# Patient Record
Sex: Male | Born: 1940
Health system: Southern US, Community
[De-identification: ages and names within clinical notes are randomized; demographics above are authoritative.]

## PROBLEM LIST (undated history)

## (undated) DIAGNOSIS — C801 Malignant (primary) neoplasm, unspecified: Secondary | ICD-10-CM

## (undated) DIAGNOSIS — I10 Essential (primary) hypertension: Secondary | ICD-10-CM

## (undated) DIAGNOSIS — J9383 Other pneumothorax: Secondary | ICD-10-CM

## (undated) DIAGNOSIS — I2510 Atherosclerotic heart disease of native coronary artery without angina pectoris: Secondary | ICD-10-CM

## (undated) DIAGNOSIS — N189 Chronic kidney disease, unspecified: Secondary | ICD-10-CM

## (undated) DIAGNOSIS — Z951 Presence of aortocoronary bypass graft: Secondary | ICD-10-CM

## (undated) HISTORY — DX: Other pneumothorax: J93.83

## (undated) HISTORY — DX: Presence of aortocoronary bypass graft: Z95.1

## (undated) HISTORY — PX: APPENDECTOMY: SHX54

---

## 1991-05-18 HISTORY — PX: KNEE SURGERY: SHX244

## 1999-05-18 HISTORY — PX: EYE SURGERY: SHX253

## 2001-05-17 HISTORY — PX: CORONARY ARTERY BYPASS GRAFT: SHX141

## 2001-09-20 ENCOUNTER — Encounter: Payer: Self-pay | Admitting: *Deleted

## 2001-09-20 ENCOUNTER — Inpatient Hospital Stay (HOSPITAL_COMMUNITY): Admission: EM | Admit: 2001-09-20 | Discharge: 2001-09-27 | Payer: Self-pay | Admitting: Emergency Medicine

## 2001-09-20 ENCOUNTER — Emergency Department (HOSPITAL_COMMUNITY): Admission: EM | Admit: 2001-09-20 | Discharge: 2001-09-20 | Payer: Self-pay | Admitting: *Deleted

## 2001-09-21 ENCOUNTER — Encounter: Payer: Self-pay | Admitting: Thoracic Surgery (Cardiothoracic Vascular Surgery)

## 2001-09-22 ENCOUNTER — Encounter: Payer: Self-pay | Admitting: Thoracic Surgery (Cardiothoracic Vascular Surgery)

## 2001-09-22 DIAGNOSIS — Z951 Presence of aortocoronary bypass graft: Secondary | ICD-10-CM

## 2001-09-22 HISTORY — DX: Presence of aortocoronary bypass graft: Z95.1

## 2001-09-23 ENCOUNTER — Encounter: Payer: Self-pay | Admitting: Thoracic Surgery (Cardiothoracic Vascular Surgery)

## 2001-09-24 ENCOUNTER — Encounter: Payer: Self-pay | Admitting: Surgery

## 2001-10-10 ENCOUNTER — Encounter: Payer: Self-pay | Admitting: Cardiology

## 2001-10-10 ENCOUNTER — Ambulatory Visit (HOSPITAL_COMMUNITY): Admission: RE | Admit: 2001-10-10 | Discharge: 2001-10-10 | Payer: Self-pay | Admitting: Cardiology

## 2001-10-23 ENCOUNTER — Encounter (HOSPITAL_COMMUNITY): Admission: RE | Admit: 2001-10-23 | Discharge: 2001-11-22 | Payer: Self-pay | Admitting: Cardiology

## 2001-11-23 ENCOUNTER — Encounter (HOSPITAL_COMMUNITY): Admission: RE | Admit: 2001-11-23 | Discharge: 2001-12-23 | Payer: Self-pay | Admitting: Cardiology

## 2001-12-25 ENCOUNTER — Encounter (HOSPITAL_COMMUNITY): Admission: RE | Admit: 2001-12-25 | Discharge: 2002-01-24 | Payer: Self-pay | Admitting: Cardiology

## 2002-12-12 ENCOUNTER — Encounter: Payer: Self-pay | Admitting: Emergency Medicine

## 2002-12-12 ENCOUNTER — Emergency Department (HOSPITAL_COMMUNITY): Admission: EM | Admit: 2002-12-12 | Discharge: 2002-12-12 | Payer: Self-pay | Admitting: Emergency Medicine

## 2003-11-03 ENCOUNTER — Emergency Department (HOSPITAL_COMMUNITY): Admission: EM | Admit: 2003-11-03 | Discharge: 2003-11-03 | Payer: Self-pay | Admitting: Emergency Medicine

## 2003-11-03 ENCOUNTER — Inpatient Hospital Stay (HOSPITAL_COMMUNITY): Admission: EM | Admit: 2003-11-03 | Discharge: 2003-11-05 | Payer: Self-pay | Admitting: *Deleted

## 2003-11-22 ENCOUNTER — Ambulatory Visit (HOSPITAL_COMMUNITY): Admission: RE | Admit: 2003-11-22 | Discharge: 2003-11-22 | Payer: Self-pay | Admitting: Internal Medicine

## 2003-12-02 ENCOUNTER — Ambulatory Visit (HOSPITAL_COMMUNITY): Admission: RE | Admit: 2003-12-02 | Discharge: 2003-12-02 | Payer: Self-pay | Admitting: General Surgery

## 2005-06-16 ENCOUNTER — Ambulatory Visit: Payer: Self-pay | Admitting: Internal Medicine

## 2005-06-16 ENCOUNTER — Ambulatory Visit (HOSPITAL_COMMUNITY): Admission: RE | Admit: 2005-06-16 | Discharge: 2005-06-16 | Payer: Self-pay | Admitting: Internal Medicine

## 2006-05-17 HISTORY — PX: ANKLE FRACTURE SURGERY: SHX122

## 2006-05-17 HISTORY — PX: CHOLECYSTECTOMY: SHX55

## 2006-06-08 ENCOUNTER — Ambulatory Visit: Payer: Self-pay | Admitting: Cardiology

## 2006-06-08 ENCOUNTER — Inpatient Hospital Stay (HOSPITAL_COMMUNITY): Admission: EM | Admit: 2006-06-08 | Discharge: 2006-06-09 | Payer: Self-pay | Admitting: Emergency Medicine

## 2006-06-28 ENCOUNTER — Ambulatory Visit: Payer: Self-pay | Admitting: Cardiology

## 2007-05-05 ENCOUNTER — Inpatient Hospital Stay (HOSPITAL_COMMUNITY): Admission: EM | Admit: 2007-05-05 | Discharge: 2007-05-07 | Payer: Self-pay | Admitting: Emergency Medicine

## 2008-11-04 DIAGNOSIS — I209 Angina pectoris, unspecified: Secondary | ICD-10-CM | POA: Insufficient documentation

## 2008-11-04 DIAGNOSIS — I2510 Atherosclerotic heart disease of native coronary artery without angina pectoris: Secondary | ICD-10-CM

## 2008-11-04 DIAGNOSIS — E119 Type 2 diabetes mellitus without complications: Secondary | ICD-10-CM

## 2009-09-15 ENCOUNTER — Emergency Department (HOSPITAL_COMMUNITY): Admission: EM | Admit: 2009-09-15 | Discharge: 2009-09-15 | Payer: Self-pay | Admitting: Emergency Medicine

## 2010-05-17 HISTORY — PX: PROSTATE SURGERY: SHX751

## 2010-05-17 HISTORY — PX: TRANSURETHRAL RESECTION OF PROSTATE: SHX73

## 2010-06-25 ENCOUNTER — Other Ambulatory Visit (HOSPITAL_COMMUNITY): Payer: Self-pay | Admitting: Internal Medicine

## 2010-06-25 DIAGNOSIS — R748 Abnormal levels of other serum enzymes: Secondary | ICD-10-CM

## 2010-06-29 ENCOUNTER — Ambulatory Visit (HOSPITAL_COMMUNITY)
Admission: RE | Admit: 2010-06-29 | Discharge: 2010-06-29 | Disposition: A | Discharge status: Home / Self Care | Payer: MEDICARE | Source: Ambulatory Visit | Attending: Internal Medicine | Admitting: Internal Medicine

## 2010-06-29 DIAGNOSIS — R944 Abnormal results of kidney function studies: Secondary | ICD-10-CM | POA: Insufficient documentation

## 2010-06-29 DIAGNOSIS — N1330 Unspecified hydronephrosis: Secondary | ICD-10-CM | POA: Insufficient documentation

## 2010-06-29 DIAGNOSIS — R748 Abnormal levels of other serum enzymes: Secondary | ICD-10-CM

## 2010-08-03 ENCOUNTER — Ambulatory Visit (HOSPITAL_COMMUNITY)
Admission: RE | Admit: 2010-08-03 | Discharge: 2010-08-03 | Disposition: A | Discharge status: Home / Self Care | Payer: MEDICARE | Source: Ambulatory Visit | Attending: Urology | Admitting: Urology

## 2010-08-03 ENCOUNTER — Other Ambulatory Visit (HOSPITAL_COMMUNITY): Payer: Self-pay | Admitting: Urology

## 2010-08-03 DIAGNOSIS — N400 Enlarged prostate without lower urinary tract symptoms: Secondary | ICD-10-CM

## 2010-08-03 DIAGNOSIS — Z951 Presence of aortocoronary bypass graft: Secondary | ICD-10-CM | POA: Insufficient documentation

## 2010-08-03 DIAGNOSIS — Z01818 Encounter for other preprocedural examination: Secondary | ICD-10-CM | POA: Insufficient documentation

## 2010-08-07 ENCOUNTER — Ambulatory Visit (HOSPITAL_BASED_OUTPATIENT_CLINIC_OR_DEPARTMENT_OTHER)
Admission: RE | Admit: 2010-08-07 | Discharge: 2010-08-08 | Disposition: A | Discharge status: Home / Self Care | Payer: MEDICARE | Source: Ambulatory Visit | Attending: Urology | Admitting: Urology

## 2010-08-07 DIAGNOSIS — N401 Enlarged prostate with lower urinary tract symptoms: Secondary | ICD-10-CM | POA: Insufficient documentation

## 2010-08-07 DIAGNOSIS — R339 Retention of urine, unspecified: Secondary | ICD-10-CM | POA: Insufficient documentation

## 2010-08-07 DIAGNOSIS — E119 Type 2 diabetes mellitus without complications: Secondary | ICD-10-CM | POA: Insufficient documentation

## 2010-08-07 DIAGNOSIS — Z01810 Encounter for preprocedural cardiovascular examination: Secondary | ICD-10-CM | POA: Insufficient documentation

## 2010-08-07 DIAGNOSIS — N138 Other obstructive and reflux uropathy: Secondary | ICD-10-CM | POA: Insufficient documentation

## 2010-08-07 DIAGNOSIS — Z951 Presence of aortocoronary bypass graft: Secondary | ICD-10-CM | POA: Insufficient documentation

## 2010-08-07 DIAGNOSIS — Z87891 Personal history of nicotine dependence: Secondary | ICD-10-CM | POA: Insufficient documentation

## 2010-08-07 DIAGNOSIS — Z79899 Other long term (current) drug therapy: Secondary | ICD-10-CM | POA: Insufficient documentation

## 2010-08-07 DIAGNOSIS — Z01812 Encounter for preprocedural laboratory examination: Secondary | ICD-10-CM | POA: Insufficient documentation

## 2010-08-07 DIAGNOSIS — I2510 Atherosclerotic heart disease of native coronary artery without angina pectoris: Secondary | ICD-10-CM | POA: Insufficient documentation

## 2010-08-07 DIAGNOSIS — I10 Essential (primary) hypertension: Secondary | ICD-10-CM | POA: Insufficient documentation

## 2010-08-07 LAB — GLUCOSE, CAPILLARY
Glucose-Capillary: 83 mg/dL (ref 70–99)
Glucose-Capillary: 105 mg/dL — ABNORMAL HIGH (ref 70–99)

## 2010-08-07 LAB — POCT I-STAT 4, (NA,K, GLUC, HGB,HCT)
Glucose, Bld: 92 mg/dL (ref 70–99)
HCT: 36 % — ABNORMAL LOW (ref 39.0–52.0)
Hemoglobin: 12.2 g/dL — ABNORMAL LOW (ref 13.0–17.0)
Potassium: 5.3 mEq/L — ABNORMAL HIGH (ref 3.5–5.1)
Sodium: 141 mEq/L (ref 135–145)

## 2010-08-08 LAB — GLUCOSE, CAPILLARY
Glucose-Capillary: 151 mg/dL — ABNORMAL HIGH (ref 70–99)
Glucose-Capillary: 174 mg/dL — ABNORMAL HIGH (ref 70–99)

## 2010-08-25 NOTE — Op Note (Signed)
NAMEJOVANTE, HAMMITT NO.:  [id number]  MEDICAL RECORD NO.:  [id number]           PATIENT TYPE:  LOCATION:                                 FACILITY:  PHYSICIAN:  Caryl Fate C. Vernie Ammons, M.D.  DATE OF BIRTH:  August 27, 1940  DATE OF PROCEDURE:  08/04/2010 DATE OF DISCHARGE:                              OPERATIVE REPORT   PREOPERATIVE DIAGNOSES: 1. Benign prostatic hypertrophy. 2. Acute urinary retention.  POSTOPERATIVE DIAGNOSES: 1. Benign prostatic hypertrophy. 2. Acute urinary retention.  PROCEDURE:  Transurethral resection of prostate using VaporTrode.  SURGEON:  Jessyka Austria C. Vernie Ammons, M.D.  ANESTHESIA:  General.  BLOOD LOSS:  Minimal.  DRAINS:  22-French three-way Foley catheter.  SPECIMENS:  None.  COMPLICATIONS:  None.  INDICATIONS:  The patient is a 70 year old male who developed acute urinary retention.  He was managed conservatively with medical management and Foley catheterization with voiding trials but wasunsuccessful at voiding spontaneously.  He underwent urodynamics which revealed detrusor contractility, was normal to slightly decreased with evidence of outlet obstruction and high detrusor pressures as well as grade 4 vesicoureteral reflux on the right and a right-sided pseudodiverticulum.  We discussed the treatment options and he has elected to proceed with a transurethral resection of his prostate.  DESCRIPTION OF OPERATION:  After informed consent, the patient was brought to the major OR, placed on the table, administered general anesthesia and then moved to the dorsal lithotomy position.  His genitalia was then sterilely prepped and draped and an official time-out was then performed.  The 26-French resectoscope sheath with Timberlake obturator was then passed into the bladder and the obturator was removed.  The bladder was then fully and systematically inspected.  No tumor, stones, or inflammatory lesions were seen.  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  I began by resecting at the 6 o'clock position and resected what appeared to be a fairly high bladder neck with minimal median lobe tissue.  This was resected back to the level of the verumontanum.  I then resected from the 6 o'clock to 12 o'clock position in a counterclockwise fashion, resecting down to surgical capsule first on the left-hand side.  This procedure was then repeated on the right-hand side.  I then resected apical tissue with care being taken to remain proximal to verumontanum at all times and then resected tissue at the 12 o'clock position.  At the end of the procedure, his prostatic fossa was widely open with no prostatic capsular perforation or active bleeding. There was no prostatic material seen within the bladder.  The ureteral orifices were noted to be well away from the area of resection of the bladder neck.  I, therefore, removed the resectoscope and then inserted the catheter.  This was connected to close system drainage and continuous irrigation.  The patient was awakened and taken to recovery room in stable and satisfactory condition.  He tolerated procedure well with no intraoperative complications.  He will be observed overnight and discharged home with a prescription for 28 Vicodin HP, 30 Pyridium 200 mg and Cipro 500 mg b.i.d. for 5 days.     Loraine Leriche  [name] Salina, M.D.     MCO/MEDQ  D:  08/07/2010  T:  08/07/2010  Job:  [id number]  Electronically Signed by Ihor Gully M.D. on 08/25/2010 06:49:35 AM

## 2010-09-09 ENCOUNTER — Inpatient Hospital Stay (HOSPITAL_COMMUNITY)
Admission: EM | Admit: 2010-09-09 | Discharge: 2010-09-13 | DRG: 201 | Disposition: A | Discharge status: Home / Self Care | Payer: Medicare Other | Attending: General Surgery | Admitting: General Surgery

## 2010-09-09 ENCOUNTER — Encounter (HOSPITAL_COMMUNITY): Payer: Self-pay | Admitting: Radiology

## 2010-09-09 ENCOUNTER — Emergency Department (HOSPITAL_COMMUNITY): Payer: Medicare Other

## 2010-09-09 DIAGNOSIS — E785 Hyperlipidemia, unspecified: Secondary | ICD-10-CM | POA: Diagnosis present

## 2010-09-09 DIAGNOSIS — J9383 Other pneumothorax: Principal | ICD-10-CM | POA: Diagnosis present

## 2010-09-09 DIAGNOSIS — I2510 Atherosclerotic heart disease of native coronary artery without angina pectoris: Secondary | ICD-10-CM | POA: Diagnosis present

## 2010-09-09 DIAGNOSIS — E119 Type 2 diabetes mellitus without complications: Secondary | ICD-10-CM | POA: Diagnosis present

## 2010-09-09 DIAGNOSIS — I10 Essential (primary) hypertension: Secondary | ICD-10-CM | POA: Diagnosis present

## 2010-09-09 DIAGNOSIS — Z951 Presence of aortocoronary bypass graft: Secondary | ICD-10-CM

## 2010-09-09 HISTORY — DX: Essential (primary) hypertension: I10

## 2010-09-09 LAB — COMPREHENSIVE METABOLIC PANEL
Albumin: 3.8 g/dL (ref 3.5–5.2)
Calcium: 9.3 mg/dL (ref 8.4–10.5)
Chloride: 104 mEq/L (ref 96–112)
Creatinine, Ser: 1.84 mg/dL — ABNORMAL HIGH (ref 0.4–1.5)
GFR calc non Af Amer: 37 mL/min — ABNORMAL LOW (ref >60)
Glucose, Bld: 159 mg/dL — ABNORMAL HIGH (ref 70–99)
Potassium: 5.1 mEq/L (ref 3.5–5.1)
Sodium: 136 mEq/L (ref 135–145)
Total Protein: 7.6 g/dL (ref 6.0–8.3)

## 2010-09-09 LAB — DIFFERENTIAL
Basophils Absolute: 0 10*3/uL (ref 0.0–0.1)
Eosinophils Relative: 4 % (ref 0–5)
Lymphocytes Relative: 20 % (ref 12–46)
Neutro Abs: 5.1 10*3/uL (ref 1.7–7.7)
Neutrophils Relative %: 68 % (ref 43–77)

## 2010-09-09 LAB — POCT CARDIAC MARKERS: Myoglobin, poc: 123 ng/mL (ref 12–200)

## 2010-09-09 LAB — GLUCOSE, CAPILLARY
Glucose-Capillary: 123 mg/dL — ABNORMAL HIGH (ref 70–99)
Glucose-Capillary: 178 mg/dL — ABNORMAL HIGH (ref 70–99)

## 2010-09-09 LAB — CBC
HCT: 41.3 % (ref 39.0–52.0)
Hemoglobin: 13.9 g/dL (ref 13.0–17.0)
MCH: 28.2 pg (ref 26.0–34.0)
MCHC: 33.7 g/dL (ref 30.0–36.0)

## 2010-09-10 ENCOUNTER — Inpatient Hospital Stay (HOSPITAL_COMMUNITY): Payer: Medicare Other

## 2010-09-10 LAB — GLUCOSE, CAPILLARY
Glucose-Capillary: 171 mg/dL — ABNORMAL HIGH (ref 70–99)
Glucose-Capillary: 200 mg/dL — ABNORMAL HIGH (ref 70–99)

## 2010-09-11 ENCOUNTER — Inpatient Hospital Stay (HOSPITAL_COMMUNITY): Payer: Medicare Other

## 2010-09-11 LAB — GLUCOSE, CAPILLARY
Glucose-Capillary: 168 mg/dL — ABNORMAL HIGH (ref 70–99)
Glucose-Capillary: 153 mg/dL — ABNORMAL HIGH (ref 70–99)
Glucose-Capillary: 174 mg/dL — ABNORMAL HIGH (ref 70–99)

## 2010-09-12 ENCOUNTER — Inpatient Hospital Stay (HOSPITAL_COMMUNITY): Payer: Medicare Other

## 2010-09-12 LAB — GLUCOSE, CAPILLARY
Glucose-Capillary: 328 mg/dL — ABNORMAL HIGH (ref 70–99)
Glucose-Capillary: 142 mg/dL — ABNORMAL HIGH (ref 70–99)
Glucose-Capillary: 193 mg/dL — ABNORMAL HIGH (ref 70–99)

## 2010-09-12 NOTE — H&P (Signed)
  [name], [name] NO.:  [id number]  MEDICAL RECORD NO.:  [id number]           PATIENT TYPE:  I  LOCATION:  A317                          FACILITY:  [facility]  PHYSICIAN:  Dalia Heading, M.D.  DATE OF BIRTH:  08-Mar-1941  DATE OF ADMISSION:  09/09/2010 DATE OF DISCHARGE:  LH                             HISTORY & PHYSICAL   CHIEF COMPLAINT:  Right pneumothorax.  HISTORY OF PRESENT ILLNESS:  The patient is a 70 year old white male who is in his usual state of health who has been complaining of chest pain, shortness of breath over the past 3 days with productive cough.  No fever or chills have been noted.  He presented to the emergency room and a chest x-ray was performed which revealed a 50% right pneumothorax. This was spontaneous in nature.  He has never had a collapsed lung before.  PAST MEDICAL HISTORY:  Coronary artery disease, non-insulin-dependent diabetes mellitus, and hypertension.  PAST SURGICAL HISTORY:  Prostatectomy last year, CABG, and laparoscopic cholecystectomy.  CURRENT MEDICATIONS:  Metoprolol 1 tablet p.o. daily, Actos 45 mg p.o. daily, lisinopril 20 mg p.o. daily, glyburide 2 mg p.o. daily, metformin 1 gram p.o. daily  ALLERGIES:  No known drug allergies.  REVIEW OF SYSTEMS:  The patient denies drinking or smoking.  He denies any drug use.  He denies any recent MI, CVA, or bleeding disorders.  FAMILY MEDICAL HISTORY:  Noncontributory.  PHYSICAL EXAMINATION:  GENERAL:  The patient is a well-developed, well- nourished white male in no acute distress.  He is afebrile.  VITAL SIGNS:  Stable.  Oxygen saturation on room air was 97%.  His blood pressure is noted be slightly elevated at 150/83 with a pulse of 80. HEENT:  Unremarkable. LUNGS:  Clear to auscultation with decreased breath sounds noted on the right side.  No rales or rhonchi are noted.  HEART:  Regular rate and rhythm without S3, S4, or murmurs. ABDOMEN:   Unremarkable.  Chest x-ray reveals a 50% right pneumothorax.  CBC is within normal limits.  PROCEDURE NOTE:  A small caliber right chest tube was placed in the emergency room without difficulty.  He tolerated the procedure well. Final chest x-ray is pending.  IMPRESSION:  Spontaneous right pneumothorax.  PLAN:  The patient will require a 3-day stay in the hospital to monitor the chest tube.  Should the lung not fully re-expanded or continued to be leaking air, Thoracic Surgery will be consulted at [facility].  This has been explained to the patient who understands and agrees to the treatment plan.     Dalia Heading, M.D.     MAJ/MEDQ  D:  09/09/2010  T:  09/10/2010  Job:  [id number]  cc:   Kingsley Callander. Ouida Sills, MD Fax: [phone]  Electronically Signed by Franky Macho M.D. on 09/12/2010 08:08:45 AM

## 2010-09-13 ENCOUNTER — Inpatient Hospital Stay (HOSPITAL_COMMUNITY): Payer: Medicare Other

## 2010-09-13 LAB — GLUCOSE, CAPILLARY

## 2010-09-15 ENCOUNTER — Ambulatory Visit (HOSPITAL_COMMUNITY)
Admission: RE | Admit: 2010-09-15 | Discharge: 2010-09-15 | Disposition: A | Discharge status: Home / Self Care | Payer: Medicare Other | Source: Ambulatory Visit | Attending: General Surgery | Admitting: General Surgery

## 2010-09-15 ENCOUNTER — Other Ambulatory Visit (HOSPITAL_COMMUNITY): Payer: Self-pay | Admitting: General Surgery

## 2010-09-15 ENCOUNTER — Inpatient Hospital Stay (HOSPITAL_COMMUNITY)
Admission: EM | Admit: 2010-09-15 | Discharge: 2010-09-18 | DRG: 201 | Disposition: A | Discharge status: Home / Self Care | Payer: Medicare Other | Attending: Cardiothoracic Surgery | Admitting: Cardiothoracic Surgery

## 2010-09-15 ENCOUNTER — Emergency Department (HOSPITAL_COMMUNITY): Payer: Medicare Other

## 2010-09-15 DIAGNOSIS — Z09 Encounter for follow-up examination after completed treatment for conditions other than malignant neoplasm: Secondary | ICD-10-CM | POA: Insufficient documentation

## 2010-09-15 DIAGNOSIS — J9383 Other pneumothorax: Secondary | ICD-10-CM | POA: Insufficient documentation

## 2010-09-15 DIAGNOSIS — I2510 Atherosclerotic heart disease of native coronary artery without angina pectoris: Secondary | ICD-10-CM | POA: Diagnosis present

## 2010-09-15 DIAGNOSIS — I10 Essential (primary) hypertension: Secondary | ICD-10-CM | POA: Diagnosis present

## 2010-09-15 DIAGNOSIS — Z87891 Personal history of nicotine dependence: Secondary | ICD-10-CM

## 2010-09-15 DIAGNOSIS — N400 Enlarged prostate without lower urinary tract symptoms: Secondary | ICD-10-CM | POA: Diagnosis present

## 2010-09-15 DIAGNOSIS — E119 Type 2 diabetes mellitus without complications: Secondary | ICD-10-CM | POA: Diagnosis present

## 2010-09-15 DIAGNOSIS — Z9861 Coronary angioplasty status: Secondary | ICD-10-CM

## 2010-09-15 DIAGNOSIS — Z7982 Long term (current) use of aspirin: Secondary | ICD-10-CM

## 2010-09-15 DIAGNOSIS — J930 Spontaneous tension pneumothorax: Secondary | ICD-10-CM

## 2010-09-15 HISTORY — PX: CHEST TUBE INSERTION: SHX231

## 2010-09-15 HISTORY — DX: Other pneumothorax: J93.83

## 2010-09-16 DIAGNOSIS — J930 Spontaneous tension pneumothorax: Secondary | ICD-10-CM

## 2010-09-16 LAB — GLUCOSE, CAPILLARY
Glucose-Capillary: 122 mg/dL — ABNORMAL HIGH (ref 70–99)
Glucose-Capillary: 159 mg/dL — ABNORMAL HIGH (ref 70–99)
Glucose-Capillary: 141 mg/dL — ABNORMAL HIGH (ref 70–99)
Glucose-Capillary: 144 mg/dL — ABNORMAL HIGH (ref 70–99)

## 2010-09-16 NOTE — Discharge Summary (Signed)
  NAME:  [name], [name] NO.:  [id number]  MEDICAL RECORD NO.:  [id number]           PATIENT TYPE:  I  LOCATION:  A317                          FACILITY:  [facility]  PHYSICIAN:  Dalia Heading, M.D.  DATE OF BIRTH:  May 28, 1940  DATE OF ADMISSION:  09/09/2010 DATE OF DISCHARGE:  LH                              DISCHARGE SUMMARY   HOSPITAL COURSE SUMMARY:  The patient is a 70 year old white male who presented to the emergency room with a 3-4 day history of worsening right-sided chest pain.  The chest x-ray revealed a spontaneous right pneumothorax.  Surgery was consulted and a small-bore chest tube was placed.  He was then admitted to hospital for further evaluation and treatment.  He was followed daily with chest x-rays.  Ultimately, his right pneumothorax resolved and the chest tube was removed.  The patient is being discharged home on September 13, 2010, in good improving condition.  DISCHARGE INSTRUCTIONS:  The patient is to follow up Dr. Franky Macho for a chest x-ray on Sep 17, 2010.  DISCHARGE MEDICATIONS: 1. Actos 45 mg p.o. daily. 2. Baby aspirin 81 mg p.o. daily. 3. Gemfibrozil 600 mg p.o. b.i.d. 4. Amaryl 2 mg p.o. nightly. 5. Lisinopril 20 mg p.o. nightly. 6. Metformin 1 g p.o. b.i.d. 7. Metoprolol 100 mg p.o. b.i.d.  PRINCIPAL DIAGNOSES: 1. Spontaneous right pneumothorax, resolved. 2. Non-insulin-dependent diabetes mellitus. 3. Hypertension. 4. Hyperlipidemia.  PRINCIPAL PROCEDURE:  Chest tube placement on September 09, 2010.     Dalia Heading, M.D.     MAJ/MEDQ  D:  09/13/2010  T:  09/13/2010  Job:  [id number]  cc:   Kingsley Callander. Ouida Sills, MD Fax: [phone]  Electronically Signed by Franky Macho M.D. on 09/16/2010 02:28:55 PM

## 2010-09-17 ENCOUNTER — Inpatient Hospital Stay (HOSPITAL_COMMUNITY): Payer: Medicare Other

## 2010-09-17 DIAGNOSIS — J930 Spontaneous tension pneumothorax: Secondary | ICD-10-CM

## 2010-09-17 LAB — GLUCOSE, CAPILLARY
Glucose-Capillary: 125 mg/dL — ABNORMAL HIGH (ref 70–99)
Glucose-Capillary: 156 mg/dL — ABNORMAL HIGH (ref 70–99)
Glucose-Capillary: 138 mg/dL — ABNORMAL HIGH (ref 70–99)
Glucose-Capillary: 161 mg/dL — ABNORMAL HIGH (ref 70–99)

## 2010-09-18 ENCOUNTER — Inpatient Hospital Stay (HOSPITAL_COMMUNITY): Payer: Medicare Other

## 2010-09-18 LAB — GLUCOSE, CAPILLARY
Glucose-Capillary: 120 mg/dL — ABNORMAL HIGH (ref 70–99)
Glucose-Capillary: 225 mg/dL — ABNORMAL HIGH (ref 70–99)

## 2010-09-21 NOTE — Op Note (Signed)
  NAMECADE, DASHNER NO.:  [id number]  MEDICAL RECORD NO.:  [id number]           PATIENT TYPE:  I  LOCATION:  2011                         FACILITY:  [facility]  PHYSICIAN:  Sheliah Plane, MD    DATE OF BIRTH:  [birth year]  DATE OF PROCEDURE:  09/15/2010 DATE OF DISCHARGE:                              OPERATIVE REPORT   PREOPERATIVE DIAGNOSIS:  Right apical pneumothorax with subcutaneous air.  POSTOPERATIVE DIAGNOSIS:  Right apical pneumothorax with subcutaneous air.  SURGICAL PROCEDURE:  Placement of right anterior apical chest tube for pneumothorax.  BRIEF HISTORY:  The patient is a 70 year old male who approximately a week ago developed a spontaneous pneumothorax on the right with a 50% collapse.  A small chest tube was placed by Dr. Lovell Sheehan in [place] and the patient was hospitalized for 5 days with almost complete resolution of pneumothorax with a small apical cap.  He was discharged home 2 days ago doing well, returned to the office today for a followup x-ray and had a recurrent approximately 20% apical pneumo with increase in subcu air.  Because of this, it was suggested to him to come to the emergency room and he was seen.  Film from this afternoon was reviewed and it was decided to place a right apical chest tube.  Time-out was performed with the patient and the nurse.  Side confirmed from his x- rays.  He was given IV Ativan and morphine.  The right upper chest was prepped with Hibiclens and draped sterilely.  A small incision was made approximately in the mid clavicular line at the third interspace and 28- trocar chest tube was placed toward the apex of the lung.  The patient tolerated the procedure without obvious complication and a followup chest x-ray is pending.     Sheliah Plane, MD     EG/MEDQ  D:  09/15/2010  T:  09/16/2010  Job:  [id number]  Electronically Signed by Sheliah Plane MD on 09/21/2010 12:25:16 PM

## 2010-09-23 ENCOUNTER — Other Ambulatory Visit: Payer: Self-pay | Admitting: Cardiothoracic Surgery

## 2010-09-23 DIAGNOSIS — J930 Spontaneous tension pneumothorax: Secondary | ICD-10-CM

## 2010-09-24 ENCOUNTER — Encounter (INDEPENDENT_AMBULATORY_CARE_PROVIDER_SITE_OTHER): Payer: Medicare Other | Admitting: Cardiothoracic Surgery

## 2010-09-24 ENCOUNTER — Ambulatory Visit
Admission: RE | Admit: 2010-09-24 | Discharge: 2010-09-24 | Disposition: A | Discharge status: Home / Self Care | Payer: Medicare Other | Source: Ambulatory Visit | Attending: Cardiothoracic Surgery | Admitting: Cardiothoracic Surgery

## 2010-09-24 DIAGNOSIS — J930 Spontaneous tension pneumothorax: Secondary | ICD-10-CM

## 2010-09-25 NOTE — Assessment & Plan Note (Signed)
OFFICE VISIT  IOANE, BHOLA DOB:  24-Jul-1940                                        Sep 24, 2010 CHART #:  [id number]  The patient returns to the office today in followup after his recent hospital admission for a right pneumothorax.  He had been treated at [facility], for a pneumothorax, small chest tube was placed.  The tube was removed the following day.  He had approximately 20% apical pneumothorax with subcutaneous air and was presented to the [facility] Emergency Room.  An anterior apical chest tube was placed with complete resolution of the pneumothorax and left in for several days and ultimately, the patient was discharged home.  He returns today with a followup chest x-ray, he notes that he has had no chest pain or shortness of breath, never filled his pain medication.  On exam today, his blood pressure 107/70, pulse is 70, respiratory rate is 20, O2 sats 99% on room air.  The chest tube stitch was removed from the right anterior chest tube site which was well healed without evidence of infection.  His lungs are clear bilaterally without wheezing.  He has no calf tenderness.  Follow up chest x-ray shows complete resolution of pneumothorax without recurrence.  At this point, the patient is a non nonsmoker, which is again confirmed of suggestive that he limit his strenuous activities for several weeks, but could return to driving and usual daily activities. I have not made him a return appointment to see him, but have reviewed the signs and symptoms of recurrent pneumothorax and suggested to him if he has a recurrence then most likely we would recommend VATS.  Sheliah Plane, MD Electronically Signed  EG/MEDQ  D:  09/24/2010  T:  09/25/2010  Job:  [id number]  cc:   Kingsley Callander. Ouida Sills, MD Dalia Heading, M.D.

## 2010-09-25 NOTE — H&P (Signed)
[name], [name] NO.:  [id number]  MEDICAL RECORD NO.:  [id number]           PATIENT TYPE:  I  LOCATION:  2011                         FACILITY:  [facility]  PHYSICIAN:  Anthony Plane, MD    DATE OF BIRTH:  28-Feb-1941  DATE OF ADMISSION:  09/15/2010 DATE OF DISCHARGE:                             HISTORY & PHYSICAL   CHIEF COMPLAINT:  Shortness of breath.  HISTORY OF PRESENT ILLNESS:  The patient is a 70 year old Caucasian male who has a history of coronary artery disease, status post coronary artery bypass grafting x4, done by Dr. Cornelius Owen on Sep 22, 2001.  The patient also has a past medical history for diabetes mellitus, hypertension, and history of tobacco use.  The patient recently presented to [facility] Emergency Room on September 09, 2010, with complaints of shortness of breath, chest pain and coughing.  Chest x-ray obtained at that time showed a 50% right pneumothorax.  Right chest tube was placed at that time and the patient was admitted to [facility] by Dr. Lovell Owen.  His right pneumothorax resolved, chest tube was removed and the patient was discharged to home on September 13, 2010.  The patient was seen at Dr. Lovell Owen' office on Sep 15, 2010 for followup visit with chest x-ray.  At this time, chest x-ray showed a recurrent right pneumothorax 20% with increase in subcutaneous air.  At that time, the patient was told to present to [facility] Emergency Room for further management.  The patient states that he did feel mild short of breath, but nothing like his initial presentation on September 09, 2010.  He denies any chest pain, coughing, hemoptysis, wheezing, or fevers.  PAST MEDICAL HISTORY: 1. History of right pneumothorax as described above. 2. Hypertension. 3. Diabetes mellitus, non-insulin dependent. 4. History of BPH. 5. History of coronary artery disease.  PAST SURGICAL HISTORY: 1. Status post cholecystectomy. 2. Status post prostatectomy. 3. Status  post surgery of left ankle. 4. Status post coronary artery bypass grafting x4, done by Dr. Cornelius Owen on     Sep 22, 2001. 5. Status post placement of right chest tube.  ALLERGIES:  No known drug allergies.  HOME MEDICATIONS: 1. Actos 45 mg daily. 2. Aspirin 81 mg daily. 3. Gemfibrozil  600 mg b.i.d. 4. Amaryl 2 mg at night. 5. Lisinopril 20 mg daily. 6. Metformin 1000 mg b.i.d. 7. Metoprolol 100 mg b.i.d.  SOCIAL HISTORY:  The patient is married, lives at home with his wife, has three grown children.  He quit smoking in the 1990s.  He is currently retired.  FAMILY HISTORY:  Noncontributory.  REVIEW OF SYSTEMS:  See HPI for pertinent positives and negatives.  The patient denies any recent fevers, night sweats, or chills.  He denies any recent weight loss.  Denies any recent headaches, changes in vision, difficulty hearing, or swallowing.  Denies any nausea, vomiting, abdominal pain, changes in bowel movements, diarrhea, or constipation. Denies any urgency, frequency, dysuria, or hematuria.  Denies any muscle aches or pains or claudication symptoms.  Denies any slurred speech, muscle weakness.  PHYSICAL EXAMINATION:  VITAL SIGNS:  Blood pressure 140/77, pulse of 74, temperature of 96.9, O2 sats 100% on 4 liters. GENERAL:  The patient is a 70 year old male in no acute distress. HEENT:  Normocephalic and atraumatic.  Pupils are equal, round, and reactive to light and accommodation.  Extraocular movements are intact. Oral mucosa is pink and moist. NECK:  Supple. RESPIRATORY:  Diminished breath sounds on the right.  Positive crackles on the right.  Positive subcu air noted on right chest. CARDIAC:  Regular rate and rhythm.  No murmurs noted. ABDOMEN:  Bowel sounds x4.  Soft and nontender. GENITOURINARY:  Deferred. RECTAL:  Deferred. EXTREMITIES:  No edema or cyanosis noted.  The patient's extremities are warm and well perfused.  2+ bilateral radial, DP and PT pulses  noted. NEUROLOGIC:  The patient is alert and oriented x4.  Muscle strength 5/5 upper and lower extremities bilaterally.  STUDIES:  The patient had AP and lateral chest x-ray done on Sep 15, 2010, which shows a moderate-sized right pneumothorax per Radiology report.  IMPRESSION AND PLAN:  The patient seen with a recurrent right pneumothorax.  He was seen and evaluated by Dr. Tyrone Owen.  We will plan to insert a right chest tube on the emergency room and admit the patient to [facility] for further management.  We will continue the patient on current medications and monitor vital signs as well as blood sugars.  Dr. Tyrone Owen did discuss with the patient risks and benefits of placing a right chest tube.  The patient acknowledged understanding and agreed to proceed.     Anthony Blazing, PA   ______________________________ Anthony Plane, MD   KMD/MEDQ  D:  09/16/2010  T:  09/17/2010  Job:  [id number]  Electronically Signed by Cameron Proud PA on 09/23/2010 11:20:09 AM Electronically Signed by Anthony Plane MD on 09/25/2010 09:42:00 AM

## 2010-09-25 NOTE — Discharge Summary (Signed)
NAMEJUVENCIO, VERDI NO.:  [id number]  MEDICAL RECORD NO.:  [id number]           PATIENT TYPE:  I  LOCATION:  2011                         FACILITY:  [facility]  PHYSICIAN:  Sheliah Plane, MD    DATE OF BIRTH:  04-10-1941  DATE OF ADMISSION:  09/15/2010 DATE OF DISCHARGE:                              DISCHARGE SUMMARY   FINAL DIAGNOSIS:  Recurrent right pneumothorax.  SECONDARY DIAGNOSES: 1. History of right pneumothorax, status post right chest tube     placement. 2. Hypertension. 3. Diabetes mellitus, non-insulin dependent. 4. History of benign prostatic hypertrophy. 5. History of coronary artery disease, status post coronary artery     bypass grafting x4 done by Dr. Cornelius Moras on Sep 22, 2001. 6. Status post cholecystectomy. 7. Status post prostatectomy. 8. Status post surgery of left ankle.  IN-HOSPITAL OPERATIONS AND PROCEDURES:  Placement of a 28-French right anterior apical chest tube. HISTORY AND PHYSICAL AND HOSPITAL COURSE:  The patient is a 70 year old Caucasian male who has a history of coronary artery disease, status post coronary artery bypass grafting x4 done by Dr. Cornelius Moras on Sep 22, 2001.  The patient also has a past medical history for diabetes, hypertension, history of tobacco use.  The patient recently presented to [facility] Emergency Room on September 09, 2010, with complaints of shortness of breath, chest pain and coughing.  He was found to have a 50% right pneumothorax and was admitted for management.  Right chest tube was placed.  This eventually resolved.  Chest tube was discontinued and the patient was discharged to home on September 13, 2010.  The patient was seen at Dr. Lovell Sheehan' office for followup on Sep 15, 2010 with a chest x-ray which showed recurrent right pneumothorax of 20% with increasing subcutaneous air.  At that time, the patient was told to come to [facility] for further management.  For further details of the patient's past  medical history and physical exam, please see dictated H and P.  Dr. Tyrone Sage saw the patient in the emergency room on Sep 15, 2010.  The patient had a 20% apical pneumothorax with increasing subcu air.  Dr. Tyrone Sage discussed with the patient and replacement of right chest tube. He discussed risks and benefits with the patient.  The patient acknowledged understanding and agreed to proceed.  Right chest tube was placed a 28-French at that time.  The patient was admitted to [facility] following.  During the patient's postoperative course, daily chest x-rays were obtained.  Chest x-ray following placement of chest tube showed re-expansion of the right lung with no visible pneumothorax. The patient was placed on suction following placement chest tube.  No air leak was noted.  The patient's chest tube was switched to water seal on Sep 16, 2010.  Followup chest x-ray on Sep 17, 2010, showed no pneumothorax.  No air leak was noted at this time and right chest tube was discontinued.  We will plan to obtain PA and lateral chest x-ray in the a.m. for further follow up.  During this time, the patient has remained afebrile in normal sinus  rhythm.  Blood pressure is stable.  He has been able to be weaned off oxygen with O2 saturations maintaining greater than 90% on room air.  On admission, the patient had been restarted on his diabetes as well as hypertensive medicines.  His blood sugars were followed and have remained stable.  The patient's right chest tube site is clean, dry and intact and healing well.  The patient is up ambulating well without difficulty.  He is tolerating diet well. No nausea, vomiting noted.  The patient is tentatively ready for discharge to home in the a.m. Sep 18, 2010, pending followup chest x-ray stable.  FOLLOWUP APPOINTMENTS:  Followup appointment has been arranged with Dr. Tyrone Sage for Sep 24, 2010, at 12 o'clock p.m..  The patient will need to obtain PA and  lateral chest x-ray 45 minutes prior to this appointment.  ACTIVITY:  Patient instructed no driving until he is released to do so. No heavy lifting over 10 pounds.  He is told to ambulate 3-4 times per day, progress as tolerated and continue his breathing exercises.  INCISIONAL CARE:  The patient is told to shower, washing his incisions using soap and water.  He is to contact the office if he develops any drainage or opening from any of his incision sites.  DIET:  The patient was educated on diet to be low-fat, low-salt well as diabetic diet.  DISCHARGE MEDICATIONS: 1. Vicodin 5/325 one to two tablets q.4 h. p.r.n. pain. 2. Actos 45 mg daily. 3. Aspirin enteric coated 81 mg daily. 4. Gemfibrozil 600 mg b.i.d. 5. Amaryl 2 mg at night. 6. Lisinopril 20 mg at night. 7. Metformin 1000 mg b.i.d. 8. Metoprolol 100 mg b.i.d.     Sol Blazing, PA   ______________________________ Sheliah Plane, MD    KMD/MEDQ  D:  09/17/2010  T:  09/17/2010  Job:  [id number]  Electronically Signed by Cameron Proud PA on 09/23/2010 11:20:16 AM Electronically Signed by Sheliah Plane MD on 09/25/2010 09:42:05 AM

## 2010-09-29 NOTE — Op Note (Signed)
NAME:  [name], [name] NO.:  [id number]   MEDICAL RECORD NO.:  [id number]          PATIENT TYPE:  AMB   LOCATION:  ED                            FACILITY:  [facility]   PHYSICIAN:  J. Darreld Mclean, M.D. DATE OF BIRTH:  1940/10/25   DATE OF PROCEDURE:  05/05/2007  DATE OF DISCHARGE:                               OPERATIVE REPORT   PREOPERATIVE DIAGNOSIS:  Open bimalleolar fracture of left ankle.   POSTOPERATIVE DIAGNOSIS:  Open bimalleolar fracture of left ankle.   PROCEDURE:  Debridement, left open fracture of the ankle.  Open  treatment internal reduction of the left ankle fracture. using Synthes  compression plate and screws.  Debridement with Water-Pik and saline and  sharp dissection.   ANESTHESIA:  Spinal.   TOURNIQUET TIME:  42 minutes.   SURGEON:  J. Darreld Mclean, M.D.   INDICATIONS:  The patient is a 70 year old male hit by a tow motor at  work, and sustained an open fracture laterally to his ankle.  It was a  grade 1 type fracture, open.  There was minimal displacement of the  fracture fragments.  A small piece of the lateral malleolus was missing.   We discussed with the patient the seriousness of the injury and the  urgent need for debridement.  Surgery was delayed because there was  another emergency; a viscera appendectomy that took precedence.  We  explained to the patient the risks and imponderables of the procedure,  particularly postoperative infection as a possibility plus pulmonary  embolism, need for physical therapy, limited weightbearing, crutches or  a walker.  He appeared to understand and agreed to the procedure as  outlined.  He understands he will need to be on postoperative IV  antibiotics for several days.   DESCRIPTION OF PROCEDURE:  The patient was seen in the ER area, where  his history and physical had been done by me.  The appropriate papers  had been signed, marked the left leg as the ankle that was the affected  site, and  so did he.  He was brought to the operating room and given  spinal anesthesia.  A tourniquet was placed and deflated.  The left  upper thigh was prepped and draped the usual manner.  A time-out  identifying Mr. Hora as the patient, and the left ankle and foot as  the surgical site.  Tourniquet was elevated after the Esmarch had been  exsanguinated at the leg; __________  removed.  I used the Water-Pik  lavage system and irrigated the open fracture area after making a small  opening in the wound.  We then did 3 liters of saline in the Water-Pik  lavage-type system.  This was then cleansed and the Water-Pik removed.  The medial malleolus was examined and held anatomically in place.  A  0.062 Kirschner wire was placed, and then a 3.2 drill bit and a 4.5 mm  55 mm long malleolus screw was inserted.  C-arm fluoroscopy unit was  used, and showed good position and placement.  Ankle mortise was  maintained.  Laterally the  area was opened up further; further  debridement was carried out.  The wound looked good; nice and clean.  A  six-hole plate was used.  I used first a locking device (so-called  whirlybird) to hold the plate in place, and then used a 2.5 drill bit  and then put in at the inferior aspect 1 cortical screw and  1 cancellus  screw.  The area where the fracture is, where no screw was placed, and  the proximal 2 screws -- 2 were cortical and 1 was a locking screw.  Appropriate lengths were marked on the chart by the scrub nurse.  The  wounds were reinspected; looked good.  Reapproximated using 2-0 chromic,  2-0 plain and skin staples.  Sterile dressings applied.  Tourniquet  deflated after 42 minutes.  A posterior splint applied.  Ace bandage  applied loosely.  The patient tolerated the procedure well.  He will be  admitted for IV antibiotics and observation.  He will be  placed on  enoxaparin.           ______________________________  Shela Commons. Darreld Mclean, M.D.     JWK/MEDQ  D:   05/05/2007  T:  05/07/2007  Job:  [id number]

## 2010-10-02 NOTE — Discharge Summary (Signed)
NAME:  [name], [name] NO.:  [id number]   MEDICAL RECORD NO.:  [id number]                   PATIENT TYPE:  INP   LOCATION:  2023                                 FACILITY:  [facility]   PHYSICIAN:  Carole Binning, M.D. [facility]         DATE OF BIRTH:  12-31-1940   DATE OF ADMISSION:  11/03/2003  DATE OF DISCHARGE:  11/05/2003                                 DISCHARGE SUMMARY   DISCHARGE DIAGNOSES:  1. Chest pain.  2. Coronary artery disease.  3. Non-insulin dependent diabetes mellitus.  4. Dyslipidemia.  5. Peripheral vascular disease.   HISTORY OF PRESENT ILLNESS:  This is a 70 year old gentleman with a known  history of coronary artery disease. History dates back 12 years ago when he  underwent catheterization and questionable angioplasty.  Last  catheterization was performed in May of 2003, revealed normal LV systolic  function with three vessel CAD.  He also had moderate bilateral iliac  atherosclerotic disease and occluded left internal iliac artery.  He  subsequently underwent a four vessel coronary artery bypass graft on May 9  by Salvatore Decent. Cornelius Moras, M.D. with left internal mammary artery to the LAD,  saphenous vein graft to the RCA, saphenous vein graft to the circumflex, and  saphenous vein graft to the diagonal branch.  The patient had done well  following this surgery up until early the morning of admission.  He awoke at  2:30 a.m. with right-sided chest pain, sharp cutting pain with a 9/10  severity. There was no radiation of pain, no associated nausea, shortness of  breath, diaphoresis, but there was some mild lightheadedness.  He took an  aspirin, tried walking around, and lying down without relief.  The pain  persisted and worsened until approximately 5 a.m. when he woke his wife and  drove to the emergency room at [facility].  En route to  the emergency room, his chest pain did ease off.  In the ER, he was given  three  sublingual nitroglycerin with further decrease of his chest discomfort  to 1/10.  The patient was transferred to [facility] for a cardiac  catheterization.  The patient underwent a cardiac catheterization on June  20, which showed left main coronary 20% discrete lesion, LAD descending  artery has competitive flow from the LIMA branch, first diagonal branch and  competitive flow from the diagonal graft.  Circumflex coronary artery 100%  occluded, right coronary artery 90% multiple discrete lesions in the  proximal and mid vessel.  There is competitive flow from the saphenous vein  graft, saphenous vein to the first diagonal was normal, saphenous to the  obtuse marginal was normal, saphenous to the RCA was normal.  Left internal  mammary artery was widely patent at the mid LAD, the distal LAD had a 50%  tandem lesion as it wrapped around the apex.  EF was approximately 60%.  The  patient had no postoperative complications and was discharged the following  day in stable condition.  His lab values on the day of discharge; sodium  141, potassium 4.8, chloride 107, CO2 27, glucose 150, BUN 12, creatinine  1.2, calcium 9.1.  WBC 7.2, H&H 14 and 41.4 with a platelet count of 204.  The patient's hemoglobin A1C was 7.5, cardiac enzymes were negative.  Cholesterol was 138, triglycerides 201, HDL 36, LDL 62.  The patient was  discharged to home.   DISCHARGE MEDICATIONS:  1. Aspirin 81 mg daily.  2. Glucophage 1 gram b.i.d. The patient was instructed not to take this     until Thursday, November 07, 2003.  3. Lopressor 50 daily.  4. Tylenol one to two tablets every four to six hours as needed.   DISCHARGE INSTRUCTIONS:  No heavy lifting or strenuous activity for four  days, no driving for two days.  Low fat, low salt, and low cholesterol diet.  He is to call if he developed a lump or any drainage in his groin or  temperature above 101.  He was to call to schedule an appointment to see Dr.   Ouida Sills within one to two weeks and follow with a physician assistant at  [facility] on July 7, at 3 p.m.      Chinita Pester, C.R.N.P. [facility]                 Carole Binning, M.D. [facility]    DS/MEDQ  D:  11/05/2003  T:  11/07/2003  Job:  [id number]   cc:   Croswell Bing, M.D.   Kingsley Callander. Ouida Sills, M.D.  [address]  [place]  [place] [address]  Fax: [phone]

## 2010-10-02 NOTE — Cardiovascular Report (Signed)
NAME:  [name], [name] NO.:  [id number]   MEDICAL RECORD NO.:  [id number]                   PATIENT TYPE:  INP   LOCATION:  2023                                 FACILITY:  [facility]   PHYSICIAN:  Charlton Haws, M.D.                  DATE OF BIRTH:  [birth year]   DATE OF PROCEDURE:  11/04/2003  DATE OF DISCHARGE:                              CARDIAC CATHETERIZATION   INDICATION:  Status post coronary artery bypass graft with recurrent chest  pain.   PROCEDURE:  Standard catheterization was done from the right femoral artery  using 6 French catheters.   The left main coronary artery had a 20% discrete lesion.   The left anterior descending artery had competitive flow from the LIMA  graft.  First diagonal branch had competitive flow from the diagonal graft.   The circumflex coronary artery was 100% occluded proximally.   The right coronary artery had 90% multiple discrete lesions in the proximal  and mid vessel.  There is competitive flow from the saphenous vein graft.   The saphenous vein graft to the first diagonal branch was normal.   The saphenous vein graft to the obtuse marginal branch was normal.   The saphenous vein graft to the RCA was normal.   Left internal mammary artery was widely patent at the mid LAD.  The distal  LAD had 50% tandem lesions as it wrapped the apex.   RAO VENTRICULOGRAPHY:  RAO ventriculography was normal.  EF was 60%.  There  was no gradient across the aortic valve and no MR.   PRESSURES:  Aortic pressure was 147/75, LV pressure was 140/70 range.   IMPRESSION:  The patient's pain would appear to be noncardiac in etiology.  I do not think the tandem LAD lesions near the apex would be significant.  Continued medical therapy is warranted.  He will probably be discharged in  the morning as long as his groin heals well.                                               Charlton Haws, M.D.    PN/MEDQ  D:  11/04/2003  T:   11/04/2003  Job:  [id number]   cc:   Kingsley Callander. Ouida Sills, M.D.  [address]  [place]  [place] [address]  Fax: [phone]   Jesse Sans. Wall, M.D.

## 2010-10-02 NOTE — Cardiovascular Report (Signed)
NAMEBENNY, Owen NO.:  [id number]   MEDICAL RECORD NO.:  [id number]          PATIENT TYPE:  INP   LOCATION:  6525                         FACILITY:  [facility]   PHYSICIAN:  Arturo Morton. Riley Kill, MD, FACCDATE OF BIRTH:  Oct 04, 1940   DATE OF PROCEDURE:  06/08/2006  DATE OF DISCHARGE:                            CARDIAC CATHETERIZATION   INDICATIONS:  Mr. Gilbo is a very delightful 70 year old who has  previously undergone revascularization surgery.  His grafts were open in  2005 when he was re-studied.  At that time he had fairly severe pain.  He has had recurrent pain now leading to urgent catheterization.  He  does not have definite electrocardiographic abnormalities.  BUN and  creatinine were stable.  He was brought to the catheterization  laboratory.  He was given enoxaparin in the emergency room at [facility].   PROCEDURE:  1. Left heart catheterization.  2. Selective coronary arteriography.  3. Saphenous vein graft angiography.  4. Selective left internal mammary angiography.  5. Angio-Seal closure of the right femoral artery.   DESCRIPTION OF PROCEDURE:  The patient was brought to the  catheterization laboratory and prepped and draped in the usual fashion.  Through an anterior puncture the right femoral artery was easily  entered.  A 5-French sheath was then placed.  Following this, we did  views of the left and right coronary arteries, all vein grafts in the  internal mammary artery, central aortic and left ventricular pressures  were measured with a pigtail, we did not perform ventriculography  because of the patient's diabetic status.  We then compared the study to  the old films.  During this period the right groin was re-prepped and  gloves were exchanged.  An Angio-Seal closure device was then implanted  for Angio-Seal closure of the right femoral artery.  At the beginning of  the procedure, a femoral angiogram had been performed to  document  adequate status for closure.  There were no complications.  He had  excellent hemostasis.  He was taken to the holding area in satisfactory  clinical condition.   I then reviewed the pictures with the patient's family in detail.   HEMODYNAMIC DATA:  1. Central aortic pressure 139/75, mean 101.  2. Left ventricular pressure 108/22.  3. No gradient on pullback across aortic valve.   ANGIOGRAPHIC DATA:  1. The left main has perhaps 20% narrowing.  2. The left anterior descending artery has a 70-80% stenosis with      competitive filling distally.  The first diagonal was totally      occluded.  There is a small first diagonal.  3. The saphenous vein graft to the diagonal appears to be relatively      smooth and intact.  4. The internal mammary of the distal left anterior descending is      clearly intact.  There is excellent flow to the distal vessel.      Distally there is about a 40% area of narrowing followed by about a      70% area of eccentric focal  narrowing prior to the distal LAD      bifurcation.  5. The circumflex has subtotal occlusion proximally with evidence of      competitive filling.  6. The saphenous vein graft to the OM-2 is intact.  It fills      retrograde.  There is disease noted in the retrograde filling.      There is flow down the distal vessel.  7. The right coronary artery demonstrates multiple 90% stenoses and is      totally occluded.  This has progressed from the previous study.  8. The saphenous vein graft to the distal right coronary artery is      intact and fills the PDA and posterolateral branch without      significant focal abnormality.   CONCLUSIONS:  1. Continued patency of the saphenous vein grafts to the diagonal, OM-      2, and distal right coronary artery.  2. Patency of the internal mammary to the left anterior descending      artery with diffuse distal disease distal to the graft insertion.  3. Mild elevation in left  ventricular end-diastolic pressure.   DISPOSITION:  We will search for other sources of the patient's chest  pain.  At the present time, the LAD does not appear to be tight enough  to be causing rest abnormalities.  We will get serial enzymes however to  make sure that this is okay.  Hopefully the patient will remain stable.      Arturo Morton. Riley Kill, MD, [facility]  Electronically Signed     TDS/MEDQ  D:  06/08/2006  T:  06/08/2006  Job:  [id number]   cc:   Luis Abed, MD, [facility]  Kingsley Callander. Ouida Sills, MD  CV Laboratory  Gerrit Friends. Dietrich Pates, MD, [facility]

## 2010-10-02 NOTE — Discharge Summary (Signed)
[name], [name] NO.:  [id number]   MEDICAL RECORD NO.:  [id number]          PATIENT TYPE:  INP   LOCATION:  6525                         FACILITY:  [facility]   PHYSICIAN:  Anthony Friends. Dietrich Pates, MD, FACCDATE OF BIRTH:  1940/08/09   DATE OF ADMISSION:  06/08/2006  DATE OF DISCHARGE:  06/09/2006                               DISCHARGE SUMMARY   PRIMARY CARDIOLOGIST:  Dr. Yalobusha Owen.   PRIMARY CARE PHYSICIAN:  [name] in [place], [place].   PRIMARY DIAGNOSIS:  Chest pain/coronary artery disease.   SECONDARY DIAGNOSES:  1. Hypertension.  2. Type 2 diabetes mellitus.  3. Hyperlipidemia.  4. Statin.  5. Zetia intolerance.  6. Peripheral vascular disease.  7. History of cataracts.  8. Status post left colon July 2005.  9. Status post appendectomy.  10.Remote tobacco abuse.   ALLERGIES:  [name] WAS ZETIA CAUSE SIGNIFICANT DIARRHEA.  HE HAS NOT  TRIED OTHER STATINS.   PROCEDURE:  Left heart cardiac catheterization.   HISTORY OF PRESENT ILLNESS:  A 70 year old Caucasian male with prior  history of coronary artery disease status post CABG x4 in 2003 performed  by Dr. Cornelius Owen who was in his usual state of health until approximately  5:00 p.m. on June 07, 2006, when while resting he developed anterior  chest discomfort described as sharp and stabbing, as well as with some  tightness and intermittent shortness of breath.  There were no other  associated symptoms.  Discomfort persisted waxed and waned throughout  the night, prompting him to present to the [facility] emergency room for  evaluation.  There, EKG was without acute changes, and discomfort was  relieved with one sublingual nitroglycerin.  He was subsequently  transferred to [facility] for further evaluation and cardiac  catheterization.   HOSPITAL COURSE:  [name] ruled out for MI by cardiac markers x3.  Decision was made to pursue left heart cardiac catheterization which  took  place on January 23, revealing patency of all four of his grafts  (LIMA to the LAD, vein graft to the first diagonal, vein graft to the  obtuse marginal, and vein graft to distal RCA).  There was no evidence  for a cardiac cause of his chest pain, and a D-dimer was performed and  was normal.  He has not had any recurrent chest pain this morning, will  be discharged home today in satisfactory condition.   DISCHARGE LABS:  Hemoglobin 12.5, hematocrit 36.5, WBC 7.9, platelets  172, MCV 82.2, sodium 140, potassium 4.6, chloride 104, CO2 28, BUN 13,  creatinine 0.2, glucose 111, PT 13.6, INR 1, PTT 42, total bilirubin  0.8, alkaline phosphatase 77, AST 14, ALT 12, albumin 3.3, cardiac  enzymes negative x3, total cholesterol 137, triglycerides 190, HDL 35,  LDL 64, calcium 9.1, D-dimer 0.38, TSH 1.491.   DISPOSITION:  The patient is being discharged home today in good  condition.   FOLLOWUP PLANS AND APPOINTMENTS:  He is asked to follow up with his  primary care physician, Dr. Carylon Owen, in about 1 to 2 weeks.  He has  a  follow-up with Dr. Pondera Owen on February 12 at 1:15 p.m.   DISCHARGE MEDICATIONS:  1. Lopressor 50 mg b.i.d.  2. Aspirin 81 mg daily.  3. Prinivil 10 mg daily.  4. Metformin 1000 mg b.i.d. to be resumed June 10, 2006.  5. Nitroglycerin 0.4 mg sublingual p.r.n. chest pain.   OUTSTANDING LAB STUDIES:  None.   Duration of discharge encounter 40 minutes, including physician time.      Nicolasa Ducking, ANP      Anthony Friends. Dietrich Pates, MD, [facility]  Electronically Signed    CB/MEDQ  D:  06/09/2006  T:  06/09/2006  Job:  [id number]   cc:   Kingsley Callander. Ouida Sills, MD

## 2010-10-02 NOTE — Discharge Summary (Signed)
[place]  Patient:    [name], [name] Visit Number: [id number] MRN: [id number]          Service Type: MED Location: 2000 2016 01 Attending Physician:  Tressie Stalker Dictated by:   Lissa Merlin, P.A.-C Admit Date:  09/20/2001 Discharge Date: 09/27/2001   CC:         Daisey Must, M.D. [facility]  Gerrit Friends. Dietrich Pates, M.D. [facility]  Carylon Perches, M.D.  CVTS Office   Discharge Summary  DATE OF BIRTH: 1940/09/05  ADMISSION DIAGNOSES:  1. Class IV unstable angina.  2. Acute coronary syndrome.  3. Syncopal episode.  FINAL DIAGNOSES:  1. Class IV unstable angina.  2. Acute coronary syndrome.  3. Syncopal episode.  4. Severe three-vessel coronary artery disease with normal left ventricular     function per cardiac catheterization.  5. Ruled out for myocardial infarction.  6. Type 2 diabetes mellitus.  7. Remote smoking.  8. 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  9. Appendectomy. 10. Knee surgery. 11. Cataracts.  PROCEDURES:  1. Cardiac catheterization on Sep 20, 2001.  2. Pre-CABG Dopplers on Sep 21, 2001, showing no ICA stenosis, ABIs     greater than 1.0 bilaterally.  3. CABG x 4 on Sep 22, 2001, with the following grafts: LIMA to LAD,     saphenous vein graft to RCA, saphenous vein graft to circumflex,     saphenous vein graft to diagonal.  BRIEF HISTORY:  The patient is a 70 year old white male with a history of coronary artery disease, type 2 diabetes, and remote tobacco use.  He had an MI approximately 12 years ago and was apparently treated with percutaneous coronary intervention and has done well since.  He was in his usual state of health until the day before admission when he began to develop mild chest pain at rest.  This resolved.  On the day of admission after breakfast, he developed severe chest pain on the way to work and subsequently had a syncopal episode.  He was taken to the emergency room at  [facility] and was transferred directly to [facility].  HOSPITAL COURSE:  His cardiac enzymes were negative.  He was taken directly to the cardiac catheterization lab by Dr. Gerri Spore.  Cardiac catheterization showed severe three-vessel coronary artery disease with normal left ventricular function.  CVTS was consulted.  Dr. Cornelius Moras evaluated Mr. Beever and agreed CABG was best treatment.  Risks, benefits, details, and alternatives were discussed, and it was agreed to proceed.  After routine preoperative studies, Mr. Balz was taken to directly for the procedure.  There were no complications, and he was taken to [facility] in stable condition.  Postop, Mr. Holub did well with routine care.  He did require sliding scale insulin for diabetes which was not surprising.  On postop day #2, he was taken to unit 2000 where he began walking with cardiac rehabilitation.  He had some brief episodes of hypotension but nothing major and is doing quite well on 2000.  If he continues to do well, he will be discharged home on Sep 27, 2001. At this time, he is in sinus rhythm.  Vital signs are stable.  He is afebrile. Wounds are healing well.  Physical exam is satisfactory.  Labs are satisfactory.  DISCHARGE MEDICATIONS: 1. Enteric-coated aspirin 325 mg 1 p.o. q.d. 2. Lopressor 25 mg p.o. b.i.d. 3. Avandia 2 mg p.o. q.d. 4. Glucophage 1000 mg p.o. b.i.d. 5.  Darvocet-N 100 one p.o. q.4h. p.r.n.  ALLERGIES:  No known drug allergies.  CONDITION UPON DISCHARGE:  Stable.  DISPOSITION:  Home.  SPECIAL INSTRUCTIONS:  Mr. Mcfarren is told to do no driving, heavy lifting, or strenuous activity.  He is told to walk daily and use his incentive spirometer daily.  He is to maintain a diabetic diet.  He is to clean his wounds gently daily with soap and water and call the office if he has any problems with his wounds.  He is to get a chest x-ray when he sees Dr. Dietrich Pates and bring it with him to see Dr.  Cornelius Moras.  FOLLOWUP: 1. Dr. Dietrich Pates on Oct 10, 2001, at 11 a.m. 2. Dr. Cornelius Moras on October 16, 2001, at 12:30 p.m. Dictated by:   Lissa Merlin, P.A.-C Attending Physician:  Tressie Stalker DD:  09/26/01 TD:  09/28/01 Job: [id number]

## 2010-10-02 NOTE — H&P (Signed)
NAME:  [name], GORRELL NO.:  [id number]   MEDICAL RECORD NO.:  [id number]          PATIENT TYPE:  INP   LOCATION:  1827                         FACILITY:  [facility]   PHYSICIAN:  Luis Abed, MD, FACCDATE OF BIRTH:  09-Mar-1941   DATE OF ADMISSION:  06/08/2006  DATE OF DISCHARGE:                              HISTORY & PHYSICAL   BRIEF HISTORY:  Mr. Lalani is a 70 year old, white male who was  transferred to [facility] from [facility] ER for further evaluation of  chest discomfort.   Mr. Altice states that approximately 5 or 6 o'clock last evening while  not doing anything in particular developed anterior chest discomfort  which he describes as sharp, stabbing, as well as some tightness which  was also associated with some intermittent shortness of breath.  He  denied nausea, vomiting, diaphoresis.  He stated that the discomfort  lasted throughout the night and seemed to wax and wane between a 4 and  an 8 to 9 on a scale of 0 to 10.  The last time the discomfort was a 0  was prior to onset.  He was unable to sleep and tossed and turned  secondary to the discomfort.  This morning, he continued not to feel  well.  Thus, he called work and told him he would not be in.  At  approximately 9 a.m., he called Dr. Alonza Smoker office, and he was referred  to the Emergency Room for evaluation.   At [facility] Room, an EKG did not show any acute changes, and  his discomfort was relieved with 1 sublingual nitroglycerin.  He was  transferred to [facility] for further evaluation.   PAST MEDICAL HISTORY:  Notable for no known drug allergies.  However, he  does have an intolerance to statins with diarrhea, unknown which types  have been tried in the past.   MEDICATIONS:  Prior to admission according to the patient include:   1. Lopressor 50 mg b.i.d.  2. Prinivil 10 mg daily.  3. Metformin 1000 mg b.i.d.  4. Aspirin 81 mg daily.  5. His last dose of metformin was this  morning.  He does not have      nitroglycerin at home.   MEDICAL HISTORY:  Notable for hypertension.  He does not check his blood  pressures at home.  He also has a history of type 2 diabetes.  Blood  sugars range between 70 and 100 at home.  He does not have any  associated -opathies.  He has a history of dyslipidemia unknown last  check; however, he states that he had a lot of blood work done within  the last month or 2 by Dr. Ouida Sills.  Specifics are unknown.  He has a  history of peripheral vascular disease by cardiac catheterization in  2003.  That showed a total left iliac and bilateral moderate disease in  the common iliac artery.  He has known history of coronary artery  disease with prior myocardial infarction, actual specifics unknown.  Bypass surgery in 2003 of 4 vessels was performed by  Dr. Cornelius Moras.  He  received a LIMA to the LAD, saphenous vein graft to the RCA, saphenous  vein graft to circ, saphenous vein graft to the diagonal.  Last  catheterization on November 04, 2003, showed native 3 vessel coronary artery  disease.  All grafts were patent but did note a distal 50% LAD lesion  with an EF of 60%.  He did screening colonoscopy in January 2007 which  was unremarkable.  Lap chole in July 2005.  Appendectomy and bilateral  cataracts.   SOCIAL HISTORY:  He resides in [place] with his wife of 44 years.  He  has 3 children and 6 grandchildren.  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.  He has not smoked in 15  years.  Prior to that, he smoked 1 pack a day for 33 years.  He denies  any alcohol, drug, or herbal medications.  He does try to maintain low  salt fat diet.  He states that he walks daily for exercise, and he has  not had any difficulties with this.   FAMILY HISTORY:  His mother died at the age of 102 secondary to possible  aneurysm.  She had a history of hypertension.  Father died at the age of  4 with a myocardial infarction.  He has 1  brothers and 4 sisters.  Their history consists of breast cancer, diabetes, and heart problems.   REVIEW OF SYSTEMS:  In addition to the above is notable for sinus  congestion, glasses, dentures.  All other systems are unremarkable.   PHYSICAL EXAMINATION:  GENERAL:  Well-nourished, well-developed,  pleasant, white male accompanied by his wife in no apparent distress.  VITAL SIGNS:  Temperature is 97.2, blood pressure 127/76, pulse 75,  respirations 18, 100% sats on 2 liters.  HEENT:  Unremarkable except for dentures and glasses.  NECK:  Supple without thyromegaly and adenopathy.  I do not appreciate  carotid bruits or JVD.  CHEST:  Symmetrical excursion.  Lung sounds are diminished but clear to  auscultation.  HEART:  PMI is not displaced.  Regular rate and rhythm.  I do not  appreciate murmurs, rubs, clicks, or gallops.  All pulses are  symmetrical and intact.  However, he does have bilateral femoral bruits.  The right is louder than the left.  SKIN:  Integument appears to be intact.  ABDOMEN:  Slightly rounded.  Bowel sounds present without organomegaly,  masses, or tenderness.  EXTREMITIES:  Negative cyanosis, clubbing, or edema.  MUSCULOSKELETAL:  Unremarkable.  NEURO:  Unremarkable.   Chest x-ray was performed at [facility].  It is pending at this  dictation.  EKG shows normal sinus rhythm, left axis deviation, first  degree AV block, early repolarization.  Old EKGs are not available for  comparison.  H&H is 13.4 and 39.3, normal indices, platelets 183,000,  WBCs 8.7, sodium 137, potassium 4.3, BUN 15, creatinine 1.4, glucose  179.  Point of care markers at [facility] were negative x2.   IMPRESSION:  1. Prolonged chest discomfort.  It is similar to his symptoms prior to      his bypass surgery.  2. Hypertension, improved.  3. Hyperglycemia with a history of diabetes history as listed above      per past medical history.  DISPOSITION:  The patient's history has been  reviewed by Dr. Myrtis Ser.  He  also spoke with and examined the patient and agrees with the above.  We  will admit  him to the hospital to rule out myocardial infarction.  Given  his similar  symptoms to prior to his bypass surgery, Dr. Myrtis Ser feels that a cardiac  catheterization is prudent for evaluation.  We will try to do this today  based on schedule availability.  If the catheterization cannot be  performed today, this will be scheduled for in the morning.  Further  treatment will be pending further evaluation and observation.      Joellyn Rued, PA-C      Luis Abed, MD, [facility]  Electronically Signed    EW/MEDQ  D:  06/08/2006  T:  06/08/2006  Job:  [id number]   cc:   Gerrit Friends. Dietrich Pates, MD, [facility]  Kingsley Callander. Ouida Sills, MD

## 2010-10-02 NOTE — Op Note (Signed)
NAME:  [name], VOTTA NO.:  [id number]   MEDICAL RECORD NO.:  [id number]          PATIENT TYPE:  AMB   LOCATION:  DAY                           FACILITY:  [facility]   PHYSICIAN:  R. Roetta Sessions, M.D. DATE OF BIRTH:  1940/12/23   DATE OF PROCEDURE:  06/16/2005  DATE OF DISCHARGE:                                 OPERATIVE REPORT   PROCEDURE:  Screening colonoscopy.   INDICATIONS FOR PROCEDURE:  The patient is a 70 year old gentleman sent over  at the courtesy of Dr. Carylon Perches for colorectal cancer screening. He is  devoid of any lower GI tract symptoms. There is no family history of  colorectal neoplasia. He has never had his colon imaged previously.  Colonoscopy is now being done as a screening maneuver. This approach has  been discussed with the patient at length. Potential risks, benefits, and  alternatives have been reviewed and questions answered. He is agreeable.  Please see documentation in the medical record.   PROCEDURE NOTE:  O2 saturation, blood pressure, pulse, and respirations were  monitored throughout the entire procedure. Conscious sedation with Versed 4  mg IV and Demerol 100 mg IV in divided doses.   INSTRUMENT:  Olympus video chip system.   FINDINGS:  Digital rectal exam revealed no abnormalities.   ENDOSCOPIC FINDINGS:  Prep was adequate.  Rectum:  Examination of the rectal mucosa including retroflexed view of the  anal verge revealed no abnormalities.   Colon:  Colonic mucosa was surveyed from the rectosigmoid junction through  the left, transverse, and right colon to the area of the appendiceal  orifice, ileocecal valve, and cecum. There was no appendiceal orifice  consistent with the patient's history of having undergone appendectomy  previously. Terminal ileum was intubated, and the distal 5 cm was evaluated.  From this level, the scope was slowly withdrawn, and all previously  mentioned mucosal surfaces were again seen. The colonic  mucosa appeared  entirely normal as did the terminal ileal mucosa. The patient tolerated the  procedure well and was reactive to endoscopy.   IMPRESSION:  1.  Normal rectum.  2.  Normal colon status post appendectomy. Normal terminal ileum.   The patient does not have a family history of colon cancer in any first  degree relatives. He does have a history of a maternal uncle with disease  diagnosed in his 19s.   RECOMMENDATIONS:  1.  Follow up with Dr. Ouida Sills as scheduled.  2.  Repeat screening colonoscopy 10 years.      Jonathon Bellows, M.D.  Electronically Signed     RMR/MEDQ  D:  06/16/2005  T:  06/16/2005  Job:  [id number]   cc:   Kingsley Callander. Ouida Sills, MD  Fax: [phone]

## 2010-10-02 NOTE — Letter (Signed)
June 28, 2006    Kingsley Callander. Ouida Sills, MD  [address]  [place], [place] [address]   RE:  DALTYN, DEGROAT  MRN:  [id number]  /  DOB:  [birth year]   Dear [name]:   Mr. Burich returns to the office following a recent admission to [facility] with chest pain.  Coronary angiography revealed patency of  all of his bypass grafts.  His lipid profile was excellent, although he  takes no lipid-lowering medication.  His chest discomfort gradually  faded over the course of a number of days.   CURRENT MEDICATIONS:  1. Include metformin 1000 mg b.i.d.  2. Aspirin 81 mg daily.  3. Metoprolol 50 mg b.i.d.  4. Glimepiride 1 mg daily.  5. Lisinopril 10 mg daily.  6. Actos 45 mg daily.   The patient believes that his hemoglobin A1c level is close to 6.   EXAM:  Pleasant, trim gentleman in no acute distress.  The weight is 189, 7 pounds more than in September of 2005.  Blood  pressure 125/70, heart rate is 65 and regular, respirations 16.  NECK:  No jugular venous distension; normal carotid upstrokes without  bruits.  LUNGS:  Clear.  CARDIAC:  Normal first and second heart sounds; fourth heart sound  present.  ABDOMEN:  Soft and nontender; no organomegaly.  EXTREMITIES:  Distal pulses intact; no bruising with a normal right  femoral artery pulsation at his catheterization site; however, a bruit  is also present in this region.   IMPRESSION:  Mr. Furey is doing generally well.  His chest discomfort  was likely musculoskeletal.  He would potentially benefit from treatment  with a Statin, but has tried at least 3 agents in this class, developing  diarrhea on each occasion.  I am not inclined to push further drug  trials, and we will plan to see this nice gentleman again in 12 months.    Sincerely,      Gerrit Friends. Dietrich Pates, MD, [facility]  Electronically Signed    RMR/MedQ  DD: 06/28/2006  DT: 06/28/2006  Job #: [id number]

## 2010-10-02 NOTE — H&P (Signed)
[place]  Patient:    [name], [name] Visit Number: [id number] MRN: [id number]          Service Type: MED Location: 2300 2399 02 Attending Physician:  Colon Branch Dictated by:   Noralyn Pick Eden Emms, M.D. [facility] Admit Date:  09/20/2001                           History and Physical  CHIEF COMPLAINT: [name] is a 70 year old patient of Dr. Carylon Perches.  He was sent down per Dr. Donne Hazel request for significant chest pain this morning.  HISTORY OF PRESENT ILLNESS: Apparently 12 years ago he had a heart catheterization by Dr. Alanda Amass which resulted in angioplasty.  He has not had close follow-up for his heart since then.  The patient works doing maintenance work and has had significant chest pain over the last week. It was particularly bad last night and this morning after he ate.  He was referred down to [facility]. [facility] for further work-up.  He did have some light headedness and presyncope with this chest pain this morning.  MEDICATIONS: As listed in the chart.  PHYSICAL EXAMINATION:  VITAL SIGNS: The patients examination is remarkable for a blood pressure of 130/80.  He is in sinus rhythm at a rate of about 68, with occasional PVCs.  LUNGS: Clear.  NECK: Carotids normal.  CARDIAC: S1 and S2 without murmur, rub, gallop, or click.  ABDOMEN: Benign.  EXTREMITIES: Intact pulses.  No edema.  LABORATORY DATA: He has had three EKGs that I was able to see and all of them appear normal, with no evidence of acute infarction.  His initial CPK and troponin were negative.  His initially blood sugar was 327.  IMPRESSION: The patient has had significant chest pain.  His chest x-ray is normal, with no mediastinal whitening.  His electrocardiogram is normal but he has had known previous coronary artery disease.  With significant pain I think the best plan of action would be to emergently take him to the  catheterization laboratory.  I discussed this with his family and they agree.  If he does not have any significant coronary disease on catheterization then we can do a follow-up chest CT.  We will give him 10 units of regular insulin prior to going to the laboratory.  He did take Glucophage today and we will hold this post catheterization.  His other laboratory work is in order.  Further recommendations will be based on the results of his catheterization. Dictated by:   Noralyn Pick Eden Emms, M.D. [facility] Attending Physician:  Colon Branch DD:  09/20/01 TD:  09/20/01 Job: [id number]

## 2010-10-02 NOTE — Op Note (Signed)
[place]. [facility]  Patient:    [name], [name] Visit Number: [id number] MRN: [id number]          Service Type: MED Location: 2000 2016 01 Attending Physician:  Tressie Stalker Dictated by:   Salvatore Decent. Cornelius Moras, M.D. Proc. Date: 09/22/01 Admit Date:  09/20/2001   CC:         Daisey Must, M.D. [facility] C. Eden Emms, M.D. [facility]  Carylon Perches, M.D.  CVTS Office   Cardiology Office, [place], [place]   Operative Report  PREOPERATIVE DIAGNOSES:  Severe 3-vessel coronary artery disease with Class IV unstable angina.  POSTOPERATIVE DIAGNOSES:  Severe 3-vessel coronary artery disease with Class IV unstable angina.  PROCEDURE:  Median sternotomy for coronary artery bypass grafting x4 (left internal mammary artery to distal left anterior descending coronary artery, saphenous vein graft to first diagonal branch, saphenous vein graft to second circumflex marginal branch, saphenous vein graft to distal right coronary artery).  SURGEON:  Salvatore Decent. Cornelius Moras, M.D.  ASSISTANT:  Dominica Severin, P.A.  ANESTHESIA:  General.  BRIEF CLINICAL NOTE:  The patient is a 70 year old white male from [place] followed by Dr. Carylon Perches, with history of coronary artery disease and type 2 diabetes mellitus. He was admitted on Sep 20, 2001, via [facility] with Class IV unstable angina. He ruled out for acute myocardial infarction. Cardiac catheterization performed by Dr. Loraine Leriche Pulsipher demonstrates severe 3-vessel coronary artery disease with normal left ventricular function. A full consultation note has been dictated previously.  OPERATIVE CONSENT:  The patient and his family have been counselled at length regarding the indications and potential benefits of coronary artery bypass grafting. Alternative treatment strategies have been discussed. They understand and accept all associated risks of surgery including but not limited to risks of death, stroke, myocardial  infarction, bleeding requiring blood transfusion, arrhythmia, infection, and recurrent coronary artery disease. All of their questions have been addressed.  OPERATIVE NOTE IN DETAIL:  The patient is brought to the operating room on the above mentioned date and central invasive hemodynamic monitoring is established by the anesthesia service under the care and direction of Dr. Adonis Huguenin. Specifically, a Swan-Ganz catheter is placed throughout the right internal jugular approach. A radial arterial line is placed. Intravenous antibiotics are administered. Following induction with general endotracheal anesthesia, a Foley catheter is placed. The patients chest, abdomen, both groins, and both lower extremities are prepped and draped in the usual sterile fashion.  A median sternotomy incision is performed, and the left internal mammary artery is dissected from the chest and was prepared for bypass grafting. The left internal mammary artery is notably good quality conduit. Simultaneously, saphenous vein is obtained from the patients right lower leg and the lower portion of his right thigh through a series of longitudinal incisions. The saphenous vein is notably good quality conduit. The patient is heparinized systemically.  The pericardium is opened. The ascending aorta is normal in appearance. The ascending aorta and the right atrium are cannulated for cardiopulmonary bypass. Adequate cannulization is verified. Cardiopulmonary bypass is begun, and the surface of the heart is inspected. Distal sites are selected for coronary bypass grafting. Portions of saphenous vein and the left internal mammary artery are trimmed to appropriate lengths. A temperature probe is placed to the left ventricular septum, and a styrofoam pad is placed to protect left phrenic nerve from thermal injury. A cardioplegic catheter is placed in the ascending aorta.  The patient is cooled to 32 degrees systemic  temperature. The aortic crossclamp is applied, and cardioplegia is delivered in an antegraded fashion through the aortic root. Iced saline flush is applied for a topical hypothermia. The initial cardioplegic arrest and myocardial cooling are felt to be excellent. Repeat doses of cardioplegia are administered intermittently throughout the crossclamped portion of the operation, both through the aortic root and down subsequently placed vein grafts to maintain septal temperature below 15 degrees centigrade. The following distal coronary anastomoses are performed:  1. The distal right coronary artery is grafted with a saphenous vein graft in an end-to-side fashion. This coronary measured 2.0 mm in diameter and is of good quality at the site of distal bypass. This distal anastomosis is placed immediately proximal to the level of the takeoff of the posterior descending coronary artery.  2. The second circumflex marginal branch is grafted with a saphenous vein graft in an end-to-side fashion. This coronary measures 2.0 mm in diameter and is of good quality at the site of distal bypass. It is intramyocardial beyond this.  3. The first diagonal branch off the left anterior descending coronary artery is grafted with a saphenous vein graft in an end-to-side fashion. This coronary measures 1.5 mm in diameter and is of fair-to-good quality.  4. The distal left anterior descending coronary artery is grafted with left internal mammary artery in an end-to-side fashion. This coronary measures 2.0 mm in diameter at the site of distal bypass and is of fair-to-good quality.  A 1.5 mm probe will pass to the apex of the heart. There is visible and palpable  plaque throughout this vessel. All 3 proximal saphenous vein anastomoses are performed directly to the ascending aorta prior to removal of the aortic crossclamp. The septal temperature was noted to rise rapidly and dramatically upon reperfusion of the  left internal mammary artery. The aortic crossclamp is removed after a total crossclamp time of 65 minutes.  The heart begins to beat and is defibrillated and normal sinus rhythm resumes spontaneously. All proximal and distal anastomoses are inspected for hemostasis and appropriate graft orientation. Chest tubes were fixed to the right ventricular outflow tract into the right atrial appendage. The patient is weaned from cardiopulmonary bypass without difficulty. The patients rhythm at separation from bypass is normal sinus rhythm. No inotropic support is required. Total cardiopulmonary bypass time of the operation is 78 minutes.  The venous and arterial cannulae are removed uneventfully. Protamine is administered to reverse the anticoagulation. The mediastinum and the left chest are irrigated with saline solution containing vancomycin. Meticulous surgical hemostasis is ascertained. The mediastinum and the left chest are drained with 3 chest tubes placed through separate stab incisions inferiorly. The median sternotomy is closed in routine fashion. The right lower extremity incisions are closed in multiple layers in routine fashion. All skin incisions are closed with subcuticular skin closures.  The patient tolerated the procedure well and was transported to the surgical intensive care unit in stable condition. There are no intraoperative complications. All sponge, instrument, and needle counts are verified correct at completion of the operation. No blood products were administered. Dictated by:   Salvatore Decent Cornelius Moras, M.D. Attending Physician:  Tressie Stalker DD:  09/22/01 TD:  09/25/01 Job: [id number]

## 2010-10-02 NOTE — Consult Note (Signed)
[place]. [facility]  Patient:    [name], [name] Visit Number: [id number] MRN: [id number]          Service Type: MED Location: [facility] 2921 [id number] Attending Physician:  Colon Branch Dictated by:   Salvatore Decent Cornelius Moras, M.D. Proc. Date: 09/20/01 Admit Date:  09/20/2001   CC:         Daisey Must, M.D. [facility]  Veneda Melter, M.D. [facility]  Carylon Perches, M.D.  CVTS Office   Consultation Report  REQUESTING PHYSICIAN:  Daisey Must, M.D.  PRIMARY CARE PHYSICIAN:  Carylon Perches, M.D.  REASON FOR CONSULTATION:  Severe three-vessel coronary artery disease with class IV unstable angina.  HISTORY OF PRESENT ILLNESS:  The patient is a 70 year old white male with history of coronary artery disease and type 2 diabetes mellitus and a remote history of tobacco use. He suffered acute myocardial infarction approximately 12 years ago and apparently was treated with percutaneous coronary intervention at that time. He has done well since then. He reports that he was in his usual state of good health until yesterday evening when began to develop mild transient episode of substernal chest pain occurring at rest. These resolved. This morning after breakfast he developed a severe episode of substernal chest pain while on his way to work. He turned around and went home and shortly after he got home he sustained a syncopal episode. He promptly returned to consciousness and was brought via EMS to the emergency room at [facility]. He was transferred directly to [facility]. Since hospitalization, he has remained pain-free with medical therapy. His initial set of cardiac enzymes were negative. He was taken directly to the cardiac cath lab by Dr. Daisey Must. Catheterization demonstrates severe three-vessel coronary artery disease with normal left ventricular function. Cardiac surgical consultation is requested.  REVIEW OF SYSTEMS:  GENERAL:  The patient reports otherwise  feeling well recently with normal energy level, good appetite, and no recent weight gain or weight loss. CARDIAC:  The patient denies any history of exertional chest pain or shortness of breath prior to that which developed yesterday afternoon. He is fairly active physically. He states that he gets short of breath only with very strenuous physical activity. He denies any history of PND, orthopnea, or lower extremity edema. He does have occasional palpitations. RESPIRATORY:  The patient reports good breathing with no problems with hemoptysis, wheezing, or productive cough. GASTROINTESTINAL:  Negative. The patient reports good appetite with normal bowel function. He denies any history of constipation, hematochezia, hematemesis, or melena. NEUROLOGICAL:  Negative. The patient denies any symptoms suggestive of transient monocular blindness, or transient numbness or weakness involving either upper or lower extremity. PERIPHERAL VASCULAR:  Negative. The patient denies symptoms of claudication. ENDOCRINE: Notable that the patient has a known history of type 2 diabetes mellitus. He states that he checks his blood sugars fairly frequently at home and that they have been well controlled recently averaging between 100 and 120. GENITOURINARY:  Negative. The patient denies any history of dysuria, urinary frequency, or urgency or hematuria. HEMATOLOGIC:  Negative. The patient denies any problems with bleeding diathesis, easy bruising, or frequent episodes of epistaxis. PSYCHIATRIC:  Negative.  PAST MEDICAL HISTORY:  Is as stated previously and noted for coronary artery disease with previous myocardial infarction approximately 12 years ago. The patient has known history of type 2 diabetes mellitus. He denies any known history of hypertension, hypercholesterolemia, previous stroke, or peripheral vascular disease. He has a remote history of tobacco  use, and he quit smoking completely at the time of his  myocardial infarction 12 years ago.  PAST SURGICAL HISTORY:  Notable for a previous appendectomy as well as right knee arthroscopy in the distant past. The patient has had bilateral cataract surgery.  FAMILY HISTORY:  Notable for a strong presence of coronary artery disease. The patients father died of myocardial infarction at age 45. He has numerous uncles and several other close family members with coronary artery disease.  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 This requires fairly strenuous physical activity. He is very active physically as well when he is not at work. He is currently a nonsmoker. He denies any history of excessive alcohol consumption. He lives with his wife. He has three children and several grandchildren and a very supportive family.  MEDICATIONS PRIOR TO ADMISSION:  Lopressor and Glucophage -- doses unknown.  ALLERGIES:  The patient denies any known drug allergies or sensitivities.  PHYSICAL EXAMINATION:  GENERAL:  A well-appearing thin white male who appears his stated age in no acute distress.  VITAL SIGNS:  He is currently in normal sinus rhythm by telemetry monitor with occasional PVC. Blood pressure 120/70, afebrile.  HEENT:  Essentially within normal limits. He wears glasses. He is edentulous and has a full set of upper and lower dentures.  NECK:  Supple. There is no cervical or supraclavicular lymphadenopathy. There is jugular venous distention. No carotid bruits are noted.  CHEST:  Auscultation of the chest reveals clear and symmetrical breath sounds bilaterally. No wheezes or rhonchi are noted.  CARDIOVASCULAR:  Regular rate and rhythm. No murmurs, rubs or gallops are appreciated.   ABDOMEN:  Flat, soft, and nontender. There are no palpable masses. Bowel sounds are present. The liver edge is not enlarged.  EXTREMITIES:  Warm and well-perfused. There is no lower extremity  edema. Distal pulses are palpable in both lower legs at the ankle particularly in the posterior tibial position. There is no venous insufficiency. Allens test for the left wrist is positive in that there is delayed capillary refill with manual compression of the radial pulse. This is confirmed using noninvasive vascular lab testing.  NEUROLOGICAL:  Grossly nonfocal and symmetrical throughout.  The remainder of his physical exam is unrevealing.  LABORATORY AND ACCESSORY DATA:  Admission blood work prior to catheterization include:  Baseline hemoglobin and hematocrit of 14.8 and 43% respectively. The patients white blood count is 7000 and platelet count 182,000. Coagulation profile was within normal limits. Baseline creatinine was 1.0. Serum glucose was elevated at 327.  Electrocardiogram reveals normal sinus rhythm with no acute ST or T wave changes. Cardiac catheterization performed by Dr. Gerri Spore is reviewed. This demonstrates severe three-vessel coronary artery disease with normal left ventricular function. Specifically, there is 75% proximal to mid stenosis of the left anterior descending coronary artery involving the takeoff of the first diagonal branch. There is 95% ostial stenosis of the first diagonal branch. There is 95% stenosis of the distal left anterior descending coronary artery. There is 90% proximal stenosis of the left circumflex coronary artery. There is 90% proximal stenosis of a large circumflex marginal branch. There is right dominant coronary circulation. There is 80% proximal stenosis and 95% stenosis of the mid right coronary artery. Left ventricular function is normal with ejection fraction estimated greater than 65%. There is normal wall motion.  IMPRESSION:  Severe three-vessel coronary artery disease with class IV unstable angina and normal left  ventricular function. The patient has type 2 diabetes mellitus and a remote history of tobacco use. I believe  that he would be best treated with surgical revascularization.  PLAN:  I have outlined at length the indications and potential benefits of coronary artery bypass grafting with the patient and two of his sons who are here in the room at this point in time. Alternative treatment strategies have been discussed. They understand and accept all associate risks of surgery including but limited to risk of death, stroke, myocardial infarction, bleeding requiring blood transfusion, arrhythmia, infection, recurrent coronary artery disease. All of their questions have been addressed. Dictated by:   Salvatore Decent Cornelius Moras, M.D. Attending Physician:  Colon Branch DD:  09/20/01 TD:  09/21/01 Job: [id number]

## 2010-10-02 NOTE — Discharge Summary (Signed)
NAME:  NAYIB, REMER NO.:  [id number]   MEDICAL RECORD NO.:  [id number]          PATIENT TYPE:  INP   LOCATION:  A301                          FACILITY:  [facility]   PHYSICIAN:  J. Darreld Mclean, M.D. DATE OF BIRTH:  Oct 01, 1940   DATE OF ADMISSION:  05/05/2007  DATE OF DISCHARGE:  12/21/2008LH                               DISCHARGE SUMMARY   DISCHARGE DIAGNOSIS:  Open bimalleolar fracture of the left ankle.   POST:  Open bimalleolar fracture of the left ankle.   PROCEDURE:  Debridement open fracture, left ankle.  Open treatment  internal reduction of the left ankle using Synthes compression plate and  screws.  Debridement with Water-Pik, saline, and sharp dissection.   DISCHARGE STATUS:  Improved.   PROGNOSIS:  Good.   DISPOSITION:  Home.   DISCHARGE MEDICATION:  1. Vicodin ES 1 every 4 p.r.n. pain.  2. Levaquin 500 mg 1 p.o. daily for 10 days.  3. Actos 45 mg daily.  4. Metformin hydrochloride 1000 mg twice daily.  5. Glimepiride 1 daily.  6. Metoprolol 50 mg twice daily.  7. Lisinopril 10 mg daily.   Patient is to be seen in my office 1 week after discharge.  She is to  use crutches.  Physical therapy instructions have been given with no  weightbearing.  Any difficulties call me through the office hospital  beeper system.   BRIEF HISTORY:  Patient had an on-the-job injury where a tow motor hit  his ankle.  He was seen in the emergency room, taken to surgery shortly  thereafter.  He had the above-mentioned procedure done.  First day was  afebrile.  Pain was controlled; PCA pump.  Was seen by Dr. Ouida Sills, the  physician was covering for medicine.  He was seen by  physical therapy.  He was up, active.  His wounds looked good.  He was  afebrile second postoperative day and was discharged home.  He was  independent on crutches.  He is to take the antibiotics as stated.  If  any difficulty at all, he is to contact me through the office hospital  beeper  system.  The labs were negative.  EKG was negative.                                            ______________________________  J. Darreld Mclean, M.D.     JWK/MEDQ  D:  05/30/2007  T:  05/30/2007  Job:  [id number]

## 2010-10-02 NOTE — H&P (Signed)
NAME:  [name], [name] NO.:  [id number]   MEDICAL RECORD NO.:  [id number]                   PATIENT TYPE:  INP   LOCATION:  2023                                 FACILITY:  [facility]   PHYSICIAN:  Carole Binning, M.D. [facility]         DATE OF BIRTH:  07/12/1940   DATE OF ADMISSION:  11/02/2003  DATE OF DISCHARGE:                                HISTORY & PHYSICAL   CHIEF COMPLAINT:  Chest pain.   HISTORY OF PRESENT ILLNESS:  Mr. Cullinane is a 70 year old male with history  of known coronary artery disease.  His cardiac history dates back to  approximately 12 years ago when he underwent cardiac catheterization and  question of angioplasty.  His last catheterization was performed in May  2003.  This revealed normal left ventricular systolic function with three-  vessel coronary artery disease.  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  He  subsequently underwent a four-vessel coronary artery bypass surgery on May 9  by Dr. Cornelius Moras with a left internal mammary artery to the LAD, saphenous vein  graft to the RCA, saphenous vein graft to the circumflex, and saphenous vein  graft to the diagonal branch.   The patient had done well following his surgery up until early this morning.  He was in his usual state of health when he awoke at 2:30 with right-sided  chest pain described as a sharp cutting pain which was 9/10 in severity.  There was no radiation of the pain and no associated nausea, shortness of  breath or diaphoresis, but there was some mild lightheadedness.  He took  aspirin and tried walking around and then layed down without relief of pain.  His pain persisted and worsened ultimately until approximately 5 in the  morning when he awoke his wife who drove him to the [facility] Emergency  Room.  In route to the emergency room, his chest pain did begin to ease off.  In the [facility] Room  he was given three sublingual nitroglycerin  and this further decreased his chest pain to 1/10.  He currently is chest  pain free.  EKG performed at [facility] did not show any ischemic changes.   The patient denies any recent chest pain prior to this morning's episode.  He denies any symptoms of exertional dyspnea, orthopnea, PND, or edema.  He  has not had any significant palpitations or syncope.  His cardiac risk  factors consist of non-insulin requiring diabetes mellitus, hyperlipidemia,  remote tobacco use, and family history of premature coronary artery disease.   PAST MEDICAL HISTORY:  1. Coronary artery disease as per HPI, status post coronary artery bypass     graft.  2. Non-insulin requiring diabetes mellitus.  3. History of hyperlipidemia with intolerance to statins which have caused     diarrhea.  4. Peripheral vascular disease as  per HPI.   Previous surgeries include coronary artery bypass surgery, appendectomy,  right knee surgery.   MEDICATIONS ON ADMISSION:  1. Aspirin 81 mg daily.  2. Glucophage 1000 mg b.i.d.  3. Lopressor 50 mg daily.   Social history, family history and review of systems are as documented in  the physician's assistant and written admission note.   PHYSICAL EXAMINATION:  GENERAL:  This is a well-appearing older male in no  acute distress.  VITAL SIGNS:  Temperature 97.3, pulse 69 and regular, respirations 20, blood  pressure 120/70. Oxygen saturation on two liters is 100%.  SKIN:  Warm and dry without generalized rash.  HEENT:  Normocephalic, atraumatic.  Sclerae anicteric.  Oral mucosa is  unremarkable.  He is edentulous.  NECK:  There is no adenopathy or thyromegaly.  No jugular venous distention.  Carotid upstroke normal without bruit.  CHEST:  Clear to percussion and auscultation.  CARDIAC:  PMI nondisplaced.  Regular rate and rhythm.  Positive S4.  Normal  S1, S2.  No rub, murmur or S3.  ABDOMEN:  Soft, nontender without organomegaly.   Normal bowel sounds.  No  bruits.  EXTREMITIES:  No clubbing, cyanosis or edema.  Peripheral pulses:  Femoral  are 2+ bilaterally with bilateral femoral bruits, louder on the right than  the left.  The dorsal pedal pulses are 1+ and posterior tibial pulses are  trace bilaterally.   EKG from this morning shows normal sinus rhythm at a rate of 57 with first-  degree AV block.  Otherwise, EKG is normal.   Other laboratory data are as noted in the chart.  Initial cardiac markers  including CK-MB, troponin I and myoglobin are all within normal limits.   ASSESSMENT:  The patient is a 70 year old male with history of known  coronary artery disease status post coronary artery bypass surgery two  years ago. He presents with symptoms of chest pain worrisome for unstable  angina.  The pain is somewhat different from his original presenting pain.  Nevertheless, he does have known coronary artery disease and multiple  cardiac risk factors.   PLAN:  The patient will be admitted to a telemetry bed. He has been started  on heparin and nitroglycerin.  We will continue aspirin and beta blocker.  We will check cardiac enzymes to rule out myocardial infarction.  We  anticipate proceeding with cardiac catheterization to assess his coronary  anatomy.                                                Carole Binning, M.D. [facility]    MWP/MEDQ  D:  11/03/2003  T:  11/04/2003  Job:  [id number]   cc:   Kingsley Callander. Ouida Sills, M.D.  [address]  [place]  [place] [address]  Fax: [phone]   [place] Bing, M.D.

## 2010-10-02 NOTE — Op Note (Signed)
[place]. [facility]  Patient:    Anthony, Owen Visit Number: [id number] MRN: [id number]          Service Type: MED Location: 2000 2016 01 Attending Physician:  Tressie Stalker Dictated by:   Salvatore Decent. Cornelius Moras, M.D. Proc. Date: 09/22/01 Admit Date:  09/20/2001 Discharge Date: 09/27/2001   CC:         Daisey Must, M.D.  Charlton Haws, M.D.  Enid Cutter, M.D.   Operative Report  PREOPERATIVE DIAGNOSIS: Severe three vessel coronary artery disease with class 4 unstable angina.  POSTOPERATIVE DIAGNOSIS: Same.  PROCEDURE: Median sternotomy for coronary artery bypass grafting times four (left internal mammary artery to distal left anterior descending coronary artery, saphenous vein graft to first diagonal branch, saphenous vein graft to second circumflex marginal branch, saphenous vein graft to distal right coronary artery).  SURGEON:  Salvatore Decent. Cornelius Moras, M.D.  ASSISTANT:  Ms. Liane Comber.  ANESTHESIA:  General.  BRIEF CLINICAL NOTE:  The patient is a 70 year old white male from [place] followed by Dr. Rosendo Gros with a history of coronary artery disease and type 2 diabetes mellitus.  He was admitted on May 7 via [facility] with class 4 unstable angina.  He ruled out for acute myocardial infarction. Cardiac catheterization performed by Dr. Loraine Leriche Pulsipher demonstrates severe three vessel coronary artery disease with normal left ventricular function.  A full consultation note has been dictated previously.  OPERATIVE CONSENT:  The patient and his family have been counseled at length regarding the indications and potential benefits of coronary artery bypass grafting.  Alternative treatment strategies have been discussed. They understand and accept all associated risks of surgery including, but not limited to, risk of death, stroke, myocardial infarction, bleeding requiring blood transfusion, arrhythmia, infection, and recurrent coronary  artery disease.  All their questions have been addressed.  OPERATIVE NOTE IN DETAIL:  The patient is brought to the Operating Room on the above mentioned date and central invasive hemodynamic monitoring is established by the Anesthesia service under the care and direction of Dr. Adonis Huguenin. Specifically, a Swan-Ganz catheter is placed through the right internal jugular approach.  A radial arterial line is placed.  Intravenous antibiotics are administered.  Following induction with general endotracheal anesthesia, a Foley catheter is placed. The patients chest, abdomen, both groins and both lower extremities are prepared and draped in a sterile manner.   A median sternotomy incision is performed and the left internal mammary artery is dissected from the chest wall and prepared for bypass grafting.  The left internal mammary artery is notably good quality conduit.  Simultaneously, saphenous vein is obtained from the patients right lower leg and lower portion of his right thigh through a series of longitudinal incisions.  The saphenous vein is notably good quality conduit.  The patient is heparinized systemically.  The pericardium is opened. The ascending aorta is normal in appearance. The ascending aorta and the right atrium are cannulated for cardiopulmonary bypass.  Adequate heparinized is verified. Cardiopulmonary bypass is begun and the surface of the heart is inspected.  Distal sites are selected for coronary artery bypass grafting. Portions of saphenous vein and the left internal mammary artery are trimmed to the appropriate length.  A temperature probe is placed in the left ventricular septum and a styrofoam pad is placed to protect the left phrenic nerve from thermal injury.  A cardioplegia catheter is placed in the ascending aorta.  The patient is cooled to 32 degrees systemic temperature.  The aorta cross-clamp is applied and cardioplegia is delivered in an antegrade  fashion through the aortic root.  Ice saline slush is applied for topical hypothermia. The initial cardioplegia graft and myocardial cooling are felt to be excellent.  Repeat doses of cardioplegia are administered intermittently throughout the cross-clamp portion of the operation, both through the aortic root and down subsequently placed vein grafts to maintain septal temperature below 15 degrees Centigrade.  The following distal coronary anastomoses are performed: 1. The distal right coronary artery is grafted with a saphenous vein graft in    an end to side fashion.  This coronary measured 2.0 mm in diameter and is    of good quality at the site of distal bypass. This distal anastomosis is    placed immediately proximal to the level of the takeoff of the posterior    descending artery. 2. The second circumflex marginal branch is grafted with a saphenous vein    graft in an end to side fashion. This coronary measures 2.0 mm in diameter    and is of good quality at the site of distal bypass.  It is intramyocardial    beyond this. 3. The first diagonal branch off the left anterior descending coronary artery    is grafted with a saphenous vein graft in an end to side fashion. This    coronary measures 1.5 mm in diameter and is of fair to good quality. 4. The distal left anterior descending coronary artery is grafted with the    left internal mammary artery in an end to side fashion.  This coronary    measures 2.0 mm in diameter at the site of distal bypass and is of fair to    good quality. A 1.5 mm probe was passed through the apex of the heart.   There is visible and palpable plaque throughout this vessel. All three    proximal saphenous vein anastomoses are performed directly to the ascending    aorta prior to removal of the aortic cross-clamp.  The septal temperature     is noted to rise rapidly and dramatically upon reperfusion of the    left internal mammary artery. The aortic  cross-clamp is removed afater    a total cross-clamp time of 65 minutes.  The heart began to beat and is defibrillated and normal sinus rhythm resumed spontaneously.  All proximal and distal anastomoses are inspected for hemostasis and appropriate graft orientation.  Epicardial pacing wires are fixed to the right ventricular outflow tract and to the right atrial appendage.  The patient is weaned from cardiopulmonary bypass without difficulty.  The patients rhythm after separated from bypass is normal sinus rhythm.  No inotropic support is required.  Total cardiopulmonary bypass time for the operation is 78 minutes.  The venous and arterial cannulae are removed uneventfully.  Protamine is administered to reverse anticoagulation. The mediastinum and the left chest are irrigated with saline solution containing vancomycin.  Meticulous surgical hemostasis is ascertained.  The mediastinum and the left chest are drained with three chest tubes placed through separate stab incisions inferiorly.  The median sternotomy is closed in routine fashion.  The right lower extremity incisions are closed in multiple layers in routine fashion.  All skin incisions are closed with subcuticular skin closures.  The patient tolerated the procedure well and is transported to the Surgical Intensive Care Unit in stable condition.  There are no intraoperative complications. All sponge, instrument and needle counts are verified correct at the completion  of the operation.  No blood products were administered.   cc: CVTS [facility] Cardiology Office     [employer], [place]  [place] Dictated by:   Salvatore Decent. Cornelius Moras, M.D. Attending Physician:  Tressie Stalker DD:  09/22/01 TD:  09/25/01 Job: [id number]

## 2010-10-02 NOTE — Op Note (Signed)
NAME:  [name], [name] NO.:  [id number]   MEDICAL RECORD NO.:  [id number]                   PATIENT TYPE:  AMB   LOCATION:  DAY                                  FACILITY:  [facility]   PHYSICIAN:  Dalia Heading, M.D.               DATE OF BIRTH:  January 30, 1941   DATE OF PROCEDURE:  12/02/2003  DATE OF DISCHARGE:                                 OPERATIVE REPORT   PREOPERATIVE DIAGNOSIS:  Cholecystitis, cholelithiasis.   POSTOPERATIVE DIAGNOSIS:  Cholecystitis, cholelithiasis.   PROCEDURE:  Laparoscopic cholecystectomy.   SURGEON:  Dalia Heading, M.D.   ASSISTANT:  Bernerd Limbo. Leona Carry, M.D.   ANESTHESIA:  General endotracheal.   INDICATIONS FOR PROCEDURE:  The patient is a 70 year old white male who  presents with cholecystitis secondary to cholelithiasis.  The risks and  benefits of the procedure, including bleeding, infection, hepatobiliary  injury, and the possibility of an open procedure were fully explained to the  patient who gave informed consent.   DESCRIPTION OF PROCEDURE:  The patient was placed in the supine position.  After induction of general endotracheal anesthesia, the abdomen was prepped  and draped using the usual sterile technique with Betadine.  Surgical site  confirmation was performed.   A supraumbilical incision was made down to the fascia.  A Veress needle was  introduced into the abdominal cavity, and confirmation of placement was done  using the saline drop test.  The abdomen was then insufflated to 16 mmHg  pressure.  An 11-mm trocar was introduced into the abdominal cavity under  direct visualization without difficulty.  The patient was placed in reverse  Trendelenburg position, and an additional 11-mm trocar was placed in the  epigastric region and 5-mm trocars were placed in the right upper quadrant  and right flank regions.  The liver was inspected and noted to be within  normal limits.  The gallbladder was retracted  superiorly and laterally.  The  dissection was begun around the infundibulum of the gallbladder.  The cystic  duct was first identified.  Its juncture to the infundibulum was fully  identified.  Endoclips were placed proximally and distally on the cystic  duct, and the cystic duct was divided.  This was likewise done on the cystic  artery.  The gallbladder was then freed away from the gallbladder fossa  using Bovie electrocautery.  The gallbladder was delivered through the  epigastric trocar site using an EndoCatch bag.  The gallbladder fossa was  inspected, and no abnormal bleeding or bile leakage was noted.  Surgicel was  placed in the gallbladder fossa.  All fluid and air were then evacuated from  the abdominal cavity prior to removal of the trocars.   All wounds were irrigated with normal saline.  All wounds were injected with  0.5% Sensorcaine.  The supraumbilical fascia was reapproximated using an 0  Vicryl interrupted suture.  All  skin incisions were closed using staples.  Betadine ointment and dry sterile dressings were applied.   All tape and needle counts were correct at the end of the procedure.  The  patient was extubated in the operating room and went back to the recovery  room awake and in stable condition.   COMPLICATIONS:  None.   SPECIMENS:  Gallbladder with stones.   ESTIMATED BLOOD LOSS:  Minimal.      ___________________________________________                                            Dalia Heading, M.D.   MAJ/MEDQ  D:  12/02/2003  T:  12/02/2003  Job:  [id number]   cc:   Kingsley Callander. Ouida Sills, M.D.  [address]  [place]  [place] [address]  Fax: [phone]

## 2010-10-02 NOTE — Op Note (Signed)
[place]  Patient:    [name], WERTH Visit Number: [id number] MRN: [id number]          Service Type: MED Location: [facility] [id number] Attending Physician:  Colon Branch Dictated by:   Daisey Must, M.D. [facility] Proc. Date: 09/20/01 Admit Date:  09/20/2001   CC:         Carylon Perches, M.D.  Noralyn Pick. Eden Emms, M.D. [facility]  Cardiac Catheterization Laboratory   Operative Report  PROCEDURES PERFORMED: Left heart catheterization with coronary angiography, left ventriculography, and abdominal aortography.  INDICATIONS: The patient is a 70 year old male with a history of coronary artery disease, status post PTCA in 1992 following an acute myocardial infarction. He also has long-standing diabetes. He presented to the emergency room this morning with prolonged substernal chest pain. There were no diagnostic ECG changes. However, because of ongoing chest pain, he was referred emergently for cardiac catheterization.  DESCRIPTION OF PROCEDURE: A 7French sheath was placed in the right femoral artery. Standard Judkins 6 French catheters were utilized. Contrast was Omnipaque. At the conclusion of the procedure, a Perclose vascular closure device was placed in the right femoral artery with good hemostasis. There were no complications.  RESULTS:  HEMODYNAMICS: Left ventricular pressure 126/15, aortic pressure 126/70.  There was no aortic valve gradient.  LEFT VENTRICULOGRAM: Wall motion is normal. Ejection fraction estimated at greater than 65%. There is no mitral regurgitation.  ABDOMINAL AORTOGRAM: Renal arteries are patent.  There is mild atherosclerotic disease of the distal abdominal aorta and moderate atherosclerotic disease of both common iliac arteries. 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  CORONARY ARTERIOGRAPHY: (Right dominant).  Left main normal.  The left anterior descending artery has a 75% stenosis in the proximal  vessel across the origin of the first diagonal branch. There is a 95% stenosis in the mid LAD followed by 40% in the distal LAD. The apical LAD has an 80% stenosis. The LAD gives rise to a large first diagonal branch which has a 95% stenosis at its origin and a 75% stenosis in the mid body.  The left circumflex has a tubular 90% stenosis in the mid vessel followed by a 40% stenosis just proximal to OM-2. The circumflex gives rise to a normal sized branching ramus intermediate, small OM-1, normal sized OM-2, small OM-3 and a small OM-4. OM-2 has a 40% stenosis proximally.  Right coronary artery is a dominant vessel. There is a 40% followed by an 80% stenosis in the proximal vessel. The mid vessel has a diffuse 95% stenosis and the distal vessel has a 50% stenosis. The distal right coronary artery gives rise to a normal sized posterior descending artery, a small first posterolateral branch, normal sized second posterolateral branch and a small third posterolateral branch.  IMPRESSIONS: 1. Normal left ventricular systolic function. 2. Severe diffuse three-vessel coronary artery disease.  PLAN: At this point, the patient is pain-free and hemodynamically stable. He will be maintained on heparin and nitroglycerin. He will be referred for coronary artery bypass surgery. Dictated by:   Daisey Must, M.D. [facility] Attending Physician:  Colon Branch DD:  09/20/01 TD:  09/21/01 Job: [id number]

## 2010-10-02 NOTE — H&P (Signed)
NAME:  [name], [name] NO.:  [id number]   MEDICAL RECORD NO.:  [id number]                  PATIENT TYPE:   LOCATION:                                       FACILITY:   PHYSICIAN:  Dalia Heading, M.D.               DATE OF BIRTH:  Dec 10, 1940   DATE OF ADMISSION:  12/02/2003  DATE OF DISCHARGE:                                HISTORY & PHYSICAL   CHIEF COMPLAINT:  Biliary colic, cholelithiasis.   HISTORY OF PRESENT ILLNESS:  The patient is a 70 year old white male who was  referred for evaluation and treatment of biliary colic secondary to  cholelithiasis.  He has been having intermittent episodes of right upper  quadrant abdominal pain with radiation to the right flank, chest pain, and  bloating for several weeks.  Cardiac catheterization was negative.  Some  fatty food intolerance is noted.  No fever, chills, or jaundice have been  noted.  The symptoms have persisted.   PAST MEDICAL HISTORY:  Includes:  1. Coronary artery disease.  2. Hypertension.  3. Diabetes.   PAST SURGICAL HISTORY:  1. Open heart surgery two years ago.  2. Recent cardiac catheterization which was negative.   CURRENT MEDICATIONS:  Diabetic medications, Lopressor.   ALLERGIES:  NO KNOWN DRUG ALLERGIES.   REVIEW OF SYSTEMS:  Noncontributory.   PHYSICAL EXAMINATION:  The patient is a well-developed, well-nourished white  male in no acute distress.  He is afebrile and vital signs are stable.  HEENT EXAMINATION:  Reveals no scleral icterus.  LUNGS:  Clear to auscultation with equal breath sounds bilaterally.  HEART EXAMINATION:  Reveals a regular rate and rhythm without S3, S4 or  murmurs.  ABDOMEN:  Soft with slight tenderness noted in the right upper quadrant to  palpation.  No hepatosplenomegaly, masses, or hernias are identified.  Ultrasound of the gallbladder reveals cholelithiasis with a normal common  bile duct.   IMPRESSION:  1. Cholecystitis.  2.  Cholelithiasis.   PLAN:  The patient is scheduled for laparoscopic cholecystectomy on December 02, 2003.  The risks and benefits of the procedure including bleeding,  infection, hepatobiliary injury, and the possibility of an open procedure  were fully explained to the patient, who gave informed consent.     ___________________________________________                                         Dalia Heading, M.D.   MAJ/MEDQ  D:  11/28/2003  T:  11/28/2003  Job:  [id number]   cc:   Kingsley Callander. Ouida Sills, M.D.  [address]  [place]  [place] [address]  Fax: [phone]

## 2011-02-19 LAB — HEPATIC FUNCTION PANEL
Albumin: 3.7
Total Protein: 6.7

## 2011-02-19 LAB — URINALYSIS, ROUTINE W REFLEX MICROSCOPIC
Glucose, UA: 100 — AB
pH: 5.5

## 2011-02-19 LAB — BASIC METABOLIC PANEL
BUN: 22
Calcium: 8.9
Chloride: 102
Chloride: 106
Creatinine, Ser: 1.65 — ABNORMAL HIGH
GFR calc Af Amer: 54 — ABNORMAL LOW
Potassium: 4.4
Sodium: 134 — ABNORMAL LOW

## 2011-02-19 LAB — DIFFERENTIAL
Basophils Relative: 0
Eosinophils Absolute: 0.1 — ABNORMAL LOW
Lymphs Abs: 1.5
Neutro Abs: 4.6
Neutrophils Relative %: 68

## 2011-02-19 LAB — CBC
MCV: 82.3
Platelets: 181
WBC: 6.7

## 2011-08-26 ENCOUNTER — Emergency Department (HOSPITAL_COMMUNITY): Payer: Medicare Other

## 2011-08-26 ENCOUNTER — Emergency Department (HOSPITAL_COMMUNITY): Payer: Medicare Other | Admitting: Anesthesiology

## 2011-08-26 ENCOUNTER — Encounter (HOSPITAL_COMMUNITY): Payer: Self-pay | Admitting: Anesthesiology

## 2011-08-26 ENCOUNTER — Encounter (HOSPITAL_COMMUNITY): Payer: Self-pay | Admitting: *Deleted

## 2011-08-26 ENCOUNTER — Encounter (HOSPITAL_COMMUNITY)
Admission: EM | Disposition: A | Discharge status: Home Health | Payer: Self-pay | Source: Home / Self Care | Attending: Orthopaedic Surgery

## 2011-08-26 ENCOUNTER — Inpatient Hospital Stay (HOSPITAL_COMMUNITY)
Admission: EM | Admit: 2011-08-26 | Discharge: 2011-08-28 | DRG: 494 | Disposition: A | Discharge status: Home Health | Payer: Medicare Other | Attending: Orthopaedic Surgery | Admitting: Orthopaedic Surgery

## 2011-08-26 ENCOUNTER — Encounter (HOSPITAL_COMMUNITY): Payer: Self-pay | Admitting: Emergency Medicine

## 2011-08-26 DIAGNOSIS — W11XXXA Fall on and from ladder, initial encounter: Secondary | ICD-10-CM | POA: Diagnosis present

## 2011-08-26 DIAGNOSIS — E119 Type 2 diabetes mellitus without complications: Secondary | ICD-10-CM | POA: Diagnosis present

## 2011-08-26 DIAGNOSIS — S82843A Displaced bimalleolar fracture of unspecified lower leg, initial encounter for closed fracture: Principal | ICD-10-CM | POA: Diagnosis present

## 2011-08-26 DIAGNOSIS — I10 Essential (primary) hypertension: Secondary | ICD-10-CM | POA: Diagnosis present

## 2011-08-26 HISTORY — PX: ORIF ANKLE FRACTURE: SHX5408

## 2011-08-26 LAB — BASIC METABOLIC PANEL
BUN: 22 mg/dL (ref 6–23)
Chloride: 103 mEq/L (ref 96–112)
GFR calc Af Amer: 37 mL/min — ABNORMAL LOW (ref >90)
Glucose, Bld: 144 mg/dL — ABNORMAL HIGH (ref 70–99)
Potassium: 5.7 mEq/L — ABNORMAL HIGH (ref 3.5–5.1)

## 2011-08-26 LAB — GLUCOSE, CAPILLARY: Glucose-Capillary: 128 mg/dL — ABNORMAL HIGH (ref 70–99)

## 2011-08-26 LAB — CBC
Hemoglobin: 11.8 g/dL — ABNORMAL LOW (ref 13.0–17.0)
RBC: 4.17 MIL/uL — ABNORMAL LOW (ref 4.22–5.81)
WBC: 7 10*3/uL (ref 4.0–10.5)

## 2011-08-26 LAB — APTT: aPTT: 36 seconds (ref 24–37)

## 2011-08-26 LAB — DIFFERENTIAL
Basophils Relative: 0 % (ref 0–1)
Lymphs Abs: 1.2 10*3/uL (ref 0.7–4.0)
Monocytes Relative: 7 % (ref 3–12)
Neutro Abs: 5.2 10*3/uL (ref 1.7–7.7)
Neutrophils Relative %: 75 % (ref 43–77)

## 2011-08-26 LAB — TYPE AND SCREEN: ABO/RH(D): O POS

## 2011-08-26 SURGERY — OPEN REDUCTION INTERNAL FIXATION (ORIF) ANKLE FRACTURE
Anesthesia: Spinal | Site: Ankle | Laterality: Right | Wound class: Clean

## 2011-08-26 MED ORDER — MORPHINE SULFATE 25 MG/ML IV SOLN
4.0000 mg/h | Freq: Once | INTRAVENOUS | Status: DC
  Filled 2011-08-26: qty 10

## 2011-08-26 MED ORDER — ACETAMINOPHEN 10 MG/ML IV SOLN
1000.0000 mg | Freq: Four times a day (QID) | INTRAVENOUS | Status: DC
  Administered 2011-08-26 – 2011-08-27 (×3): 1000 mg via INTRAVENOUS
  Filled 2011-08-26 (×5): qty 100

## 2011-08-26 MED ORDER — DIPHENHYDRAMINE HCL 50 MG/ML IJ SOLN: 12.5000 mg | Freq: Four times a day (QID) | INTRAMUSCULAR | Status: DC | PRN

## 2011-08-26 MED ORDER — PROPOFOL 10 MG/ML IV EMUL
INTRAVENOUS | Status: DC | PRN
  Administered 2011-08-26: 25 ug/kg/min via INTRAVENOUS

## 2011-08-26 MED ORDER — METOPROLOL TARTRATE 50 MG PO TABS
100.0000 mg | ORAL_TABLET | Freq: Two times a day (BID) | ORAL | Status: DC
  Administered 2011-08-26 – 2011-08-28 (×4): 100 mg via ORAL
  Filled 2011-08-26 (×4): qty 2

## 2011-08-26 MED ORDER — SODIUM CHLORIDE 0.9 % IR SOLN
Status: DC | PRN
  Administered 2011-08-26: 1000 mL

## 2011-08-26 MED ORDER — GLIMEPIRIDE 2 MG PO TABS
2.0000 mg | ORAL_TABLET | Freq: Every day | ORAL | Status: DC
  Administered 2011-08-27: 2 mg via ORAL
  Filled 2011-08-26: qty 1

## 2011-08-26 MED ORDER — HYDROMORPHONE HCL PF 1 MG/ML IJ SOLN
0.5000 mg | Freq: Once | INTRAMUSCULAR | Status: AC
  Administered 2011-08-26: 0.5 mg via INTRAVENOUS
  Filled 2011-08-26: qty 1

## 2011-08-26 MED ORDER — CEFAZOLIN SODIUM 1-5 GM-% IV SOLN
INTRAVENOUS | Status: DC | PRN
  Administered 2011-08-26: 1 g via INTRAVENOUS

## 2011-08-26 MED ORDER — ALBUTEROL SULFATE (5 MG/ML) 0.5% IN NEBU
2.5000 mg | INHALATION_SOLUTION | Freq: Four times a day (QID) | RESPIRATORY_TRACT | Status: DC
  Administered 2011-08-27 (×2): 2.5 mg via RESPIRATORY_TRACT
  Filled 2011-08-26 (×2): qty 0.5

## 2011-08-26 MED ORDER — MAGNESIUM HYDROXIDE 400 MG/5ML PO SUSP: 30.0000 mL | Freq: Every day | ORAL | Status: DC | PRN

## 2011-08-26 MED ORDER — MORPHINE SULFATE 4 MG/ML IJ SOLN
INTRAMUSCULAR | Status: AC
  Filled 2011-08-26: qty 1

## 2011-08-26 MED ORDER — ZOLPIDEM TARTRATE 5 MG PO TABS
5.0000 mg | ORAL_TABLET | Freq: Every day | ORAL | Status: DC
  Administered 2011-08-26 – 2011-08-27 (×2): 5 mg via ORAL
  Filled 2011-08-26 (×2): qty 1

## 2011-08-26 MED ORDER — LACTATED RINGERS IV SOLN
INTRAVENOUS | Status: DC | PRN
  Administered 2011-08-26: 16:00:00 via INTRAVENOUS

## 2011-08-26 MED ORDER — NALOXONE HCL 0.4 MG/ML IJ SOLN: 0.4000 mg | INTRAMUSCULAR | Status: DC | PRN

## 2011-08-26 MED ORDER — SODIUM CHLORIDE 0.9 % IV SOLN
INTRAVENOUS | Status: DC
  Administered 2011-08-26: 1000 mL via INTRAVENOUS
  Administered 2011-08-26 – 2011-08-28 (×4): via INTRAVENOUS

## 2011-08-26 MED ORDER — KETOROLAC TROMETHAMINE 30 MG/ML IJ SOLN
15.0000 mg | Freq: Once | INTRAMUSCULAR | Status: AC
  Administered 2011-08-26: 15 mg via INTRAVENOUS
  Filled 2011-08-26: qty 1

## 2011-08-26 MED ORDER — METFORMIN HCL 500 MG PO TABS
1000.0000 mg | ORAL_TABLET | Freq: Two times a day (BID) | ORAL | Status: DC
  Administered 2011-08-27 (×2): 1000 mg via ORAL
  Filled 2011-08-26 (×2): qty 2

## 2011-08-26 MED ORDER — GEMFIBROZIL 600 MG PO TABS
600.0000 mg | ORAL_TABLET | Freq: Two times a day (BID) | ORAL | Status: DC
  Administered 2011-08-26 – 2011-08-28 (×4): 600 mg via ORAL
  Filled 2011-08-26 (×4): qty 1

## 2011-08-26 MED ORDER — CEFAZOLIN SODIUM 1-5 GM-% IV SOLN: 1.0000 g | Freq: Once | INTRAVENOUS | Status: DC

## 2011-08-26 MED ORDER — MIDAZOLAM HCL 5 MG/5ML IJ SOLN
INTRAMUSCULAR | Status: DC | PRN
  Administered 2011-08-26: 2 mg via INTRAVENOUS

## 2011-08-26 MED ORDER — ACETAMINOPHEN 325 MG PO TABS: 650.0000 mg | ORAL_TABLET | Freq: Four times a day (QID) | ORAL | Status: DC | PRN

## 2011-08-26 MED ORDER — MORPHINE SULFATE 4 MG/ML IJ SOLN
4.0000 mg | Freq: Once | INTRAMUSCULAR | Status: AC
  Administered 2011-08-26: 4 mg via INTRAVENOUS

## 2011-08-26 MED ORDER — ALBUTEROL SULFATE (5 MG/ML) 0.5% IN NEBU
2.5000 mg | INHALATION_SOLUTION | Freq: Four times a day (QID) | RESPIRATORY_TRACT | Status: DC
  Administered 2011-08-26: 2.5 mg via RESPIRATORY_TRACT
  Filled 2011-08-26: qty 0.5

## 2011-08-26 MED ORDER — PIOGLITAZONE HCL 30 MG PO TABS
45.0000 mg | ORAL_TABLET | Freq: Every day | ORAL | Status: DC
  Administered 2011-08-27 – 2011-08-28 (×2): 45 mg via ORAL
  Filled 2011-08-26 (×2): qty 2

## 2011-08-26 MED ORDER — PROMETHAZINE HCL 25 MG/ML IJ SOLN: 12.5000 mg | INTRAMUSCULAR | Status: DC | PRN

## 2011-08-26 MED ORDER — FENTANYL CITRATE 0.05 MG/ML IJ SOLN
INTRAMUSCULAR | Status: DC | PRN
  Administered 2011-08-26: 25 ug via INTRAVENOUS

## 2011-08-26 MED ORDER — SODIUM CHLORIDE 0.9 % IJ SOLN: 9.0000 mL | INTRAMUSCULAR | Status: DC | PRN

## 2011-08-26 MED ORDER — EPHEDRINE SULFATE 50 MG/ML IJ SOLN
INTRAMUSCULAR | Status: DC | PRN
  Administered 2011-08-26: 5 mg via INTRAVENOUS
  Administered 2011-08-26 (×2): 10 mg via INTRAVENOUS

## 2011-08-26 MED ORDER — ONDANSETRON HCL 4 MG/2ML IJ SOLN
4.0000 mg | Freq: Four times a day (QID) | INTRAMUSCULAR | Status: DC | PRN
  Administered 2011-08-26: 4 mg via INTRAVENOUS

## 2011-08-26 MED ORDER — ACETAMINOPHEN 10 MG/ML IV SOLN
INTRAVENOUS | Status: AC
  Filled 2011-08-26: qty 200

## 2011-08-26 MED ORDER — MORPHINE SULFATE (PF) 1 MG/ML IV SOLN
INTRAVENOUS | Status: DC
  Administered 2011-08-26: 19:00:00 via INTRAVENOUS
  Administered 2011-08-27: 9 mg via INTRAVENOUS
  Administered 2011-08-27 – 2011-08-28 (×2): 3 mg via INTRAVENOUS
  Administered 2011-08-28: 08:00:00 via INTRAVENOUS
  Administered 2011-08-28: 1.5 mg via INTRAVENOUS
  Administered 2011-08-28: 3 mg via INTRAVENOUS
  Filled 2011-08-26: qty 25

## 2011-08-26 MED ORDER — DIPHENHYDRAMINE HCL 12.5 MG/5ML PO ELIX: 12.5000 mg | ORAL_SOLUTION | Freq: Four times a day (QID) | ORAL | Status: DC | PRN

## 2011-08-26 MED ORDER — LISINOPRIL 10 MG PO TABS
20.0000 mg | ORAL_TABLET | Freq: Every day | ORAL | Status: DC
  Administered 2011-08-27 – 2011-08-28 (×2): 20 mg via ORAL
  Filled 2011-08-26 (×2): qty 2

## 2011-08-26 MED ORDER — ASPIRIN EC 81 MG PO TBEC
81.0000 mg | DELAYED_RELEASE_TABLET | Freq: Every day | ORAL | Status: DC
  Administered 2011-08-27 – 2011-08-28 (×2): 81 mg via ORAL
  Filled 2011-08-26 (×2): qty 1

## 2011-08-26 SURGICAL SUPPLY — 56 items
BAG HAMPER (MISCELLANEOUS) ×2 IMPLANT
BANDAGE ELASTIC 4 VELCRO NS (GAUZE/BANDAGES/DRESSINGS) ×1 IMPLANT
BANDAGE ELASTIC 6 VELCRO NS (GAUZE/BANDAGES/DRESSINGS) ×1 IMPLANT
BANDAGE ESMARK 4X12 BL STRL LF (DISPOSABLE) ×1 IMPLANT
BIT DRILL 2.5X110 QC LCP DISP (BIT) ×2 IMPLANT
BIT DRILL 2.8 (BIT) ×1
BIT DRILL CANN QC 2.8X165 (BIT) IMPLANT
BIT DRILL JC END 3.2X130 (BIT) ×1 IMPLANT
BLADE SURG 15 STRL LF DISP TIS (BLADE) ×1 IMPLANT
BLADE SURG 15 STRL SS (BLADE) ×2
BNDG CMPR 12X4 ELC STRL LF (DISPOSABLE) ×1
BNDG ESMARK 4X12 BLUE STRL LF (DISPOSABLE) ×2
CLOTH BEACON ORANGE TIMEOUT ST (SAFETY) ×2 IMPLANT
COVER MAYO STAND XLG (DRAPE) ×2 IMPLANT
COVER SURGICAL LIGHT HANDLE (MISCELLANEOUS) ×4 IMPLANT
CUFF TOURNIQUET SINGLE 34IN LL (TOURNIQUET CUFF) ×2 IMPLANT
DRAPE C-ARM FOLDED MOBILE STRL (DRAPES) ×1 IMPLANT
DRILL BIT 2.8MM (BIT) ×2
GAUZE XEROFORM 5X9 LF (GAUZE/BANDAGES/DRESSINGS) ×2 IMPLANT
GLOVE BIO SURGEON STRL SZ8 (GLOVE) ×2 IMPLANT
GLOVE BIO SURGEON STRL SZ8.5 (GLOVE) ×2 IMPLANT
GLOVE ECLIPSE 7.0 STRL STRAW (GLOVE) ×1 IMPLANT
GLOVE EXAM NITRILE MD LF STRL (GLOVE) ×1 IMPLANT
GLOVE INDICATOR 7.0 STRL GRN (GLOVE) ×2 IMPLANT
GOWN STRL REIN XL XLG (GOWN DISPOSABLE) ×6 IMPLANT
INST SET MINOR BONE (KITS) ×2 IMPLANT
K-WIRE 4.0X.028 (WIRE) ×1 IMPLANT
K-wire 9.0" x .062" ×1 IMPLANT
KIT ROOM TURNOVER APOR (KITS) ×2 IMPLANT
MANIFOLD NEPTUNE II (INSTRUMENTS) ×2 IMPLANT
NS IRRIG 1000ML POUR BTL (IV SOLUTION) ×2 IMPLANT
PACK BASIC LIMB (CUSTOM PROCEDURE TRAY) ×2 IMPLANT
PAD ABD 5X9 TENDERSORB (GAUZE/BANDAGES/DRESSINGS) ×2 IMPLANT
PAD ARMBOARD 7.5X6 YLW CONV (MISCELLANEOUS) ×3 IMPLANT
PAD CAST 4YDX4 CTTN HI CHSV (CAST SUPPLIES) IMPLANT
PADDING CAST COTTON 4X4 STRL (CAST SUPPLIES) ×6
PLATE LCP 3.5 1/3 TUB 6HX69 (Plate) ×1 IMPLANT
SCREW CANC FT 12 4.0 (Screw) ×1 IMPLANT
SCREW CORTEX 3.5 14MM (Screw) ×3 IMPLANT
SCREW CORTEX 3.5 20MM (Screw) ×1 IMPLANT
SCREW LOCK CORT ST 3.5X14 (Screw) IMPLANT
SCREW LOCK CORT ST 3.5X20 (Screw) IMPLANT
SCREW LOCK T15 FT 14X3.5X2.9X (Screw) IMPLANT
SCREW LOCKING 3.5X14 (Screw) ×2 IMPLANT
SCREW PROS MALLEO 55 26MM (Screw) ×1 IMPLANT
SET BASIN LINEN APH (SET/KITS/TRAYS/PACK) ×2 IMPLANT
SOL PREP PROV IODINE SCRUB 4OZ (MISCELLANEOUS) ×2 IMPLANT
SPLINT J 5 (CAST SUPPLIES) ×1 IMPLANT
SPONGE GAUZE 4X4 12PLY (GAUZE/BANDAGES/DRESSINGS) ×2 IMPLANT
SPONGE LAP 18X18 X RAY DECT (DISPOSABLE) ×2 IMPLANT
SPONGE LAP 4X18 X RAY DECT (DISPOSABLE) ×1 IMPLANT
STAPLER VISISTAT 35W (STAPLE) ×1 IMPLANT
SUT CHROMIC 2 0 CT 1 (SUTURE) ×3 IMPLANT
SUT NUROLON CT 2 BLK #1 18IN (SUTURE) IMPLANT
SUT PLAIN 2 0 XLH (SUTURE) ×2 IMPLANT
TOWEL OR 17X26 4PK STRL BLUE (TOWEL DISPOSABLE) ×1 IMPLANT

## 2011-08-26 NOTE — ED Notes (Signed)
Pt is resting quiet with no c/o voiced at this time. Morphine 4 mg IV given per verbal order.

## 2011-08-26 NOTE — H&P (Signed)
[name] is an 71 y.o. male.   Chief Complaint: I broke my right ankle HPI: He had injury to the right ankle today.  It is deformed, painful.  Xrays show a bimalleolar fracture with displacement.  He will need surgery. He has no other injury.  I have discussed with him the need for surgery. Risks and imponderables were discussed.  He appears to understand.  I will take him to surgery shortly.  His wife is present and understands also.  Past Medical History  Diagnosis Date  . Hypertension   . Diabetes mellitus     History reviewed. No pertinent past surgical history.  History reviewed. No pertinent family history. Social History:  does not have a smoking history on file. He does not have any smokeless tobacco history on file. His alcohol and drug histories not on file.  Allergies: No Known Allergies  Medications Prior to Admission  Medication Dose Route Frequency Provider Last Rate Last Dose  . 0.9 %  sodium chloride infusion   Intravenous Continuous Felisa Bonier, MD 125 mL/hr at 08/26/11 1303    . HYDROmorphone (DILAUDID) injection 0.5 mg  0.5 mg Intravenous Once Felisa Bonier, MD   0.5 mg at 08/26/11 1302  . ketorolac (TORADOL) 30 MG/ML injection 15 mg  15 mg Intravenous Once Felisa Bonier, MD   15 mg at 08/26/11 1302   Medications Prior to Admission  Medication Sig Dispense Refill  . gemfibrozil (LOPID) 600 MG tablet Take 600 mg by mouth 2 (two) times daily.      Marland Kitchen glimepiride (AMARYL) 2 MG tablet Take 2 mg by mouth at bedtime.      Marland Kitchen lisinopril (PRINIVIL,ZESTRIL) 20 MG tablet Take 20 mg by mouth daily.      . metFORMIN (GLUCOPHAGE) 1000 MG tablet Take 1,000 mg by mouth 2 (two) times daily.      . metoprolol (LOPRESSOR) 100 MG tablet Take 100 mg by mouth 2 (two) times daily.      . pioglitazone (ACTOS) 45 MG tablet Take 45 mg by mouth daily.        Results for orders placed during the hospital encounter of 08/26/11 (from the past 48 hour(s))  CBC     Status:  Abnormal   Collection Time   08/26/11  1:01 PM      Component Value Range Comment   WBC 7.0  4.0 - 10.5 (K/uL)    RBC 4.17 (*) 4.22 - 5.81 (MIL/uL)    Hemoglobin 11.8 (*) 13.0 - 17.0 (g/dL)    HCT 08.6 (*) 57.8 - 52.0 (%)    MCV 84.2  78.0 - 100.0 (fL)    MCH 28.3  26.0 - 34.0 (pg)    MCHC 33.6  30.0 - 36.0 (g/dL)    RDW 46.9  62.9 - 52.8 (%)    Platelets 199  150 - 400 (K/uL)   DIFFERENTIAL     Status: Normal   Collection Time   08/26/11  1:01 PM      Component Value Range Comment   Neutrophils Relative 75  43 - 77 (%)    Neutro Abs 5.2  1.7 - 7.7 (K/uL)    Lymphocytes Relative 17  12 - 46 (%)    Lymphs Abs 1.2  0.7 - 4.0 (K/uL)    Monocytes Relative 7  3 - 12 (%)    Monocytes Absolute 0.5  0.1 - 1.0 (K/uL)    Eosinophils Relative 1  0 - 5 (%)  Eosinophils Absolute 0.1  0.0 - 0.7 (K/uL)    Basophils Relative 0  0 - 1 (%)    Basophils Absolute 0.0  0.0 - 0.1 (K/uL)   BASIC METABOLIC PANEL     Status: Abnormal   Collection Time   08/26/11  1:01 PM      Component Value Range Comment   Sodium 136  135 - 145 (mEq/L)    Potassium 5.7 (*) 3.5 - 5.1 (mEq/L)    Chloride 103  96 - 112 (mEq/L)    CO2 25  19 - 32 (mEq/L)    Glucose, Bld 144 (*) 70 - 99 (mg/dL)    BUN 22  6 - 23 (mg/dL)    Creatinine, Ser 2.95 (*) 0.50 - 1.35 (mg/dL)    Calcium 9.5  8.4 - 10.5 (mg/dL)    GFR calc non Af Amer 32 (*) >90 (mL/min)    GFR calc Af Amer 37 (*) >90 (mL/min)   PROTIME-INR     Status: Normal   Collection Time   08/26/11  1:01 PM      Component Value Range Comment   Prothrombin Time 13.8  11.6 - 15.2 (seconds)    INR 1.04  0.00 - 1.49    APTT     Status: Normal   Collection Time   08/26/11  1:01 PM      Component Value Range Comment   aPTT 36  24 - 37 (seconds)   TYPE AND SCREEN     Status: Normal (Preliminary result)   Collection Time   08/26/11  1:01 PM      Component Value Range Comment   ABO/RH(D) O POS      Antibody Screen PENDING      Sample Expiration 08/29/2011      Dg Ankle  Complete Right  08/26/2011  *RADIOLOGY REPORT*  Clinical Data: Right ankle pain, fall,  RIGHT ANKLE - COMPLETE 3+ VIEW  Comparison: None.  Findings: Oblique fracture noted through the distal right fibula. Transverse fracture through the medial malleolus.  Distal tibia is displaced medial relative to the talus, likely dislocated or subluxed.  IMPRESSION: Distal fibular and medial malleolar fractures with dislocation or subluxation as above.  Original Report Authenticated By: Cyndie Chime, M.D.    Review of Systems  Constitutional: Negative.   HENT: Negative.   Eyes: Negative.   Respiratory: Negative.   Cardiovascular:       History of heart surgery ten years ago.  Gastrointestinal: Negative.   Genitourinary: Negative.   Musculoskeletal: Positive for joint pain and falls (fall today with ankle fracture on the right.  Prior history of old fracture ankle in past.).  Skin: Negative.   Neurological: Negative.   Endo/Heme/Allergies: Negative.   Psychiatric/Behavioral: Negative.     Blood pressure 98/54, pulse 73, temperature 98 F (36.7 C), temperature source Oral, resp. rate 17, height 5\' 11"  (1.803 m), weight 85.73 kg (189 lb), SpO2 98.00%. Physical Exam  Constitutional: He is oriented to person, place, and time. He appears well-developed and well-nourished.  HENT:  Head: Normocephalic and atraumatic.  Eyes: EOM are normal. Pupils are equal, round, and reactive to light.  Neck: Normal range of motion. Neck supple.  Cardiovascular: Normal rate, regular rhythm, normal heart sounds and intact distal pulses.   Respiratory: Effort normal and breath sounds normal.  GI: Bowel sounds are normal.  Musculoskeletal: He exhibits tenderness (right ankle with deformity, external rotation).       Right ankle: He exhibits decreased range  of motion, swelling and deformity. tenderness. Lateral malleolus and medial malleolus tenderness found.       Feet:  Neurological: He is alert and oriented to  person, place, and time. He has normal reflexes.  Skin: Skin is warm and dry.  Psychiatric: He has a normal mood and affect. His behavior is normal. Judgment and thought content normal.     Assessment/Plan Displaced closed bimalleolar fracture of the right ankle.  For surgery today.  Amiliah Campisi 08/26/2011, 3:05 PM

## 2011-08-26 NOTE — ED Notes (Addendum)
Pt states fell off ladder, approx 6 ft while painting house. Pt denies LOC and other injury at this time. Pulses palpable and brisk cap refill noted.  EMS splint intact at this time. Pt transported to Xray.

## 2011-08-26 NOTE — Brief Op Note (Signed)
08/26/2011  5:48 PM  PATIENT:  [name]  71 y.o. male  PRE-OPERATIVE DIAGNOSIS:  Fracture right ankle bimalleolar  POST-OPERATIVE DIAGNOSIS:  Fracture right ankle bimalleolar  PROCEDURE:  Procedure(s) (LRB): OPEN REDUCTION INTERNAL FIXATION (ORIF) ANKLE FRACTURE (Right)  SURGEON:  Surgeon(s) and Role:    * Darreld Mclean, MD - Primary  PHYSICIAN ASSISTANT:   ASSISTANTS: none   ANESTHESIA:   spinal  EBL:  Total I/O In: 200 [I.V.:200] Out: 0   BLOOD ADMINISTERED:none  DRAINS: none   LOCAL MEDICATIONS USED:  NONE  SPECIMEN:  No Specimen  DISPOSITION OF SPECIMEN:  N/A  COUNTS:  YES  TOURNIQUET:   Total Tourniquet Time Documented: Thigh (Right) - 36 minutes  DICTATION: .Other Dictation: Dictation Number [id number]  PLAN OF CARE: Admit to inpatient   PATIENT DISPOSITION:  PACU - hemodynamically stable.   Delay start of Pharmacological VTE agent (>24hrs) due to surgical blood loss or risk of bleeding: no

## 2011-08-26 NOTE — Anesthesia Postprocedure Evaluation (Signed)
  Anesthesia Post-op Note  Patient: [name]  Procedure(s) Performed: Procedure(s) (LRB): OPEN REDUCTION INTERNAL FIXATION (ORIF) ANKLE FRACTURE (Right)  Patient Location: PACU  Anesthesia Type: Spinal  Level of Consciousness: awake, alert  and oriented  Airway and Oxygen Therapy: Patient Spontanous Breathing and Patient connected to nasal cannula oxygen  Post-op Pain: none  Post-op Assessment: Post-op Vital signs reviewed, Patient's Cardiovascular Status Stable, Respiratory Function Stable, Patent Airway and No signs of Nausea or vomiting  Post-op Vital Signs: Reviewed and stable  Complications: No apparent anesthesia complications

## 2011-08-26 NOTE — Anesthesia Preprocedure Evaluation (Addendum)
Anesthesia Evaluation  Patient identified by MRN, date of birth, ID band Patient awake    Reviewed: Allergy & Precautions, H&P , NPO status , Patient's Chart, lab work & pertinent test results, reviewed documented beta blocker date and time   Airway Mallampati: II TM Distance: >3 FB Neck ROM: Full    Dental  (+) Edentulous Upper and Edentulous Lower   Pulmonary former smoker History of collapsed kung following TURP in 2012         Cardiovascular hypertension, + CAD and + CABG Rhythm:Regular Rate:Normal     Neuro/Psych negative psych ROS   GI/Hepatic negative GI ROS, Neg liver ROS,   Endo/Other  Diabetes mellitus-, Well Controlled, Type 2, Oral Hypoglycemic AgentsAccucheck, 118  Renal/GU      Musculoskeletal   Abdominal   Peds  Hematology   Anesthesia Other Findings   Reproductive/Obstetrics                           Anesthesia Physical Anesthesia Plan  ASA: III  Anesthesia Plan: Spinal   Post-op Pain Management:    Induction:   Airway Management Planned: Mask  Additional Equipment:   Intra-op Plan:   Post-operative Plan:   Informed Consent: I have reviewed the patients History and Physical, chart, labs and discussed the procedure including the risks, benefits and alternatives for the proposed anesthesia with the patient or authorized representative who has indicated his/her understanding and acceptance.     Plan Discussed with: Anesthesiologist  Anesthesia Plan Comments: (ASA III for ORIF Rt ankle.  Telephone consult with Dr. Jayme Cloud.  Patient agreeable to SAB with sedation.)        Anesthesia Quick Evaluation

## 2011-08-26 NOTE — ED Provider Notes (Signed)
History  This chart was scribed for Anthony Bonier, MD by Bennett Scrape and Cherlynn Perches. This patient was seen in room APA08/[facility]08 and the patient's care was started at 1:15PM.   CSN: [id number]  Arrival date & time 08/26/11  1136   First MD Initiated Contact with Patient 08/26/11 1245      Chief Complaint  Patient presents with  . Fall  . Ankle Pain    The history is provided by the patient. No language interpreter was used.    [name] is a 71 y.o. male who presents to the Emergency Department complaining of a fall that occurred approximately 30 minutes PTA. Pt states that he jumped approximately 6 feet off a ladder as it was falling over and landed on his butt with his right ankle under him. He c/o right ankle pain. There is an obvious deformity noted. He rates his pain a 9 out of 10 currently. Pt reports that he is not able to put pressure on the right ankle. He denies LOC, head trauma or pain anywhere else. He denies any other injuries or illnesses. He has not taken any medications to improve pain PTA. He denies fever, sore throat, cough, visual disturbance, chest pain, nausea, emesis, HA, dysuria and confusion as associated symptoms. He has a h/o HTN and diabetes.    Past Medical History  Diagnosis Date  . Hypertension   . Diabetes mellitus     History reviewed. No pertinent past surgical history.  History reviewed. No pertinent family history.  History  Substance Use Topics  . Smoking status: Not on file  . Smokeless tobacco: Not on file  . Alcohol Use: Not on file      Review of Systems  Constitutional: Negative for fever and appetite change.  HENT: Negative for congestion, sore throat and neck pain.   Eyes: Negative for pain.  Respiratory: Negative for cough and shortness of breath.   Cardiovascular: Negative for chest pain.  Gastrointestinal: Negative for nausea, vomiting, abdominal pain and diarrhea.  Genitourinary: Negative for dysuria, urgency  and hematuria.  Musculoskeletal: Negative for back pain.       Right ankle pain  Skin: Negative for rash.  Neurological: Negative for seizures and headaches.  Psychiatric/Behavioral: Negative for confusion.    Allergies  Review of patient's allergies indicates no known allergies.  Home Medications  No current outpatient prescriptions on file.  Triage Vitals: BP 98/54  Pulse 73  Temp(Src) 98 F (36.7 C) (Oral)  Resp 17  Ht 5\' 11"  (1.803 m)  Wt 189 lb (85.73 kg)  BMI 26.36 kg/m2  SpO2 98%  Physical Exam  Nursing note and vitals reviewed. Constitutional: He is oriented to person, place, and time. He appears well-developed and well-nourished. No distress.  HENT:  Head: Normocephalic and atraumatic.  Eyes: Conjunctivae and EOM are normal. Pupils are equal, round, and reactive to light.  Neck: Normal range of motion. Neck supple.  Cardiovascular: Normal rate, regular rhythm and normal heart sounds.  Exam reveals no gallop and no friction rub.   No murmur heard.      Thready dorsalis pedis pulse  Pulmonary/Chest: Effort normal and breath sounds normal. No respiratory distress. He has no wheezes. He has no rales.       Lungs are clear to auscultation, no rhonchi  Abdominal: Soft. Bowel sounds are normal. He exhibits no distension. There is no tenderness. There is no rebound and no guarding.  Musculoskeletal: He exhibits edema and tenderness.  Right hip: Normal.       Left hip: Normal.       Right knee: Normal.       Left knee: Normal.       Right ankle: He exhibits decreased range of motion, swelling, deformity and abnormal pulse. He exhibits no ecchymosis and no laceration. tenderness. Lateral malleolus, medial malleolus, AITFL, CF ligament and posterior TFL tenderness found. No head of 5th metatarsal and no proximal fibula tenderness found.       Left ankle: Normal.       Cervical back: Normal.       Thoracic back: Normal.       Lumbar back: Normal.       Legs:      Pt  has a significant deformity present at the medial malleolus of the right foot with external rotation that is not compatible with the normal anatomy and is suggestive of gross dislocation amidst apparent bimalleolar fracture; pt is able to move toes of the right foot demonstrating preserved motor function. Pt has no edema or tenderness to the proximal right knee; there is no apparent abnormality or deformity of the right knee, there is no instability and no proximal Fibular tenderness. The pelvis is stable; pt has no pain, there is no deformity at hips or pelvis. Pt has no pain or deformity within the left lower extremity.  Neurological: He is alert and oriented to person, place, and time. He has normal reflexes. No cranial nerve deficit. He exhibits normal muscle tone.       Pt's sensation is intact in the right foot distal to the injury and he is able to wiggle his toes to demonstrate preserved motor function as well.  Skin: Skin is warm and dry. No rash noted. No erythema. No pallor.       Good capillary refill in the right toes, right foot is warm to the touch  Psychiatric: He has a normal mood and affect. His behavior is normal. Judgment and thought content normal.    ED Course  Procedures (including critical care time)  DIAGNOSTIC STUDIES: Oxygen Saturation is 98% on room air, normal by my interpretation.    COORDINATION OF CARE: 1:23PM-Discussed radiology report with pt and pt acknowledged results. Discussed pain medications and orthopedic follow up with probable need for surgey with pt and agreed.    Labs Reviewed  CBC  DIFFERENTIAL  BASIC METABOLIC PANEL  PROTIME-INR  APTT  TYPE AND SCREEN   Dg Ankle Complete Right  08/26/2011  *RADIOLOGY REPORT*  Clinical Data: Right ankle pain, fall,  RIGHT ANKLE - COMPLETE 3+ VIEW  Comparison: None.  Findings: Oblique fracture noted through the distal right fibula. Transverse fracture through the medial malleolus.  Distal tibia is displaced  medial relative to the talus, likely dislocated or subluxed.  IMPRESSION: Distal fibular and medial malleolar fractures with dislocation or subluxation as above.  Original Report Authenticated By: Cyndie Chime, M.D.     No diagnosis found.    MDM   evident closed right bimalleolar fracture with dislocation, it disruption, and instability of the mortise. The patient's pain is relieved at this time and his vital signs are stable. I have consulted Dr. Hilda Lias, the patient's orthopedic surgeon who operated on his left ankle several years ago. Dr. Hilda Lias has presented to the emergency department, evaluated the patient, and is taking him to the operating room for reduction, stabilization, and fixation of the right ankle fracture dislocation.    I personally performed  the services described in this documentation, which was scribed in my presence. The recorded information has been reviewed and considered.    Anthony Bonier, MD 08/26/11 [id number]

## 2011-08-26 NOTE — Anesthesia Procedure Notes (Addendum)
Spinal  Patient location during procedure: OR Start time: 08/26/2011 4:01 PM Staffing CRNA/Resident: Glynn Octave E Preanesthetic Checklist Completed: patient identified, site marked, surgical consent, pre-op evaluation, timeout performed, IV checked, risks and benefits discussed and monitors and equipment checked Spinal Block Patient position: right lateral decubitus Prep: Betadine Patient monitoring: heart rate, cardiac monitor, continuous pulse ox and blood pressure Approach: right paramedian Location: L3-4 Injection technique: single-shot Needle Needle type: Spinocan  Needle gauge: 22 G Needle length: 9 cm Assessment Sensory level: T8 Additional Notes ATTEMPTS:1 TRAY ID: [id number] TRAY EXPIRATION DATE: 11/2012

## 2011-08-26 NOTE — ED Notes (Signed)
Pt transported to pre-op via stretcher., report given to Transporting rn.

## 2011-08-26 NOTE — ED Notes (Signed)
Pt fell approx 6 feet off ladder and now had obvious deformity to r ankle. Denies hitting head or pain elsewhere. Rating pain 9. Leg is immobilized. Pedal pulse present. Swelling noted.

## 2011-08-26 NOTE — Transfer of Care (Signed)
Immediate Anesthesia Transfer of Care Note  Patient: [name]  Procedure(s) Performed: Procedure(s) (LRB): OPEN REDUCTION INTERNAL FIXATION (ORIF) ANKLE FRACTURE (Right)  Patient Location: PACU  Anesthesia Type: Spinal  Level of Consciousness: awake, alert  and oriented  Airway & Oxygen Therapy: Patient Spontanous Breathing and Patient connected to nasal cannula oxygen  Post-op Assessment: Report given to PACU RN  Post vital signs: Reviewed and stable  Complications: No apparent anesthesia complications

## 2011-08-26 NOTE — ED Notes (Addendum)
Dr Doylene Canard notified of positive xray results for ankle fracture. Pt returned from radiology with splint placed by EMS off.

## 2011-08-27 LAB — CBC
Hemoglobin: 10.5 g/dL — ABNORMAL LOW (ref 13.0–17.0)
MCHC: 33.8 g/dL (ref 30.0–36.0)
RDW: 13.7 % (ref 11.5–15.5)
WBC: 6.2 10*3/uL (ref 4.0–10.5)

## 2011-08-27 LAB — BASIC METABOLIC PANEL
Chloride: 107 mEq/L (ref 96–112)
GFR calc Af Amer: 40 mL/min — ABNORMAL LOW (ref >90)
Potassium: 4.9 mEq/L (ref 3.5–5.1)

## 2011-08-27 LAB — GLUCOSE, CAPILLARY
Glucose-Capillary: 118 mg/dL — ABNORMAL HIGH (ref 70–99)
Glucose-Capillary: 130 mg/dL — ABNORMAL HIGH (ref 70–99)
Glucose-Capillary: 181 mg/dL — ABNORMAL HIGH (ref 70–99)
Glucose-Capillary: 157 mg/dL — ABNORMAL HIGH (ref 70–99)
Glucose-Capillary: 149 mg/dL — ABNORMAL HIGH (ref 70–99)

## 2011-08-27 LAB — DIFFERENTIAL
Basophils Absolute: 0 10*3/uL (ref 0.0–0.1)
Basophils Relative: 0 % (ref 0–1)
Lymphocytes Relative: 20 % (ref 12–46)
Monocytes Relative: 9 % (ref 3–12)
Neutro Abs: 4.3 10*3/uL (ref 1.7–7.7)
Neutrophils Relative %: 69 % (ref 43–77)

## 2011-08-27 MED ORDER — ACETAMINOPHEN 10 MG/ML IV SOLN
1000.0000 mg | Freq: Four times a day (QID) | INTRAVENOUS | Status: AC
  Administered 2011-08-27: 1000 mg via INTRAVENOUS
  Filled 2011-08-27 (×2): qty 100

## 2011-08-27 MED ORDER — ALBUTEROL SULFATE (5 MG/ML) 0.5% IN NEBU: 2.5000 mg | INHALATION_SOLUTION | RESPIRATORY_TRACT | Status: DC | PRN

## 2011-08-27 MED ORDER — SODIUM CHLORIDE 0.9 % IJ SOLN
INTRAMUSCULAR | Status: AC
  Administered 2011-08-27: 12:00:00
  Filled 2011-08-27: qty 3

## 2011-08-27 NOTE — Progress Notes (Signed)
   CARE MANAGEMENT NOTE 08/27/2011  Patient:  [name], [name]   Account Number:  [id number]  Date Initiated:  08/27/2011  Documentation initiated by:  Sharrie Rothman  Subjective/Objective Assessment:   Pt admitted from home with fx right ankle. Pt lives with wife and was independent PTA. Pt had surgery to correct the ankle.     Action/Plan:   CM to arrange [facility] PT and equipment needs for pt at discharge. Awaiting PT consult notes.   Anticipated DC Date:  09/02/2011   Anticipated DC Plan:  HOME W HOME HEALTH SERVICES      DC Planning Services  CM consult      Choice offered to / List presented to:             Status of service:  In process, will continue to follow Medicare Important Message given?   (If response is "NO", the following Medicare IM given date fields will be blank) Date Medicare IM given:   Date Additional Medicare IM given:    Discharge Disposition:  HOME W HOME HEALTH SERVICES  Per UR Regulation:    If discussed at Long Length of Stay Meetings, dates discussed:    Comments:

## 2011-08-27 NOTE — Anesthesia Postprocedure Evaluation (Signed)
Anesthesia Post Note  Patient: [name]  Procedure(s) Performed: Procedure(s) (LRB): OPEN REDUCTION INTERNAL FIXATION (ORIF) ANKLE FRACTURE (Right)  Anesthesia type: Spinal  Patient location: 306  Post pain: Pain level controlled  Post assessment: Post-op Vital signs reviewed, Patient's Cardiovascular Status Stable, Respiratory Function Stable, Patent Airway, No signs of Nausea or vomiting and Pain level controlled  Last Vitals:  Filed Vitals:   08/27/11 1252  BP: 95/56  Pulse: 74  Temp: 36.4 C  Resp: 18    Post vital signs: Reviewed and stable  Level of consciousness: awake and alert   Complications: No apparent anesthesia complications

## 2011-08-27 NOTE — Progress Notes (Signed)
Subjective: 1 Day Post-Op Procedure(s) (LRB): OPEN REDUCTION INTERNAL FIXATION (ORIF) ANKLE FRACTURE (Right) Patient reports pain as 2 on 0-10 scale.    Objective: Vital signs in last 24 hours: Temp:  [97.2 F (36.2 C)-98 F (36.7 C)] 97.2 F (36.2 C) (04/12 0521) Pulse Rate:  [61-73] 64  (04/12 0521) Resp:  [14-20] 20  (04/12 0521) BP: (98-147)/(52-71) 129/64 mmHg (04/12 0521) SpO2:  [93 %-100 %] 99 % (04/12 0521) Weight:  [85.73 kg (189 lb)] 85.73 kg (189 lb) (04/11 1601)  Intake/Output from previous day: 04/11 0701 - 04/12 0700 In: 2950 [P.O.:510; I.V.:2440] Out: 100 [Urine:100] Intake/Output this shift:     Basename 08/27/11 0533 08/26/11 1301  HGB 10.5* 11.8*    Basename 08/27/11 0533 08/26/11 1301  WBC 6.2 7.0  RBC 3.68* 4.17*  HCT 31.1* 35.1*  PLT 161 199    Basename 08/27/11 0533 08/26/11 1301  NA 136 136  K 4.9 5.7*  CL 107 103  CO2 24 25  BUN 21 22  CREATININE 1.89* 2.01*  GLUCOSE 158* 144*  CALCIUM 8.5 9.5    Basename 08/26/11 1301  LABPT --  INR 1.04    Neurologically intact Neurovascular intact Sensation intact distally Intact pulses distally  He has little pain.  He will have PT today.  If he does well, can send home tomorrow.  Assessment/Plan: 1 Day Post-Op Procedure(s) (LRB): OPEN REDUCTION INTERNAL FIXATION (ORIF) ANKLE FRACTURE (Right) Up with therapy  Taelon Bendorf 08/27/2011, 7:22 AM

## 2011-08-27 NOTE — Addendum Note (Signed)
Addendum  created 08/27/11 1533 by Sincere Berlanga S Xayla Puzio, CRNA   Modules edited:Notes Section    

## 2011-08-27 NOTE — Evaluation (Addendum)
Physical Therapy Evaluation Patient Details Name: [name] MRN: [id number] DOB: April 21, 1941 Today's Date: 08/27/2011  Problem List:  Patient Active Problem List  Diagnoses  . DIABETES MELLITUS, TYPE II  . CAD, UNSPECIFIED SITE  . CHEST PAIN-UNSPECIFIED    Past Medical History:  Past Medical History  Diagnosis Date  . Hypertension   . Diabetes mellitus   . Collapsed lung    Past Surgical History:  Past Surgical History  Procedure Date  . Coronary artery bypass graft 2003  . Cholecystectomy 2008  . Prostate surgery 2012    Enlarged prostate  . Ankle fracture surgery 2008    [facility]  . Eye surgery 2001    Cataracts removed bilaterally  . Appendectomy 1960's  . Knee surgery 1993    [place]ap[place]    PT Assessment/Plan/Recommendation PT Assessment Clinical Impression Statement: Pt is a 71 year old male referred to PT s/p R ankle ORIF from bimallelar fx.  Prior to admission he was a healthy independent individual.  He has maintained appropraite strength to her LLE.  Attempted crutch training today as I believe pt will eventually benefit from crutches, however becasue of lines and leads and gown wear made ambulation difficult today.  Will attempt to ambulate with RW next visit, however pt will benefit from crutch use to go up and down stairs to enter his home.   PT Recommendation/Assessment: Patient will need skilled PT in the acute care venue PT Problem List: Decreased activity tolerance;Decreased balance;Decreased mobility PT Therapy Diagnosis : Difficulty walking PT Plan PT Frequency: 7X/week PT Treatment/Interventions: Gait training;DME instruction;Stair training;Functional mobility training;Therapeutic activities;Therapeutic exercise;Balance training;Neuromuscular re-education;Patient/family education PT Recommendation Follow Up Recommendations: Home health PT Equipment Recommended: Rolling walker with 5" wheels PT Goals  Acute  Rehab PT Goals PT Goal Formulation: With patient Time For Goal Achievement: 5 days Pt will go Sit to Stand: with supervision PT Goal: Sit to Stand - Progress: Goal set today Pt will go Stand to Sit: with modified independence PT Goal: Stand to Sit - Progress: Goal set today Pt will Ambulate: 51 - 150 feet;with supervision;with crutches;with rolling walker (RW vs. crutches) Pt will Go Up / Down Stairs: 3-5 stairs;with supervision;with least restrictive assistive device PT Goal: Up/Down Stairs - Progress: Goal set today  PT Evaluation Precautions/Restrictions  Restrictions Weight Bearing Restrictions: Yes RLE Weight Bearing: Touchdown weight bearing (per PT) Other Position/Activity Restrictions: Toe Touch WB Prior Functioning  Home Living Lives With: Spouse Type of Home: House Home Access: Stairs to enter Secretary/administrator of Steps: 3 Entrance Stairs-Rails: Can reach both Home Layout: One level Bathroom Shower/Tub: Health visitor: Handicapped height Home Adaptive Equipment: Environmental consultant - rolling;Straight cane Prior Function Level of Independence: Independent Able to Take Stairs?: Yes Driving: Yes Vocation: Retired Designer, television/film set Level: Oriented X4 Sensation/Coordination   Extremity Assessment RLE Assessment RLE Assessment: Within Functional Limits (for available hip and knee motions) LLE Assessment LLE Assessment: Within Functional Limits Mobility (including Balance) Bed Mobility Bed Mobility: Yes Supine to Sit: 5: Supervision Supine to Sit Details (indicate cue type and reason): cueing for hand placement Sitting - Scoot to Edge of Bed: 6: Modified independent (Device/Increase time) Transfers Transfers: Yes Sit to Stand: 3: Mod assist Sit to Stand Details (indicate cue type and reason): from slightly elevated bed height w/use of Bilateral Axillary Crutches.  Instruction on crutch use  Stand to Sit: 6: Modified independent  (Device/Increase time) Ambulation/Gait Ambulation/Gait: Yes Ambulation/Gait Assistance: 4: Min assist Ambulation/Gait Assistance  Details (indicate cue type and reason): cueing for gait sequence with crutches.  Difficulty secondary to multiple lines and leads for pain control  Ambulation Distance (Feet): 8 Feet Assistive device: Crutches Gait Pattern: Step-to pattern (w/crutches) Stairs: No    Exercise  Total Joint Exercises Ankle Circles/Pumps: AROM;Both;20 reps Heel Slides: AROM;Both;10 reps End of Session PT - End of Session Equipment Utilized During Treatment: Gait belt Activity Tolerance: Patient tolerated treatment well Patient left: in bed (Sitting EOB w/nursing) Nurse Communication: Mobility status for transfers;Mobility status for ambulation General Behavior During Session: [facility] for tasks performed Cognition: [facility] for tasks performed Patient Class: Inpatient Shayde Gervacio 08/27/2011, 12:23 PM

## 2011-08-27 NOTE — Op Note (Signed)
NAMELOMAN, LOGAN NO.:  [id number]  MEDICAL RECORD NO.:  [id number]  LOCATION:  A306                          FACILITY:  [facility]  PHYSICIAN:  J. Darreld Mclean, M.D. DATE OF BIRTH:  April 12, 1941  DATE OF PROCEDURE: DATE OF DISCHARGE:                              OPERATIVE REPORT   PREOPERATIVE DIAGNOSIS:  Bimalleolar fracture of the right ankle, displaced.  POSTOPERATIVE DIAGNOSIS:  Bimalleolar fracture of the right ankle, displaced.  PROCEDURE:  Open treatment internal reduction of right bimalleolar fracture of the ankle involving both medial and lateral malleoli.  A 6- hole plate placed laterally on a Kirschner wire and malleolar screw was inserted medially.  ANESTHESIA:  Spinal.  TOURNIQUET TIME:  Thirty-two minutes.  DRAINS:  No drains.  Posterior splint applied at the end of the procedure.  SURGEON:  J. Darreld Mclean, M.D.  INDICATION:  The patient had an injury today to his right ankle with bimalleolar fracture displaced, was seen in the ER.  The fracture was identified.  His ankle is laterally rotated.  He had pain and tenderness.  He had no other injury.  I talked to the patient and his wife about the injury.  He had not eaten since 8 or 10 o'clock this morning, and I recommend that we go ahead and proceed with surgery today.  He was agreeable to this.  Risks and imponderables of the procedure were discussed preoperatively.  They both understood and both agreed with the procedure.  The patient was seen in the holding area, identified the right angle as correct surgical site and placed a mark on the right ankle.  He was brought back to the operating room, given spinal anesthesia, then placed supine on the operating room table.  Tourniquet placed, deflated right upper thigh.  A sandbag was placed under the right hip.  The patient was prepped and draped in usual manner.  He had a radiological time-out, everyone had lead aprons on,  thyroid shields on, x-ray identification.  Badges and everyone knew each other. The C-arm was brought in and was working properly.  Then, we had a generalized time-out for the patient as Mr. Nicklaus, we are doing his right ankle for bimalleolar fracture of the ankle.  All instrumentation was properly positioned and working.  The OR team knew each other.  The leg was elevated, wrapped circumferentially with Esmarch bandage. Tourniquet inflated to 300 mmHg.  Esmarch bandage removed.  Outline for incision was made medially and laterally.  Medially, the first incision was made.  The fracture was identified and cleaned of hematoma and debris.  The fracture was held in place with a clamp and a 0.062 smooth Kirschner wire was placed and then a 3.2 drill was used and a 4.5 mm, 55 mm long malleolar screw was inserted.  The C-arm fluoroscopy unit showed the medial malleolar fracture reduced nicely.  Attention directed to the lateral side.  Lateral side was exposed, cleaned of hematoma and debris, filled in place with the clamp, 6-hole side plate.  A 6-side plate was put and it looked like it fit nicely.  I used the 2.8 to hold the second screw from the  proximal end in place and then drilled holes with the fracture reduced using 3.5 cancellous screws for the proximal hole of the third, fourth, and fifth holes from proximal to distal, and then the most distal screw, I used a cancellous screw.  Screw sizes measured 14- 20 mm.  A locking screw was then placed.  Final x-rays were taken, showed the fracture was well reduced. Ankle mortise was restored.  Ankle was stable.  Wound was reapproximated using 2-0 chromic and 2-0 plain and then skin staples, both medial laterally.  Tourniquet deflated after 32 minutes and bulky sterile dressing applied.  Sheet cotton applied with ABD pads around the ankle and then the fracture sites, it was well padded up and a posterior plaster splint was applied.  ACE bandage  was applied loosely.  The patient tolerated the procedure well, will go to recovery in good condition.  Physical therapy tomorrow.          ______________________________ Shela Commons. Darreld Mclean, M.D.     JWK/MEDQ  D:  08/26/2011  T:  08/27/2011  Job:  [id number]

## 2011-08-27 NOTE — Progress Notes (Signed)
   CARE MANAGEMENT NOTE 08/27/2011  Patient:  [name], [name]   Account Number:  [id number]  Date Initiated:  08/27/2011  Documentation initiated by:  Sharrie Rothman  Subjective/Objective Assessment:   Pt admitted from home with fx right ankle. Pt lives with wife and was independent PTA. Pt had surgery to correct the ankle.     Action/Plan:   CM to arrange [facility] PT and equipment needs for pt at discharge. Awaiting PT consult notes.   Anticipated DC Date:  09/02/2011   Anticipated DC Plan:  HOME W HOME HEALTH SERVICES      DC Planning Services  CM consult      [facility] Choice  HOME HEALTH   Choice offered to / List presented to:  C-1 Patient        HH arranged  HH-2 PT      [facility] agency  [facility]   Status of service:  Completed, signed off Medicare Important Message given?   (If response is "NO", the following Medicare IM given date fields will be blank) Date Medicare IM given:   Date Additional Medicare IM given:    Discharge Disposition:  HOME W HOME HEALTH SERVICES  Per UR Regulation:    If discussed at Long Length of Stay Meetings, dates discussed:    Comments:  08/27/11 1439 Arlyss Queen, RN BSN CM PT recommended [facility] PT and rolling walker for pt at discharge. Pt stated that he did not need the rolling walker. PT has provided pt crutches. Pt did choose [facility] for PT. Alroy Bailiff of [facility] is aware and will collect pts information from the chart. Pt and pts nurse has been made aware of pt discharge arrangements if pt is discharged over the weekend.

## 2011-08-27 NOTE — Progress Notes (Signed)
UR Chart Review Completed  

## 2011-08-28 LAB — BASIC METABOLIC PANEL WITH GFR
BUN: 15 mg/dL (ref 6–23)
CO2: 24 meq/L (ref 19–32)
Calcium: 8.5 mg/dL (ref 8.4–10.5)
Chloride: 109 meq/L (ref 96–112)
Creatinine, Ser: 1.64 mg/dL — ABNORMAL HIGH (ref 0.50–1.35)
GFR calc Af Amer: 47 mL/min — ABNORMAL LOW
GFR calc non Af Amer: 40 mL/min — ABNORMAL LOW
Glucose, Bld: 67 mg/dL — ABNORMAL LOW (ref 70–99)
Potassium: 5 meq/L (ref 3.5–5.1)
Sodium: 138 meq/L (ref 135–145)

## 2011-08-28 LAB — CBC
MCHC: 33.1 g/dL (ref 30.0–36.0)
Platelets: 164 10*3/uL (ref 150–400)
RDW: 13.8 % (ref 11.5–15.5)

## 2011-08-28 LAB — DIFFERENTIAL
Basophils Absolute: 0 K/uL (ref 0.0–0.1)
Basophils Relative: 0 % (ref 0–1)
Eosinophils Absolute: 0.2 K/uL (ref 0.0–0.7)
Eosinophils Relative: 2 % (ref 0–5)
Lymphocytes Relative: 13 % (ref 12–46)
Lymphs Abs: 0.9 K/uL (ref 0.7–4.0)
Monocytes Absolute: 0.8 K/uL (ref 0.1–1.0)
Monocytes Relative: 11 % (ref 3–12)
Neutro Abs: 4.9 K/uL (ref 1.7–7.7)
Neutrophils Relative %: 73 % (ref 43–77)

## 2011-08-28 LAB — GLUCOSE, CAPILLARY: Glucose-Capillary: 105 mg/dL — ABNORMAL HIGH (ref 70–99)

## 2011-08-28 MED ORDER — HYDROCODONE-ACETAMINOPHEN 7.5-325 MG PO TABS: 1.0000 | ORAL_TABLET | ORAL | Status: AC | PRN

## 2011-08-28 NOTE — Progress Notes (Signed)
08/28/11 1156 Patient being discharged home. Home health for RN/PT set up with [facility], everything in place and will call patient. Patient has crutches provided per PT for discharge home. Reviewed discharge instructions with patient, given copy of instructions, med list, prescription, f/u appointment information via teachback method with patient and wife. Verbalized understanding. Stated pain controlled at this time, denies shortness of breath or other discomfort. Neurovascular checks to right lower ext within normal limits. Pt in stable condition awaiting discharge.

## 2011-08-28 NOTE — Progress Notes (Addendum)
08/28/11 1700 Late entry for 08/28/11 0800. Patient had low CBG of 57 this am before breakfast, given orange juice and breakfast tray. No symptoms of hypoglycemia noted, patient stated "felt fine". Rechecked CBG 105. Nursing monitored.

## 2011-08-28 NOTE — Discharge Summary (Signed)
Physician Discharge Summary  Patient ID: [name] MRN: [id number] DOB/AGE: [id number] 72 y.o.  Admit date: 08/26/2011 Discharge date: 08/28/2011  Admission Diagnoses: Bimalleolar fracture of the right ankle   Discharge Diagnoses: Bimalleolar fracture of the right anklt.  Active Problems:  * No active hospital problems. *    Discharged Condition: good  Hospital Course: He had an injury at home and sustained a bimalleolar fracture of the right ankle.  He was seen in the ER.  X-rays showed the bimalleolar fracture.  He was evaluated in the ER.  I was consulted.  I took him to surgery for repair and fixation.  He tolerated the procedure well that evening.  He remained afebrile in the hospital.  He has been seen by physical therapy and did well.  He prefers crutches over a walker.  He has had little pain.  His labs have remained stable.  Home health has been arranged.  His vital signs post surgery remained stable.  He will be seen in my office April 25.  Consults: Dr. Ouida Sills  Significant Diagnostic Studies: labs: were all normal and radiology: X-Ray: showed the bimalleolar fracture and the post operative fixation.  Treatments: antibiotics: Ancef and surgery: for open treatment and internal fixation of the right bimalleolar fracture of the ankle.  Discharge Exam: Blood pressure 136/76, pulse 71, temperature 97.9 F (36.6 C), temperature source Oral, resp. rate 18, height 5\' 11"  (1.803 m), weight 85.73 kg (189 lb), SpO2 96.00%. At time of discharge he is afebrile, vital signs stable with little to no pain.  He is independent on crutches and his right lower leg is in a posterior splint.  Disposition: 01-Home or Self Care  Discharge Orders    Future Orders Please Complete By Expires   Diet - low sodium heart healthy      Call MD / Call 911      Comments:   If you experience chest pain or shortness of breath, CALL 911 and be transported to the hospital emergency room.  If you develope a  fever above 101 F, pus (white drainage) or increased drainage or redness at the wound, or calf pain, call your surgeon's office.   Constipation Prevention      Comments:   Drink plenty of fluids.  Prune juice may be helpful.  You may use a stool softener, such as Colace (over the counter) 100 mg twice a day.  Use MiraLax (over the counter) for constipation as needed.   Increase activity slowly as tolerated      Weight Bearing as taught in Physical Therapy      Comments:   Use a walker or crutches as instructed.   Discharge instructions      Comments:   Elevate right foot/ankle.  Keep dry.  Use crutches.  Keep appointment with Dr. Hilda Lias April 25 in the afternoon.  Call if any problem:  Office [phone].  Hospital after hours at [phone].     Medication List  As of 08/28/2011  9:42 AM   TAKE these medications         aspirin EC 81 MG tablet   Take 81 mg by mouth daily.      gemfibrozil 600 MG tablet   Commonly known as: LOPID   Take 600 mg by mouth 2 (two) times daily.      glimepiride 2 MG tablet   Commonly known as: AMARYL   Take 2 mg by mouth at bedtime.      HYDROcodone-acetaminophen  7.5-325 MG per tablet   Commonly known as: NORCO   Take 1 tablet by mouth every 4 (four) hours as needed for pain (10 days between refills.).      lisinopril 20 MG tablet   Commonly known as: PRINIVIL,ZESTRIL   Take 20 mg by mouth daily.      metFORMIN 1000 MG tablet   Commonly known as: GLUCOPHAGE   Take 1,000 mg by mouth 2 (two) times daily.      metoprolol 100 MG tablet   Commonly known as: LOPRESSOR   Take 100 mg by mouth 2 (two) times daily.      pioglitazone 45 MG tablet   Commonly known as: ACTOS   Take 45 mg by mouth daily.           Follow-up Information    Follow up with Carylon Perches, MD .       Also see Dr. Hilda Lias on April 25.  SignedDarreld Mclean 08/28/2011, 9:42 AM

## 2011-08-28 NOTE — Progress Notes (Signed)
Subjective: 2 Days Post-Op Procedure(s) (LRB): OPEN REDUCTION INTERNAL FIXATION (ORIF) ANKLE FRACTURE (Right) Patient reports pain as 1 on 0-10 scale.    Objective: Vital signs in last 24 hours: Temp:  [97.6 F (36.4 C)-98.3 F (36.8 C)] 97.9 F (36.6 C) (04/13 0609) Pulse Rate:  [69-78] 71  (04/13 0856) Resp:  [12-24] 18  (04/13 0856) BP: (95-147)/(56-76) 136/76 mmHg (04/13 0609) SpO2:  [93 %-100 %] 96 % (04/13 0856)  Intake/Output from previous day: 04/12 0701 - 04/13 0700 In: 3397.9 [P.O.:600; I.V.:2697.9; IV Piggyback:100] Out: 2200 [Urine:2200] Intake/Output this shift: Total I/O In: -  Out: 200 [Urine:200]   Basename 08/28/11 0630 08/27/11 0533 08/26/11 1301  HGB 9.9* 10.5* 11.8*    Basename 08/28/11 0630 08/27/11 0533  WBC 6.7 6.2  RBC 3.52* 3.68*  HCT 29.9* 31.1*  PLT 164 161    Basename 08/28/11 0630 08/27/11 0533  NA 138 136  K 5.0 4.9  CL 109 107  CO2 24 24  BUN 15 21  CREATININE 1.64* 1.89*  GLUCOSE 67* 158*  CALCIUM 8.5 8.5    Basename 08/26/11 1301  LABPT --  INR 1.04    Neurologically intact Neurovascular intact Sensation intact distally Intact pulses distally  Assessment/Plan: 2 Days Post-Op Procedure(s) (LRB): OPEN REDUCTION INTERNAL FIXATION (ORIF) ANKLE FRACTURE (Right) Discharge home with home health  I will see in office in about 12 days.  Takeyah Wieman 08/28/2011, 9:35 AM

## 2011-08-28 NOTE — Discharge Instructions (Signed)
Ankle Fracture A fracture is a break in the bone. A cast or splint is used to protect and keep your injured bone from moving.  HOME CARE INSTRUCTIONS   Use your crutches as directed.   To lessen the swelling, keep the injured leg elevated while sitting or lying down.   Apply ice to the injury for 15 to 20 minutes, 3 to 4 times per day while awake for 2 days. Put the ice in a plastic bag and place a thin towel between the bag of ice and your cast.   If you have a plaster or fiberglass cast:   Do not try to scratch the skin under the cast using sharp or pointed objects.   Check the skin around the cast every day. You may put lotion on any red or sore areas.   Keep your cast dry and clean.   If you have a plaster splint:   Wear the splint as directed.   You may loosen the elastic around the splint if your toes become numb, tingle, or turn cold or blue.   Do not put pressure on any part of your cast or splint; it may break. Rest your cast only on a pillow the first 24 hours until it is fully hardened.   Your cast or splint can be protected during bathing with a plastic bag. Do not lower the cast or splint into water.   Take medications as directed by your caregiver. Only take over-the-counter or prescription medicines for pain, discomfort, or fever as directed by your caregiver.   Do not drive a vehicle until your caregiver specifically tells you it is safe to do so.   If your caregiver has given you a follow-up appointment, it is very important to keep that appointment. Not keeping the appointment could result in a chronic or permanent injury, pain, and disability. If there is any problem keeping the appointment, you must call back to this facility for assistance.  SEEK IMMEDIATE MEDICAL CARE IF:   Your cast gets damaged or breaks.   You have continued severe pain or more swelling than you did before the cast was put on.   Your skin or toenails below the injury turn blue or gray,  or feel cold or numb.   There is a bad smell or new stains and/or purulent (pus like) drainage coming from under the cast.  If you do not have a window in your cast for observing the wound, a discharge or minor bleeding may show up as a stain on the outside of your cast. Report these findings to your caregiver. MAKE SURE YOU:   Understand these instructions.   Will watch your condition.   Will get help right away if you are not doing well or get worse.  Document Released: 04/30/2000 Document Revised: 04/22/2011 Document Reviewed: 12/05/2007 ExitCare Patient Information 2012 ExitCare, LLCCast or Splint Care Casts and splints support injured limbs and keep bones from moving while they heal.  HOME CARE  Keep the cast or splint uncovered during the drying period.   A plaster cast can take 24 to 48 hours to dry.   A fiberglass cast will dry in less than 1 hour.   Do not rest the cast on anything harder than a pillow for 24 hours.   Do not put weight on your injured limb. Do not put pressure on the cast. Wait for your doctor's approval.   Keep the cast or splint dry.   Cover the   cast or splint with a plastic bag during baths or wet weather.   If you have a cast over your chest and belly (trunk), take sponge baths until the cast is taken off.   Keep your cast or splint clean. Wash a dirty cast with a damp cloth.   Do not put any objects under your cast or splint. Do not scratch the skin under the cast with an object.   Do not take out the padding from inside your cast.   Exercise your joints near the cast as told by your doctor.   Raise (elevate) your injured limb on 1 or 2 pillows for the first 1 to 3 days.  GET HELP RIGHT AWAY IF:  Your cast or splint cracks.   Your cast or splint is too tight or too loose.   You itch badly under the cast.   Your cast gets wet or has a soft spot.   You have a bad smell coming from the cast.   You get an object stuck under the cast.     Your skin around the cast becomes red or raw.   You have new or more pain after the cast is put on.   You have fluid leaking through the cast.   You cannot move your fingers or toes.   Your fingers or toes turn colors or are cool, painful, or puffy (swollen).   You have tingling or lose feeling (numbness) around the injured area.   You have pain or pressure under the cast.   You have trouble breathing or have shortness of breath.   You have chest pain.  MAKE SURE YOU:  Understand these instructions.   Will watch your condition.   Will get help right away if you are not doing well or get worse.  Document Released: 09/02/2010 Document Revised: 04/22/2011 Document Reviewed: 09/02/2010 ExitCare Patient Information 2012 [employer].. 

## 2011-08-30 NOTE — Consult Note (Signed)
NAMEVISENTE, KIRKER NO.:  [id number]  MEDICAL RECORD NO.:  [id number]  LOCATION:                                 FACILITY:  PHYSICIAN:  Kingsley Callander. Ouida Sills, MD       DATE OF BIRTH:  [birth year]  DATE OF CONSULTATION:  08/27/2011 DATE OF DISCHARGE:                                CONSULTATION   CHIEF COMPLAINT:  Ankle fracture.  HISTORY OF PRESENT ILLNESS:  This patient is a 71 year old, white male, who presented to the emergency room by ambulance yesterday with a fracture of his right ankle.  He had been painting on the ladder when he fell off to the ground.  He felt a pop in the ankle and was found to have a distal fibula and medial malleolus fracture with dislocation.  He subsequently went to surgery and had this repaired by Dr. Hilda Lias. Medical consultation has been requested for management of his medical concerns.  The patient has a history of possible  medical problems including diabetes and heart disease.  PAST MEDICAL HISTORY: 1. Coronary artery disease, status post inferior MI treated with tPA     in 1992.  He subsequently had 4-vessel bypass surgery in 2003. 2. Type 2 diabetes. 3. Hypertension. 4. 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 5. Status post TURP in 2012. 6. History of left ankle fracture and surgery. 7. History of right spontaneous right pneumothorax in 2012. 8. Cholecystectomy. 9. Hypogonadism.  MEDICATIONS: 1. Metformin 1000 mg b.i.d. 2. Amaryl 2 mg daily. 3. Actos 45 mg daily. 4. Lisinopril 20 mg daily. 5. Metoprolol 50 mg b.i.d. 6. Aspirin 81 mg daily. 7. Lovastatin 20 mg daily. 8. Lopid 600 mg b.i.d.  ALLERGIES:  He is intolerant of LIPITOR which caused diarrhea.  FAMILY HISTORY:  His mother had a possible aneurysm and hypertension. His father had an MI.  He has had breast cancer, diabetes, and heart disease in his siblings.  SOCIAL HISTORY:  He does not smoke, drink, or use recreational substances.  REVIEW  OF SYSTEMS:  Noncontributory.  PHYSICAL EXAMINATION:  VITAL SIGNS:  Temperature 97.2, pulse 64, respirations 20, blood pressure 129/64. GENERAL:  Alert and in no distress. HEENT:  No scleral icterus.  Nose and oropharynx are unremarkable. NECK:  Supple with no JVD, thyromegaly, or carotid bruits. LUNGS:  Clear. HEART:  Regular with no murmurs.  He has a well-healed sternotomy scar. ABDOMEN:  Soft, nondistended, nontender with no hepatosplenomegaly. EXTREMITIES:  Has a splint in place on the right leg and foot.  The left pedal pulses normal with no edema. NEUROLOGIC:  Grossly intact. LYMPH NODES:  No cervical or supraclavicular enlargement.  LABORATORY DATA:  White count 6.2, hemoglobin 10.5, platelets 161,000. Sodium 136, potassium 4.9, bicarb 24, glucose 158, BUN 21, creatinine 1.89.  IMPRESSION AND PLAN: 1. Right distal fibula and medial malleolus fracture with dislocation     status post repair by Dr. Hilda Lias. 2. Type 2 diabetes, control has been stable on 3 oral agents.  His     hemoglobin A1c was 6.3 in February.  Accu-Cheks will be followed in     the hospital and sliding scale  NovoLog will be used as needed. 3. Chronic kidney disease, stable.  His last creatinine was 1.8 in     February.  Metformin could potentially need to be discontinued in     the near future.  His creatinine is being watched closely in this     regard. 4. Urinary tract obstruction due to benign prostatic hypertrophy.  He     is status post transurethral prostatectomy last year. 5. Hypertension.  Continue lisinopril and metoprolol. 6. Hyperlipidemia.  Continue lovastatin and gemfibrozil. 7. Coronary heart disease, stable on medical therapy.  He has also had     a cath since his bypass surgery in 2008, which revealed patent     grafts. 8. History of spontaneous pneumothorax.     Kingsley Callander. Ouida Sills, MD     ROF/MEDQ  D:  08/27/2011  T:  08/27/2011  Job:  [id number]

## 2011-08-31 NOTE — Progress Notes (Signed)
Discharge summary sent to payer through [facility]  

## 2011-09-01 ENCOUNTER — Encounter (HOSPITAL_COMMUNITY): Payer: Self-pay | Admitting: Orthopaedic Surgery

## 2014-08-14 DIAGNOSIS — E119 Type 2 diabetes mellitus without complications: Secondary | ICD-10-CM | POA: Diagnosis not present

## 2014-08-14 DIAGNOSIS — Z79899 Other long term (current) drug therapy: Secondary | ICD-10-CM | POA: Diagnosis not present

## 2014-08-14 DIAGNOSIS — N183 Chronic kidney disease, stage 3 (moderate): Secondary | ICD-10-CM | POA: Diagnosis not present

## 2014-08-14 DIAGNOSIS — I2510 Atherosclerotic heart disease of native coronary artery without angina pectoris: Secondary | ICD-10-CM | POA: Diagnosis not present

## 2014-08-14 DIAGNOSIS — N400 Enlarged prostate without lower urinary tract symptoms: Secondary | ICD-10-CM | POA: Diagnosis not present

## 2014-08-22 DIAGNOSIS — Z23 Encounter for immunization: Secondary | ICD-10-CM | POA: Diagnosis not present

## 2014-08-22 DIAGNOSIS — E785 Hyperlipidemia, unspecified: Secondary | ICD-10-CM | POA: Diagnosis not present

## 2014-08-22 DIAGNOSIS — E1129 Type 2 diabetes mellitus with other diabetic kidney complication: Secondary | ICD-10-CM | POA: Diagnosis not present

## 2014-08-22 DIAGNOSIS — I10 Essential (primary) hypertension: Secondary | ICD-10-CM | POA: Diagnosis not present

## 2014-08-22 DIAGNOSIS — N183 Chronic kidney disease, stage 3 (moderate): Secondary | ICD-10-CM | POA: Diagnosis not present

## 2014-12-20 DIAGNOSIS — I2510 Atherosclerotic heart disease of native coronary artery without angina pectoris: Secondary | ICD-10-CM | POA: Diagnosis not present

## 2014-12-20 DIAGNOSIS — E119 Type 2 diabetes mellitus without complications: Secondary | ICD-10-CM | POA: Diagnosis not present

## 2014-12-20 DIAGNOSIS — E785 Hyperlipidemia, unspecified: Secondary | ICD-10-CM | POA: Diagnosis not present

## 2015-01-01 DIAGNOSIS — Z6828 Body mass index (BMI) 28.0-28.9, adult: Secondary | ICD-10-CM | POA: Diagnosis not present

## 2015-01-01 DIAGNOSIS — K429 Umbilical hernia without obstruction or gangrene: Secondary | ICD-10-CM | POA: Diagnosis not present

## 2015-01-01 DIAGNOSIS — N183 Chronic kidney disease, stage 3 (moderate): Secondary | ICD-10-CM | POA: Diagnosis not present

## 2015-01-01 DIAGNOSIS — E1129 Type 2 diabetes mellitus with other diabetic kidney complication: Secondary | ICD-10-CM | POA: Diagnosis not present

## 2015-01-14 DIAGNOSIS — K432 Incisional hernia without obstruction or gangrene: Secondary | ICD-10-CM | POA: Diagnosis not present

## 2015-01-23 NOTE — Patient Instructions (Signed)
[name]  01/23/2015     '@PREFPERIOPPHARMACY'$ @   Your procedure is scheduled on  01/29/2015  Report to [facility] at  10 A.M.  Call this number if you have problems the morning of surgery:  [phone]   Remember:  Do not eat food or drink liquids after midnight.  Take these medicines the morning of surgery with A SIP OF WATER  Metoprolol.   Do not wear jewelry, make-up or nail polish.  Do not wear lotions, powders, or perfumes.  You may wear deodorant.  Do not shave 48 hours prior to surgery.  Men may shave face and neck.  Do not bring valuables to the hospital.  [facility] is not responsible for any belongings or valuables.  Contacts, dentures or bridgework may not be worn into surgery.  Leave your suitcase in the car.  After surgery it may be brought to your room.  For patients admitted to the hospital, discharge time will be determined by your treatment team.  Patients discharged the day of surgery will not be allowed to drive home.   Name and phone number of your driver:   family Special instructions:  none  Please read over the following fact sheets that you were given. Pain Booklet, Coughing and Deep Breathing, Surgical Site Infection Prevention, Anesthesia Post-op Instructions and Care and Recovery After Surgery      Hernia A hernia occurs when an internal organ pushes out through a weak spot in the abdominal wall. Hernias most commonly occur in the groin and around the navel. Hernias often can be pushed back into place (reduced). Most hernias tend to get worse over time. Some abdominal hernias can get stuck in the opening (irreducible or incarcerated hernia) and cannot be reduced. An irreducible abdominal hernia which is tightly squeezed into the opening is at risk for impaired blood supply (strangulated hernia). A strangulated hernia is a medical emergency. Because of the risk for an irreducible or strangulated hernia, surgery may be recommended to  repair a hernia. CAUSES   Heavy lifting.  Prolonged coughing.  Straining to have a bowel movement.  A cut (incision) made during an abdominal surgery. HOME CARE INSTRUCTIONS   Bed rest is not required. You may continue your normal activities.  Avoid lifting more than 10 pounds (4.5 kg) or straining.  Cough gently. If you are a smoker it is best to stop. Even the best hernia repair can break down with the continual strain of coughing. Even if you do not have your hernia repaired, a cough will continue to aggravate the problem.  Do not wear anything tight over your hernia. Do not try to keep it in with an outside bandage or truss. These can damage abdominal contents if they are trapped within the hernia sac.  Eat a normal diet.  Avoid constipation. Straining over long periods of time will increase hernia size and encourage breakdown of repairs. If you cannot do this with diet alone, stool softeners may be used. SEEK IMMEDIATE MEDICAL CARE IF:   You have a fever.  You develop increasing abdominal pain.  You feel nauseous or vomit.  Your hernia is stuck outside the abdomen, looks discolored, feels hard, or is tender.  You have any changes in your bowel habits or in the hernia that are unusual for you.  You have increased pain or swelling around the hernia.  You cannot push the hernia back in place by applying gentle pressure while  lying down. MAKE SURE YOU:   Understand these instructions.  Will watch your condition.  Will get help right away if you are not doing well or get worse. Document Released: 05/03/2005 Document Revised: 07/26/2011 Document Reviewed: 12/21/2007 [facility] Patient Information 2015 [place], [place]. This information is not intended to replace advice given to you by your health care provider. Make sure you discuss any questions you have with your health care provider. PATIENT INSTRUCTIONS POST-ANESTHESIA  IMMEDIATELY FOLLOWING SURGERY:  Do not drive or  operate machinery for the first twenty four hours after surgery.  Do not make any important decisions for twenty four hours after surgery or while taking narcotic pain medications or sedatives.  If you develop intractable nausea and vomiting or a severe headache please notify your doctor immediately.  FOLLOW-UP:  Please make an appointment with your surgeon as instructed. You do not need to follow up with anesthesia unless specifically instructed to do so.  WOUND CARE INSTRUCTIONS (if applicable):  Keep a dry clean dressing on the anesthesia/puncture wound site if there is drainage.  Once the wound has quit draining you may leave it open to air.  Generally you should leave the bandage intact for twenty four hours unless there is drainage.  If the epidural site drains for more than 36-48 hours please call the anesthesia department.  QUESTIONS?:  Please feel free to call your physician or the hospital operator if you have any questions, and they will be happy to assist you.

## 2015-01-23 NOTE — H&P (Signed)
  NTS SOAP Note  Vital Signs:  Vitals as of: 1/61/0960: Systolic 454: Diastolic 88: Heart Rate 66: Temp 97.12F: Height 53f 11in: Weight 194Lbs 0 Ounces: Pain Level 5: BMI 27.06  BMI : 27.06 kg/m2  Subjective: This 74year old male presents for of a hernia at the umbilicus.  Has been present for some time, but is increasing in size and causing him discomfort.  Made worse with straining.  Review of Symptoms:  Constitutional:unremarkable   Head:unremarkable Eyes:unremarkable   Nose/Mouth/Throat:unremarkable Cardiovascular:  unremarkable Respiratory:unremarkable Gastrointestinal:  unremarkable   Genitourinary:unremarkable   Musculoskeletal:unremarkable Skin:unremarkable Hematolgic/Lymphatic:unremarkable   Allergic/Immunologic:unremarkable   Past Medical History:  Reviewed  Past Medical History  Surgical History: lap cholecystectomy Medical Problems: NIDDM, HTN, BPH Allergies: nkda Medications: metformin, pioglitzaone, glipuride, metoprolol   Social History:Reviewed  Social History  Preferred Language: English Race:  White Ethnicity: Not Hispanic / Latino Age: [age]year Marital Status:  M Alcohol: no   Smoking Status: Never smoker reviewed on 01/14/2015 Functional Status reviewed on 01/14/2015 ------------------------------------------------ Bathing: Normal Cooking: Normal Dressing: Normal Driving: Normal Eating: Normal Managing Meds: Normal Oral Care: Normal Shopping: Normal Toileting: Normal Transferring: Normal Walking: Normal Cognitive Status reviewed on 01/14/2015 ------------------------------------------------ Attention: Normal Decision Making: Normal Language: Normal Memory: Normal Motor: Normal Perception: Normal Problem Solving: Normal Visual and Spatial: Normal   Family History:Reviewed  Family Health History Family History is Unknown    Objective Information: General:Well appearing, well nourished in no  distress. Heart:RRR, no murmur Lungs:  CTA bilaterally, no wheezes, rhonchi, rales.  Breathing unlabored. Abdomen:Soft, NT/ND, no HSM, no masses.  Periumbilical hernia below surgical scar.  Reducible.  Assessment:Incisional hernia  Diagnoses: 553.21  K43.2 Incisional hernia (Incisional hernia without obstruction or gangrene)  Procedures: 909811- OFFICE OUTPATIENT NEW 30 MINUTES    Plan:  Patient will call to schedule incisional herniorrhaphy with mesh.   Patient Education:Alternative treatments to surgery were discussed with patient (and family).  Risks and benefits  of procedure incluidng bleeding, infection, mesh use, and recurrence of the hernia were fully explained to the patient (and family) who gave informed consent. Patient/family questions were addressed.  Follow-up:Pending Surgery

## 2015-01-24 ENCOUNTER — Encounter (HOSPITAL_COMMUNITY): Payer: Self-pay

## 2015-01-24 ENCOUNTER — Encounter (HOSPITAL_COMMUNITY)
Admission: RE | Admit: 2015-01-24 | Discharge: 2015-01-24 | Disposition: A | Discharge status: Home / Self Care | Payer: Medicare Other | Source: Ambulatory Visit | Attending: General Surgery | Admitting: General Surgery

## 2015-01-24 ENCOUNTER — Other Ambulatory Visit: Payer: Self-pay

## 2015-01-24 DIAGNOSIS — Z01818 Encounter for other preprocedural examination: Secondary | ICD-10-CM | POA: Diagnosis not present

## 2015-01-24 DIAGNOSIS — K432 Incisional hernia without obstruction or gangrene: Secondary | ICD-10-CM | POA: Diagnosis not present

## 2015-01-24 HISTORY — DX: Atherosclerotic heart disease of native coronary artery without angina pectoris: I25.10

## 2015-01-24 HISTORY — DX: Chronic kidney disease, unspecified: N18.9

## 2015-01-24 LAB — CBC WITH DIFFERENTIAL/PLATELET
BASOS ABS: 0 10*3/uL (ref 0.0–0.1)
BASOS PCT: 0 % (ref 0–1)
EOS ABS: 0.2 10*3/uL (ref 0.0–0.7)
EOS PCT: 2 % (ref 0–5)
HCT: 39.9 % (ref 39.0–52.0)
HEMOGLOBIN: 13.8 g/dL (ref 13.0–17.0)
Lymphocytes Relative: 21 % (ref 12–46)
Lymphs Abs: 1.5 10*3/uL (ref 0.7–4.0)
MCH: 28.3 pg (ref 26.0–34.0)
MCHC: 34.6 g/dL (ref 30.0–36.0)
MCV: 81.9 fL (ref 78.0–100.0)
Monocytes Absolute: 0.6 10*3/uL (ref 0.1–1.0)
Monocytes Relative: 9 % (ref 3–12)
NEUTROS PCT: 68 % (ref 43–77)
Neutro Abs: 4.7 10*3/uL (ref 1.7–7.7)
PLATELETS: 192 10*3/uL (ref 150–400)
RBC: 4.87 MIL/uL (ref 4.22–5.81)
RDW: 13.6 % (ref 11.5–15.5)
WBC: 7 10*3/uL (ref 4.0–10.5)

## 2015-01-24 LAB — BASIC METABOLIC PANEL
Anion gap: 5 (ref 5–15)
BUN: 17 mg/dL (ref 6–20)
CALCIUM: 8.6 mg/dL — AB (ref 8.9–10.3)
CO2: 26 mmol/L (ref 22–32)
CREATININE: 1.68 mg/dL — AB (ref 0.61–1.24)
Chloride: 103 mmol/L (ref 101–111)
GFR, EST AFRICAN AMERICAN: 44 mL/min — AB (ref >60)
GFR, EST NON AFRICAN AMERICAN: 38 mL/min — AB (ref >60)
Glucose, Bld: 166 mg/dL — ABNORMAL HIGH (ref 65–99)
Potassium: 4.8 mmol/L (ref 3.5–5.1)
SODIUM: 134 mmol/L — AB (ref 135–145)

## 2015-01-29 ENCOUNTER — Ambulatory Visit (HOSPITAL_COMMUNITY): Payer: Medicare Other | Admitting: Anesthesiology

## 2015-01-29 ENCOUNTER — Encounter (HOSPITAL_COMMUNITY)
Admission: RE | Disposition: A | Discharge status: Home / Self Care | Payer: Self-pay | Source: Ambulatory Visit | Attending: General Surgery

## 2015-01-29 ENCOUNTER — Ambulatory Visit (HOSPITAL_COMMUNITY)
Admission: RE | Admit: 2015-01-29 | Discharge: 2015-01-29 | Disposition: A | Discharge status: Home / Self Care | Payer: Medicare Other | Source: Ambulatory Visit | Attending: General Surgery | Admitting: General Surgery

## 2015-01-29 ENCOUNTER — Encounter (HOSPITAL_COMMUNITY): Payer: Self-pay | Admitting: Anesthesiology

## 2015-01-29 DIAGNOSIS — K219 Gastro-esophageal reflux disease without esophagitis: Secondary | ICD-10-CM | POA: Diagnosis not present

## 2015-01-29 DIAGNOSIS — K432 Incisional hernia without obstruction or gangrene: Secondary | ICD-10-CM | POA: Diagnosis not present

## 2015-01-29 DIAGNOSIS — Z87891 Personal history of nicotine dependence: Secondary | ICD-10-CM | POA: Diagnosis not present

## 2015-01-29 DIAGNOSIS — E119 Type 2 diabetes mellitus without complications: Secondary | ICD-10-CM | POA: Insufficient documentation

## 2015-01-29 DIAGNOSIS — Z79899 Other long term (current) drug therapy: Secondary | ICD-10-CM | POA: Diagnosis not present

## 2015-01-29 DIAGNOSIS — I10 Essential (primary) hypertension: Secondary | ICD-10-CM | POA: Insufficient documentation

## 2015-01-29 HISTORY — PX: INSERTION OF MESH: SHX5868

## 2015-01-29 HISTORY — PX: INCISIONAL HERNIA REPAIR: SHX193

## 2015-01-29 LAB — GLUCOSE, CAPILLARY
GLUCOSE-CAPILLARY: 120 mg/dL — AB (ref 65–99)
GLUCOSE-CAPILLARY: 128 mg/dL — AB (ref 65–99)

## 2015-01-29 SURGERY — REPAIR, HERNIA, INCISIONAL
Anesthesia: General | Site: Abdomen

## 2015-01-29 MED ORDER — MIDAZOLAM HCL 2 MG/2ML IJ SOLN
INTRAMUSCULAR | Status: AC
  Filled 2015-01-29: qty 4

## 2015-01-29 MED ORDER — MIDAZOLAM HCL 2 MG/2ML IJ SOLN
INTRAMUSCULAR | Status: AC
  Filled 2015-01-29: qty 2

## 2015-01-29 MED ORDER — BUPIVACAINE LIPOSOME 1.3 % IJ SUSP
INTRAMUSCULAR | Status: AC
Start: 2015-01-29 — End: 2015-01-29
  Filled 2015-01-29: qty 20

## 2015-01-29 MED ORDER — FENTANYL CITRATE (PF) 100 MCG/2ML IJ SOLN
25.0000 ug | INTRAMUSCULAR | Status: AC
  Administered 2015-01-29 (×2): 25 ug via INTRAVENOUS

## 2015-01-29 MED ORDER — BUPIVACAINE HCL (PF) 0.5 % IJ SOLN
INTRAMUSCULAR | Status: DC | PRN
  Administered 2015-01-29: 7 mL

## 2015-01-29 MED ORDER — MIDAZOLAM HCL 5 MG/5ML IJ SOLN
INTRAMUSCULAR | Status: DC | PRN
  Administered 2015-01-29 (×2): 1 mg via INTRAVENOUS

## 2015-01-29 MED ORDER — CEFAZOLIN SODIUM-DEXTROSE 2-3 GM-% IV SOLR
2.0000 g | INTRAVENOUS | Status: AC
  Administered 2015-01-29: 2 g via INTRAVENOUS
  Filled 2015-01-29: qty 50

## 2015-01-29 MED ORDER — LIDOCAINE HCL (PF) 1 % IJ SOLN
INTRAMUSCULAR | Status: AC
  Filled 2015-01-29: qty 5

## 2015-01-29 MED ORDER — FENTANYL CITRATE (PF) 100 MCG/2ML IJ SOLN
INTRAMUSCULAR | Status: AC
  Filled 2015-01-29: qty 4

## 2015-01-29 MED ORDER — POVIDONE-IODINE 10 % OINT PACKET
TOPICAL_OINTMENT | CUTANEOUS | Status: DC | PRN
  Administered 2015-01-29: 1 via TOPICAL

## 2015-01-29 MED ORDER — EPHEDRINE SULFATE 50 MG/ML IJ SOLN
INTRAMUSCULAR | Status: DC | PRN
  Administered 2015-01-29: 10 mg via INTRAVENOUS

## 2015-01-29 MED ORDER — FENTANYL CITRATE (PF) 100 MCG/2ML IJ SOLN
INTRAMUSCULAR | Status: AC
  Filled 2015-01-29: qty 2

## 2015-01-29 MED ORDER — PROPOFOL 10 MG/ML IV BOLUS
INTRAVENOUS | Status: DC | PRN
  Administered 2015-01-29: 30 mg via INTRAVENOUS
  Administered 2015-01-29: 110 mg via INTRAVENOUS

## 2015-01-29 MED ORDER — MIDAZOLAM HCL 2 MG/2ML IJ SOLN: 1.0000 mg | INTRAMUSCULAR | Status: DC | PRN

## 2015-01-29 MED ORDER — BUPIVACAINE HCL (PF) 0.5 % IJ SOLN
INTRAMUSCULAR | Status: AC
Start: 2015-01-29 — End: 2015-01-29
  Filled 2015-01-29: qty 30

## 2015-01-29 MED ORDER — LIDOCAINE HCL 1 % IJ SOLN
INTRAMUSCULAR | Status: DC | PRN
  Administered 2015-01-29: 25 mg via INTRADERMAL

## 2015-01-29 MED ORDER — ONDANSETRON HCL 4 MG/2ML IJ SOLN
INTRAMUSCULAR | Status: AC
  Filled 2015-01-29: qty 2

## 2015-01-29 MED ORDER — LACTATED RINGERS IV SOLN
INTRAVENOUS | Status: DC
  Administered 2015-01-29: 09:00:00 via INTRAVENOUS

## 2015-01-29 MED ORDER — ONDANSETRON HCL 4 MG/2ML IJ SOLN: 4.0000 mg | Freq: Once | INTRAMUSCULAR | Status: DC | PRN

## 2015-01-29 MED ORDER — 0.9 % SODIUM CHLORIDE (POUR BTL) OPTIME
TOPICAL | Status: DC | PRN
Start: 2015-01-29 — End: 2015-01-29
  Administered 2015-01-29: 1000 mL

## 2015-01-29 MED ORDER — FENTANYL CITRATE (PF) 100 MCG/2ML IJ SOLN
INTRAMUSCULAR | Status: DC | PRN
  Administered 2015-01-29 (×2): 25 ug via INTRAVENOUS

## 2015-01-29 MED ORDER — FENTANYL CITRATE (PF) 100 MCG/2ML IJ SOLN: 25.0000 ug | INTRAMUSCULAR | Status: DC | PRN

## 2015-01-29 MED ORDER — GLYCOPYRROLATE 0.2 MG/ML IJ SOLN
0.2000 mg | Freq: Once | INTRAMUSCULAR | Status: AC
  Administered 2015-01-29: 0.2 mg via INTRAVENOUS

## 2015-01-29 MED ORDER — KETOROLAC TROMETHAMINE 30 MG/ML IJ SOLN
INTRAMUSCULAR | Status: AC
  Filled 2015-01-29: qty 1

## 2015-01-29 MED ORDER — HYDROCODONE-ACETAMINOPHEN 5-325 MG PO TABS: 1.0000 | ORAL_TABLET | Freq: Four times a day (QID) | ORAL | Status: AC | PRN

## 2015-01-29 MED ORDER — CHLORHEXIDINE GLUCONATE 4 % EX LIQD: 1.0000 "application " | Freq: Once | CUTANEOUS | Status: DC

## 2015-01-29 MED ORDER — POVIDONE-IODINE 10 % EX OINT
TOPICAL_OINTMENT | CUTANEOUS | Status: AC
  Filled 2015-01-29: qty 1

## 2015-01-29 MED ORDER — GLYCOPYRROLATE 0.2 MG/ML IJ SOLN
INTRAMUSCULAR | Status: AC
  Filled 2015-01-29: qty 1

## 2015-01-29 MED ORDER — ONDANSETRON HCL 4 MG/2ML IJ SOLN
4.0000 mg | Freq: Once | INTRAMUSCULAR | Status: AC
  Administered 2015-01-29: 4 mg via INTRAVENOUS

## 2015-01-29 MED ORDER — KETOROLAC TROMETHAMINE 30 MG/ML IJ SOLN: 30.0000 mg | Freq: Once | INTRAMUSCULAR | Status: DC

## 2015-01-29 SURGICAL SUPPLY — 50 items
BAG HAMPER (MISCELLANEOUS) ×4 IMPLANT
BLADE SURG SZ11 CARB STEEL (BLADE) ×4 IMPLANT
CHLORAPREP W/TINT 26ML (MISCELLANEOUS) ×4 IMPLANT
CLOTH BEACON ORANGE TIMEOUT ST (SAFETY) ×4 IMPLANT
COVER LIGHT HANDLE STERIS (MISCELLANEOUS) ×8 IMPLANT
DECANTER SPIKE VIAL GLASS SM (MISCELLANEOUS) IMPLANT
ELECT REM PT RETURN 9FT ADLT (ELECTROSURGICAL) ×4
ELECTRODE REM PT RTRN 9FT ADLT (ELECTROSURGICAL) ×2 IMPLANT
FORMALIN 10 PREFIL 480ML (MISCELLANEOUS) ×4 IMPLANT
GAUZE SPONGE 4X4 12PLY STRL (GAUZE/BANDAGES/DRESSINGS) ×4 IMPLANT
GLOVE BIO SURGEON STRL SZ 6.5 (GLOVE) ×2 IMPLANT
GLOVE BIO SURGEONS STRL SZ 6.5 (GLOVE) ×2
GLOVE BIOGEL PI IND STRL 7.0 (GLOVE) IMPLANT
GLOVE BIOGEL PI IND STRL 7.5 (GLOVE) IMPLANT
GLOVE BIOGEL PI INDICATOR 7.0 (GLOVE) ×4
GLOVE BIOGEL PI INDICATOR 7.5 (GLOVE) ×2
GLOVE SURG SS PI 7.5 STRL IVOR (GLOVE) ×4 IMPLANT
GOWN STRL REUS W/ TWL LRG LVL3 (GOWN DISPOSABLE) ×2 IMPLANT
GOWN STRL REUS W/ TWL XL LVL3 (GOWN DISPOSABLE) ×2 IMPLANT
GOWN STRL REUS W/TWL LRG LVL3 (GOWN DISPOSABLE) ×4
GOWN STRL REUS W/TWL XL LVL3 (GOWN DISPOSABLE) ×4
INST SET MAJOR GENERAL (KITS) ×4 IMPLANT
INST SET MINOR GENERAL (KITS) IMPLANT
KIT ROOM TURNOVER APOR (KITS) ×4 IMPLANT
MANIFOLD NEPTUNE II (INSTRUMENTS) ×4 IMPLANT
MESH VENTRALEX ST 1-7/10 CRC S (Mesh General) ×2 IMPLANT
NDL HYPO 21X1.5 SAFETY (NEEDLE) ×2 IMPLANT
NEEDLE HYPO 21X1.5 SAFETY (NEEDLE) ×4 IMPLANT
NS IRRIG 1000ML POUR BTL (IV SOLUTION) ×4 IMPLANT
PACK ABDOMINAL MAJOR (CUSTOM PROCEDURE TRAY) ×4 IMPLANT
PACK MINOR (CUSTOM PROCEDURE TRAY) IMPLANT
PAD ARMBOARD 7.5X6 YLW CONV (MISCELLANEOUS) ×4 IMPLANT
SET BASIN LINEN APH (SET/KITS/TRAYS/PACK) ×4 IMPLANT
SPONGE GAUZE 4X4 12PLY (GAUZE/BANDAGES/DRESSINGS) ×1 IMPLANT
STAPLER VISISTAT (STAPLE) IMPLANT
SUT ETHIBOND 0 MO6 C/R (SUTURE) ×4 IMPLANT
SUT NOVA NAB GS-21 1 T12 (SUTURE) IMPLANT
SUT NOVA NAB GS-22 2 2-0 T-19 (SUTURE) IMPLANT
SUT NOVA NAB GS-26 0 60 (SUTURE) IMPLANT
SUT PROLENE 0 CT 1 CR/8 (SUTURE) IMPLANT
SUT SILK 2 0 (SUTURE)
SUT SILK 2-0 18XBRD TIE 12 (SUTURE) IMPLANT
SUT VIC AB 2-0 CT1 27 (SUTURE)
SUT VIC AB 2-0 CT1 TAPERPNT 27 (SUTURE) ×2 IMPLANT
SUT VIC AB 3-0 SH 27 (SUTURE) ×4
SUT VIC AB 3-0 SH 27X BRD (SUTURE) ×2 IMPLANT
SUT VIC AB 4-0 PS2 27 (SUTURE) ×2 IMPLANT
SUT VICRYL AB 2 0 TIES (SUTURE) IMPLANT
SYR 20CC LL (SYRINGE) ×4 IMPLANT
TAPE CLOTH SURG 4X10 WHT LF (GAUZE/BANDAGES/DRESSINGS) ×2 IMPLANT

## 2015-01-29 NOTE — Discharge Instructions (Signed)
Open Hernia Repair, Care After °Refer to this sheet in the next few weeks. These instructions provide you with information on caring for yourself after your procedure. Your health care provider may also give you more specific instructions. Your treatment has been planned according to current medical practices, but problems sometimes occur. Call your health care provider if you have any problems or questions after your procedure. °WHAT TO EXPECT AFTER THE PROCEDURE °After your procedure, it is typical to have the following: °· Pain in your abdomen, especially along your incision. You will be given pain medicines to control the pain. °· Constipation. You may be given a stool softener to help prevent this. °HOME CARE INSTRUCTIONS  °· Only take over-the-counter or prescription medicines as directed by your health care provider. °· Keep the wound dry and clean. You may wash the wound gently with soap and water 48 hours after surgery. Gently blot or dab the wound dry. Do not take baths, use swimming pools, or use hot tubs for 10 days or until your health care provider approves. °· Change bandages (dressings) as directed by your health care provider. °· Continue your normal diet as directed by your health care provider. Eat plenty of fruits and vegetables to help prevent constipation. °· Drink enough fluids to keep your urine clear or pale yellow. This also helps prevent constipation. °· Do not drive until your health care provider says it is okay. °· Do not lift anything heavier than 10 pounds (4.5 kg) or play contact sports for 4 weeks or until your health care provider approves. °· Follow up with your health care provider as directed. Ask your health care provider when to make an appointment to have your stitches (sutures) or staples removed. °SEEK MEDICAL CARE IF:  °· You have increased bleeding coming from the incision site. °· You have blood in your stool. °· You have increasing pain in the wound. °· You see redness  or swelling in the wound. °· You have fluid (pus) coming from the wound. °· You have a fever. °· You notice a bad smell coming from the wound or dressing. °SEEK IMMEDIATE MEDICAL CARE IF:  °· You develop a rash. °· You have chest pain or shortness of breath. °· You feel lightheaded or feel faint. °Document Released: 11/20/2004 Document Revised: 02/21/2013 Document Reviewed: 12/13/2012 °ExitCare® Patient Information ©2015 [employer]. This information is not intended to replace advice given to you by your health care provider. Make sure you discuss any questions you have with your health care provider. ° °

## 2015-01-29 NOTE — Op Note (Signed)
Patient:  [name]  DOB:  [birth year]  MRN:  [id number]   Preop Diagnosis:  Incisional hernia  Postop Diagnosis:  Same  Procedure:  Incisional herniorrhaphy with mesh  Surgeon:  Aviva Signs, M.D.  Anes:  Gen.   Indications:  Patient is a 74 year old white male status post a laparoscopic procedure in the past who now presents with an incisional hernia at the umbilicus. The risks and benefits of the procedure including bleeding, infection, mesh use, and the possibility of recurrence of the hernia were fully explained to the patient, who gave informed consent.   Procedure note:  The patient was placed in the supine position. After general anesthesia was administered, the abdomen was prepped and draped using the usual sterile technique with DuraPrep. Surgical site confirmation was performed.  An infraumbilical incision was made down to the fascia. The umbilicus was freed away from the underlying fascia. The patient had a 1.5cm hernia defect. The hernia sac was excised and disposed of. The omentum was then reduced. A 4.3 cm Bard ventral last ST patch was then inserted and secured to the fascia using 0 Ethibond interrupted sutures. The overlying fascia was then reapproximated transversely using 0 Ethibond interrupted sutures. The umbilicus was secured back to the fascia using a 2-0 Vicryl interrupted suture. The subcutaneous layer was reapproximated using 3-0 Vicryl interrupted sutures. The skin was closed using staples. 0.5% Sensorcaine was instilled the surrounding wound. Betadine ointment dry sterile dressing were applied.  All tape and needle counts were correct at the end of the procedure. Patient was awakened and transferred to PACU in stable condition.  Complications:  None  EBL:  Minimal  Specimen:  None

## 2015-01-29 NOTE — Anesthesia Procedure Notes (Signed)
Procedure Name: LMA Insertion Date/Time: 01/29/2015 9:48 AM Performed by: Charmaine Downs Pre-anesthesia Checklist: Patient identified, Emergency Drugs available, Suction available and Patient being monitored Patient Re-evaluated:Patient Re-evaluated prior to inductionOxygen Delivery Method: Circle system utilized Preoxygenation: Pre-oxygenation with 100% oxygen Intubation Type: IV induction LMA: LMA inserted LMA Size: 4.0 Grade View: Grade II Tube type: Oral Number of attempts: 1 Placement Confirmation: positive ETCO2 and breath sounds checked- equal and bilateral Tube secured with: Tape Dental Injury: Teeth and Oropharynx as per pre-operative assessment

## 2015-01-29 NOTE — Interval H&P Note (Signed)
History and Physical Interval Note:  01/29/2015 9:08 AM  [name]  has presented today for surgery, with the diagnosis of incisional hernia  The various methods of treatment have been discussed with the patient and family. After consideration of risks, benefits and other options for treatment, the patient has consented to  Procedure(s): HERNIA REPAIR INCISIONAL WITH MESH (N/A) as a surgical intervention .  The patient's history has been reviewed, patient examined, no change in status, stable for surgery.  I have reviewed the patient's chart and labs.  Questions were answered to the patient's satisfaction.     Aviva Signs A

## 2015-01-29 NOTE — Transfer of Care (Signed)
Immediate Anesthesia Transfer of Care Note  Patient: [name]  Procedure(s) Performed: Procedure(s): INCISIONAL HERNIORRHAPHY WITH MESH (N/A) INSERTION OF MESH (N/A)  Patient Location: PACU  Anesthesia Type:General  Level of Consciousness: awake and patient cooperative  Airway & Oxygen Therapy: Patient Spontanous Breathing and Patient connected to face mask oxygen  Post-op Assessment: Report given to RN, Post -op Vital signs reviewed and stable and Patient moving all extremities  Post vital signs: Reviewed and stable  Last Vitals:  Filed Vitals:   01/29/15 0930  BP: 156/71  Pulse:   Temp:   Resp: 18    Complications: No apparent anesthesia complications

## 2015-01-29 NOTE — Anesthesia Preprocedure Evaluation (Signed)
Anesthesia Evaluation  Patient identified by MRN, date of birth, ID band Patient awake    Reviewed: Allergy & Precautions, H&P , NPO status , Patient's Chart, lab work & pertinent test results, reviewed documented beta blocker date and time   Airway Mallampati: II  TM Distance: >3 FB Neck ROM: Full    Dental  (+) Edentulous Upper, Edentulous Lower   Pulmonary former smoker,  History of collapsed lung following TURP in 2012   breath sounds clear to auscultation       Cardiovascular hypertension, Pt. on home beta blockers and Pt. on medications (-) angina+ CAD and + CABG   Rhythm:Regular Rate:Normal     Neuro/Psych negative psych ROS   GI/Hepatic negative GI ROS, Neg liver ROS, neg GERD  ,  Endo/Other  diabetes, Well Controlled, Type 2, Oral Hypoglycemic Agents   Renal/GU      Musculoskeletal   Abdominal   Peds  Hematology   Anesthesia Other Findings   Reproductive/Obstetrics                             Anesthesia Physical Anesthesia Plan  ASA: III  Anesthesia Plan: General   Post-op Pain Management:    Induction: Intravenous  Airway Management Planned: LMA  Additional Equipment:   Intra-op Plan:   Post-operative Plan:   Informed Consent: I have reviewed the patients History and Physical, chart, labs and discussed the procedure including the risks, benefits and alternatives for the proposed anesthesia with the patient or authorized representative who has indicated his/her understanding and acceptance.     Plan Discussed with:   Anesthesia Plan Comments:         Anesthesia Quick Evaluation

## 2015-01-29 NOTE — Anesthesia Postprocedure Evaluation (Signed)
  Anesthesia Post-op Note  Patient: [name]  Procedure(s) Performed: Procedure(s): INCISIONAL HERNIORRHAPHY WITH MESH (N/A) INSERTION OF MESH (N/A)  Patient Location: PACU  Anesthesia Type:General  Level of Consciousness: awake, alert , oriented and patient cooperative  Airway and Oxygen Therapy: Patient Spontanous Breathing  Post-op Pain: 2 /10, mild  Post-op Assessment: Post-op Vital signs reviewed, Patient's Cardiovascular Status Stable, Respiratory Function Stable, Patent Airway, No signs of Nausea or vomiting and Pain level controlled              Post-op Vital Signs: Reviewed and stable  Last Vitals:  Filed Vitals:   01/29/15 1037  BP: 143/74  Pulse: 70  Temp: 36.3 C  Resp: 13    Complications: No apparent anesthesia complications

## 2015-01-29 NOTE — Addendum Note (Signed)
Addendum  created 01/29/15 1047 by Charmaine Downs, CRNA   Modules edited: Anesthesia Medication Administration

## 2015-01-30 ENCOUNTER — Encounter (HOSPITAL_COMMUNITY): Payer: Self-pay | Admitting: General Surgery

## 2015-04-28 DIAGNOSIS — Z79899 Other long term (current) drug therapy: Secondary | ICD-10-CM | POA: Diagnosis not present

## 2015-04-28 DIAGNOSIS — E119 Type 2 diabetes mellitus without complications: Secondary | ICD-10-CM | POA: Diagnosis not present

## 2015-05-06 DIAGNOSIS — Z23 Encounter for immunization: Secondary | ICD-10-CM | POA: Diagnosis not present

## 2015-05-06 DIAGNOSIS — N183 Chronic kidney disease, stage 3 (moderate): Secondary | ICD-10-CM | POA: Diagnosis not present

## 2015-05-06 DIAGNOSIS — E119 Type 2 diabetes mellitus without complications: Secondary | ICD-10-CM | POA: Diagnosis not present

## 2015-05-06 DIAGNOSIS — I10 Essential (primary) hypertension: Secondary | ICD-10-CM | POA: Diagnosis not present

## 2015-06-09 ENCOUNTER — Telehealth: Payer: Self-pay | Admitting: Internal Medicine

## 2015-06-09 NOTE — Telephone Encounter (Signed)
Letter mailed for pt to call.  

## 2015-06-09 NOTE — Telephone Encounter (Signed)
RECALL FOR TCS °

## 2015-09-01 DIAGNOSIS — Z79899 Other long term (current) drug therapy: Secondary | ICD-10-CM | POA: Diagnosis not present

## 2015-09-01 DIAGNOSIS — I2510 Atherosclerotic heart disease of native coronary artery without angina pectoris: Secondary | ICD-10-CM | POA: Diagnosis not present

## 2015-09-01 DIAGNOSIS — E119 Type 2 diabetes mellitus without complications: Secondary | ICD-10-CM | POA: Diagnosis not present

## 2015-09-01 DIAGNOSIS — N183 Chronic kidney disease, stage 3 (moderate): Secondary | ICD-10-CM | POA: Diagnosis not present

## 2015-09-01 DIAGNOSIS — I10 Essential (primary) hypertension: Secondary | ICD-10-CM | POA: Diagnosis not present

## 2015-09-09 DIAGNOSIS — E1129 Type 2 diabetes mellitus with other diabetic kidney complication: Secondary | ICD-10-CM | POA: Diagnosis not present

## 2015-09-09 DIAGNOSIS — N183 Chronic kidney disease, stage 3 (moderate): Secondary | ICD-10-CM | POA: Diagnosis not present

## 2015-09-09 DIAGNOSIS — I10 Essential (primary) hypertension: Secondary | ICD-10-CM | POA: Diagnosis not present

## 2016-01-05 DIAGNOSIS — E119 Type 2 diabetes mellitus without complications: Secondary | ICD-10-CM | POA: Diagnosis not present

## 2016-01-05 DIAGNOSIS — Z79899 Other long term (current) drug therapy: Secondary | ICD-10-CM | POA: Diagnosis not present

## 2016-01-05 DIAGNOSIS — N183 Chronic kidney disease, stage 3 (moderate): Secondary | ICD-10-CM | POA: Diagnosis not present

## 2016-01-05 DIAGNOSIS — I10 Essential (primary) hypertension: Secondary | ICD-10-CM | POA: Diagnosis not present

## 2016-01-05 DIAGNOSIS — I2510 Atherosclerotic heart disease of native coronary artery without angina pectoris: Secondary | ICD-10-CM | POA: Diagnosis not present

## 2016-01-13 DIAGNOSIS — N183 Chronic kidney disease, stage 3 (moderate): Secondary | ICD-10-CM | POA: Diagnosis not present

## 2016-01-13 DIAGNOSIS — E1129 Type 2 diabetes mellitus with other diabetic kidney complication: Secondary | ICD-10-CM | POA: Diagnosis not present

## 2016-01-13 DIAGNOSIS — E785 Hyperlipidemia, unspecified: Secondary | ICD-10-CM | POA: Diagnosis not present

## 2016-03-02 DIAGNOSIS — Z23 Encounter for immunization: Secondary | ICD-10-CM | POA: Diagnosis not present

## 2016-05-14 DIAGNOSIS — I2510 Atherosclerotic heart disease of native coronary artery without angina pectoris: Secondary | ICD-10-CM | POA: Diagnosis not present

## 2016-05-14 DIAGNOSIS — Z79899 Other long term (current) drug therapy: Secondary | ICD-10-CM | POA: Diagnosis not present

## 2016-05-14 DIAGNOSIS — E119 Type 2 diabetes mellitus without complications: Secondary | ICD-10-CM | POA: Diagnosis not present

## 2016-05-14 DIAGNOSIS — N183 Chronic kidney disease, stage 3 (moderate): Secondary | ICD-10-CM | POA: Diagnosis not present

## 2016-05-21 DIAGNOSIS — N183 Chronic kidney disease, stage 3 (moderate): Secondary | ICD-10-CM | POA: Diagnosis not present

## 2016-05-21 DIAGNOSIS — E1122 Type 2 diabetes mellitus with diabetic chronic kidney disease: Secondary | ICD-10-CM | POA: Diagnosis not present

## 2016-06-08 ENCOUNTER — Emergency Department (HOSPITAL_COMMUNITY)
Admission: EM | Admit: 2016-06-08 | Discharge: 2016-06-08 | Disposition: A | Discharge status: Home / Self Care | Payer: Medicare Other | Attending: Emergency Medicine | Admitting: Emergency Medicine

## 2016-06-08 ENCOUNTER — Emergency Department (HOSPITAL_COMMUNITY): Payer: Medicare Other

## 2016-06-08 ENCOUNTER — Encounter (HOSPITAL_COMMUNITY): Payer: Self-pay

## 2016-06-08 DIAGNOSIS — W231XXA Caught, crushed, jammed, or pinched between stationary objects, initial encounter: Secondary | ICD-10-CM | POA: Insufficient documentation

## 2016-06-08 DIAGNOSIS — Y929 Unspecified place or not applicable: Secondary | ICD-10-CM | POA: Insufficient documentation

## 2016-06-08 DIAGNOSIS — N189 Chronic kidney disease, unspecified: Secondary | ICD-10-CM | POA: Diagnosis not present

## 2016-06-08 DIAGNOSIS — I2510 Atherosclerotic heart disease of native coronary artery without angina pectoris: Secondary | ICD-10-CM | POA: Diagnosis not present

## 2016-06-08 DIAGNOSIS — Y999 Unspecified external cause status: Secondary | ICD-10-CM | POA: Insufficient documentation

## 2016-06-08 DIAGNOSIS — E1122 Type 2 diabetes mellitus with diabetic chronic kidney disease: Secondary | ICD-10-CM | POA: Insufficient documentation

## 2016-06-08 DIAGNOSIS — Z87891 Personal history of nicotine dependence: Secondary | ICD-10-CM | POA: Insufficient documentation

## 2016-06-08 DIAGNOSIS — Z23 Encounter for immunization: Secondary | ICD-10-CM | POA: Insufficient documentation

## 2016-06-08 DIAGNOSIS — Z7984 Long term (current) use of oral hypoglycemic drugs: Secondary | ICD-10-CM | POA: Diagnosis not present

## 2016-06-08 DIAGNOSIS — I129 Hypertensive chronic kidney disease with stage 1 through stage 4 chronic kidney disease, or unspecified chronic kidney disease: Secondary | ICD-10-CM | POA: Insufficient documentation

## 2016-06-08 DIAGNOSIS — S61214A Laceration without foreign body of right ring finger without damage to nail, initial encounter: Secondary | ICD-10-CM | POA: Diagnosis not present

## 2016-06-08 DIAGNOSIS — Y9389 Activity, other specified: Secondary | ICD-10-CM | POA: Insufficient documentation

## 2016-06-08 DIAGNOSIS — Z7982 Long term (current) use of aspirin: Secondary | ICD-10-CM | POA: Insufficient documentation

## 2016-06-08 MED ORDER — LIDOCAINE-EPINEPHRINE-TETRACAINE (LET) SOLUTION: 3.0000 mL | Freq: Once | NASAL | Status: DC

## 2016-06-08 MED ORDER — POVIDONE-IODINE 10 % EX SOLN
CUTANEOUS | Status: AC
  Filled 2016-06-08: qty 118

## 2016-06-08 MED ORDER — TETANUS-DIPHTH-ACELL PERTUSSIS 5-2.5-18.5 LF-MCG/0.5 IM SUSP
0.5000 mL | Freq: Once | INTRAMUSCULAR | Status: AC
  Administered 2016-06-08: 0.5 mL via INTRAMUSCULAR
  Filled 2016-06-08: qty 0.5

## 2016-06-08 MED ORDER — DOUBLE ANTIBIOTIC 500-10000 UNIT/GM EX OINT
TOPICAL_OINTMENT | Freq: Once | CUTANEOUS | Status: AC
  Administered 2016-06-08: 13:00:00 via TOPICAL
  Filled 2016-06-08: qty 1

## 2016-06-08 MED ORDER — LIDOCAINE HCL (PF) 2 % IJ SOLN
10.0000 mL | Freq: Once | INTRAMUSCULAR | Status: DC
  Filled 2016-06-08: qty 10
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
## 2016-06-08 NOTE — Discharge Instructions (Signed)
Wear the finger splint until the stitches are removed in 7 days. You may put antibiotics ointment on the lacerations up to 2 times per day
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
## 2016-06-18 DIAGNOSIS — Z4802 Encounter for removal of sutures: Secondary | ICD-10-CM | POA: Diagnosis not present

## 2016-09-14 DIAGNOSIS — E785 Hyperlipidemia, unspecified: Secondary | ICD-10-CM | POA: Diagnosis not present

## 2016-09-14 DIAGNOSIS — E119 Type 2 diabetes mellitus without complications: Secondary | ICD-10-CM | POA: Diagnosis not present

## 2016-09-14 DIAGNOSIS — I10 Essential (primary) hypertension: Secondary | ICD-10-CM | POA: Diagnosis not present

## 2016-09-14 DIAGNOSIS — N183 Chronic kidney disease, stage 3 (moderate): Secondary | ICD-10-CM | POA: Diagnosis not present

## 2016-09-14 DIAGNOSIS — Z79899 Other long term (current) drug therapy: Secondary | ICD-10-CM | POA: Diagnosis not present

## 2016-09-20 DIAGNOSIS — E1122 Type 2 diabetes mellitus with diabetic chronic kidney disease: Secondary | ICD-10-CM | POA: Diagnosis not present

## 2016-09-20 DIAGNOSIS — N183 Chronic kidney disease, stage 3 (moderate): Secondary | ICD-10-CM | POA: Diagnosis not present

## 2016-09-20 DIAGNOSIS — E785 Hyperlipidemia, unspecified: Secondary | ICD-10-CM | POA: Diagnosis not present

## 2016-09-20 DIAGNOSIS — I2510 Atherosclerotic heart disease of native coronary artery without angina pectoris: Secondary | ICD-10-CM | POA: Diagnosis not present

## 2017-01-19 DIAGNOSIS — E119 Type 2 diabetes mellitus without complications: Secondary | ICD-10-CM | POA: Diagnosis not present

## 2017-01-19 DIAGNOSIS — E785 Hyperlipidemia, unspecified: Secondary | ICD-10-CM | POA: Diagnosis not present

## 2017-01-19 DIAGNOSIS — I10 Essential (primary) hypertension: Secondary | ICD-10-CM | POA: Diagnosis not present

## 2017-01-19 DIAGNOSIS — I2510 Atherosclerotic heart disease of native coronary artery without angina pectoris: Secondary | ICD-10-CM | POA: Diagnosis not present

## 2017-01-19 DIAGNOSIS — Z79899 Other long term (current) drug therapy: Secondary | ICD-10-CM | POA: Diagnosis not present

## 2017-01-26 DIAGNOSIS — I2510 Atherosclerotic heart disease of native coronary artery without angina pectoris: Secondary | ICD-10-CM | POA: Diagnosis not present

## 2017-01-26 DIAGNOSIS — N183 Chronic kidney disease, stage 3 (moderate): Secondary | ICD-10-CM | POA: Diagnosis not present

## 2017-01-26 DIAGNOSIS — E1129 Type 2 diabetes mellitus with other diabetic kidney complication: Secondary | ICD-10-CM | POA: Diagnosis not present

## 2017-03-01 DIAGNOSIS — Z23 Encounter for immunization: Secondary | ICD-10-CM | POA: Diagnosis not present

## 2017-05-25 DIAGNOSIS — E1129 Type 2 diabetes mellitus with other diabetic kidney complication: Secondary | ICD-10-CM | POA: Diagnosis not present

## 2017-05-25 DIAGNOSIS — N183 Chronic kidney disease, stage 3 (moderate): Secondary | ICD-10-CM | POA: Diagnosis not present

## 2017-05-25 DIAGNOSIS — Z79899 Other long term (current) drug therapy: Secondary | ICD-10-CM | POA: Diagnosis not present

## 2017-05-25 DIAGNOSIS — I2510 Atherosclerotic heart disease of native coronary artery without angina pectoris: Secondary | ICD-10-CM | POA: Diagnosis not present

## 2017-06-01 DIAGNOSIS — E1122 Type 2 diabetes mellitus with diabetic chronic kidney disease: Secondary | ICD-10-CM | POA: Diagnosis not present

## 2017-06-01 DIAGNOSIS — I25110 Atherosclerotic heart disease of native coronary artery with unstable angina pectoris: Secondary | ICD-10-CM | POA: Diagnosis not present

## 2017-06-01 DIAGNOSIS — N183 Chronic kidney disease, stage 3 (moderate): Secondary | ICD-10-CM | POA: Diagnosis not present

## 2017-06-17 ENCOUNTER — Encounter: Payer: Self-pay | Admitting: *Deleted

## 2017-06-17 ENCOUNTER — Telehealth: Payer: Self-pay | Admitting: *Deleted

## 2017-06-17 ENCOUNTER — Ambulatory Visit (HOSPITAL_COMMUNITY)
Admission: RE | Admit: 2017-06-17 | Discharge: 2017-06-17 | Disposition: A | Discharge status: Home / Self Care | Payer: Medicare Other | Source: Ambulatory Visit | Attending: Cardiovascular Disease | Admitting: Cardiovascular Disease

## 2017-06-17 ENCOUNTER — Encounter: Payer: Self-pay | Admitting: Cardiovascular Disease

## 2017-06-17 ENCOUNTER — Telehealth: Payer: Self-pay | Admitting: Cardiovascular Disease

## 2017-06-17 ENCOUNTER — Other Ambulatory Visit: Payer: Self-pay | Admitting: Cardiovascular Disease

## 2017-06-17 ENCOUNTER — Other Ambulatory Visit (HOSPITAL_COMMUNITY)
Admission: RE | Admit: 2017-06-17 | Discharge: 2017-06-17 | Disposition: A | Discharge status: Home / Self Care | Payer: Medicare Other | Source: Ambulatory Visit | Attending: Cardiovascular Disease | Admitting: Cardiovascular Disease

## 2017-06-17 ENCOUNTER — Ambulatory Visit: Payer: Medicare Other | Admitting: Cardiovascular Disease

## 2017-06-17 ENCOUNTER — Other Ambulatory Visit: Payer: Self-pay

## 2017-06-17 VITALS — BP 140/80 | HR 60 | Ht 71.0 in | Wt 199.0 lb

## 2017-06-17 DIAGNOSIS — E119 Type 2 diabetes mellitus without complications: Secondary | ICD-10-CM | POA: Insufficient documentation

## 2017-06-17 DIAGNOSIS — R0602 Shortness of breath: Secondary | ICD-10-CM | POA: Diagnosis not present

## 2017-06-17 DIAGNOSIS — Z01818 Encounter for other preprocedural examination: Secondary | ICD-10-CM | POA: Diagnosis not present

## 2017-06-17 DIAGNOSIS — I10 Essential (primary) hypertension: Secondary | ICD-10-CM

## 2017-06-17 DIAGNOSIS — R079 Chest pain, unspecified: Secondary | ICD-10-CM | POA: Diagnosis not present

## 2017-06-17 DIAGNOSIS — R0609 Other forms of dyspnea: Secondary | ICD-10-CM

## 2017-06-17 DIAGNOSIS — N183 Chronic kidney disease, stage 3 unspecified: Secondary | ICD-10-CM

## 2017-06-17 DIAGNOSIS — R918 Other nonspecific abnormal finding of lung field: Secondary | ICD-10-CM | POA: Insufficient documentation

## 2017-06-17 DIAGNOSIS — I2510 Atherosclerotic heart disease of native coronary artery without angina pectoris: Secondary | ICD-10-CM | POA: Diagnosis not present

## 2017-06-17 DIAGNOSIS — I25708 Atherosclerosis of coronary artery bypass graft(s), unspecified, with other forms of angina pectoris: Secondary | ICD-10-CM

## 2017-06-17 DIAGNOSIS — Z951 Presence of aortocoronary bypass graft: Secondary | ICD-10-CM | POA: Diagnosis not present

## 2017-06-17 DIAGNOSIS — I208 Other forms of angina pectoris: Secondary | ICD-10-CM

## 2017-06-17 DIAGNOSIS — I209 Angina pectoris, unspecified: Secondary | ICD-10-CM | POA: Insufficient documentation

## 2017-06-17 DIAGNOSIS — Z01812 Encounter for preprocedural laboratory examination: Secondary | ICD-10-CM

## 2017-06-17 LAB — BASIC METABOLIC PANEL
Anion gap: 9 (ref 5–15)
BUN: 24 mg/dL — ABNORMAL HIGH (ref 6–20)
CHLORIDE: 97 mmol/L — AB (ref 101–111)
CO2: 27 mmol/L (ref 22–32)
CREATININE: 1.61 mg/dL — AB (ref 0.61–1.24)
Calcium: 9.4 mg/dL (ref 8.9–10.3)
GFR calc Af Amer: 46 mL/min — ABNORMAL LOW (ref >60)
GFR calc non Af Amer: 40 mL/min — ABNORMAL LOW (ref >60)
GLUCOSE: 179 mg/dL — AB (ref 65–99)
POTASSIUM: 4.8 mmol/L (ref 3.5–5.1)
SODIUM: 133 mmol/L — AB (ref 135–145)

## 2017-06-17 LAB — CBC
HCT: 44.8 % (ref 39.0–52.0)
Hemoglobin: 14.9 g/dL (ref 13.0–17.0)
MCH: 27.4 pg (ref 26.0–34.0)
MCHC: 33.3 g/dL (ref 30.0–36.0)
MCV: 82.4 fL (ref 78.0–100.0)
Platelets: 206 10*3/uL (ref 150–400)
RBC: 5.44 MIL/uL (ref 4.22–5.81)
RDW: 13.8 % (ref 11.5–15.5)
WBC: 9.8 10*3/uL (ref 4.0–10.5)

## 2017-06-17 LAB — APTT: aPTT: 33 seconds (ref 24–36)

## 2017-06-17 LAB — PROTIME-INR
INR: 1.01
PROTHROMBIN TIME: 13.2 s (ref 11.4–15.2)

## 2017-06-17 MED ORDER — ATORVASTATIN CALCIUM 40 MG PO TABS: 40.0000 mg | ORAL_TABLET | Freq: Every day | ORAL | 6 refills | Status: DC

## 2017-06-17 NOTE — Patient Instructions (Addendum)
Medication Instructions:  Begin Lipitor 40mg  daily. Continue all other medications.    Labwork: BMET, CBC, PT, PTT - orders given today.  Testing/Procedures:  A chest x-ray takes a picture of the organs and structures inside the chest, including the heart, lungs, and blood vessels. This test can show several things, including, whether the heart is enlarges; whether fluid is building up in the lungs; and whether pacemaker / defibrillator leads are still in place.  Your physician has requested that you have an echocardiogram. Echocardiography is a painless test that uses sound waves to create images of your heart. It provides your doctor with information about the size and shape of your heart and how well your heart's chambers and valves are working. This procedure takes approximately one hour. There are no restrictions for this procedure.  Your physician has requested that you have a cardiac catheterization. Cardiac catheterization is used to diagnose and/or treat various heart conditions. Doctors may recommend this procedure for a number of different reasons. The most common reason is to evaluate chest pain. Chest pain can be a symptom of coronary artery disease (CAD), and cardiac catheterization can show whether plaque is narrowing or blocking your heart's arteries. This procedure is also used to evaluate the valves, as well as measure the blood flow and oxygen levels in different parts of your heart. For further information please visit [facility]. Please follow instruction sheet, as given.  Follow-Up: 1 month - [place] office.   Any Other Special Instructions Will Be Listed Below (If Applicable).  If you need a refill on your cardiac medications before your next appointment, please call your pharmacy.

## 2017-06-17 NOTE — Telephone Encounter (Signed)
I attempted to contact patient to review pre-cath instructions, received message at phone number listed cannot place calls due to network difficulty.  Catheterization at [facility] scheduled for: 06/20/2017 10:30 AM  Arrival time and place: [facility] 8 AM. Nothing to eat or drink after midnight.   HOLD medications: Glimepiride am of procedure Metformin 06/19/17, 06/20/17 and 48 hours after procedure. Pioglitazone am of procedure  Except hold medications can take AM meds including: ASA 81 mg  Responsible person to drive pt home and observe for 24 hours.

## 2017-06-17 NOTE — Progress Notes (Signed)
CARDIOLOGY CONSULT NOTE  Patient ID: [name] MRN: [id number] DOB/AGE: 1940/07/12 77 y.o.  Admit date: (Not on file) Primary Physician: [name], MD Referring Physician: Dr. Willey Blade  Reason for Consultation: Chest pain history of CABG  HPI: [name] is a 77 y.o. male who is being seen today for the evaluation of chest pain in the context of a history of coronary artery disease and prior CABG at the request of [name], MD.   I reviewed records from his PCP.  He has a history of type 2 diabetes mellitus.  He has been tried on ACE inhibitors in the past but it led to hyperkalemia.  He has chronic kidney disease stage III as well and hypertension.  I reviewed labs from his PCP on 05/25/17: BUN 16, creatinine 1.64, sodium 142, potassium 5, hemoglobin A1c 7.8%.  ECG performed today which I personally reviewed demonstrated sinus bradycardia, 58 bpm, with sinus arrhythmia.  There were mild nonspecific inferior ST segment abnormalities.  He underwent four-vessel CABG on 09/22/2001 with a LIMA to the LAD, saphenous vein graft to the diagonal, saphenous vein graft to the second obtuse marginal, and saphenous vein graft to the distal RCA.  His last coronary angiogram was on 06/08/2006 which demonstrated patent bypass grafts.  A coronary angiogram prior to that was performed on 11/04/2003 which also demonstrated patent grafts.  For the past 1-2 months, he has been experiencing progressive precordial pain with exertion.  He has also had progressive exertional dyspnea to the point where he has to stop after walking 25 yards.  He does not smoke.  He was awoken with precordial pain about 1 month ago which she described as "sharp and stabbing like a knife ".  He has had progressive dizziness over the past 1 month as well.  He denies palpitations and syncope.  He denies orthopnea, paroxysmal nocturnal dyspnea, and leg edema.  He is currently on aspirin and metoprolol tartrate 100 mg twice  daily.  Coronary angiogram details 06/08/2006:   1. The left main has perhaps 20% narrowing.  2. The left anterior descending artery has a 70-80% stenosis with      competitive filling distally.  The first diagonal was totally      occluded.  There is a small first diagonal.  3. The saphenous vein graft to the diagonal appears to be relatively      smooth and intact.  4. The internal mammary of the distal left anterior descending is      clearly intact.  There is excellent flow to the distal vessel.      Distally there is about a 40% area of narrowing followed by about a      70% area of eccentric focal narrowing prior to the distal LAD      bifurcation.  5. The circumflex has subtotal occlusion proximally with evidence of      competitive filling.  6. The saphenous vein graft to the OM-2 is intact.  It fills      retrograde.  There is disease noted in the retrograde filling.      There is flow down the distal vessel.  7. The right coronary artery demonstrates multiple 90% stenoses and is      totally occluded.  This has progressed from the previous study.  8. The saphenous vein graft to the distal right coronary artery is      intact and fills the PDA and posterolateral branch  without      significant focal abnormality.   CONCLUSIONS:  1. Continued patency of the saphenous vein grafts to the diagonal, OM-      2, and distal right coronary artery.  2. Patency of the internal mammary to the left anterior descending      artery with diffuse distal disease distal to the graft insertion.  3. Mild elevation in left ventricular end-diastolic pressure.   No Known Allergies  Current Outpatient Medications  Medication Sig Dispense Refill  . aspirin EC 81 MG tablet Take 81 mg by mouth daily.    Marland Kitchen glimepiride (AMARYL) 1 MG tablet Take 1 mg by mouth daily with breakfast.    . metFORMIN (GLUCOPHAGE) 500 MG tablet Take 500 mg by mouth 2 (two) times daily with a meal.    . metoprolol  (LOPRESSOR) 100 MG tablet Take 100 mg by mouth 2 (two) times daily.    . pioglitazone (ACTOS) 45 MG tablet Take 45 mg by mouth daily.     No current facility-administered medications for this visit.     Past Medical History:  Diagnosis Date  . Chronic kidney disease   . Collapsed lung   . Coronary artery disease   . Diabetes mellitus   . Hypertension     Past Surgical History:  Procedure Laterality Date  . ANKLE FRACTURE SURGERY  2008   [facility]  . APPENDECTOMY  1960's  . CHOLECYSTECTOMY  2008  . CORONARY ARTERY BYPASS GRAFT  2003  . EYE SURGERY  2001   Cataracts removed bilaterally  . INCISIONAL HERNIA REPAIR N/A 01/29/2015   Procedure: Fatima Blank HERNIORRHAPHY WITH MESH;  Surgeon: Aviva Signs, MD;  Location: [facility];  Service: General;  Laterality: N/A;  . INSERTION OF MESH N/A 01/29/2015   Procedure: INSERTION OF MESH;  Surgeon: Aviva Signs, MD;  Location: [facility];  Service: General;  Laterality: N/A;  . [place]  . ORIF ANKLE FRACTURE  08/26/2011   Procedure: OPEN REDUCTION INTERNAL FIXATION (ORIF) ANKLE FRACTURE;  Surgeon: Sanjuana Kava, MD;  Location: [facility];  Service: Orthopedics;  Laterality: Right;  . PROSTATE SURGERY  2012   Enlarged prostate  . TRANSURETHRAL RESECTION OF PROSTATE  2012    Social History   Socioeconomic History  . Marital status: Married    Spouse name: Not on file  . Number of children: Not on file  . Years of education: Not on file  . Highest education level: Not on file  Social Needs  . Financial resource strain: Not on file  . Food insecurity - worry: Not on file  . Food insecurity - inability: Not on file  . Transportation needs - medical: Not on file  . Transportation needs - non-medical: Not on file  Occupational History  . Not on file  Tobacco Use  . Smoking status: Former Smoker    Packs/day: 1.00    Years: 33.00    Pack years: 33.00    Last attempt to quit:  05/17/1987    Years since quitting: 30.1  . Smokeless tobacco: Never Used  Substance and Sexual Activity  . Alcohol use: No  . Drug use: No  . Sexual activity: Yes  Other Topics Concern  . Not on file  Social History Narrative  . Not on file     No family history of premature CAD in 1st degree relatives.  Current Meds  Medication Sig  . aspirin EC 81 MG  tablet Take 81 mg by mouth daily.  Marland Kitchen glimepiride (AMARYL) 1 MG tablet Take 1 mg by mouth daily with breakfast.  . metFORMIN (GLUCOPHAGE) 500 MG tablet Take 500 mg by mouth 2 (two) times daily with a meal.  . metoprolol (LOPRESSOR) 100 MG tablet Take 100 mg by mouth 2 (two) times daily.  . pioglitazone (ACTOS) 45 MG tablet Take 45 mg by mouth daily.      Review of systems complete and found to be negative unless listed above in HPI    Physical exam Blood pressure 140/80, pulse 60, height 5\' 11"  (1.803 m), weight 199 lb (90.3 kg), SpO2 93 %. General: NAD Neck: No JVD, no thyromegaly or thyroid nodule.  Lungs: Clear to auscultation bilaterally with normal respiratory effort. CV: Nondisplaced PMI. Bradycardic, regular rhythm, normal S1/S2, no S3/S4, no murmur.  No peripheral edema.  No carotid bruit.    Abdomen: Soft, nontender, no distention.  Skin: Intact without lesions or rashes.  Neurologic: Alert and oriented x 3.  Psych: Normal affect. Extremities: No clubbing or cyanosis.  HEENT: Normal.   ECG: Most recent ECG reviewed.   Labs: Lab Results  Component Value Date/Time   K 4.8 01/24/2015 03:00 PM   BUN 17 01/24/2015 03:00 PM   CREATININE 1.68 (H) 01/24/2015 03:00 PM   ALT 10 09/09/2010 10:08 AM   HGB 13.8 01/24/2015 03:00 PM     Lipids: No results found for: LDLCALC, LDLDIRECT, CHOL, TRIG, HDL      ASSESSMENT AND PLAN:  1.  Progressive exertional dyspnea and chest pain with exertion consistent with angina with prior history of four-vessel CABG: Symptoms are concerning for exertional angina.  I will obtain  left heart catheterization with coronary and graft angiography.  Creatinine is elevated at 1.64 and will thus need IV fluids before hand to attenuate risk of contrast mediated nephropathy.  I will avoid left ventriculography and obtain an echocardiogram to evaluate cardiac structure and function.  He is currently on aspirin and metoprolol tartrate 100 mg twice daily.  I will start Lipitor 40 mg.  2.  Hypertension: Blood pressure is mildly elevated.  I will monitor.  He developed hyperkalemia with ACE inhibitors in the past.  Creatinine is elevated at 1.64.  3.  Chronic kidney disease stage III: Creatinine is elevated at 1.64.  He will need IV fluids prior to coronary angiography to attenuate risk of contrast mediated nephropathy.     Disposition: Follow up in 1 month in [place]   Signed: Kate Sable, M.D., F.A.C.C.  06/17/2017, 8:58 AM

## 2017-06-17 NOTE — Telephone Encounter (Signed)
Pre-cert Verification for the following procedure   Left heart cath - Monday, 2/4 w/ [place] at 10:30

## 2017-06-17 NOTE — Telephone Encounter (Signed)
Attempted to contact pt again, received message about network difficulties.

## 2017-06-17 NOTE — H&P (View-Only) (Signed)
CARDIOLOGY CONSULT NOTE  Patient ID: [name] MRN: [id number] DOB/AGE: [birth year] 78 y.o.  Admit date: (Not on file) Primary Physician: [name], MD Referring Physician: Dr. Willey Blade  Reason for Consultation: Chest pain history of CABG  HPI: [name] is a 77 y.o. male who is being seen today for the evaluation of chest pain in the context of a history of coronary artery disease and prior CABG at the request of [name], MD.   I reviewed records from his PCP.  He has a history of type 2 diabetes mellitus.  He has been tried on ACE inhibitors in the past but it led to hyperkalemia.  He has chronic kidney disease stage III as well and hypertension.  I reviewed labs from his PCP on 05/25/17: BUN 16, creatinine 1.64, sodium 142, potassium 5, hemoglobin A1c 7.8%.  ECG performed today which I personally reviewed demonstrated sinus bradycardia, 58 bpm, with sinus arrhythmia.  There were mild nonspecific inferior ST segment abnormalities.  He underwent four-vessel CABG on 09/22/2001 with a LIMA to the LAD, saphenous vein graft to the diagonal, saphenous vein graft to the second obtuse marginal, and saphenous vein graft to the distal RCA.  His last coronary angiogram was on 06/08/2006 which demonstrated patent bypass grafts.  A coronary angiogram prior to that was performed on 11/04/2003 which also demonstrated patent grafts.  For the past 1-2 months, he has been experiencing progressive precordial pain with exertion.  He has also had progressive exertional dyspnea to the point where he has to stop after walking 25 yards.  He does not smoke.  He was awoken with precordial pain about 1 month ago which she described as "sharp and stabbing like a knife ".  He has had progressive dizziness over the past 1 month as well.  He denies palpitations and syncope.  He denies orthopnea, paroxysmal nocturnal dyspnea, and leg edema.  He is currently on aspirin and metoprolol tartrate 100 mg twice  daily.  Coronary angiogram details 06/08/2006:   1. The left main has perhaps 20% narrowing.  2. The left anterior descending artery has a 70-80% stenosis with      competitive filling distally.  The first diagonal was totally      occluded.  There is a small first diagonal.  3. The saphenous vein graft to the diagonal appears to be relatively      smooth and intact.  4. The internal mammary of the distal left anterior descending is      clearly intact.  There is excellent flow to the distal vessel.      Distally there is about a 40% area of narrowing followed by about a      70% area of eccentric focal narrowing prior to the distal LAD      bifurcation.  5. The circumflex has subtotal occlusion proximally with evidence of      competitive filling.  6. The saphenous vein graft to the OM-2 is intact.  It fills      retrograde.  There is disease noted in the retrograde filling.      There is flow down the distal vessel.  7. The right coronary artery demonstrates multiple 90% stenoses and is      totally occluded.  This has progressed from the previous study.  8. The saphenous vein graft to the distal right coronary artery is      intact and fills the PDA and posterolateral branch  without      significant focal abnormality.   CONCLUSIONS:  1. Continued patency of the saphenous vein grafts to the diagonal, OM-      2, and distal right coronary artery.  2. Patency of the internal mammary to the left anterior descending      artery with diffuse distal disease distal to the graft insertion.  3. Mild elevation in left ventricular end-diastolic pressure.   No Known Allergies  Current Outpatient Medications  Medication Sig Dispense Refill  . aspirin EC 81 MG tablet Take 81 mg by mouth daily.    Marland Kitchen glimepiride (AMARYL) 1 MG tablet Take 1 mg by mouth daily with breakfast.    . metFORMIN (GLUCOPHAGE) 500 MG tablet Take 500 mg by mouth 2 (two) times daily with a meal.    . metoprolol  (LOPRESSOR) 100 MG tablet Take 100 mg by mouth 2 (two) times daily.    . pioglitazone (ACTOS) 45 MG tablet Take 45 mg by mouth daily.     No current facility-administered medications for this visit.     Past Medical History:  Diagnosis Date  . Chronic kidney disease   . Collapsed lung   . Coronary artery disease   . Diabetes mellitus   . Hypertension     Past Surgical History:  Procedure Laterality Date  . ANKLE FRACTURE SURGERY  2008   [facility]  . APPENDECTOMY  1960's  . CHOLECYSTECTOMY  2008  . CORONARY ARTERY BYPASS GRAFT  2003  . EYE SURGERY  2001   Cataracts removed bilaterally  . INCISIONAL HERNIA REPAIR N/A 01/29/2015   Procedure: Fatima Blank HERNIORRHAPHY WITH MESH;  Surgeon: Aviva Signs, MD;  Location: [facility];  Service: General;  Laterality: N/A;  . INSERTION OF MESH N/A 01/29/2015   Procedure: INSERTION OF MESH;  Surgeon: Aviva Signs, MD;  Location: [facility];  Service: General;  Laterality: N/A;  . [place]  . ORIF ANKLE FRACTURE  08/26/2011   Procedure: OPEN REDUCTION INTERNAL FIXATION (ORIF) ANKLE FRACTURE;  Surgeon: Sanjuana Kava, MD;  Location: [facility];  Service: Orthopedics;  Laterality: Right;  . PROSTATE SURGERY  2012   Enlarged prostate  . TRANSURETHRAL RESECTION OF PROSTATE  2012    Social History   Socioeconomic History  . Marital status: Married    Spouse name: Not on file  . Number of children: Not on file  . Years of education: Not on file  . Highest education level: Not on file  Social Needs  . Financial resource strain: Not on file  . Food insecurity - worry: Not on file  . Food insecurity - inability: Not on file  . Transportation needs - medical: Not on file  . Transportation needs - non-medical: Not on file  Occupational History  . Not on file  Tobacco Use  . Smoking status: Former Smoker    Packs/day: 1.00    Years: 33.00    Pack years: 33.00    Last attempt to quit:  05/17/1987    Years since quitting: 30.1  . Smokeless tobacco: Never Used  Substance and Sexual Activity  . Alcohol use: No  . Drug use: No  . Sexual activity: Yes  Other Topics Concern  . Not on file  Social History Narrative  . Not on file     No family history of premature CAD in 1st degree relatives.  Current Meds  Medication Sig  . aspirin EC 81 MG  tablet Take 81 mg by mouth daily.  Marland Kitchen glimepiride (AMARYL) 1 MG tablet Take 1 mg by mouth daily with breakfast.  . metFORMIN (GLUCOPHAGE) 500 MG tablet Take 500 mg by mouth 2 (two) times daily with a meal.  . metoprolol (LOPRESSOR) 100 MG tablet Take 100 mg by mouth 2 (two) times daily.  . pioglitazone (ACTOS) 45 MG tablet Take 45 mg by mouth daily.      Review of systems complete and found to be negative unless listed above in HPI    Physical exam Blood pressure 140/80, pulse 60, height 5\' 11"  (1.803 m), weight 199 lb (90.3 kg), SpO2 93 %. General: NAD Neck: No JVD, no thyromegaly or thyroid nodule.  Lungs: Clear to auscultation bilaterally with normal respiratory effort. CV: Nondisplaced PMI. Bradycardic, regular rhythm, normal S1/S2, no S3/S4, no murmur.  No peripheral edema.  No carotid bruit.    Abdomen: Soft, nontender, no distention.  Skin: Intact without lesions or rashes.  Neurologic: Alert and oriented x 3.  Psych: Normal affect. Extremities: No clubbing or cyanosis.  HEENT: Normal.   ECG: Most recent ECG reviewed.   Labs: Lab Results  Component Value Date/Time   K 4.8 01/24/2015 03:00 PM   BUN 17 01/24/2015 03:00 PM   CREATININE 1.68 (H) 01/24/2015 03:00 PM   ALT 10 09/09/2010 10:08 AM   HGB 13.8 01/24/2015 03:00 PM     Lipids: No results found for: LDLCALC, LDLDIRECT, CHOL, TRIG, HDL      ASSESSMENT AND PLAN:  1.  Progressive exertional dyspnea and chest pain with exertion consistent with angina with prior history of four-vessel CABG: Symptoms are concerning for exertional angina.  I will obtain  left heart catheterization with coronary and graft angiography.  Creatinine is elevated at 1.64 and will thus need IV fluids before hand to attenuate risk of contrast mediated nephropathy.  I will avoid left ventriculography and obtain an echocardiogram to evaluate cardiac structure and function.  He is currently on aspirin and metoprolol tartrate 100 mg twice daily.  I will start Lipitor 40 mg.  2.  Hypertension: Blood pressure is mildly elevated.  I will monitor.  He developed hyperkalemia with ACE inhibitors in the past.  Creatinine is elevated at 1.64.  3.  Chronic kidney disease stage III: Creatinine is elevated at 1.64.  He will need IV fluids prior to coronary angiography to attenuate risk of contrast mediated nephropathy.     Disposition: Follow up in 1 month in [place]   Signed: Kate Sable, M.D., F.A.C.C.  06/17/2017, 8:58 AM

## 2017-06-20 ENCOUNTER — Encounter (HOSPITAL_COMMUNITY)
Admission: RE | Disposition: A | Discharge status: Home / Self Care | Payer: Self-pay | Source: Ambulatory Visit | Attending: Cardiology

## 2017-06-20 ENCOUNTER — Ambulatory Visit (HOSPITAL_COMMUNITY)
Admission: RE | Admit: 2017-06-20 | Discharge: 2017-06-20 | Disposition: A | Discharge status: Home / Self Care | Payer: Medicare Other | Source: Ambulatory Visit | Attending: Cardiology | Admitting: Cardiology

## 2017-06-20 DIAGNOSIS — N183 Chronic kidney disease, stage 3 unspecified: Secondary | ICD-10-CM | POA: Diagnosis present

## 2017-06-20 DIAGNOSIS — I129 Hypertensive chronic kidney disease with stage 1 through stage 4 chronic kidney disease, or unspecified chronic kidney disease: Secondary | ICD-10-CM | POA: Insufficient documentation

## 2017-06-20 DIAGNOSIS — Z87891 Personal history of nicotine dependence: Secondary | ICD-10-CM | POA: Insufficient documentation

## 2017-06-20 DIAGNOSIS — E1122 Type 2 diabetes mellitus with diabetic chronic kidney disease: Secondary | ICD-10-CM | POA: Diagnosis not present

## 2017-06-20 DIAGNOSIS — I208 Other forms of angina pectoris: Secondary | ICD-10-CM

## 2017-06-20 DIAGNOSIS — I25719 Atherosclerosis of autologous vein coronary artery bypass graft(s) with unspecified angina pectoris: Secondary | ICD-10-CM | POA: Diagnosis not present

## 2017-06-20 DIAGNOSIS — I209 Angina pectoris, unspecified: Secondary | ICD-10-CM | POA: Diagnosis present

## 2017-06-20 DIAGNOSIS — Z7984 Long term (current) use of oral hypoglycemic drugs: Secondary | ICD-10-CM | POA: Diagnosis not present

## 2017-06-20 DIAGNOSIS — I2510 Atherosclerotic heart disease of native coronary artery without angina pectoris: Secondary | ICD-10-CM | POA: Diagnosis present

## 2017-06-20 DIAGNOSIS — I2582 Chronic total occlusion of coronary artery: Secondary | ICD-10-CM | POA: Diagnosis not present

## 2017-06-20 DIAGNOSIS — Z7982 Long term (current) use of aspirin: Secondary | ICD-10-CM | POA: Diagnosis not present

## 2017-06-20 DIAGNOSIS — I25119 Atherosclerotic heart disease of native coronary artery with unspecified angina pectoris: Secondary | ICD-10-CM | POA: Diagnosis not present

## 2017-06-20 DIAGNOSIS — E119 Type 2 diabetes mellitus without complications: Secondary | ICD-10-CM

## 2017-06-20 HISTORY — PX: LEFT HEART CATH AND CORS/GRAFTS ANGIOGRAPHY: CATH118250

## 2017-06-20 LAB — GLUCOSE, CAPILLARY: Glucose-Capillary: 196 mg/dL — ABNORMAL HIGH (ref 65–99)

## 2017-06-20 SURGERY — LEFT HEART CATH AND CORS/GRAFTS ANGIOGRAPHY
Anesthesia: LOCAL

## 2017-06-20 MED ORDER — SODIUM CHLORIDE 0.9% FLUSH: 3.0000 mL | Freq: Two times a day (BID) | INTRAVENOUS | Status: DC

## 2017-06-20 MED ORDER — HEPARIN SODIUM (PORCINE) 1000 UNIT/ML IJ SOLN
INTRAMUSCULAR | Status: DC | PRN
  Administered 2017-06-20: 4500 [IU] via INTRAVENOUS

## 2017-06-20 MED ORDER — FENTANYL CITRATE (PF) 100 MCG/2ML IJ SOLN
INTRAMUSCULAR | Status: AC
  Filled 2017-06-20: qty 2

## 2017-06-20 MED ORDER — ASPIRIN 81 MG PO CHEW
CHEWABLE_TABLET | ORAL | Status: AC
  Filled 2017-06-20: qty 1

## 2017-06-20 MED ORDER — HEPARIN (PORCINE) IN NACL 2-0.9 UNIT/ML-% IJ SOLN
INTRAMUSCULAR | Status: AC
  Filled 2017-06-20: qty 1000

## 2017-06-20 MED ORDER — SODIUM CHLORIDE 0.9 % IV SOLN: 250.0000 mL | INTRAVENOUS | Status: DC | PRN

## 2017-06-20 MED ORDER — LIDOCAINE HCL (PF) 1 % IJ SOLN
INTRAMUSCULAR | Status: DC | PRN
  Administered 2017-06-20: 2 mL

## 2017-06-20 MED ORDER — HYDRALAZINE HCL 20 MG/ML IJ SOLN
INTRAMUSCULAR | Status: AC
  Filled 2017-06-20: qty 1

## 2017-06-20 MED ORDER — LIDOCAINE HCL 1 % IJ SOLN
INTRAMUSCULAR | Status: AC
  Filled 2017-06-20: qty 20

## 2017-06-20 MED ORDER — HEPARIN SODIUM (PORCINE) 1000 UNIT/ML IJ SOLN
INTRAMUSCULAR | Status: AC
  Filled 2017-06-20: qty 1

## 2017-06-20 MED ORDER — HEPARIN (PORCINE) IN NACL 2-0.9 UNIT/ML-% IJ SOLN
INTRAMUSCULAR | Status: DC | PRN
  Administered 2017-06-20: 11:00:00

## 2017-06-20 MED ORDER — ASPIRIN 81 MG PO CHEW
81.0000 mg | CHEWABLE_TABLET | ORAL | Status: AC
  Administered 2017-06-20: 81 mg via ORAL

## 2017-06-20 MED ORDER — MIDAZOLAM HCL 2 MG/2ML IJ SOLN
INTRAMUSCULAR | Status: AC
  Filled 2017-06-20: qty 2

## 2017-06-20 MED ORDER — VERAPAMIL HCL 2.5 MG/ML IV SOLN
INTRAVENOUS | Status: AC
  Filled 2017-06-20: qty 2

## 2017-06-20 MED ORDER — IOPAMIDOL (ISOVUE-370) INJECTION 76%
INTRAVENOUS | Status: DC | PRN
  Administered 2017-06-20: 95 mL

## 2017-06-20 MED ORDER — SODIUM CHLORIDE 0.9 % WEIGHT BASED INFUSION: 1.0000 mL/kg/h | INTRAVENOUS | Status: AC

## 2017-06-20 MED ORDER — VERAPAMIL HCL 2.5 MG/ML IV SOLN
INTRAVENOUS | Status: DC | PRN
  Administered 2017-06-20: 10 mL via INTRA_ARTERIAL

## 2017-06-20 MED ORDER — SODIUM CHLORIDE 0.9% FLUSH: 3.0000 mL | INTRAVENOUS | Status: DC | PRN

## 2017-06-20 MED ORDER — SODIUM CHLORIDE 0.9 % IV SOLN
INTRAVENOUS | Status: DC
  Administered 2017-06-20: 08:00:00 via INTRAVENOUS

## 2017-06-20 MED ORDER — METFORMIN HCL 500 MG PO TABS: 500.0000 mg | ORAL_TABLET | Freq: Two times a day (BID) | ORAL | Status: DC

## 2017-06-20 MED ORDER — HYDRALAZINE HCL 20 MG/ML IJ SOLN
10.0000 mg | Freq: Once | INTRAMUSCULAR | Status: AC
  Administered 2017-06-20: 10 mg via INTRAVENOUS

## 2017-06-20 MED ORDER — MIDAZOLAM HCL 2 MG/2ML IJ SOLN
INTRAMUSCULAR | Status: DC | PRN
  Administered 2017-06-20: 1 mg via INTRAVENOUS

## 2017-06-20 MED ORDER — FENTANYL CITRATE (PF) 100 MCG/2ML IJ SOLN
INTRAMUSCULAR | Status: DC | PRN
  Administered 2017-06-20: 25 ug via INTRAVENOUS

## 2017-06-20 MED ORDER — IOPAMIDOL (ISOVUE-370) INJECTION 76%
INTRAVENOUS | Status: AC
  Filled 2017-06-20: qty 100

## 2017-06-20 SURGICAL SUPPLY — 11 items
CATH INFINITI 5 FR LCB (CATHETERS) ×1 IMPLANT
CATH INFINITI 5 FR MPA2 (CATHETERS) ×1 IMPLANT
CATH INFINITI 5FR AL1 (CATHETERS) ×1 IMPLANT
CATH INFINITI MULTIPACK ST 5F (CATHETERS) ×1 IMPLANT
GLIDESHEATH SLEND SS 6F .021 (SHEATH) ×1 IMPLANT
GUIDEWIRE INQWIRE 1.5J.035X260 (WIRE) IMPLANT
INQWIRE 1.5J .035X260CM (WIRE) ×2
KIT HEART LEFT (KITS) ×2 IMPLANT
PACK CARDIAC CATHETERIZATION (CUSTOM PROCEDURE TRAY) ×2 IMPLANT
TRANSDUCER W/STOPCOCK (MISCELLANEOUS) ×2 IMPLANT
TUBING CIL FLEX 10 FLL-RA (TUBING) ×2 IMPLANT

## 2017-06-20 NOTE — Discharge Instructions (Signed)

## 2017-06-20 NOTE — Interval H&P Note (Signed)
History and Physical Interval Note:  06/20/2017 10:53 AM  [name]  has presented today for surgery, with the diagnosis of chest pain  The various methods of treatment have been discussed with the patient and family. After consideration of risks, benefits and other options for treatment, the patient has consented to  Procedure(s): LEFT HEART CATH AND CORS/GRAFTS ANGIOGRAPHY (N/A) as a surgical intervention .  The patient's history has been reviewed, patient examined, no change in status, stable for surgery.  I have reviewed the patient's chart and labs.  Questions were answered to the patient's satisfaction.   Cath Lab Visit (complete for each Cath Lab visit)  Clinical Evaluation Leading to the Procedure:   ACS: No.  Non-ACS:    Anginal Classification: CCS III  Anti-ischemic medical therapy: Minimal Therapy (1 class of medications)  Non-Invasive Test Results: No non-invasive testing performed  Prior CABG: Previous CABG        [name] 06/20/2017 10:53 AM

## 2017-06-21 ENCOUNTER — Encounter (HOSPITAL_COMMUNITY): Payer: Self-pay | Admitting: Cardiology

## 2017-06-22 ENCOUNTER — Telehealth: Payer: Self-pay | Admitting: *Deleted

## 2017-06-22 DIAGNOSIS — R9389 Abnormal findings on diagnostic imaging of other specified body structures: Secondary | ICD-10-CM

## 2017-06-22 NOTE — Telephone Encounter (Signed)
LABS -  Notes recorded by Herminio Commons, MD on 06/17/2017 at 11:19 AM EST CKD stage III is stable.    CHEST X-RAY -  Notes recorded by Herminio Commons, MD on 06/17/2017 at 1:04 PM EST Concerning for metastatic disease vs infectious. Please send a copy to his PCP. Also make a pulmonary referral.

## 2017-06-22 NOTE — Telephone Encounter (Signed)
Notes recorded by Laurine Blazer, LPN on 0/05/1001 at 4:96 PM EST Patient notified. Copy to pmd. Follow up scheduled for 07/19/2017 with Dr. Bronson Ing in the [place] office. Patient agrees to see pulmonology & prefers to stay local. Will refer to Dr. Luan Pulling in [place].   ------  Notes recorded by Laurine Blazer, LPN on 05/17/6433 at 3:91 PM EST Attempted to notify patient - phone states unable to reach due to technical difficulties.

## 2017-06-29 ENCOUNTER — Encounter: Payer: Self-pay | Admitting: Thoracic Surgery (Cardiothoracic Vascular Surgery)

## 2017-06-29 ENCOUNTER — Ambulatory Visit (INDEPENDENT_AMBULATORY_CARE_PROVIDER_SITE_OTHER): Payer: Medicare Other

## 2017-06-29 ENCOUNTER — Other Ambulatory Visit: Payer: Self-pay

## 2017-06-29 DIAGNOSIS — I209 Angina pectoris, unspecified: Secondary | ICD-10-CM

## 2017-07-05 ENCOUNTER — Telehealth: Payer: Self-pay | Admitting: *Deleted

## 2017-07-05 NOTE — Telephone Encounter (Signed)
Notes recorded by Laurine Blazer, LPN on 05/26/2109 at 73:56 AM EST Patient notified. Copy to pmd. Follow up scheduled for 07/19/2017 with Dr. Bronson Ing.   ------  Notes recorded by Herminio Commons, MD on 06/29/2017 at 2:18 PM EST Normal pumping function.

## 2017-07-06 ENCOUNTER — Other Ambulatory Visit (HOSPITAL_COMMUNITY): Payer: Self-pay | Admitting: Pulmonary Disease

## 2017-07-06 DIAGNOSIS — R918 Other nonspecific abnormal finding of lung field: Secondary | ICD-10-CM | POA: Diagnosis not present

## 2017-07-06 DIAGNOSIS — I10 Essential (primary) hypertension: Secondary | ICD-10-CM | POA: Diagnosis not present

## 2017-07-06 DIAGNOSIS — J984 Other disorders of lung: Secondary | ICD-10-CM

## 2017-07-06 DIAGNOSIS — N183 Chronic kidney disease, stage 3 (moderate): Secondary | ICD-10-CM | POA: Diagnosis not present

## 2017-07-06 DIAGNOSIS — E119 Type 2 diabetes mellitus without complications: Secondary | ICD-10-CM | POA: Diagnosis not present

## 2017-07-07 ENCOUNTER — Ambulatory Visit: Payer: Medicare Other | Admitting: Thoracic Surgery (Cardiothoracic Vascular Surgery)

## 2017-07-12 ENCOUNTER — Encounter (HOSPITAL_COMMUNITY): Payer: Medicare Other

## 2017-07-15 ENCOUNTER — Encounter (HOSPITAL_COMMUNITY): Payer: Medicare Other

## 2017-07-19 ENCOUNTER — Encounter: Payer: Self-pay | Admitting: Cardiovascular Disease

## 2017-07-19 ENCOUNTER — Ambulatory Visit: Payer: Medicare Other | Admitting: Cardiovascular Disease

## 2017-07-19 VITALS — BP 140/80 | HR 63 | Ht 70.0 in | Wt 199.0 lb

## 2017-07-19 DIAGNOSIS — N183 Chronic kidney disease, stage 3 unspecified: Secondary | ICD-10-CM

## 2017-07-19 DIAGNOSIS — I10 Essential (primary) hypertension: Secondary | ICD-10-CM

## 2017-07-19 DIAGNOSIS — I25708 Atherosclerosis of coronary artery bypass graft(s), unspecified, with other forms of angina pectoris: Secondary | ICD-10-CM | POA: Diagnosis not present

## 2017-07-19 DIAGNOSIS — R0609 Other forms of dyspnea: Secondary | ICD-10-CM | POA: Diagnosis not present

## 2017-07-19 DIAGNOSIS — R918 Other nonspecific abnormal finding of lung field: Secondary | ICD-10-CM | POA: Diagnosis not present

## 2017-07-19 DIAGNOSIS — I209 Angina pectoris, unspecified: Secondary | ICD-10-CM | POA: Diagnosis not present

## 2017-07-19 MED ORDER — ISOSORBIDE MONONITRATE ER 30 MG PO TB24: 30.0000 mg | ORAL_TABLET | Freq: Every day | ORAL | 3 refills | Status: DC

## 2017-07-19 NOTE — Patient Instructions (Signed)
Your physician recommends that you schedule a follow-up appointment in:  3 months with Dr Bronson Ing     START Imdur 30 mg daily    All other medications stay the same.    If you need a refill on your cardiac medications before your next appointment, please call your pharmacy.    No lab work or tests ordered today.       Thank you for choosing [facility] !

## 2017-07-19 NOTE — Progress Notes (Signed)
SUBJECTIVE: The patient presents for follow-up after undergoing coronary angiography.  He was found to have severe native three-vessel obstructive coronary disease, patent LIMA to the LAD, and severe SVG disease with a 99% proximal SVG stenosis to the diagonal with TIMI I flow and multiple high-grade lesions in the SVG to the OM and SVG to the RCA.  Dr. Martinique recommended consideration for redo CABG. He was evaluated by Dr. Roxy Manns for this.  Echocardiogram on 06/29/17 demonstrated normal left ventricular systolic function and regional wall motion, LVEF 85-27%, grade 1 diastolic dysfunction, and mild mitral and tricuspid regurgitation.  Chest x-ray on 06/17/17 showed multiple bilateral pulmonary nodules worrisome for metastatic disease.  An infectious etiology could not entirely be excluded.  For these reasons I referred him to pulmonary and he has been scheduled for a PET scan.  The patient tells me it was canceled as his insurance did not approve it.  He is now scheduled to undergo this at [facility] on 08/01/17 with a follow-up appointment with Dr. Roxy Manns on 08/08/17.  He continues to take sublingual nitroglycerin on a fairly frequent basis due to exertional angina.  He also has exertional dyspnea which is stable since his last visit.  He has no known primary cancer.     Review of Systems: As per "subjective", otherwise negative.  No Known Allergies  Current Outpatient Medications  Medication Sig Dispense Refill  . aspirin EC 81 MG tablet Take 81 mg by mouth daily.    Marland Kitchen atorvastatin (LIPITOR) 40 MG tablet Take 1 tablet (40 mg total) by mouth daily. 30 tablet 6  . glimepiride (AMARYL) 1 MG tablet Take 1 mg by mouth daily with breakfast.    . metFORMIN (GLUCOPHAGE) 500 MG tablet Take 1 tablet (500 mg total) by mouth 2 (two) times daily with a meal.    . metoprolol (LOPRESSOR) 100 MG tablet Take 100 mg by mouth 2 (two) times daily.    . pioglitazone (ACTOS) 45 MG tablet Take 45 mg  by mouth daily.     No current facility-administered medications for this visit.     Past Medical History:  Diagnosis Date  . Chronic kidney disease   . Collapsed lung   . Coronary artery disease   . Diabetes mellitus   . Hypertension   . 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 ANKLE FRACTURE SURGERY  2008   [facility]  . APPENDECTOMY  1960's  . CHOLECYSTECTOMY  2008  . CORONARY ARTERY BYPASS GRAFT  2003  . EYE SURGERY  2001   Cataracts removed bilaterally  . INCISIONAL HERNIA REPAIR N/A 01/29/2015   Procedure: Fatima Blank HERNIORRHAPHY WITH MESH;  Surgeon: Aviva Signs, MD;  Location: [facility];  Service: General;  Laterality: N/A;  . INSERTION OF MESH N/A 01/29/2015   Procedure: INSERTION OF MESH;  Surgeon: Aviva Signs, MD;  Location: [facility];  Service: General;  Laterality: N/A;  . [place]  . LEFT HEART CATH AND CORS/GRAFTS ANGIOGRAPHY N/A 06/20/2017   Procedure: LEFT HEART CATH AND CORS/GRAFTS ANGIOGRAPHY;  Surgeon: Martinique, Peter M, MD;  Location: [place] [facility];  Service: Cardiovascular;  Laterality: N/A;  . ORIF ANKLE FRACTURE  08/26/2011   Procedure: OPEN REDUCTION INTERNAL FIXATION (ORIF)  ANKLE FRACTURE;  Surgeon: Sanjuana Kava, MD;  Location: [facility];  Service: Orthopedics;  Laterality: Right;  . PROSTATE SURGERY  2012   Enlarged prostate  . TRANSURETHRAL RESECTION OF PROSTATE  2012    Social History   Socioeconomic History  . Marital status: Married    Spouse name: Not on file  . Number of children: Not on file  . Years of education: Not on file  . Highest education level: Not on file  Social Needs  . Financial resource strain: Not on file  . Food insecurity - worry: Not on file  . Food insecurity - inability: Not on file  . Transportation needs - medical: Not on file    . Transportation needs - non-medical: Not on file  Occupational History  . Not on file  Tobacco Use  . Smoking status: Former Smoker    Packs/day: 1.00    Years: 33.00    Pack years: 33.00    Last attempt to quit: 05/17/1987    Years since quitting: 30.1  . Smokeless tobacco: Never Used  Substance and Sexual Activity  . Alcohol use: No  . Drug use: No  . Sexual activity: Yes  Other Topics Concern  . Not on file  Social History Narrative  . Not on file     Vitals:   07/19/17 1131  BP: 140/80  Pulse: 63  SpO2: 97%  Weight: 199 lb (90.3 kg)  Height: 5\' 10"  (1.778 m)    Wt Readings from Last 3 Encounters:  07/19/17 199 lb (90.3 kg)  06/20/17 195 lb (88.5 kg)  06/17/17 199 lb (90.3 kg)     PHYSICAL EXAM General: NAD HEENT: Normal. Neck: No JVD, no thyromegaly. Lungs: Diffuse inspiratory and expiratory wheezes bilaterally, no crackles. CV: Regular rate and rhythm, normal S1/S2, no S3/S4, no murmur. No pretibial or periankle edema.   Abdomen: Soft, nontender, no distention.  Neurologic: Alert and oriented.  Psych: Normal affect. Skin: Normal. Musculoskeletal: No gross deformities.    ECG: Most recent ECG reviewed.   Labs: Lab Results  Component Value Date/Time   K 4.8 06/17/2017 09:51 AM   BUN 24 (H) 06/17/2017 09:51 AM   CREATININE 1.61 (H) 06/17/2017 09:51 AM   ALT 10 09/09/2010 10:08 AM   HGB 14.9 06/17/2017 09:51 AM     Lipids: No results found for: LDLCALC, LDLDIRECT, CHOL, TRIG, HDL     ASSESSMENT AND PLAN: 1.  Coronary artery disease with history of four-vessel CABG and severe SVG disease with concomitant angina pectoris: Continue aspirin, Lipitor, and metoprolol.  I will start Imdur 30 mg.  Dr. Martinique recommended redo CABG.  His candidacy for this will be dependent upon pulmonary status with regards to potential metastatic disease.  He is following with Dr. Luan Pulling and is scheduled for a PET scan.  He will see Dr. Roxy Manns on 08/08/17..  2.   Hypertension: Blood pressure is mildly elevated.  He developed hyperkalemia with ACE inhibitors in the past.  I will monitor given addition of Imdur 30 mg as noted above.  3.  Chronic kidney disease stage III: BUN 24, creatinine 1.61 on 06/17/17.  This has remained stable.  4.  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 Luan Pulling.  He is scheduled for a PET scan on 08/01/17.    Disposition: Follow up with me in 3 months.   Kate Sable, M.D., F.A.C.C.

## 2017-07-20 ENCOUNTER — Ambulatory Visit: Payer: Medicare Other | Admitting: Thoracic Surgery (Cardiothoracic Vascular Surgery)

## 2017-08-01 ENCOUNTER — Ambulatory Visit (HOSPITAL_COMMUNITY)
Admission: RE | Admit: 2017-08-01 | Discharge: 2017-08-01 | Disposition: A | Discharge status: Home / Self Care | Payer: Medicare Other | Source: Ambulatory Visit | Attending: Pulmonary Disease | Admitting: Pulmonary Disease

## 2017-08-01 DIAGNOSIS — J439 Emphysema, unspecified: Secondary | ICD-10-CM | POA: Insufficient documentation

## 2017-08-01 DIAGNOSIS — N289 Disorder of kidney and ureter, unspecified: Secondary | ICD-10-CM | POA: Diagnosis not present

## 2017-08-01 DIAGNOSIS — I2510 Atherosclerotic heart disease of native coronary artery without angina pectoris: Secondary | ICD-10-CM | POA: Diagnosis not present

## 2017-08-01 DIAGNOSIS — Z951 Presence of aortocoronary bypass graft: Secondary | ICD-10-CM | POA: Diagnosis not present

## 2017-08-01 DIAGNOSIS — R531 Weakness: Secondary | ICD-10-CM | POA: Diagnosis not present

## 2017-08-01 DIAGNOSIS — I517 Cardiomegaly: Secondary | ICD-10-CM | POA: Diagnosis not present

## 2017-08-01 DIAGNOSIS — J984 Other disorders of lung: Secondary | ICD-10-CM

## 2017-08-01 DIAGNOSIS — R933 Abnormal findings on diagnostic imaging of other parts of digestive tract: Secondary | ICD-10-CM | POA: Insufficient documentation

## 2017-08-01 DIAGNOSIS — R918 Other nonspecific abnormal finding of lung field: Secondary | ICD-10-CM | POA: Diagnosis not present

## 2017-08-01 MED ORDER — FLUDEOXYGLUCOSE F - 18 (FDG) INJECTION
10.8500 | Freq: Once | INTRAVENOUS | Status: AC
  Administered 2017-08-01: 10.85 via INTRAVENOUS

## 2017-08-08 ENCOUNTER — Other Ambulatory Visit: Payer: Self-pay

## 2017-08-08 ENCOUNTER — Encounter: Payer: Self-pay | Admitting: Thoracic Surgery (Cardiothoracic Vascular Surgery)

## 2017-08-08 ENCOUNTER — Ambulatory Visit: Payer: Medicare Other | Admitting: Thoracic Surgery (Cardiothoracic Vascular Surgery)

## 2017-08-08 VITALS — BP 172/78 | HR 53 | Resp 18 | Ht 70.0 in | Wt 199.0 lb

## 2017-08-08 DIAGNOSIS — Z951 Presence of aortocoronary bypass graft: Secondary | ICD-10-CM | POA: Diagnosis not present

## 2017-08-08 NOTE — Progress Notes (Signed)
[address]       [place] [address]             [facility] REPORT  Referring Provider is Martinique, Peter M, MD  Primary Cardiologist is Herminio Commons, MD PCP is [name], MD  Chief Complaint  Patient presents with  . Coronary Artery Disease    Evaluation for Redo CABG Cath 06/20/2017, ECHO 06/29/2017    HPI:  Patient is a 77 year old male with known history of coronary artery disease status post coronary artery bypass grafting x4 in 2003, hypertension, type 2 diabetes mellitus with peripheral neuropathy, and stage III chronic kidney disease who has been referred for surgical consultation to discuss treatment options for management of three-vessel coronary artery disease and vein graft disease with angina pectoris.  The patient's cardiac history dates back to 2003 when he underwent coronary artery bypass grafting x4 for severe three-vessel coronary artery disease with unstable angina pectoris.  Grafts placed at the time of surgery included left internal mammary artery to the distal left anterior descending coronary artery, saphenous vein graft to the first diagonal branch, saphenous vein graft to the second obtuse marginal branch of left circumflex coronary artery, and saphenous vein graft to the distal right coronary artery.  The patient recovered uneventfully and did very well from a cardiac standpoint until recently.  He states that several months ago he began to experience symptoms of substernal chest tightness and shortness of breath with physical exertion.  In early January he had a brief episode of substernal chest tightness at rest which awoke him from his sleep.  He has not had any similar episodes of chest discomfort since.  He still has episodes of chest tightness with moderate and low-level activity.  Symptoms are usually promptly relieved by rest or administration of sublingual nitroglycerin.  He has not had  any prolonged episodes of chest discomfort.  He was seen by his primary care physician and referred to Dr. Bronson Ing who saw him in early February.  He was scheduled for elective catheterization and a chest x-ray was performed demonstrating multiple pulmonary nodules.  He was referred to Dr. Luan Pulling for consultation and PET-CT scan was recommended.  He underwent diagnostic cardiac catheterization by Dr. Martinique on June 20, 2017 which revealed severe three-vessel coronary artery disease with 100% occlusion of the mid left anterior descending coronary artery, 100% proximal occlusion of the left circumflex coronary artery, and 100% occlusion of the right coronary artery.  There was continued patency of the left internal mammary artery graft to the distal left anterior descending coronary artery but patent but severe occlusive disease involving all of the previously placed saphenous vein grafts.  Elective cardiothoracic surgical consultation was requested to consider redo coronary artery bypass grafting versus high risk PCI and stenting of the patient's patent but diseased vein grafts.  Plans for PET-CT imaging were postponed for unclear reasons, but the patient's PET-CT scan was finally completed last week.  The patient presents to our office today for elective surgical consultation.  He has not yet been seen in follow-up by Dr. Luan Pulling.   Past Medical History:  Diagnosis Date  . Chronic kidney disease   . Collapsed lung   . Coronary artery disease   . Diabetes mellitus   . Hypertension   . 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   LIMA to LAD, SVG to D1, SVG to OM2, SVG to RCA, open vein  harvest right thigh and lower leg    Past Surgical History:  Procedure Laterality Date  . ANKLE FRACTURE SURGERY  2008   [facility]  . APPENDECTOMY  1960's  . CHOLECYSTECTOMY  2008  . CORONARY ARTERY BYPASS GRAFT  2003  . EYE SURGERY  2001   Cataracts removed bilaterally  . INCISIONAL HERNIA REPAIR  N/A 01/29/2015   Procedure: Fatima Blank HERNIORRHAPHY WITH MESH;  Surgeon: Aviva Signs, MD;  Location: [facility];  Service: General;  Laterality: N/A;  . INSERTION OF MESH N/A 01/29/2015   Procedure: INSERTION OF MESH;  Surgeon: Aviva Signs, MD;  Location: [facility];  Service: General;  Laterality: N/A;  . [place]  . LEFT HEART CATH AND CORS/GRAFTS ANGIOGRAPHY N/A 06/20/2017   Procedure: LEFT HEART CATH AND CORS/GRAFTS ANGIOGRAPHY;  Surgeon: Martinique, Peter M, MD;  Location: [facility];  Service: Cardiovascular;  Laterality: N/A;  . ORIF ANKLE FRACTURE  08/26/2011   Procedure: OPEN REDUCTION INTERNAL FIXATION (ORIF) ANKLE FRACTURE;  Surgeon: Sanjuana Kava, MD;  Location: [facility];  Service: Orthopedics;  Laterality: Right;  . PROSTATE SURGERY  2012   Enlarged prostate  . TRANSURETHRAL RESECTION OF PROSTATE  2012    Family History  Problem Relation Age of Onset  . Aneurysm Mother   . Heart attack Father     Social History   Socioeconomic History  . Marital status: Married    Spouse name: Not on file  . Number of children: Not on file  . Years of education: Not on file  . Highest education level: Not on file  Occupational History  . Not on file  Social Needs  . Financial resource strain: Not on file  . Food insecurity:    Worry: Not on file    Inability: Not on file  . Transportation needs:    Medical: Not on file    Non-medical: Not on file  Tobacco Use  . Smoking status: Former Smoker    Packs/day: 1.00    Years: 33.00    Pack years: 33.00    Last attempt to quit: 05/17/1987    Years since quitting: 30.2  . Smokeless tobacco: Never Used  Substance and Sexual Activity  . Alcohol use: No  . Drug use: No  . Sexual activity: Yes  Lifestyle  . Physical activity:    Days per week: Not on file    Minutes per session: Not on file  . Stress: Not on file  Relationships  . Social connections:    Talks on phone: Not on file    Gets  together: Not on file    Attends religious service: Not on file    Active member of club or organization: Not on file    Attends meetings of clubs or organizations: Not on file    Relationship status: Not on file  . Intimate partner violence:    Fear of current or ex partner: Not on file    Emotionally abused: Not on file    Physically abused: Not on file    Forced sexual activity: Not on file  Other Topics Concern  . Not on file  Social History Narrative  . Not on file    Current Outpatient Medications  Medication Sig Dispense Refill  . aspirin EC 81 MG tablet Take 81 mg by mouth daily.    Marland Kitchen atorvastatin (LIPITOR) 40 MG tablet Take 1 tablet (40 mg total) by mouth daily.  30 tablet 6  . glimepiride (AMARYL) 1 MG tablet Take 1 mg by mouth daily with breakfast.    . isosorbide mononitrate (IMDUR) 30 MG 24 hr tablet Take 1 tablet (30 mg total) by mouth daily. 90 tablet 3  . metFORMIN (GLUCOPHAGE) 500 MG tablet Take 1 tablet (500 mg total) by mouth 2 (two) times daily with a meal.    . metoprolol (LOPRESSOR) 100 MG tablet Take 100 mg by mouth 2 (two) times daily.    . pioglitazone (ACTOS) 45 MG tablet Take 45 mg by mouth daily.     No current facility-administered medications for this visit.     No Known Allergies    Review of Systems:   General:  normal appetite, decreased energy, + SLIGHT weight gain, no weight loss, no fever  Cardiac:  + chest pain with exertion, no chest pain at rest, + SOB with exertion, no resting SOB, no PND, no orthopnea, no palpitations, no arrhythmia, no atrial fibrillation, no LE edema, no dizzy spells, no syncope  Respiratory:  + exertional shortness of breath, no home oxygen, + intermittent productive cough, no dry cough, no bronchitis, no wheezing, no hemoptysis, no asthma, no pain with inspiration or cough, no sleep apnea, no CPAP at night  GI:   no difficulty swallowing, no reflux, no frequent heartburn, no hiatal hernia, no abdominal pain, no  constipation, no diarrhea, no hematochezia, no hematemesis, no melena  GU:   no dysuria,  no frequency, no urinary tract infection, no hematuria, no enlarged prostate, no kidney stones, + mild kidney disease  Vascular:  no pain suggestive of claudication, + pain in feet, no leg cramps, no varicose veins, no DVT, no non-healing foot ulcer  Neuro:   no stroke, no TIA's, no seizures, no headaches, no temporary blindness one eye,  no slurred speech, + peripheral neuropathy, no chronic pain, no instability of gait, no memory/cognitive dysfunction  Musculoskeletal: no arthritis, no joint swelling, no myalgias, no difficulty walking, normal mobility   Skin:   no rash, no itching, no skin infections, no pressure sores or ulcerations  Psych:   no anxiety, no depression, no nervousness, no unusual recent stress  Eyes:   no blurry vision, no floaters, no recent vision changes, + wears glasses or contacts  ENT:   no hearing loss, no loose or painful teeth, no dentures  Hematologic:  no easy bruising, no abnormal bleeding, no clotting disorder, no frequent epistaxis  Endocrine:  + diabetes, does check CBG's at home     Physical Exam:   BP (!) 172/78 (BP Location: Left Arm, Patient Position: Sitting, Cuff Size: Large)   Pulse (!) 53   Resp 18   Ht 5\' 10"  (1.778 m)   Wt 199 lb (90.3 kg)   SpO2 95% Comment: RA  BMI 28.55 kg/m   General:  Well-appearing  HEENT:  Unremarkable   Neck:   no JVD, no bruits, no adenopathy   Chest:   clear to auscultation, symmetrical breath sounds, no wheezes, no rhonchi   CV:   RRR, no  murmur   Abdomen:  soft, non-tender, no masses   Extremities:  warm, well-perfused, pulses diminished, no LE edema  Rectal/GU  Deferred  Neuro:   Grossly non-focal and symmetrical throughout  Skin:   Clean and dry, no rashes, no breakdown   Diagnostic Tests:  CHEST  2 VIEW  COMPARISON:  09/24/2010.  FINDINGS: Mediastinum hilar structures normal. Prior CABG. Cardiomegaly  with normal pulmonary vascularity. Multiple bilateral  pulmonary nodules are noted. Findings worrisome for metastatic disease. Infectious etiology cannot be excluded. No pleural effusion or pneumothorax.  IMPRESSION: Number diffuse multiple bilateral pulmonary nodules most suspicious for metastatic disease. Infectious etiology cannot be excluded.  2.  Prior CABG.  Stable cardiomegaly.   Electronically Signed   By: Marcello Moores  Register   On: 06/17/2017 12:59     LEFT HEART CATH AND CORS/GRAFTS ANGIOGRAPHY  Conclusion     Prox LAD to Mid LAD lesion is 100% stenosed.  Ost 1st Diag lesion is 90% stenosed.  Ost Cx to Prox Cx lesion is 100% stenosed.  Prox RCA to Dist RCA lesion is 100% stenosed.  LIMA graft was visualized by angiography and is normal in caliber.  The graft exhibits no disease.  Origin to Prox Graft lesion is 80% stenosed.  Prox Graft to Mid Graft lesion is 95% stenosed.  Dist Graft lesion is 90% stenosed.  Prox Graft to Mid Graft lesion is 90% stenosed.  Dist Graft lesion is 90% stenosed.  Origin to Prox Graft lesion is 99% stenosed.  LV end diastolic pressure is normal.   1. Severe 3 vessel obstructive CAD 2. Patent LIMA to the LAD 3. Severe SVG disease:     99% proximal SVG to the diagonal with TIMI 1 flow     Multiple high grade lesions in SVG to OM     Multiple high grade lesions in SVG to RCA 4. Normal LVEDP.  Plan: Recommend consideration for redo CABG. PCI is of limited benefit in this setting with multiple vein graft lesions in old SVGs. Will check Echo to assess LV function.   Indications   Angina pectoris ([place]) [I20.9 (ICD-10-CM)]  Procedural Details/Technique   Technical Details Indication: 77 yo WM s/p CABG 16 years ago presents with a one month history of progressive angina.  Procedural Details: The left wrist was prepped, draped, and anesthetized with 1% lidocaine. Using the modified Seldinger technique, a 6 French slender  sheath was introduced into the left radial artery. 3 mg of verapamil was administered through the sheath, weight-based unfractionated heparin was administered intravenously. Standard Judkins catheters were used for selective coronary and graft angiography and left ventricular pressures. LV angiography was not done to spare contrast with CKD. Catheter exchanges were performed over an exchange length guidewire. There were no immediate procedural complications. A TR band was used for radial hemostasis at the completion of the procedure. The patient was transferred to the post catheterization recovery area for further monitoring.  Contrast: 95 cc   Estimated blood loss <50 mL.  During this procedure the patient was administered the following to achieve and maintain moderate conscious sedation: Versed 1 mg, Fentanyl 25 mcg, while the patient's heart rate, blood pressure, and oxygen saturation were continuously monitored. The period of conscious sedation was 25 minutes, of which I was present face-to-face 100% of this time.  Complications   Complications documented before study signed (06/20/2017 11:45 AM EST)    No complications were associated with this study.  Documented by Martinique, Peter M, MD - 06/20/2017 11:43 AM EST    Coronary Findings   Diagnostic  Dominance: Right  Left Anterior Descending  Prox LAD to Mid LAD lesion 100% stenosed  Prox LAD to Mid LAD lesion is 100% stenosed.  First Diagonal Branch  Ost 1st Diag lesion 90% stenosed  Ost 1st Diag lesion is 90% stenosed.  Left Circumflex  Ost Cx to Prox Cx lesion 100% stenosed  Ost Cx to Prox Cx lesion  is 100% stenosed.  Right Coronary Artery  Prox RCA to Dist RCA lesion 100% stenosed  Prox RCA to Dist RCA lesion is 100% stenosed.  LIMA LIMA Graft to Mid LAD  LIMA graft was visualized by angiography and is normal in caliber. The graft exhibits no disease.  Graft to 2nd Mrg  Origin to Prox Graft lesion 80% stenosed  Origin to Prox  Graft lesion is 80% stenosed.  Prox Graft to Mid Graft lesion 95% stenosed  Prox Graft to Mid Graft lesion is 95% stenosed.  Dist Graft lesion 90% stenosed  Dist Graft lesion is 90% stenosed.  Graft to 1st Diag  Origin to Prox Graft lesion 99% stenosed  Origin to Prox Graft lesion is 99% stenosed. The lesion is thrombotic.  Graft to Dist RCA  Prox Graft to Mid Graft lesion 90% stenosed  Prox Graft to Mid Graft lesion is 90% stenosed.  Dist Graft lesion 90% stenosed  Dist Graft lesion is 90% stenosed.  Intervention   No interventions have been documented.  Left Heart   Left Ventricle LV end diastolic pressure is normal.  Coronary Diagrams   Diagnostic Diagram       Implants    No implant documentation for this case.  MERGE Images   Show images for CARDIAC CATHETERIZATION   Link to Procedure Log   Procedure Log    Hemo Data    Most Recent Value  AO Systolic Pressure 638 mmHg  AO Diastolic Pressure 69 mmHg  AO Mean 466 mmHg  LV Systolic Pressure 599 mmHg  LV Diastolic Pressure 1 mmHg  LV EDP 18 mmHg  Arterial Occlusion Pressure Extended Systolic Pressure 357 mmHg  Arterial Occlusion Pressure Extended Diastolic Pressure 67 mmHg  Arterial Occlusion Pressure Extended Mean Pressure 112 mmHg  Left Ventricular Apex Extended Systolic Pressure 017 mmHg  Left Ventricular Apex Extended Diastolic Pressure 0 mmHg  Left Ventricular Apex Extended EDP Pressure 26 mmHg    Transthoracic Echocardiography  Patient:    Regina, Coppolino MR #:       [id number] Study Date: 06/29/2017 Gender:     M Age:        42 Height:     180.3 cm Weight:     88.5 kg BSA:        2.12 m^2 Pt. Status: Room:   SONOGRAPHER  [facility]  ATTENDING    Kate Sable, MD  [place], MD  [place], MD  PERFORMING   [facility] [place]  cc:  ------------------------------------------------------------------- LV EF: 60% -    65%  ------------------------------------------------------------------- History:   PMH:   Chest pain.  Coronary artery disease.  Risk factors:  Former tobacco use. Diabetes mellitus. Dyslipidemia. CKD.   ------------------------------------------------------------------- Study Conclusions  - Left ventricle: The cavity size was normal. There was mild focal   basal hypertrophy of the septum. Systolic function was normal.   The estimated ejection fraction was in the range of 60% to 65%.   Wall motion was normal; there were no regional wall motion   abnormalities. Doppler parameters are consistent with abnormal   left ventricular relaxation (grade 1 diastolic dysfunction). - Aortic valve: Mildly calcified annulus. Trileaflet. Valve area   (Vmax): 2.13 cm^2. - Mitral valve: There was mild regurgitation. - Right atrium: Central venous pressure (est): 3 mm Hg. - Atrial septum: No defect or patent foramen ovale was identified. - Tricuspid valve: There was mild regurgitation. - Pulmonary arteries: PA peak  pressure: 27 mm Hg (S). - Pericardium, extracardiac: There was no pericardial effusion.  ------------------------------------------------------------------- Labs, prior tests, procedures, and surgery: Coronary artery bypass grafting.  ------------------------------------------------------------------- Study data:  No prior study was available for comparison.  Study status:  Routine.  Procedure:  The patient reported no pain pre or post test. Transthoracic echocardiography. Image quality was adequate.  Study completion:  There were no complications. Transthoracic echocardiography.  M-mode, complete 2D, spectral Doppler, and color Doppler.  Birthdate:  Patient birthdate: 05/29/1940.  Age:  Patient is 77 yr old.  Sex:  Gender: male. BMI: 27.2 kg/m^2.  Blood pressure:     140/80  Patient status: Outpatient.  Study date:  Study date: 06/29/2017. Study time:  09:51 AM.  -------------------------------------------------------------------  ------------------------------------------------------------------- Left ventricle:  The cavity size was normal. There was mild focal basal hypertrophy of the septum. Systolic function was normal. The estimated ejection fraction was in the range of 60% to 65%. Wall motion was normal; there were no regional wall motion abnormalities. Doppler parameters are consistent with abnormal left ventricular relaxation (grade 1 diastolic dysfunction).  ------------------------------------------------------------------- Aortic valve:   Mildly calcified annulus. Trileaflet.  Doppler: There was no significant regurgitation.    Peak velocity ratio of LVOT to aortic valve: 0.61. Valve area (Vmax): 2.13 cm^2. Indexed valve area (Vmax): 1.01 cm^2/m^2.    Peak gradient (S): 14 mm Hg.   ------------------------------------------------------------------- Aorta:  Aortic root: The aortic root was normal in size.  ------------------------------------------------------------------- Mitral valve:   The valve appears to be grossly normal.    Doppler:  There was mild regurgitation.    Peak gradient (D): 3 mm Hg.  ------------------------------------------------------------------- Left atrium:  The atrium was normal in size.  ------------------------------------------------------------------- Atrial septum:  No defect or patent foramen ovale was identified.   ------------------------------------------------------------------- Right ventricle:  The cavity size was normal. Systolic function was normal.  ------------------------------------------------------------------- Pulmonic valve:    The valve appears to be grossly normal. Doppler:  There was no significant regurgitation.  ------------------------------------------------------------------- Tricuspid valve:   The valve appears to be grossly normal. Doppler:  There  was mild regurgitation.  ------------------------------------------------------------------- Right atrium:  The atrium was normal in size.  ------------------------------------------------------------------- Pericardium:  There was no pericardial effusion.  ------------------------------------------------------------------- Systemic veins: Inferior vena cava: The vessel was normal in size. The respirophasic diameter changes were in the normal range (>= 50%), consistent with normal central venous pressure.  ------------------------------------------------------------------- Measurements   Left ventricle                           Value          Reference  LV ID, ED, PLAX chordal                  43.5  mm       43 - 52  LV ID, ES, PLAX chordal                  28    mm       23 - 38  LV fx shortening, PLAX chordal           36    %        >=29  LV PW thickness, ED                      6.32  mm       ----------  IVS/LV PW ratio, ED              (  H)     1.82           <=1.3  Stroke volume, 2D                        87    ml       ----------  Stroke volume/bsa, 2D                    41    ml/m^2   ----------  LV ejection fraction, 1-p A4C            66    %        ----------  LV end-diastolic volume, 2-p             77    ml       ----------  LV end-systolic volume, 2-p              22    ml       ----------  LV ejection fraction, 2-p                72    %        ----------  Stroke volume, 2-p                       56    ml       ----------  LV end-diastolic volume/bsa, 2-p         37    ml/m^2   ----------  LV end-systolic volume/bsa, 2-p          10    ml/m^2   ----------  Stroke volume/bsa, 2-p                   26.2  ml/m^2   ----------  LV e&', lateral                           7.35  cm/s     ----------  LV E/e&', lateral                         11.66          ----------  LV e&', medial                            5.13  cm/s     ----------  LV E/e&', medial                           16.71          ----------  LV e&', average                           6.24  cm/s     ----------  LV E/e&', average                         13.73          ----------    Ventricular septum                       Value          Reference  IVS thickness, ED  11.5  mm       ----------    LVOT                                     Value          Reference  LVOT ID, S                               21    mm       ----------  LVOT area                                3.46  cm^2     ----------  LVOT peak velocity, S                    115   cm/s     ----------  LVOT mean velocity, S                    71.5  cm/s     ----------  LVOT VTI, S                              25    cm       ----------  LVOT peak gradient, S                    5     mm Hg    ----------    Aortic valve                             Value          Reference  Aortic valve peak velocity, S            187   cm/s     ----------  Aortic peak gradient, S                  14    mm Hg    ----------  Velocity ratio, peak, LVOT/AV            0.61           ----------  Aortic valve area, peak velocity         2.13  cm^2     ----------  Aortic valve area/bsa, peak              1.01  cm^2/m^2 ----------  velocity    Aorta                                    Value          Reference  Aortic root ID, ED                       35    mm       ----------    Left atrium                              Value  Reference  LA ID, A-P, ES                           38    mm       ----------  LA ID/bsa, A-P                           1.79  cm/m^2   <=2.2  LA volume, S                             65.7  ml       ----------  LA volume/bsa, S                         31    ml/m^2   ----------  LA volume, ES, 1-p A4C                   55.9  ml       ----------  LA volume/bsa, ES, 1-p A4C               26.4  ml/m^2   ----------  LA volume, ES, 1-p A2C                   73.1  ml       ----------  LA volume/bsa, ES, 1-p A2C                34.5  ml/m^2   ----------    Mitral valve                             Value          Reference  Mitral E-wave peak velocity              85.7  cm/s     ----------  Mitral A-wave peak velocity              103   cm/s     ----------  Mitral deceleration time         (H)     248   ms       150 - 230  Mitral peak gradient, D                  3     mm Hg    ----------  Mitral E/A ratio, peak                   0.8            ----------  Mitral maximal regurg velocity,          375   cm/s     ----------  PISA    Pulmonary arteries                       Value          Reference  PA pressure, S, DP                       27    mm Hg    <=30    Tricuspid valve  Value          Reference  Tricuspid regurg peak velocity           247   cm/s     ----------  Tricuspid peak RV-RA gradient            24    mm Hg    ----------    Right atrium                             Value          Reference  RA ID, S-I, ES, A4C                      46    mm       34 - 49  RA area, ES, A4C                         11.8  cm^2     8.3 - 19.5  RA volume, ES, A/L                       24.3  ml       ----------  RA volume/bsa, ES, A/L                   11.5  ml/m^2   ----------    Systemic veins                           Value          Reference  Estimated CVP                            3     mm Hg    ----------    Right ventricle                          Value          Reference  TAPSE                                    11.4  mm       ----------  RV pressure, S, DP                       27    mm Hg    <=30  RV s&', lateral, S                        7.74  cm/s     ----------  Legend: (L)  and  (H)  mark values outside specified reference range.  ------------------------------------------------------------------- Prepared and Electronically Authenticated by  Rozann Lesches, M.D. 2019-02-13T12:48:56   NUCLEAR MEDICINE PET SKULL BASE TO THIGH  TECHNIQUE: 10.9 mCi F-18 FDG was  injected intravenously. Full-ring PET imaging was performed from the skull base to thigh after the radiotracer. CT data was obtained and used for attenuation correction and anatomic localization.  Fasting blood glucose: 149 mg/dl  Mediastinal blood pool activity: SUV max 3.2  COMPARISON:  Chest radiograph 06/17/2017  FINDINGS: NECK: No significant abnormal hypermetabolic findings in the neck.  Incidental CT  findings: 2.0 cm in long axis left thyroid nodule is not hypermetabolic and is likely benign. Bilateral carotid atherosclerotic calcifications.  CHEST: Numerous scattered pulmonary nodules throughout both lungs. Random distribution. All lobes are involved. An index 1.3 by 1.0 cm nodule in the left upper lobe on image 102/3 has a maximum SUV of 4.0. Many of the nodules are technically too small to characterize although for the most part appear likely to be associated with accentuated metabolic activity.  No appreciable pathologic or hypermetabolic adenopathy.  Incidental CT findings: Coronary, aortic arch, and branch vessel atherosclerotic vascular disease. Mild cardiomegaly. Prior CABG. Centrilobular emphysema.  ABDOMEN/PELVIS: Scattered regions of accentuated metabolic activity in the bowel, likely physiologic given the lack of correlate of CT abnormalities.  Incidental CT findings: Cholecystectomy. Aortoiliac atherosclerotic vascular disease. Probable prior TURP. Slight lobularity of the left kidney upper pole posteriorly is technically nonspecific.  SKELETON: No hypermetabolic bony findings.  Incidental CT findings: Unremarkable  IMPRESSION: 1. Innumerable randomly distributed pulmonary nodules with weak metabolic activity. The largest of these measures 1.3 by 1.0 cm in the left upper lobe and has a maximum SUV of 4.0 (background blood pool activity is 3.2). No separate hypermetabolic primary tumor is identified. Possibilities might include miliary  infection from tuberculosis or fungal disease; or low-grade metastatic disease from unknown primary. If the patient does not have clinical symptoms of infection then biopsy may be warranted. 2. Accentuated activity throughout much of the bowel. This is probably physiologic given the lack of correlate of CT findings. The degree of physiologic activity could mask or height underlying bowel neoplasm. No abnormal findings in the liver. 3. Slight nodularity of the upper pole of the left kidney posteriorly, probably from a slightly complex cyst, but technically nonspecific. 4. Aortic Atherosclerosis (ICD10-I70.0) and Emphysema (ICD10-J43.9). Coronary atherosclerosis. Mild cardiomegaly. Prior CABG.   Electronically Signed   By: Van Clines M.D.   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  He describes a several month history of worsening symptoms of exertional chest tightness and shortness of breath consistent with angina pectoris.  With exception of one brief episode of pain that occurred several months ago, he has not had any chest pain at rest nor any prolonged episodes of chest discomfort.  Symptoms occur regularly and do limit his physical activity considerably.  I have personally reviewed the patient's recent diagnostic cardiac catheterization and transthoracic echocardiogram.  Left ventricular systolic function remains normal.  Catheterization reveals chronic occlusion of all 3 primary native epicardial coronary arteries.  There is continued patency of the left internal mammary artery to the distal left anterior descending coronary artery without any significant disease.  There is continued patency but severe disease involving all 3 of the previous vein grafts.  I agree the patient would likely benefit from coronary revascularization.  Redo coronary  artery bypass grafting would likely be associated with improved long-term survival, and PCI and stenting of the patient's patent but diseased vein grafts would come with some risk and likely diminished long-term disease-free survival.  Unfortunately, recent chest x-ray and PET-CT scan reveals widespread bilateral small pulmonary nodules with some increased metabolic activity, worrisome for the possibility of metastatic disease of unknown primary, although definitive diagnosis has not been established.  I would not consider this patient a candidate for elective redo coronary artery bypass grafting in the setting of widespread metastatic disease.  On  the other hand, patient would likely be at significant risk for acute myocardial infarction if open surgical biopsy is necessary to establish a definitive diagnosis for the patient's underlying pulmonary nodules.   Plan:  We will request follow-up with the patient's pulmonologist, Dr. Luan Pulling, as soon as practical.  We will also request for consultation with medical oncology.  Whether or not the patient's diagnosis for his widespread pulmonary nodules can be established without open surgical biopsy remains unclear at this time.  We will make certain the patient has a follow-up appointment as soon as possible with his cardiologist, Dr. Bronson Ing, to make certain that his antianginal medications have been optimized and further discuss the possibility of high risk PCI and stenting.  The patient will return to our office for follow-up in 3-4 weeks.  All questions answered.   I spent in excess of 90 minutes during the conduct of this office consultation and >50% of this time involved direct face-to-face encounter with the patient for counseling and/or coordination of their care.    Valentina Gu. Roxy Manns, MD 08/08/2017 10:02 AM

## 2017-08-08 NOTE — Patient Instructions (Addendum)
Continue all previous medications without any changes at this time  Call your cardiologist or go to emergency if you have chest discomfort unrelieved by administration of nitroglycerine

## 2017-08-15 ENCOUNTER — Other Ambulatory Visit (HOSPITAL_COMMUNITY): Payer: Self-pay | Admitting: Pulmonary Disease

## 2017-08-15 DIAGNOSIS — I10 Essential (primary) hypertension: Secondary | ICD-10-CM | POA: Diagnosis not present

## 2017-08-15 DIAGNOSIS — R911 Solitary pulmonary nodule: Secondary | ICD-10-CM

## 2017-08-15 DIAGNOSIS — I25119 Atherosclerotic heart disease of native coronary artery with unspecified angina pectoris: Secondary | ICD-10-CM | POA: Diagnosis not present

## 2017-08-15 DIAGNOSIS — J984 Other disorders of lung: Secondary | ICD-10-CM | POA: Diagnosis not present

## 2017-08-17 ENCOUNTER — Other Ambulatory Visit: Payer: Self-pay | Admitting: Radiology

## 2017-08-18 ENCOUNTER — Encounter (HOSPITAL_COMMUNITY): Payer: Self-pay

## 2017-08-18 ENCOUNTER — Ambulatory Visit (HOSPITAL_COMMUNITY)
Admission: RE | Admit: 2017-08-18 | Discharge: 2017-08-18 | Disposition: A | Discharge status: Home / Self Care | Payer: Medicare Other | Source: Ambulatory Visit | Attending: Interventional Radiology | Admitting: Interventional Radiology

## 2017-08-18 ENCOUNTER — Ambulatory Visit (HOSPITAL_COMMUNITY)
Admission: RE | Admit: 2017-08-18 | Discharge: 2017-08-18 | Disposition: A | Discharge status: Home / Self Care | Payer: Medicare Other | Source: Ambulatory Visit | Attending: Pulmonary Disease | Admitting: Pulmonary Disease

## 2017-08-18 DIAGNOSIS — C3431 Malignant neoplasm of lower lobe, right bronchus or lung: Secondary | ICD-10-CM | POA: Diagnosis not present

## 2017-08-18 DIAGNOSIS — Z7984 Long term (current) use of oral hypoglycemic drugs: Secondary | ICD-10-CM | POA: Diagnosis not present

## 2017-08-18 DIAGNOSIS — I129 Hypertensive chronic kidney disease with stage 1 through stage 4 chronic kidney disease, or unspecified chronic kidney disease: Secondary | ICD-10-CM | POA: Diagnosis not present

## 2017-08-18 DIAGNOSIS — R911 Solitary pulmonary nodule: Secondary | ICD-10-CM

## 2017-08-18 DIAGNOSIS — Z87891 Personal history of nicotine dependence: Secondary | ICD-10-CM | POA: Insufficient documentation

## 2017-08-18 DIAGNOSIS — Z79899 Other long term (current) drug therapy: Secondary | ICD-10-CM | POA: Insufficient documentation

## 2017-08-18 DIAGNOSIS — I700 Atherosclerosis of aorta: Secondary | ICD-10-CM | POA: Diagnosis not present

## 2017-08-18 DIAGNOSIS — J939 Pneumothorax, unspecified: Secondary | ICD-10-CM | POA: Diagnosis not present

## 2017-08-18 DIAGNOSIS — I2510 Atherosclerotic heart disease of native coronary artery without angina pectoris: Secondary | ICD-10-CM | POA: Insufficient documentation

## 2017-08-18 DIAGNOSIS — N183 Chronic kidney disease, stage 3 (moderate): Secondary | ICD-10-CM | POA: Diagnosis not present

## 2017-08-18 DIAGNOSIS — J95811 Postprocedural pneumothorax: Secondary | ICD-10-CM

## 2017-08-18 DIAGNOSIS — Z7982 Long term (current) use of aspirin: Secondary | ICD-10-CM | POA: Diagnosis not present

## 2017-08-18 DIAGNOSIS — Z951 Presence of aortocoronary bypass graft: Secondary | ICD-10-CM | POA: Diagnosis not present

## 2017-08-18 DIAGNOSIS — R918 Other nonspecific abnormal finding of lung field: Secondary | ICD-10-CM | POA: Diagnosis present

## 2017-08-18 DIAGNOSIS — E1122 Type 2 diabetes mellitus with diabetic chronic kidney disease: Secondary | ICD-10-CM | POA: Diagnosis not present

## 2017-08-18 DIAGNOSIS — C3490 Malignant neoplasm of unspecified part of unspecified bronchus or lung: Secondary | ICD-10-CM | POA: Diagnosis not present

## 2017-08-18 LAB — CBC
HCT: 41.4 % (ref 39.0–52.0)
Hemoglobin: 13.8 g/dL (ref 13.0–17.0)
MCH: 27.5 pg (ref 26.0–34.0)
MCHC: 33.3 g/dL (ref 30.0–36.0)
MCV: 82.6 fL (ref 78.0–100.0)
PLATELETS: 191 10*3/uL (ref 150–400)
RBC: 5.01 MIL/uL (ref 4.22–5.81)
RDW: 14 % (ref 11.5–15.5)
WBC: 8.7 10*3/uL (ref 4.0–10.5)

## 2017-08-18 LAB — PROTIME-INR
INR: 1.04
Prothrombin Time: 13.5 seconds (ref 11.4–15.2)

## 2017-08-18 LAB — GLUCOSE, CAPILLARY: Glucose-Capillary: 151 mg/dL — ABNORMAL HIGH (ref 65–99)

## 2017-08-18 MED ORDER — MIDAZOLAM HCL 2 MG/2ML IJ SOLN
INTRAMUSCULAR | Status: AC
  Filled 2017-08-18: qty 4

## 2017-08-18 MED ORDER — FENTANYL CITRATE (PF) 100 MCG/2ML IJ SOLN
INTRAMUSCULAR | Status: AC | PRN
  Administered 2017-08-18 (×2): 12.5 ug via INTRAVENOUS
  Administered 2017-08-18: 50 ug via INTRAVENOUS

## 2017-08-18 MED ORDER — SODIUM CHLORIDE 0.9 % IV SOLN
INTRAVENOUS | Status: DC
  Administered 2017-08-18: 12:00:00 via INTRAVENOUS

## 2017-08-18 MED ORDER — FENTANYL CITRATE (PF) 100 MCG/2ML IJ SOLN
INTRAMUSCULAR | Status: AC
  Filled 2017-08-18: qty 4

## 2017-08-18 MED ORDER — MIDAZOLAM HCL 2 MG/2ML IJ SOLN
INTRAMUSCULAR | Status: AC | PRN
  Administered 2017-08-18: 0.5 mg via INTRAVENOUS
  Administered 2017-08-18: 1 mg via INTRAVENOUS

## 2017-08-18 MED ORDER — HYDRALAZINE HCL 20 MG/ML IJ SOLN
10.0000 mg | Freq: Once | INTRAMUSCULAR | Status: DC | PRN
  Filled 2017-08-18: qty 0.5

## 2017-08-18 MED ORDER — LIDOCAINE HCL 1 % IJ SOLN
INTRAMUSCULAR | Status: AC
  Administered 2017-08-18: 20 mL
  Filled 2017-08-18: qty 20

## 2017-08-18 NOTE — Sedation Documentation (Signed)
Patient is resting comfortably. 

## 2017-08-18 NOTE — Procedures (Signed)
Interventional Radiology Procedure Note  Procedure: CT guided biopsy of right lower lobe nodule. Mx bx's of right LL nodule.  Complications: None Recommendations: - Bedrest until CXR cleared.  Minimize talking, coughing or otherwise straining.  - Follow up 1 hr CXR pending  - NPO until CXR cleared  Signed,  Corrie Mckusick, DO

## 2017-08-18 NOTE — Discharge Instructions (Signed)
Lung Biopsy, Care After °This sheet gives you information about how to care for yourself after your procedure. Your health care provider may also give you more specific instructions depending on the type of biopsy you had. If you have problems or questions, contact your health care provider. °What can I expect after the procedure? °After the procedure, it is common to have: °· A cough. °· A sore throat. °· Pain where a needle, bronchoscope, or incision was used to collect a biopsy sample (biopsy site). ° °Follow these instructions at home: °Medicines °· Take over-the-counter and prescription medicines only as told by your health care provider. °· Do not drive for 24 hours if you were given a sedative. °· Do not drink alcohol while taking pain medicine. °· Do not drive or use heavy machinery while taking prescription pain medicine. °· To prevent or treat constipation while you are taking prescription pain medicine, your health care provider may recommend that you: °? Drink enough fluid to keep your urine clear or pale yellow. °? Take over-the-counter or prescription medicines. °? Eat foods that are high in fiber, such as fresh fruits and vegetables, whole grains, and beans. °? Limit foods that are high in fat and processed sugars, such as fried and sweet foods. °Activity °· If you had an incision during your procedure, avoid activities that may pull the incision site open. °· Return to your normal activities as told by your health care provider. Ask your health care provider what activities are safe for you. °If you had an open biopsy:  °· Follow instructions from your health care provider about how to take care of your incision. Make sure you: °? Wash your hands with soap and water before you change your bandage (dressing). If soap and water are not available, use hand sanitizer. °? Change your dressing as told by your health care provider. °? Leave stitches (sutures), skin glue, or adhesive strips in place. These  skin closures may need to stay in place for 2 weeks or longer. If adhesive strip edges start to loosen and curl up, you may trim the loose edges. Do not remove adhesive strips completely unless your health care provider tells you to do that. °· Check your incision area every day for signs of infection. Check for: °? Redness, swelling, or pain. °? Fluid or blood. °? Warmth. °? Pus or a bad smell. °General instructions °· It is up to you to get the results of your procedure. Ask your health care provider, or the department that is doing the procedure, when your results will be ready. °Contact a health care provider if: °· You have a fever. °· You have redness, swelling, or pain around your biopsy site. °· You have fluid or blood coming from your biopsy site. °· Your biopsy site feels warm to the touch. °· You have pus or a bad smell coming from your biopsy site. °Get help right away if: °· You cough up blood. °· You have trouble breathing. °· You have chest pain. °Summary °· After the procedure, it is common to have a sore throat and a cough. °· Return to your normal activities as told by your health care provider. Ask your health care provider what activities are safe for you. °· Take over-the-counter and prescription medicines only as told by your health care provider. °· Report any unusual symptoms to your health care provider. °This information is not intended to replace advice given to you by your health care provider. Make sure   you discuss any questions you have with your health care provider. °Document Released: 06/01/2016 Document Revised: 06/01/2016 Document Reviewed: 06/01/2016 °Elsevier Interactive Patient Education © 2018 [employer] ° °

## 2017-08-18 NOTE — Sedation Documentation (Signed)
Dr. Earleen Newport made aware of pt's elevated BP.

## 2017-08-18 NOTE — H&P (Signed)
Chief Complaint: Lung mass  Referring Physician(s): Hawkins,Edward  Supervising Physician: Corrie Mckusick  Patient Status: [facility] - Out-pt  History of Present Illness: [name] is a 77 y.o. male with known history of coronary artery disease status post coronary artery bypass grafting x4 in 2003, hypertension, type 2 diabetes mellitus with peripheral neuropathy, and stage III chronic kidney disease.  Chest x-ray on 06/17/17 showed multiple bilateral pulmonary nodules worrisome for metastatic disease.  An infectious etiology could not entirely be excluded.    He then underwent a PET scan on 08/02/2017 which showed numerous scattered pulmonary nodules throughout both lungs.   Random distribution. All lobes are involved. An index 1.3 by 1.0 cm nodule in the left upper lobe on image 102/3 has a maximum SUV of 4.0.   Many of the nodules are technically too small to characterize although for the most part appear likely to be associated with accentuated metabolic activity.  He is here today for CT guided biopsy of the RLL peripheral lung base nodule.  He tells me he has history of pneumothorax in the past with placement of of a chest tube on the right back in 2012.  He is NPO. He has extensive cardiac history but does not take blood thinners.   Past Medical History:  Diagnosis Date  . Chronic kidney disease   . Coronary artery disease   . Diabetes mellitus   . Hypertension   . 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 Spontaneous pneumothorax 09/15/2010   right    Past Surgical History:  Procedure Laterality Date  . ANKLE FRACTURE SURGERY  2008   [facility]  . APPENDECTOMY  1960's  . CHEST TUBE INSERTION Right 09/15/2010   Dr Servando Snare - spontaneous PTX  . CHOLECYSTECTOMY  2008  . CORONARY ARTERY BYPASS GRAFT  2003  . EYE SURGERY  2001   Cataracts removed bilaterally    . INCISIONAL HERNIA REPAIR N/A 01/29/2015   Procedure: Fatima Blank HERNIORRHAPHY WITH MESH;  Surgeon: Aviva Signs, MD;  Location: [facility];  Service: General;  Laterality: N/A;  . INSERTION OF MESH N/A 01/29/2015   Procedure: INSERTION OF MESH;  Surgeon: Aviva Signs, MD;  Location: [facility];  Service: General;  Laterality: N/A;  . [place]  . LEFT HEART CATH AND CORS/GRAFTS ANGIOGRAPHY N/A 06/20/2017   Procedure: LEFT HEART CATH AND CORS/GRAFTS ANGIOGRAPHY;  Surgeon: Martinique, Peter M, MD;  Location: [place] CV [facility];  Service: Cardiovascular;  Laterality: N/A;  . ORIF ANKLE FRACTURE  08/26/2011   Procedure: OPEN REDUCTION INTERNAL FIXATION (ORIF) ANKLE FRACTURE;  Surgeon: Sanjuana Kava, MD;  Location: [facility];  Service: Orthopedics;  Laterality: Right;  . PROSTATE SURGERY  2012   Enlarged prostate  . TRANSURETHRAL RESECTION OF PROSTATE  2012    Allergies: Patient has no known allergies.  Medications: Prior to Admission medications   Medication Sig Start Date End Date Taking? Authorizing Provider  aspirin EC 81 MG tablet Take 81 mg by mouth daily.   Yes [provider]  atorvastatin (LIPITOR) 40 MG tablet Take 1 tablet (40 mg total) by mouth daily. 06/17/17 09/15/17 Yes Herminio Commons, MD  glimepiride (AMARYL) 2 MG tablet Take 2 mg by mouth daily with breakfast.    Yes [provider]  isosorbide mononitrate (IMDUR) 30 MG 24  hr tablet Take 1 tablet (30 mg total) by mouth daily. 07/19/17 10/17/17 Yes Herminio Commons, MD  metFORMIN (GLUCOPHAGE) 500 MG tablet Take 1 tablet (500 mg total) by mouth 2 (two) times daily with a meal. 06/23/17  Yes Martinique, Peter M, MD  metoprolol (LOPRESSOR) 100 MG tablet Take 100 mg by mouth 2 (two) times daily.   Yes [provider]  nitroGLYCERIN (NITROSTAT) 0.4 MG SL tablet Place 0.4 mg under the tongue every 5 (five) minutes as needed for chest pain.  06/01/17  Yes [provider]  ONE TOUCH  ULTRA TEST test strip  06/07/17  Yes [provider]  pioglitazone (ACTOS) 45 MG tablet Take 45 mg by mouth daily.   Yes [provider]     Family History  Problem Relation Age of Onset  . Aneurysm Mother   . Heart attack Father     Social History   Socioeconomic History  . Marital status: Married    Spouse name: Not on file  . Number of children: Not on file  . Years of education: Not on file  . Highest education level: Not on file  Occupational History  . Not on file  Social Needs  . Financial resource strain: Not on file  . Food insecurity:    Worry: Not on file    Inability: Not on file  . Transportation needs:    Medical: Not on file    Non-medical: Not on file  Tobacco Use  . Smoking status: Former Smoker    Packs/day: 1.00    Years: 33.00    Pack years: 33.00    Last attempt to quit: 05/17/1987    Years since quitting: 30.2  . Smokeless tobacco: Never Used  Substance and Sexual Activity  . Alcohol use: No  . Drug use: No  . Sexual activity: Yes  Lifestyle  . Physical activity:    Days per week: Not on file    Minutes per session: Not on file  . Stress: Not on file  Relationships  . Social connections:    Talks on phone: Not on file    Gets together: Not on file    Attends religious service: Not on file    Active member of club or organization: Not on file    Attends meetings of clubs or organizations: Not on file    Relationship status: Not on file  Other Topics Concern  . Not on file  Social History Narrative  . Not on file    Review of Systems: A 12 point ROS discussed and pertinent positives are indicated in the HPI above.  All other systems are negative. Review of Systems  Vital Signs: BP (!) 182/71   Pulse (!) 51   Temp 98.1 F (36.7 C)   Resp 20   Ht 5\' 10"  (1.778 m)   Wt 198 lb (89.8 kg)   SpO2 97%   BMI 28.41 kg/m   Physical Exam  Constitutional: He is oriented to person, place, and time. He appears  well-developed.  HENT:  Head: Normocephalic and atraumatic.  Eyes: EOM are normal.  Cardiovascular: Normal rate, regular rhythm and normal heart sounds.  Pulmonary/Chest: Effort normal.  Coarse breath sounds bilaterally  Abdominal: He exhibits no distension.  Musculoskeletal: Normal range of motion.  Neurological: He is alert and oriented to person, place, and time.  Skin: Skin is warm and dry.  Psychiatric: He has a normal mood and affect. His behavior is normal. Judgment and  thought content normal.  Vitals reviewed.   Imaging: Nm Pet Image Initial (pi) Skull Base To Thigh  Result Date: 08/02/2017 CLINICAL DATA:  Initial treatment strategy for pulmonary nodules. EXAM: NUCLEAR MEDICINE PET SKULL BASE TO THIGH TECHNIQUE: 10.9 mCi F-18 FDG was injected intravenously. Full-ring PET imaging was performed from the skull base to thigh after the radiotracer. CT data was obtained and used for attenuation correction and anatomic localization. Fasting blood glucose: 149 mg/dl Mediastinal blood pool activity: SUV max 3.2 COMPARISON:  Chest radiograph 06/17/2017 FINDINGS: NECK: No significant abnormal hypermetabolic findings in the neck. Incidental CT findings: 2.0 cm in long axis left thyroid nodule is not hypermetabolic and is likely benign. Bilateral carotid atherosclerotic calcifications. CHEST: Numerous scattered pulmonary nodules throughout both lungs. Random distribution. All lobes are involved. An index 1.3 by 1.0 cm nodule in the left upper lobe on image 102/3 has a maximum SUV of 4.0. Many of the nodules are technically too small to characterize although for the most part appear likely to be associated with accentuated metabolic activity. No appreciable pathologic or hypermetabolic adenopathy. Incidental CT findings: Coronary, aortic arch, and branch vessel atherosclerotic vascular disease. Mild cardiomegaly. Prior CABG. Centrilobular emphysema. ABDOMEN/PELVIS: Scattered regions of accentuated  metabolic activity in the bowel, likely physiologic given the lack of correlate of CT abnormalities. Incidental CT findings: Cholecystectomy. Aortoiliac atherosclerotic vascular disease. Probable prior TURP. Slight lobularity of the left kidney upper pole posteriorly is technically nonspecific. SKELETON: No hypermetabolic bony findings. Incidental CT findings: Unremarkable IMPRESSION: 1. Innumerable randomly distributed pulmonary nodules with weak metabolic activity. The largest of these measures 1.3 by 1.0 cm in the left upper lobe and has a maximum SUV of 4.0 (background blood pool activity is 3.2). No separate hypermetabolic primary tumor is identified. Possibilities might include miliary infection from tuberculosis or fungal disease; or low-grade metastatic disease from unknown primary. If the patient does not have clinical symptoms of infection then biopsy may be warranted. 2. Accentuated activity throughout much of the bowel. This is probably physiologic given the lack of correlate of CT findings. The degree of physiologic activity could mask or height underlying bowel neoplasm. No abnormal findings in the liver. 3. Slight nodularity of the upper pole of the left kidney posteriorly, probably from a slightly complex cyst, but technically nonspecific. 4. Aortic Atherosclerosis (ICD10-I70.0) and Emphysema (ICD10-J43.9). Coronary atherosclerosis. Mild cardiomegaly. Prior CABG. Electronically Signed   By: Van Clines M.D.   On: 08/02/2017 08:37    Labs:  CBC: Recent Labs    06/17/17 0951  WBC 9.8  HGB 14.9  HCT 44.8  PLT 206    COAGS: Recent Labs    06/17/17 0951  INR 1.01  APTT 33    BMP: Recent Labs    06/17/17 0951  NA 133*  K 4.8  CL 97*  CO2 27  GLUCOSE 179*  BUN 24*  CALCIUM 9.4  CREATININE 1.61*  GFRNONAA 40*  GFRAA 46*    LIVER FUNCTION TESTS: No results for input(s): BILITOT, AST, ALT, ALKPHOS, PROT, ALBUMIN in the last 8760 hours.  TUMOR MARKERS: No  results for input(s): AFPTM, CEA, CA199, CHROMGRNA in the last 8760 hours.  Assessment and Plan:  Numerous lung nodules.  Will proceed with Right lung biopsy today  By Dr. Earleen Newport.  Risks and benefits discussed with the patient including, but not limited to bleeding, hemoptysis, respiratory failure requiring intubation, infection, pneumothorax requiring chest tube placement, stroke from air embolism or even death.  All of the patient's questions  were answered, patient is agreeable to proceed. Consent signed and in chart.  Thank you for this interesting consult.  I greatly enjoyed meeting RAPHAEL FITZPATRICK and look forward to participating in their care.  A copy of this report was sent to the requesting provider on this date.  Electronically Signed: Murrell Redden, PA-C 08/18/2017, 9:50 AM   I spent a total of  30 Minutes in face to face in clinical consultation, greater than 50% of which was counseling/coordinating care for lung biopsy.

## 2017-08-19 DIAGNOSIS — C801 Malignant (primary) neoplasm, unspecified: Secondary | ICD-10-CM | POA: Diagnosis not present

## 2017-08-22 ENCOUNTER — Encounter (HOSPITAL_COMMUNITY): Payer: Self-pay | Admitting: Emergency Medicine

## 2017-08-24 ENCOUNTER — Encounter (INDEPENDENT_AMBULATORY_CARE_PROVIDER_SITE_OTHER): Payer: Self-pay | Admitting: *Deleted

## 2017-08-25 ENCOUNTER — Other Ambulatory Visit: Payer: Self-pay | Admitting: *Deleted

## 2017-08-25 DIAGNOSIS — Z79899 Other long term (current) drug therapy: Secondary | ICD-10-CM | POA: Diagnosis not present

## 2017-08-25 DIAGNOSIS — E1129 Type 2 diabetes mellitus with other diabetic kidney complication: Secondary | ICD-10-CM | POA: Diagnosis not present

## 2017-08-25 DIAGNOSIS — N183 Chronic kidney disease, stage 3 (moderate): Secondary | ICD-10-CM | POA: Diagnosis not present

## 2017-08-25 DIAGNOSIS — I2510 Atherosclerotic heart disease of native coronary artery without angina pectoris: Secondary | ICD-10-CM | POA: Diagnosis not present

## 2017-08-26 ENCOUNTER — Inpatient Hospital Stay (HOSPITAL_COMMUNITY): Payer: Medicare Other | Attending: Hematology | Admitting: Hematology

## 2017-08-26 ENCOUNTER — Encounter (HOSPITAL_COMMUNITY): Payer: Self-pay | Admitting: Hematology

## 2017-08-26 DIAGNOSIS — Z77090 Contact with and (suspected) exposure to asbestos: Secondary | ICD-10-CM | POA: Insufficient documentation

## 2017-08-26 DIAGNOSIS — N189 Chronic kidney disease, unspecified: Secondary | ICD-10-CM | POA: Diagnosis not present

## 2017-08-26 DIAGNOSIS — R918 Other nonspecific abnormal finding of lung field: Secondary | ICD-10-CM | POA: Insufficient documentation

## 2017-08-26 DIAGNOSIS — E1122 Type 2 diabetes mellitus with diabetic chronic kidney disease: Secondary | ICD-10-CM | POA: Insufficient documentation

## 2017-08-26 DIAGNOSIS — R5383 Other fatigue: Secondary | ICD-10-CM | POA: Insufficient documentation

## 2017-08-26 DIAGNOSIS — C3431 Malignant neoplasm of lower lobe, right bronchus or lung: Secondary | ICD-10-CM | POA: Diagnosis not present

## 2017-08-26 DIAGNOSIS — I129 Hypertensive chronic kidney disease with stage 1 through stage 4 chronic kidney disease, or unspecified chronic kidney disease: Secondary | ICD-10-CM | POA: Diagnosis not present

## 2017-08-26 DIAGNOSIS — R0602 Shortness of breath: Secondary | ICD-10-CM | POA: Insufficient documentation

## 2017-08-26 DIAGNOSIS — C801 Malignant (primary) neoplasm, unspecified: Secondary | ICD-10-CM | POA: Diagnosis not present

## 2017-08-26 DIAGNOSIS — Z87891 Personal history of nicotine dependence: Secondary | ICD-10-CM | POA: Insufficient documentation

## 2017-08-26 NOTE — Progress Notes (Signed)
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  HISTORY OF PRESENTING ILLNESS:  [name] 77 y.o. male is seen in consultation today for further workup and management of bilateral lung nodules.  He had a chest x-ray done for shortness of breath which showed multiple bilateral lung nodules.  PET/CT scan on 08/02/2017 confirmed multiple PET avid subcentimeter lung nodules, largest measuring 1.2 cm in the left middle lobe.  CT-guided biopsy was arranged on 08/18/2016 of the right lower lobe lung nodule.  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  PET CT scan also shows showed nonspecific uptake in the bowels with no CT correlate.  The patient is an ex-smoker who quit smoking 25 years ago, 1 and1/2 packs for a day for 30 years.  He also had minor work-related asbestos exposure.  His last colonoscopy was in 2007 which was within normal limits.  He denies any cough or hemoptysis.  He denies any fevers, night sweats or weight loss.  He denies any bleeding per rectum or melena.  No change in bowel habits was reported.  No headaches or vision changes.  He was able to do yard work until few weeks ago.  He is getting tired easily.  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  He lives at home with his wife.  MEDICAL HISTORY:  Past Medical History:  Diagnosis Date  . Chronic kidney disease   . Coronary artery disease   . Diabetes mellitus   . Hypertension   . 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 Spontaneous pneumothorax 09/15/2010   right    SURGICAL HISTORY: Past Surgical History:  Procedure Laterality Date  . ANKLE FRACTURE  SURGERY  2008   [facility]  . APPENDECTOMY  1960's  . CHEST TUBE INSERTION Right 09/15/2010   Dr Servando Snare - spontaneous PTX  . CHOLECYSTECTOMY  2008  . CORONARY ARTERY BYPASS GRAFT  2003  . EYE SURGERY  2001   Cataracts removed bilaterally  . INCISIONAL HERNIA REPAIR N/A 01/29/2015   Procedure: Fatima Blank HERNIORRHAPHY WITH MESH;  Surgeon: Aviva Signs, MD;  Location: [facility];  Service: General;  Laterality: N/A;  . INSERTION OF MESH N/A 01/29/2015   Procedure: INSERTION OF MESH;  Surgeon: Aviva Signs, MD;  Location: [facility];  Service: General;  Laterality: N/A;  . [place]  . LEFT HEART CATH AND CORS/GRAFTS ANGIOGRAPHY N/A 06/20/2017   Procedure: LEFT HEART CATH AND CORS/GRAFTS ANGIOGRAPHY;  Surgeon: Martinique, Peter M, MD;  Location: [facility];  Service: Cardiovascular;  Laterality: N/A;  . ORIF ANKLE FRACTURE  08/26/2011   Procedure: OPEN REDUCTION INTERNAL FIXATION (ORIF) ANKLE FRACTURE;  Surgeon: Sanjuana Kava, MD;  Location: [facility];  Service: Orthopedics;  Laterality: Right;  . PROSTATE SURGERY  2012   Enlarged prostate  . TRANSURETHRAL RESECTION OF PROSTATE  2012    SOCIAL HISTORY: Social History   Socioeconomic History  . Marital status: Married    Spouse name: Not on file  . Number of children: Not on file  . Years of education: Not on file  .  Highest education level: Not on file  Occupational History  . Not on file  Social Needs  . Financial resource strain: Not on file  . Food insecurity:    Worry: Not on file    Inability: Not on file  . Transportation needs:    Medical: Not on file    Non-medical: Not on file  Tobacco Use  . Smoking status: Former Smoker    Packs/day: 1.00    Years: 33.00    Pack years: 33.00    Last attempt to quit: 05/17/1987    Years since quitting: 30.2  . Smokeless tobacco: Never Used  Substance and Sexual Activity  . Alcohol use: No  . Drug use: No  . Sexual  activity: Yes  Lifestyle  . Physical activity:    Days per week: Not on file    Minutes per session: Not on file  . Stress: Not on file  Relationships  . Social connections:    Talks on phone: Not on file    Gets together: Not on file    Attends religious service: Not on file    Active member of club or organization: Not on file    Attends meetings of clubs or organizations: Not on file    Relationship status: Not on file  . Intimate partner violence:    Fear of current or ex partner: Not on file    Emotionally abused: Not on file    Physically abused: Not on file    Forced sexual activity: Not on file  Other Topics Concern  . Not on file  Social History Narrative  . Not on file    FAMILY HISTORY: Family History  Problem Relation Age of Onset  . Aneurysm Mother   . Heart attack Father     ALLERGIES:  has No Known Allergies.  MEDICATIONS:  Current Outpatient Medications  Medication Sig Dispense Refill  . aspirin EC 81 MG tablet Take 81 mg by mouth daily.    Marland Kitchen atorvastatin (LIPITOR) 40 MG tablet Take 1 tablet (40 mg total) by mouth daily. 30 tablet 6  . glimepiride (AMARYL) 2 MG tablet Take 2 mg by mouth daily with breakfast.     . isosorbide mononitrate (IMDUR) 30 MG 24 hr tablet Take 1 tablet (30 mg total) by mouth daily. 90 tablet 3  . metFORMIN (GLUCOPHAGE) 500 MG tablet Take 1 tablet (500 mg total) by mouth 2 (two) times daily with a meal.    . metoprolol (LOPRESSOR) 100 MG tablet Take 100 mg by mouth 2 (two) times daily.    . nitroGLYCERIN (NITROSTAT) 0.4 MG SL tablet Place 0.4 mg under the tongue every 5 (five) minutes as needed for chest pain.     . ONE TOUCH ULTRA TEST test strip     . pioglitazone (ACTOS) 45 MG tablet Take 45 mg by mouth daily.     No current facility-administered medications for this visit.     REVIEW OF SYSTEMS:   Constitutional: Denies fevers, chills or abnormal night sweats Eyes: Denies blurriness of vision, double vision or watery  eyes Ears, nose, mouth, throat, and face: Denies mucositis or sore throat Respiratory: Denies cough, dyspnea or wheezes Cardiovascular: Denies palpitation, chest discomfort or lower extremity swelling Gastrointestinal:  Denies nausea, heartburn or change in bowel habits Skin: Denies abnormal skin rashes Lymphatics: Denies new lymphadenopathy or easy bruising Neurological:Denies numbness, tingling or new weaknesses Behavioral/Psych: Mood is stable, no new changes  All other systems were reviewed with the  patient and are negative.  PHYSICAL EXAMINATION: ECOG PERFORMANCE STATUS: 1 - Symptomatic but completely ambulatory  I have reviewed his vitals. GENERAL:alert, no distress and comfortable SKIN: skin color, texture, turgor are normal, no rashes or significant lesions.  Median sternotomy scar present. EYES: normal, conjunctiva are pink and non-injected, sclera clear OROPHARYNX:no exudate, no erythema and lips, buccal mucosa, and tongue normal  NECK: supple, thyroid normal size, non-tender, without nodularity LYMPH:  no palpable lymphadenopathy in the cervical, axillary or inguinal LUNGS: clear to auscultation and percussion with normal breathing effort HEART: regular rate & rhythm and no murmurs and no lower extremity edema ABDOMEN:abdomen soft, non-tender and normal bowel sounds Musculoskeletal:no cyanosis of digits and no clubbing  PSYCH: alert & oriented x 3 with fluent speech NEURO: no focal motor/sensory deficits  LABORATORY DATA:  I have reviewed the data as listed Lab Results  Component Value Date   WBC 8.7 08/18/2017   HGB 13.8 08/18/2017   HCT 41.4 08/18/2017   MCV 82.6 08/18/2017   PLT 191 08/18/2017     Chemistry      Component Value Date/Time   NA 133 (L) 06/17/2017 0951   K 4.8 06/17/2017 0951   CL 97 (L) 06/17/2017 0951   CO2 27 06/17/2017 0951   BUN 24 (H) 06/17/2017 0951   CREATININE 1.61 (H) 06/17/2017 0951      Component Value Date/Time   CALCIUM 9.4  06/17/2017 0951   ALKPHOS 85 09/09/2010 1008   AST 21 09/09/2010 1008   ALT 10 09/09/2010 1008   BILITOT 0.6 09/09/2010 1008       RADIOGRAPHIC STUDIES: I have personally reviewed the radiological images as listed and agreed with the findings in the report. Nm Pet Image Initial (pi) Skull Base To Thigh  Result Date: 08/02/2017 CLINICAL DATA:  Initial treatment strategy for pulmonary nodules. EXAM: NUCLEAR MEDICINE PET SKULL BASE TO THIGH TECHNIQUE: 10.9 mCi F-18 FDG was injected intravenously. Full-ring PET imaging was performed from the skull base to thigh after the radiotracer. CT data was obtained and used for attenuation correction and anatomic localization. Fasting blood glucose: 149 mg/dl Mediastinal blood pool activity: SUV max 3.2 COMPARISON:  Chest radiograph 06/17/2017 FINDINGS: NECK: No significant abnormal hypermetabolic findings in the neck. Incidental CT findings: 2.0 cm in long axis left thyroid nodule is not hypermetabolic and is likely benign. Bilateral carotid atherosclerotic calcifications. CHEST: Numerous scattered pulmonary nodules throughout both lungs. Random distribution. All lobes are involved. An index 1.3 by 1.0 cm nodule in the left upper lobe on image 102/3 has a maximum SUV of 4.0. Many of the nodules are technically too small to characterize although for the most part appear likely to be associated with accentuated metabolic activity. No appreciable pathologic or hypermetabolic adenopathy. Incidental CT findings: Coronary, aortic arch, and branch vessel atherosclerotic vascular disease. Mild cardiomegaly. Prior CABG. Centrilobular emphysema. ABDOMEN/PELVIS: Scattered regions of accentuated metabolic activity in the bowel, likely physiologic given the lack of correlate of CT abnormalities. Incidental CT findings: Cholecystectomy. Aortoiliac atherosclerotic vascular disease. Probable prior TURP. Slight lobularity of the left kidney upper pole posteriorly is technically  nonspecific. SKELETON: No hypermetabolic bony findings. Incidental CT findings: Unremarkable IMPRESSION: 1. Innumerable randomly distributed pulmonary nodules with weak metabolic activity. The largest of these measures 1.3 by 1.0 cm in the left upper lobe and has a maximum SUV of 4.0 (background blood pool activity is 3.2). No separate hypermetabolic primary tumor is identified. Possibilities might include miliary infection from tuberculosis or fungal disease; or  low-grade metastatic disease from unknown primary. If the patient does not have clinical symptoms of infection then biopsy may be warranted. 2. Accentuated activity throughout much of the bowel. This is probably physiologic given the lack of correlate of CT findings. The degree of physiologic activity could mask or height underlying bowel neoplasm. No abnormal findings in the liver. 3. Slight nodularity of the upper pole of the left kidney posteriorly, probably from a slightly complex cyst, but technically nonspecific. 4. Aortic Atherosclerosis (ICD10-I70.0) and Emphysema (ICD10-J43.9). Coronary atherosclerosis. Mild cardiomegaly. Prior CABG. Electronically Signed   By: Van Clines M.D.   On: 08/02/2017 08:37   Ct Biopsy  Result Date: 08/18/2017 INDICATION: 77 year old male with a history of multiple lung nodules with unknown primary malignancy EXAM: CT BIOPSY MEDICATIONS: None. ANESTHESIA/SEDATION: Moderate (conscious) sedation was employed during this procedure. A total of Versed 1.5 mg and Fentanyl 75 mcg was administered intravenously. Moderate Sedation Time: 15 minutes. The patient's level of consciousness and vital signs were monitored continuously by radiology nursing throughout the procedure under my direct supervision. FLUOROSCOPY TIME:  CT COMPLICATIONS: None PROCEDURE: The procedure, risks, benefits, and alternatives were explained to the patient and the patient's family. Specific risks that were addressed included bleeding, infection,  pneumothorax, need for further procedure including chest tube placement, chance of delayed pneumothorax or hemorrhage, hemoptysis, nondiagnostic sample, cardiopulmonary collapse, death. Questions regarding the procedure were encouraged and answered. The patient understands and consents to the procedure. Patient was positioned in the right decubitus position on the CT gantry table and a scout CT of the chest was performed for planning purposes. Once angle of approach was determined, the skin and subcutaneous tissues this scan was prepped and draped in the usual sterile fashion, and a sterile drape was applied covering the operative field. A sterile gown and sterile gloves were used for the procedure. Local anesthesia was provided with 1% Lidocaine. The skin and subcutaneous tissues were infiltrated 1% lidocaine for local anesthesia, and a small stab incision was made with an 11 blade scalpel. Using CT guidance, a 17 gauge trocar needle was advanced into the right lower lobetarget. After confirmation of the tip, separate 18 gauge core biopsies were performed. These were placed into solution for transportation to the lab. Biosentry Device was deployed. A final CT image was performed. Patient tolerated the procedure well and remained hemodynamically stable throughout. No complications were encountered and no significant blood loss was encounter IMPRESSION: Status post CT-guided biopsy of right lower lobe lung nodule. Tissue specimen sent to pathology for complete histopathologic analysis. Signed, Dulcy Fanny. Earleen Newport, DO [facility] Electronically Signed   By: Corrie Mckusick D.O.   On: 08/18/2017 13:21   Dg Chest Port 1 View  Result Date: 08/18/2017 CLINICAL DATA:  Pneumothorax after biopsy. EXAM: PORTABLE CHEST 1 VIEW COMPARISON:  CT biopsy 08/18/2017, earlier same day. Chest x-ray 06/17/2017. FINDINGS: 1319 hours. 70 upright AP film shows no definite pleural line  to indicate underlying pneumothorax. Lung volumes are low. Bilateral pulmonary nodules are evident. The cardio pericardial silhouette is enlarged. Status post CABG The visualized bony structures of the thorax are intact. IMPRESSION: No definite findings of pneumothorax after right lower lobe lung biopsy. Electronically Signed   By: Misty Stanley M.D.   On: 08/18/2017 13:54    ASSESSMENT & PLAN:  Adenocarcinoma of unknown primary ([place]) 1.  Well-differentiated adenocarcinoma: - Chest x-ray done for shortness of breath showed multiple bilateral lung nodules. -PET/CT scan on 08/02/2017 confirmed  multiple PET avid subcentimeter lung nodules, largest measuring 1.2 cm in the left middle lobe -CT-guided biopsy on 08/18/2016 of the right lower lobe lung nodule, consistent with well-differentiated adenocarcinoma - Ex-smoker, quit 25 years ago, 1-1 and half pack per day for 30 years, had some work-related asbestos exposure. -Last colonoscopy in 2007 by Dr. Buford Dresser, within normal limits, PET/CT scan shows nonspecific uptake in the bowels with no CT correlate - I have talked to the pathologist and requested a limited immuno histochemistry panel to further identify this tumor.  I have also recommended colonoscopy as soon as possible and biopsy of any suspicious lesions.  We will also order MRI of the brain to complete the staging process.  The differential diagnosis includes primary lung cancer versus metastatic disease given multiple small bilateral lung nodules.  We will see him back after the completion of above-mentioned tests.  Orders Placed This Encounter  Procedures  . MR Brain W Wo Contrast    Order Specific Question:   If indicated for the ordered procedure, I authorize the administration of contrast media per Radiology protocol    Answer:   Yes    Order Specific Question:   What is the patient's sedation requirement?    Answer:   No Sedation    Order Specific Question:   Does the patient have a pacemaker  or implanted devices?    Answer:   No    Order Specific Question:   Radiology Contrast Protocol - do NOT remove file path    Answer:   [email or web address]    Order Specific Question:   Reason for Exam additional comments    Answer:   lung cancer work up    Order Specific Question:   Preferred imaging location?    Answer:   [facility] (table limit-350lbs)    All questions were answered. The patient knows to call the clinic with any problems, questions or concerns.     Derek Jack, MD 08/26/2017 4:47 PM

## 2017-08-26 NOTE — Assessment & Plan Note (Addendum)
1.  Well-differentiated adenocarcinoma: - Chest x-ray done for shortness of breath showed multiple bilateral lung nodules. -PET/CT scan on 08/02/2017 confirmed multiple PET avid subcentimeter lung nodules, largest measuring 1.2 cm in the left middle lobe -CT-guided biopsy on 08/18/2016 of the right lower lobe lung nodule, consistent with well-differentiated adenocarcinoma - Ex-smoker, quit 25 years ago, 1-1 and half pack per day for 30 years, had some work-related asbestos exposure. -Last colonoscopy in 2007 by Dr. Buford Dresser, within normal limits, PET/CT scan shows nonspecific uptake in the bowels with no CT correlate - I have talked to the pathologist and requested a limited immuno histochemistry panel to further identify this tumor.  I have also recommended colonoscopy as soon as possible and biopsy of any suspicious lesions.  We will also order MRI of the brain to complete the staging process.  The differential diagnosis includes primary lung cancer versus metastatic disease given multiple small bilateral lung nodules.  We will see him back after the completion of above-mentioned tests.

## 2017-08-26 NOTE — Patient Instructions (Signed)
[facility]  Discharge Instructions:  You were seen by Dr. Katragadda today.   _______________________________________________________________  Thank you for choosing [facility] to provide your oncology and hematology care.  To afford each patient quality time with our providers, please arrive at least 15 minutes before your scheduled appointment.  You need to re-schedule your appointment if you arrive 10 or more minutes late.  We strive to give you quality time with our providers, and arriving late affects you and other patients whose appointments are after yours.  Also, if you no show three or more times for appointments you may be dismissed from the clinic.  Again, thank you for choosing [facility]. Our hope is that these requests will allow you access to exceptional care and in a timely manner. _______________________________________________________________  If you have questions after your visit, please contact our office at [phone] between the hours of 8:30 a.m. and 5:00 p.m. Voicemails left after 4:30 p.m. will not be returned until the following business day. _______________________________________________________________  For prescription refill requests, have your pharmacy contact our office. _______________________________________________________________  Recommendations made by the consultant and any test results will be sent to your referring physician. _______________________________________________________________ 

## 2017-08-31 DIAGNOSIS — N183 Chronic kidney disease, stage 3 (moderate): Secondary | ICD-10-CM | POA: Diagnosis not present

## 2017-08-31 DIAGNOSIS — E1122 Type 2 diabetes mellitus with diabetic chronic kidney disease: Secondary | ICD-10-CM | POA: Diagnosis not present

## 2017-08-31 DIAGNOSIS — C801 Malignant (primary) neoplasm, unspecified: Secondary | ICD-10-CM | POA: Diagnosis not present

## 2017-08-31 DIAGNOSIS — Z6821 Body mass index (BMI) 21.0-21.9, adult: Secondary | ICD-10-CM | POA: Diagnosis not present

## 2017-09-01 ENCOUNTER — Ambulatory Visit (HOSPITAL_COMMUNITY)
Admission: RE | Admit: 2017-09-01 | Discharge: 2017-09-01 | Disposition: A | Discharge status: Home / Self Care | Payer: Medicare Other | Source: Ambulatory Visit | Attending: Hematology | Admitting: Hematology

## 2017-09-01 DIAGNOSIS — G319 Degenerative disease of nervous system, unspecified: Secondary | ICD-10-CM | POA: Diagnosis not present

## 2017-09-01 DIAGNOSIS — I6782 Cerebral ischemia: Secondary | ICD-10-CM | POA: Insufficient documentation

## 2017-09-01 DIAGNOSIS — C3490 Malignant neoplasm of unspecified part of unspecified bronchus or lung: Secondary | ICD-10-CM | POA: Diagnosis not present

## 2017-09-01 DIAGNOSIS — C801 Malignant (primary) neoplasm, unspecified: Secondary | ICD-10-CM | POA: Diagnosis present

## 2017-09-01 LAB — POCT I-STAT CREATININE: CREATININE: 1.8 mg/dL — AB (ref 0.61–1.24)

## 2017-09-01 MED ORDER — GADOBENATE DIMEGLUMINE 529 MG/ML IV SOLN
20.0000 mL | Freq: Once | INTRAVENOUS | Status: AC | PRN
  Administered 2017-09-01: 10 mL via INTRAVENOUS

## 2017-09-05 ENCOUNTER — Ambulatory Visit: Payer: Medicare Other | Admitting: Thoracic Surgery (Cardiothoracic Vascular Surgery)

## 2017-09-05 ENCOUNTER — Other Ambulatory Visit: Payer: Self-pay | Admitting: *Deleted

## 2017-09-05 ENCOUNTER — Encounter: Payer: Self-pay | Admitting: *Deleted

## 2017-09-05 ENCOUNTER — Telehealth: Payer: Self-pay | Admitting: *Deleted

## 2017-09-05 ENCOUNTER — Encounter: Payer: Self-pay | Admitting: Thoracic Surgery (Cardiothoracic Vascular Surgery)

## 2017-09-05 ENCOUNTER — Encounter: Payer: Self-pay | Admitting: Nurse Practitioner

## 2017-09-05 ENCOUNTER — Ambulatory Visit: Payer: Medicare Other | Admitting: Nurse Practitioner

## 2017-09-05 VITALS — BP 145/75 | HR 52 | Resp 20 | Ht 70.0 in | Wt 197.0 lb

## 2017-09-05 DIAGNOSIS — Z951 Presence of aortocoronary bypass graft: Secondary | ICD-10-CM

## 2017-09-05 DIAGNOSIS — I2510 Atherosclerotic heart disease of native coronary artery without angina pectoris: Secondary | ICD-10-CM | POA: Diagnosis not present

## 2017-09-05 DIAGNOSIS — R918 Other nonspecific abnormal finding of lung field: Secondary | ICD-10-CM

## 2017-09-05 DIAGNOSIS — R935 Abnormal findings on diagnostic imaging of other abdominal regions, including retroperitoneum: Secondary | ICD-10-CM

## 2017-09-05 DIAGNOSIS — I209 Angina pectoris, unspecified: Secondary | ICD-10-CM

## 2017-09-05 NOTE — Progress Notes (Signed)
[address]       [place] [address]             [phone]     CARDIOTHORACIC SURGERY OFFICE NOTE  Referring Provider is Martinique, Peter M, MD  Primary Cardiologist is Herminio Commons, MD PCP is [name], MD   HPI:  Patient is a 77 year old male with known history of coronary artery disease status post coronary artery bypass grafting x4 in 2003, hypertension, type 2 diabetes mellitus with peripheral neuropathy, and stage III chronic kidney disease who returns to the office today for follow-up of severe three-vessel coronary artery disease and vein graft disease status post coronary artery bypass grafting in the remote past.  He was originally seen in consultation on August 08, 2017.  At that time the patient had just undergone a PET CT scan which revealed widespread bilateral pulmonary nodules associated with increased metabolic activity, worrisome for possible metastatic cancer.  Since then the patient underwent CT-guided needle biopsy of a nodule in the right lower lobe which yielded malignant cells consistent with adenocarcinoma of unknown primary.  The patient was evaluated by Dr. Delton Coombes in the cancer center and has been scheduled for colonoscopy later this week to rule out the possibility of colon cancer primary.  He returns to our office today and reports that overall he is doing acceptably well.  He states that he has not had much trouble with chest pain or chest tightness over the last few weeks.  He has not taken a nitroglycerin tablet in over 3 weeks.  He still gets some occasional episodes of substernal chest tightness with activity, but usually symptoms resolve fairly quickly if he stops for rest or takes deep breaths.  The remainder of his review of systems is unremarkable and unchanged from previously.   Current Outpatient Medications  Medication Sig Dispense Refill  . aspirin EC 81 MG tablet Take 81 mg by mouth daily.    Marland Kitchen glimepiride (AMARYL) 2  MG tablet Take 2 mg by mouth daily with breakfast.     . isosorbide mononitrate (IMDUR) 30 MG 24 hr tablet Take 1 tablet (30 mg total) by mouth daily. 90 tablet 3  . metFORMIN (GLUCOPHAGE) 500 MG tablet Take 1 tablet (500 mg total) by mouth 2 (two) times daily with a meal.    . metoprolol (LOPRESSOR) 100 MG tablet Take 100 mg by mouth 2 (two) times daily.    . nitroGLYCERIN (NITROSTAT) 0.4 MG SL tablet Place 0.4 mg under the tongue every 5 (five) minutes as needed for chest pain.     . ONE TOUCH ULTRA TEST test strip     . pioglitazone (ACTOS) 45 MG tablet Take 45 mg by mouth daily.    . sitaGLIPtin (JANUVIA) 50 MG tablet Take 50 mg by mouth daily.     No current facility-administered medications for this visit.       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  Under the circumstances I do not feel the patient should be considered a candidate for redo coronary artery bypass grafting.  The patient understands and has no interest in surgical intervention for management of his ischemic heart disease.   Plan:  We have not recommended any changes the patient's current medications.  The patient has been instructed to follow-up with Dr. Bronson Ing as soon as practical.  All of his questions have been addressed.  In the future he will call and return to see [place] only should specific problems or questions arise.  I spent in excess of 15 minutes during the conduct of this office consultation and >50% of this time involved direct  face-to-face encounter with the patient for counseling and/or coordination of their care.    Valentina Gu. Roxy Manns, MD 09/05/2017 4:53 PM

## 2017-09-05 NOTE — Patient Instructions (Signed)
1. We will schedule your colonoscopy for you. 2. We will request your previous colonoscopy from any pain hospital to have it scanned into our electronic system. 3. Further recommendations will be made after your colonoscopy. 4. Return for follow-up based on recommendations made after your colonoscopy. 5. Follow-up with oncology based on their recommendations. 6. Call us if you have any questions or concerns.   It was great to meet you both today!  I hope you have a wonderful Summer!!!    At [facility] we value your feedback. You may receive a survey about your visit today. Please share your experience as we strive to create trusting relationships with our patients to provide genuine, compassionate, quality care.

## 2017-09-05 NOTE — Patient Instructions (Signed)
Continue all previous medications without any changes at this time  Use nitroglycerin as needed for chest pain and contact your cardiologist or go to ED if you develop chest pain unrelieved by nitroglycerin

## 2017-09-05 NOTE — Progress Notes (Signed)
Primary Care Physician:  [name], MD Primary Gastroenterologist:  Dr. Gala Romney  Chief Complaint  Patient presents with  . Colonoscopy    needs tcs prior to lung cancer tx; last tcs approx 12 yrs ago    HPI:   [name] is a 77 y.o. male who presents on referral from oncology for urgent colonoscopy.  Patient was seen by oncology for 05/05/2018 for workup and management of bilateral lung nodules.  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  Unfortunately, PET/CT scan also showed nonspecific uptake in the bowels with no CT correlate.  Last colonoscopy 2007 which was normal, per oncology note.  No change in bowel habits noted.  Recommended colonoscopy as soon as possible and biopsy of any suspicious lesions.  Further workup being undertaken by oncology.  We have been asked to arrange his colonoscopy.  Today he states he's doing well today. He is accompanied by his wife. He has previous exposure to asbestos. Denies abdominal pain, N/V, hematochezia, melena, fever, chills, unintentional weight loss. Last colonoscopy about 12 years ago at [facility] (not in the EMR) and was told everything looked good. Occasional chest pain and follows with cardiology and CVTS (previous CABG in 2003). Denies dyspnea, dizziness, lightheadedness, syncope, near syncope. Denies any other upper or lower GI symptoms.  Past Medical History:  Diagnosis Date  . Chronic kidney disease   . Coronary artery disease   . Diabetes mellitus   . Hypertension   . 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 Spontaneous pneumothorax 09/15/2010   right    Past Surgical History:  Procedure Laterality Date  . ANKLE FRACTURE SURGERY  2008   [facility]  . APPENDECTOMY  1960's  . CHEST TUBE INSERTION Right 09/15/2010   Dr Servando Snare - spontaneous  PTX  . CHOLECYSTECTOMY  2008  . CORONARY ARTERY BYPASS GRAFT  2003  . EYE SURGERY  2001   Cataracts removed bilaterally  . INCISIONAL HERNIA REPAIR N/A 01/29/2015   Procedure: Fatima Blank HERNIORRHAPHY WITH MESH;  Surgeon: Aviva Signs, MD;  Location: [facility];  Service: General;  Laterality: N/A;  . INSERTION OF MESH N/A 01/29/2015   Procedure: INSERTION OF MESH;  Surgeon: Aviva Signs, MD;  Location: [facility];  Service: General;  Laterality: N/A;  . [place]  . LEFT HEART CATH AND CORS/GRAFTS ANGIOGRAPHY N/A 06/20/2017   Procedure: LEFT HEART CATH AND CORS/GRAFTS ANGIOGRAPHY;  Surgeon: Martinique, Peter M, MD;  Location: [facility];  Service: Cardiovascular;  Laterality: N/A;  . ORIF ANKLE FRACTURE  08/26/2011   Procedure: OPEN REDUCTION INTERNAL FIXATION (ORIF) ANKLE FRACTURE;  Surgeon: Sanjuana Kava, MD;  Location: [facility];  Service: Orthopedics;  Laterality: Right;  . PROSTATE SURGERY  2012   Enlarged prostate  . TRANSURETHRAL RESECTION OF PROSTATE  2012    Current Outpatient Medications  Medication Sig Dispense Refill  . aspirin EC 81 MG tablet Take 81 mg by mouth daily.    Marland Kitchen glimepiride (AMARYL) 2 MG tablet Take 2 mg by mouth daily with breakfast.     . isosorbide mononitrate (IMDUR) 30 MG 24 hr tablet Take 1 tablet (30 mg total) by mouth daily. 90 tablet 3  . metFORMIN (GLUCOPHAGE) 500 MG tablet Take 1 tablet (500 mg total) by  mouth 2 (two) times daily with a meal.    . metoprolol (LOPRESSOR) 100 MG tablet Take 100 mg by mouth 2 (two) times daily.    . nitroGLYCERIN (NITROSTAT) 0.4 MG SL tablet Place 0.4 mg under the tongue every 5 (five) minutes as needed for chest pain.     . ONE TOUCH ULTRA TEST test strip     . pioglitazone (ACTOS) 45 MG tablet Take 45 mg by mouth daily.    . sitaGLIPtin (JANUVIA) 50 MG tablet Take 50 mg by mouth daily.     No current facility-administered medications for this visit.     Allergies as of 09/05/2017  . (No  Known Allergies)    Family History  Problem Relation Age of Onset  . Aneurysm Mother   . Heart attack Father   . Colon cancer Maternal Uncle   . Gastric cancer Neg Hx   . Esophageal cancer Neg Hx     Social History   Socioeconomic History  . Marital status: Married    Spouse name: Not on file  . Number of children: Not on file  . Years of education: Not on file  . Highest education level: Not on file  Occupational History  . Not on file  Social Needs  . Financial resource strain: Not on file  . Food insecurity:    Worry: Not on file    Inability: Not on file  . Transportation needs:    Medical: Not on file    Non-medical: Not on file  Tobacco Use  . Smoking status: Former Smoker    Packs/day: 1.00    Years: 33.00    Pack years: 33.00    Last attempt to quit: 05/17/1987    Years since quitting: 30.3  . Smokeless tobacco: Never Used  Substance and Sexual Activity  . Alcohol use: No  . Drug use: No  . Sexual activity: Yes  Lifestyle  . Physical activity:    Days per week: Not on file    Minutes per session: Not on file  . Stress: Not on file  Relationships  . Social connections:    Talks on phone: Not on file    Gets together: Not on file    Attends religious service: Not on file    Active member of club or organization: Not on file    Attends meetings of clubs or organizations: Not on file    Relationship status: Not on file  . Intimate partner violence:    Fear of current or ex partner: Not on file    Emotionally abused: Not on file    Physically abused: Not on file    Forced sexual activity: Not on file  Other Topics Concern  . Not on file  Social History Narrative  . Not on file    Review of Systems: General: Negative for anorexia, weight loss, fever, chills, fatigue, weakness. ENT: Negative for hoarseness, difficulty swallowing. CV: Negative for chest pain, angina, palpitations, peripheral edema.  Respiratory: Negative for dyspnea at rest,  cough, sputum, wheezing.  GI: See history of present illness. MS: Negative for joint pain, low back pain.  Derm: Negative for rash or itching.  Endo: Negative for unusual weight change.  Heme: Negative for bruising or bleeding. Allergy: Negative for rash or hives.    Physical Exam: BP (!) 158/76   Pulse (!) 53   Temp (!) 96.4 F (35.8 C) (Oral)   Ht 5\' 10"  (1.778 m)   Wt 198 lb  9.6 oz (90.1 kg)   BMI 28.50 kg/m  General:   Alert and oriented. Pleasant and cooperative. Well-nourished and well-developed.  Eyes:  Without icterus, sclera clear and conjunctiva pink.  Ears:  Normal auditory acuity. Cardiovascular:  S1, S2 present without murmurs appreciated. Extremities without clubbing or edema. Respiratory:  Clear to auscultation bilaterally. No wheezes, rales, or rhonchi. No distress.  Gastrointestinal:  +BS, soft, non-tender and non-distended. No HSM noted. No guarding or rebound. No masses appreciated.  Rectal:  Deferred  Musculoskalatal:  Symmetrical without gross deformities. Neurologic:  Alert and oriented x4;  grossly normal neurologically. Psych:  Alert and cooperative. Normal mood and affect. Heme/Lymph/Immune: No excessive bruising noted.    09/05/2017 11:30 AM   Disclaimer: This note was dictated with voice recognition software. Similar sounding words can inadvertently be transcribed and may not be corrected upon review.

## 2017-09-05 NOTE — Progress Notes (Signed)
cc'ed to pcp °

## 2017-09-05 NOTE — Assessment & Plan Note (Signed)
The patient has been seen by oncology and noted to have abnormal lymph nodes in his chest which were proven to be well-differentiated adenocarcinoma on biopsy.  PET CT scan found concerns for hypermetabolic area in the bowel/colon.  Recommended colonoscopy as soon as possible in order to better plan cancer treatment.  Overall the patient is asymptomatic from a GI standpoint.  Last colonoscopy 12 years ago at any pen which is not in our EMR.  I will request records to be scanned to EMR.  We will plan for colonoscopy ASAP in an attempt to not delay his treatment.  Return for follow-up based on post procedure recommendations.

## 2017-09-05 NOTE — Telephone Encounter (Signed)
Spoke with EG regarding pt diabetic med instructions for procedure: 1/2 dose night before, none the day of.

## 2017-09-05 NOTE — H&P (View-Only) (Signed)
Primary Care Physician:  [name], MD Primary Gastroenterologist:  Dr. Gala Romney  Chief Complaint  Patient presents with  . Colonoscopy    needs tcs prior to lung cancer tx; last tcs approx 12 yrs ago    HPI:   [name] is a 76 y.o. male who presents on referral from oncology for urgent colonoscopy.  Patient was seen by oncology for 05/05/2018 for workup and management of bilateral lung nodules.  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  Unfortunately, PET/CT scan also showed nonspecific uptake in the bowels with no CT correlate.  Last colonoscopy 2007 which was normal, per oncology note.  No change in bowel habits noted.  Recommended colonoscopy as soon as possible and biopsy of any suspicious lesions.  Further workup being undertaken by oncology.  We have been asked to arrange his colonoscopy.  Today he states he's doing well today. He is accompanied by his wife. He has previous exposure to asbestos. Denies abdominal pain, N/V, hematochezia, melena, fever, chills, unintentional weight loss. Last colonoscopy about 12 years ago at [facility] (not in the EMR) and was told everything looked good. Occasional chest pain and follows with cardiology and CVTS (previous CABG in 2003). Denies dyspnea, dizziness, lightheadedness, syncope, near syncope. Denies any other upper or lower GI symptoms.  Past Medical History:  Diagnosis Date  . Chronic kidney disease   . Coronary artery disease   . Diabetes mellitus   . Hypertension   . 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 Spontaneous pneumothorax 09/15/2010   right    Past Surgical History:  Procedure Laterality Date  . ANKLE FRACTURE SURGERY  2008   [facility]  . APPENDECTOMY  1960's  . CHEST TUBE INSERTION Right 09/15/2010   Dr Servando Snare - spontaneous  PTX  . CHOLECYSTECTOMY  2008  . CORONARY ARTERY BYPASS GRAFT  2003  . EYE SURGERY  2001   Cataracts removed bilaterally  . INCISIONAL HERNIA REPAIR N/A 01/29/2015   Procedure: Fatima Blank HERNIORRHAPHY WITH MESH;  Surgeon: Aviva Signs, MD;  Location: [facility];  Service: General;  Laterality: N/A;  . INSERTION OF MESH N/A 01/29/2015   Procedure: INSERTION OF MESH;  Surgeon: Aviva Signs, MD;  Location: [facility];  Service: General;  Laterality: N/A;  . [place]  . LEFT HEART CATH AND CORS/GRAFTS ANGIOGRAPHY N/A 06/20/2017   Procedure: LEFT HEART CATH AND CORS/GRAFTS ANGIOGRAPHY;  Surgeon: Martinique, Peter M, MD;  Location: [place];  Service: Cardiovascular;  Laterality: N/A;  . ORIF ANKLE FRACTURE  08/26/2011   Procedure: OPEN REDUCTION INTERNAL FIXATION (ORIF) ANKLE FRACTURE;  Surgeon: Sanjuana Kava, MD;  Location: [facility];  Service: Orthopedics;  Laterality: Right;  . PROSTATE SURGERY  2012   Enlarged prostate  . TRANSURETHRAL RESECTION OF PROSTATE  2012    Current Outpatient Medications  Medication Sig Dispense Refill  . aspirin EC 81 MG tablet Take 81 mg by mouth daily.    Marland Kitchen glimepiride (AMARYL) 2 MG tablet Take 2 mg by mouth daily with breakfast.     . isosorbide mononitrate (IMDUR) 30 MG 24 hr tablet Take 1 tablet (30 mg total) by mouth daily. 90 tablet 3  . metFORMIN (GLUCOPHAGE) 500 MG tablet Take 1 tablet (500 mg total) by  mouth 2 (two) times daily with a meal.    . metoprolol (LOPRESSOR) 100 MG tablet Take 100 mg by mouth 2 (two) times daily.    . nitroGLYCERIN (NITROSTAT) 0.4 MG SL tablet Place 0.4 mg under the tongue every 5 (five) minutes as needed for chest pain.     . ONE TOUCH ULTRA TEST test strip     . pioglitazone (ACTOS) 45 MG tablet Take 45 mg by mouth daily.    . sitaGLIPtin (JANUVIA) 50 MG tablet Take 50 mg by mouth daily.     No current facility-administered medications for this visit.     Allergies as of 09/05/2017  . (No  Known Allergies)    Family History  Problem Relation Age of Onset  . Aneurysm Mother   . Heart attack Father   . Colon cancer Maternal Uncle   . Gastric cancer Neg Hx   . Esophageal cancer Neg Hx     Social History   Socioeconomic History  . Marital status: Married    Spouse name: Not on file  . Number of children: Not on file  . Years of education: Not on file  . Highest education level: Not on file  Occupational History  . Not on file  Social Needs  . Financial resource strain: Not on file  . Food insecurity:    Worry: Not on file    Inability: Not on file  . Transportation needs:    Medical: Not on file    Non-medical: Not on file  Tobacco Use  . Smoking status: Former Smoker    Packs/day: 1.00    Years: 33.00    Pack years: 33.00    Last attempt to quit: 05/17/1987    Years since quitting: 30.3  . Smokeless tobacco: Never Used  Substance and Sexual Activity  . Alcohol use: No  . Drug use: No  . Sexual activity: Yes  Lifestyle  . Physical activity:    Days per week: Not on file    Minutes per session: Not on file  . Stress: Not on file  Relationships  . Social connections:    Talks on phone: Not on file    Gets together: Not on file    Attends religious service: Not on file    Active member of club or organization: Not on file    Attends meetings of clubs or organizations: Not on file    Relationship status: Not on file  . Intimate partner violence:    Fear of current or ex partner: Not on file    Emotionally abused: Not on file    Physically abused: Not on file    Forced sexual activity: Not on file  Other Topics Concern  . Not on file  Social History Narrative  . Not on file    Review of Systems: General: Negative for anorexia, weight loss, fever, chills, fatigue, weakness. ENT: Negative for hoarseness, difficulty swallowing. CV: Negative for chest pain, angina, palpitations, peripheral edema.  Respiratory: Negative for dyspnea at rest,  cough, sputum, wheezing.  GI: See history of present illness. MS: Negative for joint pain, low back pain.  Derm: Negative for rash or itching.  Endo: Negative for unusual weight change.  Heme: Negative for bruising or bleeding. Allergy: Negative for rash or hives.    Physical Exam: BP (!) 158/76   Pulse (!) 53   Temp (!) 96.4 F (35.8 C) (Oral)   Ht 5\' 10"  (1.778 m)   Wt 198 lb  9.6 oz (90.1 kg)   BMI 28.50 kg/m  General:   Alert and oriented. Pleasant and cooperative. Well-nourished and well-developed.  Eyes:  Without icterus, sclera clear and conjunctiva pink.  Ears:  Normal auditory acuity. Cardiovascular:  S1, S2 present without murmurs appreciated. Extremities without clubbing or edema. Respiratory:  Clear to auscultation bilaterally. No wheezes, rales, or rhonchi. No distress.  Gastrointestinal:  +BS, soft, non-tender and non-distended. No HSM noted. No guarding or rebound. No masses appreciated.  Rectal:  Deferred  Musculoskalatal:  Symmetrical without gross deformities. Neurologic:  Alert and oriented x4;  grossly normal neurologically. Psych:  Alert and cooperative. Normal mood and affect. Heme/Lymph/Immune: No excessive bruising noted.    09/05/2017 11:30 AM   Disclaimer: This note was dictated with voice recognition software. Similar sounding words can inadvertently be transcribed and may not be corrected upon review.

## 2017-09-07 ENCOUNTER — Encounter (HOSPITAL_COMMUNITY)
Admission: RE | Disposition: A | Discharge status: Home / Self Care | Payer: Self-pay | Source: Ambulatory Visit | Attending: Internal Medicine

## 2017-09-07 ENCOUNTER — Other Ambulatory Visit: Payer: Self-pay

## 2017-09-07 ENCOUNTER — Encounter (HOSPITAL_COMMUNITY): Payer: Self-pay | Admitting: *Deleted

## 2017-09-07 ENCOUNTER — Ambulatory Visit (HOSPITAL_COMMUNITY)
Admission: RE | Admit: 2017-09-07 | Discharge: 2017-09-07 | Disposition: A | Discharge status: Home / Self Care | Payer: Medicare Other | Source: Ambulatory Visit | Attending: Internal Medicine | Admitting: Internal Medicine

## 2017-09-07 DIAGNOSIS — Z87891 Personal history of nicotine dependence: Secondary | ICD-10-CM | POA: Diagnosis not present

## 2017-09-07 DIAGNOSIS — I129 Hypertensive chronic kidney disease with stage 1 through stage 4 chronic kidney disease, or unspecified chronic kidney disease: Secondary | ICD-10-CM | POA: Insufficient documentation

## 2017-09-07 DIAGNOSIS — Z79899 Other long term (current) drug therapy: Secondary | ICD-10-CM | POA: Diagnosis not present

## 2017-09-07 DIAGNOSIS — R935 Abnormal findings on diagnostic imaging of other abdominal regions, including retroperitoneum: Secondary | ICD-10-CM

## 2017-09-07 DIAGNOSIS — Z951 Presence of aortocoronary bypass graft: Secondary | ICD-10-CM | POA: Insufficient documentation

## 2017-09-07 DIAGNOSIS — D125 Benign neoplasm of sigmoid colon: Secondary | ICD-10-CM | POA: Diagnosis not present

## 2017-09-07 DIAGNOSIS — R933 Abnormal findings on diagnostic imaging of other parts of digestive tract: Secondary | ICD-10-CM | POA: Diagnosis not present

## 2017-09-07 DIAGNOSIS — Z7982 Long term (current) use of aspirin: Secondary | ICD-10-CM | POA: Diagnosis not present

## 2017-09-07 DIAGNOSIS — N189 Chronic kidney disease, unspecified: Secondary | ICD-10-CM | POA: Insufficient documentation

## 2017-09-07 DIAGNOSIS — I2510 Atherosclerotic heart disease of native coronary artery without angina pectoris: Secondary | ICD-10-CM | POA: Insufficient documentation

## 2017-09-07 DIAGNOSIS — Z7984 Long term (current) use of oral hypoglycemic drugs: Secondary | ICD-10-CM | POA: Diagnosis not present

## 2017-09-07 DIAGNOSIS — D122 Benign neoplasm of ascending colon: Secondary | ICD-10-CM

## 2017-09-07 DIAGNOSIS — E1122 Type 2 diabetes mellitus with diabetic chronic kidney disease: Secondary | ICD-10-CM | POA: Insufficient documentation

## 2017-09-07 DIAGNOSIS — K5730 Diverticulosis of large intestine without perforation or abscess without bleeding: Secondary | ICD-10-CM | POA: Insufficient documentation

## 2017-09-07 DIAGNOSIS — D124 Benign neoplasm of descending colon: Secondary | ICD-10-CM | POA: Diagnosis not present

## 2017-09-07 DIAGNOSIS — D123 Benign neoplasm of transverse colon: Secondary | ICD-10-CM | POA: Insufficient documentation

## 2017-09-07 HISTORY — PX: COLONOSCOPY: SHX5424

## 2017-09-07 HISTORY — PX: POLYPECTOMY: SHX5525

## 2017-09-07 LAB — GLUCOSE, CAPILLARY: Glucose-Capillary: 174 mg/dL — ABNORMAL HIGH (ref 65–99)

## 2017-09-07 SURGERY — COLONOSCOPY
Anesthesia: Moderate Sedation

## 2017-09-07 MED ORDER — SODIUM CHLORIDE 0.9 % IV SOLN
INTRAVENOUS | Status: DC
  Administered 2017-09-07: 10:00:00 via INTRAVENOUS

## 2017-09-07 MED ORDER — MEPERIDINE HCL 100 MG/ML IJ SOLN
INTRAMUSCULAR | Status: DC | PRN
  Administered 2017-09-07: 50 mg via INTRAVENOUS
  Administered 2017-09-07: 25 mg via INTRAVENOUS

## 2017-09-07 MED ORDER — ONDANSETRON HCL 4 MG/2ML IJ SOLN
INTRAMUSCULAR | Status: AC
  Filled 2017-09-07: qty 2

## 2017-09-07 MED ORDER — MIDAZOLAM HCL 5 MG/5ML IJ SOLN
INTRAMUSCULAR | Status: AC
  Filled 2017-09-07: qty 10

## 2017-09-07 MED ORDER — MEPERIDINE HCL 100 MG/ML IJ SOLN
INTRAMUSCULAR | Status: AC
  Filled 2017-09-07: qty 2

## 2017-09-07 MED ORDER — ONDANSETRON HCL 4 MG/2ML IJ SOLN
INTRAMUSCULAR | Status: DC | PRN
  Administered 2017-09-07: 4 mg via INTRAVENOUS

## 2017-09-07 MED ORDER — STERILE WATER FOR IRRIGATION IR SOLN
Status: DC | PRN
  Administered 2017-09-07: 2.5 mL

## 2017-09-07 MED ORDER — MIDAZOLAM HCL 5 MG/5ML IJ SOLN
INTRAMUSCULAR | Status: DC | PRN
  Administered 2017-09-07: 1 mg via INTRAVENOUS
  Administered 2017-09-07: 2 mg via INTRAVENOUS
  Administered 2017-09-07: 1 mg via INTRAVENOUS

## 2017-09-07 NOTE — Interval H&P Note (Signed)
History and Physical Interval Note:  09/07/2017 9:39 AM  [name]  has presented today for surgery, with the diagnosis of abnormal CT  The various methods of treatment have been discussed with the patient and family. After consideration of risks, benefits and other options for treatment, the patient has consented to  Procedure(s) with comments: COLONOSCOPY (N/A) - 10:30am as a surgical intervention .  The patient's history has been reviewed, patient examined, no change in status, stable for surgery.  I have reviewed the patient's chart and labs.  Questions were answered to the patient's satisfaction.     Manus Rudd

## 2017-09-07 NOTE — Op Note (Signed)
[facility] Patient Name: [name] Procedure Date: 09/07/2017 9:36 AM MRN: [id number] Date of Birth: 27-Jan-1941 Attending MD: [name] , MD CSN: [id number] Age: 77 Admit Type: Outpatient Procedure:                Colonoscopy Indications:              Abnormal PET scan of the GI tract Providers:                Anthony Richards, MD, Lurline Del, RN, Randa Spike, Technician Referring MD:              Medicines:                Midazolam 4 mg IV, Meperidine 75 mg IV, Ondansetron                            4 mg IV Complications:            No immediate complications. Estimated Blood Loss:     Estimated blood loss was minimal. Procedure:                After obtaining informed consent, the colonoscope                            was passed under direct vision. Throughout the                            procedure, the patient's blood pressure, pulse, and                            oxygen saturations were monitored continuously. The                            EC-[id number] ([id number]) scope was introduced through                            the anus and advanced to the the cecum, identified                            by appendiceal orifice and ileocecal valve. Scope In: 10:02:16 AM Scope Out: 10:31:07 AM Scope Withdrawal Time: 0 hours 22 minutes 17 seconds  Total Procedure Duration: 0 hours 28 minutes 51 seconds  Findings:      14 sessile polyps were found in the descending colon, splenic flexure,       transverse colon, mid transverse colon, hepatic flexure and ascending       colon. The polyps were 4 to 8 mm in size. These polyps were removed with       a cold snare. Resection and retrieval were complete. Estimated blood       loss was minimal.      A 12 mm polyp was found in the proximal sigmoid colon. The polyp was       pedunculated. The polyp was removed with a hot snare. Resection and       retrieval were complete. Estimated blood loss  was minimal. Hemostasis       clip ?1 placed at the base to ensure hemostasis/closure.      Scattered small-mouthed diverticula were found in the sigmoid colon,       descending colon and transverse colon. The exam was otherwise without       abnormality on direct and retroflexion views. Impression:               - (14) 4 to 8 mm polyps in the descending colon, at                            the splenic flexure, in the transverse colon, in                            the mid transverse colon, at the hepatic flexure                            and in the ascending colon, removed with a cold                            snare. Resected and retrieved.                           - One 12 mm polyp in the sigmoid colon, removed                            with a hot snare. Resected and retrieved.                           - The examination was otherwise normal on direct                            and retroflexion views.                           - Diverticulosis in the sigmoid colon, in the                            descending colon and in the transverse colon. Moderate Sedation:      Moderate (conscious) sedation was administered by the endoscopy nurse       and supervised by the endoscopist. The following parameters were       monitored: oxygen saturation, heart rate, blood pressure, respiratory       rate, EKG, adequacy of pulmonary ventilation, and response to care.       Total physician intraservice time was 34 minutes. Recommendation:           - Patient has a contact number available for                            emergencies. The signs and symptoms of potential                            delayed complications were discussed with the  patient. Return to normal activities tomorrow.                            Written discharge instructions were provided to the                            patient.                           - Resume previous diet.                            - Repeat colonoscopy date to be determined after                            pending pathology results are reviewed for                            surveillance based on pathology results.                           - Return to GI office (date not yet determined). No                            future MRI until clip gone. Procedure Code(s):        --- Professional ---                           [phone], Colonoscopy, flexible; with removal of                            tumor(s), polyp(s), or other lesion(s) by snare                            technique                           G0500, Moderate sedation services provided by the                            same physician or other qualified health care                            professional performing a gastrointestinal                            endoscopic service that sedation supports,                            requiring the presence of an independent trained                            observer to assist in the monitoring of the                            patient's level  of consciousness and physiological                            status; initial 15 minutes of intra-service time;                            patient age 33 years or older (additional time may                            be reported with [phone], as appropriate)                           [phone], Moderate sedation services provided by the                            same physician or other qualified health care                            professional performing the diagnostic or                            therapeutic service that the sedation supports,                            requiring the presence of an independent trained                            observer to assist in the monitoring of the                            patient's level of consciousness and physiological                            status; each additional 15 minutes intraservice                            time (List  separately in addition to code for                            primary service) Diagnosis Code(s):        --- Professional ---                           D12.4, Benign neoplasm of descending colon                           D12.3, Benign neoplasm of transverse colon (hepatic                            flexure or splenic flexure)                           D12.2, Benign neoplasm of ascending colon  D12.5, Benign neoplasm of sigmoid colon                           K57.30, Diverticulosis of large intestine without                            perforation or abscess without bleeding                           R93.3, Abnormal findings on diagnostic imaging of                            other parts of digestive tract CPT copyright 2017 [employer]. All rights reserved. The codes documented in this report are preliminary and upon coder review may  be revised to meet current compliance requirements. [name]. Anthony Mckenzie, MD Anthony Richards, MD 09/07/2017 10:50:23 AM This report has been signed electronically. Number of Addenda: 0

## 2017-09-07 NOTE — Discharge Instructions (Signed)
Colonoscopy Discharge Instructions  Read the instructions outlined below and refer to this sheet in the next few weeks. These discharge instructions provide you with general information on caring for yourself after you leave the hospital. Your doctor may also give you specific instructions. While your treatment has been planned according to the most current medical practices available, unavoidable complications occasionally occur. If you have any problems or questions after discharge, call Dr. Gala Romney at [phone]. ACTIVITY  You may resume your regular activity, but move at a slower pace for the next 24 hours.   Take frequent rest periods for the next 24 hours.   Walking will help get rid of the air and reduce the bloated feeling in your belly (abdomen).   No driving for 24 hours (because of the medicine (anesthesia) used during the test).    Do not sign any important legal documents or operate any machinery for 24 hours (because of the anesthesia used during the test).  NUTRITION  Drink plenty of fluids.   You may resume your normal diet as instructed by your doctor.   Begin with a light meal and progress to your normal diet. Heavy or fried foods are harder to digest and may make you feel sick to your stomach (nauseated).   Avoid alcoholic beverages for 24 hours or as instructed.  MEDICATIONS  You may resume your normal medications unless your doctor tells you otherwise.  WHAT YOU CAN EXPECT TODAY  Some feelings of bloating in the abdomen.   Passage of more gas than usual.   Spotting of blood in your stool or on the toilet paper.  IF YOU HAD POLYPS REMOVED DURING THE COLONOSCOPY:  No aspirin products for 7 days or as instructed.   No alcohol for 7 days or as instructed.   Eat a soft diet for the next 24 hours.  FINDING OUT THE RESULTS OF YOUR TEST Not all test results are available during your visit. If your test results are not back during the visit, make an appointment  with your caregiver to find out the results. Do not assume everything is normal if you have not heard from your caregiver or the medical facility. It is important for you to follow up on all of your test results.  SEEK IMMEDIATE MEDICAL ATTENTION IF:  You have more than a spotting of blood in your stool.   Your belly is swollen (abdominal distention).   You are nauseated or vomiting.   You have a temperature over 101.   You have abdominal pain or discomfort that is severe or gets worse throughout the day.    Colon polyp and diverticulosis information provided  Further recommendations to follow pending review of pathology report   Diverticulosis Diverticulosis is a condition that develops when small pouches (diverticula) form in the wall of the large intestine (colon). The colon is where water is absorbed and stool is formed. The pouches form when the inside layer of the colon pushes through weak spots in the outer layers of the colon. You may have a few pouches or many of them. What are the causes? The cause of this condition is not known. What increases the risk? The following factors may make you more likely to develop this condition:  Being older than age 49. Your risk for this condition increases with age. Diverticulosis is rare among people younger than age 38. By age 40, many people have it.  Eating a low-fiber diet.  Having frequent constipation.  Being overweight.  Not getting enough exercise.  Smoking.  Taking over-the-counter pain medicines, like aspirin and ibuprofen.  Having a family history of diverticulosis.  What are the signs or symptoms? In most people, there are no symptoms of this condition. If you do have symptoms, they may include:  Bloating.  Cramps in the abdomen.  Constipation or diarrhea.  Pain in the lower left side of the abdomen.  How is this diagnosed? This condition is most often diagnosed during an exam for other colon problems.  Because diverticulosis usually has no symptoms, it often cannot be diagnosed independently. This condition may be diagnosed by:  Using a flexible scope to examine the colon (colonoscopy).  Taking an X-ray of the colon after dye has been put into the colon (barium enema).  Doing a CT scan.  How is this treated? You may not need treatment for this condition if you have never developed an infection related to diverticulosis. If you have had an infection before, treatment may include:  Eating a high-fiber diet. This may include eating more fruits, vegetables, and grains.  Taking a fiber supplement.  Taking a live bacteria supplement (probiotic).  Taking medicine to relax your colon.  Taking antibiotic medicines.  Follow these instructions at home:  Drink 6-8 glasses of water or more each day to prevent constipation.  Try not to strain when you have a bowel movement.  If you have had an infection before: ? Eat more fiber as directed by your health care provider or your diet and nutrition specialist (dietitian). ? Take a fiber supplement or probiotic, if your health care provider approves.  Take over-the-counter and prescription medicines only as told by your health care provider.  If you were prescribed an antibiotic, take it as told by your health care provider. Do not stop taking the antibiotic even if you start to feel better.  Keep all follow-up visits as told by your health care provider. This is important. Contact a health care provider if:  You have pain in your abdomen.  You have bloating.  You have cramps.  You have not had a bowel movement in 3 days. Get help right away if:  Your pain gets worse.  Your bloating becomes very bad.  You have a fever or chills, and your symptoms suddenly get worse.  You vomit.  You have bowel movements that are bloody or black.  You have bleeding from your rectum. Summary  Diverticulosis is a condition that develops when  small pouches (diverticula) form in the wall of the large intestine (colon).  You may have a few pouches or many of them.  This condition is most often diagnosed during an exam for other colon problems.  If you have had an infection related to diverticulosis, treatment may include increasing the fiber in your diet, taking supplements, or taking medicines. This information is not intended to replace advice given to you by your health care provider. Make sure you discuss any questions you have with your health care provider. Document Released: 01/29/2004 Document Revised: 03/22/2016 Document Reviewed: 03/22/2016 Elsevier Interactive Patient Education  2017 [place].  Colon Polyps Polyps are tissue growths inside the body. Polyps can grow in many places, including the large intestine (colon). A polyp may be a round bump or a mushroom-shaped growth. You could have one polyp or several. Most colon polyps are noncancerous (benign). However, some colon polyps can become cancerous over time. What are the causes? The exact cause of colon polyps is not known. What  increases the risk? This condition is more likely to develop in people who:  Have a family history of colon cancer or colon polyps.  Are older than 35 or older than 45 if they are African American.  Have inflammatory bowel disease, such as ulcerative colitis or Crohn disease.  Are overweight.  Smoke cigarettes.  Do not get enough exercise.  Drink too much alcohol.  Eat a diet that is: ? High in fat and red meat. ? Low in fiber.  Had childhood cancer that was treated with abdominal radiation.  What are the signs or symptoms? Most polyps do not cause symptoms. If you have symptoms, they may include:  Blood coming from your rectum when having a bowel movement.  Blood in your stool.The stool may look dark red or black.  A change in bowel habits, such as constipation or diarrhea.  How is this diagnosed? This  condition is diagnosed with a colonoscopy. This is a procedure that uses a lighted, flexible scope to look at the inside of your colon. How is this treated? Treatment for this condition involves removing any polyps that are found. Those polyps will then be tested for cancer. If cancer is found, your health care provider will talk to you about options for colon cancer treatment. Follow these instructions at home: Diet  Eat plenty of fiber, such as fruits, vegetables, and whole grains.  Eat foods that are high in calcium and vitamin D, such as milk, cheese, yogurt, eggs, liver, fish, and broccoli.  Limit foods high in fat, red meats, and processed meats, such as hot dogs, sausage, bacon, and lunch meats.  Maintain a healthy weight, or lose weight if recommended by your health care provider. General instructions  Do not smoke cigarettes.  Do not drink alcohol excessively.  Keep all follow-up visits as told by your health care provider. This is important. This includes keeping regularly scheduled colonoscopies. Talk to your health care provider about when you need a colonoscopy.  Exercise every day or as told by your health care provider. Contact a health care provider if:  You have new or worsening bleeding during a bowel movement.  You have new or increased blood in your stool.  You have a change in bowel habits.  You unexpectedly lose weight. This information is not intended to replace advice given to you by your health care provider. Make sure you discuss any questions you have with your health care provider. Document Released: 01/28/2004 Document Revised: 10/09/2015 Document Reviewed: 03/24/2015 Elsevier Interactive Patient Education  2018 [employer].  Diverticulosis and polyp information provided  No future MRI until clip gone  Further recommendations to follow pending review of pathology report

## 2017-09-07 NOTE — Interval H&P Note (Signed)
History and Physical Interval Note:  09/07/2017 9:53 AM  [name]  has presented today for surgery, with the diagnosis of abnormal CT  The various methods of treatment have been discussed with the patient and family. After consideration of risks, benefits and other options for treatment, the patient has consented to  Procedure(s) with comments: COLONOSCOPY (N/A) - 10:30am as a surgical intervention .  The patient's history has been reviewed, patient examined, no change in status, stable for surgery.  I have reviewed the patient's chart and labs.  Questions were answered to the patient's satisfaction.     Erinn Huskins   Abnormal colon on PET CT. Diagnostic colonoscopy today per plan. The risks, benefits, limitations, alternatives and imponderables have been reviewed with the patient. Questions have been answered. All parties are agreeable.

## 2017-09-08 ENCOUNTER — Inpatient Hospital Stay (HOSPITAL_BASED_OUTPATIENT_CLINIC_OR_DEPARTMENT_OTHER): Payer: Medicare Other | Admitting: Hematology

## 2017-09-08 ENCOUNTER — Encounter (HOSPITAL_COMMUNITY): Payer: Self-pay | Admitting: Hematology

## 2017-09-08 ENCOUNTER — Other Ambulatory Visit: Payer: Self-pay

## 2017-09-08 ENCOUNTER — Encounter (HOSPITAL_COMMUNITY): Payer: Self-pay | Admitting: Adult Health

## 2017-09-08 VITALS — BP 145/67 | HR 54 | Temp 97.6°F | Resp 16 | Wt 199.4 lb

## 2017-09-08 DIAGNOSIS — R5383 Other fatigue: Secondary | ICD-10-CM

## 2017-09-08 DIAGNOSIS — N189 Chronic kidney disease, unspecified: Secondary | ICD-10-CM

## 2017-09-08 DIAGNOSIS — E1122 Type 2 diabetes mellitus with diabetic chronic kidney disease: Secondary | ICD-10-CM | POA: Diagnosis not present

## 2017-09-08 DIAGNOSIS — R918 Other nonspecific abnormal finding of lung field: Secondary | ICD-10-CM | POA: Diagnosis not present

## 2017-09-08 DIAGNOSIS — Z77090 Contact with and (suspected) exposure to asbestos: Secondary | ICD-10-CM | POA: Diagnosis not present

## 2017-09-08 DIAGNOSIS — C3431 Malignant neoplasm of lower lobe, right bronchus or lung: Secondary | ICD-10-CM | POA: Diagnosis not present

## 2017-09-08 DIAGNOSIS — Z87891 Personal history of nicotine dependence: Secondary | ICD-10-CM | POA: Diagnosis not present

## 2017-09-08 DIAGNOSIS — C3490 Malignant neoplasm of unspecified part of unspecified bronchus or lung: Secondary | ICD-10-CM

## 2017-09-08 DIAGNOSIS — I129 Hypertensive chronic kidney disease with stage 1 through stage 4 chronic kidney disease, or unspecified chronic kidney disease: Secondary | ICD-10-CM | POA: Diagnosis not present

## 2017-09-08 DIAGNOSIS — R0602 Shortness of breath: Secondary | ICD-10-CM | POA: Diagnosis not present

## 2017-09-08 DIAGNOSIS — C801 Malignant (primary) neoplasm, unspecified: Secondary | ICD-10-CM

## 2017-09-08 NOTE — Patient Instructions (Signed)
[facility] at [facility] Discharge Instructions  Today you saw Dr. K.   Thank you for choosing [facility] at [facility] to provide your oncology and hematology care.  To afford each patient quality time with our provider, please arrive at least 15 minutes before your scheduled appointment time.   If you have a lab appointment with the Cancer Center please come in thru the  Main Entrance and check in at the main information desk  You need to re-schedule your appointment should you arrive 10 or more minutes late.  We strive to give you quality time with our providers, and arriving late affects you and other patients whose appointments are after yours.  Also, if you no show three or more times for appointments you may be dismissed from the clinic at the providers discretion.     Again, thank you for choosing [facility].  Our hope is that these requests will decrease the amount of time that you wait before being seen by our physicians.       _____________________________________________________________  Should you have questions after your visit to [facility], please contact our office at [phone] between the hours of 8:30 a.m. and 4:30 p.m.  Voicemails left after 4:30 p.m. will not be returned until the following business day.  For prescription refill requests, have your pharmacy contact our office.       Resources For Cancer Patients and their Caregivers ? American [employer] Society: Can assist with transportation, wigs, general needs, runs Look Good Feel Better.        1-888-227-6333 ? Cancer Care: Provides financial assistance, online support groups, medication/co-pay assistance.  1-800-813-HOPE (4673) ? [facility] Assists [place] cancer patients and their families through emotional , educational and financial support.  336-427-4357 ? [place] DSS Where to apply for food  stamps, Medicaid and utility assistance. 336-342-1394 ? [facility]: Transportation to medical appointments. 336-347-2287 ? Social Security Administration: May apply for disability if have a Stage IV cancer. 336-342-7796 1-800-772-1213 ? [place] [facility], Dis[facility] and Transit Services: Assists with nutrition, care and transit needs. 336-349-2343  Cancer Center Support Programs:   > Cancer Support Group  2nd Tuesday of the month 1pm-2pm, Journey Room   > Creative Journey  3rd Tuesday of the month 1130am-1pm, Journey Room    

## 2017-09-08 NOTE — Progress Notes (Signed)
[facility] FOLLOW-UP NOTE  Patient Care Team: [name], MD as PCP - General (Internal Medicine) Herminio Commons, MD as PCP - Cardiology (Cardiology) Gala Romney Cristopher Estimable, MD as Consulting Physician (Gastroenterology)  CHIEF COMPLAINTS:  Adenocarcinoma on biopsy of the right lung nodule.  HISTORY OF PRESENTING ILLNESS:  [name] 77 y.o. male is seen in consultation today for further workup and management of bilateral lung nodules.  He had a chest x-ray done for shortness of breath which showed multiple bilateral lung nodules.  PET/CT scan on 08/02/2017 confirmed multiple PET avid subcentimeter lung nodules, largest measuring 1.2 cm in the left middle lobe.  CT-guided biopsy was arranged on 08/18/2016 of the right lower lobe lung nodule.  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  PET CT scan also shows showed nonspecific uptake in the bowels with no CT correlate.  The patient is an ex-smoker who quit smoking 25 years ago, 1 and1/2 packs for a day for 30 years.  He also had minor work-related asbestos exposure.  His last colonoscopy was in 2007 which was within normal limits.  He denies any cough or hemoptysis.  He denies any fevers, night sweats or weight loss.  He denies any bleeding per rectum or melena.  No change in bowel habits was reported.  No headaches or vision changes.  He was able to do yard work until few weeks ago.  He is getting tired easily.  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  He lives at home with his wife.   INTERVAL HISTORY:  [name] 77 y.o. 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   He had colonoscopy yesterday, 09/07/17. Report reviewed. 14 polyps (measuring 4-8 mm) removed in the descending colon, at splenic flexure, in transverse colon, at hepatic flexure, and in ascending colon.  1  polyp (12 mm) was also removed from sigmoid colon.  Pathology negative for malignancy.   His functional status is good and he is able to do all his activities.  Denies any cough or hemoptysis.  No chest pains were reported.   MEDICAL HISTORY:  Past Medical History:  Diagnosis Date  . Chronic kidney disease   . Coronary artery disease   . Diabetes mellitus   . Hypertension   . 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 Spontaneous pneumothorax 09/15/2010   right    SURGICAL HISTORY: Past Surgical History:  Procedure Laterality Date  . ANKLE FRACTURE SURGERY  2008   [facility]  . APPENDECTOMY  1960's  . CHEST TUBE INSERTION Right 09/15/2010   Dr Servando Snare - spontaneous PTX  . CHOLECYSTECTOMY  2008  . CORONARY ARTERY BYPASS GRAFT  2003  . EYE SURGERY  2001   Cataracts removed bilaterally  . INCISIONAL HERNIA REPAIR N/A 01/29/2015   Procedure: Fatima Blank HERNIORRHAPHY WITH MESH;  Surgeon: Aviva Signs, MD;  Location: [facility];  Service: General;  Laterality: N/A;  . INSERTION OF MESH N/A 01/29/2015   Procedure: INSERTION OF MESH;  Surgeon: Aviva Signs, MD;  Location: [facility];  Service: General;  Laterality: N/A;  . [place]  . LEFT HEART CATH AND CORS/GRAFTS ANGIOGRAPHY N/A 06/20/2017   Procedure: LEFT HEART CATH AND CORS/GRAFTS  ANGIOGRAPHY;  Surgeon: Martinique, Peter M, MD;  Location: [facility];  Service: Cardiovascular;  Laterality: N/A;  . ORIF ANKLE FRACTURE  08/26/2011   Procedure: OPEN REDUCTION INTERNAL FIXATION (ORIF) ANKLE FRACTURE;  Surgeon: Sanjuana Kava, MD;  Location: [facility];  Service: Orthopedics;  Laterality: Right;  . PROSTATE SURGERY  2012   Enlarged prostate  . TRANSURETHRAL RESECTION OF PROSTATE  2012    SOCIAL HISTORY: Social History   Socioeconomic History  . Marital status: Married    Spouse name: Not on file  .  Number of children: Not on file  . Years of education: Not on file  . Highest education level: Not on file  Occupational History  . Not on file  Social Needs  . Financial resource strain: Not on file  . Food insecurity:    Worry: Not on file    Inability: Not on file  . Transportation needs:    Medical: Not on file    Non-medical: Not on file  Tobacco Use  . Smoking status: Former Smoker    Packs/day: 1.00    Years: 33.00    Pack years: 33.00    Last attempt to quit: 05/17/1987    Years since quitting: 30.3  . Smokeless tobacco: Never Used  Substance and Sexual Activity  . Alcohol use: No  . Drug use: No  . Sexual activity: Yes  Lifestyle  . Physical activity:    Days per week: Not on file    Minutes per session: Not on file  . Stress: Not on file  Relationships  . Social connections:    Talks on phone: Not on file    Gets together: Not on file    Attends religious service: Not on file    Active member of club or organization: Not on file    Attends meetings of clubs or organizations: Not on file    Relationship status: Not on file  . Intimate partner violence:    Fear of current or ex partner: Not on file    Emotionally abused: Not on file    Physically abused: Not on file    Forced sexual activity: Not on file  Other Topics Concern  . Not on file  Social History Narrative  . Not on file    FAMILY HISTORY: Family History  Problem Relation Age of Onset  . Aneurysm Mother   . Heart attack Father   . Colon cancer Maternal Uncle   . Cancer Brother   . Gastric cancer Neg Hx   . Esophageal cancer Neg Hx     ALLERGIES:  has No Known Allergies.  MEDICATIONS:  Current Outpatient Medications  Medication Sig Dispense Refill  . aspirin EC 81 MG tablet Take 81 mg by mouth daily.    . Cholecalciferol (VITAMIN D3) 5000 units CAPS Take 5,000 Units by mouth daily.    Marland Kitchen glimepiride (AMARYL) 2 MG tablet Take 2 mg by mouth daily with breakfast.     . isosorbide  mononitrate (IMDUR) 30 MG 24 hr tablet Take 1 tablet (30 mg total) by mouth daily. 90 tablet 3  . metFORMIN (GLUCOPHAGE) 500 MG tablet Take 1 tablet (500 mg total) by mouth 2 (two) times daily with a meal.    . metoprolol (LOPRESSOR) 100 MG tablet Take 100 mg by mouth 2 (two) times daily.    . nitroGLYCERIN (NITROSTAT) 0.4 MG SL tablet Place 0.4 mg under the tongue every 5 (five) minutes as needed for chest pain.     Marland Kitchen  pioglitazone (ACTOS) 45 MG tablet Take 45 mg by mouth daily.    . sitaGLIPtin (JANUVIA) 50 MG tablet Take 50 mg by mouth daily.      No current facility-administered medications for this visit.     REVIEW OF SYSTEMS:   Constitutional: Denies fevers, chills or abnormal night sweats Eyes: Denies blurriness of vision, double vision or watery eyes Ears, nose, mouth, throat, and face: Denies mucositis or sore throat Respiratory: Denies cough, dyspnea or wheezes Cardiovascular: Denies palpitation, chest discomfort or lower extremity swelling Gastrointestinal:  Denies nausea, heartburn or change in bowel habits Skin: Denies abnormal skin rashes Lymphatics: Denies new lymphadenopathy or easy bruising Neurological:Denies numbness, tingling or new weaknesses Behavioral/Psych: Mood is stable, no new changes  All other systems were reviewed with the patient and are negative.  PHYSICAL EXAMINATION: ECOG PERFORMANCE STATUS: 1 - Symptomatic but completely ambulatory  I have reviewed his vitals. GENERAL:alert, no distress and comfortable SKIN: skin color, texture, turgor are normal, no rashes or significant lesions.  Median sternotomy scar present. EYES: normal, conjunctiva are pink and non-injected, sclera clear OROPHARYNX:no exudate, no erythema and lips, buccal mucosa, and tongue normal  NECK: supple, thyroid normal size, non-tender, without nodularity LYMPH:  no palpable lymphadenopathy in the cervical, axillary or inguinal LUNGS: clear to auscultation and percussion with normal  breathing effort HEART: regular rate & rhythm and no murmurs and no lower extremity edema ABDOMEN:abdomen soft, non-tender and normal bowel sounds Musculoskeletal:no cyanosis of digits and no clubbing  PSYCH: alert & oriented x 3 with fluent speech NEURO: no focal motor/sensory deficits  LABORATORY DATA:  I have reviewed the data as listed Lab Results  Component Value Date   WBC 8.7 08/18/2017   HGB 13.8 08/18/2017   HCT 41.4 08/18/2017   MCV 82.6 08/18/2017   PLT 191 08/18/2017     Chemistry      Component Value Date/Time   NA 133 (L) 06/17/2017 0951   K 4.8 06/17/2017 0951   CL 97 (L) 06/17/2017 0951   CO2 27 06/17/2017 0951   BUN 24 (H) 06/17/2017 0951   CREATININE 1.80 (H) 09/01/2017 1015      Component Value Date/Time   CALCIUM 9.4 06/17/2017 0951   ALKPHOS 85 09/09/2010 1008   AST 21 09/09/2010 1008   ALT 10 09/09/2010 1008   BILITOT 0.6 09/09/2010 1008       RADIOGRAPHIC STUDIES: I have personally reviewed the radiological images as listed and agreed with the findings in the report. Mr Jeri Cos Wo Contrast  Result Date: 09/01/2017 CLINICAL DATA:  Lung cancer workup staging EXAM: MRI HEAD WITHOUT AND WITH CONTRAST TECHNIQUE: Multiplanar, multiecho pulse sequences of the brain and surrounding structures were obtained without and with intravenous contrast. CONTRAST:  27mL MULTIHANCE GADOBENATE DIMEGLUMINE 529 MG/ML IV SOLN COMPARISON:  None. FINDINGS: Brain: Mild atrophy. Negative for hydrocephalus. Negative for acute infarct. Periventricular confluent and patchy white matter hyperintensities bilaterally with a chronic appearance. Mild chronic signal changes in the pons. Negative for hemorrhage or mass No enhancing lesions.  Leptomeningeal enhancement is normal. Vascular: Normal arterial flow voids Skull and upper cervical spine: Negative Sinuses/Orbits: Mild mucosal edema paranasal sinuses. Bilateral cataract surgery Other: None IMPRESSION: Negative for metastatic  disease. Atrophy and chronic microvascular ischemia in the white matter. No acute abnormality. Electronically Signed   By: Franchot Gallo M.D.   On: 09/01/2017 12:40   Ct Biopsy  Result Date: 08/18/2017 INDICATION: 77 year old male with a history of multiple lung nodules with unknown  primary malignancy EXAM: CT BIOPSY MEDICATIONS: None. ANESTHESIA/SEDATION: Moderate (conscious) sedation was employed during this procedure. A total of Versed 1.5 mg and Fentanyl 75 mcg was administered intravenously. Moderate Sedation Time: 15 minutes. The patient's level of consciousness and vital signs were monitored continuously by radiology nursing throughout the procedure under my direct supervision. FLUOROSCOPY TIME:  CT COMPLICATIONS: None PROCEDURE: The procedure, risks, benefits, and alternatives were explained to the patient and the patient's family. Specific risks that were addressed included bleeding, infection, pneumothorax, need for further procedure including chest tube placement, chance of delayed pneumothorax or hemorrhage, hemoptysis, nondiagnostic sample, cardiopulmonary collapse, death. Questions regarding the procedure were encouraged and answered. The patient understands and consents to the procedure. Patient was positioned in the right decubitus position on the CT gantry table and a scout CT of the chest was performed for planning purposes. Once angle of approach was determined, the skin and subcutaneous tissues this scan was prepped and draped in the usual sterile fashion, and a sterile drape was applied covering the operative field. A sterile gown and sterile gloves were used for the procedure. Local anesthesia was provided with 1% Lidocaine. The skin and subcutaneous tissues were infiltrated 1% lidocaine for local anesthesia, and a small stab incision was made with an 11 blade scalpel. Using CT guidance, a 17 gauge trocar needle was advanced into the right lower lobetarget. After confirmation of the tip,  separate 18 gauge core biopsies were performed. These were placed into solution for transportation to the lab. Biosentry Device was deployed. A final CT image was performed. Patient tolerated the procedure well and remained hemodynamically stable throughout. No complications were encountered and no significant blood loss was encounter IMPRESSION: Status post CT-guided biopsy of right lower lobe lung nodule. Tissue specimen sent to pathology for complete histopathologic analysis. Signed, Dulcy Fanny. Earleen Newport, DO [facility] Electronically Signed   By: Corrie Mckusick D.O.   On: 08/18/2017 13:21   Dg Chest Port 1 View  Result Date: 08/18/2017 CLINICAL DATA:  Pneumothorax after biopsy. EXAM: PORTABLE CHEST 1 VIEW COMPARISON:  CT biopsy 08/18/2017, earlier same day. Chest x-ray 06/17/2017. FINDINGS: 1319 hours. 70 upright AP film shows no definite pleural line to indicate underlying pneumothorax. Lung volumes are low. Bilateral pulmonary nodules are evident. The cardio pericardial silhouette is enlarged. Status post CABG The visualized bony structures of the thorax are intact. IMPRESSION: No definite findings of pneumothorax after right lower lobe lung biopsy. Electronically Signed   By: Misty Stanley M.D.   On: 08/18/2017 13:54     PATHOLOGY:  RLL lung biopsy: 08/18/17       ASSESSMENT & PLAN:  Adenocarcinoma of lung, stage 4 ([place]) 1.  Metastatic non-small cell lung cancer, adenocarcinoma type: - Additional testing by immunohistochemistry was positive for Napsin-A and TTF-1, favoring lung primary.  He has multiple bilateral lung nodules making him metastatic.  He had a colonoscopy which showed multiple polyps which were tubular adenomas.  His functional status is decent at this time.  He does not have any malignancy related symptoms at this time.  I have discussed the diagnosis with the patient and his wife in detail.  We will consider ordering  foundation one CDX as well as PDL 1 testing for actionable mutations.  We will see him back in 2 weeks to discuss the results and plan of therapy.  His MRI of the brain was negative for metastatic disease.  No orders of the defined types were placed  in this encounter.   All questions were answered. The patient knows to call the clinic with any problems, questions or concerns.  Total time spent is 30 minutes with more than 50% of the time spent face-to-face discussing diagnosis, prognosis and treatment options.    This note includes documentation from Mike Craze, NP, who was present during this patient's office visit and evaluation.  I have reviewed this note for its completeness and accuracy.  I have edited this note accordingly based on my findings and medical opinion.      Derek Jack, MD  09/08/2017 4:34 PM

## 2017-09-08 NOTE — Assessment & Plan Note (Signed)
1.  Metastatic non-small cell lung cancer, adenocarcinoma type: - Additional testing by immunohistochemistry was positive for Napsin-A and TTF-1, favoring lung primary.  He has multiple bilateral lung nodules making him metastatic.  He had a colonoscopy which showed multiple polyps which were tubular adenomas.  His functional status is decent at this time.  He does not have any malignancy related symptoms at this time.  I have discussed the diagnosis with the patient and his wife in detail.  We will consider ordering foundation one CDX as well as PDL 1 testing for actionable mutations.  We will see him back in 2 weeks to discuss the results and plan of therapy.  His MRI of the brain was negative for metastatic disease.

## 2017-09-08 NOTE — Progress Notes (Signed)
Called and spoke with [facility] department [phone]) to order Foundation One and PD-L1 on lung biopsy at the request of Dr. Delton Coombes.  Accession #: [id number]. These tests were ordered verbally over phone today with [facility].    Mike Craze, NP [place] [phone]

## 2017-09-10 ENCOUNTER — Encounter: Payer: Self-pay | Admitting: Internal Medicine

## 2017-09-12 ENCOUNTER — Encounter (HOSPITAL_COMMUNITY): Payer: Self-pay | Admitting: Internal Medicine

## 2017-09-12 ENCOUNTER — Telehealth: Payer: Self-pay

## 2017-09-12 NOTE — Telephone Encounter (Signed)
Per RMR-      Send letter to patient.  Send copy of letter with path to referring provider and PCP. OV in 1 year to assess candidacy for one more colonoscopy

## 2017-09-13 NOTE — Telephone Encounter (Signed)
Reminder in epic °

## 2017-09-15 ENCOUNTER — Encounter (HOSPITAL_COMMUNITY): Payer: Self-pay | Admitting: Adult Health

## 2017-09-19 DIAGNOSIS — C3490 Malignant neoplasm of unspecified part of unspecified bronchus or lung: Secondary | ICD-10-CM | POA: Diagnosis not present

## 2017-09-22 ENCOUNTER — Encounter (HOSPITAL_COMMUNITY): Payer: Self-pay | Admitting: Adult Health

## 2017-09-26 ENCOUNTER — Encounter (HOSPITAL_COMMUNITY): Payer: Self-pay | Admitting: Hematology

## 2017-09-26 ENCOUNTER — Telehealth (HOSPITAL_COMMUNITY): Payer: Self-pay | Admitting: Hematology

## 2017-09-26 ENCOUNTER — Inpatient Hospital Stay (HOSPITAL_COMMUNITY): Payer: Medicare Other | Attending: Hematology | Admitting: Hematology

## 2017-09-26 VITALS — BP 187/67 | HR 50 | Temp 97.4°F | Resp 18 | Wt 197.7 lb

## 2017-09-26 DIAGNOSIS — R05 Cough: Secondary | ICD-10-CM

## 2017-09-26 DIAGNOSIS — R5383 Other fatigue: Secondary | ICD-10-CM | POA: Diagnosis not present

## 2017-09-26 DIAGNOSIS — C3431 Malignant neoplasm of lower lobe, right bronchus or lung: Secondary | ICD-10-CM | POA: Insufficient documentation

## 2017-09-26 DIAGNOSIS — C3490 Malignant neoplasm of unspecified part of unspecified bronchus or lung: Secondary | ICD-10-CM

## 2017-09-26 MED ORDER — TRAMETINIB DIMETHYL SULFOXIDE 0.5 MG PO TABS: 1.5000 mg | ORAL_TABLET | Freq: Every day | ORAL | 0 refills | Status: DC

## 2017-09-26 MED ORDER — DABRAFENIB MESYLATE 50 MG PO CAPS: 100.0000 mg | ORAL_CAPSULE | Freq: Two times a day (BID) | ORAL | 0 refills | Status: DC

## 2017-09-26 NOTE — Progress Notes (Signed)
[name], [place] [id number]   CLINIC:  Medical Oncology/Hematology  PCP:  [name], MD [address] [place] [address] [phone]   REASON FOR VISIT:  Follow-up for metastatic lung cancer  CURRENT THERAPY: To discuss future treatment with dabrafenib and trametinib   INTERVAL HISTORY:  [name] 77 y.o. male returns for follow-up of foundation 1 CDX test results.  He is minimally symptomatic from his metastatic lung cancer.  He has occasional cough which is nonproductive.  He denies any chest pains on a consistent basis.  He had CABG 14 years ago.  He rarely uses nitroglycerin.  His blood pressure systolic was 073.  He reports that it is always normal at Dr. Ria Comment office.  When he checks it at home systolic is usually 710-626.  He reports that he had an echocardiogram in February.  We have reviewed the results which shows EF of 60 to 65%.  His appetite has been stable.  No weight loss was reported.  Energy levels are 75%.  REVIEW OF SYSTEMS:  Review of Systems  Constitutional: Positive for fatigue.  Respiratory: Positive for cough.   All other systems reviewed and are negative.    PAST MEDICAL/SURGICAL HISTORY:  Past Medical History:  Diagnosis Date  . Chronic kidney disease   . Coronary artery disease   . Diabetes mellitus   . Hypertension   . 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 Spontaneous pneumothorax 09/15/2010   right   Past Surgical History:  Procedure Laterality Date  . ANKLE FRACTURE SURGERY  2008   [facility]  . APPENDECTOMY  1960's  . CHEST TUBE INSERTION Right 09/15/2010   Dr Servando Snare - spontaneous PTX  . CHOLECYSTECTOMY  2008  . COLONOSCOPY N/A 09/07/2017   Procedure: COLONOSCOPY;  Surgeon: Daneil Dolin, MD;  Location: [facility];  Service: Endoscopy;  Laterality: N/A;  10:30am  .  CORONARY ARTERY BYPASS GRAFT  2003  . EYE SURGERY  2001   Cataracts removed bilaterally  . INCISIONAL HERNIA REPAIR N/A 01/29/2015   Procedure: Fatima Blank HERNIORRHAPHY WITH MESH;  Surgeon: Aviva Signs, MD;  Location: [facility];  Service: General;  Laterality: N/A;  . INSERTION OF MESH N/A 01/29/2015   Procedure: INSERTION OF MESH;  Surgeon: Aviva Signs, MD;  Location: [facility];  Service: General;  Laterality: N/A;  . [place]  . LEFT HEART CATH AND CORS/GRAFTS ANGIOGRAPHY N/A 06/20/2017   Procedure: LEFT HEART CATH AND CORS/GRAFTS ANGIOGRAPHY;  Surgeon: Martinique, Peter M, MD;  Location: [facility];  Service: Cardiovascular;  Laterality: N/A;  . ORIF ANKLE FRACTURE  08/26/2011   Procedure: OPEN REDUCTION INTERNAL FIXATION (ORIF) ANKLE FRACTURE;  Surgeon: Sanjuana Kava, MD;  Location: [facility];  Service: Orthopedics;  Laterality: Right;  . POLYPECTOMY  09/07/2017   Procedure: POLYPECTOMY;  Surgeon: Daneil Dolin, MD;  Location: [facility];  Service: Endoscopy;;  ascending x2 (cold snare)  . PROSTATE SURGERY  2012   Enlarged prostate  . TRANSURETHRAL RESECTION OF PROSTATE  2012     SOCIAL HISTORY:  Social History   Socioeconomic History  . Marital status: Married    Spouse name: Not on file  . Number of children: Not on file  . Years of education: Not on file  .  Highest education level: Not on file  Occupational History  . Not on file  Social Needs  . Financial resource strain: Not on file  . Food insecurity:    Worry: Not on file    Inability: Not on file  . Transportation needs:    Medical: Not on file    Non-medical: Not on file  Tobacco Use  . Smoking status: Former Smoker    Packs/day: 1.00    Years: 33.00    Pack years: 33.00    Last attempt to quit: 05/17/1987    Years since quitting: 30.3  . Smokeless tobacco: Never Used  Substance and Sexual Activity  . Alcohol use: No  . Drug use: No  . Sexual activity: Yes  Lifestyle  .  Physical activity:    Days per week: Not on file    Minutes per session: Not on file  . Stress: Not on file  Relationships  . Social connections:    Talks on phone: Not on file    Gets together: Not on file    Attends religious service: Not on file    Active member of club or organization: Not on file    Attends meetings of clubs or organizations: Not on file    Relationship status: Not on file  . Intimate partner violence:    Fear of current or ex partner: Not on file    Emotionally abused: Not on file    Physically abused: Not on file    Forced sexual activity: Not on file  Other Topics Concern  . Not on file  Social History Narrative  . Not on file    FAMILY HISTORY:  Family History  Problem Relation Age of Onset  . Aneurysm Mother   . Heart attack Father   . Colon cancer Maternal Uncle   . Cancer Brother   . Gastric cancer Neg Hx   . Esophageal cancer Neg Hx     CURRENT MEDICATIONS:  Outpatient Encounter Medications as of 09/26/2017  Medication Sig  . aspirin EC 81 MG tablet Take 81 mg by mouth daily.  . Cholecalciferol (VITAMIN D3) 5000 units CAPS Take 5,000 Units by mouth daily.  Marland Kitchen dabrafenib mesylate (TAFLINAR) 50 MG capsule Take 2 capsules (100 mg total) by mouth 2 (two) times daily. Take on an empty stomach 1 hour before or 2 hours after meals.  Marland Kitchen glimepiride (AMARYL) 2 MG tablet Take 2 mg by mouth daily with breakfast.   . isosorbide mononitrate (IMDUR) 30 MG 24 hr tablet Take 1 tablet (30 mg total) by mouth daily.  . metFORMIN (GLUCOPHAGE) 500 MG tablet Take 1 tablet (500 mg total) by mouth 2 (two) times daily with a meal.  . metoprolol (LOPRESSOR) 100 MG tablet Take 100 mg by mouth 2 (two) times daily.  . nitroGLYCERIN (NITROSTAT) 0.4 MG SL tablet Place 0.4 mg under the tongue every 5 (five) minutes as needed for chest pain.   . pioglitazone (ACTOS) 45 MG tablet Take 45 mg by mouth daily.  . sitaGLIPtin (JANUVIA) 50 MG tablet Take 50 mg by mouth daily.   .  trametinib dimethyl sulfoxide (MEKINIST) 0.5 MG tablet Take 3 tablets (1.5 mg total) by mouth daily. Take 1 hour before or 2 hours after a meal. Store refrigerated in original container.  . [DISCONTINUED] dabrafenib mesylate (TAFLINAR) 50 MG capsule Take 2 capsules (100 mg total) by mouth 2 (two) times daily. Take on an empty stomach 1 hour before or 2 hours after meals.  . [  DISCONTINUED] trametinib dimethyl sulfoxide (MEKINIST) 0.5 MG tablet Take 3 tablets (1.5 mg total) by mouth daily. Take 1 hour before or 2 hours after a meal. Store refrigerated in original container.   No facility-administered encounter medications on file as of 09/26/2017.     ALLERGIES:  No Known Allergies   PHYSICAL EXAM:  ECOG Performance status: 1  Vitals:   09/26/17 0927  BP: (!) 187/67  Pulse: (!) 50  Resp: 18  Temp: (!) 97.4 F (36.3 C)  SpO2: 99%   Filed Weights   09/26/17 0927  Weight: 197 lb 11.2 oz (89.7 kg)    Physical Exam   LABORATORY DATA:  I have reviewed the labs as listed.  CBC    Component Value Date/Time   WBC 8.7 08/18/2017 0929   RBC 5.01 08/18/2017 0929   HGB 13.8 08/18/2017 0929   HCT 41.4 08/18/2017 0929   PLT 191 08/18/2017 0929   MCV 82.6 08/18/2017 0929   MCH 27.5 08/18/2017 0929   MCHC 33.3 08/18/2017 0929   RDW 14.0 08/18/2017 0929   LYMPHSABS 1.5 01/24/2015 1500   MONOABS 0.6 01/24/2015 1500   EOSABS 0.2 01/24/2015 1500   BASOSABS 0.0 01/24/2015 1500   CMP Latest Ref Rng & Units 09/01/2017 06/17/2017 01/24/2015  Glucose 65 - 99 mg/dL - 179(H) 166(H)  BUN 6 - 20 mg/dL - 24(H) 17  Creatinine 0.61 - 1.24 mg/dL 1.80(H) 1.61(H) 1.68(H)  Sodium 135 - 145 mmol/L - 133(L) 134(L)  Potassium 3.5 - 5.1 mmol/L - 4.8 4.8  Chloride 101 - 111 mmol/L - 97(L) 103  CO2 22 - 32 mmol/L - 27 26  Calcium 8.9 - 10.3 mg/dL - 9.4 8.6(L)  Total Protein 6.0 - 8.3 g/dL - - -  Total Bilirubin 0.3 - 1.2 mg/dL - - -  Alkaline Phos 39 - 117 U/L - - -  AST 0 - 37 U/L - - -  ALT 0 - 53 U/L  - - -       ASSESSMENT & PLAN:   Adenocarcinoma of lung, stage 4 (HCC) 1.  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  He has multiple bilateral lung nodules making him metastatic.  He had a colonoscopy which showed multiple polyps which were tubular adenomas.  His functional status is decent at this time.  He does not have any malignancy related symptoms at this time.   -I have discussed the foundation 1 results with the patient and his wife in detail.  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  PDL 1 is 0%. - We discussed starting him on combination BRAF inhibitor and MEK inhibitor.  We talked about dabrafenib 150 mg every 12 hours 1 hour before or 2-hour after meals along with trametinib 2 mg once daily.  He has history of bypass about 14 years ago.  He rarely had to take nitroglycerin.  I have reviewed his EKG on 06/17/2017 which shows QTC of 392 ms.  Echocardiogram on 06/29/2017 shows ejection fraction of 60 to 65%.  Because of his advanced age, I will start him on reduced dose of both medications.  We will start him on dabrafenib 100 mg twice daily and trametinib 1.5 mg once daily.  We talked about the side effects including but not limited to worsening of blood pressure, fluid retention, QT prolongation, fatigue, endocrine abnormalities,  skin rashes, GI toxicity and hematological toxicity.  I quoted overall response rate of 64% with a median PFS of 10.9 months.  He is agreeable to this option.  Will obtain pills through his insurance.  He is supposed to call us once he receives the shipment.  We will see him back in 1 week after start of pills.  He will require a repeat 2D echocardiogram 1 month after starting the medication and then once every 2 to 3 months afterwards.  Questions were encouraged and  answered to his satisfaction.  Total time spent is 40 minutes with more than 50% of the time spent face-to-face discussing test results, recommended treatment and side effects.    Orders placed this encounter:  Orders Placed This Encounter  Procedures  . CBC with Differential  . Comprehensive metabolic panel  . Magnesium      Derek Jack, MD [place] [phone]

## 2017-09-26 NOTE — Telephone Encounter (Signed)
COMPLETED AND FAX PA FORMS TO [facility]

## 2017-09-26 NOTE — Assessment & Plan Note (Signed)
1.  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  He has multiple bilateral lung nodules making him metastatic.  He had a colonoscopy which showed multiple polyps which were tubular adenomas.  His functional status is decent at this time.  He does not have any malignancy related symptoms at this time.   -I have discussed the foundation 1 results with the patient and his wife in detail.  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  PDL 1 is 0%. - We discussed starting him on combination BRAF inhibitor and MEK inhibitor.  We talked about dabrafenib 150 mg every 12 hours 1 hour before or 2-hour after meals along with trametinib 2 mg once daily.  He has history of bypass about 14 years ago.  He rarely had to take nitroglycerin.  I have reviewed his EKG on 06/17/2017 which shows QTC of 392 ms.  Echocardiogram on 06/29/2017 shows ejection fraction of 60 to 65%.  Because of his advanced age, I will start him on reduced dose of both medications.  We will start him on dabrafenib 100 mg twice daily and trametinib 1.5 mg once daily.  We talked about the side effects including but not limited to worsening of blood pressure, fluid retention, QT prolongation, fatigue, endocrine abnormalities, skin rashes, GI toxicity and hematological toxicity.  I quoted overall response rate of 64% with a median PFS of 10.9 months.  He is agreeable to this option.  Will obtain pills through his insurance.  He is supposed to call us once he receives the shipment.  We will see him back in 1 week after start of pills.  He will require a repeat 2D echocardiogram 1 month after starting the medication and then once every 2 to 3 months afterwards.  Questions were encouraged and answered to his satisfaction.

## 2017-09-26 NOTE — Telephone Encounter (Signed)
Faxed Taflinar/Mekinist rx to [facility] rx

## 2017-09-27 ENCOUNTER — Telehealth (HOSPITAL_COMMUNITY): Payer: Self-pay | Admitting: Hematology

## 2017-09-27 MED ORDER — PROCHLORPERAZINE MALEATE 10 MG PO TABS: 10.0000 mg | ORAL_TABLET | Freq: Four times a day (QID) | ORAL | 2 refills | Status: DC | PRN

## 2017-09-27 MED ORDER — ONDANSETRON HCL 8 MG PO TABS: 8.0000 mg | ORAL_TABLET | Freq: Three times a day (TID) | ORAL | 2 refills | Status: DC | PRN

## 2017-09-27 NOTE — Telephone Encounter (Signed)
Per [facility] Rx pts copay for meds are $2800. Funds are closed for his dx so we must apply pt to manufacturer. Filled out [employer]. Called pt to request fin doc. Pt stated he will bring docs and sign form asap.

## 2017-09-27 NOTE — Patient Instructions (Signed)
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 Take on an empty stomach 1 hour before or 2 hours after meals) and mekinist (Take 3 tablets (1.5 mg total) by mouth daily. Take 1 hour before or 2 hours after meals. Store refrigerated in original container).   You will see the doctor regularly throughout treatment.  We monitor your lab during your treatment.  The doctor monitors your response to treatment by the way you are feeling, your blood work, and scans periodically.       POTENTIAL SIDE EFFECTS OF TREATMENT:   Common side effects of TAFINLAR, in combination with MEKINIST, in people with NSCLC, include: fever, fatigue, nausea, vomiting, diarrhea, dry skin, decreased appetite, rash, swelling of the face, arms and legs, diarrhea, chills, bleeding, cough, and shortness of breath    Dabrafenib (Tafinlar)  About This Drug Dabrafenib is used to treat cancer. It is given orally (by mouth).  Possible Side Effects . Headache . Fever . Pain in the joints . Hair loss. Hair loss is often temporary, although with certain medicine, hair loss can sometimes be permanent. Hair loss may happen suddenly or gradually. If you lose hair, you may lose it from your head, face, armpits, pubic area, chest, and/or legs. You may also notice your hair getting thin. . Hand-and-foot syndrome. The palms of your hands or soles of your feet may tingle, become numb, painful, swollen, or red. . Thickening of the skin . New skin growths Note: Each of the side effects above was reported in 20% or greater of patients treated with dabrafenib. Not all possible side effects are included above. You may experience different side effects if you take dabrafenib in combination with trametinib.    Warnings and Precautions . Blurred  vision or other changes in eyesight which can rarely cause blindness. . Severe allergic skin reaction. You may develop blisters on your skin that are filled with fluid or a severe red rash all over your body that may be painful. . Changes in your heart function . Serious fever reactions. This may be accompanied by dehydration (lack of water in the body from losing too much fluid), low blood pressure and changes in your kidney function which can cause kidney failure. . Blood sugar levels may change. If you have diabetes, changes may need to be made to your diabetes medication. . Increased tumor growth with specific tumor types . This drug may raise your risk of getting a second cancer and the development of skin lesions that may or may not be cancer . Breakdown of your red blood cells which can cause anemia (decreased red blood cells) in people with a G6PD deficiency . Serious bleeding events Note: Some of the side effects above are very rare, or occur when dabrafenib and trametinib are given together. If you have concerns and/or questions, please discuss them with your medical team.  How to Take Your Medication . Take this drug by mouth without food, 12 hours apart, at least 1 hour before you eat or 2 hours after you eat. Do not open, crush or break the capsules. . Missed dose: If you miss a dose, take it as soon as you think about it ONLY if your next dose is due in more than 6 hours. If your next dose  is due in LESS than 6 hours, then skip the missed dose and contact your physician. . If you vomit a dose, take your next dose at the regular time, and contact your physician. Do not take 2 doses at the same time and do not double up on the next dose. Marland Kitchen Handling: Wash your hands after handling your medicine, your caretakers should not handle your medicine with bare hands and should wear latex gloves. . This drug may be present in the saliva, tears, sweat, urine, stool, vomit, semen, and vaginal  secretions. Talk to your doctor and/or your nurse about the necessary precautions to take during this time. . Storage: Store this medicine in the original container at room temperature. Discuss with your nurse or your doctor how to dispose of unused medicine.  Treating Side Effects . If you have diabetes, keep good control of your blood sugar level. Tell your nurse or your doctor if your glucose levels are higher or lower than normal. . Keeping your pain under control is important to your well-being. Please tell your doctor or nurse if you are experiencing pain. . Avoid sun exposure and apply sunscreen routinely when outdoors. . If you get a rash, do not put anything on it unless your doctor or nurse says you may. Keep the area around the rash clean and dry. Ask your doctor for medicine if your rash bothers you. . To help with hair loss, wash with a mild shampoo and avoid washing your hair every day. . Avoid rubbing your scalp, pat your hair or scalp dry. . Avoid coloring your hair. . Limit your use of hair spray, electric curlers, blow dryers, and curling irons. . If you are interested in getting a wig, talk to your nurse. You can also call the [facility] at [phone] to find out information about the "Look Good, Feel Better" program close to where you live. It is a free program where women getting chemotherapy can learn about wigs, turbans and scarves as well as makeup techniques and skin and nail care. . Examine your skin often for new skin lesions/growth.  Food and Drug Interactions . Do not eat grapefruit or drink grapefruit juice while taking this medicine. Grapefruit and grapefruit juice may raise the levels of dabrafenib in your body. This could make side effects worse. . Check with your doctor or pharmacist about all other prescription medicines and over-the-counter medicines and dietary supplements (vitamins, minerals, herbs and others) you are taking before starting this  medicine as there are known drug interactions with dabrafenib. Also, check with your doctor or pharmacist before starting any new prescription or over-the-counter medicines, or dietary supplement to make sure that there are no interactions.  When to Call the Doctor Call your doctor or nurse if you have any of these symptoms and/or any new or unusual symptoms: . Fever of 100.5 F (38 C) or higher . Chills . Trouble breathing . Swelling of legs, ankles, or feet . Weight gain of 5 pounds in one week (fluid retention) . Feeling that your heart is beating in a fast or not normal way (palpitations) . Blurred vision or other changes in eyesight . Red or painful eye . Headache that does not go away . Feeling dizzy or weak . Blood in your urine, vomit (bright red or coffee-ground) and/or stools (bright red, or black/tarry) Pain that does not go away or is not relieved by prescribed medicine . Abnormal blood sugar . Unusual thirst, passing urine often, headache,  sweating, shakiness, irritability . Painful, red, or swollen areas on your hands or feet. . Numbness and/or tingling of your hands and/or feet . A new rash or a rash that is not relieved by prescribed medicines . New skin lesions . If you think you may be pregnant  Reproduction Warnings . Pregnancy warning: This drug can have harmful effects on the unborn baby. Women of childbearing potential should use effective non-hormonal methods of birth control during your cancer treatment and for 2 weeks (or 4 months if taken with trametinib) after treatment. Let your doctor know right away if you think you may be pregnant. Birth control pills (oral contraceptives) may not be effective with this medication. . Breastfeeding warning: Women should not breastfeed during treatment and for 2 weeks (or 4 months if taken with trametinib) after treatment because this drug could enter the breast milk and cause harm to a breastfeeding baby. . Fertility warning: In  men and women both, this drug may affect your ability to have children in the future. Talk with your doctor or nurse if you plan to have children. Ask for information on sperm or egg banking.   Trametinib (Mekinist)  About This Drug Trametinib is used to treat cancer. It is given orally (by mouth).  Possible Side Effects . Fever and chills . Headache . Tiredness . Nausea and vomiting (throwing up) . Loose bowel movements (diarrhea) . Decreased appetite . Cough and trouble breathing . Rash . Dry skin . Swelling of your legs, arms and/or face . High blood pressure . Pain in your bones, joints and/or muscles . Abnormal bleeding Note: Each of the side effects above was reported in 20% or greater of patients treated with trametinib. Not all possible side effects are included above. You may experience different side effects if you take trametinib alone versus in combination with dabrafenib.  Warnings and Precautions . This drug may raise your risk of getting a second cancer and the development of skin lesions that may or may not be cancer. . Serious bleeding events. Symptoms may be coughing up blood, throwing up blood (may look like coffee grounds), red or black tarry bowel movements, abnormally heavy menstrual flow, nosebleeds or any other unusual bleeding. Marland Kitchen Blurred vision or other changes in eyesight, which can rarely cause blindness. . Changes in the tissue of the heart. Some changes may happen that can cause your heart to have less ability to pump blood. . Colitis, which is swelling (inflammation) in the colon - symptoms are loose bowel movements (diarrhea) stomach cramping, and sometimes blood in the bowel movements. Perforation of stomach, intestine, esophagus can rarely occur. . Inflammation (swelling) of the lungs. You may have a dry cough or trouble breathing. . Severe allergic skin reaction. You may develop blisters on your skin that are filled with fluid or a severe red rash all  over your body that may be painful. . High blood pressure . Changes in your heart function such as congestive heart failure. You may be short of breath. Your arms, hands, legs and feet may swell. . Serious fever reactions. This may be accompanied by dehydration (lack of water in the body from losing too much fluid), low blood pressure and changes in your kidney function which can cause kidney failure. . Blood clots and events such as heart attack. A blood clot in your leg may cause your leg to swell, appear red and warm, and/or cause pain. A blood clot in your lungs may cause trouble breathing, pain  when breathing, and/or chest pain. . Blood sugar levels may change. If you have diabetes, changes may need to be made to your diabetes medication. Note: Some of the side effects above are very rare. If you have concerns and/or questions, please discuss them with your medical team. Your side effects may be different if you are taking trametinib alone versus in combination with dabrafenib.  How to Take Your Medication . Take this drug by mouth without food, at least 1 hour before you eat or 2 hours after you eat. . Missed dose: If you miss a dose, take it as soon as you think about it, if your next dose is due in less than 12 hours, then skip the missed dose. Do not take 2 doses at the same time, instead, continue with your regular dosing schedule and contact your physician. . Handling: Wash your hands after handling your medicine, your caretakers should not handle your medicine with bare hands and should wear latex gloves. . This drug may be present in the saliva, tears, sweat, urine, stool, vomit, semen, and vaginal secretions. Talk to your doctor and/or your nurse about the necessary precautions to take during this time. . Storage: Store this medicine in the refrigerator between 48F to 48F (2C to [place]). Store in original container, and do not remove desiccant. Protect from moisture and light. Discuss with  your nurse or your doctor how to dispose of unused medicine.  Treating Side Effects . Drink plenty of fluids (a minimum of eight glasses per day is recommended). . If you throw up or have loose bowel movements, you should drink more fluids so that you do not become dehydrated (lack water in the body from losing too much fluid). . To help with nausea and vomiting, eat small, frequent meals instead of three large meals a day. Choose foods and drinks that are at room temperature. Ask your nurse or doctor about other helpful tips and medicine that is available to help or stop lessen these symptoms. . To help with decreased appetite, eat high caloric food such as pudding, ice cream, yogurt and milkshakes. . If you get diarrhea, eat low-fiber foods that are high in protein and calories and avoid foods that can irritate your digestive tracts or lead to cramping. . Ask your nurse or doctor about medicine that can lessen or stop your diarrhea. . If you have diabetes, keep good control of your blood sugar level. Tell your nurse or your doctor if your glucose levels are higher or lower than normal. . Manage tiredness by pacing your activities for the day. Be sure to include periods of rest between energy-draining activities. . If you get a rash do not put anything on it unless your doctor or nurse says you may. Keep the area around the rash clean and dry. Ask your doctor for medicine if your rash bothers you. . Moisturize your skin several times day. . Avoid sun exposure and apply sunscreen routinely when outdoors. Marland Kitchen Keeping your pain under control is important to your well-being. Please tell your doctor or nurse if you are experiencing pain.  Food and Drug Interactions . There are no known interactions of trametinib with food, however this medication should be taken on an empty stomach. . There are no known interactions of trametinib with other medications. Tell your doctor and pharmacist about all the  prescription and over-the-counter medicines and dietary supplements (vitamins, minerals, herbs and others) that you are taking at this time. Also, check with your  doctor or pharmacist before starting any new prescription or over-the-counter medicines, or dietary supplements to make sure that there are no interactions. . The safety and use of dietary supplements and alternative diets are often not known. Using these might affect your cancer or interfere with your treatment. Until more is known, you should not use dietary supplements or alternative diets without your cancer doctor's help.  When to Call the Doctor Call your doctor or nurse if you have any of these symptoms and/or any new or unusual symptoms: . Fever of 100.5 F (38 C) or higher . Chills . Blurred vision or other changes in eyesight . Headache that does not go away . Dry cough . Feeling that your heart is beating in a fast or not normal way (palpitations) . Chest pain or symptoms of a heart attack. Most heart attacks involve pain in the center of the chest that lasts more than a few minutes. The pain may go away and come back. It can feel like pressure, squeezing, fullness, or pain. Sometimes pain is felt in one or both arms, the back, neck, jaw, or stomach. If any of these symptoms last 2 minutes, call 911. . Your leg or arm is swollen, red, warm and/or painful . Wheezing and/or trouble breathing . Fatigue that interferes with your daily activities . Loose bowel movements (diarrhea) 4 times a day or loose bowel movements with lack of strength or a feeling of being dizzy . Severe abdominal pain that does not go away . Nausea that stops you from eating or drinking or relieved by prescribed medicine . Throwing up more than 3 times a day . Pain in your abdomen that does not go away . Difficulty swallowing . Blood in your urine, vomit (bright red or coffee-ground) and/or stools (bright red, or black/tarry) . Swelling of legs, arms,  and/or face . Decreased urine . Flu-like symptoms: fever, headache, muscle and joint aches, and fatigue (low energy, feeling weak) . A new rash or a rash that is not relieved by prescribed medicines . New skin lesions . Abnormal blood sugar . Pain that does not go away or is not relieved by prescribed medicine . If you think you may be pregnant  Reproduction Warnings . Pregnancy warning: This drug can have harmful effects on the unborn baby. Women of child bearing potential should use effective methods of birth control during your cancer treatment and for at least 4 months after treatment. Effective non-hormonal methods of birth control should be used if taking trametinib in combination with dabrafenib. Birth control pills (oral contraceptives) may not be effective with dabrafenib. Let your doctor know right away if you think you may be pregnant. . Breastfeeding warning: It is not known if this drug passes into breast milk. For this reason, women should not breast feed during treatment or for 4 months after treatment because this drug may enter the breast milk and cause harm to a breast feeding baby. . Fertility warning: In women, this drug may affect your ability to have children in the future. Talk with your doctor or nurse if you plan to have children. Ask for information on egg banking.      SELF CARE ACTIVITIES WHILE ON CHEMOTHERAPY: Hydration Increase your fluid intake 48 hours prior to treatment and drink at least 8 to 12 cups (64 ounces) of water/decaff beverages per day after treatment. You can still have your cup of coffee or soda but these beverages do not count as part of your  8 to 12 cups that you need to drink daily. No alcohol intake.   Medications Continue taking your normal prescription medication as prescribed.  If you start any new herbal or new supplements please let us know first to make sure it is safe.   Mouth Care Have teeth cleaned professionally before starting  treatment. Keep dentures and partial plates clean. Use soft toothbrush and do not use mouthwashes that contain alcohol. Biotene is a good mouthwash that is available at most pharmacies or may be ordered by calling [phone]. Use warm salt water gargles (1 teaspoon salt per 1 quart warm water) before and after meals and at bedtime. Or you may rinse with 2 tablespoons of three-percent hydrogen peroxide mixed in eight ounces of water. If you are still having problems with your mouth or sores in your mouth please call the clinic. If you need dental work, please let the doctor know before you go for your appointment so that we can coordinate the best possible time for you in regards to your chemo regimen. You need to also let your dentist know that you are actively taking chemo. We may need to do labs prior to your dental appointment.    Skin Care Always use sunscreen that has not expired and with SPF (Sun Protection Factor) of 50 or higher. Wear hats to protect your head from the sun. Remember to use sunscreen on your hands, ears, face, & feet.  Use good moisturizing lotions such as udder cream, eucerin, or even Vaseline. Some chemotherapies can cause dry skin, color changes in your skin and nails.      Avoid long, hot showers or baths.  Use gentle, fragrance-free soaps and laundry detergent.  Use moisturizers, preferably creams or ointments rather than lotions because the thicker consistency is better at preventing skin dehydration. Apply the cream or ointment within 15 minutes of showering. Reapply moisturizer at night, and moisturize your hands every time after you wash them.   Hair Loss (if your doctor says your hair will fall out)    If your doctor says that your hair is likely to fall out, decide before you begin chemo whether you want to wear a wig. You may want to shop before treatment to match your hair color.  Hats, turbans, and scarves can also camouflage hair loss, although some people  prefer to leave their heads uncovered. If you go bare-headed outdoors, be sure to use sunscreen on your scalp.  Cut your hair short. It eases the inconvenience of shedding lots of hair, but it also can reduce the emotional impact of watching your hair fall out.  Don't perm or color your hair during chemotherapy. Those chemical treatments are already damaging to hair and can enhance hair loss. Once your chemo treatments are done and your hair has grown back, it's OK to resume dyeing or perming hair. With chemotherapy, hair loss is almost always temporary. But when it grows back, it may be a different color or texture. In older adults who still had hair color before chemotherapy, the new growth may be completely gray.  Often, new hair is very fine and soft.   Infection Prevention Please wash your hands for at least 30 seconds using warm soapy water. Handwashing is the #1 way to prevent the spread of germs. Stay away from sick people or people who are getting over a cold. If you develop respiratory systems such as green/yellow mucus production or productive cough or persistent cough let us know and  we will see if you need an antibiotic. It is a good idea to keep a pair of gloves on when going into grocery stores/[employer] to decrease your risk of coming into contact with germs on the carts, etc. Carry alcohol hand gel with you at all times and use it frequently if out in public. If your temperature reaches 100.5 or higher please call the clinic and let us know.  If it is after hours or on the weekend please go to the ER if your temperature is over 100.5.  Please have your own personal thermometer at home to use.     Sex and bodily fluids If you are going to have sex, a condom must be used to protect the person that isn't taking chemotherapy. Chemo can decrease your libido (sex drive). For a few days after chemotherapy, chemotherapy can be excreted through your bodily fluids.  When using the toilet please close  the lid and flush the toilet twice.  Do this for a few day after you have had chemotherapy.    Effects of chemotherapy on your sex life Some changes are simple and won't last long. They won't affect your sex life permanently. Sometimes you may feel:  too tired  not strong enough to be very active  sick or sore   not in the mood  anxious or low Your anxiety might not seem related to sex. For example, you may be worried about the cancer and how your treatment is going. Or you may be worried about money, or about how you family are coping with your illness. These things can cause stress, which can affect your interest in sex. It's important to talk to your partner about how you feel. Remember - the changes to your sex life don't usually last long. There's usually no medical reason to stop having sex during chemo. The drugs won't have any long term physical effects on your performance or enjoyment of sex. Cancer can't be passed on to your partner during sex   Contraception It's important to use reliable contraception during treatment. Avoid getting pregnant while you or your partner are having chemotherapy. This is because the drugs may harm the baby. Sometimes chemotherapy drugs can leave a man or woman infertile.  This means you would not be able to have children in the future. You might want to talk to someone about permanent infertility. It can be very difficult to learn that you may no longer be able to have children. Some people find counselling helpful. There might be ways to preserve your fertility, although this is easier for men than for women. You may want to speak to a fertility expert. You can talk about sperm banking or harvesting your eggs. You can also ask about other fertility options, such as donor eggs. If you have or have had breast cancer, your doctor might advise you not to take the contraceptive pill. This is because the hormones in it might affect the cancer.  It is not  known for sure whether or not chemotherapy drugs can be passed on through semen or secretions from the vagina. Because of this some doctors advise people to use a barrier method if you have sex during treatment. This applies to vaginal, anal or oral sex. Generally, doctors advise a barrier method only for the time you are actually having the treatment and for about a week after your treatment. Advice like this can be worrying, but this does not mean that you have to avoid  being intimate with your partner. You can still have close contact with your partner and continue to enjoy sex.   Animals If you have cats or birds we just ask that you not change the litter or change the cage.  Please have someone else do this for you while you are on chemotherapy.    Food Safety During and After Cancer Treatment Food safety is important for people both during and after cancer treatment. Cancer and cancer treatments, such as chemotherapy, radiation therapy, and stem cell/bone marrow transplantation, often weaken the immune system. This makes it harder for your body to protect itself from foodborne illness, also called food poisoning. Foodborne illness is caused by eating food that contains harmful bacteria, parasites, or viruses.   Foods to avoid Some foods have a higher risk of becoming tainted with bacteria. These include:  Unwashed fresh fruit and vegetables, especially leafy vegetables that can hide dirt and other contaminants  Raw sprouts, such as alfalfa sprouts  Raw or undercooked beef, especially ground beef, or other raw or undercooked meat and poultry  Fatty, fried, or spicy foods immediately before or after treatment.  These can sit heavy on your stomach and make you feel nauseous.  Raw or undercooked shellfish, such as oysters.  Sushi and sashimi, which often contain raw fish.   Unpasteurized beverages, such as unpasteurized fruit juices, raw milk, raw yogurt, or cider  Undercooked eggs, such  as soft boiled, over easy, and poached; raw, unpasteurized eggs; or foods made with raw egg, such as homemade raw cookie dough and homemade mayonnaise Simple steps for food safety Shop smart.  Do not buy food stored or displayed in an unclean area.  Do not buy bruised or damaged fruits or vegetables.  Do not buy cans that have cracks, dents, or bulges.  Pick up foods that can spoil at the end of your shopping trip and store them in a cooler on the way home. Prepare and clean up foods carefully.  Rinse all fresh fruits and vegetables under running water, and dry them with a clean towel or paper towel.  Clean the top of cans before opening them.  After preparing food, wash your hands for 20 seconds with hot water and soap. Pay special attention to areas between fingers and under nails.  Clean your utensils and dishes with hot water and soap.  Disinfect your kitchen and cutting boards using 1 teaspoon of liquid, unscented bleach mixed into 1 quart of water.   Dispose of old food.  Eat canned and packaged food before its expiration date (the "use by" or "best before" date).  Consume refrigerated leftovers within 3 to 4 days. After that time, throw out the food. Even if the food does not smell or look spoiled, it still may be unsafe. Some bacteria, such as Listeria, can grow even on foods stored in the refrigerator if they are kept for too long. Take precautions when eating out.  At restaurants, avoid buffets and salad bars where food sits out for a long time and comes in contact with many people. Food can become contaminated when someone with a virus, often a norovirus, or another "bug" handles it.  Put any leftover food in a "to-go" container yourself, rather than having the server do it. And, refrigerate leftovers as soon as you get home.  Choose restaurants that are clean and that are willing to prepare your food as you order it cooked.       MEDICATIONS: Zofran/Ondansetron 36m  tablet. Take 1 tablet every 8 hours as needed for nausea/vomiting. (#1 nausea med to take, this can constipate)   Compazine/Prochlorperazine '10mg'$  tablet. Take 1 tablet every 6 hours as needed for nausea/vomiting. (#2 nausea med to take, this can make you sleepy)   Over-the-Counter Meds: Miralax 1 capful in 8 oz of fluid daily. May increase to two times a day if needed. This is a stool softener. If this doesn't work proceed you can add:   Senokot S-start with 1 tablet two times a day and increase to 4 tablets two times a day if needed. (total of 8 tablets in a 24 hour period). This is a stimulant laxative.    Call us if this does not help your bowels move.    Imodium '2mg'$  capsule. Take 2 capsules after the 1st loose stool and then 1 capsule every 2 hours until you go a total of 12 hours without having a loose stool. Call the [facility] if loose stools continue. If diarrhea occurs @ bedtime, take 2 capsules @ bedtime. Then take 2 capsules every 4 hours until morning. Call [facility].       Diarrhea Sheet  If you are having loose stools/diarrhea, please purchase Imodium and begin taking as outlined:  At the first sign of poorly formed or loose stools you should begin taking Imodium(loperamide) 2 mg capsules.  Take two caplets ('4mg'$ ) followed by one caplet ('2mg'$ ) every 2 hours until you have had no diarrhea for 12 hours.  During the night take two caplets ('4mg'$ ) at bedtime and continue every 4 hours during the night until the morning.  Stop taking Imodium only after there is no sign of diarrhea for 12 hours.     Always call the [facility] if you are having loose stools/diarrhea that you can't get under control.  Loose stools/diarrhea leads to dehydration (loss of water) in your body.  We have other options of trying to get the loose stools/diarrhea to stopped but you must let us know!     Constipation Sheet *Miralax in 8 oz of fluid daily.  May increase to two times a day if needed.  This is a  stool softener.  If this not enough to keep your bowel regular:   You can add:   *Senokot S, start with one tablet twice a day and can increase to 4 tablets twice a day if needed.  This is a stimulant laxative.     Sometimes when you take pain medication you need BOTH a medicine to keep your stool soft and a medicine to help your bowel push it out!   Please call if the above does not work for you.    Do not go more than 2 days without a bowel movement.  It is very important that you do not become constipated.  It will make you feel sick to your stomach (nausea) and can cause abdominal pain and vomiting.       Nausea Sheet  Zofran/Ondansetron '8mg'$  tablet. Take 1 tablet every 8 hours as needed for nausea/vomiting. (#1 nausea med to take, this can constipate)   Compazine/Prochlorperazine '10mg'$  tablet. Take 1 tablet every 6 hours as needed for nausea/vomiting. (#2 nausea med to take, this can make you sleepy)   You can take these medications together or separately.  We would first like for you to try the Ondansetron by itself and then take the Prochloperizine if needed. But you are allowed to take both medications at the same time  if your nausea is that severe.  If you are having persistent nausea (nausea that does not stop) please take these medications on a staggered schedule so that the nausea medication stays in your body.  Please call the [facility] and let us know the amount of nausea that you are experiencing.  If you begin to vomit, you need to call the [facility] and if it is the weekend and you have vomited more than one time and cant get it to stop-go to the Emergency Room.  Persistent nausea/vomiting can lead to dehydration (loss of fluid in your body) and will make you feel terrible.    Ice chips, sips of clear liquids, foods that are @ room temperature, crackers, and toast tend to be better tolerated.         SYMPTOMS TO REPORT AS SOON AS POSSIBLE AFTER TREATMENT:  FEVER  GREATER THAN 100.5 F  CHILLS WITH OR WITHOUT FEVER  NAUSEA AND VOMITING THAT IS NOT CONTROLLED WITH YOUR NAUSEA MEDICATION  UNUSUAL SHORTNESS OF BREATH  UNUSUAL BRUISING OR BLEEDING  TENDERNESS IN MOUTH AND THROAT WITH OR WITHOUT PRESENCE OF ULCERS  URINARY PROBLEMS  BOWEL PROBLEMS  UNUSUAL RASH     Wear comfortable clothing and clothing appropriate for easy access to any Portacath or PICC line. Let us know if there is anything that we can do to make your therapy better!       What to do if you need assistance after hours or on the weekends: CALL [phone].  HOLD on the line, do not hang up.  You will hear multiple messages but at the end you will be connected with a nurse triage line.  They will contact the doctor if necessary.  Most of the time they will be able to assist you.  Do not call the hospital operator.          I have been informed and understand all of the instructions given to me and have received a copy. I have been instructed to call the clinic [phone] or my family physician as soon as possible for continued medical care, if indicated. I do not have any more questions at this time but understand that I may call the [facility] or the Patient Navigator at [phone] during office hours should I have questions or need assistance in obtaining follow-up care.

## 2017-09-28 ENCOUNTER — Inpatient Hospital Stay (HOSPITAL_COMMUNITY): Payer: Medicare Other

## 2017-09-28 NOTE — Progress Notes (Signed)
Chemotherapy teaching completed.  Extensive teaching packet given.  Consent signed.

## 2017-10-03 ENCOUNTER — Telehealth (HOSPITAL_COMMUNITY): Payer: Self-pay | Admitting: Hematology

## 2017-10-03 NOTE — Telephone Encounter (Signed)
PT WAS APPROVED TO GET FREE MEKINIST AND TAFINLAR FROM [facility] TIL 05/16/2018. [phone]  PT ID [id number]

## 2017-10-05 ENCOUNTER — Telehealth (HOSPITAL_COMMUNITY): Payer: Self-pay | Admitting: Emergency Medicine

## 2017-10-05 NOTE — Telephone Encounter (Signed)
Pt will receive his taflinar and mekinist on 10/06/2017 and he will start taking the medication on 10/07/2017.  He explained how to take the medication to me.  He will call if he has any problems.

## 2017-10-17 ENCOUNTER — Inpatient Hospital Stay (HOSPITAL_COMMUNITY): Payer: Medicare Other | Attending: Hematology

## 2017-10-17 DIAGNOSIS — C3490 Malignant neoplasm of unspecified part of unspecified bronchus or lung: Secondary | ICD-10-CM | POA: Diagnosis not present

## 2017-10-17 DIAGNOSIS — Z87891 Personal history of nicotine dependence: Secondary | ICD-10-CM | POA: Insufficient documentation

## 2017-10-17 DIAGNOSIS — N189 Chronic kidney disease, unspecified: Secondary | ICD-10-CM | POA: Insufficient documentation

## 2017-10-17 DIAGNOSIS — I129 Hypertensive chronic kidney disease with stage 1 through stage 4 chronic kidney disease, or unspecified chronic kidney disease: Secondary | ICD-10-CM | POA: Insufficient documentation

## 2017-10-17 LAB — CBC WITH DIFFERENTIAL/PLATELET
BASOS ABS: 0 10*3/uL (ref 0.0–0.1)
BASOS PCT: 0 %
EOS ABS: 0.1 10*3/uL (ref 0.0–0.7)
Eosinophils Relative: 1 %
HEMATOCRIT: 40.6 % (ref 39.0–52.0)
Hemoglobin: 13.7 g/dL (ref 13.0–17.0)
Lymphocytes Relative: 28 %
Lymphs Abs: 2.2 10*3/uL (ref 0.7–4.0)
MCH: 27.3 pg (ref 26.0–34.0)
MCHC: 33.7 g/dL (ref 30.0–36.0)
MCV: 80.9 fL (ref 78.0–100.0)
MONO ABS: 0.5 10*3/uL (ref 0.1–1.0)
MONOS PCT: 6 %
NEUTROS ABS: 5.2 10*3/uL (ref 1.7–7.7)
Neutrophils Relative %: 65 %
Platelets: 204 10*3/uL (ref 150–400)
RBC: 5.02 MIL/uL (ref 4.22–5.81)
RDW: 13.9 % (ref 11.5–15.5)
WBC: 8 10*3/uL (ref 4.0–10.5)

## 2017-10-17 LAB — COMPREHENSIVE METABOLIC PANEL
ALBUMIN: 3.7 g/dL (ref 3.5–5.0)
ALT: 14 U/L — ABNORMAL LOW (ref 17–63)
ANION GAP: 6 (ref 5–15)
AST: 19 U/L (ref 15–41)
Alkaline Phosphatase: 102 U/L (ref 38–126)
BUN: 18 mg/dL (ref 6–20)
CHLORIDE: 102 mmol/L (ref 101–111)
CO2: 27 mmol/L (ref 22–32)
Calcium: 9 mg/dL (ref 8.9–10.3)
Creatinine, Ser: 1.72 mg/dL — ABNORMAL HIGH (ref 0.61–1.24)
GFR calc Af Amer: 42 mL/min — ABNORMAL LOW (ref >60)
GFR calc non Af Amer: 36 mL/min — ABNORMAL LOW (ref >60)
GLUCOSE: 163 mg/dL — AB (ref 65–99)
POTASSIUM: 4.6 mmol/L (ref 3.5–5.1)
Sodium: 135 mmol/L (ref 135–145)
TOTAL PROTEIN: 7.2 g/dL (ref 6.5–8.1)
Total Bilirubin: 0.7 mg/dL (ref 0.3–1.2)

## 2017-10-17 LAB — MAGNESIUM: Magnesium: 1.8 mg/dL (ref 1.7–2.4)

## 2017-10-18 ENCOUNTER — Other Ambulatory Visit: Payer: Self-pay

## 2017-10-18 ENCOUNTER — Inpatient Hospital Stay (HOSPITAL_BASED_OUTPATIENT_CLINIC_OR_DEPARTMENT_OTHER): Payer: Medicare Other | Admitting: Hematology

## 2017-10-18 ENCOUNTER — Encounter (HOSPITAL_COMMUNITY): Payer: Self-pay | Admitting: Hematology

## 2017-10-18 VITALS — BP 194/65 | HR 45 | Temp 97.5°F | Resp 18 | Wt 195.0 lb

## 2017-10-18 DIAGNOSIS — C3490 Malignant neoplasm of unspecified part of unspecified bronchus or lung: Secondary | ICD-10-CM | POA: Diagnosis not present

## 2017-10-18 DIAGNOSIS — N189 Chronic kidney disease, unspecified: Secondary | ICD-10-CM

## 2017-10-18 DIAGNOSIS — Z87891 Personal history of nicotine dependence: Secondary | ICD-10-CM

## 2017-10-18 DIAGNOSIS — I129 Hypertensive chronic kidney disease with stage 1 through stage 4 chronic kidney disease, or unspecified chronic kidney disease: Secondary | ICD-10-CM

## 2017-10-18 DIAGNOSIS — I10 Essential (primary) hypertension: Secondary | ICD-10-CM

## 2017-10-18 MED ORDER — AMLODIPINE BESYLATE 10 MG PO TABS: 10.0000 mg | ORAL_TABLET | Freq: Every day | ORAL | 3 refills | Status: DC

## 2017-10-18 NOTE — Assessment & Plan Note (Signed)
1.  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  He has multiple bilateral lung nodules making him metastatic.  He had a colonoscopy which showed multiple polyps which were tubular adenomas.  His functional status is decent at this time.  He does not have any malignancy related symptoms at this time.   -Dabrafenib 100 mg twice daily and trametinib 1.5 mg once daily (response rates of 65% and median PFS of 10.9 months) on an empty stomach was started on 10/07/2017.  He was taking trametinib 3 times a day.  I have told him to take all 3 tablets at one time in the morning.  He did not experience any side effects of 4.  He reports having improved energy levels in the last couple of weeks.  His blood pressure is slightly worse from last visit (systolic 364 at last visit, increased to 194 today).  I have evaluated his medications.  He is on metoprolol 100 mg twice daily.  As dabrafenib and trametinib can worsen the blood pressure, I will add Norvasc 10 mg daily.  I will send a prescription for it.  I plan to repeat echocardiogram in 2 weeks prior to his next visit.  His EKG on 06/17/2017 shows QTC of 392 ms.  Last echocardiogram on 06/29/2017 shows EF of 60 to 65%.  He has not developed any skin rashes or GI toxicity.

## 2017-10-18 NOTE — Progress Notes (Signed)
[name], [name] [id number]   CLINIC:  Medical Oncology/Hematology  PCP:  Anthony Noble, MD [address] [place] [address] [phone]   REASON FOR VISIT:  Follow-up for metastatic non-small cell lung cancer.  CURRENT THERAPY: Dabrafenib and trametinib started on 10/07/2017.   INTERVAL HISTORY:  [name] 77 y.o. male returns for follow-up of his lung cancer.  He received pills and started them on 10/07/2017.  He is taking dabrafenib 2 pills at 6 AM and 6 PM on empty stomach.  He is taking trametinib 1 pill 3 times a day.  He denies any fevers.  He denies any GI toxicity including nausea, vomiting or diarrhea.  No skin rashes was reported.  Blood pressure recorded in our office was high at 194.  He usually runs high in the 180s.  He denies any chest pains or lightheadedness.  No ankle swellings were reported.  Appetite has been great.  He reports increase in energy for the last 2 weeks.    REVIEW OF SYSTEMS:  Review of Systems  All other systems reviewed and are negative.    PAST MEDICAL/SURGICAL HISTORY:  Past Medical History:  Diagnosis Date  . Chronic kidney disease   . Coronary artery disease   . Diabetes mellitus   . Hypertension   . 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 Spontaneous pneumothorax 09/15/2010   right   Past Surgical History:  Procedure Laterality Date  . ANKLE FRACTURE SURGERY  2008   [facility]  . APPENDECTOMY  1960's  . CHEST TUBE INSERTION Right 09/15/2010   Dr Servando Snare - spontaneous PTX  . CHOLECYSTECTOMY  2008  . COLONOSCOPY N/A 09/07/2017   Procedure: COLONOSCOPY;  Surgeon: Daneil Dolin, MD;  Location: [facility];  Service: Endoscopy;  Laterality: N/A;  10:30am  . CORONARY ARTERY BYPASS GRAFT  2003  . EYE SURGERY  2001   Cataracts removed bilaterally  . INCISIONAL HERNIA  REPAIR N/A 01/29/2015   Procedure: Fatima Blank HERNIORRHAPHY WITH MESH;  Surgeon: Aviva Signs, MD;  Location: [facility] ORS;  Service: General;  Laterality: N/A;  . INSERTION OF MESH N/A 01/29/2015   Procedure: INSERTION OF MESH;  Surgeon: Aviva Signs, MD;  Location: [facility] ORS;  Service: General;  Laterality: N/A;  . [place]  . LEFT HEART CATH AND CORS/GRAFTS ANGIOGRAPHY N/A 06/20/2017   Procedure: LEFT HEART CATH AND CORS/GRAFTS ANGIOGRAPHY;  Surgeon: Martinique, Peter M, MD;  Location: [facility];  Service: Cardiovascular;  Laterality: N/A;  . ORIF ANKLE FRACTURE  08/26/2011   Procedure: OPEN REDUCTION INTERNAL FIXATION (ORIF) ANKLE FRACTURE;  Surgeon: Sanjuana Kava, MD;  Location: [facility];  Service: Orthopedics;  Laterality: Right;  . POLYPECTOMY  09/07/2017   Procedure: POLYPECTOMY;  Surgeon: Daneil Dolin, MD;  Location: [facility];  Service: Endoscopy;;  ascending x2 (cold snare)  . PROSTATE SURGERY  2012   Enlarged prostate  . TRANSURETHRAL RESECTION OF PROSTATE  2012     SOCIAL HISTORY:  Social History   Socioeconomic History  . Marital status: Married    Spouse name: Not on file  . Number of children: Not on file  . Years of education: Not on file  . Highest education level: Not on file  Occupational History  . Not  on file  Social Needs  . Financial resource strain: Not on file  . Food insecurity:    Worry: Not on file    Inability: Not on file  . Transportation needs:    Medical: Not on file    Non-medical: Not on file  Tobacco Use  . Smoking status: Former Smoker    Packs/day: 1.00    Years: 33.00    Pack years: 33.00    Last attempt to quit: 05/17/1987    Years since quitting: 30.4  . Smokeless tobacco: Never Used  Substance and Sexual Activity  . Alcohol use: No  . Drug use: No  . Sexual activity: Yes  Lifestyle  . Physical activity:    Days per week: Not on file    Minutes per session: Not on file  . Stress: Not on file    Relationships  . Social connections:    Talks on phone: Not on file    Gets together: Not on file    Attends religious service: Not on file    Active member of club or organization: Not on file    Attends meetings of clubs or organizations: Not on file    Relationship status: Not on file  . Intimate partner violence:    Fear of current or ex partner: Not on file    Emotionally abused: Not on file    Physically abused: Not on file    Forced sexual activity: Not on file  Other Topics Concern  . Not on file  Social History Narrative  . Not on file    FAMILY HISTORY:  Family History  Problem Relation Age of Onset  . Aneurysm Mother   . Heart attack Father   . Colon cancer Maternal Uncle   . Cancer Brother   . Gastric cancer Neg Hx   . Esophageal cancer Neg Hx     CURRENT MEDICATIONS:  Outpatient Encounter Medications as of 10/18/2017  Medication Sig  . aspirin EC 81 MG tablet Take 81 mg by mouth daily.  . Cholecalciferol (VITAMIN D3) 5000 units CAPS Take 5,000 Units by mouth daily.  Marland Kitchen dabrafenib mesylate (TAFLINAR) 50 MG capsule Take 2 capsules (100 mg total) by mouth 2 (two) times daily. Take on an empty stomach 1 hour before or 2 hours after meals.  Marland Kitchen glimepiride (AMARYL) 2 MG tablet Take 2 mg by mouth daily with breakfast.   . metFORMIN (GLUCOPHAGE) 500 MG tablet Take 1 tablet (500 mg total) by mouth 2 (two) times daily with a meal.  . metoprolol (LOPRESSOR) 100 MG tablet Take 100 mg by mouth 2 (two) times daily.  . nitroGLYCERIN (NITROSTAT) 0.4 MG SL tablet Place 0.4 mg under the tongue every 5 (five) minutes as needed for chest pain.   Marland Kitchen ondansetron (ZOFRAN) 8 MG tablet Take 1 tablet (8 mg total) by mouth every 8 (eight) hours as needed for nausea or vomiting.  . pioglitazone (ACTOS) 45 MG tablet Take 45 mg by mouth daily.  . prochlorperazine (COMPAZINE) 10 MG tablet Take 1 tablet (10 mg total) by mouth every 6 (six) hours as needed for nausea or vomiting.  . sitaGLIPtin  (JANUVIA) 50 MG tablet Take 50 mg by mouth daily.   . trametinib dimethyl sulfoxide (MEKINIST) 0.5 MG tablet Take 3 tablets (1.5 mg total) by mouth daily. Take 1 hour before or 2 hours after a meal. Store refrigerated in original container.  Marland Kitchen amLODipine (NORVASC) 10 MG tablet Take 1 tablet (10 mg total) by  mouth daily.  . isosorbide mononitrate (IMDUR) 30 MG 24 hr tablet Take 1 tablet (30 mg total) by mouth daily.   No facility-administered encounter medications on file as of 10/18/2017.     ALLERGIES:  No Known Allergies   PHYSICAL EXAM:  ECOG Performance status: 1  Vitals:   10/18/17 1136  BP: (!) 194/65  Pulse: (!) 45  Resp: 18  Temp: (!) 97.5 F (36.4 C)  SpO2: 98%   Filed Weights   10/18/17 1136  Weight: 195 lb (88.5 kg)    Physical Exam  HEENT: No thrush or mucositis. Chest: Bilateral clear to auscultation. CVS: S1-S2 regular rate and rhythm. Extremities: Trace edema in the left ankle, no edema on the right ankle. LABORATORY DATA:  I have reviewed the labs as listed.  CBC    Component Value Date/Time   WBC 8.0 10/17/2017 1238   RBC 5.02 10/17/2017 1238   HGB 13.7 10/17/2017 1238   HCT 40.6 10/17/2017 1238   PLT 204 10/17/2017 1238   MCV 80.9 10/17/2017 1238   MCH 27.3 10/17/2017 1238   MCHC 33.7 10/17/2017 1238   RDW 13.9 10/17/2017 1238   LYMPHSABS 2.2 10/17/2017 1238   MONOABS 0.5 10/17/2017 1238   EOSABS 0.1 10/17/2017 1238   BASOSABS 0.0 10/17/2017 1238   CMP Latest Ref Rng & Units 10/17/2017 09/01/2017 06/17/2017  Glucose 65 - 99 mg/dL 163(H) - 179(H)  BUN 6 - 20 mg/dL 18 - 24(H)  Creatinine 0.61 - 1.24 mg/dL 1.72(H) 1.80(H) 1.61(H)  Sodium 135 - 145 mmol/L 135 - 133(L)  Potassium 3.5 - 5.1 mmol/L 4.6 - 4.8  Chloride 101 - 111 mmol/L 102 - 97(L)  CO2 22 - 32 mmol/L 27 - 27  Calcium 8.9 - 10.3 mg/dL 9.0 - 9.4  Total Protein 6.5 - 8.1 g/dL 7.2 - -  Total Bilirubin 0.3 - 1.2 mg/dL 0.7 - -  Alkaline Phos 38 - 126 U/L 102 - -  AST 15 - 41 U/L 19 - -    ALT 17 - 63 U/L 14(L) - -     ASSESSMENT & PLAN:   Adenocarcinoma of lung, stage 4 (HCC) 1.  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  He has multiple bilateral lung nodules making him metastatic.  He had a colonoscopy which showed multiple polyps which were tubular adenomas.  His functional status is decent at this time.  He does not have any malignancy related symptoms at this time.   -Dabrafenib 100 mg twice daily and trametinib 1.5 mg once daily (response rates of 65% and median PFS of 10.9 months) on an empty stomach was started on 10/07/2017.  He was taking trametinib 3 times a day.  I have told him to take all 3 tablets at one time in the morning.  He did not experience any side effects of 4.  He reports having improved energy levels in the last couple of weeks.  His blood pressure is slightly worse from last visit (systolic 785 at last visit, increased to 194 today).  I have evaluated his medications.  He is on metoprolol 100 mg twice daily.  As dabrafenib and trametinib can worsen the blood pressure, I will add Norvasc 10 mg daily.  I will send a prescription for it.  I plan to repeat echocardiogram in 2 weeks prior to  his next visit.  His EKG on 06/17/2017 shows QTC of 392 ms.  Last echocardiogram on 06/29/2017 shows EF of 60 to 65%.  He has not developed any skin rashes or GI toxicity.        Orders placed this encounter:  Orders Placed This Encounter  Procedures  . CBC with Differential  . Comprehensive metabolic panel  . Magnesium  . ECHOCARDIOGRAM COMPLETE      Derek Jack, [place] [phone]

## 2017-10-19 ENCOUNTER — Encounter: Payer: Self-pay | Admitting: Cardiovascular Disease

## 2017-10-19 ENCOUNTER — Ambulatory Visit: Payer: Medicare Other | Admitting: Cardiovascular Disease

## 2017-10-19 VITALS — BP 160/80 | HR 56 | Ht 70.0 in | Wt 195.0 lb

## 2017-10-19 DIAGNOSIS — I25708 Atherosclerosis of coronary artery bypass graft(s), unspecified, with other forms of angina pectoris: Secondary | ICD-10-CM | POA: Diagnosis not present

## 2017-10-19 DIAGNOSIS — N183 Chronic kidney disease, stage 3 unspecified: Secondary | ICD-10-CM

## 2017-10-19 DIAGNOSIS — I10 Essential (primary) hypertension: Secondary | ICD-10-CM

## 2017-10-19 DIAGNOSIS — C3490 Malignant neoplasm of unspecified part of unspecified bronchus or lung: Secondary | ICD-10-CM | POA: Diagnosis not present

## 2017-10-19 NOTE — Patient Instructions (Signed)
Medication Instructions:  Your physician recommends that you continue on your current medications as directed. Please refer to the Current Medication list given to you today.   Labwork: NONE  Testing/Procedures: NONE  Follow-Up: Your physician recommends that you schedule a follow-up appointment in: 3 MONTHS    Any Other Special Instructions Will Be Listed Below (If Applicable).     If you need a refill on your cardiac medications before your next appointment, please call your pharmacy.   

## 2017-10-19 NOTE — Progress Notes (Signed)
SUBJECTIVE: The patient presents for follow-up of coronary artery disease. He underwent coronary angiography in February 2019 which demonstrated severe native three-vessel obstructive coronary disease, patent LIMA to the LAD, and severe SVG disease with a 99% proximal SVG stenosis to the diagonal with TIMI I flow and multiple high-grade lesions in the SVG to the OM and SVG to the RCA.  Dr. Martinique recommended consideration for redo CABG. He was evaluated by Dr. Roxy Manns for this.  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  Echocardiogram on 06/29/17 demonstrated normal left ventricular systolic function and regional wall motion, LVEF 76-72%, grade 1 diastolic dysfunction, and mild mitral and tricuspid regurgitation.  He said he is doing well overall.  He denies chest pain.  He began oral chemotherapy on 10/07/2017.    He enjoys working with his hands and working in his shop in his backyard.  He does some mechanical work and Lobbyist.  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  He has 7th grade education because he had a dropout as his father got sick and he had to take care of the farm.  He used to read his sisters' books and study.  Blood pressure yesterday in the oncology office was 194/65.  His oncologist added amlodipine 10 mg daily as both dabrafenib and trametinib both cause hypertension.  An echocardiogram has been scheduled to be performed in 2 weeks.       Review of Systems: As per "subjective", otherwise negative.  No Known Allergies  Current Outpatient Medications  Medication Sig Dispense Refill  . amLODipine (NORVASC) 10 MG tablet Take 1 tablet (10 mg total) by mouth daily. 30 tablet 3  . aspirin EC 81 MG tablet Take 81 mg by mouth daily.    . Cholecalciferol (VITAMIN D3) 5000 units CAPS Take 5,000 Units by mouth daily.    Marland Kitchen dabrafenib mesylate (TAFLINAR) 50 MG capsule Take 2  capsules (100 mg total) by mouth 2 (two) times daily. Take on an empty stomach 1 hour before or 2 hours after meals. 120 capsule 0  . glimepiride (AMARYL) 2 MG tablet Take 2 mg by mouth daily with breakfast.     . isosorbide mononitrate (IMDUR) 30 MG 24 hr tablet Take 1 tablet (30 mg total) by mouth daily. 90 tablet 3  . metFORMIN (GLUCOPHAGE) 500 MG tablet Take 1 tablet (500 mg total) by mouth 2 (two) times daily with a meal.    . metoprolol (LOPRESSOR) 100 MG tablet Take 100 mg by mouth 2 (two) times daily.    . nitroGLYCERIN (NITROSTAT) 0.4 MG SL tablet Place 0.4 mg under the tongue every 5 (five) minutes as needed for chest pain.     Marland Kitchen ondansetron (ZOFRAN) 8 MG tablet Take 1 tablet (8 mg total) by mouth every 8 (eight) hours as needed for nausea or vomiting. 30 tablet 2  . pioglitazone (ACTOS) 45 MG tablet Take 45 mg by mouth daily.    . prochlorperazine (COMPAZINE) 10 MG tablet Take 1 tablet (10 mg total) by mouth every 6 (six) hours as needed for nausea or vomiting. 30 tablet 2  . sitaGLIPtin (JANUVIA) 50 MG tablet Take 50 mg by mouth daily.     . trametinib dimethyl sulfoxide (MEKINIST) 0.5 MG tablet Take 3 tablets (1.5 mg total) by mouth daily. Take 1 hour before or 2 hours after a meal. Store  refrigerated in original container. 90 tablet 0   No current facility-administered medications for this visit.     Past Medical History:  Diagnosis Date  . Chronic kidney disease   . Coronary artery disease   . Diabetes mellitus   . Hypertension   . 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 Spontaneous pneumothorax 09/15/2010   right    Past Surgical History:  Procedure Laterality Date  . ANKLE FRACTURE SURGERY  2008   [facility]  . APPENDECTOMY  1960's  . CHEST TUBE INSERTION Right 09/15/2010   Dr Servando Snare - spontaneous PTX  . CHOLECYSTECTOMY  2008  . COLONOSCOPY N/A 09/07/2017    Procedure: COLONOSCOPY;  Surgeon: Daneil Dolin, MD;  [facility];  Service: Endoscopy;  Laterality: N/A;  10:30am  . CORONARY ARTERY BYPASS GRAFT  2003  . EYE SURGERY  2001   Cataracts removed bilaterally  . INCISIONAL HERNIA REPAIR N/A 01/29/2015   Procedure: Fatima Blank HERNIORRHAPHY WITH MESH;  Surgeon: Aviva Signs, MD;  Location: [facility];  Service: General;  Laterality: N/A;  . INSERTION OF MESH N/A 01/29/2015   Procedure: INSERTION OF MESH;  Surgeon: Aviva Signs, MD;  Location: [facility];  Service: General;  Laterality: N/A;  . [place]  . LEFT HEART CATH AND CORS/GRAFTS ANGIOGRAPHY N/A 06/20/2017   Procedure: LEFT HEART CATH AND CORS/GRAFTS ANGIOGRAPHY;  Surgeon: Martinique, Peter M, MD;  Location: [facility];  Service: Cardiovascular;  Laterality: N/A;  . ORIF ANKLE FRACTURE  08/26/2011   Procedure: OPEN REDUCTION INTERNAL FIXATION (ORIF) ANKLE FRACTURE;  Surgeon: Sanjuana Kava, MD;  Location: [facility];  Service: Orthopedics;  Laterality: Right;  . POLYPECTOMY  09/07/2017   Procedure: POLYPECTOMY;  Surgeon: Daneil Dolin, MD;  [facility];  Service: Endoscopy;;  ascending x2 (cold snare)  . PROSTATE SURGERY  2012   Enlarged prostate  . TRANSURETHRAL RESECTION OF PROSTATE  2012    Social History   Socioeconomic History  . Marital status: Married    Spouse name: Not on file  . Number of children: Not on file  . Years of education: Not on file  . Highest education level: Not on file  Occupational History  . Not on file  Social Needs  . Financial resource strain: Not on file  . Food insecurity:    Worry: Not on file    Inability: Not on file  . Transportation needs:    Medical: Not on file    Non-medical: Not on file  Tobacco Use  . Smoking status: Former Smoker    Packs/day: 1.00    Years: 33.00    Pack years: 33.00    Last attempt to quit: 05/17/1987    Years since quitting: 30.4  . Smokeless tobacco: Never  Used  Substance and Sexual Activity  . Alcohol use: No  . Drug use: No  . Sexual activity: Yes  Lifestyle  . Physical activity:    Days per week: Not on file    Minutes per session: Not on file  . Stress: Not on file  Relationships  . Social connections:    Talks on phone: Not on file    Gets together: Not on file    Attends religious service: Not on file    Active member of club or  organization: Not on file    Attends meetings of clubs or organizations: Not on file    Relationship status: Not on file  . Intimate partner violence:    Fear of current or ex partner: Not on file    Emotionally abused: Not on file    Physically abused: Not on file    Forced sexual activity: Not on file  Other Topics Concern  . Not on file  Social History Narrative  . Not on file     Vitals:   10/19/17 0817  BP: (!) 160/80  Pulse: (!) 56  SpO2: 97%  Weight: 195 lb (88.5 kg)  Height: 5\' 10"  (1.778 m)    Wt Readings from Last 3 Encounters:  10/19/17 195 lb (88.5 kg)  10/18/17 195 lb (88.5 kg)  09/26/17 197 lb 11.2 oz (89.7 kg)     PHYSICAL EXAM General: NAD HEENT: Normal. Neck: No JVD, no thyromegaly. Lungs: Diminished air movement throughout with bilateral faint inspiratory and expiratory wheezes.  No crackles. CV: Regular rate and rhythm, normal S1/S2, no S3/S4, no murmur. No pretibial or periankle edema.    Abdomen: Soft, nontender, no distention.  Neurologic: Alert and oriented.  Psych: Normal affect. Skin: Normal. Musculoskeletal: No gross deformities.    ECG: Most recent ECG reviewed.   Labs: Lab Results  Component Value Date/Time   K 4.6 10/17/2017 12:38 PM   BUN 18 10/17/2017 12:38 PM   CREATININE 1.72 (H) 10/17/2017 12:38 PM   ALT 14 (L) 10/17/2017 12:38 PM   HGB 13.7 10/17/2017 12:38 PM     Lipids: No results found for: LDLCALC, LDLDIRECT, CHOL, TRIG, HDL     ASSESSMENT AND PLAN: 1.  Coronary artery disease with history of four-vessel CABG and severe  SVG disease with concomitant angina pectoris: Symptomatically stable.  Continue aspirin, Lipitor, Imdur 30 mg, and metoprolol.  Dr. Martinique recommended redo CABG but is deemed not to be an operative candidate given his metastatic adenocarcinoma of the lung.  He will be medically managed. Echocardiogram planned by oncology in 2 weeks.  2.  Hypertension: Blood pressure is elevated.  He developed hyperkalemia with ACE inhibitors in the past.  Blood pressure yesterday in the oncology office was 194/65.  His oncologist added amlodipine 10 mg daily as both dabrafenib and trametinib both cause hypertension.   3.  Chronic kidney disease stage III: BUN 218, creatinine 1.72 on 10/17/17.  This has remained stable.  4.  Stage IV metastatic adenocarcinoma of the lung: Follows with oncology.    Disposition: Follow up 3 months   Kate Sable, M.D., F.A.C.C.

## 2017-10-28 ENCOUNTER — Inpatient Hospital Stay (HOSPITAL_COMMUNITY): Payer: Medicare Other

## 2017-10-28 ENCOUNTER — Ambulatory Visit (HOSPITAL_COMMUNITY)
Admission: RE | Admit: 2017-10-28 | Discharge: 2017-10-28 | Disposition: A | Discharge status: Home / Self Care | Payer: Medicare Other | Source: Ambulatory Visit | Attending: Hematology | Admitting: Hematology

## 2017-10-28 DIAGNOSIS — C3490 Malignant neoplasm of unspecified part of unspecified bronchus or lung: Secondary | ICD-10-CM | POA: Insufficient documentation

## 2017-10-28 DIAGNOSIS — N189 Chronic kidney disease, unspecified: Secondary | ICD-10-CM | POA: Diagnosis not present

## 2017-10-28 DIAGNOSIS — E119 Type 2 diabetes mellitus without complications: Secondary | ICD-10-CM | POA: Insufficient documentation

## 2017-10-28 DIAGNOSIS — Z951 Presence of aortocoronary bypass graft: Secondary | ICD-10-CM | POA: Diagnosis not present

## 2017-10-28 DIAGNOSIS — I081 Rheumatic disorders of both mitral and tricuspid valves: Secondary | ICD-10-CM | POA: Insufficient documentation

## 2017-10-28 DIAGNOSIS — Z87891 Personal history of nicotine dependence: Secondary | ICD-10-CM | POA: Diagnosis not present

## 2017-10-28 DIAGNOSIS — I129 Hypertensive chronic kidney disease with stage 1 through stage 4 chronic kidney disease, or unspecified chronic kidney disease: Secondary | ICD-10-CM | POA: Diagnosis not present

## 2017-10-28 LAB — COMPREHENSIVE METABOLIC PANEL
ALT: 16 U/L — ABNORMAL LOW (ref 17–63)
ANION GAP: 7 (ref 5–15)
AST: 21 U/L (ref 15–41)
Albumin: 3.6 g/dL (ref 3.5–5.0)
Alkaline Phosphatase: 103 U/L (ref 38–126)
BUN: 19 mg/dL (ref 6–20)
CALCIUM: 9 mg/dL (ref 8.9–10.3)
CHLORIDE: 100 mmol/L — AB (ref 101–111)
CO2: 28 mmol/L (ref 22–32)
Creatinine, Ser: 1.71 mg/dL — ABNORMAL HIGH (ref 0.61–1.24)
GFR calc Af Amer: 42 mL/min — ABNORMAL LOW (ref >60)
GFR calc non Af Amer: 37 mL/min — ABNORMAL LOW (ref >60)
Glucose, Bld: 215 mg/dL — ABNORMAL HIGH (ref 65–99)
Potassium: 4.9 mmol/L (ref 3.5–5.1)
SODIUM: 135 mmol/L (ref 135–145)
Total Bilirubin: 0.8 mg/dL (ref 0.3–1.2)
Total Protein: 7.3 g/dL (ref 6.5–8.1)

## 2017-10-28 LAB — CBC WITH DIFFERENTIAL/PLATELET
Basophils Absolute: 0 10*3/uL (ref 0.0–0.1)
Basophils Relative: 0 %
EOS ABS: 0.1 10*3/uL (ref 0.0–0.7)
EOS PCT: 1 %
HCT: 41 % (ref 39.0–52.0)
Hemoglobin: 13.6 g/dL (ref 13.0–17.0)
Lymphocytes Relative: 24 %
Lymphs Abs: 1.4 10*3/uL (ref 0.7–4.0)
MCH: 27 pg (ref 26.0–34.0)
MCHC: 33.2 g/dL (ref 30.0–36.0)
MCV: 81.5 fL (ref 78.0–100.0)
MONO ABS: 0.4 10*3/uL (ref 0.1–1.0)
MONOS PCT: 7 %
Neutro Abs: 3.9 10*3/uL (ref 1.7–7.7)
Neutrophils Relative %: 68 %
PLATELETS: 139 10*3/uL — AB (ref 150–400)
RBC: 5.03 MIL/uL (ref 4.22–5.81)
RDW: 14.3 % (ref 11.5–15.5)
WBC: 5.8 10*3/uL (ref 4.0–10.5)

## 2017-10-28 LAB — MAGNESIUM: MAGNESIUM: 1.8 mg/dL (ref 1.7–2.4)

## 2017-10-28 NOTE — Progress Notes (Signed)
*  PRELIMINARY RESULTS* Echocardiogram 2D Echocardiogram has been performed.  Leavy Cella 10/28/2017, 11:45 AM

## 2017-11-01 ENCOUNTER — Encounter (HOSPITAL_COMMUNITY): Payer: Self-pay | Admitting: Hematology

## 2017-11-01 ENCOUNTER — Inpatient Hospital Stay (HOSPITAL_COMMUNITY): Payer: Medicare Other | Attending: Hematology | Admitting: Hematology

## 2017-11-01 ENCOUNTER — Other Ambulatory Visit: Payer: Self-pay

## 2017-11-01 VITALS — BP 126/53 | HR 57 | Temp 98.6°F | Resp 18 | Wt 193.6 lb

## 2017-11-01 DIAGNOSIS — C3431 Malignant neoplasm of lower lobe, right bronchus or lung: Secondary | ICD-10-CM | POA: Diagnosis not present

## 2017-11-01 DIAGNOSIS — C3490 Malignant neoplasm of unspecified part of unspecified bronchus or lung: Secondary | ICD-10-CM

## 2017-11-01 DIAGNOSIS — E1122 Type 2 diabetes mellitus with diabetic chronic kidney disease: Secondary | ICD-10-CM

## 2017-11-01 DIAGNOSIS — N189 Chronic kidney disease, unspecified: Secondary | ICD-10-CM | POA: Diagnosis not present

## 2017-11-01 DIAGNOSIS — Z87891 Personal history of nicotine dependence: Secondary | ICD-10-CM | POA: Diagnosis not present

## 2017-11-01 DIAGNOSIS — I129 Hypertensive chronic kidney disease with stage 1 through stage 4 chronic kidney disease, or unspecified chronic kidney disease: Secondary | ICD-10-CM

## 2017-11-01 NOTE — Assessment & Plan Note (Signed)
1.  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  He has multiple bilateral lung nodules making him metastatic.  He had a colonoscopy which showed multiple polyps which were tubular adenomas.  His functional status is decent at this time.  He does not have any malignancy related symptoms at this time.   -Dabrafenib 100 mg twice daily and trametinib 1.5 mg once daily (response rates of 65% and median PFS of 10.9 months) on an empty stomach was started on 10/07/2017.  He is not having any problems.  Upon further questioning and confirmation, we found out that he is taking dabrafenib 50 mg twice daily.  I have told him to increase it to 100 mg twice daily.  His blood counts today are within normal limits.  I have discussed the results of 2D echocardiogram dated 10/28/2017 which shows ejection fraction of 60%.  I plan to repeat echocardiogram in 2 to 3 months.  He will be seen back in 2 weeks for follow-up.  2.  Hypertension: He is taking metoprolol 100 mg twice daily.  At last visit on 10/18/2017, I have added Norvasc 10 mg daily.  His blood pressure today is very well controlled.

## 2017-11-01 NOTE — Progress Notes (Signed)
[address] [place], [place] [id number]   CLINIC:  Medical Oncology/Hematology  PCP:  [name], [place] [address] [phone]   REASON FOR VISIT:  Follow-up for metastatic lung cancer.  CURRENT THERAPY: Dabrafenib and trametinib.   INTERVAL HISTORY:  [name] 77 y.o. male returns for follow-up of non-small cell lung cancer.  He is taking dabrafenib and trametinib on empty stomach.  He denies any nausea, vomiting, diarrhea or constipation.  No fevers reported.  No skin rashes reported.  His blood pressure is very well controlled since we started him on Norvasc 10 mg daily.  Energy levels are 75%.  Appetite is 100%.   REVIEW OF SYSTEMS:  Review of Systems  Constitutional: Positive for fatigue.  All other systems reviewed and are negative.    PAST MEDICAL/SURGICAL HISTORY:  Past Medical History:  Diagnosis Date  . Chronic kidney disease   . Coronary artery disease   . Diabetes mellitus   . Hypertension   . 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 Spontaneous pneumothorax 09/15/2010   right   Past Surgical History:  Procedure Laterality Date  . ANKLE FRACTURE SURGERY  2008   [facility]  . APPENDECTOMY  1960's  . CHEST TUBE INSERTION Right 09/15/2010   Dr Servando Snare - spontaneous PTX  . CHOLECYSTECTOMY  2008  . COLONOSCOPY N/A 09/07/2017   Procedure: COLONOSCOPY;  Surgeon: Daneil Dolin, MD;  [facility];  Service: Endoscopy;  Laterality: N/A;  10:30am  . CORONARY ARTERY BYPASS GRAFT  2003  . EYE SURGERY  2001   Cataracts removed bilaterally  . INCISIONAL HERNIA REPAIR N/A 01/29/2015   Procedure: Fatima Blank HERNIORRHAPHY WITH MESH;  Surgeon: Aviva Signs, MD;  Location: [facility];  Service: General;  Laterality: N/A;  . INSERTION OF MESH N/A 01/29/2015   Procedure: INSERTION OF MESH;  Surgeon:  Aviva Signs, MD;  Location: [facility];  Service: General;  Laterality: N/A;  . [place]  . LEFT HEART CATH AND CORS/GRAFTS ANGIOGRAPHY N/A 06/20/2017   Procedure: LEFT HEART CATH AND CORS/GRAFTS ANGIOGRAPHY;  Surgeon: Martinique, Peter M, MD;  Location: [place] [facility];  Service: Cardiovascular;  Laterality: N/A;  . ORIF ANKLE FRACTURE  08/26/2011   Procedure: OPEN REDUCTION INTERNAL FIXATION (ORIF) ANKLE FRACTURE;  Surgeon: Sanjuana Kava, MD;  Location: [facility];  Service: Orthopedics;  Laterality: Right;  . POLYPECTOMY  09/07/2017   Procedure: POLYPECTOMY;  Surgeon: Daneil Dolin, MD;  [facility];  Service: Endoscopy;;  ascending x2 (cold snare)  . PROSTATE SURGERY  2012   Enlarged prostate  . TRANSURETHRAL RESECTION OF PROSTATE  2012     SOCIAL HISTORY:  Social History   Socioeconomic History  . Marital status: Married    Spouse name: Not on file  . Number of children: Not on file  . Years of education: Not on file  . Highest education level: Not on file  Occupational History  . Not on file  Social Needs  . Financial resource strain: Not on file  . Food insecurity:    Worry: Not on file    Inability: Not on file  . Transportation needs:    Medical: Not on file    Non-medical: Not on file  Tobacco Use  .  Smoking status: Former Smoker    Packs/day: 1.00    Years: 33.00    Pack years: 33.00    Last attempt to quit: 05/17/1987    Years since quitting: 30.4  . Smokeless tobacco: Never Used  Substance and Sexual Activity  . Alcohol use: No  . Drug use: No  . Sexual activity: Yes  Lifestyle  . Physical activity:    Days per week: Not on file    Minutes per session: Not on file  . Stress: Not on file  Relationships  . Social connections:    Talks on phone: Not on file    Gets together: Not on file    Attends religious service: Not on file    Active member of club or organization: Not on file    Attends meetings of clubs or  organizations: Not on file    Relationship status: Not on file  . Intimate partner violence:    Fear of current or ex partner: Not on file    Emotionally abused: Not on file    Physically abused: Not on file    Forced sexual activity: Not on file  Other Topics Concern  . Not on file  Social History Narrative  . Not on file    FAMILY HISTORY:  Family History  Problem Relation Age of Onset  . Aneurysm Mother   . Heart attack Father   . Colon cancer Maternal Uncle   . Cancer Brother   . Gastric cancer Neg Hx   . Esophageal cancer Neg Hx     CURRENT MEDICATIONS:  Outpatient Encounter Medications as of 11/01/2017  Medication Sig  . amLODipine (NORVASC) 10 MG tablet Take 1 tablet (10 mg total) by mouth daily.  Marland Kitchen aspirin EC 81 MG tablet Take 81 mg by mouth daily.  . Cholecalciferol (VITAMIN D3) 5000 units CAPS Take 5,000 Units by mouth daily.  Marland Kitchen dabrafenib mesylate (TAFLINAR) 50 MG capsule Take 2 capsules (100 mg total) by mouth 2 (two) times daily. Take on an empty stomach 1 hour before or 2 hours after meals.  Marland Kitchen glimepiride (AMARYL) 2 MG tablet Take 2 mg by mouth daily with breakfast.   . metFORMIN (GLUCOPHAGE) 500 MG tablet Take 1 tablet (500 mg total) by mouth 2 (two) times daily with a meal.  . metoprolol (LOPRESSOR) 100 MG tablet Take 100 mg by mouth 2 (two) times daily.  . nitroGLYCERIN (NITROSTAT) 0.4 MG SL tablet Place 0.4 mg under the tongue every 5 (five) minutes as needed for chest pain.   Marland Kitchen ondansetron (ZOFRAN) 8 MG tablet Take 1 tablet (8 mg total) by mouth every 8 (eight) hours as needed for nausea or vomiting.  . pioglitazone (ACTOS) 45 MG tablet Take 45 mg by mouth daily.  . prochlorperazine (COMPAZINE) 10 MG tablet Take 1 tablet (10 mg total) by mouth every 6 (six) hours as needed for nausea or vomiting.  . sitaGLIPtin (JANUVIA) 50 MG tablet Take 50 mg by mouth daily.   . trametinib dimethyl sulfoxide (MEKINIST) 0.5 MG tablet Take 3 tablets (1.5 mg total) by mouth  daily. Take 1 hour before or 2 hours after a meal. Store refrigerated in original container.  . isosorbide mononitrate (IMDUR) 30 MG 24 hr tablet Take 1 tablet (30 mg total) by mouth daily.   No facility-administered encounter medications on file as of 11/01/2017.     ALLERGIES:  No Known Allergies   PHYSICAL EXAM:  ECOG Performance status: 1  Vitals:   11/01/17  0927  BP: (!) 126/53  Pulse: (!) 57  Resp: 18  Temp: 98.6 F (37 C)  SpO2: 97%   Filed Weights   11/01/17 0927  Weight: 193 lb 9.6 oz (87.8 kg)    Physical Exam   LABORATORY DATA:  I have reviewed the labs as listed.  CBC    Component Value Date/Time   WBC 5.8 10/28/2017 1127   RBC 5.03 10/28/2017 1127   HGB 13.6 10/28/2017 1127   HCT 41.0 10/28/2017 1127   PLT 139 (L) 10/28/2017 1127   MCV 81.5 10/28/2017 1127   MCH 27.0 10/28/2017 1127   MCHC 33.2 10/28/2017 1127   RDW 14.3 10/28/2017 1127   LYMPHSABS 1.4 10/28/2017 1127   MONOABS 0.4 10/28/2017 1127   EOSABS 0.1 10/28/2017 1127   BASOSABS 0.0 10/28/2017 1127   CMP Latest Ref Rng & Units 10/28/2017 10/17/2017 09/01/2017  Glucose 65 - 99 mg/dL 215(H) 163(H) -  BUN 6 - 20 mg/dL 19 18 -  Creatinine 0.61 - 1.24 mg/dL 1.71(H) 1.72(H) 1.80(H)  Sodium 135 - 145 mmol/L 135 135 -  Potassium 3.5 - 5.1 mmol/L 4.9 4.6 -  Chloride 101 - 111 mmol/L 100(L) 102 -  CO2 22 - 32 mmol/L 28 27 -  Calcium 8.9 - 10.3 mg/dL 9.0 9.0 -  Total Protein 6.5 - 8.1 g/dL 7.3 7.2 -  Total Bilirubin 0.3 - 1.2 mg/dL 0.8 0.7 -  Alkaline Phos 38 - 126 U/L 103 102 -  AST 15 - 41 U/L 21 19 -  ALT 17 - 63 U/L 16(L) 14(L) -          ASSESSMENT & PLAN:   Adenocarcinoma of lung, stage 4 (HCC) 1.  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  He has multiple bilateral lung  nodules making him metastatic.  He had a colonoscopy which showed multiple polyps which were tubular adenomas.  His functional status is decent at this time.  He does not have any malignancy related symptoms at this time.   -Dabrafenib 100 mg twice daily and trametinib 1.5 mg once daily (response rates of 65% and median PFS of 10.9 months) on an empty stomach was started on 10/07/2017.  He is not having any problems.  Upon further questioning and confirmation, we found out that he is taking dabrafenib 50 mg twice daily.  I have told him to increase it to 100 mg twice daily.  His blood counts today are within normal limits.  I have discussed the results of 2D echocardiogram dated 10/28/2017 which shows ejection fraction of 60%.  I plan to repeat echocardiogram in 2 to 3 months.  He will be seen back in 2 weeks for follow-up.  2.  Hypertension: He is taking metoprolol 100 mg twice daily.  At last visit on 10/18/2017, I have added Norvasc 10 mg daily.  His blood pressure today is very well controlled.      Orders placed this encounter:  Orders Placed This Encounter  Procedures  . CBC with Differential  . Comprehensive metabolic panel  . Magnesium      Derek Jack, MD [place] [phone]

## 2017-11-08 ENCOUNTER — Encounter (HOSPITAL_COMMUNITY): Payer: Self-pay

## 2017-11-08 NOTE — Progress Notes (Signed)
Patient came by cancer center because he states he cannot take the Taflinar as directed. He is supposed to take 100 mg BID. Previously he was just taking 50 mg BID. Patient states his blood sugar was elevated around 400-500 and nothing would bring it down. (He is on 4 diabetic medications; amaryl, actos, metformin, and [place].) Patient states he felt fatigued and had vision problems also when taking the Taflinar 100 mg BID. He stopped taking the Taflinar on Sunday June 23 but continued the Mekinist. Once his blood sugar came down to his normal range, he started taking the Taflinar again but at a reduced dose, 50 mg BID. Explained to patient I would notify the MD and someone would call him to let him know what to do concerning the [place] and [place].

## 2017-11-10 ENCOUNTER — Telehealth (HOSPITAL_COMMUNITY): Payer: Self-pay | Admitting: *Deleted

## 2017-11-10 NOTE — Telephone Encounter (Signed)
Pt advised per Dr. Delton Coombes to take 2 tablets of the Taflinar 100mg  in the morning and one tablet in the afternoon. The pt was advised multiple times to do this and he verbalized understanding. Pt states that his primary care doctor gives him his diabetes medication and that he has an appointment with him on the 9th of July. Pt to call us back if there are any problems.

## 2017-11-13 ENCOUNTER — Encounter (HOSPITAL_COMMUNITY): Payer: Self-pay | Admitting: Emergency Medicine

## 2017-11-13 ENCOUNTER — Other Ambulatory Visit: Payer: Self-pay

## 2017-11-13 ENCOUNTER — Emergency Department (HOSPITAL_COMMUNITY)
Admission: EM | Admit: 2017-11-13 | Discharge: 2017-11-13 | Disposition: A | Discharge status: Home / Self Care | Payer: Medicare Other | Attending: Emergency Medicine | Admitting: Emergency Medicine

## 2017-11-13 DIAGNOSIS — Z7982 Long term (current) use of aspirin: Secondary | ICD-10-CM | POA: Insufficient documentation

## 2017-11-13 DIAGNOSIS — E1122 Type 2 diabetes mellitus with diabetic chronic kidney disease: Secondary | ICD-10-CM | POA: Diagnosis not present

## 2017-11-13 DIAGNOSIS — Z79899 Other long term (current) drug therapy: Secondary | ICD-10-CM | POA: Insufficient documentation

## 2017-11-13 DIAGNOSIS — I2510 Atherosclerotic heart disease of native coronary artery without angina pectoris: Secondary | ICD-10-CM | POA: Diagnosis not present

## 2017-11-13 DIAGNOSIS — Z87891 Personal history of nicotine dependence: Secondary | ICD-10-CM | POA: Diagnosis not present

## 2017-11-13 DIAGNOSIS — I129 Hypertensive chronic kidney disease with stage 1 through stage 4 chronic kidney disease, or unspecified chronic kidney disease: Secondary | ICD-10-CM | POA: Diagnosis not present

## 2017-11-13 DIAGNOSIS — Z951 Presence of aortocoronary bypass graft: Secondary | ICD-10-CM | POA: Diagnosis not present

## 2017-11-13 DIAGNOSIS — Z7984 Long term (current) use of oral hypoglycemic drugs: Secondary | ICD-10-CM | POA: Insufficient documentation

## 2017-11-13 DIAGNOSIS — M5481 Occipital neuralgia: Secondary | ICD-10-CM | POA: Diagnosis not present

## 2017-11-13 DIAGNOSIS — N183 Chronic kidney disease, stage 3 (moderate): Secondary | ICD-10-CM | POA: Diagnosis not present

## 2017-11-13 DIAGNOSIS — M542 Cervicalgia: Secondary | ICD-10-CM | POA: Diagnosis present

## 2017-11-13 HISTORY — DX: Malignant (primary) neoplasm, unspecified: C80.1

## 2017-11-13 NOTE — ED Triage Notes (Signed)
Patient c/o right neck pain that radiates into right side of head. Per patient started 2 days and is progressively getting worse. Per patient nausea but no vomiting. Per patient sensitivity to light and sound and some dizziness. Denies any slurred speech, facial drooping, or weakness on a certain side. Patient took tylenol with no relief, last dose this morning.

## 2017-11-13 NOTE — ED Notes (Signed)
MD at bedside. 

## 2017-11-13 NOTE — ED Provider Notes (Signed)
[facility] EMERGENCY DEPARTMENT Provider Note   CSN: [id number] Arrival date & time: 11/13/17  1249     History   Chief Complaint Chief Complaint  Patient presents with  . Neck Pain    HPI EMON LANCE is a 77 y.o. male.  HPI   47yM with HA. Onset two days ago. Sharp pain like being stabbed in r occipital region and radiating towards R ear. Pain has waxed and waned. Now persistent ache but will occasionally get much sharp pain again. No appreciable exacerbating or relieving factors. No trauma. No fever or chills. No significant HA history. Has tried tylenol without much improvement.   Past Medical History:  Diagnosis Date  . Cancer ([place])   . Chronic kidney disease   . Coronary artery disease   . Diabetes mellitus   . Hypertension   . 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 Spontaneous pneumothorax 09/15/2010   right    Patient Active Problem List   Diagnosis Date Noted  . Adenocarcinoma of lung, stage 4 ([place]) 09/08/2017  . Abnormal CT of the abdomen 09/05/2017  . CKD (chronic kidney disease) stage 3, GFR 30-59 ml/min (HCC) 06/20/2017  . Type 2 diabetes mellitus without complication, without long-term current use of insulin ([place]) 11/04/2008  . Coronary atherosclerosis 11/04/2008  . Angina pectoris ([place]) 11/04/2008  . 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 ANKLE FRACTURE SURGERY  2008   [facility]  . APPENDECTOMY  1960's  . CHEST TUBE INSERTION Right 09/15/2010   Dr Servando Snare - spontaneous PTX  . CHOLECYSTECTOMY  2008  . COLONOSCOPY N/A 09/07/2017   Procedure: COLONOSCOPY;  Surgeon: Daneil Dolin, MD;  Location: [facility] ENDO SUITE;  Service: Endoscopy;  Laterality: N/A;  10:30am  . CORONARY ARTERY BYPASS GRAFT  2003  . EYE SURGERY  2001   Cataracts removed bilaterally  . INCISIONAL HERNIA REPAIR N/A 01/29/2015   Procedure: Fatima Blank HERNIORRHAPHY WITH MESH;  Surgeon: Aviva Signs, MD;  Location: [facility] ORS;  Service: General;  Laterality: N/A;  . INSERTION OF MESH N/A 01/29/2015   Procedure: INSERTION OF MESH;  Surgeon: Aviva Signs, MD;  Location: [facility] ORS;  Service: General;  Laterality: N/A;  . [place]  . LEFT HEART CATH AND CORS/GRAFTS ANGIOGRAPHY N/A 06/20/2017   Procedure: LEFT HEART CATH AND CORS/GRAFTS ANGIOGRAPHY;  Surgeon: Martinique, Peter M, MD;  Location: [facility] CV LAB;  Service: Cardiovascular;  Laterality: N/A;  . ORIF ANKLE FRACTURE  08/26/2011   Procedure: OPEN REDUCTION INTERNAL FIXATION (ORIF) ANKLE FRACTURE;  Surgeon: Sanjuana Kava, MD;  Location: [facility] ORS;  Service: Orthopedics;  Laterality: Right;  . POLYPECTOMY  09/07/2017   Procedure: POLYPECTOMY;  Surgeon: Daneil Dolin, MD;  Location: [facility] ENDO SUITE;  Service: Endoscopy;;  ascending x2 (cold snare)  . PROSTATE SURGERY  2012   Enlarged prostate  . TRANSURETHRAL RESECTION OF PROSTATE  2012        Home Medications    Prior to Admission medications   Medication Sig Start Date End Date Taking? Authorizing Provider  amLODipine (NORVASC) 10 MG tablet Take 1 tablet (10 mg total) by mouth daily. 10/18/17   Derek Jack, MD  aspirin EC 81 MG tablet Take 81 mg by mouth daily.    [provider]  Cholecalciferol (VITAMIN D3) 5000 units CAPS Take 5,000 Units by mouth daily.    [provider]  dabrafenib mesylate (TAFLINAR) 50 MG capsule Take 2 capsules (100 mg total) by mouth 2 (two) times daily. Take on an empty stomach 1 hour before or 2 hours after meals. 09/26/17   Derek Jack, MD  glimepiride (AMARYL) 2 MG tablet Take 2 mg by mouth daily with breakfast.     [provider]  isosorbide mononitrate (IMDUR) 30 MG 24 hr tablet Take 1 tablet (30 mg total) by mouth daily. 07/19/17 10/19/17  Herminio Commons, MD  metFORMIN (GLUCOPHAGE) 500 MG tablet Take 1 tablet (500  mg total) by mouth 2 (two) times daily with a meal. 06/23/17   Martinique, Peter M, MD  metoprolol (LOPRESSOR) 100 MG tablet Take 100 mg by mouth 2 (two) times daily.    [provider]  nitroGLYCERIN (NITROSTAT) 0.4 MG SL tablet Place 0.4 mg under the tongue every 5 (five) minutes as needed for chest pain.  06/01/17   [provider]  ondansetron (ZOFRAN) 8 MG tablet Take 1 tablet (8 mg total) by mouth every 8 (eight) hours as needed for nausea or vomiting. 09/27/17   Derek Jack, MD  pioglitazone (ACTOS) 45 MG tablet Take 45 mg by mouth daily.    [provider]  prochlorperazine (COMPAZINE) 10 MG tablet Take 1 tablet (10 mg total) by mouth every 6 (six) hours as needed for nausea or vomiting. 09/27/17   Derek Jack, MD  sitaGLIPtin (JANUVIA) 50 MG tablet Take 50 mg by mouth daily.     [provider]  trametinib dimethyl sulfoxide (MEKINIST) 0.5 MG tablet Take 3 tablets (1.5 mg total) by mouth daily. Take 1 hour before or 2 hours after a meal. Store refrigerated in original container. 09/26/17   Derek Jack, MD    Family History Family History  Problem Relation Age of Onset  . Aneurysm Mother   . Heart attack Father   . Colon cancer Maternal Uncle   . Cancer Brother   . Gastric cancer Neg Hx   . Esophageal cancer Neg Hx     Social History Social History   Tobacco Use  . Smoking status: Former Smoker    Packs/day: 1.00    Years: 33.00    Pack years: 33.00    Last attempt to quit: 05/17/1987    Years since quitting: 30.5  . Smokeless tobacco: Never Used  Substance Use Topics  . Alcohol use: No  . Drug use: No     Allergies   Patient has no known allergies.   Review of Systems Review of Systems  All systems reviewed and negative, other than as noted in HPI.  Physical Exam Updated Vital Signs BP (!) 155/86 (BP Location: Left Arm)   Pulse 61   Temp 97.6 F (36.4 C) (Oral)   Resp 20   Ht 5\' 10"  (1.778 m)   Wt 88  kg (194 lb)   SpO2 96%   BMI 27.84 kg/m   Physical Exam  Constitutional: He is oriented to person, place, and time. He appears well-developed and well-nourished. No distress.  HENT:  Head: Normocephalic and atraumatic.    TTP in pictured area. No overlying skin changes. No nuchal rigidity.   Eyes: Conjunctivae are normal. Right eye exhibits no discharge. Left eye exhibits no discharge.  Neck: Neck supple.  Cardiovascular: Normal rate, regular rhythm and normal heart sounds. Exam reveals no gallop and no friction  rub.  No murmur heard. Pulmonary/Chest: Effort normal and breath sounds normal. No respiratory distress.  Abdominal: Soft. He exhibits no distension. There is no tenderness.  Musculoskeletal: He exhibits no edema or tenderness.  Neurological: He is alert and oriented to person, place, and time. No cranial nerve deficit. He exhibits normal muscle tone. Coordination normal.  Skin: Skin is warm and dry.  Psychiatric: He has a normal mood and affect. His behavior is normal. Thought content normal.  Nursing note and vitals reviewed.    ED Treatments / Results  Labs (all labs ordered are listed, but only abnormal results are displayed) Labs Reviewed - No data to display  EKG None  Radiology No results found.  Procedures Procedures (including critical care time)  NERVE BLOCK Performed by: Virgel Manifold Consent: Verbal consent obtained. Required items: required blood products, implants, devices, and special equipment available Time out: Immediately prior to procedure a "time out" was called to verify the correct patient, procedure, equipment, support staff and site/side marked as required.  Indication: pain Nerve block body site: R greater occipital nerve  Preparation: Patient was prepped and draped in the usual sterile fashion. Needle gauge: 25 G Location technique: anatomical landmarks  Local anesthetic: 0.25% marcaine Anesthetic total: 3 ml  Outcome: pain  improved Patient tolerance: Patient tolerated the procedure well wi th no immediate complications.  Medications Ordered in ED Medications - No data to display   Initial Impression / Assessment and Plan / ED Course  I have reviewed the triage vital signs and the nursing notes.  Pertinent labs & imaging results that were available during my care of the patient were reviewed by me and considered in my medical decision making (see chart for details).     77yM with HA. Symptoms and exam consistent with occipital neuralgia. Nerve block with great response. I doubt head bleed, infectious, mass, or other emergent process. PRN follow-up with neurology for recurrent symptoms.   Final Clinical Impressions(s) / ED Diagnoses   Final diagnoses:  Occipital neuralgia of right side    ED Discharge Orders    None       Virgel Manifold, MD 11/13/17 1555

## 2017-11-15 DIAGNOSIS — Z79899 Other long term (current) drug therapy: Secondary | ICD-10-CM | POA: Diagnosis not present

## 2017-11-15 DIAGNOSIS — E1129 Type 2 diabetes mellitus with other diabetic kidney complication: Secondary | ICD-10-CM | POA: Diagnosis not present

## 2017-11-15 DIAGNOSIS — I2510 Atherosclerotic heart disease of native coronary artery without angina pectoris: Secondary | ICD-10-CM | POA: Diagnosis not present

## 2017-11-15 DIAGNOSIS — N183 Chronic kidney disease, stage 3 (moderate): Secondary | ICD-10-CM | POA: Diagnosis not present

## 2017-11-16 ENCOUNTER — Inpatient Hospital Stay (HOSPITAL_COMMUNITY): Payer: Medicare Other | Attending: Hematology

## 2017-11-16 DIAGNOSIS — I10 Essential (primary) hypertension: Secondary | ICD-10-CM | POA: Insufficient documentation

## 2017-11-16 DIAGNOSIS — E119 Type 2 diabetes mellitus without complications: Secondary | ICD-10-CM | POA: Diagnosis not present

## 2017-11-16 DIAGNOSIS — C3431 Malignant neoplasm of lower lobe, right bronchus or lung: Secondary | ICD-10-CM | POA: Diagnosis not present

## 2017-11-16 DIAGNOSIS — C3490 Malignant neoplasm of unspecified part of unspecified bronchus or lung: Secondary | ICD-10-CM

## 2017-11-16 DIAGNOSIS — Z800 Family history of malignant neoplasm of digestive organs: Secondary | ICD-10-CM | POA: Insufficient documentation

## 2017-11-16 DIAGNOSIS — Z87891 Personal history of nicotine dependence: Secondary | ICD-10-CM | POA: Diagnosis not present

## 2017-11-16 LAB — COMPREHENSIVE METABOLIC PANEL
ALT: 18 U/L (ref 0–44)
ANION GAP: 6 (ref 5–15)
AST: 19 U/L (ref 15–41)
Albumin: 3.7 g/dL (ref 3.5–5.0)
Alkaline Phosphatase: 102 U/L (ref 38–126)
BUN: 26 mg/dL — ABNORMAL HIGH (ref 8–23)
CHLORIDE: 99 mmol/L (ref 98–111)
CO2: 28 mmol/L (ref 22–32)
Calcium: 9 mg/dL (ref 8.9–10.3)
Creatinine, Ser: 1.81 mg/dL — ABNORMAL HIGH (ref 0.61–1.24)
GFR, EST AFRICAN AMERICAN: 40 mL/min — AB (ref >60)
GFR, EST NON AFRICAN AMERICAN: 34 mL/min — AB (ref >60)
Glucose, Bld: 191 mg/dL — ABNORMAL HIGH (ref 70–99)
POTASSIUM: 4.8 mmol/L (ref 3.5–5.1)
Sodium: 133 mmol/L — ABNORMAL LOW (ref 135–145)
Total Bilirubin: 0.9 mg/dL (ref 0.3–1.2)
Total Protein: 6.9 g/dL (ref 6.5–8.1)

## 2017-11-16 LAB — CBC WITH DIFFERENTIAL/PLATELET
BASOS ABS: 0 10*3/uL (ref 0.0–0.1)
Basophils Relative: 0 %
EOS PCT: 1 %
Eosinophils Absolute: 0.1 10*3/uL (ref 0.0–0.7)
HCT: 38.9 % — ABNORMAL LOW (ref 39.0–52.0)
Hemoglobin: 13.3 g/dL (ref 13.0–17.0)
LYMPHS PCT: 23 %
Lymphs Abs: 2 10*3/uL (ref 0.7–4.0)
MCH: 27.4 pg (ref 26.0–34.0)
MCHC: 34.2 g/dL (ref 30.0–36.0)
MCV: 80.2 fL (ref 78.0–100.0)
MONO ABS: 0.5 10*3/uL (ref 0.1–1.0)
MONOS PCT: 6 %
NEUTROS ABS: 6.2 10*3/uL (ref 1.7–7.7)
Neutrophils Relative %: 70 %
PLATELETS: 215 10*3/uL (ref 150–400)
RBC: 4.85 MIL/uL (ref 4.22–5.81)
RDW: 14.6 % (ref 11.5–15.5)
WBC: 8.8 10*3/uL (ref 4.0–10.5)

## 2017-11-16 LAB — MAGNESIUM: MAGNESIUM: 1.9 mg/dL (ref 1.7–2.4)

## 2017-11-22 DIAGNOSIS — C3490 Malignant neoplasm of unspecified part of unspecified bronchus or lung: Secondary | ICD-10-CM | POA: Diagnosis not present

## 2017-11-22 DIAGNOSIS — I10 Essential (primary) hypertension: Secondary | ICD-10-CM | POA: Diagnosis not present

## 2017-11-22 DIAGNOSIS — N183 Chronic kidney disease, stage 3 (moderate): Secondary | ICD-10-CM | POA: Diagnosis not present

## 2017-11-22 DIAGNOSIS — E1122 Type 2 diabetes mellitus with diabetic chronic kidney disease: Secondary | ICD-10-CM | POA: Diagnosis not present

## 2017-11-23 ENCOUNTER — Other Ambulatory Visit: Payer: Self-pay

## 2017-11-23 ENCOUNTER — Encounter (HOSPITAL_COMMUNITY): Payer: Self-pay | Admitting: Hematology

## 2017-11-23 ENCOUNTER — Inpatient Hospital Stay (HOSPITAL_BASED_OUTPATIENT_CLINIC_OR_DEPARTMENT_OTHER): Payer: Medicare Other | Admitting: Hematology

## 2017-11-23 VITALS — BP 103/55 | HR 63 | Temp 97.8°F | Resp 18 | Wt 192.0 lb

## 2017-11-23 DIAGNOSIS — Z87891 Personal history of nicotine dependence: Secondary | ICD-10-CM | POA: Diagnosis not present

## 2017-11-23 DIAGNOSIS — I10 Essential (primary) hypertension: Secondary | ICD-10-CM | POA: Diagnosis not present

## 2017-11-23 DIAGNOSIS — C3490 Malignant neoplasm of unspecified part of unspecified bronchus or lung: Secondary | ICD-10-CM

## 2017-11-23 DIAGNOSIS — C3431 Malignant neoplasm of lower lobe, right bronchus or lung: Secondary | ICD-10-CM | POA: Diagnosis not present

## 2017-11-23 DIAGNOSIS — Z800 Family history of malignant neoplasm of digestive organs: Secondary | ICD-10-CM | POA: Diagnosis not present

## 2017-11-23 DIAGNOSIS — E119 Type 2 diabetes mellitus without complications: Secondary | ICD-10-CM | POA: Diagnosis not present

## 2017-11-23 NOTE — Assessment & Plan Note (Signed)
1.  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  He has multiple bilateral lung nodules making him metastatic.  He had a colonoscopy which showed multiple polyps which were tubular adenomas.  His functional status is decent at this time.  He does not have any malignancy related symptoms at this time.   -Dabrafenib 100 mg twice daily and trametinib 1.5 mg once daily (response rates of 65% and median PFS of 10.9 months) on an empty stomach was started on 10/07/2017.  He is not having any problems.  Upon further questioning and confirmation, we found out that he is taking dabrafenib 50 mg twice daily.  2D echocardiogram dated 10/28/2017 which shows ejection fraction of 60%.  I plan to repeat echocardiogram in 2 to 3 months.  He increased dabrafenib 200 mg twice daily on 11/10/2017.  So far he has tolerated very well.  He went to the ER on 11/13/2017 with right occipital pain which improved with injection.  He no longer has that pain.  Denies any GI symptoms.  We will continue at the same dose level.  I will see him back in 3 weeks for follow-up.  I plan to repeat a PET CT scan prior to next visit.  He will likely not be able to get IV contrast given his elevated creatinine.    2.  Hypertension: He is taking metoprolol 100 mg twice daily.On 10/18/2017, we have added Norvasc 10 mg daily.  His blood pressure is very well controlled.

## 2017-11-23 NOTE — Progress Notes (Signed)
[place], [place] [id number]   CLINIC:  Medical Oncology/Hematology  PCP:  [name], MD [address] [place] [address] [phone]   REASON FOR VISIT:  Follow-up for metastatic lung cancer  CURRENT THERAPY: Dabrafenib and Trametinib      INTERVAL HISTORY:  [name] 77 y.o. male returns for routine follow-up for metastatic lung cancer. Patient here today with his wife. He was seen at the ER 3 weeks ago for a sever occipital headache. Patient was treated and discharged. Patient looks good today has a slight cold with a non-productive cough and rhinitis. Other patients energy levels are 100% and appetite is 100%. No recent weight loss. Denies any new pains. Denies and nausea, vomiting, or diarrhea. Patient continues to take his Dabrafenib and trametinib on an empty stomach and is tolerating it very well.    REVIEW OF SYSTEMS:  Review of Systems  Constitutional: Negative.   HENT:  Negative.   Eyes: Negative.   Respiratory: Positive for cough.   Cardiovascular: Negative.   Gastrointestinal: Negative.   Endocrine: Negative.   Genitourinary: Negative.    Musculoskeletal: Negative.   Skin: Negative.   Neurological: Negative.   Hematological: Negative.   Psychiatric/Behavioral: Negative.      PAST MEDICAL/SURGICAL HISTORY:  Past Medical History:  Diagnosis Date  . Cancer ([place])   . Chronic kidney disease   . Coronary artery disease   . Diabetes mellitus   . Hypertension   . 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 Spontaneous pneumothorax 09/15/2010   right   Past Surgical History:  Procedure Laterality Date  . ANKLE FRACTURE SURGERY  2008   [facility]  . APPENDECTOMY  1960's  . CHEST TUBE INSERTION Right 09/15/2010   Dr Servando Snare - spontaneous PTX  . CHOLECYSTECTOMY  2008  . COLONOSCOPY N/A 09/07/2017   Procedure: COLONOSCOPY;  Surgeon: Daneil Dolin, MD;  Location: [facility] ENDO SUITE;  Service: Endoscopy;  Laterality: N/A;  10:30am  . CORONARY ARTERY BYPASS GRAFT  2003  . EYE SURGERY  2001   Cataracts removed bilaterally  . INCISIONAL HERNIA REPAIR N/A 01/29/2015   Procedure: Fatima Blank HERNIORRHAPHY WITH MESH;  Surgeon: Aviva Signs, MD;  Location: [facility] ORS;  Service: General;  Laterality: N/A;  . INSERTION OF MESH N/A 01/29/2015   Procedure: INSERTION OF MESH;  Surgeon: Aviva Signs, MD;  Location: [facility] ORS;  Service: General;  Laterality: N/A;  . [place]  . LEFT HEART CATH AND CORS/GRAFTS ANGIOGRAPHY N/A 06/20/2017   Procedure: LEFT HEART CATH AND CORS/GRAFTS ANGIOGRAPHY;  Surgeon: Martinique, Peter M, MD;  Location: [facility] CV LAB;  Service: Cardiovascular;  Laterality: N/A;  . ORIF ANKLE FRACTURE  08/26/2011   Procedure: OPEN REDUCTION INTERNAL FIXATION (ORIF) ANKLE FRACTURE;  Surgeon: Sanjuana Kava, MD;  Location: [facility] ORS;  Service: Orthopedics;  Laterality: Right;  . POLYPECTOMY  09/07/2017   Procedure: POLYPECTOMY;  Surgeon: Daneil Dolin, MD;  Location: [facility] ENDO SUITE;  Service: Endoscopy;;  ascending x2 (cold snare)  . PROSTATE SURGERY  2012   Enlarged prostate  . TRANSURETHRAL RESECTION OF PROSTATE  2012     SOCIAL HISTORY:  Social History   Socioeconomic History  . Marital status: Married    Spouse name: Not on file  . Number of  children: Not on file  . Years of education: Not on file  . Highest education level: Not on file  Occupational History  . Not on file  Social Needs  . Financial resource strain: Not on file  . Food insecurity:    Worry: Not on file    Inability: Not on file  . Transportation needs:    Medical: Not on file    Non-medical: Not on file  Tobacco Use  . Smoking status: Former Smoker    Packs/day: 1.00    Years: 33.00    Pack years: 33.00    Last attempt to quit: 05/17/1987    Years since quitting: 30.5  .  Smokeless tobacco: Never Used  Substance and Sexual Activity  . Alcohol use: No  . Drug use: No  . Sexual activity: Yes  Lifestyle  . Physical activity:    Days per week: Not on file    Minutes per session: Not on file  . Stress: Not on file  Relationships  . Social connections:    Talks on phone: Not on file    Gets together: Not on file    Attends religious service: Not on file    Active member of club or organization: Not on file    Attends meetings of clubs or organizations: Not on file    Relationship status: Not on file  . Intimate partner violence:    Fear of current or ex partner: Not on file    Emotionally abused: Not on file    Physically abused: Not on file    Forced sexual activity: Not on file  Other Topics Concern  . Not on file  Social History Narrative  . Not on file    FAMILY HISTORY:  Family History  Problem Relation Age of Onset  . Aneurysm Mother   . Heart attack Father   . Colon cancer Maternal Uncle   . Cancer Brother   . Gastric cancer Neg Hx   . Esophageal cancer Neg Hx     CURRENT MEDICATIONS:  Outpatient Encounter Medications as of 11/23/2017  Medication Sig  . amLODipine (NORVASC) 10 MG tablet Take 1 tablet (10 mg total) by mouth daily.  Marland Kitchen aspirin EC 81 MG tablet Take 81 mg by mouth daily.  . Cholecalciferol (VITAMIN D3) 5000 units CAPS Take 5,000 Units by mouth daily.  Marland Kitchen dabrafenib mesylate (TAFLINAR) 50 MG capsule Take 2 capsules (100 mg total) by mouth 2 (two) times daily. Take on an empty stomach 1 hour before or 2 hours after meals.  . gabapentin (NEURONTIN) 300 MG capsule   . glimepiride (AMARYL) 2 MG tablet Take 2 mg by mouth daily with breakfast.   . metFORMIN (GLUCOPHAGE) 500 MG tablet Take 1 tablet (500 mg total) by mouth 2 (two) times daily with a meal.  . metoprolol (LOPRESSOR) 100 MG tablet Take 100 mg by mouth 2 (two) times daily.  . nitroGLYCERIN (NITROSTAT) 0.4 MG SL tablet Place 0.4 mg under the tongue every 5 (five)  minutes as needed for chest pain.   Marland Kitchen ondansetron (ZOFRAN) 8 MG tablet Take 1 tablet (8 mg total) by mouth every 8 (eight) hours as needed for nausea or vomiting.  . pioglitazone (ACTOS) 45 MG tablet Take 45 mg by mouth daily.  . prochlorperazine (COMPAZINE) 10 MG tablet Take 1 tablet (10 mg total) by mouth every 6 (six) hours as needed for nausea or vomiting.  . sitaGLIPtin (JANUVIA) 50 MG tablet Take 50 mg by mouth  daily.   . trametinib dimethyl sulfoxide (MEKINIST) 0.5 MG tablet Take 3 tablets (1.5 mg total) by mouth daily. Take 1 hour before or 2 hours after a meal. Store refrigerated in original container.  . isosorbide mononitrate (IMDUR) 30 MG 24 hr tablet Take 1 tablet (30 mg total) by mouth daily.   No facility-administered encounter medications on file as of 11/23/2017.     ALLERGIES:  No Known Allergies   PHYSICAL EXAM:  ECOG Performance status: 1  Vitals:   11/23/17 1025  BP: (!) 103/55  Pulse: 63  Resp: 18  Temp: 97.8 F (36.6 C)  SpO2: 94%   Filed Weights   11/23/17 1025  Weight: 192 lb (87.1 kg)    Physical Exam   LABORATORY DATA:  I have reviewed the labs as listed.  CBC    Component Value Date/Time   WBC 8.8 11/16/2017 1113   RBC 4.85 11/16/2017 1113   HGB 13.3 11/16/2017 1113   HCT 38.9 (L) 11/16/2017 1113   PLT 215 11/16/2017 1113   MCV 80.2 11/16/2017 1113   MCH 27.4 11/16/2017 1113   MCHC 34.2 11/16/2017 1113   RDW 14.6 11/16/2017 1113   LYMPHSABS 2.0 11/16/2017 1113   MONOABS 0.5 11/16/2017 1113   EOSABS 0.1 11/16/2017 1113   BASOSABS 0.0 11/16/2017 1113   CMP Latest Ref Rng & Units 11/16/2017 10/28/2017 10/17/2017  Glucose 70 - 99 mg/dL 191(H) 215(H) 163(H)  BUN 8 - 23 mg/dL 26(H) 19 18  Creatinine 0.61 - 1.24 mg/dL 1.81(H) 1.71(H) 1.72(H)  Sodium 135 - 145 mmol/L 133(L) 135 135  Potassium 3.5 - 5.1 mmol/L 4.8 4.9 4.6  Chloride 98 - 111 mmol/L 99 100(L) 102  CO2 22 - 32 mmol/L _0 Calcium 8.9 - 10.3 mg/dL 9.0 9.0 9.0  Total Protein  6.5 - 8.1 g/dL 6.9 7.3 7.2  Total Bilirubin 0.3 - 1.2 mg/dL 0.9 0.8 0.7  Alkaline Phos 38 - 126 U/L 102 103 102  AST 15 - 41 U/L _1 ALT 0 - 44 U/L 18 16(L) 14(L)        ASSESSMENT & PLAN:   Adenocarcinoma of lung, stage 4 (HCC) 1.  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  He has multiple bilateral lung nodules making him metastatic.  He had a colonoscopy which showed multiple polyps which were tubular adenomas.  His functional status is decent at this time.  He does not have any malignancy related symptoms at this time.   -Dabrafenib 100 mg twice daily and trametinib 1.5 mg once daily (response rates of 65% and median PFS of 10.9 months) on an empty stomach was started on 10/07/2017.  He is not having any problems.  Upon further questioning and confirmation, we found out that he is taking dabrafenib 50 mg twice daily.  2D echocardiogram dated 10/28/2017 which shows ejection fraction of 60%.  I plan to repeat echocardiogram in 2 to 3 months.  He increased dabrafenib 200 mg twice daily on 11/10/2017.  So far he has tolerated very well.  He went to the ER on 11/13/2017 with right occipital pain which improved with injection.  He no longer has that pain.  Denies any GI symptoms.  We will continue at the same dose level.  I will see him back in 3 weeks for  follow-up.  I plan to repeat a PET CT scan prior to next visit.  He will likely not be able to get IV contrast given his elevated creatinine.    2.  Hypertension: He is taking metoprolol 100 mg twice daily.On 10/18/2017, we have added Norvasc 10 mg daily.  His blood pressure is very well controlled.        Orders placed this encounter:  Orders Placed This Encounter  Procedures  . NM PET Image Restag (PS) Skull Base To Thigh  .  Magnesium  . CBC with Differential/Platelet  . Comprehensive metabolic panel      Derek Jack, MD [place] [phone]

## 2017-11-25 DIAGNOSIS — E1142 Type 2 diabetes mellitus with diabetic polyneuropathy: Secondary | ICD-10-CM | POA: Diagnosis not present

## 2017-11-25 DIAGNOSIS — M542 Cervicalgia: Secondary | ICD-10-CM | POA: Diagnosis not present

## 2017-11-25 DIAGNOSIS — G4714 Hypersomnia due to medical condition: Secondary | ICD-10-CM | POA: Diagnosis not present

## 2017-11-25 DIAGNOSIS — M5481 Occipital neuralgia: Secondary | ICD-10-CM | POA: Diagnosis not present

## 2017-12-12 DIAGNOSIS — E1142 Type 2 diabetes mellitus with diabetic polyneuropathy: Secondary | ICD-10-CM | POA: Diagnosis not present

## 2017-12-12 DIAGNOSIS — M542 Cervicalgia: Secondary | ICD-10-CM | POA: Diagnosis not present

## 2017-12-12 DIAGNOSIS — G4452 New daily persistent headache (NDPH): Secondary | ICD-10-CM | POA: Diagnosis not present

## 2017-12-12 DIAGNOSIS — R42 Dizziness and giddiness: Secondary | ICD-10-CM | POA: Diagnosis not present

## 2017-12-14 DIAGNOSIS — C3490 Malignant neoplasm of unspecified part of unspecified bronchus or lung: Secondary | ICD-10-CM | POA: Diagnosis not present

## 2017-12-14 DIAGNOSIS — M5481 Occipital neuralgia: Secondary | ICD-10-CM | POA: Diagnosis not present

## 2017-12-19 ENCOUNTER — Inpatient Hospital Stay (HOSPITAL_COMMUNITY): Payer: Medicare Other | Attending: Hematology

## 2017-12-19 ENCOUNTER — Ambulatory Visit (HOSPITAL_COMMUNITY)
Admission: RE | Admit: 2017-12-19 | Discharge: 2017-12-19 | Disposition: A | Discharge status: Home / Self Care | Payer: Medicare Other | Source: Ambulatory Visit | Attending: Nurse Practitioner | Admitting: Nurse Practitioner

## 2017-12-19 DIAGNOSIS — I10 Essential (primary) hypertension: Secondary | ICD-10-CM | POA: Diagnosis not present

## 2017-12-19 DIAGNOSIS — E119 Type 2 diabetes mellitus without complications: Secondary | ICD-10-CM | POA: Diagnosis not present

## 2017-12-19 DIAGNOSIS — C3490 Malignant neoplasm of unspecified part of unspecified bronchus or lung: Secondary | ICD-10-CM

## 2017-12-19 DIAGNOSIS — R918 Other nonspecific abnormal finding of lung field: Secondary | ICD-10-CM | POA: Insufficient documentation

## 2017-12-19 DIAGNOSIS — Z87891 Personal history of nicotine dependence: Secondary | ICD-10-CM | POA: Diagnosis not present

## 2017-12-19 LAB — COMPREHENSIVE METABOLIC PANEL
ALT: 18 U/L (ref 0–44)
AST: 21 U/L (ref 15–41)
Albumin: 3.3 g/dL — ABNORMAL LOW (ref 3.5–5.0)
Alkaline Phosphatase: 117 U/L (ref 38–126)
Anion gap: 4 — ABNORMAL LOW (ref 5–15)
BUN: 19 mg/dL (ref 8–23)
CHLORIDE: 99 mmol/L (ref 98–111)
CO2: 28 mmol/L (ref 22–32)
CREATININE: 1.5 mg/dL — AB (ref 0.61–1.24)
Calcium: 8.7 mg/dL — ABNORMAL LOW (ref 8.9–10.3)
GFR calc non Af Amer: 43 mL/min — ABNORMAL LOW (ref >60)
GFR, EST AFRICAN AMERICAN: 50 mL/min — AB (ref >60)
Glucose, Bld: 174 mg/dL — ABNORMAL HIGH (ref 70–99)
Potassium: 4.8 mmol/L (ref 3.5–5.1)
SODIUM: 131 mmol/L — AB (ref 135–145)
Total Bilirubin: 0.6 mg/dL (ref 0.3–1.2)
Total Protein: 6.6 g/dL (ref 6.5–8.1)

## 2017-12-19 LAB — CBC WITH DIFFERENTIAL/PLATELET
BASOS PCT: 0 %
Basophils Absolute: 0 10*3/uL (ref 0.0–0.1)
Eosinophils Absolute: 0 10*3/uL (ref 0.0–0.7)
Eosinophils Relative: 1 %
HEMATOCRIT: 36.1 % — AB (ref 39.0–52.0)
HEMOGLOBIN: 12.2 g/dL — AB (ref 13.0–17.0)
Lymphocytes Relative: 37 %
Lymphs Abs: 1.5 10*3/uL (ref 0.7–4.0)
MCH: 27.5 pg (ref 26.0–34.0)
MCHC: 33.8 g/dL (ref 30.0–36.0)
MCV: 81.5 fL (ref 78.0–100.0)
MONO ABS: 0.4 10*3/uL (ref 0.1–1.0)
Monocytes Relative: 10 %
NEUTROS ABS: 2.1 10*3/uL (ref 1.7–7.7)
Neutrophils Relative %: 52 %
Platelets: 139 10*3/uL — ABNORMAL LOW (ref 150–400)
RBC: 4.43 MIL/uL (ref 4.22–5.81)
RDW: 15.5 % (ref 11.5–15.5)
WBC: 4 10*3/uL (ref 4.0–10.5)

## 2017-12-19 LAB — MAGNESIUM: Magnesium: 1.7 mg/dL (ref 1.7–2.4)

## 2017-12-19 MED ORDER — FLUDEOXYGLUCOSE F - 18 (FDG) INJECTION
12.0000 | Freq: Once | INTRAVENOUS | Status: AC | PRN
  Administered 2017-12-19: 12.18 via INTRAVENOUS

## 2017-12-22 ENCOUNTER — Other Ambulatory Visit: Payer: Self-pay

## 2017-12-22 ENCOUNTER — Inpatient Hospital Stay (HOSPITAL_BASED_OUTPATIENT_CLINIC_OR_DEPARTMENT_OTHER): Payer: Medicare Other | Admitting: Hematology

## 2017-12-22 ENCOUNTER — Encounter (HOSPITAL_COMMUNITY): Payer: Self-pay | Admitting: Hematology

## 2017-12-22 VITALS — BP 131/71 | HR 62 | Resp 18 | Wt 189.6 lb

## 2017-12-22 DIAGNOSIS — I10 Essential (primary) hypertension: Secondary | ICD-10-CM | POA: Diagnosis not present

## 2017-12-22 DIAGNOSIS — C3490 Malignant neoplasm of unspecified part of unspecified bronchus or lung: Secondary | ICD-10-CM

## 2017-12-22 DIAGNOSIS — E119 Type 2 diabetes mellitus without complications: Secondary | ICD-10-CM | POA: Diagnosis not present

## 2017-12-22 DIAGNOSIS — Z87891 Personal history of nicotine dependence: Secondary | ICD-10-CM

## 2017-12-22 NOTE — Progress Notes (Signed)
[name], [name] [id number]   CLINIC:  Medical Oncology/Hematology  PCP:  [name], MD [address] [place] [address] [phone]   REASON FOR VISIT:  Follow-up for metastatic lung cancer  CURRENT THERAPY: Dabrafenib and Trametinib      INTERVAL HISTORY:  [name] 77 y.o. male returns for routine follow-up for metastatic lung cancer. Patient here today with his wife.  Denies any major changes from his baseline cough.  Denies any hemoptysis.  Denies any fevers, infections.  Denies any ER visits.  His right neck pain is better with Lyrica twice daily.  He is tolerating dabrafenib and trametinib very well.  Denies nausea, vomiting or diarrhea.  No skin rashes reported.   REVIEW OF SYSTEMS:  Review of Systems  Constitutional: Negative.   HENT:  Negative.   Eyes: Negative.   Respiratory: Positive for cough.   Cardiovascular: Negative.   Gastrointestinal: Negative.   Endocrine: Negative.   Genitourinary: Negative.    Musculoskeletal: Negative.   Skin: Negative.   Neurological: Negative.   Hematological: Negative.   Psychiatric/Behavioral: Negative.   All other systems reviewed and are negative.    PAST MEDICAL/SURGICAL HISTORY:  Past Medical History:  Diagnosis Date  . Cancer ([place])   . Chronic kidney disease   . Coronary artery disease   . Diabetes mellitus   . Hypertension   . 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 Spontaneous pneumothorax 09/15/2010   right   Past Surgical History:  Procedure Laterality Date  . ANKLE FRACTURE SURGERY  2008   [facility]  . APPENDECTOMY  1960's  . CHEST TUBE INSERTION Right 09/15/2010   Dr Servando Snare - spontaneous PTX  . CHOLECYSTECTOMY  2008  . COLONOSCOPY N/A 09/07/2017   Procedure: COLONOSCOPY;  Surgeon: Daneil Dolin, MD;  Location: [facility];  Service:  Endoscopy;  Laterality: N/A;  10:30am  . CORONARY ARTERY BYPASS GRAFT  2003  . EYE SURGERY  2001   Cataracts removed bilaterally  . INCISIONAL HERNIA REPAIR N/A 01/29/2015   Procedure: Fatima Blank HERNIORRHAPHY WITH MESH;  Surgeon: Aviva Signs, MD;  Location: [facility];  Service: General;  Laterality: N/A;  . INSERTION OF MESH N/A 01/29/2015   Procedure: INSERTION OF MESH;  Surgeon: Aviva Signs, MD;  Location: [facility];  Service: General;  Laterality: N/A;  . [place]  . LEFT HEART CATH AND CORS/GRAFTS ANGIOGRAPHY N/A 06/20/2017   Procedure: LEFT HEART CATH AND CORS/GRAFTS ANGIOGRAPHY;  Surgeon: Martinique, Peter M, MD;  Location: [facility];  Service: Cardiovascular;  Laterality: N/A;  . ORIF ANKLE FRACTURE  08/26/2011   Procedure: OPEN REDUCTION INTERNAL FIXATION (ORIF) ANKLE FRACTURE;  Surgeon: Sanjuana Kava, MD;  Location: [facility];  Service: Orthopedics;  Laterality: Right;  . POLYPECTOMY  09/07/2017   Procedure: POLYPECTOMY;  Surgeon: Daneil Dolin, MD;  Location: [facility];  Service: Endoscopy;;  ascending x2 (cold snare)  . PROSTATE SURGERY  2012   Enlarged prostate  . TRANSURETHRAL RESECTION OF PROSTATE  2012     SOCIAL HISTORY:  Social History   Socioeconomic History  . Marital status: Married    Spouse name: Not on file  . Number of children: Not on file  . Years of education: Not on file  .  Highest education level: Not on file  Occupational History  . Not on file  Social Needs  . Financial resource strain: Not on file  . Food insecurity:    Worry: Not on file    Inability: Not on file  . Transportation needs:    Medical: Not on file    Non-medical: Not on file  Tobacco Use  . Smoking status: Former Smoker    Packs/day: 1.00    Years: 33.00    Pack years: 33.00    Last attempt to quit: 05/17/1987    Years since quitting: 30.6  . Smokeless tobacco: Never Used  Substance and Sexual Activity  . Alcohol use: No  . Drug use: No   . Sexual activity: Yes  Lifestyle  . Physical activity:    Days per week: Not on file    Minutes per session: Not on file  . Stress: Not on file  Relationships  . Social connections:    Talks on phone: Not on file    Gets together: Not on file    Attends religious service: Not on file    Active member of club or organization: Not on file    Attends meetings of clubs or organizations: Not on file    Relationship status: Not on file  . Intimate partner violence:    Fear of current or ex partner: Not on file    Emotionally abused: Not on file    Physically abused: Not on file    Forced sexual activity: Not on file  Other Topics Concern  . Not on file  Social History Narrative  . Not on file    FAMILY HISTORY:  Family History  Problem Relation Age of Onset  . Aneurysm Mother   . Heart attack Father   . Colon cancer Maternal Uncle   . Cancer Brother   . Gastric cancer Neg Hx   . Esophageal cancer Neg Hx     CURRENT MEDICATIONS:  Outpatient Encounter Medications as of 12/22/2017  Medication Sig  . amLODipine (NORVASC) 10 MG tablet Take 1 tablet (10 mg total) by mouth daily.  Marland Kitchen aspirin EC 81 MG tablet Take 81 mg by mouth daily.  . Cholecalciferol (VITAMIN D3) 5000 units CAPS Take 5,000 Units by mouth daily.  Marland Kitchen dabrafenib mesylate (TAFLINAR) 50 MG capsule Take 2 capsules (100 mg total) by mouth 2 (two) times daily. Take on an empty stomach 1 hour before or 2 hours after meals.  . gabapentin (NEURONTIN) 300 MG capsule   . glimepiride (AMARYL) 2 MG tablet Take 2 mg by mouth daily with breakfast.   . metFORMIN (GLUCOPHAGE) 500 MG tablet Take 1 tablet (500 mg total) by mouth 2 (two) times daily with a meal.  . metoprolol (LOPRESSOR) 100 MG tablet Take 100 mg by mouth 2 (two) times daily.  . nitroGLYCERIN (NITROSTAT) 0.4 MG SL tablet Place 0.4 mg under the tongue every 5 (five) minutes as needed for chest pain.   Marland Kitchen ondansetron (ZOFRAN) 8 MG tablet Take 1 tablet (8 mg total) by  mouth every 8 (eight) hours as needed for nausea or vomiting.  . pioglitazone (ACTOS) 45 MG tablet Take 45 mg by mouth daily.  . prochlorperazine (COMPAZINE) 10 MG tablet Take 1 tablet (10 mg total) by mouth every 6 (six) hours as needed for nausea or vomiting.  . sitaGLIPtin (JANUVIA) 50 MG tablet Take 50 mg by mouth daily.   . trametinib dimethyl sulfoxide (MEKINIST) 0.5 MG tablet Take 3 tablets (  1.5 mg total) by mouth daily. Take 1 hour before or 2 hours after a meal. Store refrigerated in original container.  . isosorbide mononitrate (IMDUR) 30 MG 24 hr tablet Take 1 tablet (30 mg total) by mouth daily.   No facility-administered encounter medications on file as of 12/22/2017.     ALLERGIES:  No Known Allergies   PHYSICAL EXAM:  ECOG Performance status: 1  Vitals:   12/22/17 1119  BP: 131/71  Pulse: 62  Resp: 18  SpO2: 98%   Filed Weights   12/22/17 1119  Weight: 189 lb 9.6 oz (86 kg)    Physical Exam No skin rashes noted.  LABORATORY DATA:  I have reviewed the labs as listed.  CBC    Component Value Date/Time   WBC 4.0 12/19/2017 1256   RBC 4.43 12/19/2017 1256   HGB 12.2 (L) 12/19/2017 1256   HCT 36.1 (L) 12/19/2017 1256   PLT 139 (L) 12/19/2017 1256   MCV 81.5 12/19/2017 1256   MCH 27.5 12/19/2017 1256   MCHC 33.8 12/19/2017 1256   RDW 15.5 12/19/2017 1256   LYMPHSABS 1.5 12/19/2017 1256   MONOABS 0.4 12/19/2017 1256   EOSABS 0.0 12/19/2017 1256   BASOSABS 0.0 12/19/2017 1256   CMP Latest Ref Rng & Units 12/19/2017 11/16/2017 10/28/2017  Glucose 70 - 99 mg/dL 174(H) 191(H) 215(H)  BUN 8 - 23 mg/dL 19 26(H) 19  Creatinine 0.61 - 1.24 mg/dL 1.50(H) 1.81(H) 1.71(H)  Sodium 135 - 145 mmol/L 131(L) 133(L) 135  Potassium 3.5 - 5.1 mmol/L 4.8 4.8 4.9  Chloride 98 - 111 mmol/L 99 99 100(L)  CO2 22 - 32 mmol/L '28 28 28  ' Calcium 8.9 - 10.3 mg/dL 8.7(L) 9.0 9.0  Total Protein 6.5 - 8.1 g/dL 6.6 6.9 7.3  Total Bilirubin 0.3 - 1.2 mg/dL 0.6 0.9 0.8  Alkaline Phos 38 -  126 U/L 117 102 103  AST 15 - 41 U/L '21 19 21  ' ALT 0 - 44 U/L 18 18 16(L)    Radiology: -I have reviewed PET CT scan images dated 12/19/2017 and discussed with the patient.    ASSESSMENT & PLAN:   Adenocarcinoma of lung, stage 4 ([place]) 1.  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  He has multiple bilateral lung nodules making him metastatic.  He had a colonoscopy which showed multiple polyps which were tubular adenomas.  His functional status is decent at this time.  He does not have any malignancy related symptoms at this time.   -Dabrafenib 100 mg twice daily and trametinib 1.5 mg once daily (response rates of 65% and median PFS of 10.9 months) on an empty stomach was started on 10/07/2017.  He is not having any problems.  Upon further questioning and confirmation, we found out that he is taking dabrafenib 50 mg twice daily.  2D echocardiogram dated 10/28/2017 which shows ejection fraction of 60%.  I plan to repeat echocardiogram in 2 to 3 months.  He increased dabrafenib 100 mg twice daily on 11/10/2017.  So far he has tolerated very well.  He went to the ER on 11/13/2017 with right occipital pain which improved with injection.  He is taking Lyrica twice daily which is helping with the pain. -I discussed the results of the PET CT scan dated 12/19/2017 which showed decrease in size of the  lung nodules as well as SUV value.  He has gotten excellent response so far.  He will continue dabrafenib 100 mg twice daily and trametinib 1.5 mg once daily. -We will see him back in 4 weeks for follow-up.  2.  Hypertension: He is taking metoprolol 100 mg twice daily.On 10/18/2017, we have added Norvasc 10 mg daily.  His blood pressure is very well controlled.        Orders placed this encounter:  Orders  Placed This Encounter  Procedures  . CBC with Differential  . Comprehensive metabolic panel  . Magnesium      Derek Jack, MD [place] [phone]

## 2017-12-22 NOTE — Assessment & Plan Note (Signed)
1.  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  He has multiple bilateral lung nodules making him metastatic.  He had a colonoscopy which showed multiple polyps which were tubular adenomas.  His functional status is decent at this time.  He does not have any malignancy related symptoms at this time.   -Dabrafenib 100 mg twice daily and trametinib 1.5 mg once daily (response rates of 65% and median PFS of 10.9 months) on an empty stomach was started on 10/07/2017.  He is not having any problems.  Upon further questioning and confirmation, we found out that he is taking dabrafenib 50 mg twice daily.  2D echocardiogram dated 10/28/2017 which shows ejection fraction of 60%.  I plan to repeat echocardiogram in 2 to 3 months.  He increased dabrafenib 100 mg twice daily on 11/10/2017.  So far he has tolerated very well.  He went to the ER on 11/13/2017 with right occipital pain which improved with injection.  He is taking Lyrica twice daily which is helping with the pain. -I discussed the results of the PET CT scan dated 12/19/2017 which showed decrease in size of the lung nodules as well as SUV value.  He has gotten excellent response so far.  He will continue dabrafenib 100 mg twice daily and trametinib 1.5 mg once daily. -We will see him back in 4 weeks for follow-up.  2.  Hypertension: He is taking metoprolol 100 mg twice daily.On 10/18/2017, we have added Norvasc 10 mg daily.  His blood pressure is very well controlled.

## 2017-12-27 ENCOUNTER — Encounter (HOSPITAL_COMMUNITY): Payer: Self-pay | Admitting: Emergency Medicine

## 2017-12-27 ENCOUNTER — Emergency Department (HOSPITAL_COMMUNITY): Payer: Medicare Other

## 2017-12-27 ENCOUNTER — Other Ambulatory Visit: Payer: Self-pay

## 2017-12-27 ENCOUNTER — Observation Stay (HOSPITAL_COMMUNITY)
Admission: EM | Admit: 2017-12-27 | Discharge: 2017-12-28 | Disposition: A | Discharge status: Home / Self Care | Payer: Medicare Other | Attending: Family Medicine | Admitting: Family Medicine

## 2017-12-27 DIAGNOSIS — I2510 Atherosclerotic heart disease of native coronary artery without angina pectoris: Secondary | ICD-10-CM | POA: Diagnosis present

## 2017-12-27 DIAGNOSIS — Z8679 Personal history of other diseases of the circulatory system: Secondary | ICD-10-CM

## 2017-12-27 DIAGNOSIS — R0789 Other chest pain: Secondary | ICD-10-CM | POA: Diagnosis not present

## 2017-12-27 DIAGNOSIS — C3490 Malignant neoplasm of unspecified part of unspecified bronchus or lung: Secondary | ICD-10-CM | POA: Diagnosis not present

## 2017-12-27 DIAGNOSIS — E119 Type 2 diabetes mellitus without complications: Secondary | ICD-10-CM

## 2017-12-27 DIAGNOSIS — Z951 Presence of aortocoronary bypass graft: Secondary | ICD-10-CM | POA: Diagnosis not present

## 2017-12-27 DIAGNOSIS — N183 Chronic kidney disease, stage 3 unspecified: Secondary | ICD-10-CM | POA: Diagnosis present

## 2017-12-27 DIAGNOSIS — Z87891 Personal history of nicotine dependence: Secondary | ICD-10-CM | POA: Insufficient documentation

## 2017-12-27 DIAGNOSIS — Z7982 Long term (current) use of aspirin: Secondary | ICD-10-CM | POA: Diagnosis not present

## 2017-12-27 DIAGNOSIS — Z85118 Personal history of other malignant neoplasm of bronchus and lung: Secondary | ICD-10-CM

## 2017-12-27 DIAGNOSIS — R079 Chest pain, unspecified: Secondary | ICD-10-CM | POA: Diagnosis not present

## 2017-12-27 DIAGNOSIS — Z7984 Long term (current) use of oral hypoglycemic drugs: Secondary | ICD-10-CM | POA: Diagnosis not present

## 2017-12-27 DIAGNOSIS — Z79899 Other long term (current) drug therapy: Secondary | ICD-10-CM | POA: Diagnosis not present

## 2017-12-27 DIAGNOSIS — R05 Cough: Secondary | ICD-10-CM | POA: Diagnosis not present

## 2017-12-27 DIAGNOSIS — I129 Hypertensive chronic kidney disease with stage 1 through stage 4 chronic kidney disease, or unspecified chronic kidney disease: Secondary | ICD-10-CM | POA: Insufficient documentation

## 2017-12-27 DIAGNOSIS — R0602 Shortness of breath: Secondary | ICD-10-CM | POA: Diagnosis not present

## 2017-12-27 LAB — CBC
HCT: 36.5 % — ABNORMAL LOW (ref 39.0–52.0)
Hemoglobin: 12.8 g/dL — ABNORMAL LOW (ref 13.0–17.0)
MCH: 28.5 pg (ref 26.0–34.0)
MCHC: 35.1 g/dL (ref 30.0–36.0)
MCV: 81.3 fL (ref 78.0–100.0)
Platelets: 115 10*3/uL — ABNORMAL LOW (ref 150–400)
RBC: 4.49 MIL/uL (ref 4.22–5.81)
RDW: 15.3 % (ref 11.5–15.5)
WBC: 3.9 10*3/uL — ABNORMAL LOW (ref 4.0–10.5)

## 2017-12-27 LAB — GLUCOSE, CAPILLARY
GLUCOSE-CAPILLARY: 251 mg/dL — AB (ref 70–99)
Glucose-Capillary: 295 mg/dL — ABNORMAL HIGH (ref 70–99)
Glucose-Capillary: 319 mg/dL — ABNORMAL HIGH (ref 70–99)
Glucose-Capillary: 314 mg/dL — ABNORMAL HIGH (ref 70–99)

## 2017-12-27 LAB — BASIC METABOLIC PANEL
Anion gap: 6 (ref 5–15)
BUN: 18 mg/dL (ref 8–23)
CALCIUM: 8.8 mg/dL — AB (ref 8.9–10.3)
CO2: 29 mmol/L (ref 22–32)
Chloride: 98 mmol/L (ref 98–111)
Creatinine, Ser: 1.59 mg/dL — ABNORMAL HIGH (ref 0.61–1.24)
GFR calc Af Amer: 46 mL/min — ABNORMAL LOW (ref >60)
GFR, EST NON AFRICAN AMERICAN: 40 mL/min — AB (ref >60)
GLUCOSE: 196 mg/dL — AB (ref 70–99)
Potassium: 4.5 mmol/L (ref 3.5–5.1)
SODIUM: 133 mmol/L — AB (ref 135–145)

## 2017-12-27 LAB — CBG MONITORING, ED: Glucose-Capillary: 190 mg/dL — ABNORMAL HIGH (ref 70–99)

## 2017-12-27 LAB — TROPONIN I
Troponin I: <0.03 ng/mL (ref <0.03)
Troponin I: <0.03 ng/mL (ref <0.03)
Troponin I: <0.03 ng/mL (ref <0.03)

## 2017-12-27 LAB — I-STAT TROPONIN, ED: TROPONIN I, POC: 0 ng/mL (ref 0.00–0.08)

## 2017-12-27 MED ORDER — MORPHINE SULFATE (PF) 2 MG/ML IV SOLN: 2.0000 mg | INTRAVENOUS | Status: DC | PRN

## 2017-12-27 MED ORDER — NITROGLYCERIN 0.4 MG/SPRAY TL SOLN
1.0000 | Status: DC | PRN
  Filled 2017-12-27: qty 4.9

## 2017-12-27 MED ORDER — ONDANSETRON HCL 4 MG/2ML IJ SOLN: 4.0000 mg | Freq: Four times a day (QID) | INTRAMUSCULAR | Status: DC | PRN

## 2017-12-27 MED ORDER — TRAMETINIB DIMETHYL SULFOXIDE 0.5 MG PO TABS
1.5000 mg | ORAL_TABLET | Freq: Every day | ORAL | Status: DC
  Administered 2017-12-28: 1.5 mg via ORAL

## 2017-12-27 MED ORDER — NITROGLYCERIN 0.4 MG SL SUBL: 0.4000 mg | SUBLINGUAL_TABLET | SUBLINGUAL | Status: DC | PRN

## 2017-12-27 MED ORDER — ACETAMINOPHEN 325 MG PO TABS: 650.0000 mg | ORAL_TABLET | ORAL | Status: DC | PRN

## 2017-12-27 MED ORDER — METOPROLOL TARTRATE 50 MG PO TABS
100.0000 mg | ORAL_TABLET | Freq: Two times a day (BID) | ORAL | Status: DC
  Administered 2017-12-27 – 2017-12-28 (×3): 100 mg via ORAL
  Filled 2017-12-27 (×3): qty 2

## 2017-12-27 MED ORDER — ASPIRIN EC 81 MG PO TBEC
81.0000 mg | DELAYED_RELEASE_TABLET | Freq: Every day | ORAL | Status: DC
  Administered 2017-12-28: 81 mg via ORAL
  Filled 2017-12-27: qty 1

## 2017-12-27 MED ORDER — DABRAFENIB MESYLATE 50 MG PO CAPS: 100.0000 mg | ORAL_CAPSULE | Freq: Two times a day (BID) | ORAL | Status: DC

## 2017-12-27 MED ORDER — AMLODIPINE BESYLATE 5 MG PO TABS
10.0000 mg | ORAL_TABLET | Freq: Every day | ORAL | Status: DC
  Administered 2017-12-27 – 2017-12-28 (×2): 10 mg via ORAL
  Filled 2017-12-27 (×2): qty 2

## 2017-12-27 MED ORDER — ENOXAPARIN SODIUM 40 MG/0.4ML ~~LOC~~ SOLN
40.0000 mg | SUBCUTANEOUS | Status: DC
  Administered 2017-12-27: 40 mg via SUBCUTANEOUS
  Filled 2017-12-27: qty 0.4

## 2017-12-27 MED ORDER — ASPIRIN 81 MG PO CHEW
324.0000 mg | CHEWABLE_TABLET | Freq: Once | ORAL | Status: AC
  Administered 2017-12-27: 324 mg via ORAL
  Filled 2017-12-27: qty 4

## 2017-12-27 MED ORDER — ISOSORBIDE MONONITRATE ER 60 MG PO TB24
30.0000 mg | ORAL_TABLET | Freq: Every day | ORAL | Status: DC
  Administered 2017-12-27 – 2017-12-28 (×2): 30 mg via ORAL
  Filled 2017-12-27 (×2): qty 1

## 2017-12-27 MED ORDER — LINAGLIPTIN 5 MG PO TABS
5.0000 mg | ORAL_TABLET | Freq: Every day | ORAL | Status: DC
  Administered 2017-12-27: 5 mg via ORAL
  Filled 2017-12-27: qty 1

## 2017-12-27 MED ORDER — PIOGLITAZONE HCL 15 MG PO TABS
45.0000 mg | ORAL_TABLET | Freq: Every day | ORAL | Status: DC
  Administered 2017-12-27: 45 mg via ORAL
  Filled 2017-12-27 (×2): qty 3

## 2017-12-27 MED ORDER — ONDANSETRON HCL 4 MG/2ML IJ SOLN
4.0000 mg | Freq: Once | INTRAMUSCULAR | Status: AC
  Administered 2017-12-27: 4 mg via INTRAVENOUS
  Filled 2017-12-27: qty 2

## 2017-12-27 MED ORDER — DABRAFENIB MESYLATE 50 MG PO CAPS
100.0000 mg | ORAL_CAPSULE | Freq: Two times a day (BID) | ORAL | Status: DC
  Administered 2017-12-27 – 2017-12-28 (×2): 100 mg via ORAL

## 2017-12-27 MED ORDER — MORPHINE SULFATE (PF) 4 MG/ML IV SOLN
3.0000 mg | Freq: Once | INTRAVENOUS | Status: AC
  Administered 2017-12-27: 3 mg via INTRAVENOUS
  Filled 2017-12-27: qty 1

## 2017-12-27 MED ORDER — GLIMEPIRIDE 2 MG PO TABS: 2.0000 mg | ORAL_TABLET | Freq: Every day | ORAL | Status: DC

## 2017-12-27 NOTE — H&P (Signed)
History and Physical    MYLON MABEY GXQ:[id number] DOB: May 08, 1941 DOA: 12/27/2017  Referring MD/NP/PA: Veryl Speak, EDP PCP: [name], MD  Patient coming from: Home  Chief Complaint: Chest pain, "I felt like I was leaving this world"  HPI: [name] is a 77 y.o. male with history of stage IV lung cancer currently undergoing oral chemotherapy under the care of Dr. Heber Keota at the [facility], he also has a history of coronary artery disease status post CABG x4 in 2003.  He underwent coronary angiography in February 2019 which demonstrated severe native three-vessel obstructive coronary disease, patent LIMA to the LAD, and severe SVG disease with a 99% proximal SVG stenosis to the diagonal with TIMI I flow and multiple high-grade lesions in the SVG to the OM and SVG to the RCA.  It was the recommendation of cardiology that he have redo CABG.  He was evaluated by Dr. Ihor Gully who did not believe he was a good candidate given his stage IV adenocarcinoma of the lung.  He had an echocardiogram in June 2019 that showed an ejection fraction of 60% with grade indeterminant diastolic dysfunction no wall motion abnormalities.  He states he went to bed around 10 PM last night around 12 was woken up with substernal chest pain he describes it as something heavy on his chest, nonradiating he took 3 doses of nitroglycerin at home and did not have relief came to the hospital for evaluation.  In the ED vital signs are stable, initial troponin is negative and less than 0.03, EKG is without acute ischemic changes.  Asked to admit him for chest pain.  Past Medical/Surgical History: Past Medical History:  Diagnosis Date  . Cancer ([place])   . Chronic kidney disease   . Coronary artery disease   . Diabetes mellitus   . Hypertension   . 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 Spontaneous pneumothorax 09/15/2010   right     Past Surgical History:  Procedure Laterality Date  . ANKLE FRACTURE SURGERY  2008   [facility]  . APPENDECTOMY  1960's  . CHEST TUBE INSERTION Right 09/15/2010   Dr Servando Snare - spontaneous PTX  . CHOLECYSTECTOMY  2008  . COLONOSCOPY N/A 09/07/2017   Procedure: COLONOSCOPY;  Surgeon: Daneil Dolin, MD;  Location[facility];  Service: Endoscopy;  Laterality: N/A;  10:30am  . CORONARY ARTERY BYPASS GRAFT  2003  . EYE SURGERY  2001   Cataracts removed bilaterally  . INCISIONAL HERNIA REPAIR N/A 01/29/2015   Procedure: Fatima Blank HERNIORRHAPHY WITH MESH;  Surgeon: Aviva Signs, MD;  Location: [facility] ORS;  Service: General;  Laterality: N/A;  . INSERTION OF MESH N/A 01/29/2015   Procedure: INSERTION OF MESH;  Surgeon: Aviva Signs, MD;  Location: [facility] ORS;  Service: General;  Laterality: N/A;  . [place]  . LEFT HEART CATH AND CORS/GRAFTS ANGIOGRAPHY N/A 06/20/2017   Procedure: LEFT HEART CATH AND CORS/GRAFTS ANGIOGRAPHY;  Surgeon: Martinique, Peter M, MD;  Location: [place] [facility] LAB;  Service: Cardiovascular;  Laterality: N/A;  . ORIF ANKLE FRACTURE  08/26/2011   Procedure: OPEN REDUCTION INTERNAL FIXATION (ORIF) ANKLE FRACTURE;  Surgeon: Sanjuana Kava, MD;  Location: [facility] ORS;  Service: Orthopedics;  Laterality: Right;  . POLYPECTOMY  09/07/2017   Procedure: POLYPECTOMY;  Surgeon: Gala Romney,  Cristopher Estimable, MD;  Location: [facility];  Service: Endoscopy;;  ascending x2 (cold snare)  . PROSTATE SURGERY  2012   Enlarged prostate  . TRANSURETHRAL RESECTION OF PROSTATE  2012    Social History:  reports that he quit smoking about 30 years ago. He has a 33.00 pack-year smoking history. He has never used smokeless tobacco. He reports that he does not drink alcohol or use drugs.  Allergies: No Known Allergies  Family History:  Family History  Problem Relation Age of Onset  . Aneurysm Mother   . Heart attack Father   . Colon cancer Maternal  Uncle   . Cancer Brother   . Gastric cancer Neg Hx   . Esophageal cancer Neg Hx     Prior to Admission medications   Medication Sig Start Date End Date Taking? Authorizing Provider  amLODipine (NORVASC) 10 MG tablet Take 1 tablet (10 mg total) by mouth daily. 10/18/17  Yes Derek Jack, MD  aspirin EC 81 MG tablet Take 81 mg by mouth daily.   Yes [provider]  Cholecalciferol (VITAMIN D3) 5000 units CAPS Take 5,000 Units by mouth daily.   Yes [provider]  dabrafenib mesylate (TAFLINAR) 50 MG capsule Take 2 capsules (100 mg total) by mouth 2 (two) times daily. Take on an empty stomach 1 hour before or 2 hours after meals. 09/26/17  Yes Derek Jack, MD  glimepiride (AMARYL) 2 MG tablet Take 2 mg by mouth daily with breakfast.    Yes [provider]  isosorbide mononitrate (IMDUR) 30 MG 24 hr tablet Take 1 tablet (30 mg total) by mouth daily. 07/19/17 12/27/17 Yes Herminio Commons, MD  metFORMIN (GLUCOPHAGE) 500 MG tablet Take 1 tablet (500 mg total) by mouth 2 (two) times daily with a meal. 06/23/17  Yes Martinique, Peter M, MD  metoprolol (LOPRESSOR) 100 MG tablet Take 100 mg by mouth 2 (two) times daily.   Yes [provider]  nitroGLYCERIN (NITROSTAT) 0.4 MG SL tablet Place 0.4 mg under the tongue every 5 (five) minutes as needed for chest pain.  06/01/17  Yes [provider]  ondansetron (ZOFRAN) 8 MG tablet Take 1 tablet (8 mg total) by mouth every 8 (eight) hours as needed for nausea or vomiting. 09/27/17  Yes Derek Jack, MD  pioglitazone (ACTOS) 45 MG tablet Take 45 mg by mouth daily.   Yes [provider]  prochlorperazine (COMPAZINE) 10 MG tablet Take 1 tablet (10 mg total) by mouth every 6 (six) hours as needed for nausea or vomiting. 09/27/17  Yes Derek Jack, MD  sitaGLIPtin (JANUVIA) 50 MG tablet Take 50 mg by mouth daily.    Yes [provider]  trametinib dimethyl sulfoxide (MEKINIST) 0.5  MG tablet Take 3 tablets (1.5 mg total) by mouth daily. Take 1 hour before or 2 hours after a meal. Store refrigerated in original container. 09/26/17  Yes Derek Jack, MD    Review of Systems:  Constitutional: Denies fever, chills, diaphoresis, appetite change and fatigue.  HEENT: Denies photophobia, eye pain, redness, hearing loss, ear pain, congestion, sore throat, rhinorrhea, sneezing, mouth sores, trouble swallowing, neck pain, neck stiffness and tinnitus.   Respiratory: Denies SOB, DOE, cough, chest tightness,  and wheezing.   Cardiovascular: Denies chest pain, palpitations and leg swelling.  Gastrointestinal: Denies nausea, vomiting, abdominal pain, diarrhea, constipation, blood in stool and abdominal distention.  Genitourinary: Denies dysuria, urgency, frequency, hematuria, flank pain and difficulty urinating.  Endocrine: Denies: hot or cold intolerance,  sweats, changes in hair or nails, polyuria, polydipsia. Musculoskeletal: Denies myalgias, back pain, joint swelling, arthralgias and gait problem.  Skin: Denies pallor, rash and wound.  Neurological: Denies dizziness, seizures, syncope, weakness, light-headedness, numbness and headaches.  Hematological: Denies adenopathy. Easy bruising, personal or family bleeding history  Psychiatric/Behavioral: Denies suicidal ideation, mood changes, confusion, nervousness, sleep disturbance and agitation    Physical Exam: Vitals:   12/27/17 0630 12/27/17 0645 12/27/17 0700 12/27/17 0802  BP: (!) 146/67  (!) 141/66 (!) 169/74  Pulse:  (!) 58 (!) 57 (!) 55  Resp: (!) 35 (!) 22 18 20   Temp:    98.3 F (36.8 C)  TempSrc:    Oral  SpO2:  99% 97% 96%  Weight:    84.8 kg  Height:    5\' 10"  (1.778 m)     Constitutional: NAD, calm, comfortable Eyes: PERRL, lids and conjunctivae normal ENMT: Mucous membranes are moist. Posterior pharynx clear of any exudate or lesions.Normal dentition.  Neck: normal, supple, no masses, no  thyromegaly Respiratory: clear to auscultation bilaterally, no wheezing, no crackles. Normal respiratory effort. No accessory muscle use.  Cardiovascular: Regular rate and rhythm, no murmurs / rubs / gallops. No extremity edema. 2+ pedal pulses. No carotid bruits.  Abdomen: no tenderness, no masses palpated. No hepatosplenomegaly. Bowel sounds positive.  Musculoskeletal: no clubbing / cyanosis. No joint deformity upper and lower extremities. Good ROM, no contractures. Normal muscle tone.  Skin: no rashes, lesions, ulcers. No induration Neurologic: CN 2-12 grossly intact. Sensation intact, DTR normal. Strength 5/5 in all 4.  Psychiatric: Normal judgment and insight. Alert and oriented x 3. Normal mood.    Labs on Admission: I have personally reviewed the following labs and imaging studies  CBC: Recent Labs  Lab 12/27/17 0224  WBC 3.9*  HGB 12.8*  HCT 36.5*  MCV 81.3  PLT 591*   Basic Metabolic Panel: Recent Labs  Lab 12/27/17 0224  NA 133*  K 4.5  CL 98  CO2 29  GLUCOSE 196*  BUN 18  CREATININE 1.59*  CALCIUM 8.8*   GFR: Estimated Creatinine Clearance: 39.5 mL/min (A) (by C-G formula based on SCr of 1.59 mg/dL (H)). Liver Function Tests: No results for input(s): AST, ALT, ALKPHOS, BILITOT, PROT, ALBUMIN in the last 168 hours. No results for input(s): LIPASE, AMYLASE in the last 168 hours. No results for input(s): AMMONIA in the last 168 hours. Coagulation Profile: No results for input(s): INR, PROTIME in the last 168 hours. Cardiac Enzymes: Recent Labs  Lab 12/27/17 0521  TROPONINI <0.03   BNP (last 3 results) No results for input(s): PROBNP in the last 8760 hours. HbA1C: No results for input(s): HGBA1C in the last 72 hours. CBG: Recent Labs  Lab 12/27/17 0729  GLUCAP 190*   Lipid Profile: No results for input(s): CHOL, HDL, LDLCALC, TRIG, CHOLHDL, LDLDIRECT in the last 72 hours. Thyroid Function Tests: No results for input(s): TSH, T4TOTAL, FREET4,  T3FREE, THYROIDAB in the last 72 hours. Anemia Panel: No results for input(s): VITAMINB12, FOLATE, FERRITIN, TIBC, IRON, RETICCTPCT in the last 72 hours. Urine analysis:    Component Value Date/Time   COLORURINE YELLOW 05/05/2007 [place] 05/05/2007 1658   LABSPEC 1.015 05/05/2007 1658   PHURINE 5.5 05/05/2007 1658   GLUCOSEU 100 (A) 05/05/2007 1658   HGBUR NEGATIVE 05/05/2007 [place] 05/05/2007 [place] 05/05/2007 1658   PROTEINUR NEGATIVE 05/05/2007 1658   UROBILINOGEN 0.2 05/05/2007 1658   NITRITE  NEGATIVE 05/05/2007 1658   LEUKOCYTESUR  05/05/2007 1658    NEGATIVE MICROSCOPIC NOT DONE ON URINES WITH NEGATIVE PROTEIN, BLOOD, LEUKOCYTES, NITRITE, OR GLUCOSE <1000 mg/dL.   Sepsis Labs: @LABRCNTIP (procalcitonin:4,lacticidven:4) )No results found for this or any previous visit (from the past 240 hour(s)).   Radiological Exams on Admission: Dg Chest 2 View  Result Date: 12/27/2017 CLINICAL DATA:  Chest pain and cough since 10 p.m. last night. History of diabetes, lung cancer, heart disease. Former smoker. EXAM: CHEST - 2 VIEW COMPARISON:  08/18/2017 FINDINGS: Postoperative changes in the mediastinum. Heart size and pulmonary vascularity are normal for technique. Shallow inspiration. Lungs are clear. No blunting of costophrenic angles. No pneumothorax. Mediastinal contours appear intact. Calcification of the aorta. IMPRESSION: Shallow inspiration. No evidence of active pulmonary disease. Aortic atherosclerosis. Electronically Signed   By: Lucienne Capers M.D.   On: 12/27/2017 03:02    EKG: Independently reviewed.  Normal sinus rhythm with some PACs, some nonspecific T wave changes that are old as compared to prior  Assessment/Plan Principal Problem:   Chest pain Active Problems:   Type 2 diabetes mellitus without complication, without long-term current use of insulin (HCC)   Coronary atherosclerosis   CKD (chronic kidney  disease) stage 3, GFR 30-59 ml/min (HCC)   S/P CABG x 4   Adenocarcinoma of lung, stage 4 (HCC)    Chest pain in patient with known coronary artery disease -As above he has severe three-vessel disease, was referred to thoracic surgery who deemed him to not be a surgical candidate due to his metastatic lung cancer. -We will admit to telemetry, cycle troponins, no need to redo echo as he had an echo in June. -Suspect discharge home in a.m. -No cardiology consultation unless troponins were to be markedly positive.  Stage IV adenocarcinoma of the lung -Continue Tafinlar and Mekinist. -Outpatient follow-up with the [facility] as scheduled.  Type 2 diabetes -On oral medications, will continue with the exception of metformin, monitor CBGs.  Stage III chronic kidney disease -Baseline creatinine between 1.5 and 1.8, creatinine is currently at baseline at 1.59 on admission.   DVT prophylaxis: Lovenox Code Status: Full code Family Communication: Wife and son at bedside updated on plan of care and all questions answered Disposition Plan: Likely home in 24 hours Consults called: None Admission status: It is my clinical opinion that referral for OBSERVATION is reasonable and necessary in this patient based on the above information provided. The aforementioned taken together are felt to place the patient at high risk for further clinical deterioration. However it is anticipated that the patient may be medically stable for discharge from the hospital within 24 to 48 hours.      Time Spent: 85 minutes  Estela Isaac Bliss MD [facility] Pager [phone]  If 7PM-7AM, please contact night-coverage [email or web address] Password [facility]  12/27/2017, 11:15 AM

## 2017-12-27 NOTE — ED Notes (Addendum)
Pt took cancer medications. Hospitalist made aware.  Tafinar and Mekinist

## 2017-12-27 NOTE — Care Management Obs Status (Signed)
[place] NOTIFICATION   Patient Details  Name: MYLAN SCHWARZ MRN: [id number] Date of Birth: [birth year]   Medicare Observation Status Notification Given:  Yes    Sherald Barge, RN 12/27/2017, 3:44 PM

## 2017-12-27 NOTE — ED Provider Notes (Signed)
[facility] EMERGENCY DEPARTMENT Provider Note   CSN: [id number] Arrival date & time: 12/27/17  0214     History   Chief Complaint Chief Complaint  Patient presents with  . Chest Pain    HPI [name] is a 77 y.o. male.  Patient is a 77 year old male with past medical history of coronary artery disease with prior CABG, lung cancer, diabetes, and hypertension.  He presents today with complaints of chest pain.  This started approximately 2 AM while attempting to go to sleep.  He tried nitroglycerin at home with little relief.  His wife brought him here and his symptoms began to improve.  He reports associated shortness of breath, but denies nausea, diaphoresis, or radiation.  He is unable to express whether this is similar to his prior cardiac pain.  He has had bypass surgery approximately 16 years ago and has known lesions that have not been intervened upon secondary to his diagnosis of lung cancer.  The history is provided by the patient.  Chest Pain   This is a new problem. The current episode started 1 to 2 hours ago. The problem occurs constantly. The problem has been rapidly improving. The pain is present in the substernal region. The pain is moderate. The quality of the pain is described as pressure-like. The pain does not radiate. Associated symptoms include shortness of breath. Pertinent negatives include no diaphoresis and no nausea.    Past Medical History:  Diagnosis Date  . Cancer ([place])   . Chronic kidney disease   . Coronary artery disease   . Diabetes mellitus   . Hypertension   . 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 Spontaneous pneumothorax 09/15/2010   right    Patient Active Problem List   Diagnosis Date Noted  . Adenocarcinoma of lung, stage 4 ([place]) 09/08/2017  . Abnormal CT of the abdomen 09/05/2017  . CKD (chronic kidney disease) stage 3, GFR 30-59 ml/min (HCC)  06/20/2017  . Type 2 diabetes mellitus without complication, without long-term current use of insulin ([place]) 11/04/2008  . Coronary atherosclerosis 11/04/2008  . Angina pectoris ([place]) 11/04/2008  . 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 ANKLE FRACTURE SURGERY  2008   [facility]  . APPENDECTOMY  1960's  . CHEST TUBE INSERTION Right 09/15/2010   Dr Servando Snare - spontaneous PTX  . CHOLECYSTECTOMY  2008  . COLONOSCOPY N/A 09/07/2017   Procedure: COLONOSCOPY;  Surgeon: Daneil Dolin, MD;  Location[facility];  Service: Endoscopy;  Laterality: N/A;  10:30am  . CORONARY ARTERY BYPASS GRAFT  2003  . EYE SURGERY  2001   Cataracts removed bilaterally  . INCISIONAL HERNIA REPAIR N/A 01/29/2015   Procedure: Fatima Blank HERNIORRHAPHY WITH MESH;  Surgeon: Aviva Signs, MD;  Location: [facility] ORS;  Service: General;  Laterality: N/A;  . INSERTION OF MESH N/A 01/29/2015   Procedure: INSERTION OF MESH;  Surgeon: Aviva Signs, MD;  Location: [facility] ORS;  Service: General;  Laterality: N/A;  . [place]  . LEFT HEART CATH AND CORS/GRAFTS ANGIOGRAPHY N/A 06/20/2017   Procedure: LEFT HEART CATH AND CORS/GRAFTS ANGIOGRAPHY;  Surgeon: Martinique, Peter M, MD;  Location: [place] CV LAB;  Service: Cardiovascular;  Laterality: N/A;  . ORIF ANKLE FRACTURE  08/26/2011  Procedure: OPEN REDUCTION INTERNAL FIXATION (ORIF) ANKLE FRACTURE;  Surgeon: Sanjuana Kava, MD;  Location: [facility] ORS;  Service: Orthopedics;  Laterality: Right;  . POLYPECTOMY  09/07/2017   Procedure: POLYPECTOMY;  Surgeon: Daneil Dolin, MD;  Location: [facility];  Service: Endoscopy;;  ascending x2 (cold snare)  . PROSTATE SURGERY  2012   Enlarged prostate  . TRANSURETHRAL RESECTION OF PROSTATE  2012        Home Medications    Prior to Admission medications   Medication Sig Start Date End Date Taking? Authorizing Provider  amLODipine (NORVASC)  10 MG tablet Take 1 tablet (10 mg total) by mouth daily. 10/18/17  Yes Derek Jack, MD  aspirin EC 81 MG tablet Take 81 mg by mouth daily.   Yes [provider]  Cholecalciferol (VITAMIN D3) 5000 units CAPS Take 5,000 Units by mouth daily.   Yes [provider]  dabrafenib mesylate (TAFLINAR) 50 MG capsule Take 2 capsules (100 mg total) by mouth 2 (two) times daily. Take on an empty stomach 1 hour before or 2 hours after meals. 09/26/17  Yes Derek Jack, MD  gabapentin (NEURONTIN) 300 MG capsule  11/22/17  Yes [provider]  glimepiride (AMARYL) 2 MG tablet Take 2 mg by mouth daily with breakfast.    Yes [provider]  metFORMIN (GLUCOPHAGE) 500 MG tablet Take 1 tablet (500 mg total) by mouth 2 (two) times daily with a meal. 06/23/17  Yes Martinique, Peter M, MD  metoprolol (LOPRESSOR) 100 MG tablet Take 100 mg by mouth 2 (two) times daily.   Yes [provider]  nitroGLYCERIN (NITROSTAT) 0.4 MG SL tablet Place 0.4 mg under the tongue every 5 (five) minutes as needed for chest pain.  06/01/17  Yes [provider]  ondansetron (ZOFRAN) 8 MG tablet Take 1 tablet (8 mg total) by mouth every 8 (eight) hours as needed for nausea or vomiting. 09/27/17  Yes Derek Jack, MD  pioglitazone (ACTOS) 45 MG tablet Take 45 mg by mouth daily.   Yes [provider]  prochlorperazine (COMPAZINE) 10 MG tablet Take 1 tablet (10 mg total) by mouth every 6 (six) hours as needed for nausea or vomiting. 09/27/17  Yes Derek Jack, MD  sitaGLIPtin (JANUVIA) 50 MG tablet Take 50 mg by mouth daily.    Yes [provider]  trametinib dimethyl sulfoxide (MEKINIST) 0.5 MG tablet Take 3 tablets (1.5 mg total) by mouth daily. Take 1 hour before or 2 hours after a meal. Store refrigerated in original container. 09/26/17  Yes Derek Jack, MD  isosorbide mononitrate (IMDUR) 30 MG 24 hr tablet Take 1 tablet (30 mg total) by mouth  daily. 07/19/17 10/19/17  Herminio Commons, MD    Family History Family History  Problem Relation Age of Onset  . Aneurysm Mother   . Heart attack Father   . Colon cancer Maternal Uncle   . Cancer Brother   . Gastric cancer Neg Hx   . Esophageal cancer Neg Hx     Social History Social History   Tobacco Use  . Smoking status: Former Smoker    Packs/day: 1.00    Years: 33.00    Pack years: 33.00    Last attempt to quit: 05/17/1987    Years since quitting: 30.6  . Smokeless tobacco: Never Used  Substance Use Topics  . Alcohol use: No  . Drug use: No     Allergies   Patient has no known allergies.   Review of  Systems Review of Systems  Constitutional: Negative for diaphoresis.  Respiratory: Positive for shortness of breath.   Cardiovascular: Positive for chest pain.  Gastrointestinal: Negative for nausea.  All other systems reviewed and are negative.    Physical Exam Updated Vital Signs BP (!) 186/75   Pulse 71   Temp (!) 97.5 F (36.4 C) (Oral)   Resp 16   Wt 86 kg   SpO2 98%   BMI 27.20 kg/m   Physical Exam  Constitutional: He is oriented to person, place, and time. He appears well-developed and well-nourished. No distress.  HENT:  Head: Normocephalic and atraumatic.  Mouth/Throat: Oropharynx is clear and moist.  Neck: Normal range of motion. Neck supple.  Cardiovascular: Normal rate and regular rhythm. Exam reveals no friction rub.  No murmur heard. Pulmonary/Chest: Effort normal and breath sounds normal. No respiratory distress. He has no wheezes. He has no rales.  Abdominal: Soft. Bowel sounds are normal. He exhibits no distension. There is no tenderness.  Musculoskeletal: Normal range of motion. He exhibits no edema.  Neurological: He is alert and oriented to person, place, and time. Coordination normal.  Skin: Skin is warm and dry. He is not diaphoretic.  Nursing note and vitals reviewed.    ED Treatments / Results  Labs (all labs ordered  are listed, but only abnormal results are displayed) Labs Reviewed  BASIC METABOLIC PANEL - Abnormal; Notable for the following components:      Result Value   Sodium 133 (*)    Glucose, Bld 196 (*)    Creatinine, Ser 1.59 (*)    Calcium 8.8 (*)    GFR calc non Af Amer 40 (*)    GFR calc Af Amer 46 (*)    All other components within normal limits  CBC - Abnormal; Notable for the following components:   WBC 3.9 (*)    Hemoglobin 12.8 (*)    HCT 36.5 (*)    Platelets 115 (*)    All other components within normal limits  I-STAT TROPONIN, ED    EKG EKG Interpretation  Date/Time:  Tuesday December 27 2017 02:23:18 EDT Ventricular Rate:  70 PR Interval:    QRS Duration: 90 QT Interval:  360 QTC Calculation: 389 R Axis:   14 Text Interpretation:  Sinus rhythm Atrial premature complex Short PR interval Nonspecific T abnormalities, lateral leads Confirmed by Veryl Speak [phone]) on 12/27/2017 3:22:39 AM   Radiology Dg Chest 2 View  Result Date: 12/27/2017 CLINICAL DATA:  Chest pain and cough since 10 p.m. last night. History of diabetes, lung cancer, heart disease. Former smoker. EXAM: CHEST - 2 VIEW COMPARISON:  08/18/2017 FINDINGS: Postoperative changes in the mediastinum. Heart size and pulmonary vascularity are normal for technique. Shallow inspiration. Lungs are clear. No blunting of costophrenic angles. No pneumothorax. Mediastinal contours appear intact. Calcification of the aorta. IMPRESSION: Shallow inspiration. No evidence of active pulmonary disease. Aortic atherosclerosis. Electronically Signed   By: Lucienne Capers M.D.   On: 12/27/2017 03:02    Procedures Procedures (including critical care time)  Medications Ordered in ED Medications - No data to display   Initial Impression / Assessment and Plan / ED Course  I have reviewed the triage vital signs and the nursing notes.  Pertinent labs & imaging results that were available during my care of the patient were  reviewed by me and considered in my medical decision making (see chart for details).  Initial cardiac enzymes are negative and EKG is unchanged.  Coronary  artery lesions for which bypass was recommended, but not doable secondary to his lung cancer.  I have spoken with Dr. Olevia Bowens and the patient will be admitted for observation and rule out of MI.  Final Clinical Impressions(s) / ED Diagnoses   Final diagnoses:  None    ED Discharge Orders    None       Veryl Speak, MD 12/27/17 [phone]

## 2017-12-27 NOTE — Progress Notes (Signed)
Inpatient Diabetes Program Recommendations  AACE/ADA: New Consensus Statement on Inpatient Glycemic Control (2015)  Target Ranges:  Prepandial:   less than 140 mg/dL      Peak postprandial:   less than 180 mg/dL (1-2 hours)      Critically ill patients:  140 - 180 mg/dL    Review of Glycemic Control  Diabetes history: DM2 Outpatient Diabetes medications: Amaryl 2 mg qd + Actos 45 mg qd + Januvia 50 mg qd + Metformin 500 mg bid Current orders for Inpatient glycemic control: Amaryl 2 mg qd + Actos 45 mg qd + Tradjenta  Inpatient Diabetes Program Recommendations:   While in the hospital: Consider D/C oral DM medications and transition to basal insulin + Novolog correction moderate scale tid + hs 0-5 units  Thank you, Bethena Roys E. Randy Castrejon, RN, MSN, CDE  Diabetes Coordinator Inpatient Glycemic Control Team Team Pager [phone] (8am-5pm) 12/27/2017 1:03 PM

## 2017-12-27 NOTE — ED Triage Notes (Signed)
Pt states he started having L sided chest pain around 2200 last night. Pt has had a total of 3 NTG without relief.

## 2017-12-28 DIAGNOSIS — N183 Chronic kidney disease, stage 3 (moderate): Secondary | ICD-10-CM

## 2017-12-28 DIAGNOSIS — R079 Chest pain, unspecified: Secondary | ICD-10-CM | POA: Diagnosis not present

## 2017-12-28 DIAGNOSIS — C3490 Malignant neoplasm of unspecified part of unspecified bronchus or lung: Secondary | ICD-10-CM

## 2017-12-28 DIAGNOSIS — E119 Type 2 diabetes mellitus without complications: Secondary | ICD-10-CM

## 2017-12-28 DIAGNOSIS — Z951 Presence of aortocoronary bypass graft: Secondary | ICD-10-CM | POA: Diagnosis not present

## 2017-12-28 LAB — GLUCOSE, CAPILLARY: Glucose-Capillary: 294 mg/dL — ABNORMAL HIGH (ref 70–99)

## 2017-12-28 MED ORDER — INSULIN ASPART 100 UNIT/ML ~~LOC~~ SOLN
0.0000 [IU] | Freq: Three times a day (TID) | SUBCUTANEOUS | Status: DC
  Administered 2017-12-28: 8 [IU] via SUBCUTANEOUS

## 2017-12-28 MED ORDER — INSULIN ASPART 100 UNIT/ML ~~LOC~~ SOLN
5.0000 [IU] | Freq: Three times a day (TID) | SUBCUTANEOUS | Status: DC
  Administered 2017-12-28: 5 [IU] via SUBCUTANEOUS

## 2017-12-28 MED ORDER — INSULIN GLARGINE 100 UNIT/ML ~~LOC~~ SOLN
14.0000 [IU] | Freq: Every day | SUBCUTANEOUS | Status: DC
  Administered 2017-12-28: 14 [IU] via SUBCUTANEOUS
  Filled 2017-12-28 (×2): qty 0.14

## 2017-12-28 MED ORDER — INSULIN ASPART 100 UNIT/ML ~~LOC~~ SOLN: 0.0000 [IU] | Freq: Every day | SUBCUTANEOUS | Status: DC

## 2017-12-28 NOTE — Discharge Summary (Signed)
Physician Discharge Summary  [name] MWN:[id number] DOB: 1940-07-03 DOA: 12/27/2017  PCP: [name], MD ONCOLOGIST: Delton Coombes  Admit date: 12/27/2017 Discharge date: 12/28/2017  Admitted From: HOME  Disposition: HOME  Recommendations for Outpatient Follow-up:  1. Follow up with PCP in 1 weeks 2. FOLLOW UP WITH CARDIOLOGIST IN 2 WEEKS 3. FOLLOW UP WITH ENDOCRINOLOGIST IN 2 WEEKS 4. FOLLOW UP WITH ONCOLOGIST AS SCHEDULED 5. Please obtain BMP/CBC in 1-2 weeks  Discharge Condition: STABLE  CODE STATUS: FULL    Brief Hospitalization Summary: Please see all hospital notes, images, labs for full details of the hospitalization.  HPI: [name] is a 77 y.o. male with history of stage IV lung cancer currently undergoing oral chemotherapy under the care of Dr. Heber [place] at the [facility], he also has a history of coronary artery disease status post CABG x4 in 2003.  He underwent coronary angiography in February 2019 which demonstrated severe native three-vessel obstructive coronary disease, patent LIMA to the LAD, and severe SVG disease with a 99% proximal SVG stenosis to the diagonal with TIMI I flow and multiple high-grade lesions in the SVG to the OM and SVG to the RCA.  It was the recommendation of cardiology that he have redo CABG.  He was evaluated by Dr. Ihor Gully who did not believe he was a good candidate given his stage IV adenocarcinoma of the lung.  He had an echocardiogram in June 2019 that showed an ejection fraction of 60% with grade indeterminant diastolic dysfunction no wall motion abnormalities.  He states he went to bed around 10 PM last night around 12 was woken up with substernal chest pain he describes it as something heavy on his chest, nonradiating he took 3 doses of nitroglycerin at home and did not have relief came to the hospital for evaluation.  In the ED vital signs are stable, initial troponin is negative and less than 0.03, EKG is without acute ischemic  changes.  Asked to admit him for chest pain.  Chest pain in patient with known coronary artery disease -As above he has severe three-vessel disease, was referred to thoracic surgery who deemed him to not be a surgical candidate due to his metastatic lung cancer. -HE WAS admitted to telemetry, troponins negative x 3, no need to redo echo as he had an echo in June. -Discharge home in a.m. -No cardiology consultation unless troponins were to be markedly positive and they were negative.  Stage IV adenocarcinoma of the lung -Continue Tafinlar and Mekinist. -Outpatient follow-up with the [place] as scheduled.  Type 2 diabetes -On oral medications, will continue with the exception of metformin, monitor CBGs. - I have asked him to see endocrinology as his blood sugars are poorly controlled on all the oral medications.  He was given instructions to monitor his blood glucose closely and report to his primary care physician.  The patient verbalized understanding.  Stage III chronic kidney disease -Baseline creatinine between 1.5 and 1.8, creatinine is currently at baseline at 1.59 on admission.   DVT prophylaxis: Lovenox Code Status: Full code Family Communication: Wife and son at bedside updated on plan of care and all questions answered Disposition Plan: HOME  Consults called: None  Discharge Diagnoses:  Principal Problem:   Chest pain Active Problems:   Type 2 diabetes mellitus without complication, without long-term current use of insulin (HCC)   Coronary atherosclerosis   CKD (chronic kidney disease) stage 3, GFR 30-59 ml/min ([place])  S/P CABG x 4   Adenocarcinoma of lung, stage 4 (HCC)    Discharge Instructions: Discharge Instructions    Call MD for:  difficulty breathing, headache or visual disturbances   Complete by:  As directed    Call MD for:  extreme fatigue   Complete by:  As directed    Call MD for:  persistant dizziness or light-headedness    Complete by:  As directed    Call MD for:  persistant nausea and vomiting   Complete by:  As directed    Call MD for:  severe uncontrolled pain   Complete by:  As directed    Increase activity slowly   Complete by:  As directed      Allergies as of 12/28/2017   No Known Allergies     Medication List    TAKE these medications   amLODipine 10 MG tablet Commonly known as:  NORVASC Take 1 tablet (10 mg total) by mouth daily.   aspirin EC 81 MG tablet Take 81 mg by mouth daily.   dabrafenib mesylate 50 MG capsule Commonly known as:  TAFLINAR Take 2 capsules (100 mg total) by mouth 2 (two) times daily. Take on an empty stomach 1 hour before or 2 hours after meals.   glimepiride 2 MG tablet Commonly known as:  AMARYL Take 2 mg by mouth daily with breakfast.   isosorbide mononitrate 30 MG 24 hr tablet Commonly known as:  IMDUR Take 1 tablet (30 mg total) by mouth daily.   metFORMIN 500 MG tablet Commonly known as:  GLUCOPHAGE Take 1 tablet (500 mg total) by mouth 2 (two) times daily with a meal.   metoprolol tartrate 100 MG tablet Commonly known as:  LOPRESSOR Take 100 mg by mouth 2 (two) times daily.   nitroGLYCERIN 0.4 MG SL tablet Commonly known as:  NITROSTAT Place 0.4 mg under the tongue every 5 (five) minutes as needed for chest pain.   ondansetron 8 MG tablet Commonly known as:  ZOFRAN Take 1 tablet (8 mg total) by mouth every 8 (eight) hours as needed for nausea or vomiting.   pioglitazone 45 MG tablet Commonly known as:  ACTOS Take 45 mg by mouth daily.   prochlorperazine 10 MG tablet Commonly known as:  COMPAZINE Take 1 tablet (10 mg total) by mouth every 6 (six) hours as needed for nausea or vomiting.   sitaGLIPtin 50 MG tablet Commonly known as:  JANUVIA Take 50 mg by mouth daily.   trametinib dimethyl sulfoxide 0.5 MG tablet Commonly known as:  MEKINIST Take 3 tablets (1.5 mg total) by mouth daily. Take 1 hour before or 2 hours after a meal. Store  refrigerated in original container.   Vitamin D3 5000 units Caps Take 5,000 Units by mouth daily.      Follow-up Information    [name], MD. Schedule an appointment as soon as possible for a visit in 1 week(s).   Specialty:  Internal Medicine Why:  Hospital follow-up Contact information: [address] [place] [address] [phone]        Herminio Commons, MD. Schedule an appointment as soon as possible for a visit in 2 week(s).   Specialty:  Cardiology Why:  Hospital follow-up Contact information: [place] [address] [phone]        Cassandria Anger, MD. Schedule an appointment as soon as possible for a visit in 2 week(s).   Specialty:  Endocrinology Why:  [facility]  DIABETES MANAGEMENT Contact information: [place] [address] [phone]          No Known Allergies Allergies as of 12/28/2017   No Known Allergies     Medication List    TAKE these medications   amLODipine 10 MG tablet Commonly known as:  NORVASC Take 1 tablet (10 mg total) by mouth daily.   aspirin EC 81 MG tablet Take 81 mg by mouth daily.   dabrafenib mesylate 50 MG capsule Commonly known as:  TAFLINAR Take 2 capsules (100 mg total) by mouth 2 (two) times daily. Take on an empty stomach 1 hour before or 2 hours after meals.   glimepiride 2 MG tablet Commonly known as:  AMARYL Take 2 mg by mouth daily with breakfast.   isosorbide mononitrate 30 MG 24 hr tablet Commonly known as:  IMDUR Take 1 tablet (30 mg total) by mouth daily.   metFORMIN 500 MG tablet Commonly known as:  GLUCOPHAGE Take 1 tablet (500 mg total) by mouth 2 (two) times daily with a meal.   metoprolol tartrate 100 MG tablet Commonly known as:  LOPRESSOR Take 100 mg by mouth 2 (two) times daily.   nitroGLYCERIN 0.4 MG SL tablet Commonly known as:  NITROSTAT Place 0.4 mg under the tongue every 5 (five) minutes as needed for chest pain.    ondansetron 8 MG tablet Commonly known as:  ZOFRAN Take 1 tablet (8 mg total) by mouth every 8 (eight) hours as needed for nausea or vomiting.   pioglitazone 45 MG tablet Commonly known as:  ACTOS Take 45 mg by mouth daily.   prochlorperazine 10 MG tablet Commonly known as:  COMPAZINE Take 1 tablet (10 mg total) by mouth every 6 (six) hours as needed for nausea or vomiting.   sitaGLIPtin 50 MG tablet Commonly known as:  JANUVIA Take 50 mg by mouth daily.   trametinib dimethyl sulfoxide 0.5 MG tablet Commonly known as:  MEKINIST Take 3 tablets (1.5 mg total) by mouth daily. Take 1 hour before or 2 hours after a meal. Store refrigerated in original container.   Vitamin D3 5000 units Caps Take 5,000 Units by mouth daily.       Procedures/Studies: Dg Chest 2 View  Result Date: 12/27/2017 CLINICAL DATA:  Chest pain and cough since 10 p.m. last night. History of diabetes, lung cancer, heart disease. Former smoker. EXAM: CHEST - 2 VIEW COMPARISON:  08/18/2017 FINDINGS: Postoperative changes in the mediastinum. Heart size and pulmonary vascularity are normal for technique. Shallow inspiration. Lungs are clear. No blunting of costophrenic angles. No pneumothorax. Mediastinal contours appear intact. Calcification of the aorta. IMPRESSION: Shallow inspiration. No evidence of active pulmonary disease. Aortic atherosclerosis. Electronically Signed   By: Lucienne Capers M.D.   On: 12/27/2017 03:02   Nm Pet Image Restag (ps) Skull Base To Thigh  Result Date: 12/20/2017 CLINICAL DATA:  Subsequent treatment strategy for stage IV lung adenocarcinoma. Undergoing chemotherapy. EXAM: NUCLEAR MEDICINE PET SKULL BASE TO THIGH TECHNIQUE: 12.2 mCi F-18 FDG was injected intravenously. Full-ring PET imaging was performed from the skull base to thigh after the radiotracer. CT data was obtained and used for attenuation correction and anatomic localization. Fasting blood glucose: 181 mg/dl COMPARISON:   08/01/2017 FINDINGS: (Reference/background mediastinal blood pool activity: SUV max = 3.4) NECK:  No hypermetabolic lymph nodes or masses. Incidental CT findings:  None. CHEST: No hypermetabolic lymphadenopathy. Numerous sub-cm pulmonary nodules have decreased in both number and size since previous study. The vast  majority of these nodules show no FDG uptake. The largest nodule in the medial left upper lobe currently measures 7 x 11 mm on image 97/3, compared to 10 x 13 mm on previous study. SUV max for this nodule is currently 1.9, compared to 4.0 previously. Incidental CT findings:  None. ABDOMEN/PELVIS: No abnormal hypermetabolic activity within the liver, pancreas, adrenal glands, or spleen. No hypermetabolic lymph nodes in the abdomen or pelvis. Incidental CT findings:  None. SKELETON: No focal hypermetabolic bone lesions to suggest skeletal metastasis. Incidental CT findings:  None. IMPRESSION: Numerous tiny bilateral pulmonary nodules are decreased in size and FDG uptake since previous study. No new or progressive metastatic disease. Electronically Signed   By: Earle Gell M.D.   On: 12/20/2017 07:55      Subjective: Patient without complaints, no chest pain or shortness of breath.  Discharge Exam: Vitals:   12/28/17 0003 12/28/17 0402  BP: (!) 160/75 (!) 146/83  Pulse: 62 64  Resp:    Temp: 98.5 F (36.9 C) 97.8 F (36.6 C)  SpO2: 94% 97%   Vitals:   12/27/17 1503 12/27/17 2001 12/28/17 0003 12/28/17 0402  BP: (!) 165/69 (!) 155/75 (!) 160/75 (!) 146/83  Pulse: 67 70 62 64  Resp: 20     Temp: 98.7 F (37.1 C) 98.8 F (37.1 C) 98.5 F (36.9 C) 97.8 F (36.6 C)  TempSrc: Oral Oral Oral Oral  SpO2: 98% 97% 94% 97%  Weight:      Height:       General: Pt is alert, awake, not in acute distress Cardiovascular: normal S1/S2 +, no rubs, no gallops Respiratory: CTA bilaterally, no wheezing, no rhonchi Abdominal: Soft, NT, ND, bowel sounds + Extremities: no edema, no cyanosis    The results of significant diagnostics from this hospitalization (including imaging, microbiology, ancillary and laboratory) are listed below for reference.     Microbiology: No results found for this or any previous visit (from the past 240 hour(s)).   Labs: BNP (last 3 results) No results for input(s): BNP in the last 8760 hours. Basic Metabolic Panel: Recent Labs  Lab 12/27/17 0224  NA 133*  K 4.5  CL 98  CO2 29  GLUCOSE 196*  BUN 18  CREATININE 1.59*  CALCIUM 8.8*   Liver Function Tests: No results for input(s): AST, ALT, ALKPHOS, BILITOT, PROT, ALBUMIN in the last 168 hours. No results for input(s): LIPASE, AMYLASE in the last 168 hours. No results for input(s): AMMONIA in the last 168 hours. CBC: Recent Labs  Lab 12/27/17 0224  WBC 3.9*  HGB 12.8*  HCT 36.5*  MCV 81.3  PLT 115*   Cardiac Enzymes: Recent Labs  Lab 12/27/17 0521 12/27/17 1131 12/27/17 1451 12/27/17 1650  TROPONINI <0.03 <0.03 <0.03 <0.03   BNP: Invalid input(s): POCBNP CBG: Recent Labs  Lab 12/27/17 1155 12/27/17 1502 12/27/17 1628 12/27/17 2134 12/28/17 0748  GLUCAP 251* 295* 319* 314* 294*   D-Dimer No results for input(s): DDIMER in the last 72 hours. Hgb A1c No results for input(s): HGBA1C in the last 72 hours. Lipid Profile No results for input(s): CHOL, HDL, LDLCALC, TRIG, CHOLHDL, LDLDIRECT in the last 72 hours. Thyroid function studies No results for input(s): TSH, T4TOTAL, T3FREE, THYROIDAB in the last 72 hours.  Invalid input(s): FREET3 Anemia work up No results for input(s): VITAMINB12, FOLATE, FERRITIN, TIBC, IRON, RETICCTPCT in the last 72 hours. Urinalysis    Component Value Date/Time   COLORURINE YELLOW 05/05/2007 [place]  05/05/2007 1658   LABSPEC 1.015 05/05/2007 1658   PHURINE 5.5 05/05/2007 1658   GLUCOSEU 100 (A) 05/05/2007 1658   HGBUR NEGATIVE 05/05/2007 [place] 05/05/2007 1658   KETONESUR NEGATIVE  05/05/2007 [place] 05/05/2007 1658   UROBILINOGEN 0.2 05/05/2007 1658   NITRITE NEGATIVE 05/05/2007 1658   LEUKOCYTESUR  05/05/2007 1658    NEGATIVE MICROSCOPIC NOT DONE ON URINES WITH NEGATIVE PROTEIN, BLOOD, LEUKOCYTES, NITRITE, OR GLUCOSE <1000 mg/dL.   Sepsis Labs Invalid input(s): PROCALCITONIN,  WBC,  LACTICIDVEN Microbiology No results found for this or any previous visit (from the past 240 hour(s)).  Time coordinating discharge:   SIGNED:  Irwin Brakeman, MD  [facility] 12/28/2017, 11:04 AM Pager [phone]  If 7PM-7AM, please contact night-coverage [email or web address] Password [email or web address]

## 2017-12-28 NOTE — Progress Notes (Signed)
Patient states understanding of discharge instructions, handout given

## 2017-12-28 NOTE — Discharge Instructions (Signed)
Follow with Primary MD  [name], MD  and other consultants as instructed your Hospitalist MD  Please get a complete blood count and chemistry panel checked by your Primary MD at your next visit, and again as instructed by your Primary MD.  Get Medicines reviewed and adjusted: Please take all your medications with you for your next visit with your Primary MD  Laboratory/radiological data: Please request your Primary MD to go over all hospital tests and procedure/radiological results at the follow up, please ask your Primary MD to get all Hospital records sent to his/her office.  In some cases, they will be blood work, cultures and biopsy results pending at the time of your discharge. Please request that your primary care M.D. follows up on these results.  Also Note the following: If you experience worsening of your admission symptoms, develop shortness of breath, life threatening emergency, suicidal or homicidal thoughts you must seek medical attention immediately by calling 911 or calling your MD immediately  if symptoms less severe.  You must read complete instructions/literature along with all the possible adverse reactions/side effects for all the Medicines you take and that have been prescribed to you. Take any new Medicines after you have completely understood and accpet all the possible adverse reactions/side effects.   Do not drive when taking Pain medications or sleeping medications (Benzodaizepines)  Do not take more than prescribed Pain, Sleep and Anxiety Medications. It is not advisable to combine anxiety,sleep and pain medications without talking with your primary care practitioner  Special Instructions: If you have smoked or chewed Tobacco  in the last 2 yrs please stop smoking, stop any regular Alcohol  and or any Recreational drug use.  Wear Seat belts while driving.  Please note: You were cared for by a hospitalist during your hospital stay. Once you are discharged, your  primary care physician will handle any further medical issues. Please note that NO REFILLS for any discharge medications will be authorized once you are discharged, as it is imperative that you return to your primary care physician (or establish a relationship with a primary care physician if you do not have one) for your post hospital discharge needs so that they can reassess your need for medications and monitor your lab values.   Hypoglycemia Hypoglycemia is when the sugar (glucose) level in the blood is too low. Symptoms of low blood sugar may include:  Feeling: ? Hungry. ? Worried or nervous (anxious). ? Sweaty and clammy. ? Confused. ? Dizzy. ? Sleepy. ? Sick to your stomach (nauseous).  Having: ? A fast heartbeat. ? A headache. ? A change in your vision. ? Jerky movements that you cannot control (seizure). ? Nightmares. ? Tingling or no feeling (numbness) around the mouth, lips, or tongue.  Having trouble with: ? Talking. ? Paying attention (concentrating). ? Moving (coordination). ? Sleeping.  Shaking.  Passing out (fainting).  Getting upset easily (irritability).  Low blood sugar can happen to people who have diabetes and people who do not have diabetes. Low blood sugar can happen quickly, and it can be an emergency. Treating Low Blood Sugar Low blood sugar is often treated by eating or drinking something sugary right away. If you can think clearly and swallow safely, follow the 15:15 rule:  Take 15 grams of a fast-acting carb (carbohydrate). Some fast-acting carbs are: ? 1 tube of glucose gel. ? 3 sugar tablets (glucose pills). ? 6-8 pieces of hard candy. ? 4 oz (120 mL) of fruit juice. ?  4 oz (120 mL) of regular (not diet) soda.  Check your blood sugar 15 minutes after you take the carb.  If your blood sugar is still at or below 70 mg/dL (3.9 mmol/L), take 15 grams of a carb again.  If your blood sugar does not go above 70 mg/dL (3.9 mmol/L) after 3 tries,  get help right away.  After your blood sugar goes back to normal, eat a meal or a snack within 1 hour.  Treating Very Low Blood Sugar If your blood sugar is at or below 54 mg/dL (3 mmol/L), you have very low blood sugar (severe hypoglycemia). This is an emergency. Do not wait to see if the symptoms will go away. Get medical help right away. Call your local emergency services (911 in the [place] Do not drive yourself to the hospital. If you have very low blood sugar and you cannot eat or drink, you may need a glucagon shot (injection). A family member or friend should learn how to check your blood sugar and how to give you a glucagon shot. Ask your doctor if you need to have a glucagon shot kit at home. Follow these instructions at home: General instructions  Avoid any diets that cause you to not eat enough food. Talk with your doctor before you start any new diet.  Take over-the-counter and prescription medicines only as told by your doctor.  Limit alcohol to no more than 1 drink per day for nonpregnant women and 2 drinks per day for men. One drink equals 12 oz of beer, 5 oz of wine, or 1 oz of hard liquor.  Keep all follow-up visits as told by your doctor. This is important. If You Have Diabetes:   Make sure you know the symptoms of low blood sugar.  Always keep a source of sugar with you, such as: ? Sugar. ? Sugar tablets. ? Glucose gel. ? Fruit juice. ? Regular soda (not diet soda). ? Milk. ? Hard candy. ? Honey.  Take your medicines as told.  Follow your exercise and meal plan. ? Eat on time. Do not skip meals. ? Follow your sick day plan when you cannot eat or drink normally. Make this plan ahead of time with your doctor.  Check your blood sugar as often as told by your doctor. Always check before and after exercise.  Share your diabetes care plan with: ? Your work or school. ? People you live with.  Check your pee (urine) for ketones: ? When you are sick. ? As told  by your doctor.  Carry a card or wear jewelry that says you have diabetes. If You Have Low Blood Sugar From Other Causes:   Check your blood sugar as often as told by your doctor.  Follow instructions from your doctor about what you cannot eat or drink. Contact a doctor if:  You have trouble keeping your blood sugar in your target range.  You have low blood sugar often. Get help right away if:  You still have symptoms after you eat or drink something sugary.  Your blood sugar is at or below 54 mg/dL (3 mmol/L).  You have jerky movements that you cannot control.  You pass out. These symptoms may be an emergency. Do not wait to see if the symptoms will go away. Get medical help right away. Call your local emergency services (911 in the [place] Do not drive yourself to the hospital. This information is not intended to replace advice given to you by  your health care provider. Make sure you discuss any questions you have with your health care provider. Document Released: 07/28/2009 Document Revised: 10/09/2015 Document Reviewed: 06/06/2015 Elsevier Interactive Patient Education  2018 [place].  Insulin Treatment for Diabetes Diabetes (diabetes mellitus) is a long-term (chronic) disease. It occurs when the body does not properly use sugar (glucose) that is released from food after digestion. Glucose levels are controlled by a hormone called insulin, which is made in the pancreas.  If you have type 1 diabetes, the pancreas does not make any insulin, so you must take insulin.  If you have type 2 diabetes, you might need to take insulin along with other medicines. In type 2 diabetes, one or both of these problems may be present: ? The pancreas does not make enough insulin. ? Cells in the body do not respond properly to insulin that the body makes (insulin resistance).  You must use insulin correctly to control your diabetes. You must have some insulin in your body at all times.  Insulin treatment varies depending on your type of diabetes, your treatment goals, and your medical history. It is important for you to understand your insulin treatment plan so you can be an active partner in managing your diabetes. How is insulin given? Insulin can only be given through a shot (injection). It is injected using a syringe and needle, an insulin pen, a pump, or a jet injector. Your health care provider will:  Prescribe the amount and type of insulin that you need.  Tell you when you should inject your insulin.  Where on the body should insulin be injected? Insulin is injected into a layer of fatty tissue under the skin. Good places to inject insulin include:  Abdomen. Generally, the abdomen is the best place to inject insulin. However, you should avoid any area that is less than 2 inches (5 cm) from the belly button (navel).  Front and outer area of the upper thighs.  The back of the upper arms.  Upper buttocks.  It is important to:  Give your injection in a slightly different place each time. This helps to prevent irritation and improve absorption.  Avoid injecting into areas that have scar tissue.  Usually, you will give yourself insulin injections. Others can also be taught how to give you injections. You will use a special type of syringe that is made only for insulin. Some people may have an insulin pump that delivers insulin steadily through a tube (cannula) that is placed under the skin. What are the different types of insulin? The following information is a general guide to different types of insulin. Specifics vary depending on the insulin product that your health care provider prescribes.  Rapid-acting insulin: ? Starts working quickly, in as little as 5 minutes. ? Can last for 4-6 hours, or sometimes longer. ? Works well when taken right before a meal to quickly lower blood glucose.  Short-acting insulin: ? Starts working in about 30 minutes. ? Can last  for 6-10 hours. ? Should be taken about 30 minutes before you start eating a meal.  Intermediate-acting insulin: ? Starts working in 1-2 hours. ? Lasts for about 10-18 hours. ? Lowers your blood glucose for a longer period of time but is not as effective for lowering blood glucose right after a meal.  Long-acting insulin: ? Mimics the small amount of insulin that your pancreas usually produces throughout the day. ? Should be used either one or two times a day. ? Is  usually used in combination with other types of insulin or other medicines.  Concentrated insulin, or U-500 insulin: ? Contains a higher dose of insulin than most rapid-acting insulins. U-500 insulin has 5 times the amount of insulin per 1 mL. ? Should only be used with the special U-500 syringe or U-500 insulin pen. It is dangerous to use the wrong type of syringe with this insulin.  What are the side effects of insulin? Possible side effects of insulin treatment include:  Low blood glucose (hypoglycemia).  Weight gain.  High blood glucose (hyperglycemia).  Skin injury or irritation.  Some of these side effects can be caused by using improper injection technique. It is important to learn to inject insulin properly. What are common terms associated with insulin treatment? Some terms that you might hear include:  Basal insulin, or basal rate. This is the constant amount of insulin that needs to be present in your body to stabilize your blood glucose levels. People who have type 1 diabetes need basal insulin in a steady (continuous) dose 24 hours a day. ? Usually, intermediate-acting or long-acting insulin is used one or two times a day to manage basal insulin levels. ? Medicines that are taken by mouth may also be recommended to manage basal insulin levels.  Prandial insulin. This refers to meal-related insulin. ? Blood glucose rises quickly after a meal (postprandial). Rapid-acting or short-acting insulin can be used  right before a meal (preprandial) to quickly lower blood glucose. ? You may be instructed to adjust the amount of prandial insulin that you take depending on how much carbohydrate (starch) is in your meal.  Corrective insulin. This may also be called a correction dose or supplemental dose. This is a small amount of rapid-acting or short-acting insulin that can be used to lower blood glucose if it is too high. You may be instructed to check your blood glucose at certain times of the day and use corrective insulin as needed.  Tight control, or intensive therapy. This means keeping your blood glucose as close to your target as possible, and preventing it from getting too high after meals. People who have tight control of their diabetes have fewer long-term problems caused by diabetes.  General instructions  Talk with your health care provider or pharmacist about the type of insulin you should take and when you should take it. You should know when your insulin peaks and when it wears off. You need this information so you can plan your meals and exercise. You also need to work with your health care provider to:  Check your blood glucose every day. Your health care provider will tell you how often and when you should do this.  Manage your: ? Weight. ? Blood pressure. ? Cholesterol. ? Stress.  Eat a healthy diet.  Exercise regularly.  This information is not intended to replace advice given to you by your health care provider. Make sure you discuss any questions you have with your health care provider. Document Released: 07/30/2008 Document Revised: 10/09/2015 Document Reviewed: 06/06/2015 Elsevier Interactive Patient Education  2018 [employer].  How to Avoid Diabetes Mellitus Problems You can take action to prevent or slow down problems that are caused by diabetes (diabetes mellitus). Following your diabetes plan and taking care of yourself can reduce your risk of serious or life-threatening  complications. Manage your diabetes  Follow instructions from your health care providers about managing your diabetes. Your diabetes may be managed by a team of health care providers  who can teach you how to care for yourself and can answer questions that you have.  Educate yourself about your condition so you can make healthy choices about eating and physical activity.  Check your blood sugar (glucose) levels as often as directed. Your health care provider will help you decide how often to check your blood glucose level depending on your treatment goals and how well you are meeting them.  Ask your health care provider if you should take low-dose aspirin daily and what dose is recommended for you. Taking low-dose aspirin daily is recommended to help prevent cardiovascular disease. Do not use nicotine or tobacco Do not use any products that contain nicotine or tobacco, such as cigarettes and e-cigarettes. If you need help quitting, ask your health care provider. Nicotine raises your risk for diabetes problems. If you quit using nicotine:  You will lower your risk for heart attack, stroke, nerve disease, and kidney disease.  Your cholesterol and blood pressure may improve.  Your blood circulation will improve.  Keep your blood pressure under control To control your blood pressure:  Follow instructions from your health care provider about meal planning, exercise, and medicines.  Make sure your health care provider checks your blood pressure at every medical visit.  A blood pressure reading consists of two numbers. Generally, the goal is to keep your top number (systolic pressure) at or below 130, and your bottom number (diastolic pressure) at or below 80. Your health care provider may recommend a lower target blood pressure. Your individualized target blood pressure is determined based on:  Your age.  Your medicines.  How long you have had diabetes.  Any other medical conditions you  have.  Keep your cholesterol under control To control your cholesterol:  Follow instructions from your health care provider about meal planning, exercise, and medicines.  Have your cholesterol checked at least once a year.  You may be prescribed medicine to lower cholesterol (statin). If you are not taking a statin, ask your health care provider if you should be.  Controlling your cholesterol may:  Help prevent heart disease and stroke. These are the most common health problems for people with diabetes.  Improve your blood flow.  Schedule and keep yearly physical exams and eye exams Your health care provider will tell you how often you need medical visits depending on your diabetes management plan. Keep all follow-up visits as directed. This is important so possible problems can be identified early and complications can be avoided or treated.  Every visit with your health care provider should include measuring your: ? Weight. ? Blood pressure. ? Blood glucose control.  Your A1c (hemoglobin A1c) level should be checked: ? At least 2 times a year, if you are meeting your treatment goals. ? 4 times a year, if you are not meeting treatment goals or if your treatment goals have changed.  Your blood lipids (lipid profile) should be checked yearly. You should also be checked yearly for protein in your urine (urine microalbumin).  If you have type 1 diabetes, get an eye exam 3-5 years after you are diagnosed, and then once a year after your first exam.  If you have type 2 diabetes, get an eye exam as soon as you are diagnosed, and then once a year after your first exam.  Keep your vaccines current It is recommended that you receive:  A flu (influenza) vaccine every year.  A pneumonia (pneumococcal) vaccine and a hepatitis B vaccine. If you are  age 74 or older, you may get the pneumonia vaccine as a series of two separate shots.  Ask your health care provider which other vaccines may  be recommended. Take care of your feet Diabetes may cause you to have poor blood circulation to your legs and feet. Because of this, taking care of your feet is very important. Diabetes can cause:  The skin on the feet to get thinner, break more easily, and heal more slowly.  Nerve damage in your legs and feet, which results in decreased feeling. You may not notice minor injuries that could lead to serious problems.  To avoid foot problems:  Check your skin and feet every day for cuts, bruises, redness, blisters, or sores.  Schedule a foot exam with your health care provider once every year. This exam includes: ? Inspecting of the structure and skin of your feet. ? Checking the pulses and sensation in your feet.  Make sure that your health care provider performs a visual foot exam at every medical visit.  Take care of your teeth People with poorly controlled diabetes are more likely to have gum (periodontal) disease. Diabetes can make periodontal diseases harder to control. If not treated, periodontal diseases can lead to tooth loss. To prevent this:  Brush your teeth twice a day.  Floss at least once a day.  Visit your dentist 2 times a year.  Drink responsibly Limit alcohol intake to no more than 1 drink a day for nonpregnant women and 2 drinks a day for men. One drink equals 12 oz of beer, 5 oz of wine, or 1 oz of hard liquor. It is important to eat food when you drink alcohol to avoid low blood glucose (hypoglycemia). Avoid alcohol if you:  Have a history of alcohol abuse or dependence.  Are pregnant.  Have liver disease, pancreatitis, advanced neuropathy, or severe hypertriglyceridemia.  Lessen stress Living with diabetes can be stressful. When you are experiencing stress, your blood glucose may be affected in two ways:  Stress hormones may cause your blood glucose to rise.  You may be distracted from taking good care of yourself.  Be aware of your stress level and  make changes to help you manage challenging situations. To lower your stress levels:  Consider joining a support group.  Do planned relaxation or meditation.  Do a hobby that you enjoy.  Maintain healthy relationships.  Exercise regularly.  Work with your health care provider or a mental health professional.  Summary  You can take action to prevent or slow down problems that are caused by diabetes (diabetes mellitus). Following your diabetes plan and taking care of yourself can reduce your risk of serious or life-threatening complications.  Follow instructions from your health care providers about managing your diabetes. Your diabetes may be managed by a team of health care providers who can teach you how to care for yourself and can answer questions that you have.  Your health care provider will tell you how often you need medical visits depending on your diabetes management plan. Keep all follow-up visits as directed. This is important so possible problems can be identified early and complications can be avoided or treated. This information is not intended to replace advice given to you by your health care provider. Make sure you discuss any questions you have with your health care provider. Document Released: 01/19/2011 Document Revised: 01/31/2016 Document Reviewed: 01/31/2016 Elsevier Interactive Patient Education  2018 [place].  Blood Glucose Monitoring, Adult Monitoring your blood  sugar (glucose) helps you manage your diabetes. It also helps you and your health care provider determine how well your diabetes management plan is working. Blood glucose monitoring involves checking your blood glucose as often as directed, and keeping a record (log) of your results over time. Why should I monitor my blood glucose? Checking your blood glucose regularly can:  Help you understand how food, exercise, illnesses, and medicines affect your blood glucose.  Let you know what your blood  glucose is at any time. You can quickly tell if you are having low blood glucose (hypoglycemia) or high blood glucose (hyperglycemia).  Help you and your health care provider adjust your medicines as needed.  When should I check my blood glucose? Follow instructions from your health care provider about how often to check your blood glucose. This may depend on:  The type of diabetes you have.  How well-controlled your diabetes is.  Medicines you are taking.  If you have type 1 diabetes:  Check your blood glucose at least 2 times a day.  Also check your blood glucose: ? Before every insulin injection. ? Before and after exercise. ? Between meals. ? 2 hours after a meal. ? Occasionally between 2:00 a.m. and 3:00 a.m., as directed. ? Before potentially dangerous tasks, like driving or using heavy machinery. ? At bedtime.  You may need to check your blood glucose more often, up to 6-10 times a day: ? If you use an insulin pump. ? If you need multiple daily injections (MDI). ? If your diabetes is not well-controlled. ? If you are ill. ? If you have a history of severe hypoglycemia. ? If you have a history of not knowing when your blood glucose is getting low (hypoglycemia unawareness). If you have type 2 diabetes:  If you take insulin or other diabetes medicines, check your blood glucose at least 2 times a day.  If you are on intensive insulin therapy, check your blood glucose at least 4 times a day. Occasionally, you may also need to check between 2:00 a.m. and 3:00 a.m., as directed.  Also check your blood glucose: ? Before and after exercise. ? Before potentially dangerous tasks, like driving or using heavy machinery.  You may need to check your blood glucose more often if: ? Your medicine is being adjusted. ? Your diabetes is not well-controlled. ? You are ill. What is a blood glucose log?  A blood glucose log is a record of your blood glucose readings. It helps you and  your health care provider: ? Look for patterns in your blood glucose over time. ? Adjust your diabetes management plan as needed.  Every time you check your blood glucose, write down your result and notes about things that may be affecting your blood glucose, such as your diet and exercise for the day.  Most glucose meters store a record of glucose readings in the meter. Some meters allow you to download your records to a computer. How do I check my blood glucose? Follow these steps to get accurate readings of your blood glucose: Supplies needed   Blood glucose meter.  Test strips for your meter. Each meter has its own strips. You must use the strips that come with your meter.  A needle to prick your finger (lancet). Do not use lancets more than once.  A device that holds the lancet (lancing device).  A journal or log book to write down your results. Procedure  Wash your hands with soap and  water.  Prick the side of your finger (not the tip) with the lancet. Use a different finger each time.  Gently rub the finger until a small drop of blood appears.  Follow instructions that come with your meter for inserting the test strip, applying blood to the strip, and using your blood glucose meter.  Write down your result and any notes. Alternative testing sites  Some meters allow you to use areas of your body other than your finger (alternative sites) to test your blood.  If you think you may have hypoglycemia, or if you have hypoglycemia unawareness, do not use alternative sites. Use your finger instead.  Alternative sites may not be as accurate as the fingers, because blood flow is slower in these areas. This means that the result you get may be delayed, and it may be different from the result that you would get from your finger.  The most common alternative sites are: ? Forearm. ? Thigh. ? Palm of the hand. Additional tips  Always keep your supplies with you.  If you have  questions or need help, all blood glucose meters have a 24-hour hotline number that you can call. You may also contact your health care provider.  After you use a few boxes of test strips, adjust (calibrate) your blood glucose meter by following instructions that came with your meter. This information is not intended to replace advice given to you by your health care provider. Make sure you discuss any questions you have with your health care provider. Document Released: 05/06/2003 Document Revised: 11/21/2015 Document Reviewed: 10/13/2015 Elsevier Interactive Patient Education  2017 [employer].

## 2018-01-04 DIAGNOSIS — R079 Chest pain, unspecified: Secondary | ICD-10-CM | POA: Diagnosis not present

## 2018-01-04 DIAGNOSIS — E119 Type 2 diabetes mellitus without complications: Secondary | ICD-10-CM | POA: Diagnosis not present

## 2018-01-11 ENCOUNTER — Ambulatory Visit: Payer: Self-pay | Admitting: "Endocrinology

## 2018-01-18 ENCOUNTER — Inpatient Hospital Stay (HOSPITAL_COMMUNITY): Payer: Medicare Other | Attending: Hematology

## 2018-01-18 DIAGNOSIS — N289 Disorder of kidney and ureter, unspecified: Secondary | ICD-10-CM | POA: Insufficient documentation

## 2018-01-18 DIAGNOSIS — Z87891 Personal history of nicotine dependence: Secondary | ICD-10-CM | POA: Diagnosis not present

## 2018-01-18 DIAGNOSIS — E119 Type 2 diabetes mellitus without complications: Secondary | ICD-10-CM | POA: Diagnosis not present

## 2018-01-18 DIAGNOSIS — I10 Essential (primary) hypertension: Secondary | ICD-10-CM | POA: Diagnosis not present

## 2018-01-18 DIAGNOSIS — C3490 Malignant neoplasm of unspecified part of unspecified bronchus or lung: Secondary | ICD-10-CM | POA: Diagnosis not present

## 2018-01-18 LAB — COMPREHENSIVE METABOLIC PANEL
ALT: 17 U/L (ref 0–44)
AST: 28 U/L (ref 15–41)
Albumin: 3.3 g/dL — ABNORMAL LOW (ref 3.5–5.0)
Alkaline Phosphatase: 98 U/L (ref 38–126)
Anion gap: 7 (ref 5–15)
BUN: 14 mg/dL (ref 8–23)
CHLORIDE: 98 mmol/L (ref 98–111)
CO2: 28 mmol/L (ref 22–32)
Calcium: 8.8 mg/dL — ABNORMAL LOW (ref 8.9–10.3)
Creatinine, Ser: 1.58 mg/dL — ABNORMAL HIGH (ref 0.61–1.24)
GFR calc Af Amer: 47 mL/min — ABNORMAL LOW (ref >60)
GFR, EST NON AFRICAN AMERICAN: 40 mL/min — AB (ref >60)
Glucose, Bld: 172 mg/dL — ABNORMAL HIGH (ref 70–99)
Potassium: 3.9 mmol/L (ref 3.5–5.1)
Sodium: 133 mmol/L — ABNORMAL LOW (ref 135–145)
Total Bilirubin: 0.6 mg/dL (ref 0.3–1.2)
Total Protein: 6.7 g/dL (ref 6.5–8.1)

## 2018-01-18 LAB — CBC WITH DIFFERENTIAL/PLATELET
BASOS ABS: 0 10*3/uL (ref 0.0–0.1)
Basophils Relative: 0 %
EOS PCT: 1 %
Eosinophils Absolute: 0 10*3/uL (ref 0.0–0.7)
HCT: 36.2 % — ABNORMAL LOW (ref 39.0–52.0)
Hemoglobin: 12.6 g/dL — ABNORMAL LOW (ref 13.0–17.0)
LYMPHS PCT: 28 %
Lymphs Abs: 1.3 10*3/uL (ref 0.7–4.0)
MCH: 28.4 pg (ref 26.0–34.0)
MCHC: 34.8 g/dL (ref 30.0–36.0)
MCV: 81.5 fL (ref 78.0–100.0)
MONO ABS: 0.5 10*3/uL (ref 0.1–1.0)
Monocytes Relative: 12 %
Neutro Abs: 2.6 10*3/uL (ref 1.7–7.7)
Neutrophils Relative %: 59 %
PLATELETS: 127 10*3/uL — AB (ref 150–400)
RBC: 4.44 MIL/uL (ref 4.22–5.81)
RDW: 14.5 % (ref 11.5–15.5)
WBC: 4.5 10*3/uL (ref 4.0–10.5)

## 2018-01-18 LAB — MAGNESIUM: MAGNESIUM: 1.7 mg/dL (ref 1.7–2.4)

## 2018-01-23 ENCOUNTER — Encounter (HOSPITAL_COMMUNITY): Payer: Self-pay | Admitting: Internal Medicine

## 2018-01-23 ENCOUNTER — Inpatient Hospital Stay (HOSPITAL_BASED_OUTPATIENT_CLINIC_OR_DEPARTMENT_OTHER): Payer: Medicare Other | Admitting: Internal Medicine

## 2018-01-23 VITALS — BP 148/56 | HR 50 | Temp 97.9°F | Resp 18 | Wt 189.3 lb

## 2018-01-23 DIAGNOSIS — N289 Disorder of kidney and ureter, unspecified: Secondary | ICD-10-CM | POA: Diagnosis not present

## 2018-01-23 DIAGNOSIS — C3490 Malignant neoplasm of unspecified part of unspecified bronchus or lung: Secondary | ICD-10-CM | POA: Diagnosis not present

## 2018-01-23 DIAGNOSIS — Z87891 Personal history of nicotine dependence: Secondary | ICD-10-CM

## 2018-01-23 DIAGNOSIS — I10 Essential (primary) hypertension: Secondary | ICD-10-CM

## 2018-01-23 DIAGNOSIS — E119 Type 2 diabetes mellitus without complications: Secondary | ICD-10-CM

## 2018-01-23 NOTE — Patient Instructions (Signed)
[place] at [facility]  Discharge Instructions:  You were seen by Dr. Walden Field. _______________________________________________________________  Thank you for choosing [place] at [facility] to provide your oncology and hematology care.  To afford each patient quality time with our providers, please arrive at least 15 minutes before your scheduled appointment.  You need to re-schedule your appointment if you arrive 10 or more minutes late.  We strive to give you quality time with our providers, and arriving late affects you and other patients whose appointments are after yours.  Also, if you no show three or more times for appointments you may be dismissed from the clinic.  Again, thank you for choosing [place] [facility] hope is that these requests will allow you access to exceptional care and in a timely manner. _______________________________________________________________  If you have questions after your visit, please contact our office at [phone] between the hours of 8:30 a.m. and 5:00 p.m. Voicemails left after 4:30 p.m. will not be returned until the following business day. _______________________________________________________________  For prescription refill requests, have your pharmacy contact our office. _______________________________________________________________  Recommendations made by the consultant and any test results will be sent to your referring physician. _______________________________________________________________

## 2018-01-23 NOTE — Progress Notes (Signed)
Diagnosis Adenocarcinoma of lung, stage 4, unspecified laterality ([place]) - Plan: CBC with Differential/Platelet, Comprehensive metabolic panel  Staging Cancer Staging No matching staging information was found for the patient.  Assessment and Plan:  Adenocarcinoma of lung, stage 4 ([place]) 1.  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  [name] has multiple bilateral lung nodules making him metastatic.   -Dabrafenib 100 mg twice daily and trametinib 1.5 mg once daily (response rates of 65% and median PFS of 10.9 months) on an empty stomach was started on 10/07/2017.   - PET CT scan dated 12/19/2017 showed decrease in size of the lung nodules as well as SUV value.  [name] will continue dabrafenib 100 mg twice daily and trametinib 1.5 mg once daily.  Labs done 01/18/2018 reviewed and showed WBC 4.5 HB 12.6 Plts 127,000.  Chemistries WNL with K+ 3.9 normal LFTs.  Pt will follow-up with Dr. Delray Alt in 4 weeks.   2.  RI.  Cr is 1.6.  Will repeat labs in 4 weeks.    3.  Hypertension.  BP is 148/56.  Follow-up with PCP as directed.   [name] is taking metoprolol 100 mg twice daily.On 10/18/2017, we have added Norvasc 10 mg daily.  His blood pressure is very we  4.  Health maintenance.  Last colonoscopy was in 08/2017 and showed multiple polyps which were tubular adenoma  Interval History:  Historical data obtained from note dated 12/22/2017.  1.  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  [name] has multiple bilateral lung nodules making him metastatic.   -Dabrafenib 100 mg twice daily and trametinib 1.5 mg once  daily (response rates of 65% and median PFS of 10.9 months) on an empty stomach was started on 10/07/2017.   - PET CT scan dated 12/19/2017 showed decrease in size of the lung nodules as well as SUV value.   Current Status:  Pt is seen today for follow-up.  [name] is tolerating Dabrafenib and trametinib.    Problem List Patient Active Problem List   Diagnosis Date Noted  . Chest pain [R07.9] 12/27/2017  . Adenocarcinoma of lung, stage 4 ([place]) [C34.90] 09/08/2017  . Abnormal CT of the abdomen [R93.5] 09/05/2017  . CKD (chronic kidney disease) stage 3, GFR 30-59 ml/min (HCC) [N18.3] 06/20/2017  . Type 2 diabetes mellitus without complication, without long-term current use of insulin ([place]) [E11.9] 11/04/2008  . Coronary atherosclerosis [I25.10] 11/04/2008  . Angina pectoris ([place]) [I20.9] 11/04/2008  . 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 Cancer ([place])   . Chronic kidney disease   . Coronary artery disease   . Diabetes mellitus   . Hypertension   . 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 Spontaneous pneumothorax 09/15/2010   right    Past Surgical History Past Surgical History:  Procedure Laterality Date  . ANKLE FRACTURE SURGERY  2008   [facility]  . APPENDECTOMY  1960's  . CHEST TUBE INSERTION Right 09/15/2010   Dr  Gerhardt - spontaneous PTX  . CHOLECYSTECTOMY  2008  . COLONOSCOPY N/A 09/07/2017   Procedure: COLONOSCOPY;  Surgeon: Daneil Dolin, MD;  Location[facility] END[facility];  Service: Endoscopy;  Laterality: N/A;  10:30am  . CORONARY ARTERY BYPASS GRAFT  2003  . EYE SURGERY  2001   Cataracts removed bilaterally  . INCISIONAL HERNIA REPAIR N/A 01/29/2015   Procedure: Fatima Blank HERNIORRHAPHY WITH MESH;  Surgeon: Aviva Signs, MD;  Location: [facility] ORS;  Service: General;  Laterality: N/A;  . INSERTION OF MESH N/A  01/29/2015   Procedure: INSERTION OF MESH;  Surgeon: Aviva Signs, MD;  Location: [facility] ORS;  Service: General;  Laterality: N/A;  . [place]  . LEFT HEART CATH AND CORS/GRAFTS ANGIOGRAPHY N/A 06/20/2017   Procedure: LEFT HEART CATH AND CORS/GRAFTS ANGIOGRAPHY;  Surgeon: Martinique, Peter M, MD;  Location: [place] CV LAB;  Service: Cardiovascular;  Laterality: N/A;  . ORIF ANKLE FRACTURE  08/26/2011   Procedure: OPEN REDUCTION INTERNAL FIXATION (ORIF) ANKLE FRACTURE;  Surgeon: Sanjuana Kava, MD;  Location: [facility] ORS;  Service: Orthopedics;  Laterality: Right;  . POLYPECTOMY  09/07/2017   Procedure: POLYPECTOMY;  Surgeon: Daneil Dolin, MD;  Location: [facility];  Service: Endoscopy;;  ascending x2 (cold snare)  . PROSTATE SURGERY  2012   Enlarged prostate  . TRANSURETHRAL RESECTION OF PROSTATE  2012    Family History Family History  Problem Relation Age of Onset  . Aneurysm Mother   . Heart attack Father   . Colon cancer Maternal Uncle   . Cancer Brother   . Gastric cancer Neg Hx   . Esophageal cancer Neg Hx      Social History  reports that [name] quit smoking about 30 years ago. [name] has a 33.00 pack-year smoking history. [name] has never used smokeless tobacco. [name] reports that [name] does not drink alcohol or use drugs.  Medications  Current Outpatient Medications:  .  amLODipine (NORVASC) 10 MG tablet, Take 1 tablet (10 mg total) by mouth daily., Disp: 30 tablet, Rfl: 3 .  aspirin EC 81 MG tablet, Take 81 mg by mouth daily., Disp: , Rfl:  .  Cholecalciferol (VITAMIN D3) 5000 units CAPS, Take 5,000 Units by mouth daily., Disp: , Rfl:  .  dabrafenib mesylate (TAFLINAR) 50 MG capsule, Take 2 capsules (100 mg total) by mouth 2 (two) times daily. Take on an empty stomach 1 hour before or 2 hours after meals., Disp: 120 capsule, Rfl: 0 .  glimepiride (AMARYL) 2 MG tablet, Take 2 mg by mouth daily with breakfast. , Disp: , Rfl:  .  isosorbide mononitrate (IMDUR) 30 MG  24 hr tablet, Take 1 tablet (30 mg total) by mouth daily., Disp: 90 tablet, Rfl: 3 .  metFORMIN (GLUCOPHAGE) 500 MG tablet, Take 1 tablet (500 mg total) by mouth 2 (two) times daily with a meal., Disp: , Rfl:  .  metoprolol (LOPRESSOR) 100 MG tablet, Take 100 mg by mouth 2 (two) times daily., Disp: , Rfl:  .  nitroGLYCERIN (NITROSTAT) 0.4 MG SL tablet, Place 0.4 mg under the tongue every 5 (five) minutes as needed for chest pain. , Disp: , Rfl:  .  ondansetron (ZOFRAN) 8 MG tablet, Take 1 tablet (8 mg total) by mouth every 8 (eight) hours as needed for nausea or vomiting., Disp: 30 tablet, Rfl: 2 .  pioglitazone (ACTOS) 45 MG tablet, Take 45 mg by mouth daily., Disp: , Rfl:  .  prochlorperazine (COMPAZINE)  10 MG tablet, Take 1 tablet (10 mg total) by mouth every 6 (six) hours as needed for nausea or vomiting., Disp: 30 tablet, Rfl: 2 .  sitaGLIPtin (JANUVIA) 50 MG tablet, Take 50 mg by mouth daily. , Disp: , Rfl:  .  trametinib dimethyl sulfoxide (MEKINIST) 0.5 MG tablet, Take 3 tablets (1.5 mg total) by mouth daily. Take 1 hour before or 2 hours after a meal. Store refrigerated in original container., Disp: 90 tablet, Rfl: 0  Allergies Patient has no known allergies.  Review of Systems Review of Systems - Oncology ROS negative   Physical Exam  Vitals Wt Readings from Last 3 Encounters:  01/23/18 189 lb 4.8 oz (85.9 kg)  12/27/17 186 lb 15.2 oz (84.8 kg)  12/22/17 189 lb 9.6 oz (86 kg)   Temp Readings from Last 3 Encounters:  01/23/18 97.9 F (36.6 C) (Oral)  12/28/17 97.8 F (36.6 C) (Oral)  11/23/17 97.8 F (36.6 C) (Oral)   BP Readings from Last 3 Encounters:  01/23/18 (!) 148/56  12/28/17 (!) 146/83  12/22/17 131/71   Pulse Readings from Last 3 Encounters:  01/23/18 (!) 50  12/28/17 64  12/22/17 62    Constitutional: Well-developed, well-nourished, and in no distress.   HENT: Head: Normocephalic and atraumatic.  Mouth/Throat: No oropharyngeal exudate. Mucosa  moist. Eyes: Pupils are equal, round, and reactive to light. Conjunctivae are normal. No scleral icterus.  Neck: Normal range of motion. Neck supple. No JVD present.  Cardiovascular: Normal rate, regular rhythm and normal heart sounds.  Exam reveals no gallop and no friction rub.   No murmur heard. Pulmonary/Chest: Effort normal and breath sounds normal. No respiratory distress. No wheezes.No rales.  Abdominal: Soft. Bowel sounds are normal. No distension. There is no tenderness. There is no guarding.  Musculoskeletal: No edema or tenderness.  Lymphadenopathy: No cervical, axillary or supraclavicular adenopathy.  Neurological: Alert and oriented to person, place, and time. No cranial nerve deficit.  Skin: Skin is warm and dry. No rash noted. No erythema. No pallor.  Psychiatric: Affect and judgment normal.   Labs No visits with results within 3 Day(s) from this visit.  Latest known visit with results is:  Appointment on 01/18/2018  Component Date Value Ref Range Status  . WBC 01/18/2018 4.5  4.0 - 10.5 K/uL Final  . RBC 01/18/2018 4.44  4.22 - 5.81 MIL/uL Final  . Hemoglobin 01/18/2018 12.6* 13.0 - 17.0 g/dL Final  . HCT 01/18/2018 36.2* 39.0 - 52.0 % Final  . MCV 01/18/2018 81.5  78.0 - 100.0 fL Final  . MCH 01/18/2018 28.4  26.0 - 34.0 pg Final  . MCHC 01/18/2018 34.8  30.0 - 36.0 g/dL Final  . RDW 01/18/2018 14.5  11.5 - 15.5 % Final  . Platelets 01/18/2018 127* 150 - 400 K/uL Final  . Neutrophils Relative % 01/18/2018 59  % Final  . Neutro Abs 01/18/2018 2.6  1.7 - 7.7 K/uL Final  . Lymphocytes Relative 01/18/2018 28  % Final  . Lymphs Abs 01/18/2018 1.3  0.7 - 4.0 K/uL Final  . Monocytes Relative 01/18/2018 12  % Final  . Monocytes Absolute 01/18/2018 0.5  0.1 - 1.0 K/uL Final  . Eosinophils Relative 01/18/2018 1  % Final  . Eosinophils Absolute 01/18/2018 0.0  0.0 - 0.7 K/uL Final  . Basophils Relative 01/18/2018 0  % Final  . Basophils Absolute 01/18/2018 0.0  0.0 - 0.1  K/uL Final   Performed at [facility], [address] [place],  [place] [address]  . Sodium 01/18/2018 133* 135 - 145 mmol/L Final  . Potassium 01/18/2018 3.9  3.5 - 5.1 mmol/L Final  . Chloride 01/18/2018 98  98 - 111 mmol/L Final  . CO2 01/18/2018 28  22 - 32 mmol/L Final  . Glucose, Bld 01/18/2018 172* 70 - 99 mg/dL Final  . BUN 01/18/2018 14  8 - 23 mg/dL Final  . Creatinine, Ser 01/18/2018 1.58* 0.61 - 1.24 mg/dL Final  . Calcium 01/18/2018 8.8* 8.9 - 10.3 mg/dL Final  . Total Protein 01/18/2018 6.7  6.5 - 8.1 g/dL Final  . Albumin 01/18/2018 3.3* 3.5 - 5.0 g/dL Final  . AST 01/18/2018 28  15 - 41 U/L Final  . ALT 01/18/2018 17  0 - 44 U/L Final  . Alkaline Phosphatase 01/18/2018 98  38 - 126 U/L Final  . Total Bilirubin 01/18/2018 0.6  0.3 - 1.2 mg/dL Final  . GFR calc non Af Amer 01/18/2018 40* >60 mL/min Final  . GFR calc Af Amer 01/18/2018 47* >60 mL/min Final   Comment: (NOTE) The eGFR has been calculated using the CKD EPI equation. This calculation has not been validated in all clinical situations. eGFR's persistently <60 mL/min signify possible Chronic Kidney Disease.   Georgiann Hahn gap 01/18/2018 7  5 - 15 Final   Performed at [facility], [address] [place], [place] [address]  . Magnesium 01/18/2018 1.7  1.7 - 2.4 mg/dL Final   Performed at [facility], [address], [place], [place] [address]     Pathology Orders Placed This Encounter  Procedures  . CBC with Differential/Platelet    Standing Status:   Future    Standing Expiration Date:   01/24/2019  . Comprehensive metabolic panel    Standing Status:   Future    Standing Expiration Date:   01/24/2019       Zoila Shutter MD

## 2018-01-25 ENCOUNTER — Ambulatory Visit: Payer: Medicare Other | Admitting: Cardiovascular Disease

## 2018-01-25 ENCOUNTER — Encounter: Payer: Self-pay | Admitting: Cardiovascular Disease

## 2018-01-25 VITALS — BP 124/60 | HR 58 | Ht 70.0 in | Wt 186.0 lb

## 2018-01-25 DIAGNOSIS — N183 Chronic kidney disease, stage 3 unspecified: Secondary | ICD-10-CM

## 2018-01-25 DIAGNOSIS — C3490 Malignant neoplasm of unspecified part of unspecified bronchus or lung: Secondary | ICD-10-CM | POA: Diagnosis not present

## 2018-01-25 DIAGNOSIS — I25708 Atherosclerosis of coronary artery bypass graft(s), unspecified, with other forms of angina pectoris: Secondary | ICD-10-CM | POA: Diagnosis not present

## 2018-01-25 DIAGNOSIS — E119 Type 2 diabetes mellitus without complications: Secondary | ICD-10-CM | POA: Diagnosis not present

## 2018-01-25 DIAGNOSIS — I10 Essential (primary) hypertension: Secondary | ICD-10-CM

## 2018-01-25 DIAGNOSIS — Z9289 Personal history of other medical treatment: Secondary | ICD-10-CM

## 2018-01-25 NOTE — Patient Instructions (Addendum)
Your physician wants you to follow-up in:3-4 months with Dr.Koneswaran   Your physician recommends that you continue on your current medications as directed. Please refer to the Current Medication list given to you today.    If you need a refill on your cardiac medications before your next appointment, please call your pharmacy.      No labs or tests today.       Thank you for choosing [facility] !
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
## 2018-02-14 ENCOUNTER — Other Ambulatory Visit (HOSPITAL_COMMUNITY): Payer: Self-pay | Admitting: Hematology

## 2018-02-14 DIAGNOSIS — I10 Essential (primary) hypertension: Secondary | ICD-10-CM

## 2018-02-16 ENCOUNTER — Other Ambulatory Visit (HOSPITAL_COMMUNITY): Payer: Self-pay | Admitting: *Deleted

## 2018-02-16 DIAGNOSIS — Z23 Encounter for immunization: Secondary | ICD-10-CM | POA: Diagnosis not present

## 2018-02-16 DIAGNOSIS — I10 Essential (primary) hypertension: Secondary | ICD-10-CM

## 2018-02-16 MED ORDER — AMLODIPINE BESYLATE 10 MG PO TABS: 10.0000 mg | ORAL_TABLET | Freq: Every day | ORAL | 3 refills | Status: DC

## 2018-02-21 ENCOUNTER — Inpatient Hospital Stay (HOSPITAL_COMMUNITY): Payer: Medicare Other | Attending: Hematology

## 2018-02-21 DIAGNOSIS — I129 Hypertensive chronic kidney disease with stage 1 through stage 4 chronic kidney disease, or unspecified chronic kidney disease: Secondary | ICD-10-CM | POA: Diagnosis not present

## 2018-02-21 DIAGNOSIS — C3411 Malignant neoplasm of upper lobe, right bronchus or lung: Secondary | ICD-10-CM | POA: Insufficient documentation

## 2018-02-21 DIAGNOSIS — N189 Chronic kidney disease, unspecified: Secondary | ICD-10-CM | POA: Insufficient documentation

## 2018-02-21 DIAGNOSIS — E1122 Type 2 diabetes mellitus with diabetic chronic kidney disease: Secondary | ICD-10-CM | POA: Diagnosis not present

## 2018-02-21 DIAGNOSIS — C3490 Malignant neoplasm of unspecified part of unspecified bronchus or lung: Secondary | ICD-10-CM

## 2018-02-21 LAB — COMPREHENSIVE METABOLIC PANEL
ALT: 16 U/L (ref 0–44)
ANION GAP: 6 (ref 5–15)
AST: 24 U/L (ref 15–41)
Albumin: 3.7 g/dL (ref 3.5–5.0)
Alkaline Phosphatase: 102 U/L (ref 38–126)
BILIRUBIN TOTAL: 0.7 mg/dL (ref 0.3–1.2)
BUN: 22 mg/dL (ref 8–23)
CALCIUM: 8.9 mg/dL (ref 8.9–10.3)
CO2: 27 mmol/L (ref 22–32)
Chloride: 98 mmol/L (ref 98–111)
Creatinine, Ser: 1.62 mg/dL — ABNORMAL HIGH (ref 0.61–1.24)
GFR calc Af Amer: 45 mL/min — ABNORMAL LOW (ref >60)
GFR calc non Af Amer: 39 mL/min — ABNORMAL LOW (ref >60)
Glucose, Bld: 155 mg/dL — ABNORMAL HIGH (ref 70–99)
Potassium: 4.7 mmol/L (ref 3.5–5.1)
SODIUM: 131 mmol/L — AB (ref 135–145)
TOTAL PROTEIN: 7.3 g/dL (ref 6.5–8.1)

## 2018-02-21 LAB — CBC WITH DIFFERENTIAL/PLATELET
Abs Immature Granulocytes: 0.01 10*3/uL (ref 0.00–0.07)
BASOS PCT: 0 %
Basophils Absolute: 0 10*3/uL (ref 0.0–0.1)
EOS ABS: 0 10*3/uL (ref 0.0–0.5)
EOS PCT: 1 %
HCT: 38 % — ABNORMAL LOW (ref 39.0–52.0)
Hemoglobin: 12.5 g/dL — ABNORMAL LOW (ref 13.0–17.0)
Immature Granulocytes: 0 %
Lymphocytes Relative: 27 %
Lymphs Abs: 1.5 10*3/uL (ref 0.7–4.0)
MCH: 27.4 pg (ref 26.0–34.0)
MCHC: 32.9 g/dL (ref 30.0–36.0)
MCV: 83.3 fL (ref 80.0–100.0)
Monocytes Absolute: 0.6 10*3/uL (ref 0.1–1.0)
Monocytes Relative: 10 %
Neutro Abs: 3.4 10*3/uL (ref 1.7–7.7)
Neutrophils Relative %: 62 %
Platelets: 218 10*3/uL (ref 150–400)
RBC: 4.56 MIL/uL (ref 4.22–5.81)
RDW: 13.9 % (ref 11.5–15.5)
WBC: 5.6 10*3/uL (ref 4.0–10.5)
nRBC: 0 % (ref 0.0–0.2)

## 2018-02-24 ENCOUNTER — Inpatient Hospital Stay (HOSPITAL_BASED_OUTPATIENT_CLINIC_OR_DEPARTMENT_OTHER): Payer: Medicare Other | Admitting: Hematology

## 2018-02-24 ENCOUNTER — Encounter (HOSPITAL_COMMUNITY): Payer: Self-pay | Admitting: Hematology

## 2018-02-24 VITALS — BP 132/69 | HR 61 | Temp 97.6°F | Resp 20 | Wt 186.0 lb

## 2018-02-24 DIAGNOSIS — C3411 Malignant neoplasm of upper lobe, right bronchus or lung: Secondary | ICD-10-CM | POA: Diagnosis not present

## 2018-02-24 DIAGNOSIS — I129 Hypertensive chronic kidney disease with stage 1 through stage 4 chronic kidney disease, or unspecified chronic kidney disease: Secondary | ICD-10-CM | POA: Diagnosis not present

## 2018-02-24 DIAGNOSIS — N189 Chronic kidney disease, unspecified: Secondary | ICD-10-CM | POA: Diagnosis not present

## 2018-02-24 DIAGNOSIS — C3490 Malignant neoplasm of unspecified part of unspecified bronchus or lung: Secondary | ICD-10-CM

## 2018-02-24 DIAGNOSIS — E1122 Type 2 diabetes mellitus with diabetic chronic kidney disease: Secondary | ICD-10-CM | POA: Diagnosis not present

## 2018-02-24 NOTE — Assessment & Plan Note (Signed)
1.  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  He has multiple bilateral lung nodules making him metastatic.  He had a colonoscopy which showed multiple polyps which were tubular adenomas.  His functional status is decent at this time.  He does not have any malignancy related symptoms at this time.   -Dabrafenib 100 mg twice daily and trametinib 1.5 mg once daily (response rates of 65% and median PFS of 10.9 months) on an empty stomach was started on 10/07/2017.  He is not having any problems.  Upon further questioning and confirmation, we found out that he is taking dabrafenib 50 mg twice daily.  2D echocardiogram dated 10/28/2017 which shows ejection fraction of 60%.  I plan to repeat echocardiogram in 2 to 3 months.  He increased dabrafenib 100 mg twice daily on 11/10/2017.  So far he has tolerated very well.  He went to the ER on 11/13/2017 with right occipital pain which improved with injection.  He is taking Lyrica twice daily which is helping with the pain. -PET CT scan dated 12/19/2017 shows decrease in size of the lung nodules and SUV value. - He is continuing to tolerate dabrafenib 100 mg twice daily and trametinib 1.5 mg once daily on an empty stomach.  He is able to do all his activities. - I will reevaluate him in 4 weeks.  I plan to repeat a 2D echocardiogram. -I will also repeat a PET CT scan prior to next visit.  His blood work today reviewed by me was mostly within normal limits.  2.  Hypertension: He will continue metoprolol 100 mg twice daily.  We have added Norvasc 10 mg daily on 10/18/2017.  His blood pressure today is well controlled.    3.  CKD: - This has been stable with a creatinine around 1.62. 

## 2018-02-24 NOTE — Progress Notes (Signed)
[place], [place] [address]   CLINIC:  Medical Oncology/Hematology  PCP:  [name], MD [address] [place] [address] [phone]   REASON FOR VISIT: Follow-up for metastatic lung cancer  CURRENT THERAPY: Dabrafenib and Trametinib   INTERVAL HISTORY:  [name] 77 y.o. male returns for routine follow-up for metastatic lung cancer. Patient is here today with his wife. He is doing well with treatment. He has non  Complaints at this time. Patient reports his appetite at 75% and he is maintaining his weight. His energy level is 100%. He lives at home with his wife and performs all his own ADLs and activities. He denies any new pains. Denies any rashes. Denies any worsening of his cough. Denies any nausea, vomiting, or diarrhea.     REVIEW OF SYSTEMS:  Review of Systems  All other systems reviewed and are negative.    PAST MEDICAL/SURGICAL HISTORY:  Past Medical History:  Diagnosis Date  . Cancer ([place])   . Chronic kidney disease   . Coronary artery disease   . Diabetes mellitus   . Hypertension   . 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 Spontaneous pneumothorax 09/15/2010   right   Past Surgical History:  Procedure Laterality Date  . ANKLE FRACTURE SURGERY  2008   [facility]  . APPENDECTOMY  1960's  . CHEST TUBE INSERTION Right 09/15/2010   Dr Servando Snare - spontaneous PTX  . CHOLECYSTECTOMY  2008  . COLONOSCOPY N/A 09/07/2017   Procedure: COLONOSCOPY;  Surgeon: Daneil Dolin, MD;  Location: [facility];  Service: Endoscopy;  Laterality: N/A;  10:30am  . CORONARY ARTERY BYPASS GRAFT  2003  . EYE SURGERY  2001   Cataracts removed bilaterally  . INCISIONAL HERNIA REPAIR N/A 01/29/2015   Procedure: Fatima Blank HERNIORRHAPHY WITH MESH;  Surgeon: Aviva Signs, MD;  Location: [facility] ORS;  Service: General;   Laterality: N/A;  . INSERTION OF MESH N/A 01/29/2015   Procedure: INSERTION OF MESH;  Surgeon: Aviva Signs, MD;  Location: [facility] ORS;  Service: General;  Laterality: N/A;  . [place]  . LEFT HEART CATH AND CORS/GRAFTS ANGIOGRAPHY N/A 06/20/2017   Procedure: LEFT HEART CATH AND CORS/GRAFTS ANGIOGRAPHY;  Surgeon: Martinique, Peter M, MD;  Location: [facility];  Service: Cardiovascular;  Laterality: N/A;  . ORIF ANKLE FRACTURE  08/26/2011   Procedure: OPEN REDUCTION INTERNAL FIXATION (ORIF) ANKLE FRACTURE;  Surgeon: Sanjuana Kava, MD;  Location: [facility] OR[facility];  Service: Orthopedics;  Laterality: Right;  . POLYPECTOMY  09/07/2017   Procedure: POLYPECTOMY;  Surgeon: Daneil Dolin, MD;  Location: [facility];  Service: Endoscopy;;  ascending x2 (cold snare)  . PROSTATE SURGERY  2012   Enlarged prostate  . TRANSURETHRAL RESECTION OF PROSTATE  2012     SOCIAL HISTORY:  Social History   Socioeconomic History  . Marital status: Married    Spouse name: Not on file  . Number of children: Not on file  . Years of education: Not on file  . Highest education level: Not on file  Occupational History  . Not on file  Social Needs  . Financial resource strain: Not on file  . Food insecurity:    Worry: Not on file    Inability: Not on file  .  Transportation needs:    Medical: Not on file    Non-medical: Not on file  Tobacco Use  . Smoking status: Former Smoker    Packs/day: 1.00    Years: 33.00    Pack years: 33.00    Last attempt to quit: 05/17/1987    Years since quitting: 30.7  . Smokeless tobacco: Never Used  Substance and Sexual Activity  . Alcohol use: No  . Drug use: No  . Sexual activity: Yes  Lifestyle  . Physical activity:    Days per week: Not on file    Minutes per session: Not on file  . Stress: Not on file  Relationships  . Social connections:    Talks on phone: Not on file    Gets together: Not on file    Attends religious service: Not  on file    Active member of club or organization: Not on file    Attends meetings of clubs or organizations: Not on file    Relationship status: Not on file  . Intimate partner violence:    Fear of current or ex partner: Not on file    Emotionally abused: Not on file    Physically abused: Not on file    Forced sexual activity: Not on file  Other Topics Concern  . Not on file  Social History Narrative  . Not on file    FAMILY HISTORY:  Family History  Problem Relation Age of Onset  . Aneurysm Mother   . Heart attack Father   . Colon cancer Maternal Uncle   . Cancer Brother   . Gastric cancer Neg Hx   . Esophageal cancer Neg Hx     CURRENT MEDICATIONS:  Outpatient Encounter Medications as of 02/24/2018  Medication Sig  . amLODipine (NORVASC) 10 MG tablet Take 1 tablet (10 mg total) by mouth daily.  Marland Kitchen aspirin EC 81 MG tablet Take 81 mg by mouth daily.  . Cholecalciferol (VITAMIN D3) 5000 units CAPS Take 5,000 Units by mouth daily.  Marland Kitchen dabrafenib mesylate (TAFLINAR) 50 MG capsule Take 2 capsules (100 mg total) by mouth 2 (two) times daily. Take on an empty stomach 1 hour before or 2 hours after meals.  Marland Kitchen glimepiride (AMARYL) 2 MG tablet Take 2 mg by mouth daily with breakfast.   . isosorbide mononitrate (IMDUR) 30 MG 24 hr tablet Take 1 tablet (30 mg total) by mouth daily.  . metFORMIN (GLUCOPHAGE) 500 MG tablet Take 1 tablet (500 mg total) by mouth 2 (two) times daily with a meal.  . metoprolol (LOPRESSOR) 100 MG tablet Take 100 mg by mouth 2 (two) times daily.  . nitroGLYCERIN (NITROSTAT) 0.4 MG SL tablet Place 0.4 mg under the tongue every 5 (five) minutes as needed for chest pain.   Marland Kitchen ondansetron (ZOFRAN) 8 MG tablet Take 1 tablet (8 mg total) by mouth every 8 (eight) hours as needed for nausea or vomiting.  . pioglitazone (ACTOS) 45 MG tablet Take 45 mg by mouth daily.  . prochlorperazine (COMPAZINE) 10 MG tablet Take 1 tablet (10 mg total) by mouth every 6 (six) hours as  needed for nausea or vomiting.  . sitaGLIPtin (JANUVIA) 50 MG tablet Take 50 mg by mouth daily.   . trametinib dimethyl sulfoxide (MEKINIST) 0.5 MG tablet Take 3 tablets (1.5 mg total) by mouth daily. Take 1 hour before or 2 hours after a meal. Store refrigerated in original container.   No facility-administered encounter medications on file as of 02/24/2018.  ALLERGIES:  No Known Allergies   PHYSICAL EXAM:  ECOG Performance status: 1  Vitals:   02/24/18 1023  BP: 132/69  Pulse: 61  Resp: 20  Temp: 97.6 F (36.4 C)  SpO2: 98%   Filed Weights   02/24/18 1023  Weight: 186 lb (84.4 kg)    Physical Exam  Constitutional: He is oriented to person, place, and time. He appears well-developed and well-nourished.  Cardiovascular: Normal rate, regular rhythm and normal heart sounds.  Pulmonary/Chest: Effort normal and breath sounds normal.  Musculoskeletal: Normal range of motion.  Neurological: He is alert and oriented to person, place, and time.  Skin: Skin is warm and dry.  Psychiatric: He has a normal mood and affect. His behavior is normal. Judgment and thought content normal.     LABORATORY DATA:  I have reviewed the labs as listed.  CBC    Component Value Date/Time   WBC 5.6 02/21/2018 1105   RBC 4.56 02/21/2018 1105   HGB 12.5 (L) 02/21/2018 1105   HCT 38.0 (L) 02/21/2018 1105   PLT 218 02/21/2018 1105   MCV 83.3 02/21/2018 1105   MCH 27.4 02/21/2018 1105   MCHC 32.9 02/21/2018 1105   RDW 13.9 02/21/2018 1105   LYMPHSABS 1.5 02/21/2018 1105   MONOABS 0.6 02/21/2018 1105   EOSABS 0.0 02/21/2018 1105   BASOSABS 0.0 02/21/2018 1105   CMP Latest Ref Rng & Units 02/21/2018 01/18/2018 12/27/2017  Glucose 70 - 99 mg/dL 155(H) 172(H) 196(H)  BUN 8 - 23 mg/dL _0 Creatinine 0.61 - 1.24 mg/dL 1.62(H) 1.58(H) 1.59(H)  Sodium 135 - 145 mmol/L 131(L) 133(L) 133(L)  Potassium 3.5 - 5.1 mmol/L 4.7 3.9 4.5  Chloride 98 - 111 mmol/L 98 98 98  CO2 22 - 32 mmol/L _1 Calcium 8.9 - 10.3 mg/dL 8.9 8.8(L) 8.8(L)  Total Protein 6.5 - 8.1 g/dL 7.3 6.7 -  Total Bilirubin 0.3 - 1.2 mg/dL 0.7 0.6 -  Alkaline Phos 38 - 126 U/L 102 98 -  AST 15 - 41 U/L 24 28 -  ALT 0 - 44 U/L 16 17 -       DIAGNOSTIC IMAGING:  I have reviewed PET CT scan from 12/19/2017 and discussed with the patient.     ASSESSMENT & PLAN:   Adenocarcinoma of lung, stage 4 ([place]) 1.  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  He has multiple bilateral lung nodules making him metastatic.  He had a colonoscopy which showed multiple polyps which were tubular adenomas.  His functional status is decent at this time.  He does not have any malignancy related symptoms at this time.   -Dabrafenib 100 mg twice daily and trametinib 1.5 mg once daily (response rates of 65% and median PFS of 10.9 months) on an empty stomach was started on 10/07/2017.  He is not having any problems.  Upon further questioning and confirmation, we found out that he is taking dabrafenib 50 mg twice daily.  2D echocardiogram dated 10/28/2017 which shows ejection fraction of 60%.  I plan to repeat echocardiogram in 2 to 3 months.  He increased dabrafenib 100 mg twice daily on 11/10/2017.  So far he has tolerated very well.  He went to the ER on 11/13/2017 with right occipital pain which improved with injection.  He is  taking Lyrica twice daily which is helping with the pain. -PET CT scan dated 12/19/2017 shows decrease in size of the lung nodules and SUV value. - He is continuing to tolerate dabrafenib 100 mg twice daily and trametinib 1.5 mg once daily on an empty stomach.  He is able to do all his activities. - I will reevaluate him in 4 weeks.  I plan to repeat a 2D echocardiogram. -I will also repeat a PET CT scan prior to  next visit.  His blood work today reviewed by me was mostly within normal limits.  2.  Hypertension: He will continue metoprolol 100 mg twice daily.  We have added Norvasc 10 mg daily on 10/18/2017.  His blood pressure today is well controlled.    3.  CKD: - This has been stable with a creatinine around 1.62.      Orders placed this encounter:  Orders Placed This Encounter  Procedures  . NM PET Image Restag (PS) Skull Base To Thigh  . Magnesium  . CBC with Differential/Platelet  . Comprehensive metabolic panel  . ECHOCARDIOGRAM COMPLETE      Derek Jack, [place] [phone]

## 2018-02-24 NOTE — Patient Instructions (Signed)
[facility] at [facility] Discharge Instructions     Thank you for choosing [facility] at [facility] to provide your oncology and hematology care.  To afford each patient quality time with our provider, please arrive at least 15 minutes before your scheduled appointment time.   If you have a lab appointment with the Cancer Center please come in thru the  Main Entrance and check in at the main information desk  You need to re-schedule your appointment should you arrive 10 or more minutes late.  We strive to give you quality time with our providers, and arriving late affects you and other patients whose appointments are after yours.  Also, if you no show three or more times for appointments you may be dismissed from the clinic at the providers discretion.     Again, thank you for choosing [facility].  Our hope is that these requests will decrease the amount of time that you wait before being seen by our physicians.       _____________________________________________________________  Should you have questions after your visit to [facility], please contact our office at [phone] between the hours of 8:00 a.m. and 4:30 p.m.  Voicemails left after 4:00 p.m. will not be returned until the following business day.  For prescription refill requests, have your pharmacy contact our office and allow 72 hours.    Cancer Center Support Programs:   > Cancer Support Group  2nd Tuesday of the month 1pm-2pm, Journey Room    

## 2018-03-13 DIAGNOSIS — E1129 Type 2 diabetes mellitus with other diabetic kidney complication: Secondary | ICD-10-CM | POA: Diagnosis not present

## 2018-03-13 DIAGNOSIS — I2510 Atherosclerotic heart disease of native coronary artery without angina pectoris: Secondary | ICD-10-CM | POA: Diagnosis not present

## 2018-03-13 DIAGNOSIS — Z79899 Other long term (current) drug therapy: Secondary | ICD-10-CM | POA: Diagnosis not present

## 2018-03-20 ENCOUNTER — Ambulatory Visit (HOSPITAL_COMMUNITY)
Admission: RE | Admit: 2018-03-20 | Discharge: 2018-03-20 | Disposition: A | Discharge status: Home / Self Care | Payer: Medicare Other | Source: Ambulatory Visit | Attending: Nurse Practitioner | Admitting: Nurse Practitioner

## 2018-03-20 DIAGNOSIS — I209 Angina pectoris, unspecified: Secondary | ICD-10-CM | POA: Insufficient documentation

## 2018-03-20 DIAGNOSIS — I081 Rheumatic disorders of both mitral and tricuspid valves: Secondary | ICD-10-CM | POA: Diagnosis not present

## 2018-03-20 DIAGNOSIS — E119 Type 2 diabetes mellitus without complications: Secondary | ICD-10-CM | POA: Insufficient documentation

## 2018-03-20 DIAGNOSIS — C3490 Malignant neoplasm of unspecified part of unspecified bronchus or lung: Secondary | ICD-10-CM | POA: Diagnosis not present

## 2018-03-20 NOTE — Progress Notes (Signed)
*  PRELIMINARY RESULTS* Echocardiogram 2D Echocardiogram has been performed.  Leavy Cella 03/20/2018, 10:24 AM

## 2018-03-22 DIAGNOSIS — N183 Chronic kidney disease, stage 3 (moderate): Secondary | ICD-10-CM | POA: Diagnosis not present

## 2018-03-22 DIAGNOSIS — I25119 Atherosclerotic heart disease of native coronary artery with unspecified angina pectoris: Secondary | ICD-10-CM | POA: Diagnosis not present

## 2018-03-22 DIAGNOSIS — E1122 Type 2 diabetes mellitus with diabetic chronic kidney disease: Secondary | ICD-10-CM | POA: Diagnosis not present

## 2018-03-27 ENCOUNTER — Other Ambulatory Visit (HOSPITAL_COMMUNITY): Payer: Self-pay

## 2018-03-27 ENCOUNTER — Encounter (HOSPITAL_COMMUNITY): Payer: Medicare Other

## 2018-03-28 ENCOUNTER — Ambulatory Visit (HOSPITAL_COMMUNITY): Payer: Self-pay | Admitting: Hematology

## 2018-04-10 ENCOUNTER — Ambulatory Visit (HOSPITAL_COMMUNITY)
Admission: RE | Admit: 2018-04-10 | Discharge: 2018-04-10 | Disposition: A | Discharge status: Home / Self Care | Payer: Medicare Other | Source: Ambulatory Visit | Attending: Nurse Practitioner | Admitting: Nurse Practitioner

## 2018-04-10 DIAGNOSIS — C3490 Malignant neoplasm of unspecified part of unspecified bronchus or lung: Secondary | ICD-10-CM | POA: Insufficient documentation

## 2018-04-10 MED ORDER — FLUDEOXYGLUCOSE F - 18 (FDG) INJECTION
12.5000 | Freq: Once | INTRAVENOUS | Status: AC | PRN
  Administered 2018-04-10: 12.5 via INTRAVENOUS

## 2018-04-18 ENCOUNTER — Inpatient Hospital Stay (HOSPITAL_COMMUNITY): Payer: Medicare Other | Attending: Nurse Practitioner

## 2018-04-18 DIAGNOSIS — Z7984 Long term (current) use of oral hypoglycemic drugs: Secondary | ICD-10-CM | POA: Insufficient documentation

## 2018-04-18 DIAGNOSIS — Z9221 Personal history of antineoplastic chemotherapy: Secondary | ICD-10-CM | POA: Insufficient documentation

## 2018-04-18 DIAGNOSIS — Z951 Presence of aortocoronary bypass graft: Secondary | ICD-10-CM | POA: Insufficient documentation

## 2018-04-18 DIAGNOSIS — R0602 Shortness of breath: Secondary | ICD-10-CM | POA: Diagnosis not present

## 2018-04-18 DIAGNOSIS — I10 Essential (primary) hypertension: Secondary | ICD-10-CM | POA: Insufficient documentation

## 2018-04-18 DIAGNOSIS — Z79899 Other long term (current) drug therapy: Secondary | ICD-10-CM | POA: Diagnosis not present

## 2018-04-18 DIAGNOSIS — C3490 Malignant neoplasm of unspecified part of unspecified bronchus or lung: Secondary | ICD-10-CM | POA: Diagnosis not present

## 2018-04-18 DIAGNOSIS — E119 Type 2 diabetes mellitus without complications: Secondary | ICD-10-CM | POA: Diagnosis not present

## 2018-04-18 DIAGNOSIS — Z87891 Personal history of nicotine dependence: Secondary | ICD-10-CM | POA: Insufficient documentation

## 2018-04-18 DIAGNOSIS — Z7982 Long term (current) use of aspirin: Secondary | ICD-10-CM | POA: Insufficient documentation

## 2018-04-18 DIAGNOSIS — N189 Chronic kidney disease, unspecified: Secondary | ICD-10-CM | POA: Insufficient documentation

## 2018-04-18 DIAGNOSIS — R42 Dizziness and giddiness: Secondary | ICD-10-CM | POA: Insufficient documentation

## 2018-04-18 LAB — COMPREHENSIVE METABOLIC PANEL
ALT: 13 U/L (ref 0–44)
AST: 17 U/L (ref 15–41)
Albumin: 3.4 g/dL — ABNORMAL LOW (ref 3.5–5.0)
Alkaline Phosphatase: 92 U/L (ref 38–126)
Anion gap: 7 (ref 5–15)
BUN: 18 mg/dL (ref 8–23)
CHLORIDE: 97 mmol/L — AB (ref 98–111)
CO2: 25 mmol/L (ref 22–32)
Calcium: 8.8 mg/dL — ABNORMAL LOW (ref 8.9–10.3)
Creatinine, Ser: 1.53 mg/dL — ABNORMAL HIGH (ref 0.61–1.24)
GFR, EST AFRICAN AMERICAN: 50 mL/min — AB (ref >60)
GFR, EST NON AFRICAN AMERICAN: 43 mL/min — AB (ref >60)
Glucose, Bld: 308 mg/dL — ABNORMAL HIGH (ref 70–99)
POTASSIUM: 4.3 mmol/L (ref 3.5–5.1)
SODIUM: 129 mmol/L — AB (ref 135–145)
Total Bilirubin: 0.5 mg/dL (ref 0.3–1.2)
Total Protein: 7.3 g/dL (ref 6.5–8.1)

## 2018-04-18 LAB — CBC WITH DIFFERENTIAL/PLATELET
ABS IMMATURE GRANULOCYTES: 0.02 10*3/uL (ref 0.00–0.07)
BASOS PCT: 1 %
Basophils Absolute: 0 10*3/uL (ref 0.0–0.1)
EOS PCT: 0 %
Eosinophils Absolute: 0 10*3/uL (ref 0.0–0.5)
HCT: 39.1 % (ref 39.0–52.0)
Hemoglobin: 12.7 g/dL — ABNORMAL LOW (ref 13.0–17.0)
IMMATURE GRANULOCYTES: 0 %
LYMPHS PCT: 14 %
Lymphs Abs: 0.9 10*3/uL (ref 0.7–4.0)
MCH: 26.4 pg (ref 26.0–34.0)
MCHC: 32.5 g/dL (ref 30.0–36.0)
MCV: 81.3 fL (ref 80.0–100.0)
MONO ABS: 0.4 10*3/uL (ref 0.1–1.0)
MONOS PCT: 6 %
NEUTROS ABS: 5 10*3/uL (ref 1.7–7.7)
Neutrophils Relative %: 79 %
PLATELETS: 230 10*3/uL (ref 150–400)
RBC: 4.81 MIL/uL (ref 4.22–5.81)
RDW: 15.1 % (ref 11.5–15.5)
WBC: 6.3 10*3/uL (ref 4.0–10.5)
nRBC: 0 % (ref 0.0–0.2)

## 2018-04-18 LAB — MAGNESIUM: MAGNESIUM: 1.8 mg/dL (ref 1.7–2.4)

## 2018-04-19 ENCOUNTER — Other Ambulatory Visit (HOSPITAL_COMMUNITY): Payer: Self-pay

## 2018-04-20 ENCOUNTER — Other Ambulatory Visit: Payer: Self-pay

## 2018-04-20 ENCOUNTER — Encounter (HOSPITAL_COMMUNITY): Payer: Self-pay | Admitting: Hematology

## 2018-04-20 ENCOUNTER — Inpatient Hospital Stay (HOSPITAL_COMMUNITY): Payer: Medicare Other | Admitting: Hematology

## 2018-04-20 VITALS — BP 120/57 | HR 66 | Temp 97.8°F | Resp 20 | Wt 186.3 lb

## 2018-04-20 DIAGNOSIS — Z7982 Long term (current) use of aspirin: Secondary | ICD-10-CM | POA: Diagnosis not present

## 2018-04-20 DIAGNOSIS — R42 Dizziness and giddiness: Secondary | ICD-10-CM

## 2018-04-20 DIAGNOSIS — Z87891 Personal history of nicotine dependence: Secondary | ICD-10-CM | POA: Diagnosis not present

## 2018-04-20 DIAGNOSIS — Z7984 Long term (current) use of oral hypoglycemic drugs: Secondary | ICD-10-CM | POA: Diagnosis not present

## 2018-04-20 DIAGNOSIS — Z79899 Other long term (current) drug therapy: Secondary | ICD-10-CM

## 2018-04-20 DIAGNOSIS — C3490 Malignant neoplasm of unspecified part of unspecified bronchus or lung: Secondary | ICD-10-CM

## 2018-04-20 DIAGNOSIS — Z9221 Personal history of antineoplastic chemotherapy: Secondary | ICD-10-CM | POA: Diagnosis not present

## 2018-04-20 DIAGNOSIS — E119 Type 2 diabetes mellitus without complications: Secondary | ICD-10-CM | POA: Diagnosis not present

## 2018-04-20 DIAGNOSIS — Z951 Presence of aortocoronary bypass graft: Secondary | ICD-10-CM | POA: Diagnosis not present

## 2018-04-20 DIAGNOSIS — I10 Essential (primary) hypertension: Secondary | ICD-10-CM | POA: Diagnosis not present

## 2018-04-20 DIAGNOSIS — N189 Chronic kidney disease, unspecified: Secondary | ICD-10-CM

## 2018-04-20 DIAGNOSIS — R0602 Shortness of breath: Secondary | ICD-10-CM | POA: Diagnosis not present

## 2018-04-20 DIAGNOSIS — Z7902 Long term (current) use of antithrombotics/antiplatelets: Secondary | ICD-10-CM

## 2018-04-20 NOTE — Patient Instructions (Signed)
[facility] at [facility] Discharge Instructions     Thank you for choosing [facility] at [facility] to provide your oncology and hematology care.  To afford each patient quality time with our provider, please arrive at least 15 minutes before your scheduled appointment time.   If you have a lab appointment with the Cancer Center please come in thru the  Main Entrance and check in at the main information desk  You need to re-schedule your appointment should you arrive 10 or more minutes late.  We strive to give you quality time with our providers, and arriving late affects you and other patients whose appointments are after yours.  Also, if you no show three or more times for appointments you may be dismissed from the clinic at the providers discretion.     Again, thank you for choosing [facility].  Our hope is that these requests will decrease the amount of time that you wait before being seen by our physicians.       _____________________________________________________________  Should you have questions after your visit to  Cancer Center, please contact our office at [phone] between the hours of 8:00 a.m. and 4:30 p.m.  Voicemails left after 4:00 p.m. will not be returned until the following business day.  For prescription refill requests, have your pharmacy contact our office and allow 72 hours.    Cancer Center Support Programs:   > Cancer Support Group  2nd Tuesday of the month 1pm-2pm, Journey Room    

## 2018-04-20 NOTE — Assessment & Plan Note (Signed)
1.  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  He has multiple bilateral lung nodules making him metastatic.  He had a colonoscopy which showed multiple polyps which were tubular adenomas.  His functional status is decent at this time.  He does not have any malignancy related symptoms at this time.   -Dabrafenib 100 mg twice daily and trametinib 1.5 mg once daily (response rates of 65% and median PFS of 10.9 months) on an empty stomach was started on 10/07/2017.  He is not having any problems.  Upon further questioning and confirmation, we found out that he is taking dabrafenib 50 mg twice daily.  2D echocardiogram dated 10/28/2017 which shows ejection fraction of 60%.  I plan to repeat echocardiogram in 2 to 3 months.  He increased dabrafenib 100 mg twice daily on 11/10/2017.  So far he has tolerated very well.  He went to the ER on 11/13/2017 with right occipital pain which improved with injection.  He is taking Lyrica twice daily which is helping with the pain. -PET CT scan dated 12/19/2017 shows decrease in size of the lung nodules and SUV value. - I discussed the results of the PET scan dated 04/11/2018 which shows decrease in size of the innumerable scattered pulmonary nodules.  There is some increased conspicuity of a confluence of nodules in the left upper lobe with associated groundglass opacity.  This is a maximum SUV of 3 today, previously 1.9.  Given the configuration, this could be a focus of atypical bronchiolitis, he does have something of tree-in-bud nodularity.  No new findings of metastatic disease. - I have reviewed his echocardiogram dated 03/20/2018.  Ejection fraction is 55%.  This is down from 60% on 10/28/2017.  Baseline echocardiogram on 06/29/2017 shows EF of 60 to 65%. - I have reviewed  his medications.  Trametinib can cause pneumonitis.  It also can cause decrease in ejection fraction. -I have recommended decreasing trametinib to 1 mg daily.  He will continue dabrafenib 100 mg twice daily. -I will reevaluate him in 4 weeks.  I plan to repeat another echocardiogram prior to next visit.  2.  Hypertension: He will continue metoprolol 100 mg twice daily and Norvasc 10 mg daily.  His blood pressure today is well controlled.    3.  CKD: - This has been stable with a creatinine around 1.62.

## 2018-04-20 NOTE — Progress Notes (Signed)
[place], [place] [address]   CLINIC:  Medical Oncology/Hematology  PCP:  [name], MD [address] [place] [address] [phone]   REASON FOR VISIT: Follow-up for Metastatic non-small cell lung cancer  CURRENT THERAPY: Dabrafenib and Trametinib   INTERVAL HISTORY:  [name] 77 y.o. male returns for routine follow-up for metastatic non small cell lung cancer. He is here today with his wife. He reports Sob at times mostly with exertion. He also has dizziness when he stands. He denies any edema. Denies any CP. Denies any nausea, vomiting, or diarrhea. Denies any new cough or hemoptysis. He reports his appetite and energy level at 50% and he is maintaining his weight well. He has an appointment with his cardiac doctor at the end of January.     REVIEW OF SYSTEMS:  Review of Systems  Respiratory: Positive for shortness of breath.   Neurological: Positive for dizziness.  All other systems reviewed and are negative.    PAST MEDICAL/SURGICAL HISTORY:  Past Medical History:  Diagnosis Date  . Cancer ([place])   . Chronic kidney disease   . Coronary artery disease   . Diabetes mellitus   . Hypertension   . 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 Spontaneous pneumothorax 09/15/2010   right   Past Surgical History:  Procedure Laterality Date  . ANKLE FRACTURE SURGERY  2008   [facility]  . APPENDECTOMY  1960's  . CHEST TUBE INSERTION Right 09/15/2010   Dr Servando Snare - spontaneous PTX  . CHOLECYSTECTOMY  2008  . COLONOSCOPY N/A 09/07/2017   Procedure: COLONOSCOPY;  Surgeon: Daneil Dolin, MD;  Location: [facility];  Service: Endoscopy;  Laterality: N/A;  10:30am  . CORONARY ARTERY BYPASS GRAFT  2003  . EYE SURGERY  2001   Cataracts removed bilaterally  . INCISIONAL HERNIA REPAIR N/A 01/29/2015   Procedure:  Fatima Blank HERNIORRHAPHY WITH MESH;  Surgeon: Aviva Signs, MD;  Location: [facility] ORS;  Service: General;  Laterality: N/A;  . INSERTION OF MESH N/A 01/29/2015   Procedure: INSERTION OF MESH;  Surgeon: Aviva Signs, MD;  Location: [facility] ORS;  Service: General;  Laterality: N/A;  . [place]  . LEFT HEART CATH AND CORS/GRAFTS ANGIOGRAPHY N/A 06/20/2017   Procedure: LEFT HEART CATH AND CORS/GRAFTS ANGIOGRAPHY;  Surgeon: Martinique, Peter M, MD;  Location: [facility] CV LAB;  Service: Cardiovascular;  Laterality: N/A;  . ORIF ANKLE FRACTURE  08/26/2011   Procedure: OPEN REDUCTION INTERNAL FIXATION (ORIF) ANKLE FRACTURE;  Surgeon: Sanjuana Kava, MD;  Location: [facility] ORS;  Service: Orthopedics;  Laterality: Right;  . POLYPECTOMY  09/07/2017   Procedure: POLYPECTOMY;  Surgeon: Daneil Dolin, MD;  Location[facility];  Service: Endoscopy;;  ascending x2 (cold snare)  . PROSTATE SURGERY  2012   Enlarged prostate  . TRANSURETHRAL RESECTION OF PROSTATE  2012     SOCIAL HISTORY:  Social History   Socioeconomic History  . Marital status: Married    Spouse name: Not on file  . Number of children: Not on file  . Years of education: Not on file  . Highest education level: Not on file  Occupational History  . Not on file  Social Needs  . Financial resource strain: Not on file  . Food insecurity:  Worry: Not on file    Inability: Not on file  . Transportation needs:    Medical: Not on file    Non-medical: Not on file  Tobacco Use  . Smoking status: Former Smoker    Packs/day: 1.00    Years: 33.00    Pack years: 33.00    Last attempt to quit: 05/17/1987    Years since quitting: 30.9  . Smokeless tobacco: Never Used  Substance and Sexual Activity  . Alcohol use: No  . Drug use: No  . Sexual activity: Yes  Lifestyle  . Physical activity:    Days per week: Not on file    Minutes per session: Not on file  . Stress: Not on file  Relationships  . Social  connections:    Talks on phone: Not on file    Gets together: Not on file    Attends religious service: Not on file    Active member of club or organization: Not on file    Attends meetings of clubs or organizations: Not on file    Relationship status: Not on file  . Intimate partner violence:    Fear of current or ex partner: Not on file    Emotionally abused: Not on file    Physically abused: Not on file    Forced sexual activity: Not on file  Other Topics Concern  . Not on file  Social History Narrative  . Not on file    FAMILY HISTORY:  Family History  Problem Relation Age of Onset  . Aneurysm Mother   . Heart attack Father   . Colon cancer Maternal Uncle   . Cancer Brother   . Gastric cancer Neg Hx   . Esophageal cancer Neg Hx     CURRENT MEDICATIONS:  Outpatient Encounter Medications as of 04/20/2018  Medication Sig  . amLODipine (NORVASC) 10 MG tablet Take 1 tablet (10 mg total) by mouth daily.  Marland Kitchen aspirin EC 81 MG tablet Take 81 mg by mouth daily.  . Cholecalciferol (VITAMIN D3) 5000 units CAPS Take 5,000 Units by mouth daily.  Marland Kitchen dabrafenib mesylate (TAFLINAR) 50 MG capsule Take 2 capsules (100 mg total) by mouth 2 (two) times daily. Take on an empty stomach 1 hour before or 2 hours after meals.  Marland Kitchen escitalopram (LEXAPRO) 10 MG tablet   . glimepiride (AMARYL) 2 MG tablet Take 2 mg by mouth daily with breakfast.   . isosorbide mononitrate (IMDUR) 30 MG 24 hr tablet   . metFORMIN (GLUCOPHAGE) 500 MG tablet Take 1 tablet (500 mg total) by mouth 2 (two) times daily with a meal.  . metoprolol (LOPRESSOR) 100 MG tablet Take 100 mg by mouth 2 (two) times daily.  . nitroGLYCERIN (NITROSTAT) 0.4 MG SL tablet Place 0.4 mg under the tongue every 5 (five) minutes as needed for chest pain.   Marland Kitchen ondansetron (ZOFRAN) 8 MG tablet Take 1 tablet (8 mg total) by mouth every 8 (eight) hours as needed for nausea or vomiting.  . ONE TOUCH ULTRA TEST test strip   . pioglitazone (ACTOS) 45 MG  tablet Take 45 mg by mouth daily.  . prochlorperazine (COMPAZINE) 10 MG tablet Take 1 tablet (10 mg total) by mouth every 6 (six) hours as needed for nausea or vomiting.  . sitaGLIPtin (JANUVIA) 50 MG tablet Take 50 mg by mouth daily.   . trametinib dimethyl sulfoxide (MEKINIST) 0.5 MG tablet Take 3 tablets (1.5 mg total) by mouth daily. Take 1 hour before or 2  hours after a meal. Store refrigerated in original container.  . [DISCONTINUED] isosorbide mononitrate (IMDUR) 30 MG 24 hr tablet Take 1 tablet (30 mg total) by mouth daily.   No facility-administered encounter medications on file as of 04/20/2018.     ALLERGIES:  No Known Allergies   PHYSICAL EXAM:  ECOG Performance status: 1  Vitals:   04/20/18 1015  BP: (!) 120/57  Pulse: 66  Resp: 20  Temp: 97.8 F (36.6 C)  SpO2: 98%   Filed Weights   04/20/18 1015  Weight: 186 lb 4.8 oz (84.5 kg)    Physical Exam  Constitutional: He is oriented to person, place, and time. He appears well-developed and well-nourished.  Cardiovascular: Normal rate, regular rhythm and normal heart sounds.  Pulmonary/Chest: He has wheezes.  Musculoskeletal: Normal range of motion.  Neurological: He is alert and oriented to person, place, and time.  Skin: Skin is warm and dry.  Psychiatric: He has a normal mood and affect. His behavior is normal. Judgment and thought content normal.     LABORATORY DATA:  I have reviewed the labs as listed.  CBC    Component Value Date/Time   WBC 6.3 04/18/2018 1049   RBC 4.81 04/18/2018 1049   HGB 12.7 (L) 04/18/2018 1049   HCT 39.1 04/18/2018 1049   PLT 230 04/18/2018 1049   MCV 81.3 04/18/2018 1049   MCH 26.4 04/18/2018 1049   MCHC 32.5 04/18/2018 1049   RDW 15.1 04/18/2018 1049   LYMPHSABS 0.9 04/18/2018 1049   MONOABS 0.4 04/18/2018 1049   EOSABS 0.0 04/18/2018 1049   BASOSABS 0.0 04/18/2018 1049   CMP Latest Ref Rng & Units 04/18/2018 02/21/2018 01/18/2018  Glucose 70 - 99 mg/dL 308(H) 155(H)  172(H)  BUN 8 - 23 mg/dL '18 22 14  ' Creatinine 0.61 - 1.24 mg/dL 1.53(H) 1.62(H) 1.58(H)  Sodium 135 - 145 mmol/L 129(L) 131(L) 133(L)  Potassium 3.5 - 5.1 mmol/L 4.3 4.7 3.9  Chloride 98 - 111 mmol/L 97(L) 98 98  CO2 22 - 32 mmol/L '25 27 28  ' Calcium 8.9 - 10.3 mg/dL 8.8(L) 8.9 8.8(L)  Total Protein 6.5 - 8.1 g/dL 7.3 7.3 6.7  Total Bilirubin 0.3 - 1.2 mg/dL 0.5 0.7 0.6  Alkaline Phos 38 - 126 U/L 92 102 98  AST 15 - 41 U/L '17 24 28  ' ALT 0 - 44 U/L '13 16 17       ' DIAGNOSTIC IMAGING:  I have independently reviewed the scans and discussed with the patient.   I have reviewed Francene Finders, NP's note and agree with the documentation.  I personally performed a face-to-face visit, made revisions and my assessment and plan is as follows.    ASSESSMENT & PLAN:   Adenocarcinoma of lung, stage 4 ([place]) 1.  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  He has multiple bilateral lung nodules making him metastatic.  He had a colonoscopy which showed multiple polyps which were tubular adenomas.  His functional status is decent at this time.  He does not have any malignancy related symptoms at this time.   -Dabrafenib 100 mg twice daily and trametinib 1.5 mg once daily (response rates of 65% and median PFS of 10.9 months) on an empty stomach was started on 10/07/2017.  He is not having any problems.  Upon further questioning and confirmation,  we found out that he is taking dabrafenib 50 mg twice daily.  2D echocardiogram dated 10/28/2017 which shows ejection fraction of 60%.  I plan to repeat echocardiogram in 2 to 3 months.  He increased dabrafenib 100 mg twice daily on 11/10/2017.  So far he has tolerated very well.  He went to the ER on 11/13/2017 with right occipital pain which improved with  injection.  He is taking Lyrica twice daily which is helping with the pain. -PET CT scan dated 12/19/2017 shows decrease in size of the lung nodules and SUV value. - I discussed the results of the PET scan dated 04/11/2018 which shows decrease in size of the innumerable scattered pulmonary nodules.  There is some increased conspicuity of a confluence of nodules in the left upper lobe with associated groundglass opacity.  This is a maximum SUV of 3 today, previously 1.9.  Given the configuration, this could be a focus of atypical bronchiolitis, he does have something of tree-in-bud nodularity.  No new findings of metastatic disease. - I have reviewed his echocardiogram dated 03/20/2018.  Ejection fraction is 55%.  This is down from 60% on 10/28/2017.  Baseline echocardiogram on 06/29/2017 shows EF of 60 to 65%. - I have reviewed his medications.  Trametinib can cause pneumonitis.  It also can cause decrease in ejection fraction. -I have recommended decreasing trametinib to 1 mg daily.  He will continue dabrafenib 100 mg twice daily. -I will reevaluate him in 4 weeks.  I plan to repeat another echocardiogram prior to next visit.  2.  Hypertension: He will continue metoprolol 100 mg twice daily and Norvasc 10 mg daily.  His blood pressure today is well controlled.    3.  CKD: - This has been stable with a creatinine around 1.62.      Orders placed this encounter:  Orders Placed This Encounter  Procedures  . Magnesium  . CBC with Differential/Platelet  . Comprehensive metabolic panel  . ECHOCARDIOGRAM COMPLETE      Derek Jack, [employer] [place] [phone]

## 2018-05-18 ENCOUNTER — Other Ambulatory Visit (HOSPITAL_COMMUNITY): Payer: Self-pay | Admitting: Hematology

## 2018-05-18 DIAGNOSIS — I10 Essential (primary) hypertension: Secondary | ICD-10-CM

## 2018-05-19 ENCOUNTER — Telehealth (HOSPITAL_COMMUNITY): Payer: Self-pay | Admitting: Pharmacist

## 2018-05-19 ENCOUNTER — Other Ambulatory Visit (HOSPITAL_COMMUNITY): Payer: Self-pay | Admitting: *Deleted

## 2018-05-19 ENCOUNTER — Telehealth (HOSPITAL_COMMUNITY): Payer: Self-pay | Admitting: Pharmacy Technician

## 2018-05-19 DIAGNOSIS — C3490 Malignant neoplasm of unspecified part of unspecified bronchus or lung: Secondary | ICD-10-CM

## 2018-05-19 MED ORDER — TRAMETINIB DIMETHYL SULFOXIDE 0.5 MG PO TABS: 1.0000 mg | ORAL_TABLET | Freq: Every day | ORAL | 0 refills | Status: DC

## 2018-05-19 MED ORDER — DABRAFENIB MESYLATE 50 MG PO CAPS: 100.0000 mg | ORAL_CAPSULE | Freq: Two times a day (BID) | ORAL | 0 refills | Status: DC

## 2018-05-19 NOTE — Telephone Encounter (Signed)
Oral Oncology Pharmacist Encounter   Patient is switching from manufacturer assistance to filling at [facility]. 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 Medication started 09/2017.  ECHO from 03/20/18 reviewed, EF stable compared to 10/28/17 ECHO. Prescription dose and frequency assessed.    Current medication list in Epic reviewed, several DDIs with dabrafenib identified. These are not new DDI for Mr. Goheen as dabrafenib is an existing therapy for him. - Dabrafenib may may decrease the serum concentration of amlodipine, escitalopram, glimepiride, isosorbide mononitrate, ondansetron, piogliazone. Monitor patient for decreased effectiveness of the listed medications. No current dose adjustment needed.  - Hyperglycemia-associated agents like  may diminish the therapeutic effect of antidiabetic agents like metformin and sitagliptin. Monitor blood glucose.   Oral Oncology Clinic will continue to follow patient.   Alyson N. Leonard, PharmD, BCPS, BCOP Hematology/Oncology Clinical Pharmacist [facility] Oral Chemotherapy [facility] [phone]  05/19/2018 10:34 AM  

## 2018-05-19 NOTE — Addendum Note (Signed)
Addended by: Darl Pikes on: 05/19/2018 03:17 PM   Modules accepted: Orders

## 2018-05-19 NOTE — Telephone Encounter (Signed)
Chart reviewed, refills for taflinar and mekinist sent to pharmacy.

## 2018-05-19 NOTE — Telephone Encounter (Addendum)
Oral Oncology Patient Advocate Encounter  1-Prior Authorization for Mekinist has been approved.    PA# [id number] Effective dates: 05/19/2018 through 05/17/2019  Patients co-pay is $427.66  2-Prior Authorization for [place] has been approved.    PA# [id number] Effective dates: 05/19/2018 through 05/17/2019  Patients co-pay is $2566.79  Oral Oncology Clinic will continue to follow.   [place] Patient [place] Phone [phone] Fax [phone] 05/19/2018 12:36 PM

## 2018-05-19 NOTE — Telephone Encounter (Signed)
Oral Oncology Patient Advocate Encounter  Received notification from [employer] that prior authorization for Mekinist is required.  PA submitted on CoverMyMeds [place]  Status is pending   Oral Oncology Patient Advocate Encounter  Received notification from [employer] that prior authorization for Tafinlar is required.  PA submitted on CoverMyMeds Key [id number] Status is pending  Oral Oncology Clinic will continue to follow.  [place] Patient [place] Phone [phone] Fax [phone] 05/19/2018 11:57 AM

## 2018-05-22 ENCOUNTER — Ambulatory Visit (HOSPITAL_COMMUNITY)
Admission: RE | Admit: 2018-05-22 | Discharge: 2018-05-22 | Disposition: A | Discharge status: Home / Self Care | Payer: Medicare Other | Source: Ambulatory Visit | Attending: Nurse Practitioner | Admitting: Nurse Practitioner

## 2018-05-22 ENCOUNTER — Inpatient Hospital Stay (HOSPITAL_COMMUNITY): Payer: Medicare Other | Attending: Hematology

## 2018-05-22 DIAGNOSIS — Z79899 Other long term (current) drug therapy: Secondary | ICD-10-CM | POA: Diagnosis not present

## 2018-05-22 DIAGNOSIS — Z7982 Long term (current) use of aspirin: Secondary | ICD-10-CM | POA: Insufficient documentation

## 2018-05-22 DIAGNOSIS — E119 Type 2 diabetes mellitus without complications: Secondary | ICD-10-CM | POA: Diagnosis not present

## 2018-05-22 DIAGNOSIS — Z7984 Long term (current) use of oral hypoglycemic drugs: Secondary | ICD-10-CM | POA: Insufficient documentation

## 2018-05-22 DIAGNOSIS — Z87891 Personal history of nicotine dependence: Secondary | ICD-10-CM | POA: Diagnosis not present

## 2018-05-22 DIAGNOSIS — I2510 Atherosclerotic heart disease of native coronary artery without angina pectoris: Secondary | ICD-10-CM | POA: Insufficient documentation

## 2018-05-22 DIAGNOSIS — N189 Chronic kidney disease, unspecified: Secondary | ICD-10-CM | POA: Insufficient documentation

## 2018-05-22 DIAGNOSIS — C3490 Malignant neoplasm of unspecified part of unspecified bronchus or lung: Secondary | ICD-10-CM | POA: Insufficient documentation

## 2018-05-22 DIAGNOSIS — I10 Essential (primary) hypertension: Secondary | ICD-10-CM | POA: Insufficient documentation

## 2018-05-22 LAB — COMPREHENSIVE METABOLIC PANEL
ALT: 12 U/L (ref 0–44)
ANION GAP: 6 (ref 5–15)
AST: 18 U/L (ref 15–41)
Albumin: 3.5 g/dL (ref 3.5–5.0)
Alkaline Phosphatase: 76 U/L (ref 38–126)
BUN: 19 mg/dL (ref 8–23)
CO2: 25 mmol/L (ref 22–32)
Calcium: 8.9 mg/dL (ref 8.9–10.3)
Chloride: 101 mmol/L (ref 98–111)
Creatinine, Ser: 1.58 mg/dL — ABNORMAL HIGH (ref 0.61–1.24)
GFR calc non Af Amer: 41 mL/min — ABNORMAL LOW (ref >60)
GFR, EST AFRICAN AMERICAN: 48 mL/min — AB (ref >60)
Glucose, Bld: 268 mg/dL — ABNORMAL HIGH (ref 70–99)
POTASSIUM: 4.7 mmol/L (ref 3.5–5.1)
SODIUM: 132 mmol/L — AB (ref 135–145)
Total Bilirubin: 0.7 mg/dL (ref 0.3–1.2)
Total Protein: 6.9 g/dL (ref 6.5–8.1)

## 2018-05-22 LAB — CBC WITH DIFFERENTIAL/PLATELET
Abs Immature Granulocytes: 0.02 10*3/uL (ref 0.00–0.07)
BASOS ABS: 0 10*3/uL (ref 0.0–0.1)
BASOS PCT: 0 %
EOS ABS: 0.1 10*3/uL (ref 0.0–0.5)
EOS PCT: 1 %
HCT: 39.1 % (ref 39.0–52.0)
Hemoglobin: 12.5 g/dL — ABNORMAL LOW (ref 13.0–17.0)
Immature Granulocytes: 0 %
Lymphocytes Relative: 24 %
Lymphs Abs: 1.4 10*3/uL (ref 0.7–4.0)
MCH: 26.2 pg (ref 26.0–34.0)
MCHC: 32 g/dL (ref 30.0–36.0)
MCV: 82 fL (ref 80.0–100.0)
Monocytes Absolute: 0.4 10*3/uL (ref 0.1–1.0)
Monocytes Relative: 7 %
NRBC: 0 % (ref 0.0–0.2)
Neutro Abs: 3.8 10*3/uL (ref 1.7–7.7)
Neutrophils Relative %: 68 %
PLATELETS: 163 10*3/uL (ref 150–400)
RBC: 4.77 MIL/uL (ref 4.22–5.81)
RDW: 15.9 % — AB (ref 11.5–15.5)
WBC: 5.7 10*3/uL (ref 4.0–10.5)

## 2018-05-22 LAB — MAGNESIUM: MAGNESIUM: 1.9 mg/dL (ref 1.7–2.4)

## 2018-05-22 NOTE — Progress Notes (Signed)
2D Echocardiogram has been performed.  [name] 05/22/2018, 11:03 AM

## 2018-05-23 ENCOUNTER — Inpatient Hospital Stay (HOSPITAL_COMMUNITY): Payer: Medicare Other | Admitting: Hematology

## 2018-05-23 ENCOUNTER — Encounter (HOSPITAL_COMMUNITY): Payer: Self-pay | Admitting: Hematology

## 2018-05-23 VITALS — BP 138/56 | HR 50 | Temp 97.6°F | Resp 18 | Wt 189.8 lb

## 2018-05-23 DIAGNOSIS — I129 Hypertensive chronic kidney disease with stage 1 through stage 4 chronic kidney disease, or unspecified chronic kidney disease: Secondary | ICD-10-CM

## 2018-05-23 DIAGNOSIS — Z87891 Personal history of nicotine dependence: Secondary | ICD-10-CM

## 2018-05-23 DIAGNOSIS — C3490 Malignant neoplasm of unspecified part of unspecified bronchus or lung: Secondary | ICD-10-CM | POA: Diagnosis not present

## 2018-05-23 DIAGNOSIS — E1122 Type 2 diabetes mellitus with diabetic chronic kidney disease: Secondary | ICD-10-CM | POA: Diagnosis not present

## 2018-05-23 DIAGNOSIS — Z7982 Long term (current) use of aspirin: Secondary | ICD-10-CM | POA: Diagnosis not present

## 2018-05-23 DIAGNOSIS — Z7984 Long term (current) use of oral hypoglycemic drugs: Secondary | ICD-10-CM | POA: Diagnosis not present

## 2018-05-23 DIAGNOSIS — I10 Essential (primary) hypertension: Secondary | ICD-10-CM | POA: Diagnosis not present

## 2018-05-23 DIAGNOSIS — E119 Type 2 diabetes mellitus without complications: Secondary | ICD-10-CM | POA: Diagnosis not present

## 2018-05-23 DIAGNOSIS — Z79899 Other long term (current) drug therapy: Secondary | ICD-10-CM | POA: Diagnosis not present

## 2018-05-23 DIAGNOSIS — N189 Chronic kidney disease, unspecified: Secondary | ICD-10-CM

## 2018-05-23 NOTE — Assessment & Plan Note (Signed)
1.  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  He has multiple bilateral lung nodules making him metastatic.  He had a colonoscopy which showed multiple polyps which were tubular adenomas.  His functional status is decent at this time.  He does not have any malignancy related symptoms at this time.   -Dabrafenib 100 mg twice daily and trametinib 1.5 mg once daily (response rates of 65% and median PFS of 10.9 months) on an empty stomach was started on 10/07/2017.  He is not having any problems.  Upon further questioning and confirmation, we found out that he is taking dabrafenib 50 mg twice daily.  2D echocardiogram dated 10/28/2017 which shows ejection fraction of 60%.  I plan to repeat echocardiogram in 2 to 3 months.  He increased dabrafenib 100 mg twice daily on 11/10/2017.  So far he has tolerated very well.  He went to the ER on 11/13/2017 with right occipital pain which improved with injection.  He is taking Lyrica twice daily which is helping with the pain. -PET CT scan dated 12/19/2017 shows decrease in size of the lung nodules and SUV value. - PET/CT scan on 04/11/2018 showed decrease in size of innumerable scattered pulmonary nodules.  There is some increased conspicuity of a confluence of nodules in the left upper lobe with associated groundglass opacity.  This is a maximum SUV of 3, previously 1.9.  Given the configuration, this could be a focus of atypical bronchiolitis, with no new findings of metastatic disease. -  Echocardiogram on 03/20/2018 shows EF of 55%.  Echo on 10/28/2017 shows EF of 60%.  Baseline echo on 06/29/2017 shows EF of 60 to 65%. -I reviewed echo on 05/22/2018 showing 55 to 60%. - He will continue dabrafenib 100 mg twice daily and trametinib 1 mg once daily. -His functional status  is good and he is able to do all his activities and activities around the house. -I will see him back in 4 weeks for follow-up.  I plan to repeat PET CT scan in 3 months from the last.  2.  Hypertension: -This is well controlled.  He will continue metoprolol 100 mg twice daily and Norvasc 10 mg daily.   3.  CKD: - This is stable with creatinine around 1.6. 

## 2018-05-23 NOTE — Patient Instructions (Signed)
[place] at [facility] Discharge Instructions  Follow up in 1 month with labs    Thank you for choosing [place] at [facility] to provide your oncology and hematology care.  To afford each patient quality time with our provider, please arrive at least 15 minutes before your scheduled appointment time.   If you have a lab appointment with the [facility] please come in thru the  Main Entrance and check in at the main information desk  You need to re-schedule your appointment should you arrive 10 or more minutes late.  We strive to give you quality time with our providers, and arriving late affects you and other patients whose appointments are after yours.  Also, if you no show three or more times for appointments you may be dismissed from the clinic at the providers discretion.     Again, thank you for choosing [facility].  Our hope is that these requests will decrease the amount of time that you wait before being seen by our physicians.       _____________________________________________________________  Should you have questions after your visit to [facility], please contact our office at [phone] between the hours of 8:00 a.m. and 4:30 p.m.  Voicemails left after 4:00 p.m. will not be returned until the following business day.  For prescription refill requests, have your pharmacy contact our office and allow 72 hours.    Cancer Center Support Programs:   > Cancer Support Group  2nd Tuesday of the month 1pm-2pm, Journey Room

## 2018-05-23 NOTE — Progress Notes (Signed)
[name], [name] [id number]   CLINIC:  Medical Oncology/Hematology  PCP:  [name], MD [address] [place] [address] [phone]   REASON FOR VISIT: Follow-up for metastatic non-small cell lung cancer  CURRENT THERAPY: Dabrafenib and Trametinib   INTERVAL HISTORY:  [name] 78 y.o. male returns for routine follow-up for metastatic non small cell lung cancer. [name] is here today with his wife. [name] is doing well with no complaints at this time. [name] does have occasional fatigue. Denies any nausea, vomiting, or diarrhea. Denies any new pains. Had not noticed any recent bleeding such as epistaxis, hematuria or hematochezia. Denies recent chest pain on exertion, shortness of breath on minimal exertion, pre-syncopal episodes, or palpitations. Denies any numbness or tingling in hands or feet. Denies any recent fevers, infections, or recent hospitalizations. [name] reports his appetite at 100% and energy level is at 75%.     REVIEW OF SYSTEMS:  Review of Systems  Constitutional: Positive for fatigue.  All other systems reviewed and are negative.    PAST MEDICAL/SURGICAL HISTORY:  Past Medical History:  Diagnosis Date  . Cancer ([place])   . Chronic kidney disease   . Coronary artery disease   . Diabetes mellitus   . Hypertension   . 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 Spontaneous pneumothorax 09/15/2010   right   Past Surgical History:  Procedure Laterality Date  . ANKLE FRACTURE SURGERY  2008   [facility]  . APPENDECTOMY  1960's  . CHEST TUBE INSERTION Right 09/15/2010   Dr Servando Snare - spontaneous PTX  . CHOLECYSTECTOMY  2008  . COLONOSCOPY N/A 09/07/2017   Procedure: COLONOSCOPY;  Surgeon: Daneil Dolin, MD;  Location: [facility] ENDO SU[facility];  Service: Endoscopy;  Laterality: N/A;  10:30am  . CORONARY ARTERY BYPASS GRAFT   2003  . EYE SURGERY  2001   Cataracts removed bilaterally  . INCISIONAL HERNIA REPAIR N/A 01/29/2015   Procedure: Fatima Blank HERNIORRHAPHY WITH MESH;  Surgeon: Aviva Signs, MD;  Location: [facility] ORS;  Service: General;  Laterality: N/A;  . INSERTION OF MESH N/A 01/29/2015   Procedure: INSERTION OF MESH;  Surgeon: Aviva Signs, MD;  Location: [facility] ORS;  Service: General;  Laterality: N/A;  . [place]  . LEFT HEART CATH AND CORS/GRAFTS ANGIOGRAPHY N/A 06/20/2017   Procedure: LEFT HEART CATH AND CORS/GRAFTS ANGIOGRAPHY;  Surgeon: Martinique, Peter M, MD;  Location: [place] CV LAB;  Service: Cardiovascular;  Laterality: N/A;  . ORIF ANKLE FRACTURE  08/26/2011   Procedure: OPEN REDUCTION INTERNAL FIXATION (ORIF) ANKLE FRACTURE;  Surgeon: Sanjuana Kava, MD;  Location: [facility] ORS;  Service: Orthopedics;  Laterality: Right;  . POLYPECTOMY  09/07/2017   Procedure: POLYPECTOMY;  Surgeon: Daneil Dolin, MD;  Location: [facility] END[facility];  Service: Endoscopy;;  ascending x2 (cold snare)  . PROSTATE SURGERY  2012   Enlarged prostate  . TRANSURETHRAL RESECTION OF PROSTATE  2012     SOCIAL HISTORY:  Social History   Socioeconomic History  . Marital status: Married    Spouse name: Not on file  . Number of children: Not on file  . Years of education: Not on file  . Highest education level: Not on file  Occupational History  . Not on file  Social Needs  .  Financial resource strain: Not on file  . Food insecurity:    Worry: Not on file    Inability: Not on file  . Transportation needs:    Medical: Not on file    Non-medical: Not on file  Tobacco Use  . Smoking status: Former Smoker    Packs/day: 1.00    Years: 33.00    Pack years: 33.00    Last attempt to quit: 05/17/1987    Years since quitting: 31.0  . Smokeless tobacco: Never Used  Substance and Sexual Activity  . Alcohol use: No  . Drug use: No  . Sexual activity: Yes  Lifestyle  . Physical activity:    Days  per week: Not on file    Minutes per session: Not on file  . Stress: Not on file  Relationships  . Social connections:    Talks on phone: Not on file    Gets together: Not on file    Attends religious service: Not on file    Active member of club or organization: Not on file    Attends meetings of clubs or organizations: Not on file    Relationship status: Not on file  . Intimate partner violence:    Fear of current or ex partner: Not on file    Emotionally abused: Not on file    Physically abused: Not on file    Forced sexual activity: Not on file  Other Topics Concern  . Not on file  Social History Narrative  . Not on file    FAMILY HISTORY:  Family History  Problem Relation Age of Onset  . Aneurysm Mother   . Heart attack Father   . Colon cancer Maternal Uncle   . Cancer Brother   . Gastric cancer Neg Hx   . Esophageal cancer Neg Hx     CURRENT MEDICATIONS:  Outpatient Encounter Medications as of 05/23/2018  Medication Sig  . amLODipine (NORVASC) 10 MG tablet Take 1 tablet (10 mg total) by mouth daily.  Marland Kitchen aspirin EC 81 MG tablet Take 81 mg by mouth daily.  . Cholecalciferol (VITAMIN D3) 5000 units CAPS Take 5,000 Units by mouth daily.  Marland Kitchen dabrafenib mesylate (TAFLINAR) 50 MG capsule Take 2 capsules (100 mg total) by mouth 2 (two) times daily. Take on an empty stomach 1 hour before or 2 hours after meals.  Marland Kitchen escitalopram (LEXAPRO) 10 MG tablet   . glimepiride (AMARYL) 2 MG tablet Take 2 mg by mouth daily with breakfast.   . isosorbide mononitrate (IMDUR) 30 MG 24 hr tablet   . metFORMIN (GLUCOPHAGE) 500 MG tablet Take 1 tablet (500 mg total) by mouth 2 (two) times daily with a meal.  . metoprolol (LOPRESSOR) 100 MG tablet Take 100 mg by mouth 2 (two) times daily.  . nitroGLYCERIN (NITROSTAT) 0.4 MG SL tablet Place 0.4 mg under the tongue every 5 (five) minutes as needed for chest pain.   Marland Kitchen ondansetron (ZOFRAN) 8 MG tablet Take 1 tablet (8 mg total) by mouth every 8  (eight) hours as needed for nausea or vomiting.  . ONE TOUCH ULTRA TEST test strip   . pioglitazone (ACTOS) 45 MG tablet Take 45 mg by mouth daily.  . prochlorperazine (COMPAZINE) 10 MG tablet Take 1 tablet (10 mg total) by mouth every 6 (six) hours as needed for nausea or vomiting.  . sitaGLIPtin (JANUVIA) 50 MG tablet Take 50 mg by mouth daily.   . trametinib dimethyl sulfoxide (MEKINIST) 0.5 MG tablet Take 2  tablets (1 mg total) by mouth daily. Take 1 hour before or 2 hours after a meal. Store refrigerated in original container.   No facility-administered encounter medications on file as of 05/23/2018.     ALLERGIES:  No Known Allergies   PHYSICAL EXAM:  ECOG Performance status: 1  Vitals:   05/23/18 0810  BP: (!) 138/56  Pulse: (!) 50  Resp: 18  Temp: 97.6 F (36.4 C)  SpO2: 99%   Filed Weights   05/23/18 0810  Weight: 189 lb 12.8 oz (86.1 kg)    Physical Exam Constitutional:      Appearance: Normal appearance. [name] is normal weight.  Cardiovascular:     Rate and Rhythm: Normal rate and regular rhythm.     Heart sounds: Normal heart sounds.  Pulmonary:     Effort: Pulmonary effort is normal.     Breath sounds: Normal breath sounds.  Musculoskeletal: Normal range of motion.  Skin:    General: Skin is warm and dry.  Neurological:     Mental Status: [name] is alert and oriented to person, place, and time. Mental status is at baseline.  Psychiatric:        Mood and Affect: Mood normal.        Behavior: Behavior normal.        Thought Content: Thought content normal.        Judgment: Judgment normal.      LABORATORY DATA:  I have reviewed the labs as listed.  CBC    Component Value Date/Time   WBC 5.7 05/22/2018 1010   RBC 4.77 05/22/2018 1010   HGB 12.5 (L) 05/22/2018 1010   HCT 39.1 05/22/2018 1010   PLT 163 05/22/2018 1010   MCV 82.0 05/22/2018 1010   MCH 26.2 05/22/2018 1010   MCHC 32.0 05/22/2018 1010   RDW 15.9 (H) 05/22/2018 1010   LYMPHSABS 1.4  05/22/2018 1010   MONOABS 0.4 05/22/2018 1010   EOSABS 0.1 05/22/2018 1010   BASOSABS 0.0 05/22/2018 1010   CMP Latest Ref Rng & Units 05/22/2018 04/18/2018 02/21/2018  Glucose 70 - 99 mg/dL 268(H) 308(H) 155(H)  BUN 8 - 23 mg/dL '19 18 22  ' Creatinine 0.61 - 1.24 mg/dL 1.58(H) 1.53(H) 1.62(H)  Sodium 135 - 145 mmol/L 132(L) 129(L) 131(L)  Potassium 3.5 - 5.1 mmol/L 4.7 4.3 4.7  Chloride 98 - 111 mmol/L 101 97(L) 98  CO2 22 - 32 mmol/L '25 25 27  ' Calcium 8.9 - 10.3 mg/dL 8.9 8.8(L) 8.9  Total Protein 6.5 - 8.1 g/dL 6.9 7.3 7.3  Total Bilirubin 0.3 - 1.2 mg/dL 0.7 0.5 0.7  Alkaline Phos 38 - 126 U/L 76 92 102  AST 15 - 41 U/L '18 17 24  ' ALT 0 - 44 U/L '12 13 16       ' DIAGNOSTIC IMAGING:  I have independently reviewed the scans and discussed with the patient.   I have reviewed Francene Finders, NP's note and agree with the documentation.  I personally performed a face-to-face visit, made revisions and my assessment and plan is as follows.    ASSESSMENT & PLAN:   Adenocarcinoma of lung, stage 4 ([place]) 1.  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  [name] has multiple bilateral lung nodules making him metastatic.  [name] had a colonoscopy which showed multiple polyps which were tubular adenomas.  His functional status is decent at this time.  [name] does not have any malignancy related symptoms at this time.   -Dabrafenib 100 mg twice daily and trametinib 1.5 mg once daily (response rates of 65% and median PFS of 10.9 months) on an empty stomach was started on 10/07/2017.  [name] is not having any problems.  Upon further questioning and confirmation, we found out that [name] is taking dabrafenib 50 mg twice daily.  2D echocardiogram dated 10/28/2017 which shows ejection fraction of 60%.  I plan to repeat  echocardiogram in 2 to 3 months.  [name] increased dabrafenib 100 mg twice daily on 11/10/2017.  So far [name] has tolerated very well.  [name] went to the ER on 11/13/2017 with right occipital pain which improved with injection.  [name] is taking Lyrica twice daily which is helping with the pain. -PET CT scan dated 12/19/2017 shows decrease in size of the lung nodules and SUV value. - PET/CT scan on 04/11/2018 showed decrease in size of innumerable scattered pulmonary nodules.  There is some increased conspicuity of a confluence of nodules in the left upper lobe with associated groundglass opacity.  This is a maximum SUV of 3, previously 1.9.  Given the configuration, this could be a focus of atypical bronchiolitis, with no new findings of metastatic disease. -  Echocardiogram on 03/20/2018 shows EF of 55%.  Echo on 10/28/2017 shows EF of 60%.  Baseline echo on 06/29/2017 shows EF of 60 to 65%. -I reviewed echo on 05/22/2018 showing 55 to 60%. - [name] will continue dabrafenib 100 mg twice daily and trametinib 1 mg once daily. -His functional status is good and [name] is able to do all his activities and activities around the house. -I will see him back in 4 weeks for follow-up.  I plan to repeat PET CT scan in 3 months from the last.  2.  Hypertension: -This is well controlled.  [name] will continue metoprolol 100 mg twice daily and Norvasc 10 mg daily.   3.  CKD: - This is stable with creatinine around 1.6.      Orders placed this encounter:  Orders Placed This Encounter  Procedures  . Magnesium  . CBC with Differential/Platelet  . Comprehensive metabolic panel      Derek Jack, MD [place] [phone]

## 2018-06-02 ENCOUNTER — Telehealth (HOSPITAL_COMMUNITY): Payer: Self-pay | Admitting: Pharmacy Technician

## 2018-06-02 NOTE — Telephone Encounter (Signed)
Spoke to patients wife.  I set his medications to be filled at [facility] on 1/23 for delivery 1/24.

## 2018-06-02 NOTE — Telephone Encounter (Signed)
Oral Oncology Patient Advocate Encounter   Was successful in securing patient an $69 grant from [facility] [place] [facility]) to provide copayment coverage for [place].  This will keep the out of pocket expense at $0.     I have spoken with the patient.    The billing information is as follows and has been shared with [place].   Member ID: [id number] Group ID: [id number] RxBin: [id number] Dates of Eligibility: 02/18/18 through 05/19/2019  [place] Patient [place] Phone [phone] Fax [phone] 06/02/2018 2:57 PM

## 2018-06-05 ENCOUNTER — Telehealth (HOSPITAL_COMMUNITY): Payer: Self-pay | Admitting: Pharmacist

## 2018-06-05 NOTE — Telephone Encounter (Signed)
Oral Chemotherapy Pharmacist Encounter    Spoke with [name] to review dosing, administration, and side effects for Mekinist/Tafinlar. She reported no new side effects or concerns since they were last seen in the office.   Darl Pikes, PharmD, BCPS, [facility] Hematology/Oncology Clinical Pharmacist [facility] [phone]  06/05/2018 3:52 PM

## 2018-06-08 MED FILL — MEKINIST 0.5 MG TABLET: 0.5 | 30 days supply | Qty: 60 | Fill #0

## 2018-06-08 MED FILL — TAFINLAR 50 MG CAPS: 50 | 30 days supply | Qty: 120 | Fill #0

## 2018-06-16 ENCOUNTER — Encounter: Payer: Self-pay | Admitting: Cardiovascular Disease

## 2018-06-16 ENCOUNTER — Ambulatory Visit: Payer: Medicare Other | Admitting: Cardiovascular Disease

## 2018-06-16 VITALS — BP 124/58 | HR 52 | Ht 71.0 in | Wt 189.0 lb

## 2018-06-16 DIAGNOSIS — I25708 Atherosclerosis of coronary artery bypass graft(s), unspecified, with other forms of angina pectoris: Secondary | ICD-10-CM | POA: Diagnosis not present

## 2018-06-16 DIAGNOSIS — C3490 Malignant neoplasm of unspecified part of unspecified bronchus or lung: Secondary | ICD-10-CM | POA: Diagnosis not present

## 2018-06-16 DIAGNOSIS — N183 Chronic kidney disease, stage 3 unspecified: Secondary | ICD-10-CM

## 2018-06-16 DIAGNOSIS — I10 Essential (primary) hypertension: Secondary | ICD-10-CM | POA: Diagnosis not present

## 2018-06-16 NOTE — Progress Notes (Signed)
SUBJECTIVE: The patient presents for routine follow up. He underwent coronary angiography in February 2019 which demonstratedsevere native three-vessel obstructive coronary disease, patent LIMA to the LAD, and severe SVG disease with a 99% proximal SVG stenosis to the diagonal with TIMI I flow and multiple high-grade lesions in the SVG to the OM and SVG to the RCA. Dr. Martinique recommended consideration for redo CABG. He was evaluated by Dr. 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  Echocardiogram on 10/28/2017 showed normal left ventricular systolic function and regional wall motion, LVEF 60%.  There was mild mitral and tricuspid regurgitation with mildly reduced right ventricular systolic function, pulmonary pressures 41 mmHg.  He has been doing well.  He has not had to use nitroglycerin since his last visit with me.  He denies chest pain.  Chronic exertional dyspnea is stable.  He told me statins in the past led to diarrhea.  His PCP tried several different agents.  Social history: He enjoys working with his hands and working in his shop in his backyard. 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 He has 7thgrade education because he had a dropout as his father got sick and he had to take care of the farm. He used to read his sisters'books and study.   Review of Systems: As per "subjective", otherwise negative.  No Known Allergies  Current Outpatient Medications  Medication Sig Dispense Refill  . amLODipine (NORVASC) 10 MG tablet Take 1 tablet (10 mg total) by mouth daily. 30 tablet 3  . aspirin EC 81 MG tablet Take 81 mg by mouth daily.    . Cholecalciferol (VITAMIN D3) 5000 units CAPS Take 5,000 Units by mouth daily.    Marland Kitchen dabrafenib mesylate (TAFLINAR) 50 MG capsule Take 2 capsules (100 mg total) by mouth 2 (two) times  daily. Take on an empty stomach 1 hour before or 2 hours after meals. 120 capsule 0  . escitalopram (LEXAPRO) 10 MG tablet     . glimepiride (AMARYL) 2 MG tablet Take 2 mg by mouth daily with breakfast.     . isosorbide mononitrate (IMDUR) 30 MG 24 hr tablet     . metFORMIN (GLUCOPHAGE) 500 MG tablet Take 1 tablet (500 mg total) by mouth 2 (two) times daily with a meal.    . metoprolol (LOPRESSOR) 100 MG tablet Take 100 mg by mouth 2 (two) times daily.    . nitroGLYCERIN (NITROSTAT) 0.4 MG SL tablet Place 0.4 mg under the tongue every 5 (five) minutes as needed for chest pain.     Marland Kitchen ondansetron (ZOFRAN) 8 MG tablet Take 1 tablet (8 mg total) by mouth every 8 (eight) hours as needed for nausea or vomiting. 30 tablet 2  . ONE TOUCH ULTRA TEST test strip     . pioglitazone (ACTOS) 45 MG tablet Take 45 mg by mouth daily.    . prochlorperazine (COMPAZINE) 10 MG tablet Take 1 tablet (10 mg total) by mouth every 6 (six) hours as needed for nausea or vomiting. 30 tablet 2  . sitaGLIPtin (JANUVIA) 50 MG tablet Take 50 mg by mouth daily.     . trametinib dimethyl sulfoxide (MEKINIST) 0.5 MG tablet Take 2 tablets (1 mg total) by mouth daily. Take 1 hour before or 2 hours after a meal. Store refrigerated in original container.  60 tablet 0   No current facility-administered medications for this visit.     Past Medical History:  Diagnosis Date  . Cancer ([place])   . Chronic kidney disease   . Coronary artery disease   . Diabetes mellitus   . Hypertension   . 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 Spontaneous pneumothorax 09/15/2010   right    Past Surgical History:  Procedure Laterality Date  . ANKLE FRACTURE SURGERY  2008   [facility]  . APPENDECTOMY  1960's  . CHEST TUBE INSERTION Right 09/15/2010   Dr Servando Snare - spontaneous PTX  . CHOLECYSTECTOMY  2008  . COLONOSCOPY N/A 09/07/2017    Procedure: COLONOSCOPY;  Surgeon: Daneil Dolin, MD;  Location: [facility];  Service: Endoscopy;  Laterality: N/A;  10:30am  . CORONARY ARTERY BYPASS GRAFT  2003  . EYE SURGERY  2001   Cataracts removed bilaterally  . INCISIONAL HERNIA REPAIR N/A 01/29/2015   Procedure: Fatima Blank HERNIORRHAPHY WITH MESH;  Surgeon: Aviva Signs, MD;  Location: [facility] ORS;  Service: General;  Laterality: N/A;  . INSERTION OF MESH N/A 01/29/2015   Procedure: INSERTION OF MESH;  Surgeon: Aviva Signs, MD;  Location: [facility] ORS;  Service: General;  Laterality: N/A;  . [place]  . LEFT HEART CATH AND CORS/GRAFTS ANGIOGRAPHY N/A 06/20/2017   Procedure: LEFT HEART CATH AND CORS/GRAFTS ANGIOGRAPHY;  Surgeon: Martinique, Peter M, MD;  Location: [place] CV LAB;  Service: Cardiovascular;  Laterality: N/A;  . ORIF ANKLE FRACTURE  08/26/2011   Procedure: OPEN REDUCTION INTERNAL FIXATION (ORIF) ANKLE FRACTURE;  Surgeon: Sanjuana Kava, MD;  Location: [facility] ORS;  Service: Orthopedics;  Laterality: Right;  . POLYPECTOMY  09/07/2017   Procedure: POLYPECTOMY;  Surgeon: Daneil Dolin, MD;  Location: [facility];  Service: Endoscopy;;  ascending x2 (cold snare)  . PROSTATE SURGERY  2012   Enlarged prostate  . TRANSURETHRAL RESECTION OF PROSTATE  2012    Social History   Socioeconomic History  . Marital status: Married    Spouse name: Not on file  . Number of children: Not on file  . Years of education: Not on file  . Highest education level: Not on file  Occupational History  . Not on file  Social Needs  . Financial resource strain: Not on file  . Food insecurity:    Worry: Not on file    Inability: Not on file  . Transportation needs:    Medical: Not on file    Non-medical: Not on file  Tobacco Use  . Smoking status: Former Smoker    Packs/day: 1.00    Years: 33.00    Pack years: 33.00    Last attempt to quit: 05/17/1987    Years since quitting: 31.1  . Smokeless tobacco: Never  Used  Substance and Sexual Activity  . Alcohol use: No  . Drug use: No  . Sexual activity: Yes  Lifestyle  . Physical activity:    Days per week: Not on file    Minutes per session: Not on file  . Stress: Not on file  Relationships  . Social connections:    Talks on phone: Not on file    Gets together: Not on file    Attends religious service: Not on file    Active member of club  or organization: Not on file    Attends meetings of clubs or organizations: Not on file    Relationship status: Not on file  . Intimate partner violence:    Fear of current or ex partner: Not on file    Emotionally abused: Not on file    Physically abused: Not on file    Forced sexual activity: Not on file  Other Topics Concern  . Not on file  Social History Narrative  . Not on file     Vitals:   06/16/18 1331  BP: (!) 124/58  Pulse: (!) 52  SpO2: 97%  Weight: 189 lb (85.7 kg)  Height: 5\' 11"  (1.803 m)    Wt Readings from Last 3 Encounters:  06/16/18 189 lb (85.7 kg)  05/23/18 189 lb 12.8 oz (86.1 kg)  04/20/18 186 lb 4.8 oz (84.5 kg)     PHYSICAL EXAM General: NAD HEENT: Normal. Neck: No JVD, no thyromegaly. Lungs: Diminished air movement throughout with bilateral faint inspiratory and expiratory wheezes. No crackles. CV: Regular rate and rhythm, normal S1/S2, no S3/S4, no murmur. No pretibial or periankle edema.  No carotid bruit.   Abdomen: Soft, nontender, no distention.  Neurologic: Alert and oriented.  Psych: Normal affect. Skin: Normal. Musculoskeletal: No gross deformities.    ECG: Reviewed above under Subjective   Labs: Lab Results  Component Value Date/Time   K 4.7 05/22/2018 10:10 AM   BUN 19 05/22/2018 10:10 AM   CREATININE 1.58 (H) 05/22/2018 10:10 AM   ALT 12 05/22/2018 10:10 AM   HGB 12.5 (L) 05/22/2018 10:10 AM     Lipids: No results found for: LDLCALC, LDLDIRECT, CHOL, TRIG, HDL     ASSESSMENT AND PLAN:  1. Coronary artery disease with history  of four-vessel CABG and severe SVG disease with concomitant angina pectoris:Symptomatically stable. Continue aspirin, Imdur 30 mg,and metoprolol. Dr. Martinique recommended redo CABGbut is deemed not to be an operative candidate given his metastatic adenocarcinoma of the lung. He will be medically managed. He is statin intolerant.  I will obtain a copy of lipids from PCP.  2. Hypertension: Blood pressure is normal. He developed hyperkalemia with ACE inhibitors in the past.His oncologist previously added amlodipine 10 mg daily as both dabrafeniband trametinib both cause hypertension.  3. Chronic kidney disease stage III: BUN 19, creatinine 1.58on 05/22/2018. This has remained stable.  4. Stage IV metastatic adenocarcinoma of the lung: Follows with oncology.   Disposition: Follow up 4 months   Kate Sable, M.D., F.A.C.C.

## 2018-06-16 NOTE — Patient Instructions (Signed)
Medication Instructions:  Your physician recommends that you continue on your current medications as directed. Please refer to the Current Medication list given to you today.  If you need a refill on your cardiac medications before your next appointment, please call your pharmacy.   Lab work: None If you have labs (blood work) drawn today and your tests are completely normal, you will receive your results only by: Marland Kitchen MyChart Message (if you have MyChart) OR . A paper copy in the mail If you have any lab test that is abnormal or we need to change your treatment, we will call you to review the results.  Testing/Procedures: None   Follow-Up: At [facility], you and your health needs are our priority.  As part of our continuing mission to provide you with exceptional heart care, we have created designated Provider Care Teams.  These Care Teams include your primary Cardiologist (physician) and Advanced Practice Providers (APPs -  Physician Assistants and Nurse Practitioners) who all work together to provide you with the care you need, when you need it. You will need a follow up appointment in 4 months.  Please call our office 2 months in advance to schedule this appointment.  You may see Kate Sable, MD or one of the following Advanced Practice Providers on your designated Care Team:   Bernerd Pho, PA-C [facility]) . Ermalinda Barrios, PA-C ([place])  Any Other Special Instructions Will Be Listed Below (If Applicable). None

## 2018-06-23 ENCOUNTER — Inpatient Hospital Stay (HOSPITAL_COMMUNITY): Payer: Medicare Other | Attending: Hematology

## 2018-06-23 DIAGNOSIS — Z794 Long term (current) use of insulin: Secondary | ICD-10-CM | POA: Insufficient documentation

## 2018-06-23 DIAGNOSIS — C3490 Malignant neoplasm of unspecified part of unspecified bronchus or lung: Secondary | ICD-10-CM | POA: Diagnosis not present

## 2018-06-23 DIAGNOSIS — Z79899 Other long term (current) drug therapy: Secondary | ICD-10-CM | POA: Insufficient documentation

## 2018-06-23 DIAGNOSIS — N189 Chronic kidney disease, unspecified: Secondary | ICD-10-CM | POA: Insufficient documentation

## 2018-06-23 DIAGNOSIS — I129 Hypertensive chronic kidney disease with stage 1 through stage 4 chronic kidney disease, or unspecified chronic kidney disease: Secondary | ICD-10-CM | POA: Insufficient documentation

## 2018-06-23 DIAGNOSIS — Z87891 Personal history of nicotine dependence: Secondary | ICD-10-CM | POA: Diagnosis not present

## 2018-06-23 DIAGNOSIS — R05 Cough: Secondary | ICD-10-CM | POA: Insufficient documentation

## 2018-06-23 DIAGNOSIS — E1122 Type 2 diabetes mellitus with diabetic chronic kidney disease: Secondary | ICD-10-CM | POA: Diagnosis not present

## 2018-06-23 LAB — COMPREHENSIVE METABOLIC PANEL
ALT: 13 U/L (ref 0–44)
AST: 18 U/L (ref 15–41)
Albumin: 3.4 g/dL — ABNORMAL LOW (ref 3.5–5.0)
Alkaline Phosphatase: 80 U/L (ref 38–126)
Anion gap: 6 (ref 5–15)
BILIRUBIN TOTAL: 0.5 mg/dL (ref 0.3–1.2)
BUN: 24 mg/dL — AB (ref 8–23)
CO2: 25 mmol/L (ref 22–32)
Calcium: 8.6 mg/dL — ABNORMAL LOW (ref 8.9–10.3)
Chloride: 101 mmol/L (ref 98–111)
Creatinine, Ser: 1.5 mg/dL — ABNORMAL HIGH (ref 0.61–1.24)
GFR calc Af Amer: 51 mL/min — ABNORMAL LOW (ref >60)
GFR, EST NON AFRICAN AMERICAN: 44 mL/min — AB (ref >60)
Glucose, Bld: 193 mg/dL — ABNORMAL HIGH (ref 70–99)
POTASSIUM: 4.9 mmol/L (ref 3.5–5.1)
Sodium: 132 mmol/L — ABNORMAL LOW (ref 135–145)
Total Protein: 7 g/dL (ref 6.5–8.1)

## 2018-06-23 LAB — CBC WITH DIFFERENTIAL/PLATELET
Abs Immature Granulocytes: 0.02 10*3/uL (ref 0.00–0.07)
Basophils Absolute: 0 10*3/uL (ref 0.0–0.1)
Basophils Relative: 0 %
Eosinophils Absolute: 0 10*3/uL (ref 0.0–0.5)
Eosinophils Relative: 1 %
HCT: 39.8 % (ref 39.0–52.0)
Hemoglobin: 12.7 g/dL — ABNORMAL LOW (ref 13.0–17.0)
Immature Granulocytes: 0 %
Lymphocytes Relative: 22 %
Lymphs Abs: 1.2 10*3/uL (ref 0.7–4.0)
MCH: 26.2 pg (ref 26.0–34.0)
MCHC: 31.9 g/dL (ref 30.0–36.0)
MCV: 82.2 fL (ref 80.0–100.0)
MONOS PCT: 10 %
Monocytes Absolute: 0.5 10*3/uL (ref 0.1–1.0)
NEUTROS PCT: 67 %
Neutro Abs: 3.7 10*3/uL (ref 1.7–7.7)
Platelets: 167 10*3/uL (ref 150–400)
RBC: 4.84 MIL/uL (ref 4.22–5.81)
RDW: 15.1 % (ref 11.5–15.5)
WBC: 5.5 10*3/uL (ref 4.0–10.5)
nRBC: 0 % (ref 0.0–0.2)

## 2018-06-23 LAB — MAGNESIUM: Magnesium: 1.9 mg/dL (ref 1.7–2.4)

## 2018-06-26 ENCOUNTER — Encounter (HOSPITAL_COMMUNITY): Payer: Self-pay | Admitting: Hematology

## 2018-06-26 ENCOUNTER — Inpatient Hospital Stay (HOSPITAL_COMMUNITY): Payer: Medicare Other | Admitting: Hematology

## 2018-06-26 ENCOUNTER — Other Ambulatory Visit: Payer: Self-pay

## 2018-06-26 VITALS — BP 169/77 | HR 51 | Temp 97.6°F | Resp 18 | Wt 187.6 lb

## 2018-06-26 DIAGNOSIS — R05 Cough: Secondary | ICD-10-CM

## 2018-06-26 DIAGNOSIS — C3490 Malignant neoplasm of unspecified part of unspecified bronchus or lung: Secondary | ICD-10-CM | POA: Diagnosis not present

## 2018-06-26 DIAGNOSIS — E1122 Type 2 diabetes mellitus with diabetic chronic kidney disease: Secondary | ICD-10-CM | POA: Diagnosis not present

## 2018-06-26 DIAGNOSIS — Z87891 Personal history of nicotine dependence: Secondary | ICD-10-CM

## 2018-06-26 DIAGNOSIS — I129 Hypertensive chronic kidney disease with stage 1 through stage 4 chronic kidney disease, or unspecified chronic kidney disease: Secondary | ICD-10-CM

## 2018-06-26 DIAGNOSIS — Z794 Long term (current) use of insulin: Secondary | ICD-10-CM | POA: Diagnosis not present

## 2018-06-26 DIAGNOSIS — Z79899 Other long term (current) drug therapy: Secondary | ICD-10-CM

## 2018-06-26 DIAGNOSIS — N189 Chronic kidney disease, unspecified: Secondary | ICD-10-CM | POA: Diagnosis not present

## 2018-06-26 NOTE — Progress Notes (Signed)
[name], [place] [address]   CLINIC:  Medical Oncology/Hematology  PCP:  [name], MD [address] [place] [address] [phone]   REASON FOR VISIT: Follow-up for metastatic non-small cell lung cancer  CURRENT THERAPY:Dabrafenib and Trametinib   INTERVAL HISTORY:  [name] 79 y.o. male returns for routine follow-up metastatic non-small cell lung cancer. He is here today with his wife. He report he has had a cough and cold for over a week now. It is slowly getting better but still has the cough. He did not need to take any antibiotics. Denies any nausea, vomiting, or diarrhea. Denies any new pains. Had not noticed any recent bleeding such as epistaxis, hematuria or hematochezia. Denies recent chest pain on exertion, shortness of breath on minimal exertion, pre-syncopal episodes, or palpitations. Denies any numbness or tingling in hands or feet. Denies any recent fevers, infections, or recent hospitalizations. Patient reports appetite at 100% and energy level at 100%. He has no problem maintaining his weight.    REVIEW OF SYSTEMS:  Review of Systems  Respiratory: Positive for cough.   All other systems reviewed and are negative.    PAST MEDICAL/SURGICAL HISTORY:  Past Medical History:  Diagnosis Date  . Cancer ([place])   . Chronic kidney disease   . Coronary artery disease   . Diabetes mellitus   . Hypertension   . 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 Spontaneous pneumothorax 09/15/2010   right   Past Surgical History:  Procedure Laterality Date  . ANKLE FRACTURE SURGERY  2008   [facility]  . APPENDECTOMY  1960's  . CHEST TUBE INSERTION Right 09/15/2010   Dr Servando Snare - spontaneous PTX  . CHOLECYSTECTOMY  2008  . COLONOSCOPY N/A 09/07/2017   Procedure: COLONOSCOPY;  Surgeon: Daneil Dolin, MD;   Location: [facility];  Service: Endoscopy;  Laterality: N/A;  10:30am  . CORONARY ARTERY BYPASS GRAFT  2003  . EYE SURGERY  2001   Cataracts removed bilaterally  . INCISIONAL HERNIA REPAIR N/A 01/29/2015   Procedure: Fatima Blank HERNIORRHAPHY WITH MESH;  Surgeon: Aviva Signs, MD;  Location: [facility] ORS;  Service: General;  Laterality: N/A;  . INSERTION OF MESH N/A 01/29/2015   Procedure: INSERTION OF MESH;  Surgeon: Aviva Signs, MD;  Location: [facility] ORS;  Service: General;  Laterality: N/A;  . [place]  . LEFT HEART CATH AND CORS/GRAFTS ANGIOGRAPHY N/A 06/20/2017   Procedure: LEFT HEART CATH AND CORS/GRAFTS ANGIOGRAPHY;  Surgeon: Martinique, Peter M, MD;  Location: [place] [facility];  Service: Cardiovascular;  Laterality: N/A;  . ORIF ANKLE FRACTURE  08/26/2011   Procedure: OPEN REDUCTION INTERNAL FIXATION (ORIF) ANKLE FRACTURE;  Surgeon: Sanjuana Kava, MD;  Location: [facility] OR[facility];  Service: Orthopedics;  Laterality: Right;  . POLYPECTOMY  09/07/2017   Procedure: POLYPECTOMY;  Surgeon: Daneil Dolin, MD;  Location: [facility];  Service: Endoscopy;;  ascending x2 (cold snare)  . PROSTATE SURGERY  2012   Enlarged prostate  . TRANSURETHRAL RESECTION OF PROSTATE  2012     SOCIAL HISTORY:  Social History   Socioeconomic History  . Marital status: Married    Spouse name: Not on file  . Number of children: Not on file  . Years of education: Not on file  .  Highest education level: Not on file  Occupational History  . Not on file  Social Needs  . Financial resource strain: Not on file  . Food insecurity:    Worry: Not on file    Inability: Not on file  . Transportation needs:    Medical: Not on file    Non-medical: Not on file  Tobacco Use  . Smoking status: Former Smoker    Packs/day: 1.00    Years: 33.00    Pack years: 33.00    Last attempt to quit: 05/17/1987    Years since quitting: 31.1  . Smokeless tobacco: Never Used  Substance and Sexual Activity    . Alcohol use: No  . Drug use: No  . Sexual activity: Yes  Lifestyle  . Physical activity:    Days per week: Not on file    Minutes per session: Not on file  . Stress: Not on file  Relationships  . Social connections:    Talks on phone: Not on file    Gets together: Not on file    Attends religious service: Not on file    Active member of club or organization: Not on file    Attends meetings of clubs or organizations: Not on file    Relationship status: Not on file  . Intimate partner violence:    Fear of current or ex partner: Not on file    Emotionally abused: Not on file    Physically abused: Not on file    Forced sexual activity: Not on file  Other Topics Concern  . Not on file  Social History Narrative  . Not on file    FAMILY HISTORY:  Family History  Problem Relation Age of Onset  . Aneurysm Mother   . Heart attack Father   . Colon cancer Maternal Uncle   . Cancer Brother   . Gastric cancer Neg Hx   . Esophageal cancer Neg Hx     CURRENT MEDICATIONS:  Outpatient Encounter Medications as of 06/26/2018  Medication Sig  . amLODipine (NORVASC) 10 MG tablet Take 1 tablet (10 mg total) by mouth daily.  Marland Kitchen aspirin EC 81 MG tablet Take 81 mg by mouth daily.  . Cholecalciferol (VITAMIN D3) 5000 units CAPS Take 5,000 Units by mouth daily.  Marland Kitchen dabrafenib mesylate (TAFLINAR) 50 MG capsule Take 2 capsules (100 mg total) by mouth 2 (two) times daily. Take on an empty stomach 1 hour before or 2 hours after meals.  Marland Kitchen escitalopram (LEXAPRO) 10 MG tablet   . glimepiride (AMARYL) 2 MG tablet Take 2 mg by mouth daily with breakfast.   . isosorbide mononitrate (IMDUR) 30 MG 24 hr tablet   . metFORMIN (GLUCOPHAGE) 500 MG tablet Take 1 tablet (500 mg total) by mouth 2 (two) times daily with a meal.  . metoprolol (LOPRESSOR) 100 MG tablet Take 100 mg by mouth 2 (two) times daily.  . nitroGLYCERIN (NITROSTAT) 0.4 MG SL tablet Place 0.4 mg under the tongue every 5 (five) minutes as  needed for chest pain.   Marland Kitchen ondansetron (ZOFRAN) 8 MG tablet Take 1 tablet (8 mg total) by mouth every 8 (eight) hours as needed for nausea or vomiting.  . ONE TOUCH ULTRA TEST test strip   . pioglitazone (ACTOS) 45 MG tablet Take 45 mg by mouth daily.  . prochlorperazine (COMPAZINE) 10 MG tablet Take 1 tablet (10 mg total) by mouth every 6 (six) hours as needed for nausea or vomiting.  . sitaGLIPtin (JANUVIA) 50  MG tablet Take 50 mg by mouth daily.   . trametinib dimethyl sulfoxide (MEKINIST) 0.5 MG tablet Take 2 tablets (1 mg total) by mouth daily. Take 1 hour before or 2 hours after a meal. Store refrigerated in original container.   No facility-administered encounter medications on file as of 06/26/2018.     ALLERGIES:  No Known Allergies   PHYSICAL EXAM:  ECOG Performance status: 1  Vitals:   06/26/18 0900  BP: (!) 169/77  Pulse: (!) 51  Resp: 18  Temp: 97.6 F (36.4 C)  SpO2: 97%   Filed Weights   06/26/18 0900  Weight: 187 lb 9 oz (85.1 kg)    Physical Exam Constitutional:      Appearance: Normal appearance. He is normal weight.  Cardiovascular:     Rate and Rhythm: Normal rate and regular rhythm.     Heart sounds: Normal heart sounds.  Pulmonary:     Effort: Pulmonary effort is normal.     Breath sounds: Normal breath sounds.  Musculoskeletal: Normal range of motion.  Skin:    General: Skin is warm and dry.  Neurological:     Mental Status: He is alert and oriented to person, place, and time. Mental status is at baseline.  Psychiatric:        Mood and Affect: Mood normal.        Behavior: Behavior normal.        Thought Content: Thought content normal.        Judgment: Judgment normal.   Chest is bilateral clear to auscultation. Abdomen: Soft nontender with no palpable organomegaly. Extremities: Trace edema bilaterally.   LABORATORY DATA:  I have reviewed the labs as listed.  CBC    Component Value Date/Time   WBC 5.5 06/23/2018 1040   RBC 4.84  06/23/2018 1040   HGB 12.7 (L) 06/23/2018 1040   HCT 39.8 06/23/2018 1040   PLT 167 06/23/2018 1040   MCV 82.2 06/23/2018 1040   MCH 26.2 06/23/2018 1040   MCHC 31.9 06/23/2018 1040   RDW 15.1 06/23/2018 1040   LYMPHSABS 1.2 06/23/2018 1040   MONOABS 0.5 06/23/2018 1040   EOSABS 0.0 06/23/2018 1040   BASOSABS 0.0 06/23/2018 1040   CMP Latest Ref Rng & Units 06/23/2018 05/22/2018 04/18/2018  Glucose 70 - 99 mg/dL 193(H) 268(H) 308(H)  BUN 8 - 23 mg/dL 24(H) 19 18  Creatinine 0.61 - 1.24 mg/dL 1.50(H) 1.58(H) 1.53(H)  Sodium 135 - 145 mmol/L 132(L) 132(L) 129(L)  Potassium 3.5 - 5.1 mmol/L 4.9 4.7 4.3  Chloride 98 - 111 mmol/L 101 101 97(L)  CO2 22 - 32 mmol/L _0 Calcium 8.9 - 10.3 mg/dL 8.6(L) 8.9 8.8(L)  Total Protein 6.5 - 8.1 g/dL 7.0 6.9 7.3  Total Bilirubin 0.3 - 1.2 mg/dL 0.5 0.7 0.5  Alkaline Phos 38 - 126 U/L 80 76 92  AST 15 - 41 U/L _1 ALT 0 - 44 U/L _2 DIAGNOSTIC IMAGING:  I have independently reviewed the scans and discussed with the patient.   I have reviewed Francene Finders, NP's note and agree with the documentation.  I personally performed a face-to-face visit, made revisions and my assessment and plan is as follows.    ASSESSMENT & PLAN:   Adenocarcinoma of lung, stage 4 ([place]) 1.  Metastatic non-small cell lung cancer, adenocarcinoma type BRAF V600E positive: Foundation 1 CDX shows MS-stable, TMB low, PIK3CA E545K, PDL-1 0% - Additional  testing by immunohistochemistry was positive for Napsin-A and TTF-1, favoring lung primary.  He has multiple bilateral lung nodules making him metastatic.  He had a colonoscopy which showed multiple polyps which were tubular adenomas.  His functional status is decent at this time.  He does not have any malignancy related symptoms at this time.   -Dabrafenib 100 mg twice daily and trametinib 1.5 mg once daily (response rates of 65% and median PFS of 10.9 months) on an empty stomach was started on  10/07/2017.  He is not having any problems.  Upon further questioning and confirmation, we found out that he is taking dabrafenib 50 mg twice daily.  2D echocardiogram dated 10/28/2017 which shows ejection fraction of 60%.  I plan to repeat echocardiogram in 2 to 3 months.  He increased dabrafenib 100 mg twice daily on 11/10/2017.  So far he has tolerated very well.  He went to the ER on 11/13/2017 with right occipital pain which improved with injection.  He is taking Lyrica twice daily which is helping with the pain. -PET CT scan dated 12/19/2017 shows decrease in size of the lung nodules and SUV value. - PET/CT scan on 04/11/2018 showed decrease in size of innumerable scattered pulmonary nodules.  There is some increased conspicuity of a confluence of nodules in the left upper lobe with associated groundglass opacity.  This is a maximum SUV of 3, previously 1.9.  Given the configuration, this could be a focus of atypical bronchiolitis, with no new findings of metastatic disease. -  Echocardiogram on 03/20/2018 shows EF of 55%.  Echo on 10/28/2017 shows EF of 60%.  Baseline echo on 06/29/2017 shows EF of 60 to 65%. -Echocardiogram on 05/22/2018 shows EF of 55 to 60%. - He will continue dabrafenib 100 mg twice daily and trametinib 1 mg once daily. -His functional status is stable and he is able to do all activities.  His appetite is normal and weight is stable. -I will see him back in 4 weeks with a repeat PET CT scan.  2.  Hypertension: -He will continue metoprolol 100 mg twice daily and amlodipine 10 mg daily.   3.  CKD: - This is stable around 1.6.      Orders placed this encounter:  Orders Placed This Encounter  Procedures  . NM PET Image Restag (PS) Skull Base To Thigh  . Magnesium  . CBC with Differential/Platelet  . Comprehensive metabolic panel  . Lactate dehydrogenase      Derek Jack, MD [place] [phone]

## 2018-06-26 NOTE — Patient Instructions (Signed)
[place] at [facility] Discharge Instructions  Follow up in 4 weeks with labs and scans    Thank you for choosing [place] at [facility] to provide your oncology and hematology care.  To afford each patient quality time with our provider, please arrive at least 15 minutes before your scheduled appointment time.   If you have a lab appointment with the [facility] please come in thru the  Main Entrance and check in at the main information desk  You need to re-schedule your appointment should you arrive 10 or more minutes late.  We strive to give you quality time with our providers, and arriving late affects you and other patients whose appointments are after yours.  Also, if you no show three or more times for appointments you may be dismissed from the clinic at the providers discretion.     Again, thank you for choosing [facility].  Our hope is that these requests will decrease the amount of time that you wait before being seen by our physicians.       _____________________________________________________________  Should you have questions after your visit to [facility], please contact our office at [phone] between the hours of 8:00 a.m. and 4:30 p.m.  Voicemails left after 4:00 p.m. will not be returned until the following business day.  For prescription refill requests, have your pharmacy contact our office and allow 72 hours.    Cancer Center Support Programs:   > Cancer Support Group  2nd Tuesday of the month 1pm-2pm, Journey Room

## 2018-06-26 NOTE — Assessment & Plan Note (Signed)
1.  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  He has multiple bilateral lung nodules making him metastatic.  He had a colonoscopy which showed multiple polyps which were tubular adenomas.  His functional status is decent at this time.  He does not have any malignancy related symptoms at this time.   -Dabrafenib 100 mg twice daily and trametinib 1.5 mg once daily (response rates of 65% and median PFS of 10.9 months) on an empty stomach was started on 10/07/2017.  He is not having any problems.  Upon further questioning and confirmation, we found out that he is taking dabrafenib 50 mg twice daily.  2D echocardiogram dated 10/28/2017 which shows ejection fraction of 60%.  I plan to repeat echocardiogram in 2 to 3 months.  He increased dabrafenib 100 mg twice daily on 11/10/2017.  So far he has tolerated very well.  He went to the ER on 11/13/2017 with right occipital pain which improved with injection.  He is taking Lyrica twice daily which is helping with the pain. -PET CT scan dated 12/19/2017 shows decrease in size of the lung nodules and SUV value. - PET/CT scan on 04/11/2018 showed decrease in size of innumerable scattered pulmonary nodules.  There is some increased conspicuity of a confluence of nodules in the left upper lobe with associated groundglass opacity.  This is a maximum SUV of 3, previously 1.9.  Given the configuration, this could be a focus of atypical bronchiolitis, with no new findings of metastatic disease. -  Echocardiogram on 03/20/2018 shows EF of 55%.  Echo on 10/28/2017 shows EF of 60%.  Baseline echo on 06/29/2017 shows EF of 60 to 65%. -Echocardiogram on 05/22/2018 shows EF of 55 to 60%. - He will continue dabrafenib 100 mg twice daily and trametinib 1 mg once daily. -His functional  status is stable and he is able to do all activities.  His appetite is normal and weight is stable. -I will see him back in 4 weeks with a repeat PET CT scan.  2.  Hypertension: -He will continue metoprolol 100 mg twice daily and amlodipine 10 mg daily.   3.  CKD: - This is stable around 1.6.

## 2018-06-29 DIAGNOSIS — I2510 Atherosclerotic heart disease of native coronary artery without angina pectoris: Secondary | ICD-10-CM | POA: Diagnosis not present

## 2018-06-29 DIAGNOSIS — Z79899 Other long term (current) drug therapy: Secondary | ICD-10-CM | POA: Diagnosis not present

## 2018-06-29 DIAGNOSIS — E1129 Type 2 diabetes mellitus with other diabetic kidney complication: Secondary | ICD-10-CM | POA: Diagnosis not present

## 2018-06-29 DIAGNOSIS — N183 Chronic kidney disease, stage 3 (moderate): Secondary | ICD-10-CM | POA: Diagnosis not present

## 2018-07-06 DIAGNOSIS — E1122 Type 2 diabetes mellitus with diabetic chronic kidney disease: Secondary | ICD-10-CM | POA: Diagnosis not present

## 2018-07-06 DIAGNOSIS — C3490 Malignant neoplasm of unspecified part of unspecified bronchus or lung: Secondary | ICD-10-CM | POA: Diagnosis not present

## 2018-07-06 DIAGNOSIS — N183 Chronic kidney disease, stage 3 (moderate): Secondary | ICD-10-CM | POA: Diagnosis not present

## 2018-07-17 ENCOUNTER — Other Ambulatory Visit: Payer: Self-pay | Admitting: Cardiovascular Disease

## 2018-07-18 ENCOUNTER — Telehealth: Payer: Self-pay | Admitting: Cardiovascular Disease

## 2018-07-18 DIAGNOSIS — I10 Essential (primary) hypertension: Secondary | ICD-10-CM

## 2018-07-18 MED ORDER — AMLODIPINE BESYLATE 10 MG PO TABS: 10.0000 mg | ORAL_TABLET | Freq: Every day | ORAL | 3 refills | Status: DC

## 2018-07-18 NOTE — Telephone Encounter (Signed)
We refilled amlodipine for patient

## 2018-07-18 NOTE — Telephone Encounter (Signed)
[facility] called and said that Dr. Tera Helper doesn't want to refill this anymore  amLODipine (NORVASC) 10 MG tablet [[id number]]

## 2018-07-31 ENCOUNTER — Inpatient Hospital Stay (HOSPITAL_COMMUNITY): Payer: Medicare Other | Attending: Hematology

## 2018-07-31 ENCOUNTER — Other Ambulatory Visit (HOSPITAL_COMMUNITY): Payer: Self-pay

## 2018-07-31 ENCOUNTER — Other Ambulatory Visit: Payer: Self-pay

## 2018-07-31 ENCOUNTER — Encounter (HOSPITAL_COMMUNITY)
Admission: RE | Admit: 2018-07-31 | Discharge: 2018-07-31 | Disposition: A | Discharge status: Home / Self Care | Payer: Medicare Other | Source: Ambulatory Visit | Attending: Nurse Practitioner | Admitting: Nurse Practitioner

## 2018-07-31 DIAGNOSIS — Z87891 Personal history of nicotine dependence: Secondary | ICD-10-CM | POA: Diagnosis not present

## 2018-07-31 DIAGNOSIS — N189 Chronic kidney disease, unspecified: Secondary | ICD-10-CM | POA: Diagnosis not present

## 2018-07-31 DIAGNOSIS — C3411 Malignant neoplasm of upper lobe, right bronchus or lung: Secondary | ICD-10-CM | POA: Diagnosis not present

## 2018-07-31 DIAGNOSIS — Z809 Family history of malignant neoplasm, unspecified: Secondary | ICD-10-CM | POA: Insufficient documentation

## 2018-07-31 DIAGNOSIS — Z800 Family history of malignant neoplasm of digestive organs: Secondary | ICD-10-CM | POA: Insufficient documentation

## 2018-07-31 DIAGNOSIS — I129 Hypertensive chronic kidney disease with stage 1 through stage 4 chronic kidney disease, or unspecified chronic kidney disease: Secondary | ICD-10-CM | POA: Diagnosis not present

## 2018-07-31 DIAGNOSIS — C3490 Malignant neoplasm of unspecified part of unspecified bronchus or lung: Secondary | ICD-10-CM

## 2018-07-31 LAB — CBC WITH DIFFERENTIAL/PLATELET
Abs Immature Granulocytes: 0.04 10*3/uL (ref 0.00–0.07)
Basophils Absolute: 0.1 10*3/uL (ref 0.0–0.1)
Basophils Relative: 1 %
Eosinophils Absolute: 0 10*3/uL (ref 0.0–0.5)
Eosinophils Relative: 1 %
HEMATOCRIT: 39.8 % (ref 39.0–52.0)
HEMOGLOBIN: 12.9 g/dL — AB (ref 13.0–17.0)
Immature Granulocytes: 1 %
LYMPHS PCT: 30 %
Lymphs Abs: 2.5 10*3/uL (ref 0.7–4.0)
MCH: 26.4 pg (ref 26.0–34.0)
MCHC: 32.4 g/dL (ref 30.0–36.0)
MCV: 81.4 fL (ref 80.0–100.0)
Monocytes Absolute: 0.6 10*3/uL (ref 0.1–1.0)
Monocytes Relative: 7 %
Neutro Abs: 5.2 10*3/uL (ref 1.7–7.7)
Neutrophils Relative %: 60 %
Platelets: 239 10*3/uL (ref 150–400)
RBC: 4.89 MIL/uL (ref 4.22–5.81)
RDW: 14.7 % (ref 11.5–15.5)
WBC: 8.4 10*3/uL (ref 4.0–10.5)
nRBC: 0 % (ref 0.0–0.2)

## 2018-07-31 LAB — COMPREHENSIVE METABOLIC PANEL
ALK PHOS: 97 U/L (ref 38–126)
ALT: 15 U/L (ref 0–44)
AST: 18 U/L (ref 15–41)
Albumin: 3.5 g/dL (ref 3.5–5.0)
Anion gap: 5 (ref 5–15)
BILIRUBIN TOTAL: 0.5 mg/dL (ref 0.3–1.2)
BUN: 21 mg/dL (ref 8–23)
CALCIUM: 8.9 mg/dL (ref 8.9–10.3)
CO2: 27 mmol/L (ref 22–32)
Chloride: 101 mmol/L (ref 98–111)
Creatinine, Ser: 1.5 mg/dL — ABNORMAL HIGH (ref 0.61–1.24)
GFR calc Af Amer: 51 mL/min — ABNORMAL LOW (ref >60)
GFR calc non Af Amer: 44 mL/min — ABNORMAL LOW (ref >60)
GLUCOSE: 176 mg/dL — AB (ref 70–99)
Potassium: 4.9 mmol/L (ref 3.5–5.1)
Sodium: 133 mmol/L — ABNORMAL LOW (ref 135–145)
Total Protein: 7 g/dL (ref 6.5–8.1)

## 2018-07-31 LAB — MAGNESIUM: Magnesium: 1.9 mg/dL (ref 1.7–2.4)

## 2018-07-31 LAB — LACTATE DEHYDROGENASE: LDH: 161 U/L (ref 98–192)

## 2018-07-31 MED ORDER — FLUDEOXYGLUCOSE F - 18 (FDG) INJECTION
10.7800 | Freq: Once | INTRAVENOUS | Status: AC | PRN
  Administered 2018-07-31: 10.78 via INTRAVENOUS

## 2018-08-01 ENCOUNTER — Encounter: Payer: Self-pay | Admitting: Internal Medicine

## 2018-08-03 ENCOUNTER — Other Ambulatory Visit (HOSPITAL_COMMUNITY): Payer: Self-pay | Admitting: Hematology

## 2018-08-03 DIAGNOSIS — C3490 Malignant neoplasm of unspecified part of unspecified bronchus or lung: Secondary | ICD-10-CM

## 2018-08-04 ENCOUNTER — Encounter (HOSPITAL_COMMUNITY): Payer: Self-pay | Admitting: Hematology

## 2018-08-04 ENCOUNTER — Other Ambulatory Visit: Payer: Self-pay

## 2018-08-04 ENCOUNTER — Inpatient Hospital Stay (HOSPITAL_BASED_OUTPATIENT_CLINIC_OR_DEPARTMENT_OTHER): Payer: Medicare Other | Admitting: Hematology

## 2018-08-04 DIAGNOSIS — Z809 Family history of malignant neoplasm, unspecified: Secondary | ICD-10-CM

## 2018-08-04 DIAGNOSIS — C3490 Malignant neoplasm of unspecified part of unspecified bronchus or lung: Secondary | ICD-10-CM

## 2018-08-04 DIAGNOSIS — Z87891 Personal history of nicotine dependence: Secondary | ICD-10-CM

## 2018-08-04 DIAGNOSIS — C3411 Malignant neoplasm of upper lobe, right bronchus or lung: Secondary | ICD-10-CM

## 2018-08-04 DIAGNOSIS — Z800 Family history of malignant neoplasm of digestive organs: Secondary | ICD-10-CM | POA: Diagnosis not present

## 2018-08-04 DIAGNOSIS — I129 Hypertensive chronic kidney disease with stage 1 through stage 4 chronic kidney disease, or unspecified chronic kidney disease: Secondary | ICD-10-CM | POA: Diagnosis not present

## 2018-08-04 DIAGNOSIS — N189 Chronic kidney disease, unspecified: Secondary | ICD-10-CM | POA: Diagnosis not present

## 2018-08-04 NOTE — Progress Notes (Signed)
[place], [place] [address]   CLINIC:  Medical Oncology/Hematology  PCP:  [name], [place] [address] [phone]   REASON FOR VISIT:  Follow-up for metastatic non-small cell lung cancer  CURRENT THERAPY:Dabrafenib and Trametinib    BRIEF ONCOLOGIC HISTORY:   No history exists.     CANCER STAGING: Cancer Staging No matching staging information was found for the patient.   INTERVAL HISTORY:  [name] 78 y.o. male returns for routine follow-up. He is here today with his wife. He states that he has not had any problems since his last visit. Denies any nausea, vomiting, or diarrhea. Denies any new pains. Had not noticed any recent bleeding such as epistaxis, hematuria or hematochezia. Denies recent chest pain on exertion, shortness of breath on minimal exertion, pre-syncopal episodes, or palpitations. Denies any numbness or tingling in hands or feet. Denies any recent fevers, infections, or recent hospitalizations. Patient reports appetite at 100% and energy level at 100%.   REVIEW OF SYSTEMS:  Review of Systems  All other systems reviewed and are negative.    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   LIMA to LAD, SVG to D1, SVG to OM2, SVG to RCA, open vein harvest right thigh and lower leg   Spontaneous pneumothorax 09/15/2010   right   Past Surgical History:  Procedure Laterality Date   ANKLE FRACTURE SURGERY  2008   [facility]   APPENDECTOMY  [phone]   CHEST TUBE INSERTION Right 09/15/2010   Dr Servando Snare - spontaneous PTX   CHOLECYSTECTOMY  2008   COLONOSCOPY N/A 09/07/2017   Procedure: COLONOSCOPY;  Surgeon: Daneil Dolin, MD;  Location[facility];  Service: Endoscopy;  Laterality:  N/A;  10:30am   CORONARY ARTERY BYPASS GRAFT  2003   EYE SURGERY  2001   Cataracts removed bilaterally   INCISIONAL HERNIA REPAIR N/A 01/29/2015   Procedure: Fatima Blank HERNIORRHAPHY WITH MESH;  Surgeon: Aviva Signs, MD;  Location: [facility] ORS;  Service: General;  Laterality: N/A;   INSERTION OF MESH N/A 01/29/2015   Procedure: INSERTION OF MESH;  Surgeon: Aviva Signs, MD;  Location: [facility] ORS;  Service: General;  Laterality: N/A;   [place] CATH AND CORS/GRAFTS ANGIOGRAPHY N/A 06/20/2017   Procedure: LEFT HEART CATH AND CORS/GRAFTS ANGIOGRAPHY;  Surgeon: Martinique, Peter M, MD;  Location: [place] CV LAB;  Service: Cardiovascular;  Laterality: N/A;   ORIF ANKLE FRACTURE  08/26/2011   Procedure: OPEN REDUCTION INTERNAL FIXATION (ORIF) ANKLE FRACTURE;  Surgeon: Sanjuana Kava, MD;  Location: [facility] ORS;  Service: Orthopedics;  Laterality: Right;   POLYPECTOMY  09/07/2017   Procedure: POLYPECTOMY;  Surgeon: Daneil Dolin, MD;  Location: [facility];  Service: Endoscopy;;  ascending x2 (cold snare)   PROSTATE SURGERY  2012   Enlarged prostate   TRANSURETHRAL RESECTION OF PROSTATE  2012     SOCIAL HISTORY:  Social History   Socioeconomic History   Marital status: Married    Spouse name: Not on file   Number of children: Not on file   Years of education: Not on file   Highest education level: Not on file  Occupational History  Not on file  Social Needs   Financial resource strain: Not on file   Food insecurity:    Worry: Not on file    Inability: Not on file   Transportation needs:    Medical: Not on file    Non-medical: Not on file  Tobacco Use   Smoking status: Former Smoker    Packs/day: 1.00    Years: 33.00    Pack years: 33.00    Last attempt to quit: 05/17/1987    Years since quitting: 31.2   Smokeless tobacco: Never Used  Substance and Sexual Activity   Alcohol use: No   Drug use: No   Sexual activity:  Yes  Lifestyle   Physical activity:    Days per week: Not on file    Minutes per session: Not on file   Stress: Not on file  Relationships   Social connections:    Talks on phone: Not on file    Gets together: Not on file    Attends religious service: Not on file    Active member of club or organization: Not on file    Attends meetings of clubs or organizations: Not on file    Relationship status: Not on file   Intimate partner violence:    Fear of current or ex partner: Not on file    Emotionally abused: Not on file    Physically abused: Not on file    Forced sexual activity: Not on file  Other Topics Concern   Not on file  Social History Narrative   Not on file    FAMILY HISTORY:  Family History  Problem Relation Age of Onset   Aneurysm Mother    Heart attack Father    Colon cancer Maternal Uncle    Cancer Brother    Gastric cancer Neg Hx    Esophageal cancer Neg Hx     CURRENT MEDICATIONS:  Outpatient Encounter Medications as of 08/04/2018  Medication Sig   amLODipine (NORVASC) 10 MG tablet Take 1 tablet (10 mg total) by mouth daily.   aspirin EC 81 MG tablet Take 81 mg by mouth daily.   Cholecalciferol (VITAMIN D3) 5000 units CAPS Take 5,000 Units by mouth daily.   escitalopram (LEXAPRO) 10 MG tablet    glimepiride (AMARYL) 2 MG tablet Take 2 mg by mouth daily with breakfast.    isosorbide mononitrate (IMDUR) 30 MG 24 hr tablet TAKE ONE TABLET (30MG TOTAL) BY MOUTH DAILY   metFORMIN (GLUCOPHAGE) 500 MG tablet Take 1 tablet (500 mg total) by mouth 2 (two) times daily with a meal.   metoprolol (LOPRESSOR) 100 MG tablet Take 100 mg by mouth 2 (two) times daily.   nitroGLYCERIN (NITROSTAT) 0.4 MG SL tablet Place 0.4 mg under the tongue every 5 (five) minutes as needed for chest pain.    ondansetron (ZOFRAN) 8 MG tablet Take 1 tablet (8 mg total) by mouth every 8 (eight) hours as needed for nausea or vomiting.   ONE TOUCH ULTRA TEST test strip      pioglitazone (ACTOS) 45 MG tablet Take 45 mg by mouth daily.   sitaGLIPtin (JANUVIA) 50 MG tablet Take 50 mg by mouth daily.    [DISCONTINUED] dabrafenib mesylate (TAFLINAR) 50 MG capsule Take 2 capsules (100 mg total) by mouth 2 (two) times daily. Take on an empty stomach 1 hour before or 2 hours after meals.   [DISCONTINUED] trametinib dimethyl sulfoxide (MEKINIST) 0.5 MG tablet Take 2 tablets (1 mg total) by mouth daily.  Take 1 hour before or 2 hours after a meal. Store refrigerated in original container.   prochlorperazine (COMPAZINE) 10 MG tablet Take 1 tablet (10 mg total) by mouth every 6 (six) hours as needed for nausea or vomiting. (Patient not taking: Reported on 08/04/2018)   No facility-administered encounter medications on file as of 08/04/2018.     ALLERGIES:  No Known Allergies   PHYSICAL EXAM:  ECOG Performance status: 1  Vitals:   08/04/18 1020  BP: 140/63  Pulse: (!) 50  Temp: (!) 97.5 F (36.4 C)  SpO2: 100%   Filed Weights   08/04/18 1020  Weight: 188 lb 14.4 oz (85.7 kg)    Physical Exam Constitutional:      Appearance: Normal appearance.  Cardiovascular:     Rate and Rhythm: Normal rate and regular rhythm.     Heart sounds: Normal heart sounds.  Pulmonary:     Effort: Pulmonary effort is normal.     Breath sounds: Normal breath sounds.  Abdominal:     Palpations: Abdomen is soft. There is no mass.  Musculoskeletal:     Left lower leg: No edema.  Skin:    General: Skin is warm.  Neurological:     General: No focal deficit present.     Mental Status: He is alert and oriented to person, place, and time.  Psychiatric:        Mood and Affect: Mood normal.        Behavior: Behavior normal.      LABORATORY DATA:  I have reviewed the labs as listed.  CBC    Component Value Date/Time   WBC 8.4 07/31/2018 1312   RBC 4.89 07/31/2018 1312   HGB 12.9 (L) 07/31/2018 1312   HCT 39.8 07/31/2018 1312   PLT 239 07/31/2018 1312   MCV 81.4  07/31/2018 1312   MCH 26.4 07/31/2018 1312   MCHC 32.4 07/31/2018 1312   RDW 14.7 07/31/2018 1312   LYMPHSABS 2.5 07/31/2018 1312   MONOABS 0.6 07/31/2018 1312   EOSABS 0.0 07/31/2018 1312   BASOSABS 0.1 07/31/2018 1312   CMP Latest Ref Rng & Units 07/31/2018 06/23/2018 05/22/2018  Glucose 70 - 99 mg/dL 176(H) 193(H) 268(H)  BUN 8 - 23 mg/dL 21 24(H) 19  Creatinine 0.61 - 1.24 mg/dL 1.50(H) 1.50(H) 1.58(H)  Sodium 135 - 145 mmol/L 133(L) 132(L) 132(L)  Potassium 3.5 - 5.1 mmol/L 4.9 4.9 4.7  Chloride 98 - 111 mmol/L 101 101 101  CO2 22 - 32 mmol/L '27 25 25  ' Calcium 8.9 - 10.3 mg/dL 8.9 8.6(L) 8.9  Total Protein 6.5 - 8.1 g/dL 7.0 7.0 6.9  Total Bilirubin 0.3 - 1.2 mg/dL 0.5 0.5 0.7  Alkaline Phos 38 - 126 U/L 97 80 76  AST 15 - 41 U/L '18 18 18  ' ALT 0 - 44 U/L '15 13 12       ' DIAGNOSTIC IMAGING:  I have independently reviewed the scans and discussed with the patient.   I have reviewed Venita Lick LPN's note and agree with the documentation.  I personally performed a face-to-face visit, made revisions and my assessment and plan is as follows.    ASSESSMENT & PLAN:   Adenocarcinoma of lung, stage 4 ([place]) 1.  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  He has multiple bilateral lung nodules making  him metastatic.  He had a colonoscopy which showed multiple polyps which were tubular adenomas.  His functional status is decent at this time.  He does not have any malignancy related symptoms at this time.   -Dabrafenib 100 mg twice daily and trametinib 1.5 mg once daily (response rates of 65% and median PFS of 10.9 months) on an empty stomach was started on 10/07/2017.  He is not having any problems.  Upon further questioning and confirmation, we found out that he is taking dabrafenib  50 mg twice daily.  2D echocardiogram dated 10/28/2017 which shows ejection fraction of 60%.  I plan to repeat echocardiogram in 2 to 3 months.  He increased dabrafenib 100 mg twice daily on 11/10/2017.  So far he has tolerated very well.  He went to the ER on 11/13/2017 with right occipital pain which improved with injection.  He is taking Lyrica twice daily which is helping with the pain. -PET CT scan dated 12/19/2017 shows decrease in size of the lung nodules and SUV value. - PET/CT scan on 04/11/2018 showed decrease in size of innumerable scattered pulmonary nodules.  There is some increased conspicuity of a confluence of nodules in the left upper lobe with associated groundglass opacity.  This is a maximum SUV of 3, previously 1.9.  Given the configuration, this could be a focus of atypical bronchiolitis, with no new findings of metastatic disease. -  Echocardiogram on 03/20/2018 shows EF of 55%.  Echo on 10/28/2017 shows EF of 60%.  Baseline echo on 06/29/2017 shows EF of 60 to 65%. -Echocardiogram on 05/22/2018 shows EF of 55 to 60%. -We reviewed the PET scan from 07/31/2018 which showed multiple bilateral small 5 to 10 mm pulmonary nodules throughout the lungs.  The number and size of nodules are not appreciably changed in the interval.  There is no associated metabolic activity.  No evidence of new or progressive disease. - He will continue dabrafenib 100 mg twice daily and trametinib 1 mg once daily. - We will plan to see him back 4 to 6 weeks with repeat blood work.  I will also plan to repeat echocardiogram prior to next visit.  2.  Hypertension: -Blood pressure today is 140/63.  He will continue metoprolol 100 mg twice daily and amlodipine 10 mg daily.  3.  CKD: -His creatinine is around 1.5 and stable.       Orders placed this encounter:  Orders Placed This Encounter  Procedures   ECHOCARDIOGRAM COMPLETE      Derek Jack, MD [place] [phone]

## 2018-08-04 NOTE — Assessment & Plan Note (Signed)
1.  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  He has multiple bilateral lung nodules making him metastatic.  He had a colonoscopy which showed multiple polyps which were tubular adenomas.  His functional status is decent at this time.  He does not have any malignancy related symptoms at this time.   -Dabrafenib 100 mg twice daily and trametinib 1.5 mg once daily (response rates of 65% and median PFS of 10.9 months) on an empty stomach was started on 10/07/2017.  He is not having any problems.  Upon further questioning and confirmation, we found out that he is taking dabrafenib 50 mg twice daily.  2D echocardiogram dated 10/28/2017 which shows ejection fraction of 60%.  I plan to repeat echocardiogram in 2 to 3 months.  He increased dabrafenib 100 mg twice daily on 11/10/2017.  So far he has tolerated very well.  He went to the ER on 11/13/2017 with right occipital pain which improved with injection.  He is taking Lyrica twice daily which is helping with the pain. -PET CT scan dated 12/19/2017 shows decrease in size of the lung nodules and SUV value. - PET/CT scan on 04/11/2018 showed decrease in size of innumerable scattered pulmonary nodules.  There is some increased conspicuity of a confluence of nodules in the left upper lobe with associated groundglass opacity.  This is a maximum SUV of 3, previously 1.9.  Given the configuration, this could be a focus of atypical bronchiolitis, with no new findings of metastatic disease. -  Echocardiogram on 03/20/2018 shows EF of 55%.  Echo on 10/28/2017 shows EF of 60%.  Baseline echo on 06/29/2017 shows EF of 60 to 65%. -Echocardiogram on 05/22/2018 shows EF of 55 to 60%. -We reviewed the PET scan from 07/31/2018 which showed multiple bilateral small 5 to 10 mm pulmonary  nodules throughout the lungs.  The number and size of nodules are not appreciably changed in the interval.  There is no associated metabolic activity.  No evidence of new or progressive disease. - He will continue dabrafenib 100 mg twice daily and trametinib 1 mg once daily. - We will plan to see him back 4 to 6 weeks with repeat blood work.  I will also plan to repeat echocardiogram prior to next visit.  2.  Hypertension: -Blood pressure today is 140/63.  He will continue metoprolol 100 mg twice daily and amlodipine 10 mg daily.  3.  CKD: -His creatinine is around 1.5 and stable.  

## 2018-08-04 NOTE — Patient Instructions (Addendum)
[place] at [facility] Discharge Instructions  You were seen today by Dr. Delton Coombes. He went over your recent test results. He will see you back in 5 weeks  for labs, ECHO and follow up.   Thank you for choosing [place] at [facility] to provide your oncology and hematology care.  To afford each patient quality time with our provider, please arrive at least 15 minutes before your scheduled appointment time.   If you have a lab appointment with the [facility] please come in thru the  Main Entrance and check in at the main information desk  You need to re-schedule your appointment should you arrive 10 or more minutes late.  We strive to give you quality time with our providers, and arriving late affects you and other patients whose appointments are after yours.  Also, if you no show three or more times for appointments you may be dismissed from the clinic at the providers discretion.     Again, thank you for choosing [facility].  Our hope is that these requests will decrease the amount of time that you wait before being seen by our physicians.       _____________________________________________________________  Should you have questions after your visit to [facility], please contact our office at [phone] between the hours of 8:00 a.m. and 4:30 p.m.  Voicemails left after 4:00 p.m. will not be returned until the following business day.  For prescription refill requests, have your pharmacy contact our office and allow 72 hours.    Cancer Center Support Programs:   > Cancer Support Group  2nd Tuesday of the month 1pm-2pm, Journey Room

## 2018-08-07 MED FILL — MEKINIST 0.5 MG TABLET: 0.5 | 30 days supply | Qty: 60 | Fill #0

## 2018-08-07 MED FILL — TAFINLAR 50 MG CAPS: 50 | 30 days supply | Qty: 120 | Fill #0

## 2018-08-26 ENCOUNTER — Other Ambulatory Visit (HOSPITAL_COMMUNITY): Payer: Self-pay | Admitting: Hematology

## 2018-08-26 DIAGNOSIS — C3490 Malignant neoplasm of unspecified part of unspecified bronchus or lung: Secondary | ICD-10-CM

## 2018-08-31 MED FILL — MEKINIST 0.5 MG TABLET: 0.5 | 30 days supply | Qty: 60 | Fill #0

## 2018-08-31 MED FILL — TAFINLAR 50 MG CAPS: 50 | 30 days supply | Qty: 120 | Fill #0

## 2018-09-06 ENCOUNTER — Telehealth (HOSPITAL_COMMUNITY): Payer: Self-pay | Admitting: Pharmacist

## 2018-09-06 NOTE — Telephone Encounter (Signed)
Oral Chemotherapy Pharmacist Encounter   Patient was filling his Mekinist/Tafinlar at [facility] he is low on his PANF funding and does not have enough funding left to cover his May medication fill. He is still approved for manufacture assistance through the [employer] until the end of 2020.   He will now use this program to provide his medications at no cost. Prescriptions and refills for Mekinist and Tafinlar should be sent to [facility], this pharmacy has been added to his pharmacy list. I called spoke with the patient's wife Wilma and explained this change in pharmacy to her. She stated her understanding. Provided her with the pharmacy phone number [phone].   Darl Pikes, PharmD, BCPS, [facility] Hematology/Oncology Clinical Pharmacist [facility] [phone]  09/06/2018 3:52 PM

## 2018-09-07 ENCOUNTER — Other Ambulatory Visit (HOSPITAL_COMMUNITY): Payer: Self-pay | Admitting: *Deleted

## 2018-09-07 DIAGNOSIS — C3490 Malignant neoplasm of unspecified part of unspecified bronchus or lung: Secondary | ICD-10-CM

## 2018-09-07 MED ORDER — DABRAFENIB MESYLATE 50 MG PO CAPS: ORAL_CAPSULE | ORAL | 0 refills | Status: DC

## 2018-09-07 MED ORDER — TRAMETINIB DIMETHYL SULFOXIDE 0.5 MG PO TABS: ORAL_TABLET | ORAL | 0 refills | Status: DC

## 2018-09-07 NOTE — Telephone Encounter (Signed)
New prescriptions needed by pharmacist, sent electronically to [facility]

## 2018-09-08 ENCOUNTER — Other Ambulatory Visit (HOSPITAL_COMMUNITY): Payer: Self-pay | Admitting: *Deleted

## 2018-09-08 DIAGNOSIS — C801 Malignant (primary) neoplasm, unspecified: Secondary | ICD-10-CM

## 2018-09-08 DIAGNOSIS — C3490 Malignant neoplasm of unspecified part of unspecified bronchus or lung: Secondary | ICD-10-CM

## 2018-09-11 ENCOUNTER — Inpatient Hospital Stay (HOSPITAL_COMMUNITY): Payer: Medicare Other | Attending: Hematology

## 2018-09-11 ENCOUNTER — Ambulatory Visit (HOSPITAL_COMMUNITY)
Admission: RE | Admit: 2018-09-11 | Discharge: 2018-09-11 | Disposition: A | Discharge status: Home / Self Care | Payer: Medicare Other | Source: Ambulatory Visit | Attending: Hematology | Admitting: Hematology

## 2018-09-11 ENCOUNTER — Other Ambulatory Visit: Payer: Self-pay

## 2018-09-11 DIAGNOSIS — Z951 Presence of aortocoronary bypass graft: Secondary | ICD-10-CM | POA: Diagnosis not present

## 2018-09-11 DIAGNOSIS — E119 Type 2 diabetes mellitus without complications: Secondary | ICD-10-CM | POA: Insufficient documentation

## 2018-09-11 DIAGNOSIS — C3490 Malignant neoplasm of unspecified part of unspecified bronchus or lung: Secondary | ICD-10-CM | POA: Insufficient documentation

## 2018-09-11 DIAGNOSIS — Z87891 Personal history of nicotine dependence: Secondary | ICD-10-CM | POA: Diagnosis not present

## 2018-09-11 DIAGNOSIS — N189 Chronic kidney disease, unspecified: Secondary | ICD-10-CM | POA: Insufficient documentation

## 2018-09-11 DIAGNOSIS — I10 Essential (primary) hypertension: Secondary | ICD-10-CM | POA: Insufficient documentation

## 2018-09-11 DIAGNOSIS — I129 Hypertensive chronic kidney disease with stage 1 through stage 4 chronic kidney disease, or unspecified chronic kidney disease: Secondary | ICD-10-CM | POA: Diagnosis not present

## 2018-09-11 DIAGNOSIS — C3411 Malignant neoplasm of upper lobe, right bronchus or lung: Secondary | ICD-10-CM | POA: Diagnosis not present

## 2018-09-11 DIAGNOSIS — Z800 Family history of malignant neoplasm of digestive organs: Secondary | ICD-10-CM | POA: Insufficient documentation

## 2018-09-11 DIAGNOSIS — C801 Malignant (primary) neoplasm, unspecified: Secondary | ICD-10-CM

## 2018-09-11 LAB — CBC WITH DIFFERENTIAL/PLATELET
Abs Immature Granulocytes: 0.02 10*3/uL (ref 0.00–0.07)
Basophils Absolute: 0 10*3/uL (ref 0.0–0.1)
Basophils Relative: 0 %
Eosinophils Absolute: 0 10*3/uL (ref 0.0–0.5)
Eosinophils Relative: 0 %
HCT: 36.9 % — ABNORMAL LOW (ref 39.0–52.0)
Hemoglobin: 12.4 g/dL — ABNORMAL LOW (ref 13.0–17.0)
Immature Granulocytes: 0 %
Lymphocytes Relative: 24 %
Lymphs Abs: 1.2 10*3/uL (ref 0.7–4.0)
MCH: 27.4 pg (ref 26.0–34.0)
MCHC: 33.6 g/dL (ref 30.0–36.0)
MCV: 81.5 fL (ref 80.0–100.0)
Monocytes Absolute: 0.7 10*3/uL (ref 0.1–1.0)
Monocytes Relative: 14 %
Neutro Abs: 2.9 10*3/uL (ref 1.7–7.7)
Neutrophils Relative %: 62 %
Platelets: 181 10*3/uL (ref 150–400)
RBC: 4.53 MIL/uL (ref 4.22–5.81)
RDW: 14.9 % (ref 11.5–15.5)
WBC: 4.9 10*3/uL (ref 4.0–10.5)
nRBC: 0 % (ref 0.0–0.2)

## 2018-09-11 LAB — COMPREHENSIVE METABOLIC PANEL
ALT: 15 U/L (ref 0–44)
AST: 19 U/L (ref 15–41)
Albumin: 3.7 g/dL (ref 3.5–5.0)
Alkaline Phosphatase: 100 U/L (ref 38–126)
Anion gap: 7 (ref 5–15)
BUN: 25 mg/dL — ABNORMAL HIGH (ref 8–23)
CO2: 26 mmol/L (ref 22–32)
Calcium: 8.8 mg/dL — ABNORMAL LOW (ref 8.9–10.3)
Chloride: 98 mmol/L (ref 98–111)
Creatinine, Ser: 1.68 mg/dL — ABNORMAL HIGH (ref 0.61–1.24)
GFR calc Af Amer: 44 mL/min — ABNORMAL LOW (ref >60)
GFR calc non Af Amer: 38 mL/min — ABNORMAL LOW (ref >60)
Glucose, Bld: 201 mg/dL — ABNORMAL HIGH (ref 70–99)
Potassium: 4.4 mmol/L (ref 3.5–5.1)
Sodium: 131 mmol/L — ABNORMAL LOW (ref 135–145)
Total Bilirubin: 0.8 mg/dL (ref 0.3–1.2)
Total Protein: 7.4 g/dL (ref 6.5–8.1)

## 2018-09-11 NOTE — Progress Notes (Signed)
*  PRELIMINARY RESULTS* Echocardiogram 2D Echocardiogram has been performed.  Samuel Germany 09/11/2018, 11:49 AM

## 2018-09-12 ENCOUNTER — Other Ambulatory Visit: Payer: Self-pay

## 2018-09-13 ENCOUNTER — Other Ambulatory Visit (HOSPITAL_COMMUNITY): Payer: Self-pay

## 2018-09-13 ENCOUNTER — Inpatient Hospital Stay (HOSPITAL_BASED_OUTPATIENT_CLINIC_OR_DEPARTMENT_OTHER): Payer: Medicare Other | Admitting: Hematology

## 2018-09-13 ENCOUNTER — Encounter (HOSPITAL_COMMUNITY): Payer: Self-pay | Admitting: Hematology

## 2018-09-13 VITALS — BP 129/52 | HR 52 | Temp 97.6°F | Resp 18 | Wt 189.0 lb

## 2018-09-13 DIAGNOSIS — C3490 Malignant neoplasm of unspecified part of unspecified bronchus or lung: Secondary | ICD-10-CM

## 2018-09-13 DIAGNOSIS — Z800 Family history of malignant neoplasm of digestive organs: Secondary | ICD-10-CM | POA: Diagnosis not present

## 2018-09-13 DIAGNOSIS — N189 Chronic kidney disease, unspecified: Secondary | ICD-10-CM | POA: Diagnosis not present

## 2018-09-13 DIAGNOSIS — C3411 Malignant neoplasm of upper lobe, right bronchus or lung: Secondary | ICD-10-CM

## 2018-09-13 DIAGNOSIS — I129 Hypertensive chronic kidney disease with stage 1 through stage 4 chronic kidney disease, or unspecified chronic kidney disease: Secondary | ICD-10-CM | POA: Diagnosis not present

## 2018-09-13 DIAGNOSIS — Z87891 Personal history of nicotine dependence: Secondary | ICD-10-CM

## 2018-09-13 NOTE — Progress Notes (Signed)
[name], [place] [address]   CLINIC:  Medical Oncology/Hematology  PCP:  [name], MD [address] [place] [address] [phone]   REASON FOR VISIT:  Follow-up for Adenocarcinoma of the lung     INTERVAL HISTORY:  [name] 78 y.o. male returns for routine follow-up. He is here today alone. He states that is has been doing well since his last visit. He states that he is taking his medication as directed with no missed doses or side effects. Denies any nausea, vomiting, or diarrhea. Denies any new pains. Had not noticed any recent bleeding such as epistaxis, hematuria or hematochezia. Denies recent chest pain on exertion, shortness of breath on minimal exertion, pre-syncopal episodes, or palpitations. Denies any numbness or tingling in hands or feet. Denies any recent fevers, infections, or recent hospitalizations. Patient reports appetite at 100% and energy level at 50%.   REVIEW OF SYSTEMS:  Review of Systems  All other systems reviewed and are negative.    PAST MEDICAL/SURGICAL HISTORY:  Past Medical History:  Diagnosis Date  . Cancer ([place])   . Chronic kidney disease   . Coronary artery disease   . Diabetes mellitus   . Hypertension   . 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 Spontaneous pneumothorax 09/15/2010   right   Past Surgical History:  Procedure Laterality Date  . ANKLE FRACTURE SURGERY  2008   [facility]  . APPENDECTOMY  1960's  . CHEST TUBE INSERTION Right 09/15/2010   Dr Servando Snare - spontaneous PTX  . CHOLECYSTECTOMY  2008  . COLONOSCOPY N/A 09/07/2017   Procedure: COLONOSCOPY;  Surgeon: Daneil Dolin, MD;  Location: [facility];  Service: Endoscopy;  Laterality: N/A;  10:30am  . CORONARY ARTERY BYPASS GRAFT  2003  . EYE SURGERY  2001   Cataracts removed bilaterally  . INCISIONAL  HERNIA REPAIR N/A 01/29/2015   Procedure: Fatima Blank HERNIORRHAPHY WITH MESH;  Surgeon: Aviva Signs, MD;  Location: [facility] ORS;  Service: General;  Laterality: N/A;  . INSERTION OF MESH N/A 01/29/2015   Procedure: INSERTION OF MESH;  Surgeon: Aviva Signs, MD;  Location: [facility] OR[facility];  Service: General;  Laterality: N/A;  . [place]  . LEFT HEART CATH AND CORS/GRAFTS ANGIOGRAPHY N/A 06/20/2017   Procedure: LEFT HEART CATH AND CORS/GRAFTS ANGIOGRAPHY;  Surgeon: Martinique, Peter M, MD;  Location: [facility];  Service: Cardiovascular;  Laterality: N/A;  . ORIF ANKLE FRACTURE  08/26/2011   Procedure: OPEN REDUCTION INTERNAL FIXATION (ORIF) ANKLE FRACTURE;  Surgeon: Sanjuana Kava, MD;  Location: [facility];  Service: Orthopedics;  Laterality: Right;  . POLYPECTOMY  09/07/2017   Procedure: POLYPECTOMY;  Surgeon: Daneil Dolin, MD;  Location: [facility];  Service: Endoscopy;;  ascending x2 (cold snare)  . PROSTATE SURGERY  2012   Enlarged prostate  . TRANSURETHRAL RESECTION OF PROSTATE  2012     SOCIAL HISTORY:  Social History   Socioeconomic History  . Marital status: Married    Spouse name: Not on file  . Number of children: Not on file  . Years of education: Not on file  . Highest education level: Not on file  Occupational History  . Not on file  Social Needs  . Financial resource strain: Not on file  .  Food insecurity:    Worry: Not on file    Inability: Not on file  . Transportation needs:    Medical: Not on file    Non-medical: Not on file  Tobacco Use  . Smoking status: Former Smoker    Packs/day: 1.00    Years: 33.00    Pack years: 33.00    Last attempt to quit: 05/17/1987    Years since quitting: 31.3  . Smokeless tobacco: Never Used  Substance and Sexual Activity  . Alcohol use: No  . Drug use: No  . Sexual activity: Yes  Lifestyle  . Physical activity:    Days per week: Not on file    Minutes per session: Not on file  . Stress: Not on  file  Relationships  . Social connections:    Talks on phone: Not on file    Gets together: Not on file    Attends religious service: Not on file    Active member of club or organization: Not on file    Attends meetings of clubs or organizations: Not on file    Relationship status: Not on file  . Intimate partner violence:    Fear of current or ex partner: Not on file    Emotionally abused: Not on file    Physically abused: Not on file    Forced sexual activity: Not on file  Other Topics Concern  . Not on file  Social History Narrative  . Not on file    FAMILY HISTORY:  Family History  Problem Relation Age of Onset  . Aneurysm Mother   . Heart attack Father   . Colon cancer Maternal Uncle   . Cancer Brother   . Gastric cancer Neg Hx   . Esophageal cancer Neg Hx     CURRENT MEDICATIONS:  Outpatient Encounter Medications as of 09/13/2018  Medication Sig  . amLODipine (NORVASC) 10 MG tablet Take 1 tablet (10 mg total) by mouth daily.  Marland Kitchen aspirin EC 81 MG tablet Take 81 mg by mouth daily.  . Cholecalciferol (VITAMIN D3) 5000 units CAPS Take 5,000 Units by mouth daily.  Marland Kitchen dabrafenib mesylate (TAFINLAR) 50 MG capsule TAKE 2 CAPSULES (100 MG TOTAL) BY MOUTH 2 (TWO) TIMES DAILY. TAKE ON AN EMPTY STOMACH 1 HOUR BEFORE OR 2 HOURS AFTER MEALS.  Marland Kitchen escitalopram (LEXAPRO) 10 MG tablet   . glimepiride (AMARYL) 2 MG tablet Take 2 mg by mouth daily with breakfast.   . isosorbide mononitrate (IMDUR) 30 MG 24 hr tablet TAKE ONE TABLET (30MG TOTAL) BY MOUTH DAILY  . metFORMIN (GLUCOPHAGE) 500 MG tablet Take 1 tablet (500 mg total) by mouth 2 (two) times daily with a meal.  . metoprolol (LOPRESSOR) 100 MG tablet Take 100 mg by mouth 2 (two) times daily.  . nitroGLYCERIN (NITROSTAT) 0.4 MG SL tablet Place 0.4 mg under the tongue every 5 (five) minutes as needed for chest pain.   Marland Kitchen ondansetron (ZOFRAN) 8 MG tablet Take 1 tablet (8 mg total) by mouth every 8 (eight) hours as needed for nausea or  vomiting.  . ONE TOUCH ULTRA TEST test strip   . pioglitazone (ACTOS) 45 MG tablet Take 45 mg by mouth daily.  . prochlorperazine (COMPAZINE) 10 MG tablet Take 1 tablet (10 mg total) by mouth every 6 (six) hours as needed for nausea or vomiting.  . sitaGLIPtin (JANUVIA) 50 MG tablet Take 50 mg by mouth daily.   . trametinib dimethyl sulfoxide (MEKINIST) 0.5 MG tablet TAKE 2 TABLETS (  1 MG TOTAL) BY MOUTH DAILY. TAKE 1 HOUR BEFORE OR 2 HOURS AFTER A MEAL. STORE REFRIGERATED IN ORIGINAL CONTAINER.   No facility-administered encounter medications on file as of 09/13/2018.     ALLERGIES:  No Known Allergies   PHYSICAL EXAM:  ECOG Performance status: 1  Vitals:   09/13/18 1034  BP: (!) 129/52  Pulse: (!) 52  Resp: 18  Temp: 97.6 F (36.4 C)  SpO2: 99%   Filed Weights   09/13/18 1034  Weight: 189 lb (85.7 kg)    Physical Exam Vitals signs reviewed.  Constitutional:      Appearance: Normal appearance.  Cardiovascular:     Rate and Rhythm: Normal rate and regular rhythm.     Heart sounds: Normal heart sounds.  Pulmonary:     Effort: Pulmonary effort is normal.     Breath sounds: Normal breath sounds.  Abdominal:     General: There is no distension.     Palpations: Abdomen is soft. There is no mass.  Musculoskeletal:        General: No swelling.  Skin:    General: Skin is warm.  Neurological:     General: No focal deficit present.     Mental Status: He is alert and oriented to person, place, and time.  Psychiatric:        Mood and Affect: Mood normal.        Behavior: Behavior normal.      LABORATORY DATA:  I have reviewed the labs as listed.  CBC    Component Value Date/Time   WBC 4.9 09/11/2018 0932   RBC 4.53 09/11/2018 0932   HGB 12.4 (L) 09/11/2018 0932   HCT 36.9 (L) 09/11/2018 0932   PLT 181 09/11/2018 0932   MCV 81.5 09/11/2018 0932   MCH 27.4 09/11/2018 0932   MCHC 33.6 09/11/2018 0932   RDW 14.9 09/11/2018 0932   LYMPHSABS 1.2 09/11/2018 0932    MONOABS 0.7 09/11/2018 0932   EOSABS 0.0 09/11/2018 0932   BASOSABS 0.0 09/11/2018 0932   CMP Latest Ref Rng & Units 09/11/2018 07/31/2018 06/23/2018  Glucose 70 - 99 mg/dL 201(H) 176(H) 193(H)  BUN 8 - 23 mg/dL 25(H) 21 24(H)  Creatinine 0.61 - 1.24 mg/dL 1.68(H) 1.50(H) 1.50(H)  Sodium 135 - 145 mmol/L 131(L) 133(L) 132(L)  Potassium 3.5 - 5.1 mmol/L 4.4 4.9 4.9  Chloride 98 - 111 mmol/L 98 101 101  CO2 22 - 32 mmol/L '26 27 25  ' Calcium 8.9 - 10.3 mg/dL 8.8(L) 8.9 8.6(L)  Total Protein 6.5 - 8.1 g/dL 7.4 7.0 7.0  Total Bilirubin 0.3 - 1.2 mg/dL 0.8 0.5 0.5  Alkaline Phos 38 - 126 U/L 100 97 80  AST 15 - 41 U/L '19 18 18  ' ALT 0 - 44 U/L '15 15 13       ' DIAGNOSTIC IMAGING:  I have independently reviewed the scans and discussed with the patient.   I have reviewed Venita Lick LPN's note and agree with the documentation.  I personally performed a face-to-face visit, made revisions and my assessment and plan is as follows.    ASSESSMENT & PLAN:   Adenocarcinoma of lung, stage 4 ([place]) 1.  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  He has multiple bilateral lung nodules making him metastatic.  He had a colonoscopy which showed  multiple polyps which were tubular adenomas.  His functional status is decent at this time.  He does not have any malignancy related symptoms at this time.   -Dabrafenib 100 mg twice daily and trametinib 1.5 mg once daily (response rates of 65% and median PFS of 10.9 months) on an empty stomach was started on 10/07/2017.  He is not having any problems.  Upon further questioning and confirmation, we found out that he is taking dabrafenib 50 mg twice daily.  2D echocardiogram dated 10/28/2017 which shows ejection fraction of 60%.  I plan to repeat echocardiogram in 2  to 3 months.  He increased dabrafenib 100 mg twice daily on 11/10/2017.  So far he has tolerated very well.  He went to the ER on 11/13/2017 with right occipital pain which improved with injection.  He is taking Lyrica twice daily which is helping with the pain. -PET CT scan dated 12/19/2017 shows decrease in size of the lung nodules and SUV value. - PET/CT scan on 04/11/2018 showed decrease in size of innumerable scattered pulmonary nodules.  There is some increased conspicuity of a confluence of nodules in the left upper lobe with associated groundglass opacity.  This is a maximum SUV of 3, previously 1.9.  Given the configuration, this could be a focus of atypical bronchiolitis, with no new findings of metastatic disease. -  Echocardiogram on 03/20/2018 shows EF of 55%.  Echo on 10/28/2017 shows EF of 60%.  Baseline echo on 06/29/2017 shows EF of 60 to 65%. -Echocardiogram on 05/22/2018 shows EF of 55 to 60%. -PET CT scan on 07/31/2018 showed multiple bilateral small 5 to 10 mm pulmonary nodules throughout the lungs.  Number and size of nodules are not appreciably changed in the interval.  There is no associated metabolic activity.  No evidence of new or progressive disease. -He will continue dabrafenib 100 mg twice daily and trametinib 1 mg once daily. -I reviewed his echocardiogram dated 09/11/2018.  Ejection fraction is 50-55%.  This is mildly lower than before.  Hence we will plan to repeat echocardiogram in 2 months.  I will see him back and evaluate him in 1 month.   2.  Hypertension: -Blood pressure is is 129/52.  He will continue metoprolol and amlodipine.   3.  CKD: -His creatinine is stable around 1.6.    Total time spent is 25 minutes with more than 50% of the time spent face-to-face discussing treatment plan, lab results and coordination of care.    Orders placed this encounter:  Orders Placed This Encounter  Procedures  . CBC with Differential/Platelet  . Comprehensive metabolic panel  .  Magnesium      Derek Jack, MD [place] [phone]

## 2018-09-13 NOTE — Patient Instructions (Signed)
[facility] at [facility] Discharge Instructions  You were seen today by Dr. Katragadda. He went over your recent lab results. He will see you back in 2 months for labs and follow up.   Thank you for choosing [facility] at [facility] to provide your oncology and hematology care.  To afford each patient quality time with our provider, please arrive at least 15 minutes before your scheduled appointment time.   If you have a lab appointment with the Cancer Center please come in thru the  Main Entrance and check in at the main information desk  You need to re-schedule your appointment should you arrive 10 or more minutes late.  We strive to give you quality time with our providers, and arriving late affects you and other patients whose appointments are after yours.  Also, if you no show three or more times for appointments you may be dismissed from the clinic at the providers discretion.     Again, thank you for choosing [facility].  Our hope is that these requests will decrease the amount of time that you wait before being seen by our physicians.       _____________________________________________________________  Should you have questions after your visit to [facility], please contact our office at [phone] between the hours of 8:00 a.m. and 4:30 p.m.  Voicemails left after 4:00 p.m. will not be returned until the following business day.  For prescription refill requests, have your pharmacy contact our office and allow 72 hours.    Cancer Center Support Programs:   > Cancer Support Group  2nd Tuesday of the month 1pm-2pm, Journey Room    

## 2018-09-13 NOTE — Assessment & Plan Note (Signed)
1.  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  He has multiple bilateral lung nodules making him metastatic.  He had a colonoscopy which showed multiple polyps which were tubular adenomas.  His functional status is decent at this time.  He does not have any malignancy related symptoms at this time.   -Dabrafenib 100 mg twice daily and trametinib 1.5 mg once daily (response rates of 65% and median PFS of 10.9 months) on an empty stomach was started on 10/07/2017.  He is not having any problems.  Upon further questioning and confirmation, we found out that he is taking dabrafenib 50 mg twice daily.  2D echocardiogram dated 10/28/2017 which shows ejection fraction of 60%.  I plan to repeat echocardiogram in 2 to 3 months.  He increased dabrafenib 100 mg twice daily on 11/10/2017.  So far he has tolerated very well.  He went to the ER on 11/13/2017 with right occipital pain which improved with injection.  He is taking Lyrica twice daily which is helping with the pain. -PET CT scan dated 12/19/2017 shows decrease in size of the lung nodules and SUV value. - PET/CT scan on 04/11/2018 showed decrease in size of innumerable scattered pulmonary nodules.  There is some increased conspicuity of a confluence of nodules in the left upper lobe with associated groundglass opacity.  This is a maximum SUV of 3, previously 1.9.  Given the configuration, this could be a focus of atypical bronchiolitis, with no new findings of metastatic disease. -  Echocardiogram on 03/20/2018 shows EF of 55%.  Echo on 10/28/2017 shows EF of 60%.  Baseline echo on 06/29/2017 shows EF of 60 to 65%. -Echocardiogram on 05/22/2018 shows EF of 55 to 60%. -PET CT scan on 07/31/2018 showed multiple bilateral small 5 to 10 mm pulmonary nodules throughout the  lungs.  Number and size of nodules are not appreciably changed in the interval.  There is no associated metabolic activity.  No evidence of new or progressive disease. -He will continue dabrafenib 100 mg twice daily and trametinib 1 mg once daily. -I reviewed his echocardiogram dated 09/11/2018.  Ejection fraction is 50-55%.  This is mildly lower than before.  Hence we will plan to repeat echocardiogram in 2 months.  I will see him back and evaluate him in 1 month.   2.  Hypertension: -Blood pressure is is 129/52.  He will continue metoprolol and amlodipine.   3.  CKD: -His creatinine is stable around 1.6.

## 2018-10-13 ENCOUNTER — Telehealth: Payer: Self-pay | Admitting: Cardiovascular Disease

## 2018-10-13 NOTE — Telephone Encounter (Signed)
Virtual Visit Pre-Appointment Phone Call  "(Name), I am calling you today to discuss your upcoming appointment. We are currently trying to limit exposure to the virus that causes COVID-19 by seeing patients at home rather than in the office."  1. "What is the BEST phone number to call the day of the visit?" - include this in appointment notes  2. Do you have or have access to (through a family member/friend) a smartphone with video capability that we can use for your visit?" a. If yes - list this number in appt notes as cell (if different from BEST phone #) and list the appointment type as a VIDEO visit in appointment notes b. If no - list the appointment type as a PHONE visit in appointment notes  3. Confirm consent - "In the setting of the current Covid19 crisis, you are scheduled for a (phone or video) visit with your provider on (date) at (time).  Just as we do with many in-office visits, in order for you to participate in this visit, we must obtain consent.  If you'd like, I can send this to your mychart (if signed up) or email for you to review.  Otherwise, I can obtain your verbal consent now.  All virtual visits are billed to your insurance company just like a normal visit would be.  By agreeing to a virtual visit, we'd like you to understand that the technology does not allow for your provider to perform an examination, and thus may limit your provider's ability to fully assess your condition. If your provider identifies any concerns that need to be evaluated in person, we will make arrangements to do so.  Finally, though the technology is pretty good, we cannot assure that it will always work on either your or our end, and in the setting of a video visit, we may have to convert it to a phone-only visit.  In either situation, we cannot ensure that we have a secure connection.  Are you willing to proceed?" STAFF: Did the patient verbally acknowledge consent to telehealth visit? Document  YES/NO here: Yes  4. Advise patient to be prepared - "Two hours prior to your appointment, go ahead and check your blood pressure, pulse, oxygen saturation, and your weight (if you have the equipment to check those) and write them all down. When your visit starts, your provider will ask you for this information. If you have an Apple Watch or Kardia device, please plan to have heart rate information ready on the day of your appointment. Please have a pen and paper handy nearby the day of the visit as well."  5. Give patient instructions for MyChart download to smartphone OR [email or web address] as below if video visit (depending on what platform provider is using)  6. Inform patient they will receive a phone call 15 minutes prior to their appointment time (may be from unknown caller ID) so they should be prepared to answer    TELEPHONE CALL NOTE  [name] has been deemed a candidate for a follow-up tele-health visit to limit community exposure during the Covid-19 pandemic. I spoke with the patient via phone to ensure availability of phone/video source, confirm preferred email & phone number, and discuss instructions and expectations.  I reminded [name] to be prepared with any vital sign and/or heart rhythm information that could potentially be obtained via home monitoring, at the time of his visit. I reminded [name] to expect a phone call prior to  his visit.  Orinda Kenner 10/13/2018 12:50 PM

## 2018-10-16 ENCOUNTER — Telehealth (INDEPENDENT_AMBULATORY_CARE_PROVIDER_SITE_OTHER): Payer: Medicare Other | Admitting: Cardiovascular Disease

## 2018-10-16 ENCOUNTER — Other Ambulatory Visit: Payer: Self-pay

## 2018-10-16 ENCOUNTER — Encounter: Payer: Self-pay | Admitting: Cardiovascular Disease

## 2018-10-16 VITALS — BP 120/62 | HR 56 | Temp 98.0°F | Ht 71.0 in | Wt 190.0 lb

## 2018-10-16 DIAGNOSIS — N183 Chronic kidney disease, stage 3 unspecified: Secondary | ICD-10-CM

## 2018-10-16 DIAGNOSIS — I25708 Atherosclerosis of coronary artery bypass graft(s), unspecified, with other forms of angina pectoris: Secondary | ICD-10-CM

## 2018-10-16 DIAGNOSIS — C3490 Malignant neoplasm of unspecified part of unspecified bronchus or lung: Secondary | ICD-10-CM

## 2018-10-16 DIAGNOSIS — I10 Essential (primary) hypertension: Secondary | ICD-10-CM

## 2018-10-16 NOTE — Patient Instructions (Signed)
Medication Instructions: Your physician recommends that you continue on your current medications as directed. Please refer to the Current Medication list given to you today.   Labwork: none  Procedures/Testing: none  Follow-Up: 6 months with Dr.Koneswaran  Any Additional Special Instructions Will Be Listed Below (If Applicable).     If you need a refill on your cardiac medications before your next appointment, please call your pharmacy.     Thank you for choosing [facility] !

## 2018-10-16 NOTE — Progress Notes (Signed)
Virtual Visit via Telephone Note   This visit type was conducted due to national recommendations for restrictions regarding the COVID-19 Pandemic (e.g. social distancing) in an effort to limit this patient's exposure and mitigate transmission in our community.  Due to his co-morbid illnesses, this patient is at least at moderate risk for complications without adequate follow up.  This format is felt to be most appropriate for this patient at this time.  The patient did not have access to video technology/had technical difficulties with video requiring transitioning to audio format only (telephone).  All issues noted in this document were discussed and addressed.  No physical exam could be performed with this format.  Please refer to the patient's chart for his  consent to telehealth for [facility].   Date:  10/16/2018   ID:  [name], DOB 09-Apr-1941, MRN [id number]  Patient Location: Home Provider Location: Office  PCP:  [name], MD  Cardiologist:  Kate Sable, MD  Electrophysiologist:  None   Evaluation Performed:  Follow-Up Visit  Chief Complaint:  CAD  History of Present Illness:    [name] is a 78 y.o. male with coronary artery disease.  He underwent coronary angiography in February 2019 which demonstratedsevere native three-vessel obstructive coronary disease, patent LIMA to the LAD, and severe SVG disease with a 99% proximal SVG stenosis to the diagonal with TIMI I flow and multiple high-grade lesions in the SVG to the OM and SVG to the RCA. Dr. Martinique recommended consideration for redo CABG. He was evaluated by Dr. 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  Echocardiogram on6/14/2019 showed normal left ventricular systolic function and regional wall motion, LVEF 60%. There was mild mitral and tricuspid regurgitation with mildly reduced right ventricular  systolic function, pulmonary pressures 41 mmHg.  He previously had some lightheadedness if he stood up too quickly. This has resolved within the past 1-2 weeks. He mowed and did weed eating today. He denies chest pain. Chronic exertional dyspnea is stable.  He previously told me statins in the past led to diarrhea. His PCP tried several different agents.  The patient does not have symptoms concerning for COVID-19 infection (fever, chills, cough, or new shortness of breath).   Social history:He enjoys working with his hands and working in his shop in his backyard. 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 He has 7thgrade education because he had a dropout as his father got sick and he had to take care of the farm. He used to read his sisters'books and study.   Past Medical History:  Diagnosis Date  . Cancer ([place])   . Chronic kidney disease   . Coronary artery disease   . Diabetes mellitus   . Hypertension   . 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 Spontaneous pneumothorax 09/15/2010   right   Past Surgical History:  Procedure Laterality Date  . ANKLE FRACTURE SURGERY  2008   [facility]  . APPENDECTOMY  1960's  . CHEST TUBE INSERTION Right 09/15/2010   Dr Servando Snare - spontaneous PTX  . CHOLECYSTECTOMY  2008  . COLONOSCOPY N/A 09/07/2017   Procedure: COLONOSCOPY;  Surgeon: Daneil Dolin, MD;  Location: [facility] ENDO SU[facility];  Service: Endoscopy;  Laterality: N/A;  10:30am  . CORONARY ARTERY BYPASS GRAFT  2003  . EYE SURGERY  2001   Cataracts removed bilaterally  . INCISIONAL HERNIA REPAIR N/A 01/29/2015   Procedure: Fatima Blank HERNIORRHAPHY WITH MESH;  Surgeon: Aviva Signs, MD;  Location: [facility] ORS;  Service: General;  Laterality: N/A;  . INSERTION OF MESH N/A 01/29/2015   Procedure: INSERTION OF MESH;  Surgeon: Aviva Signs, MD;  Location: [facility] ORS;  Service: General;  Laterality: N/A;  . [place]  . LEFT HEART CATH AND CORS/GRAFTS ANGIOGRAPHY N/A 06/20/2017   Procedure: LEFT HEART CATH AND CORS/GRAFTS ANGIOGRAPHY;  Surgeon: Martinique, Peter M, MD;  Location: [place] CV LAB;  Service: Cardiovascular;  Laterality: N/A;  . ORIF ANKLE FRACTURE  08/26/2011   Procedure: OPEN REDUCTION INTERNAL FIXATION (ORIF) ANKLE FRACTURE;  Surgeon: Sanjuana Kava, MD;  Location: [facility] ORS;  Service: Orthopedics;  Laterality: Right;  . POLYPECTOMY  09/07/2017   Procedure: POLYPECTOMY;  Surgeon: Daneil Dolin, MD;  Location: [facility] END[facility];  Service: Endoscopy;;  ascending x2 (cold snare)  . PROSTATE SURGERY  2012   Enlarged prostate  . TRANSURETHRAL RESECTION OF PROSTATE  2012     Current Meds  Medication Sig  . amLODipine (NORVASC) 10 MG tablet Take 1 tablet (10 mg total) by mouth daily.  Marland Kitchen aspirin EC 81 MG tablet Take 81 mg by mouth daily.  . Cholecalciferol (VITAMIN D3) 5000 units CAPS Take 5,000 Units by mouth daily.  Marland Kitchen dabrafenib mesylate (TAFINLAR) 50 MG capsule TAKE 2 CAPSULES (100 MG TOTAL) BY MOUTH 2 (TWO) TIMES DAILY. TAKE ON AN EMPTY STOMACH 1 HOUR BEFORE OR 2 HOURS AFTER MEALS.  Marland Kitchen escitalopram (LEXAPRO) 10 MG tablet   . glimepiride (AMARYL) 2 MG tablet Take 2 mg by mouth daily with breakfast.   . isosorbide mononitrate (IMDUR) 30 MG 24 hr tablet TAKE ONE TABLET (30MG  TOTAL) BY MOUTH DAILY  . metFORMIN (GLUCOPHAGE) 500 MG tablet Take 1 tablet (500 mg total) by mouth 2 (two) times daily with a meal.  . metoprolol (LOPRESSOR) 100 MG tablet Take 100 mg by mouth 2 (two) times daily.  . nitroGLYCERIN (NITROSTAT) 0.4 MG SL tablet Place 0.4 mg under the tongue every 5 (five) minutes as needed for chest pain.   . ONE TOUCH ULTRA TEST test strip   . pioglitazone (ACTOS) 45 MG tablet Take 45 mg by mouth daily.  . sitaGLIPtin (JANUVIA) 50 MG tablet Take 50 mg by mouth daily.   . trametinib  dimethyl sulfoxide (MEKINIST) 0.5 MG tablet TAKE 2 TABLETS (1 MG TOTAL) BY MOUTH DAILY. TAKE 1 HOUR BEFORE OR 2 HOURS AFTER A MEAL. STORE REFRIGERATED IN ORIGINAL CONTAINER.     Allergies:   Patient has no known allergies.   Social History   Tobacco Use  . Smoking status: Former Smoker    Packs/day: 1.00    Years: 33.00    Pack years: 33.00    Last attempt to quit: 05/17/1987    Years since quitting: 31.4  . Smokeless tobacco: Never Used  Substance Use Topics  . Alcohol use: No  . Drug use: No     Family Hx: The patient's family history includes Aneurysm in his mother; Cancer in his brother; Colon cancer in his maternal uncle; Heart attack in his father. There is no history of Gastric cancer or Esophageal cancer.  ROS:   Please see the history of present  illness.     All other systems reviewed and are negative.   Prior CV studies:   The following studies were reviewed today:  See above  Labs/Other Tests and Data Reviewed:    EKG:  No ECG reviewed.  Recent Labs: 07/31/2018: Magnesium 1.9 09/11/2018: ALT 15; BUN 25; Creatinine, Ser 1.68; Hemoglobin 12.4; Platelets 181; Potassium 4.4; Sodium 131   Recent Lipid Panel No results found for: CHOL, TRIG, HDL, CHOLHDL, LDLCALC, LDLDIRECT  Wt Readings from Last 3 Encounters:  10/16/18 190 lb (86.2 kg)  09/13/18 189 lb (85.7 kg)  08/04/18 188 lb 14.4 oz (85.7 kg)     Objective:    Vital Signs:  BP 120/62   Pulse (!) 56   Temp 98 F (36.7 C)   Ht 5\' 11"  (1.803 m)   Wt 190 lb (86.2 kg)   BMI 26.50 kg/m    VITAL SIGNS:  reviewed  ASSESSMENT & PLAN:    1. Coronary artery disease with history of four-vessel CABG and severe SVG disease with concomitant angina pectoris:Symptomatically stable. Continue aspirin, Imdur30mg ,and metoprolol. Dr. Martinique recommended redo CABGbut is deemed not to be an operative candidate given his metastatic adenocarcinoma of the lung. He will be medically managed. He is statin  intolerant.  I will obtain a copy of lipids from PCP.  2. Hypertension: Blood pressure is normal. He developed hyperkalemia with ACE inhibitors in the past.His oncologist previouslyadded amlodipine 10 mg daily as both dabrafeniband trametinib both cause hypertension.  3. Chronic kidney disease stage III: BUN 19, creatinine 1.58on1/10/2018. This has remained stable.  4. Stage IV metastatic adenocarcinoma of the lung: Follows with oncology.   COVID-19 Education: The signs and symptoms of COVID-19 were discussed with the patient and how to seek care for testing (follow up with PCP or arrange E-visit).  The importance of social distancing was discussed today.  Time:   Today, I have spent 15 minutes with the patient with telehealth technology discussing the above problems.     Medication Adjustments/Labs and Tests Ordered: Current medicines are reviewed at length with the patient today.  Concerns regarding medicines are outlined above.   Tests Ordered: No orders of the defined types were placed in this encounter.   Medication Changes: No orders of the defined types were placed in this encounter.   Disposition:  Follow up in 6 month(s)  Signed, Kate Sable, MD  10/16/2018 12:55 PM    [place]

## 2018-10-17 ENCOUNTER — Other Ambulatory Visit: Payer: Self-pay

## 2018-10-17 ENCOUNTER — Inpatient Hospital Stay (HOSPITAL_COMMUNITY): Payer: Medicare Other | Attending: Hematology

## 2018-10-17 DIAGNOSIS — N189 Chronic kidney disease, unspecified: Secondary | ICD-10-CM | POA: Diagnosis not present

## 2018-10-17 DIAGNOSIS — Z87891 Personal history of nicotine dependence: Secondary | ICD-10-CM | POA: Insufficient documentation

## 2018-10-17 DIAGNOSIS — Z79899 Other long term (current) drug therapy: Secondary | ICD-10-CM | POA: Diagnosis not present

## 2018-10-17 DIAGNOSIS — C3411 Malignant neoplasm of upper lobe, right bronchus or lung: Secondary | ICD-10-CM | POA: Insufficient documentation

## 2018-10-17 DIAGNOSIS — Z800 Family history of malignant neoplasm of digestive organs: Secondary | ICD-10-CM | POA: Diagnosis not present

## 2018-10-17 DIAGNOSIS — C3490 Malignant neoplasm of unspecified part of unspecified bronchus or lung: Secondary | ICD-10-CM

## 2018-10-17 DIAGNOSIS — I129 Hypertensive chronic kidney disease with stage 1 through stage 4 chronic kidney disease, or unspecified chronic kidney disease: Secondary | ICD-10-CM | POA: Diagnosis not present

## 2018-10-17 LAB — COMPREHENSIVE METABOLIC PANEL
ALT: 15 U/L (ref 0–44)
AST: 18 U/L (ref 15–41)
Albumin: 3.7 g/dL (ref 3.5–5.0)
Alkaline Phosphatase: 93 U/L (ref 38–126)
Anion gap: 9 (ref 5–15)
BUN: 18 mg/dL (ref 8–23)
CO2: 24 mmol/L (ref 22–32)
Calcium: 8.9 mg/dL (ref 8.9–10.3)
Chloride: 101 mmol/L (ref 98–111)
Creatinine, Ser: 1.6 mg/dL — ABNORMAL HIGH (ref 0.61–1.24)
GFR calc Af Amer: 47 mL/min — ABNORMAL LOW (ref >60)
GFR calc non Af Amer: 40 mL/min — ABNORMAL LOW (ref >60)
Glucose, Bld: 199 mg/dL — ABNORMAL HIGH (ref 70–99)
Potassium: 4.8 mmol/L (ref 3.5–5.1)
Sodium: 134 mmol/L — ABNORMAL LOW (ref 135–145)
Total Bilirubin: 0.5 mg/dL (ref 0.3–1.2)
Total Protein: 7.4 g/dL (ref 6.5–8.1)

## 2018-10-17 LAB — CBC WITH DIFFERENTIAL/PLATELET
Abs Immature Granulocytes: 0.03 10*3/uL (ref 0.00–0.07)
Basophils Absolute: 0 10*3/uL (ref 0.0–0.1)
Basophils Relative: 1 %
Eosinophils Absolute: 0.1 10*3/uL (ref 0.0–0.5)
Eosinophils Relative: 1 %
HCT: 38.1 % — ABNORMAL LOW (ref 39.0–52.0)
Hemoglobin: 12.4 g/dL — ABNORMAL LOW (ref 13.0–17.0)
Immature Granulocytes: 1 %
Lymphocytes Relative: 25 %
Lymphs Abs: 1.5 10*3/uL (ref 0.7–4.0)
MCH: 26.7 pg (ref 26.0–34.0)
MCHC: 32.5 g/dL (ref 30.0–36.0)
MCV: 82.1 fL (ref 80.0–100.0)
Monocytes Absolute: 0.6 10*3/uL (ref 0.1–1.0)
Monocytes Relative: 10 %
Neutro Abs: 3.9 10*3/uL (ref 1.7–7.7)
Neutrophils Relative %: 62 %
Platelets: 208 10*3/uL (ref 150–400)
RBC: 4.64 MIL/uL (ref 4.22–5.81)
RDW: 15 % (ref 11.5–15.5)
WBC: 6.2 10*3/uL (ref 4.0–10.5)
nRBC: 0 % (ref 0.0–0.2)

## 2018-10-17 LAB — MAGNESIUM: Magnesium: 1.9 mg/dL (ref 1.7–2.4)

## 2018-10-20 ENCOUNTER — Other Ambulatory Visit: Payer: Self-pay

## 2018-10-23 ENCOUNTER — Encounter (HOSPITAL_COMMUNITY): Payer: Self-pay | Admitting: Hematology

## 2018-10-23 ENCOUNTER — Inpatient Hospital Stay (HOSPITAL_BASED_OUTPATIENT_CLINIC_OR_DEPARTMENT_OTHER): Payer: Medicare Other | Admitting: Hematology

## 2018-10-23 ENCOUNTER — Other Ambulatory Visit: Payer: Self-pay

## 2018-10-23 DIAGNOSIS — Z800 Family history of malignant neoplasm of digestive organs: Secondary | ICD-10-CM

## 2018-10-23 DIAGNOSIS — Z87891 Personal history of nicotine dependence: Secondary | ICD-10-CM

## 2018-10-23 DIAGNOSIS — I129 Hypertensive chronic kidney disease with stage 1 through stage 4 chronic kidney disease, or unspecified chronic kidney disease: Secondary | ICD-10-CM

## 2018-10-23 DIAGNOSIS — N189 Chronic kidney disease, unspecified: Secondary | ICD-10-CM | POA: Diagnosis not present

## 2018-10-23 DIAGNOSIS — C3490 Malignant neoplasm of unspecified part of unspecified bronchus or lung: Secondary | ICD-10-CM

## 2018-10-23 DIAGNOSIS — Z79899 Other long term (current) drug therapy: Secondary | ICD-10-CM | POA: Diagnosis not present

## 2018-10-23 DIAGNOSIS — C3411 Malignant neoplasm of upper lobe, right bronchus or lung: Secondary | ICD-10-CM | POA: Diagnosis not present

## 2018-10-23 NOTE — Patient Instructions (Signed)
[facility] Discharge Instructions  You were seen today by Dr. Katragadda. He went over your recent lab results. He will see you back in 4 weeks for labs and follow up.   Thank you for choosing [facility] at [facility] to provide your oncology and hematology care.  To afford each patient quality time with our provider, please arrive at least 15 minutes before your scheduled appointment time.   If you have a lab appointment with the Cancer Center please come in thru the  Main Entrance and check in at the main information desk  You need to re-schedule your appointment should you arrive 10 or more minutes late.  We strive to give you quality time with our providers, and arriving late affects you and other patients whose appointments are after yours.  Also, if you no show three or more times for appointments you may be dismissed from the clinic at the providers discretion.     Again, thank you for choosing [facility].  Our hope is that these requests will decrease the amount of time that you wait before being seen by our physicians.       _____________________________________________________________  Should you have questions after your visit to [facility], please contact our office at [phone] between the hours of 8:00 a.m. and 4:30 p.m.  Voicemails left after 4:00 p.m. will not be returned until the following business day.  For prescription refill requests, have your pharmacy contact our office and allow 72 hours.    Cancer Center Support Programs:   > Cancer Support Group  2nd Tuesday of the month 1pm-2pm, Journey Room    

## 2018-10-23 NOTE — Progress Notes (Signed)
[name], [place] [id number]   CLINIC:  Medical Oncology/Hematology  PCP:  [name], MD [address] [place] [address] [phone]   REASON FOR VISIT:  Follow-up for NSCLC     INTERVAL HISTORY:  [name] 78 y.o. male returns for routine follow-up.  He reports that he has been taking his medications without fail.  Denies any symptoms of PND or orthopnea.  Denies any headaches.  Denies any nausea, vomiting, or diarrhea. Denies any new pains. Had not noticed any recent bleeding such as epistaxis, hematuria or hematochezia. Denies recent chest pain on exertion, shortness of breath on minimal exertion, pre-syncopal episodes, or palpitations. Denies any numbness or tingling in hands or feet. Denies any recent fevers, infections, or recent hospitalizations. Patient reports appetite at 100 % and energy level at 50 %.   REVIEW OF SYSTEMS:  Review of Systems  All other systems reviewed and are negative.    PAST MEDICAL/SURGICAL HISTORY:  Past Medical History:  Diagnosis Date  . Cancer ([place])   . Chronic kidney disease   . Coronary artery disease   . Diabetes mellitus   . Hypertension   . 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 Spontaneous pneumothorax 09/15/2010   right   Past Surgical History:  Procedure Laterality Date  . ANKLE FRACTURE SURGERY  2008   [facility]  . APPENDECTOMY  1960's  . CHEST TUBE INSERTION Right 09/15/2010   Dr Servando Snare - spontaneous PTX  . CHOLECYSTECTOMY  2008  . COLONOSCOPY N/A 09/07/2017   Procedure: COLONOSCOPY;  Surgeon: Daneil Dolin, MD;  Location: [facility];  Service: Endoscopy;  Laterality: N/A;  10:30am  . CORONARY ARTERY BYPASS GRAFT  2003  . EYE SURGERY  2001   Cataracts removed bilaterally  . INCISIONAL HERNIA REPAIR N/A 01/29/2015   Procedure: Fatima Blank HERNIORRHAPHY  WITH MESH;  Surgeon: Aviva Signs, MD;  Location: [facility] ORS;  Service: General;  Laterality: N/A;  . INSERTION OF MESH N/A 01/29/2015   Procedure: INSERTION OF MESH;  Surgeon: Aviva Signs, MD;  Location: [facility] ORS;  Service: General;  Laterality: N/A;  . [place]  . LEFT HEART CATH AND CORS/GRAFTS ANGIOGRAPHY N/A 06/20/2017   Procedure: LEFT HEART CATH AND CORS/GRAFTS ANGIOGRAPHY;  Surgeon: Martinique, Peter M, MD;  Location: [place] CV LAB;  Service: Cardiovascular;  Laterality: N/A;  . ORIF ANKLE FRACTURE  08/26/2011   Procedure: OPEN REDUCTION INTERNAL FIXATION (ORIF) ANKLE FRACTURE;  Surgeon: Sanjuana Kava, MD;  Location: [facility] ORS;  Service: Orthopedics;  Laterality: Right;  . POLYPECTOMY  09/07/2017   Procedure: POLYPECTOMY;  Surgeon: Daneil Dolin, MD;  Location: [facility];  Service: Endoscopy;;  ascending x2 (cold snare)  . PROSTATE SURGERY  2012   Enlarged prostate  . TRANSURETHRAL RESECTION OF PROSTATE  2012     SOCIAL HISTORY:  Social History   Socioeconomic History  . Marital status: Married    Spouse name: Not on file  . Number of children: Not on file  . Years of education: Not on file  . Highest education level: Not on file  Occupational History  . Not on file  Social Needs  . Financial resource strain: Not on file  . Food insecurity:    Worry: Not on file  Inability: Not on file  . Transportation needs:    Medical: Not on file    Non-medical: Not on file  Tobacco Use  . Smoking status: Former Smoker    Packs/day: 1.00    Years: 33.00    Pack years: 33.00    Last attempt to quit: 05/17/1987    Years since quitting: 31.4  . Smokeless tobacco: Never Used  Substance and Sexual Activity  . Alcohol use: No  . Drug use: No  . Sexual activity: Yes  Lifestyle  . Physical activity:    Days per week: Not on file    Minutes per session: Not on file  . Stress: Not on file  Relationships  . Social connections:    Talks on phone:  Not on file    Gets together: Not on file    Attends religious service: Not on file    Active member of club or organization: Not on file    Attends meetings of clubs or organizations: Not on file    Relationship status: Not on file  . Intimate partner violence:    Fear of current or ex partner: Not on file    Emotionally abused: Not on file    Physically abused: Not on file    Forced sexual activity: Not on file  Other Topics Concern  . Not on file  Social History Narrative  . Not on file    FAMILY HISTORY:  Family History  Problem Relation Age of Onset  . Aneurysm Mother   . Heart attack Father   . Colon cancer Maternal Uncle   . Cancer Brother   . Gastric cancer Neg Hx   . Esophageal cancer Neg Hx     CURRENT MEDICATIONS:  Outpatient Encounter Medications as of 10/23/2018  Medication Sig  . amLODipine (NORVASC) 10 MG tablet Take 1 tablet (10 mg total) by mouth daily.  Marland Kitchen aspirin EC 81 MG tablet Take 81 mg by mouth daily.  . Cholecalciferol (VITAMIN D3) 5000 units CAPS Take 5,000 Units by mouth daily.  Marland Kitchen dabrafenib mesylate (TAFINLAR) 50 MG capsule TAKE 2 CAPSULES (100 MG TOTAL) BY MOUTH 2 (TWO) TIMES DAILY. TAKE ON AN EMPTY STOMACH 1 HOUR BEFORE OR 2 HOURS AFTER MEALS.  Marland Kitchen escitalopram (LEXAPRO) 10 MG tablet   . glimepiride (AMARYL) 2 MG tablet Take 2 mg by mouth daily with breakfast.   . isosorbide mononitrate (IMDUR) 30 MG 24 hr tablet TAKE ONE TABLET (30MG TOTAL) BY MOUTH DAILY  . metFORMIN (GLUCOPHAGE) 500 MG tablet Take 1 tablet (500 mg total) by mouth 2 (two) times daily with a meal.  . metoprolol (LOPRESSOR) 100 MG tablet Take 100 mg by mouth 2 (two) times daily.  . nitroGLYCERIN (NITROSTAT) 0.4 MG SL tablet Place 0.4 mg under the tongue every 5 (five) minutes as needed for chest pain.   . ONE TOUCH ULTRA TEST test strip   . pioglitazone (ACTOS) 45 MG tablet Take 45 mg by mouth daily.  . sitaGLIPtin (JANUVIA) 50 MG tablet Take 50 mg by mouth daily.   . trametinib  dimethyl sulfoxide (MEKINIST) 0.5 MG tablet TAKE 2 TABLETS (1 MG TOTAL) BY MOUTH DAILY. TAKE 1 HOUR BEFORE OR 2 HOURS AFTER A MEAL. STORE REFRIGERATED IN ORIGINAL CONTAINER.  Marland Kitchen ondansetron (ZOFRAN) 8 MG tablet Take 1 tablet (8 mg total) by mouth every 8 (eight) hours as needed for nausea or vomiting. (Patient not taking: Reported on 10/16/2018)  . prochlorperazine (COMPAZINE) 10 MG tablet Take 1 tablet (  10 mg total) by mouth every 6 (six) hours as needed for nausea or vomiting. (Patient not taking: Reported on 10/16/2018)   No facility-administered encounter medications on file as of 10/23/2018.     ALLERGIES:  No Known Allergies   PHYSICAL EXAM:  ECOG Performance status: 1  Vitals:   10/23/18 1014  BP: (!) 167/72  Pulse: 84  Resp: 18  Temp: 97.6 F (36.4 C)  SpO2: 99%   Filed Weights   10/23/18 1014  Weight: 193 lb 8 oz (87.8 kg)    Physical Exam Vitals signs reviewed.  Constitutional:      Appearance: Normal appearance.  Cardiovascular:     Rate and Rhythm: Normal rate and regular rhythm.     Heart sounds: Normal heart sounds.  Pulmonary:     Effort: Pulmonary effort is normal.     Breath sounds: Normal breath sounds.  Abdominal:     General: There is no distension.     Palpations: Abdomen is soft. There is no mass.  Musculoskeletal:        General: Swelling present.  Skin:    General: Skin is warm.  Neurological:     General: No focal deficit present.     Mental Status: He is alert and oriented to person, place, and time.  Psychiatric:        Mood and Affect: Mood normal.        Behavior: Behavior normal.      LABORATORY DATA:  I have reviewed the labs as listed.  CBC    Component Value Date/Time   WBC 6.2 10/17/2018 1025   RBC 4.64 10/17/2018 1025   HGB 12.4 (L) 10/17/2018 1025   HCT 38.1 (L) 10/17/2018 1025   PLT 208 10/17/2018 1025   MCV 82.1 10/17/2018 1025   MCH 26.7 10/17/2018 1025   MCHC 32.5 10/17/2018 1025   RDW 15.0 10/17/2018 1025    LYMPHSABS 1.5 10/17/2018 1025   MONOABS 0.6 10/17/2018 1025   EOSABS 0.1 10/17/2018 1025   BASOSABS 0.0 10/17/2018 1025   CMP Latest Ref Rng & Units 10/17/2018 09/11/2018 07/31/2018  Glucose 70 - 99 mg/dL 199(H) 201(H) 176(H)  BUN 8 - 23 mg/dL 18 25(H) 21  Creatinine 0.61 - 1.24 mg/dL 1.60(H) 1.68(H) 1.50(H)  Sodium 135 - 145 mmol/L 134(L) 131(L) 133(L)  Potassium 3.5 - 5.1 mmol/L 4.8 4.4 4.9  Chloride 98 - 111 mmol/L 101 98 101  CO2 22 - 32 mmol/L '24 26 27  ' Calcium 8.9 - 10.3 mg/dL 8.9 8.8(L) 8.9  Total Protein 6.5 - 8.1 g/dL 7.4 7.4 7.0  Total Bilirubin 0.3 - 1.2 mg/dL 0.5 0.8 0.5  Alkaline Phos 38 - 126 U/L 93 100 97  AST 15 - 41 U/L '18 19 18  ' ALT 0 - 44 U/L '15 15 15       ' DIAGNOSTIC IMAGING:  I have independently reviewed the scans and discussed with the patient.   I have reviewed Venita Lick LPN's note and agree with the documentation.  I personally performed a face-to-face visit, made revisions and my assessment and plan is as follows.    ASSESSMENT & PLAN:   Adenocarcinoma of lung, stage 4 ([place]) 1.  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  He has multiple bilateral lung nodules making him metastatic.  He had a colonoscopy which  showed multiple polyps which were tubular adenomas.  His functional status is decent at this time.  He does not have any malignancy related symptoms at this time.   -Dabrafenib 100 mg twice daily and trametinib 1.5 mg once daily (response rates of 65% and median PFS of 10.9 months) on an empty stomach was started on 10/07/2017.  He is not having any problems.  Upon further questioning and confirmation, we found out that he is taking dabrafenib 50 mg twice daily.  2D echocardiogram dated 10/28/2017 which shows ejection fraction of 60%.  I plan to  repeat echocardiogram in 2 to 3 months.  He increased dabrafenib 100 mg twice daily on 11/10/2017.  So far he has tolerated very well.  He went to the ER on 11/13/2017 with right occipital pain which improved with injection.  He is taking Lyrica twice daily which is helping with the pain. -PET CT scan dated 12/19/2017 shows decrease in size of the lung nodules and SUV value. - PET/CT scan on 04/11/2018 showed decrease in size of innumerable scattered pulmonary nodules.  There is some increased conspicuity of a confluence of nodules in the left upper lobe with associated groundglass opacity.  This is a maximum SUV of 3, previously 1.9.  Given the configuration, this could be a focus of atypical bronchiolitis, with no new findings of metastatic disease.  - Baseline echo on 06/29/2017 shows EF of 60 to 65%.  Echo on 10/28/2017 shows EF of 60%.  Echo on 03/20/2018 shows EF of 55%.  Echo on 05/22/2018 shows EF of 55 to 60%. -PET CT scan on 07/31/2018 showed multiple bilateral small 5 to 10 mm pulmonary nodules throughout the lungs.  Number and size of nodules are not appreciably changed in the interval.  There is no associated metabolic activity.  No evidence of new or progressive disease. -2D echocardiogram on 09/11/2018 shows EF of 50 to 55%. -He is taking dabrafenib 100 mg twice daily and trametinib 1 mg tablet once daily. - I plan to repeat his echocardiogram in 4 weeks.  I will also repeat PET CT scan prior to next visit in 4 weeks.  2.  Hypertension: -Blood pressure today is slightly high at 167/72.  He is taking metoprolol and amlodipine.  He is not missing any doses. -We will do close monitoring.  3.  CKD: -His creatinine last week was stable around 1.6.    Total time spent is 25 minutes with more than 50% of the time spent face-to-face discussing treatment plan and coordination of care.    Orders placed this encounter:  Orders Placed This Encounter  Procedures  . NM PET Image Restag (PS) Skull Base  To Thigh  . ECHOCARDIOGRAM COMPLETE      Derek Jack, [place] [phone]

## 2018-10-23 NOTE — Assessment & Plan Note (Addendum)
1.  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  He has multiple bilateral lung nodules making him metastatic.  He had a colonoscopy which showed multiple polyps which were tubular adenomas.  His functional status is decent at this time.  He does not have any malignancy related symptoms at this time.   -Dabrafenib 100 mg twice daily and trametinib 1.5 mg once daily (response rates of 65% and median PFS of 10.9 months) on an empty stomach was started on 10/07/2017.  He is not having any problems.  Upon further questioning and confirmation, we found out that he is taking dabrafenib 50 mg twice daily.  2D echocardiogram dated 10/28/2017 which shows ejection fraction of 60%.  I plan to repeat echocardiogram in 2 to 3 months.  He increased dabrafenib 100 mg twice daily on 11/10/2017.  So far he has tolerated very well.  He went to the ER on 11/13/2017 with right occipital pain which improved with injection.  He is taking Lyrica twice daily which is helping with the pain. -PET CT scan dated 12/19/2017 shows decrease in size of the lung nodules and SUV value. - PET/CT scan on 04/11/2018 showed decrease in size of innumerable scattered pulmonary nodules.  There is some increased conspicuity of a confluence of nodules in the left upper lobe with associated groundglass opacity.  This is a maximum SUV of 3, previously 1.9.  Given the configuration, this could be a focus of atypical bronchiolitis, with no new findings of metastatic disease.  - Baseline echo on 06/29/2017 shows EF of 60 to 65%.  Echo on 10/28/2017 shows EF of 60%.  Echo on 03/20/2018 shows EF of 55%.  Echo on 05/22/2018 shows EF of 55 to 60%. -PET CT scan on 07/31/2018 showed multiple bilateral small 5 to 10 mm pulmonary nodules throughout the lungs.  Number and  size of nodules are not appreciably changed in the interval.  There is no associated metabolic activity.  No evidence of new or progressive disease. -2D echocardiogram on 09/11/2018 shows EF of 50 to 55%. -He is taking dabrafenib 100 mg twice daily and trametinib 1 mg tablet once daily. - I plan to repeat his echocardiogram in 4 weeks.  I will also repeat PET CT scan prior to next visit in 4 weeks.  2.  Hypertension: -Blood pressure today is slightly high at 167/72.  He is taking metoprolol and amlodipine.  He is not missing any doses. -We will do close monitoring.  3.  CKD: -His creatinine last week was stable around 1.6.

## 2018-10-30 DIAGNOSIS — I2510 Atherosclerotic heart disease of native coronary artery without angina pectoris: Secondary | ICD-10-CM | POA: Diagnosis not present

## 2018-10-30 DIAGNOSIS — N183 Chronic kidney disease, stage 3 (moderate): Secondary | ICD-10-CM | POA: Diagnosis not present

## 2018-10-30 DIAGNOSIS — E1129 Type 2 diabetes mellitus with other diabetic kidney complication: Secondary | ICD-10-CM | POA: Diagnosis not present

## 2018-10-30 DIAGNOSIS — Z79899 Other long term (current) drug therapy: Secondary | ICD-10-CM | POA: Diagnosis not present

## 2018-10-31 ENCOUNTER — Other Ambulatory Visit (HOSPITAL_COMMUNITY): Payer: Self-pay | Admitting: *Deleted

## 2018-10-31 DIAGNOSIS — C3490 Malignant neoplasm of unspecified part of unspecified bronchus or lung: Secondary | ICD-10-CM

## 2018-10-31 MED ORDER — TRAMETINIB DIMETHYL SULFOXIDE 0.5 MG PO TABS: ORAL_TABLET | ORAL | 0 refills | Status: DC

## 2018-10-31 MED ORDER — DABRAFENIB MESYLATE 50 MG PO CAPS: ORAL_CAPSULE | ORAL | 0 refills | Status: DC

## 2018-11-06 DIAGNOSIS — E785 Hyperlipidemia, unspecified: Secondary | ICD-10-CM | POA: Diagnosis not present

## 2018-11-06 DIAGNOSIS — C3490 Malignant neoplasm of unspecified part of unspecified bronchus or lung: Secondary | ICD-10-CM | POA: Diagnosis not present

## 2018-11-06 DIAGNOSIS — N183 Chronic kidney disease, stage 3 (moderate): Secondary | ICD-10-CM | POA: Diagnosis not present

## 2018-11-06 DIAGNOSIS — E1122 Type 2 diabetes mellitus with diabetic chronic kidney disease: Secondary | ICD-10-CM | POA: Diagnosis not present

## 2018-11-06 DIAGNOSIS — I25721 Atherosclerosis of autologous artery coronary artery bypass graft(s) with angina pectoris with documented spasm: Secondary | ICD-10-CM | POA: Diagnosis not present

## 2018-11-07 ENCOUNTER — Other Ambulatory Visit (HOSPITAL_COMMUNITY): Payer: Self-pay | Admitting: *Deleted

## 2018-11-09 ENCOUNTER — Ambulatory Visit (HOSPITAL_COMMUNITY)
Admission: RE | Admit: 2018-11-09 | Discharge: 2018-11-09 | Disposition: A | Discharge status: Home / Self Care | Payer: Medicare Other | Source: Ambulatory Visit | Attending: Hematology | Admitting: Hematology

## 2018-11-09 ENCOUNTER — Other Ambulatory Visit (HOSPITAL_COMMUNITY): Payer: Self-pay | Admitting: *Deleted

## 2018-11-09 ENCOUNTER — Other Ambulatory Visit: Payer: Self-pay

## 2018-11-09 DIAGNOSIS — I272 Pulmonary hypertension, unspecified: Secondary | ICD-10-CM | POA: Insufficient documentation

## 2018-11-09 DIAGNOSIS — C3490 Malignant neoplasm of unspecified part of unspecified bronchus or lung: Secondary | ICD-10-CM

## 2018-11-09 DIAGNOSIS — E1122 Type 2 diabetes mellitus with diabetic chronic kidney disease: Secondary | ICD-10-CM | POA: Diagnosis not present

## 2018-11-09 DIAGNOSIS — I129 Hypertensive chronic kidney disease with stage 1 through stage 4 chronic kidney disease, or unspecified chronic kidney disease: Secondary | ICD-10-CM | POA: Insufficient documentation

## 2018-11-09 DIAGNOSIS — N183 Chronic kidney disease, stage 3 (moderate): Secondary | ICD-10-CM | POA: Diagnosis not present

## 2018-11-09 MED ORDER — TRAMETINIB DIMETHYL SULFOXIDE 0.5 MG PO TABS: ORAL_TABLET | ORAL | 0 refills | Status: DC

## 2018-11-09 MED ORDER — DABRAFENIB MESYLATE 50 MG PO CAPS: ORAL_CAPSULE | ORAL | 0 refills | Status: DC

## 2018-11-09 NOTE — Progress Notes (Signed)
*  PRELIMINARY RESULTS* Echocardiogram 2D Echocardiogram has been performed.  [name] 11/09/2018, 11:19 AM

## 2018-11-10 ENCOUNTER — Other Ambulatory Visit (HOSPITAL_COMMUNITY): Payer: Self-pay | Admitting: *Deleted

## 2018-11-10 DIAGNOSIS — C3490 Malignant neoplasm of unspecified part of unspecified bronchus or lung: Secondary | ICD-10-CM

## 2018-11-10 DIAGNOSIS — C801 Malignant (primary) neoplasm, unspecified: Secondary | ICD-10-CM

## 2018-11-13 ENCOUNTER — Other Ambulatory Visit: Payer: Self-pay

## 2018-11-13 ENCOUNTER — Inpatient Hospital Stay (HOSPITAL_COMMUNITY): Payer: Medicare Other

## 2018-11-13 DIAGNOSIS — Z79899 Other long term (current) drug therapy: Secondary | ICD-10-CM | POA: Diagnosis not present

## 2018-11-13 DIAGNOSIS — C3411 Malignant neoplasm of upper lobe, right bronchus or lung: Secondary | ICD-10-CM | POA: Diagnosis not present

## 2018-11-13 DIAGNOSIS — N189 Chronic kidney disease, unspecified: Secondary | ICD-10-CM | POA: Diagnosis not present

## 2018-11-13 DIAGNOSIS — C3490 Malignant neoplasm of unspecified part of unspecified bronchus or lung: Secondary | ICD-10-CM

## 2018-11-13 DIAGNOSIS — I129 Hypertensive chronic kidney disease with stage 1 through stage 4 chronic kidney disease, or unspecified chronic kidney disease: Secondary | ICD-10-CM | POA: Diagnosis not present

## 2018-11-13 DIAGNOSIS — C801 Malignant (primary) neoplasm, unspecified: Secondary | ICD-10-CM

## 2018-11-13 DIAGNOSIS — Z87891 Personal history of nicotine dependence: Secondary | ICD-10-CM | POA: Diagnosis not present

## 2018-11-13 DIAGNOSIS — Z800 Family history of malignant neoplasm of digestive organs: Secondary | ICD-10-CM | POA: Diagnosis not present

## 2018-11-13 LAB — COMPREHENSIVE METABOLIC PANEL
ALT: 11 U/L (ref 0–44)
AST: 14 U/L — ABNORMAL LOW (ref 15–41)
Albumin: 3.5 g/dL (ref 3.5–5.0)
Alkaline Phosphatase: 96 U/L (ref 38–126)
Anion gap: 10 (ref 5–15)
BUN: 21 mg/dL (ref 8–23)
CO2: 25 mmol/L (ref 22–32)
Calcium: 8.8 mg/dL — ABNORMAL LOW (ref 8.9–10.3)
Chloride: 96 mmol/L — ABNORMAL LOW (ref 98–111)
Creatinine, Ser: 1.65 mg/dL — ABNORMAL HIGH (ref 0.61–1.24)
GFR calc Af Amer: 45 mL/min — ABNORMAL LOW (ref >60)
GFR calc non Af Amer: 39 mL/min — ABNORMAL LOW (ref >60)
Glucose, Bld: 290 mg/dL — ABNORMAL HIGH (ref 70–99)
Potassium: 4.3 mmol/L (ref 3.5–5.1)
Sodium: 131 mmol/L — ABNORMAL LOW (ref 135–145)
Total Bilirubin: 0.8 mg/dL (ref 0.3–1.2)
Total Protein: 7.2 g/dL (ref 6.5–8.1)

## 2018-11-13 LAB — MAGNESIUM: Magnesium: 1.7 mg/dL (ref 1.7–2.4)

## 2018-11-13 LAB — CBC WITH DIFFERENTIAL/PLATELET
Abs Immature Granulocytes: 0.04 10*3/uL (ref 0.00–0.07)
Basophils Absolute: 0 10*3/uL (ref 0.0–0.1)
Basophils Relative: 0 %
Eosinophils Absolute: 0.1 10*3/uL (ref 0.0–0.5)
Eosinophils Relative: 1 %
HCT: 38 % — ABNORMAL LOW (ref 39.0–52.0)
Hemoglobin: 12.5 g/dL — ABNORMAL LOW (ref 13.0–17.0)
Immature Granulocytes: 0 %
Lymphocytes Relative: 11 %
Lymphs Abs: 1.2 10*3/uL (ref 0.7–4.0)
MCH: 26.9 pg (ref 26.0–34.0)
MCHC: 32.9 g/dL (ref 30.0–36.0)
MCV: 81.9 fL (ref 80.0–100.0)
Monocytes Absolute: 0.9 10*3/uL (ref 0.1–1.0)
Monocytes Relative: 9 %
Neutro Abs: 8.1 10*3/uL — ABNORMAL HIGH (ref 1.7–7.7)
Neutrophils Relative %: 79 %
Platelets: 212 10*3/uL (ref 150–400)
RBC: 4.64 MIL/uL (ref 4.22–5.81)
RDW: 14.6 % (ref 11.5–15.5)
WBC: 10.3 10*3/uL (ref 4.0–10.5)
nRBC: 0 % (ref 0.0–0.2)

## 2018-11-20 ENCOUNTER — Ambulatory Visit (HOSPITAL_COMMUNITY): Payer: Medicare Other

## 2018-11-21 ENCOUNTER — Other Ambulatory Visit (HOSPITAL_COMMUNITY): Payer: Self-pay | Admitting: Nurse Practitioner

## 2018-11-21 DIAGNOSIS — C3490 Malignant neoplasm of unspecified part of unspecified bronchus or lung: Secondary | ICD-10-CM

## 2018-11-23 ENCOUNTER — Ambulatory Visit (HOSPITAL_COMMUNITY): Payer: Medicare Other | Admitting: Hematology

## 2018-12-07 ENCOUNTER — Other Ambulatory Visit (HOSPITAL_COMMUNITY): Payer: Self-pay | Admitting: *Deleted

## 2018-12-07 DIAGNOSIS — C3490 Malignant neoplasm of unspecified part of unspecified bronchus or lung: Secondary | ICD-10-CM

## 2018-12-07 MED ORDER — DABRAFENIB MESYLATE 50 MG PO CAPS: ORAL_CAPSULE | ORAL | 0 refills | Status: DC

## 2018-12-07 MED ORDER — TRAMETINIB DIMETHYL SULFOXIDE 0.5 MG PO TABS: ORAL_TABLET | ORAL | 0 refills | Status: DC

## 2018-12-13 ENCOUNTER — Other Ambulatory Visit: Payer: Self-pay

## 2018-12-13 ENCOUNTER — Ambulatory Visit (HOSPITAL_COMMUNITY)
Admission: RE | Admit: 2018-12-13 | Discharge: 2018-12-13 | Disposition: A | Discharge status: Home / Self Care | Payer: Medicare Other | Source: Ambulatory Visit | Attending: Nurse Practitioner | Admitting: Nurse Practitioner

## 2018-12-13 DIAGNOSIS — C3490 Malignant neoplasm of unspecified part of unspecified bronchus or lung: Secondary | ICD-10-CM | POA: Diagnosis not present

## 2018-12-13 DIAGNOSIS — C7800 Secondary malignant neoplasm of unspecified lung: Secondary | ICD-10-CM | POA: Diagnosis not present

## 2018-12-13 MED ORDER — IOHEXOL 300 MG/ML  SOLN
75.0000 mL | Freq: Once | INTRAMUSCULAR | Status: AC | PRN
  Administered 2018-12-13: 75 mL via INTRAVENOUS

## 2018-12-14 ENCOUNTER — Inpatient Hospital Stay (HOSPITAL_COMMUNITY): Payer: Medicare Other | Attending: Hematology | Admitting: Hematology

## 2018-12-14 ENCOUNTER — Encounter (HOSPITAL_COMMUNITY): Payer: Self-pay | Admitting: Hematology

## 2018-12-14 VITALS — BP 146/62 | HR 61 | Temp 97.7°F | Resp 16 | Wt 190.2 lb

## 2018-12-14 DIAGNOSIS — C3411 Malignant neoplasm of upper lobe, right bronchus or lung: Secondary | ICD-10-CM | POA: Diagnosis not present

## 2018-12-14 DIAGNOSIS — N189 Chronic kidney disease, unspecified: Secondary | ICD-10-CM | POA: Diagnosis not present

## 2018-12-14 DIAGNOSIS — E1122 Type 2 diabetes mellitus with diabetic chronic kidney disease: Secondary | ICD-10-CM | POA: Insufficient documentation

## 2018-12-14 DIAGNOSIS — Z87891 Personal history of nicotine dependence: Secondary | ICD-10-CM

## 2018-12-14 DIAGNOSIS — I129 Hypertensive chronic kidney disease with stage 1 through stage 4 chronic kidney disease, or unspecified chronic kidney disease: Secondary | ICD-10-CM | POA: Diagnosis not present

## 2018-12-14 DIAGNOSIS — C3490 Malignant neoplasm of unspecified part of unspecified bronchus or lung: Secondary | ICD-10-CM

## 2018-12-14 MED ORDER — TRAMETINIB DIMETHYL SULFOXIDE 0.5 MG PO TABS: ORAL_TABLET | ORAL | 3 refills | Status: DC

## 2018-12-14 MED ORDER — DABRAFENIB MESYLATE 50 MG PO CAPS: ORAL_CAPSULE | ORAL | 3 refills | Status: DC

## 2018-12-14 NOTE — Progress Notes (Signed)
[place],  [address]   CLINIC:  Medical Oncology/Hematology  PCP:  [name], MD [address] [place] [address] [phone]   REASON FOR VISIT:  Follow-up for NSCLC     INTERVAL HISTORY:  [name] 78 y.o. male seen for follow-up of metastatic non-small cell lung cancer, adenocarcinoma type.  He is taking dabrafenib and trametinib as prescribed on an empty stomach.  Denies any nausea, vomiting or diarrhea or constipation.  Appetite and energy levels are 100%.  No pain reported.  No new onset cough or expectoration.  Denies any bleeding per rectum or melena.  No change in bowel habits.  He is able to do all his ADLs and IADLs.  REVIEW OF SYSTEMS:  Review of Systems  All other systems reviewed and are negative.    PAST MEDICAL/SURGICAL HISTORY:  Past Medical History:  Diagnosis Date  . Cancer ([place])   . Chronic kidney disease   . Coronary artery disease   . Diabetes mellitus   . Hypertension   . 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 Spontaneous pneumothorax 09/15/2010   right   Past Surgical History:  Procedure Laterality Date  . ANKLE FRACTURE SURGERY  2008   [facility]  . APPENDECTOMY  1960's  . CHEST TUBE INSERTION Right 09/15/2010   Dr Servando Snare - spontaneous PTX  . CHOLECYSTECTOMY  2008  . COLONOSCOPY N/A 09/07/2017   Procedure: COLONOSCOPY;  Surgeon: Daneil Dolin, MD;  Location: [facility];  Service: Endoscopy;  Laterality: N/A;  10:30am  . CORONARY ARTERY BYPASS GRAFT  2003  . EYE SURGERY  2001   Cataracts removed bilaterally  . INCISIONAL HERNIA REPAIR N/A 01/29/2015   Procedure: Fatima Blank HERNIORRHAPHY WITH MESH;  Surgeon: Aviva Signs, MD;  Location: [facility] ORS;  Service: General;  Laterality: N/A;  . INSERTION OF MESH N/A 01/29/2015   Procedure: INSERTION OF MESH;  Surgeon: Aviva Signs, MD;  Location: [facility] ORS;  Service: General;  Laterality: N/A;  . [place]  . LEFT HEART CATH AND CORS/GRAFTS ANGIOGRAPHY N/A 06/20/2017   Procedure: LEFT HEART CATH AND CORS/GRAFTS ANGIOGRAPHY;  Surgeon: Martinique, Peter M, MD;  Location: [place] CV LAB;  Service: Cardiovascular;  Laterality: N/A;  . ORIF ANKLE FRACTURE  08/26/2011   Procedure: OPEN REDUCTION INTERNAL FIXATION (ORIF) ANKLE FRACTURE;  Surgeon: Sanjuana Kava, MD;  Location: [facility] ORS;  Service: Orthopedics;  Laterality: Right;  . POLYPECTOMY  09/07/2017   Procedure: POLYPECTOMY;  Surgeon: Daneil Dolin, MD;  Location: [facility];  Service: Endoscopy;;  ascending x2 (cold snare)  . PROSTATE SURGERY  2012   Enlarged prostate  . TRANSURETHRAL RESECTION OF PROSTATE  2012     SOCIAL HISTORY:  Social History   Socioeconomic History  . Marital status: Married    Spouse name: Not on file  . Number of children: Not on file  . Years of education: Not on file  . Highest education level: Not on file  Occupational History  . Not on file  Social Needs  . Financial resource strain: Not on file  . Food insecurity    Worry: Not on file    Inability: Not on file  . Transportation needs    Medical: Not on file    Non-medical:  Not on file  Tobacco Use  . Smoking status: Former Smoker    Packs/day: 1.00    Years: 33.00    Pack years: 33.00    Quit date: 05/17/1987    Years since quitting: 31.6  . Smokeless tobacco: Never Used  Substance and Sexual Activity  . Alcohol use: No  . Drug use: No  . Sexual activity: Yes  Lifestyle  . Physical activity    Days per week: Not on file    Minutes per session: Not on file  . Stress: Not on file  Relationships  . Social Herbalist on phone: Not on file    Gets together: Not on file    Attends religious service: Not on file    Active member of club or organization: Not on file    Attends meetings of clubs or organizations: Not on  file    Relationship status: Not on file  . Intimate partner violence    Fear of current or ex partner: Not on file    Emotionally abused: Not on file    Physically abused: Not on file    Forced sexual activity: Not on file  Other Topics Concern  . Not on file  Social History Narrative  . Not on file    FAMILY HISTORY:  Family History  Problem Relation Age of Onset  . Aneurysm Mother   . Heart attack Father   . Colon cancer Maternal Uncle   . Cancer Brother   . Gastric cancer Neg Hx   . Esophageal cancer Neg Hx     CURRENT MEDICATIONS:  Outpatient Encounter Medications as of 12/14/2018  Medication Sig  . amLODipine (NORVASC) 10 MG tablet Take 1 tablet (10 mg total) by mouth daily.  Marland Kitchen aspirin EC 81 MG tablet Take 81 mg by mouth daily.  . Cholecalciferol (VITAMIN D3) 5000 units CAPS Take 5,000 Units by mouth daily.  Marland Kitchen dabrafenib mesylate (TAFINLAR) 50 MG capsule TAKE 2 CAPSULES (100 MG TOTAL) BY MOUTH 2 (TWO) TIMES DAILY. TAKE ON AN EMPTY STOMACH 1 HOUR BEFORE OR 2 HOURS AFTER MEALS.  Marland Kitchen escitalopram (LEXAPRO) 10 MG tablet   . glimepiride (AMARYL) 2 MG tablet Take 2 mg by mouth daily with breakfast.   . isosorbide mononitrate (IMDUR) 30 MG 24 hr tablet TAKE ONE TABLET (30MG TOTAL) BY MOUTH DAILY  . metFORMIN (GLUCOPHAGE) 500 MG tablet Take 1 tablet (500 mg total) by mouth 2 (two) times daily with a meal.  . metoprolol (LOPRESSOR) 100 MG tablet Take 100 mg by mouth 2 (two) times daily.  . metoprolol tartrate (LOPRESSOR) 100 MG tablet   . nitroGLYCERIN (NITROSTAT) 0.4 MG SL tablet Place 0.4 mg under the tongue every 5 (five) minutes as needed for chest pain.   Marland Kitchen ondansetron (ZOFRAN) 8 MG tablet Take 1 tablet (8 mg total) by mouth every 8 (eight) hours as needed for nausea or vomiting.  . ONE TOUCH ULTRA TEST test strip   . pioglitazone (ACTOS) 45 MG tablet Take 45 mg by mouth daily.  . prochlorperazine (COMPAZINE) 10 MG tablet Take 1 tablet (10 mg total) by mouth every 6 (six)  hours as needed for nausea or vomiting.  . sitaGLIPtin (JANUVIA) 50 MG tablet Take 50 mg by mouth daily.   . trametinib dimethyl sulfoxide (MEKINIST) 0.5 MG tablet TAKE 2 TABLETS (1 MG TOTAL) BY MOUTH DAILY. TAKE 1 HOUR BEFORE OR 2 HOURS AFTER A MEAL. STORE REFRIGERATED IN ORIGINAL CONTAINER.  . [DISCONTINUED]  dabrafenib mesylate (TAFINLAR) 50 MG capsule TAKE 2 CAPSULES (100 MG TOTAL) BY MOUTH 2 (TWO) TIMES DAILY. TAKE ON AN EMPTY STOMACH 1 HOUR BEFORE OR 2 HOURS AFTER MEALS.  . [DISCONTINUED] trametinib dimethyl sulfoxide (MEKINIST) 0.5 MG tablet TAKE 2 TABLETS (1 MG TOTAL) BY MOUTH DAILY. TAKE 1 HOUR BEFORE OR 2 HOURS AFTER A MEAL. STORE REFRIGERATED IN ORIGINAL CONTAINER.  . [DISCONTINUED] trametinib dimethyl sulfoxide (MEKINIST) 0.5 MG tablet TAKE 2 TABLETS (1 MG TOTAL) BY MOUTH DAILY. TAKE 1 HOUR BEFORE OR 2 HOURS AFTER A MEAL. STORE REFRIGERATED IN ORIGINAL CONTAINER.   No facility-administered encounter medications on file as of 12/14/2018.     ALLERGIES:  No Known Allergies   PHYSICAL EXAM:  ECOG Performance status: 1  Vitals:   12/14/18 1500  BP: (!) 146/62  Pulse: 61  Resp: 16  Temp: 97.7 F (36.5 C)  SpO2: 98%   Filed Weights   12/14/18 1500  Weight: 190 lb 4 oz (86.3 kg)    Physical Exam Vitals signs reviewed.  Constitutional:      Appearance: Normal appearance.  Cardiovascular:     Rate and Rhythm: Normal rate and regular rhythm.     Heart sounds: Normal heart sounds.  Pulmonary:     Effort: Pulmonary effort is normal.     Breath sounds: Normal breath sounds.  Abdominal:     General: There is no distension.     Palpations: Abdomen is soft. There is no mass.  Musculoskeletal:        General: Swelling present.  Skin:    General: Skin is warm.  Neurological:     General: No focal deficit present.     Mental Status: He is alert and oriented to person, place, and time.  Psychiatric:        Mood and Affect: Mood normal.        Behavior: Behavior normal.       LABORATORY DATA:  I have reviewed the labs as listed.  CBC    Component Value Date/Time   WBC 10.3 11/13/2018 1058   RBC 4.64 11/13/2018 1058   HGB 12.5 (L) 11/13/2018 1058   HCT 38.0 (L) 11/13/2018 1058   PLT 212 11/13/2018 1058   MCV 81.9 11/13/2018 1058   MCH 26.9 11/13/2018 1058   MCHC 32.9 11/13/2018 1058   RDW 14.6 11/13/2018 1058   LYMPHSABS 1.2 11/13/2018 1058   MONOABS 0.9 11/13/2018 1058   EOSABS 0.1 11/13/2018 1058   BASOSABS 0.0 11/13/2018 1058   CMP Latest Ref Rng & Units 11/13/2018 10/17/2018 09/11/2018  Glucose 70 - 99 mg/dL 290(H) 199(H) 201(H)  BUN 8 - 23 mg/dL 21 18 25(H)  Creatinine 0.61 - 1.24 mg/dL 1.65(H) 1.60(H) 1.68(H)  Sodium 135 - 145 mmol/L 131(L) 134(L) 131(L)  Potassium 3.5 - 5.1 mmol/L 4.3 4.8 4.4  Chloride 98 - 111 mmol/L 96(L) 101 98  CO2 22 - 32 mmol/L '25 24 26  ' Calcium 8.9 - 10.3 mg/dL 8.8(L) 8.9 8.8(L)  Total Protein 6.5 - 8.1 g/dL 7.2 7.4 7.4  Total Bilirubin 0.3 - 1.2 mg/dL 0.8 0.5 0.8  Alkaline Phos 38 - 126 U/L 96 93 100  AST 15 - 41 U/L 14(L) 18 19  ALT 0 - 44 U/L '11 15 15       ' DIAGNOSTIC IMAGING:  I have independently reviewed the scans and discussed with the patient.   I have reviewed Venita Lick LPN's note and agree with the documentation.  I personally performed a face-to-face  visit, made revisions and my assessment and plan is as follows.    ASSESSMENT & PLAN:   Adenocarcinoma of lung, stage 4 ([place]) 1.  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 - 2D echo on 11/09/2018 shows EF of 60-65%, back to baseline from February 2019. - CT CAP on 12/13/2018, shows pulmonary metastatic disease is stable with no additional evidence of progression.  This was compared to PET scan from  07/31/2018. -He is tolerating dabrafenib and trametinib very well.  No GI side effects noted. - He will continue at the same dose level.  We will see him back in 8 weeks for follow-up.  2.  Hypertension: - Blood pressure today is 146/62.  He will continue metoprolol and amlodipine.  3.  CKD: - Creatinine is staying stable around 1.65.  Total time spent is 25 minutes with more than 50% of the time spent face-to-face discussing treatment plan and coordination of care.    Orders placed this encounter:  Orders Placed This Encounter  Procedures  . CBC with Differential/Platelet  . Comprehensive metabolic panel  . Magnesium      Derek Jack, MD [place] [phone]

## 2018-12-14 NOTE — Assessment & Plan Note (Signed)
1.  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 - 2D echo on 11/09/2018 shows EF of 60-65%, back to baseline from February 2019. - CT CAP on 12/13/2018, shows pulmonary metastatic disease is stable with no additional evidence of progression.  This was compared to PET scan from 07/31/2018. -He is tolerating dabrafenib and trametinib very well.  No GI side effects noted. - He will continue at the same dose level.  We will see him back in 8 weeks for follow-up.  2.  Hypertension: - Blood pressure today is 146/62.  He will continue metoprolol and amlodipine.  3.  CKD: - Creatinine is staying stable around 1.65.

## 2018-12-14 NOTE — Patient Instructions (Addendum)
[facility] at [facility] Discharge Instructions  You were seen today by Dr. Delton Coombes. He went over your recent lab results. He will see you back in 8 weeks for labs and follow up.   Thank you for choosing [place] at [facility] to provide your oncology and hematology care.  To afford each patient quality time with our provider, please arrive at least 15 minutes before your scheduled appointment time.   If you have a lab appointment with the [facility] please come in thru the  Main Entrance and check in at the main information desk  You need to re-schedule your appointment should you arrive 10 or more minutes late.  We strive to give you quality time with our providers, and arriving late affects you and other patients whose appointments are after yours.  Also, if you no show three or more times for appointments you may be dismissed from the clinic at the providers discretion.     Again, thank you for choosing [facility].  Our hope is that these requests will decrease the amount of time that you wait before being seen by our physicians.       _____________________________________________________________  Should you have questions after your visit to [facility], please contact our office at [phone] between the hours of 8:00 a.m. and 4:30 p.m.  Voicemails left after 4:00 p.m. will not be returned until the following business day.  For prescription refill requests, have your pharmacy contact our office and allow 72 hours.    Cancer Center Support Programs:   > Cancer Support Group  2nd Tuesday of the month 1pm-2pm, Journey Room

## 2019-01-09 ENCOUNTER — Other Ambulatory Visit (HOSPITAL_COMMUNITY): Payer: Self-pay | Admitting: *Deleted

## 2019-01-09 DIAGNOSIS — C3490 Malignant neoplasm of unspecified part of unspecified bronchus or lung: Secondary | ICD-10-CM

## 2019-01-09 MED ORDER — DABRAFENIB MESYLATE 50 MG PO CAPS: ORAL_CAPSULE | ORAL | 3 refills | Status: DC

## 2019-01-09 MED ORDER — TRAMETINIB DIMETHYL SULFOXIDE 0.5 MG PO TABS: ORAL_TABLET | ORAL | 3 refills | Status: DC

## 2019-02-01 ENCOUNTER — Inpatient Hospital Stay (HOSPITAL_COMMUNITY): Payer: Medicare Other | Attending: Hematology

## 2019-02-01 ENCOUNTER — Other Ambulatory Visit: Payer: Self-pay

## 2019-02-01 DIAGNOSIS — C3411 Malignant neoplasm of upper lobe, right bronchus or lung: Secondary | ICD-10-CM | POA: Insufficient documentation

## 2019-02-01 DIAGNOSIS — N189 Chronic kidney disease, unspecified: Secondary | ICD-10-CM | POA: Diagnosis not present

## 2019-02-01 DIAGNOSIS — Z23 Encounter for immunization: Secondary | ICD-10-CM | POA: Diagnosis not present

## 2019-02-01 DIAGNOSIS — I129 Hypertensive chronic kidney disease with stage 1 through stage 4 chronic kidney disease, or unspecified chronic kidney disease: Secondary | ICD-10-CM | POA: Diagnosis not present

## 2019-02-01 DIAGNOSIS — C3490 Malignant neoplasm of unspecified part of unspecified bronchus or lung: Secondary | ICD-10-CM

## 2019-02-01 LAB — CBC WITH DIFFERENTIAL/PLATELET
Abs Immature Granulocytes: 0.04 10*3/uL (ref 0.00–0.07)
Basophils Absolute: 0 10*3/uL (ref 0.0–0.1)
Basophils Relative: 1 %
Eosinophils Absolute: 0.1 10*3/uL (ref 0.0–0.5)
Eosinophils Relative: 1 %
HCT: 37.6 % — ABNORMAL LOW (ref 39.0–52.0)
Hemoglobin: 12.3 g/dL — ABNORMAL LOW (ref 13.0–17.0)
Immature Granulocytes: 1 %
Lymphocytes Relative: 21 %
Lymphs Abs: 1.3 10*3/uL (ref 0.7–4.0)
MCH: 27.3 pg (ref 26.0–34.0)
MCHC: 32.7 g/dL (ref 30.0–36.0)
MCV: 83.4 fL (ref 80.0–100.0)
Monocytes Absolute: 0.6 10*3/uL (ref 0.1–1.0)
Monocytes Relative: 9 %
Neutro Abs: 4.2 10*3/uL (ref 1.7–7.7)
Neutrophils Relative %: 67 %
Platelets: 184 10*3/uL (ref 150–400)
RBC: 4.51 MIL/uL (ref 4.22–5.81)
RDW: 14.9 % (ref 11.5–15.5)
WBC: 6.3 10*3/uL (ref 4.0–10.5)
nRBC: 0 % (ref 0.0–0.2)

## 2019-02-01 LAB — COMPREHENSIVE METABOLIC PANEL
ALT: 13 U/L (ref 0–44)
AST: 18 U/L (ref 15–41)
Albumin: 3.6 g/dL (ref 3.5–5.0)
Alkaline Phosphatase: 99 U/L (ref 38–126)
Anion gap: 8 (ref 5–15)
BUN: 23 mg/dL (ref 8–23)
CO2: 26 mmol/L (ref 22–32)
Calcium: 8.8 mg/dL — ABNORMAL LOW (ref 8.9–10.3)
Chloride: 100 mmol/L (ref 98–111)
Creatinine, Ser: 1.65 mg/dL — ABNORMAL HIGH (ref 0.61–1.24)
GFR calc Af Amer: 45 mL/min — ABNORMAL LOW (ref >60)
GFR calc non Af Amer: 39 mL/min — ABNORMAL LOW (ref >60)
Glucose, Bld: 195 mg/dL — ABNORMAL HIGH (ref 70–99)
Potassium: 4.6 mmol/L (ref 3.5–5.1)
Sodium: 134 mmol/L — ABNORMAL LOW (ref 135–145)
Total Bilirubin: 0.5 mg/dL (ref 0.3–1.2)
Total Protein: 7 g/dL (ref 6.5–8.1)

## 2019-02-01 LAB — MAGNESIUM: Magnesium: 1.9 mg/dL (ref 1.7–2.4)

## 2019-02-08 ENCOUNTER — Inpatient Hospital Stay (HOSPITAL_BASED_OUTPATIENT_CLINIC_OR_DEPARTMENT_OTHER): Payer: Medicare Other | Admitting: Hematology

## 2019-02-08 ENCOUNTER — Other Ambulatory Visit: Payer: Self-pay

## 2019-02-08 ENCOUNTER — Encounter (HOSPITAL_COMMUNITY): Payer: Self-pay | Admitting: Hematology

## 2019-02-08 VITALS — BP 124/54 | HR 59 | Temp 97.5°F | Resp 18 | Wt 192.3 lb

## 2019-02-08 DIAGNOSIS — C3490 Malignant neoplasm of unspecified part of unspecified bronchus or lung: Secondary | ICD-10-CM | POA: Diagnosis not present

## 2019-02-08 DIAGNOSIS — Z23 Encounter for immunization: Secondary | ICD-10-CM

## 2019-02-08 DIAGNOSIS — C3411 Malignant neoplasm of upper lobe, right bronchus or lung: Secondary | ICD-10-CM | POA: Diagnosis not present

## 2019-02-08 DIAGNOSIS — Z0000 Encounter for general adult medical examination without abnormal findings: Secondary | ICD-10-CM

## 2019-02-08 DIAGNOSIS — N189 Chronic kidney disease, unspecified: Secondary | ICD-10-CM | POA: Diagnosis not present

## 2019-02-08 DIAGNOSIS — I129 Hypertensive chronic kidney disease with stage 1 through stage 4 chronic kidney disease, or unspecified chronic kidney disease: Secondary | ICD-10-CM | POA: Diagnosis not present

## 2019-02-08 MED ORDER — INFLUENZA VAC SPLIT QUAD 0.5 ML IM SUSY: 0.5000 mL | PREFILLED_SYRINGE | Freq: Once | INTRAMUSCULAR | Status: DC

## 2019-02-08 MED ORDER — INFLUENZA VAC A&B SA ADJ QUAD 0.5 ML IM PRSY
0.5000 mL | PREFILLED_SYRINGE | Freq: Once | INTRAMUSCULAR | Status: AC
  Administered 2019-02-08: 0.5 mL via INTRAMUSCULAR
  Filled 2019-02-08: qty 0.5

## 2019-02-08 NOTE — Patient Instructions (Signed)
[facility] at [facility] Discharge Instructions  You were seen today by Dr. Katragadda. He went over your recent lab results. He will see you back in 2 months for labs and follow up.   Thank you for choosing [facility] at [facility] to provide your oncology and hematology care.  To afford each patient quality time with our provider, please arrive at least 15 minutes before your scheduled appointment time.   If you have a lab appointment with the Cancer Center please come in thru the  Main Entrance and check in at the main information desk  You need to re-schedule your appointment should you arrive 10 or more minutes late.  We strive to give you quality time with our providers, and arriving late affects you and other patients whose appointments are after yours.  Also, if you no show three or more times for appointments you may be dismissed from the clinic at the providers discretion.     Again, thank you for choosing [facility].  Our hope is that these requests will decrease the amount of time that you wait before being seen by our physicians.       _____________________________________________________________  Should you have questions after your visit to [facility], please contact our office at [phone] between the hours of 8:00 a.m. and 4:30 p.m.  Voicemails left after 4:00 p.m. will not be returned until the following business day.  For prescription refill requests, have your pharmacy contact our office and allow 72 hours.    Cancer Center Support Programs:   > Cancer Support Group  2nd Tuesday of the month 1pm-2pm, Journey Room    

## 2019-02-08 NOTE — Addendum Note (Signed)
Addended by: Farley Ly on: 02/08/2019 04:37 PM   Modules accepted: Orders

## 2019-02-08 NOTE — Progress Notes (Signed)
[place], [place] [address]   CLINIC:  Medical Oncology/Hematology  PCP:  [name], MD [address] [place] [address] [phone]   REASON FOR VISIT:  Follow-up for NSCLC     INTERVAL HISTORY:  [name] 78 y.o. male seen for follow-up of metastatic non-small cell lung cancer.  He is taking dabrafenib and trametinib on an empty stomach.  Denies any nausea, vomiting, diarrhea or constipation.  Has some chronic cough which is unchanged.  Energy and appetite levels are 100%.  No new pains reported.  No fevers, night sweats or weight loss in the last 6 months.  Denies any change in bowel habits, bleeding per rectum or melena.  REVIEW OF SYSTEMS:  Review of Systems  All other systems reviewed and are negative.    PAST MEDICAL/SURGICAL HISTORY:  Past Medical History:  Diagnosis Date  . Cancer ([place])   . Chronic kidney disease   . Coronary artery disease   . Diabetes mellitus   . Hypertension   . 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 Spontaneous pneumothorax 09/15/2010   right   Past Surgical History:  Procedure Laterality Date  . ANKLE FRACTURE SURGERY  2008   [facility]  . APPENDECTOMY  1960's  . CHEST TUBE INSERTION Right 09/15/2010   Dr Servando Snare - spontaneous PTX  . CHOLECYSTECTOMY  2008  . COLONOSCOPY N/A 09/07/2017   Procedure: COLONOSCOPY;  Surgeon: Daneil Dolin, MD;  Location: [facility];  Service: Endoscopy;  Laterality: N/A;  10:30am  . CORONARY ARTERY BYPASS GRAFT  2003  . EYE SURGERY  2001   Cataracts removed bilaterally  . INCISIONAL HERNIA REPAIR N/A 01/29/2015   Procedure: Fatima Blank HERNIORRHAPHY WITH MESH;  Surgeon: Aviva Signs, MD;  Location: [facility] ORS;  Service: General;  Laterality: N/A;  . INSERTION OF MESH N/A 01/29/2015   Procedure: INSERTION OF MESH;  Surgeon: Aviva Signs, MD;   Location: [facility] OR[facility];  Service: General;  Laterality: N/A;  . [place]  . LEFT HEART CATH AND CORS/GRAFTS ANGIOGRAPHY N/A 06/20/2017   Procedure: LEFT HEART CATH AND CORS/GRAFTS ANGIOGRAPHY;  Surgeon: Martinique, Peter M, MD;  Location: [facility];  Service: Cardiovascular;  Laterality: N/A;  . ORIF ANKLE FRACTURE  08/26/2011   Procedure: OPEN REDUCTION INTERNAL FIXATION (ORIF) ANKLE FRACTURE;  Surgeon: Sanjuana Kava, MD;  Location: [facility];  Service: Orthopedics;  Laterality: Right;  . POLYPECTOMY  09/07/2017   Procedure: POLYPECTOMY;  Surgeon: Daneil Dolin, MD;  Location: [facility];  Service: Endoscopy;;  ascending x2 (cold snare)  . PROSTATE SURGERY  2012   Enlarged prostate  . TRANSURETHRAL RESECTION OF PROSTATE  2012     SOCIAL HISTORY:  Social History   Socioeconomic History  . Marital status: Married    Spouse name: Not on file  . Number of children: Not on file  . Years of education: Not on file  . Highest education level: Not on file  Occupational History  . Not on file  Social Needs  . Financial resource strain: Not on file  . Food insecurity    Worry: Not on file    Inability: Not on file  . Transportation needs    Medical: Not on file    Non-medical: Not on file  Tobacco Use  . Smoking status: Former Smoker    Packs/day: 1.00    Years: 33.00    Pack years: 33.00    Quit date: 05/17/1987    Years since quitting: 31.7  . Smokeless tobacco: Never Used  Substance and Sexual Activity  . Alcohol use: No  . Drug use: No  . Sexual activity: Yes  Lifestyle  . Physical activity    Days per week: Not on file    Minutes per session: Not on file  . Stress: Not on file  Relationships  . Social Herbalist on phone: Not on file    Gets together: Not on file    Attends religious service: Not on file    Active member of club or organization: Not on file    Attends meetings of clubs or organizations: Not on file     Relationship status: Not on file  . Intimate partner violence    Fear of current or ex partner: Not on file    Emotionally abused: Not on file    Physically abused: Not on file    Forced sexual activity: Not on file  Other Topics Concern  . Not on file  Social History Narrative  . Not on file    FAMILY HISTORY:  Family History  Problem Relation Age of Onset  . Aneurysm Mother   . Heart attack Father   . Colon cancer Maternal Uncle   . Cancer Brother   . Gastric cancer Neg Hx   . Esophageal cancer Neg Hx     CURRENT MEDICATIONS:  Outpatient Encounter Medications as of 02/08/2019  Medication Sig  . amLODipine (NORVASC) 10 MG tablet Take 1 tablet (10 mg total) by mouth daily.  Marland Kitchen aspirin EC 81 MG tablet Take 81 mg by mouth daily.  . Cholecalciferol (VITAMIN D3) 5000 units CAPS Take 5,000 Units by mouth daily.  Marland Kitchen dabrafenib mesylate (TAFINLAR) 50 MG capsule TAKE 2 CAPSULES (100 MG TOTAL) BY MOUTH 2 (TWO) TIMES DAILY. TAKE ON AN EMPTY STOMACH 1 HOUR BEFORE OR 2 HOURS AFTER MEALS.  Marland Kitchen escitalopram (LEXAPRO) 10 MG tablet   . glimepiride (AMARYL) 2 MG tablet Take 2 mg by mouth daily with breakfast.   . isosorbide mononitrate (IMDUR) 30 MG 24 hr tablet TAKE ONE TABLET (30MG TOTAL) BY MOUTH DAILY  . metFORMIN (GLUCOPHAGE) 500 MG tablet Take 1 tablet (500 mg total) by mouth 2 (two) times daily with a meal.  . metoprolol (LOPRESSOR) 100 MG tablet Take 100 mg by mouth 2 (two) times daily.  . ONE TOUCH ULTRA TEST test strip   . pioglitazone (ACTOS) 45 MG tablet Take 45 mg by mouth daily.  . sitaGLIPtin (JANUVIA) 50 MG tablet Take 50 mg by mouth daily.   . trametinib dimethyl sulfoxide (MEKINIST) 0.5 MG tablet TAKE 2 TABLETS (1 MG TOTAL) BY MOUTH DAILY. TAKE 1 HOUR BEFORE OR 2 HOURS AFTER A MEAL. STORE REFRIGERATED IN ORIGINAL CONTAINER.  Marland Kitchen nitroGLYCERIN (NITROSTAT) 0.4 MG SL tablet Place 0.4 mg under the tongue every 5 (five) minutes as needed for chest pain.   Marland Kitchen ondansetron (ZOFRAN) 8 MG  tablet Take 1 tablet (8 mg total) by mouth every 8 (eight) hours as needed for nausea or vomiting. (Patient not taking: Reported on 02/08/2019)  . prochlorperazine (COMPAZINE) 10 MG tablet Take 1 tablet (10 mg total) by mouth every 6 (six) hours as needed for nausea or vomiting. (Patient not taking: Reported on 02/08/2019)  . [DISCONTINUED] metoprolol  tartrate (LOPRESSOR) 100 MG tablet   . [EXPIRED] influenza vaccine adjuvanted (FLUAD) injection 0.5 mL   . [DISCONTINUED] influenza vac split quadrivalent PF (FLUARIX) injection 0.5 mL    No facility-administered encounter medications on file as of 02/08/2019.     ALLERGIES:  No Known Allergies   PHYSICAL EXAM:  ECOG Performance status: 1  Vitals:   02/08/19 1502  BP: (!) 124/54  Pulse: (!) 59  Resp: 18  Temp: (!) 97.5 F (36.4 C)  SpO2: 99%   Filed Weights   02/08/19 1502  Weight: 192 lb 4.8 oz (87.2 kg)    Physical Exam Vitals signs reviewed.  Constitutional:      Appearance: Normal appearance.  Cardiovascular:     Rate and Rhythm: Normal rate and regular rhythm.     Heart sounds: Normal heart sounds.  Pulmonary:     Effort: Pulmonary effort is normal.     Breath sounds: Normal breath sounds.  Abdominal:     General: There is no distension.     Palpations: Abdomen is soft. There is no mass.  Musculoskeletal:        General: Swelling present.  Skin:    General: Skin is warm.  Neurological:     General: No focal deficit present.     Mental Status: He is alert and oriented to person, place, and time.  Psychiatric:        Mood and Affect: Mood normal.        Behavior: Behavior normal.      LABORATORY DATA:  I have reviewed the labs as listed.  CBC    Component Value Date/Time   WBC 6.3 02/01/2019 1015   RBC 4.51 02/01/2019 1015   HGB 12.3 (L) 02/01/2019 1015   HCT 37.6 (L) 02/01/2019 1015   PLT 184 02/01/2019 1015   MCV 83.4 02/01/2019 1015   MCH 27.3 02/01/2019 1015   MCHC 32.7 02/01/2019 1015   RDW 14.9  02/01/2019 1015   LYMPHSABS 1.3 02/01/2019 1015   MONOABS 0.6 02/01/2019 1015   EOSABS 0.1 02/01/2019 1015   BASOSABS 0.0 02/01/2019 1015   CMP Latest Ref Rng & Units 02/01/2019 11/13/2018 10/17/2018  Glucose 70 - 99 mg/dL 195(H) 290(H) 199(H)  BUN 8 - 23 mg/dL '23 21 18  ' Creatinine 0.61 - 1.24 mg/dL 1.65(H) 1.65(H) 1.60(H)  Sodium 135 - 145 mmol/L 134(L) 131(L) 134(L)  Potassium 3.5 - 5.1 mmol/L 4.6 4.3 4.8  Chloride 98 - 111 mmol/L 100 96(L) 101  CO2 22 - 32 mmol/L '26 25 24  ' Calcium 8.9 - 10.3 mg/dL 8.8(L) 8.8(L) 8.9  Total Protein 6.5 - 8.1 g/dL 7.0 7.2 7.4  Total Bilirubin 0.3 - 1.2 mg/dL 0.5 0.8 0.5  Alkaline Phos 38 - 126 U/L 99 96 93  AST 15 - 41 U/L 18 14(L) 18  ALT 0 - 44 U/L '13 11 15       ' DIAGNOSTIC IMAGING:  I have independently reviewed the scans and discussed with the patient.   I have reviewed Venita Lick LPN's note and agree with the documentation.  I personally performed a face-to-face visit, made revisions and my assessment and plan is as follows.    ASSESSMENT & PLAN:   Adenocarcinoma of lung, stage 4 ([place]) 1.  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 - 2D echo on 11/09/2018 shows EF of 60-65%, back to baseline from February 2019. - He is tolerating dabrafenib and trametinib very well.  Occasional cough present.  No GI side effects. - We reviewed results of the CT CAP from 12/13/2018.  Pulmonary metastatic disease is stable with no evidence of new areas. -We also reviewed blood work which is grossly unchanged. -I will see him back in 8 weeks for follow-up with repeat labs.   2.  Hypertension: - He will continue metoprolol and amlodipine.  Blood pressure today is well controlled at 124/54.  3.  CKD: -  Creatinine is staying stable at 1.65.  Total time spent is 25 minutes with more than 50% of the time spent face-to-face discussing treatment plan and coordination of care.    Orders placed this encounter:  No orders of the defined types were placed in this encounter.     Derek Jack, MD [place] [phone]

## 2019-02-08 NOTE — Assessment & Plan Note (Signed)
1.  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 - 2D echo on 11/09/2018 shows EF of 60-65%, back to baseline from February 2019. - He is tolerating dabrafenib and trametinib very well.  Occasional cough present.  No GI side effects. - We reviewed results of the CT CAP from 12/13/2018.  Pulmonary metastatic disease is stable with no evidence of new areas. -We also reviewed blood work which is grossly unchanged. -I will see him back in 8 weeks for follow-up with repeat labs.   2.  Hypertension: - He will continue metoprolol and amlodipine.  Blood pressure today is well controlled at 124/54.  3.  CKD: - Creatinine is staying stable at 1.65.

## 2019-03-01 DIAGNOSIS — E1129 Type 2 diabetes mellitus with other diabetic kidney complication: Secondary | ICD-10-CM | POA: Diagnosis not present

## 2019-03-01 DIAGNOSIS — I2510 Atherosclerotic heart disease of native coronary artery without angina pectoris: Secondary | ICD-10-CM | POA: Diagnosis not present

## 2019-03-01 DIAGNOSIS — N183 Chronic kidney disease, stage 3 unspecified: Secondary | ICD-10-CM | POA: Diagnosis not present

## 2019-03-07 IMAGING — DX DG CHEST 1V PORT
1 series · 1 of 1 positions shown · non-contrast
Comparison: CT biopsy 08/18/2017, earlier same day. Chest x-ray
06/17/2017.

CLINICAL DATA: Pneumothorax after biopsy.

EXAM:
PORTABLE CHEST 1 VIEW

[chest]
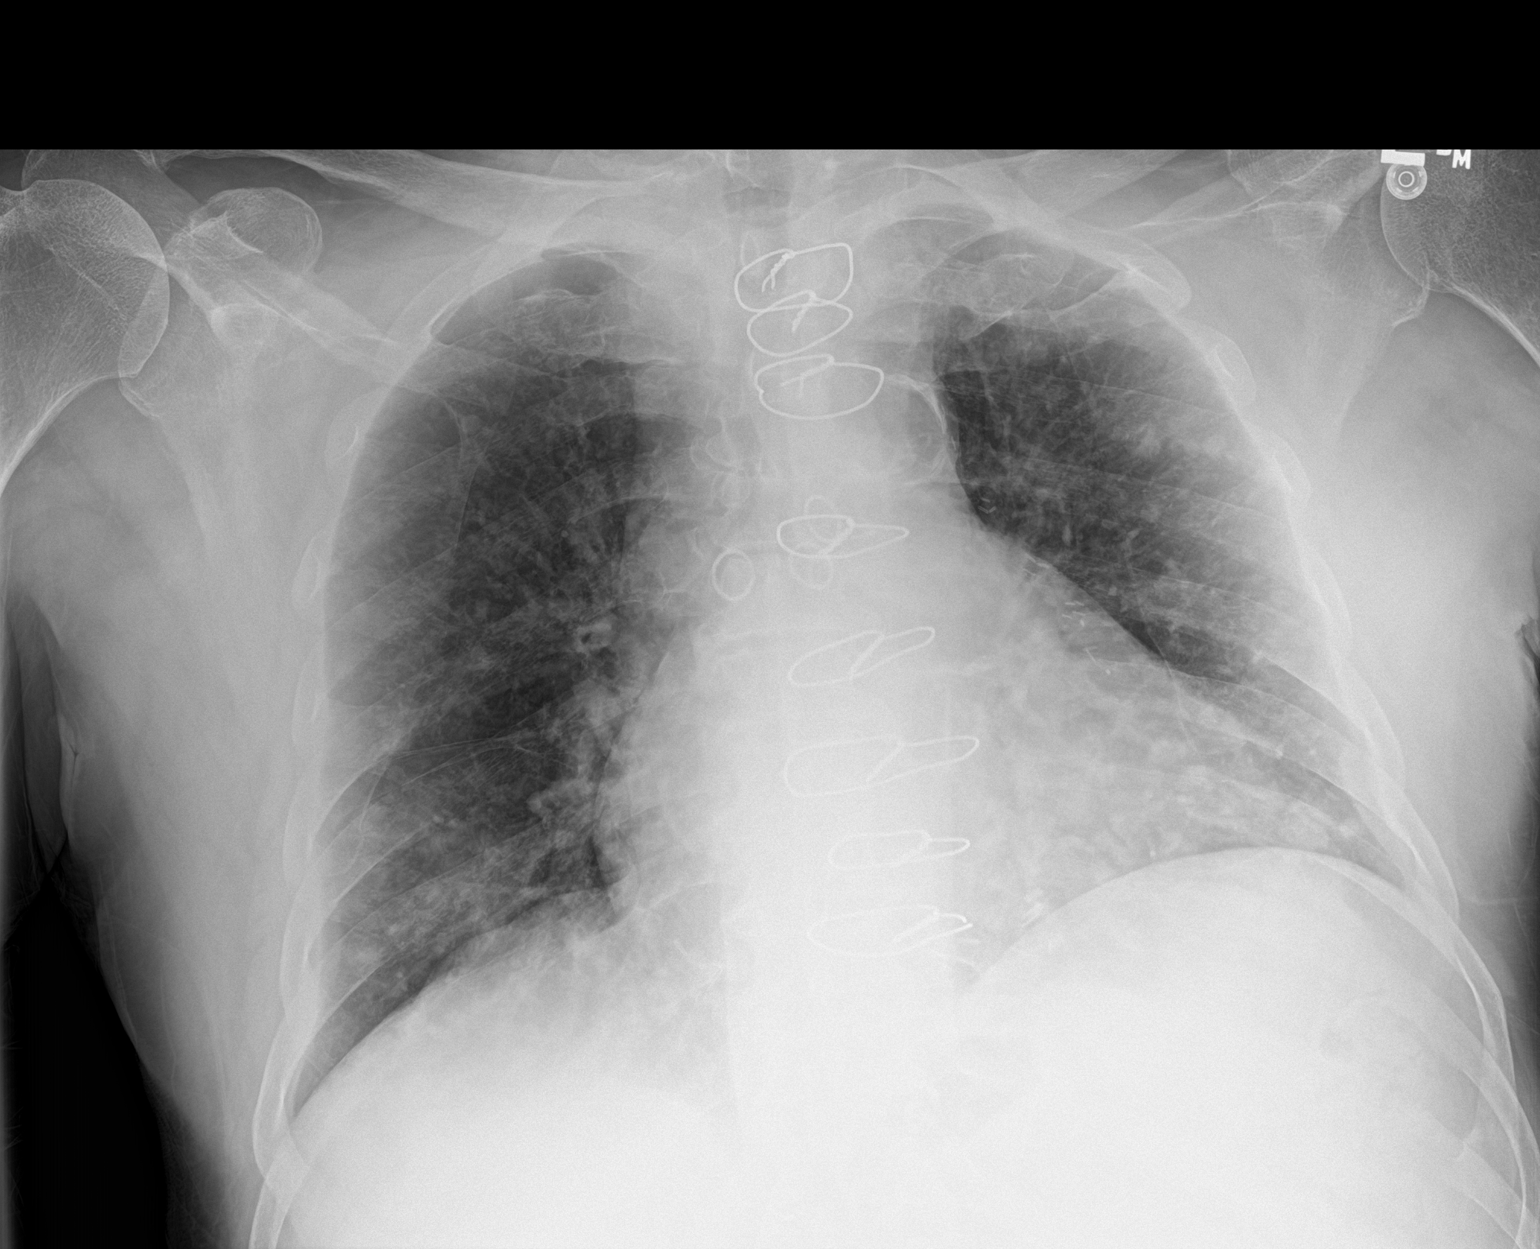

[1 of 1 positions shown; findings below may reference images not displayed]

FINDINGS: 3239 hours. 70 upright AP film shows no definite pleural line to
indicate underlying pneumothorax. Lung volumes are low. Bilateral
pulmonary nodules are evident. The cardio pericardial silhouette is
enlarged. Status post CABG The visualized bony structures of the
thorax are intact.
IMPRESSION: No definite findings of pneumothorax after right lower lobe lung
biopsy.

## 2019-03-07 IMAGING — CT CT BIOPSY
1 of 3 series · 13 of 32 positions shown, 18 images · non-contrast
Comparison: none

INDICATION: 76-year-old male with a history of multiple lung nodules with
unknown primary malignancy

[Series 2: i-spiral 5.0 b40f · axial · 0.71mm/px · z∈[+1141,+1386]mm · 13 of 81 slices shown, 18 images]
[im 6/81  soft-tissue]
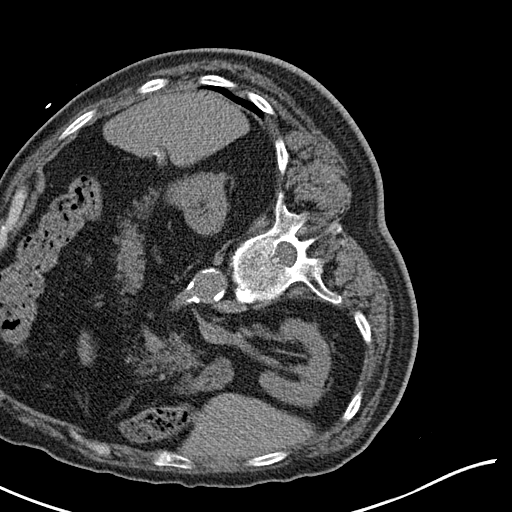
[im 6/81  bone]
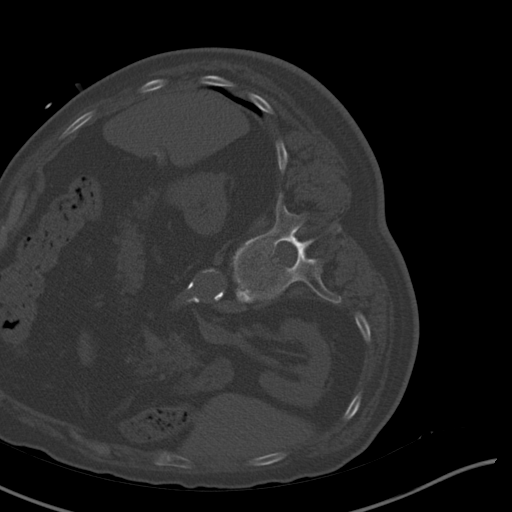
[im 11/81  soft-tissue]
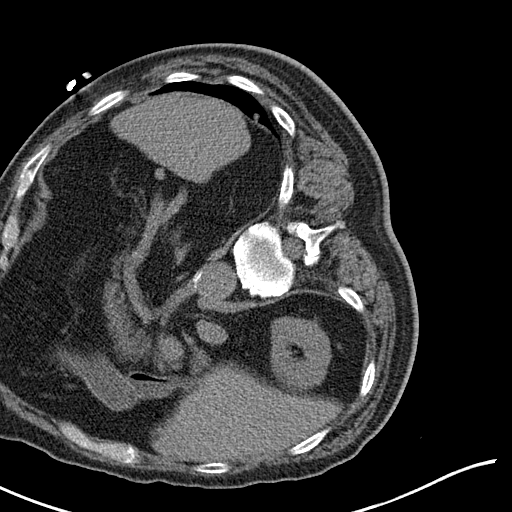
[im 21/81  soft-tissue]
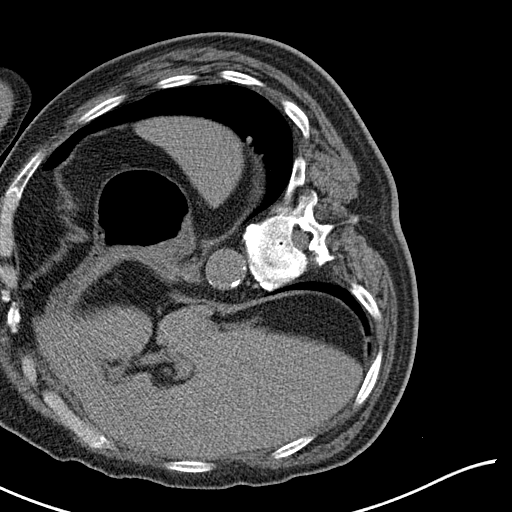
[im 26/81  soft-tissue]
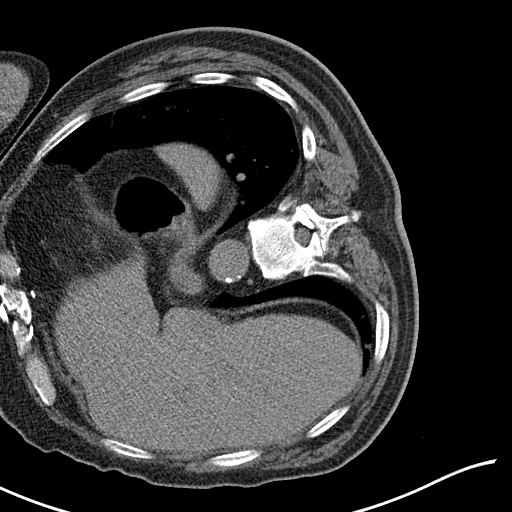
[im 31/81  soft-tissue]
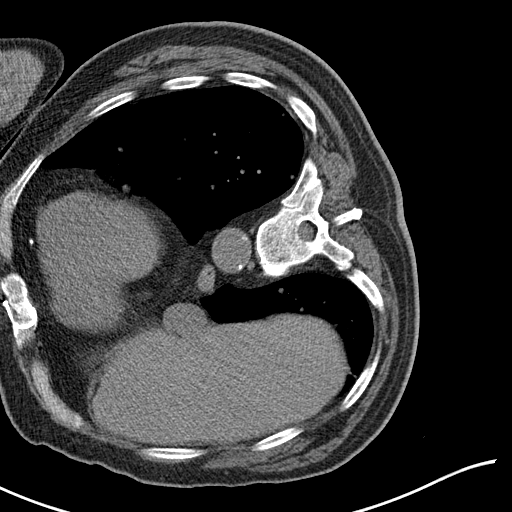
[im 36/81  soft-tissue]
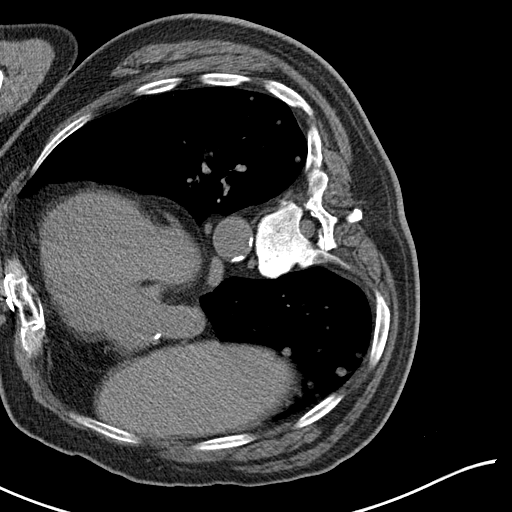
[im 46/81  soft-tissue]
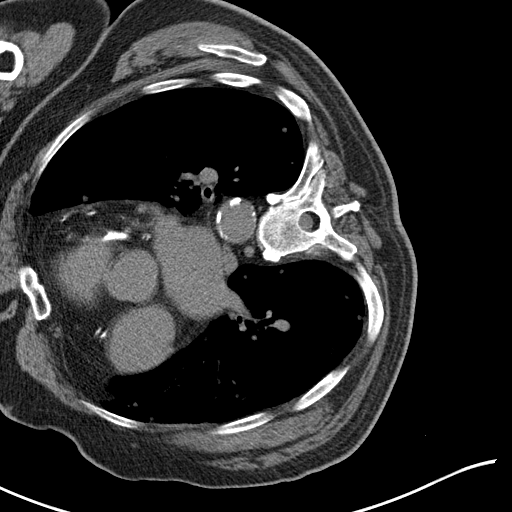
[im 51/81  soft-tissue]
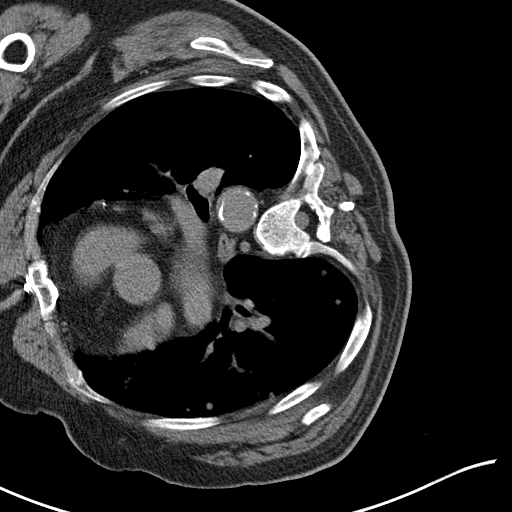
[im 56/81  soft-tissue]
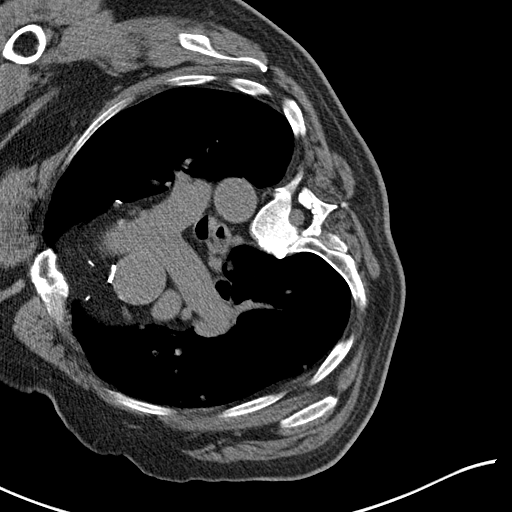
[im 56/81  bone]
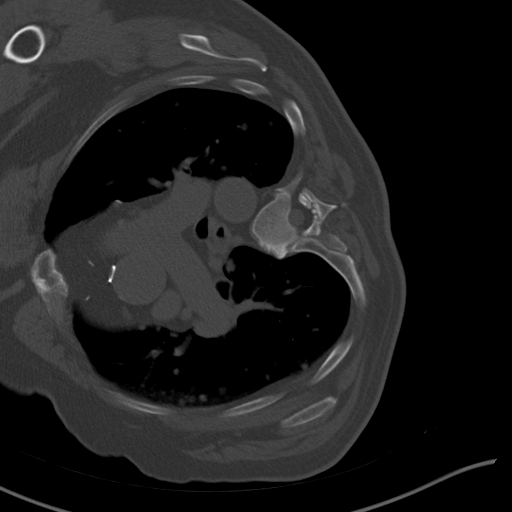
[im 61/81  soft-tissue]
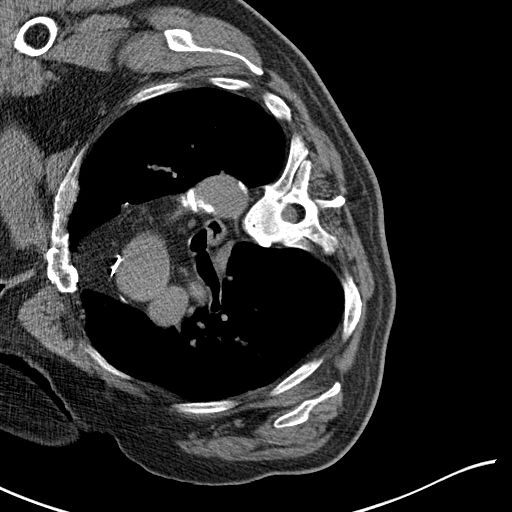
[im 61/81  lung]
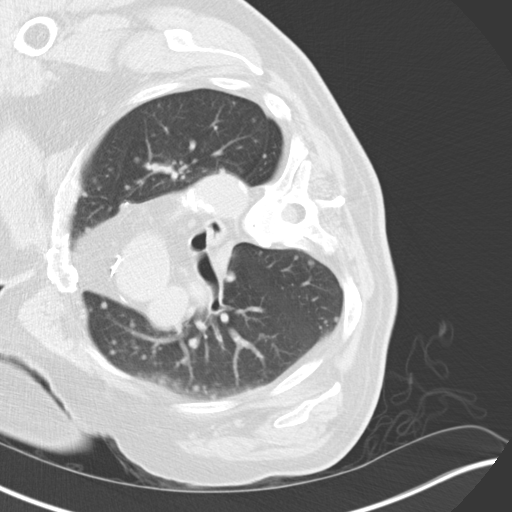
[im 66/81  lung]
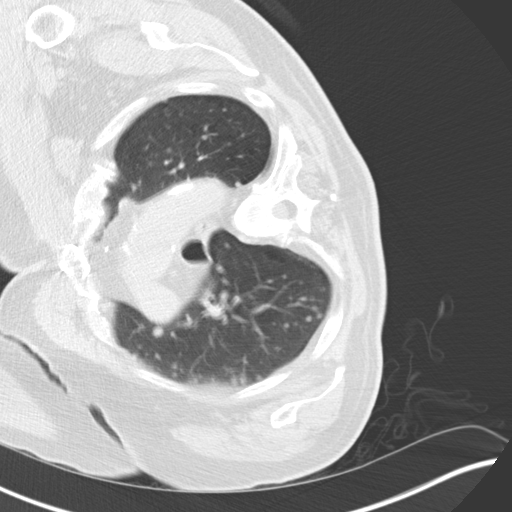
[im 71/81  soft-tissue]
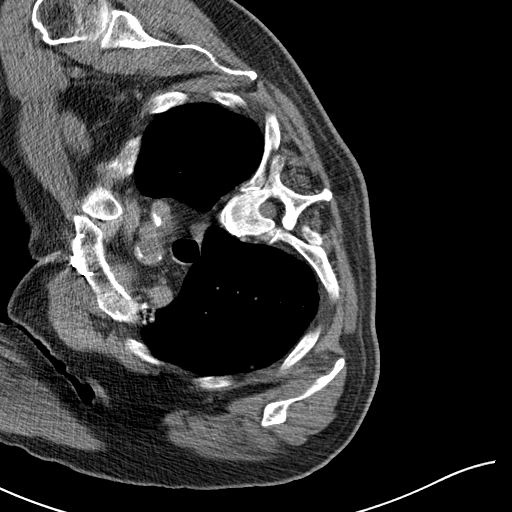
[im 71/81  lung]
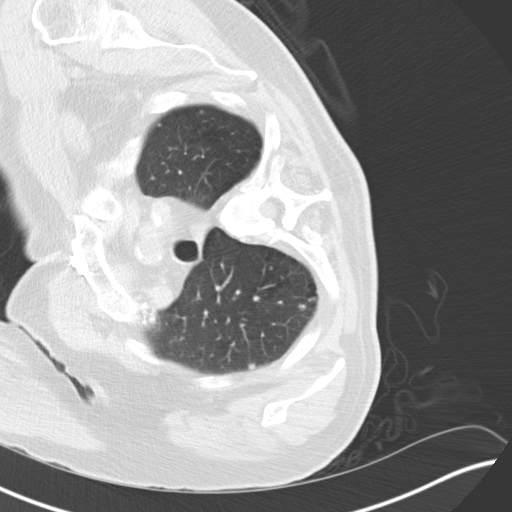
[im 76/81  soft-tissue]
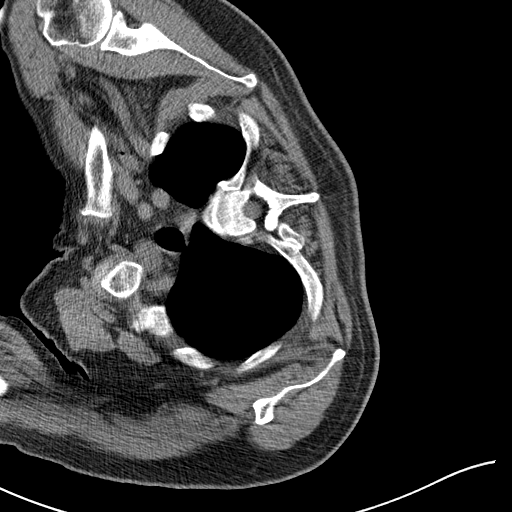
[im 76/81  lung]
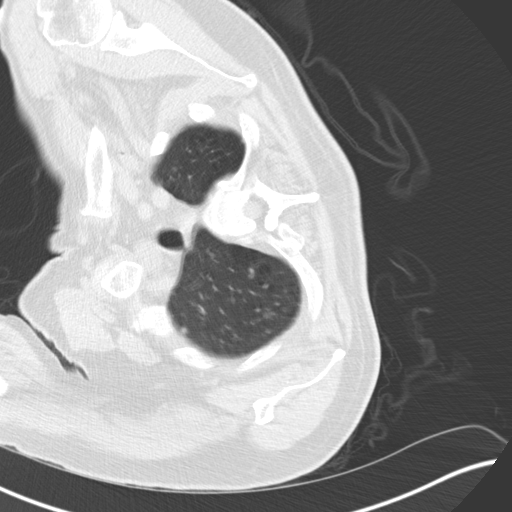

[13 of 32 positions shown; findings below may reference images not displayed]

EXAM:
CT BIOPSY

MEDICATIONS:
None.

ANESTHESIA/SEDATION:
Moderate (conscious) sedation was employed during this procedure. A
total of Versed 1.5 mg and Fentanyl 75 mcg was administered
intravenously.

Moderate Sedation Time: 15 minutes. The patient's level of
consciousness and vital signs were monitored continuously by
radiology nursing throughout the procedure under my direct
supervision.

FLUOROSCOPY TIME:  CT

COMPLICATIONS:
None

PROCEDURE:
The procedure, risks, benefits, and alternatives were explained to
the patient and the patient's family. Specific risks that were
addressed included bleeding, infection, pneumothorax, need for
further procedure including chest tube placement, chance of delayed
pneumothorax or hemorrhage, hemoptysis, nondiagnostic sample,
cardiopulmonary collapse, death. Questions regarding the procedure
were encouraged and answered. The patient understands and consents
to the procedure.

Patient was positioned in the right decubitus position on the CT
gantry table and a scout CT of the chest was performed for planning
purposes.

Once angle of approach was determined, the skin and subcutaneous
tissues this scan was prepped and draped in the usual sterile
fashion, and a sterile drape was applied covering the operative
field. A sterile gown and sterile gloves were used for the
procedure. Local anesthesia was provided with 1% Lidocaine.

The skin and subcutaneous tissues were infiltrated 1% lidocaine for
local anesthesia, and a small stab incision was made with an 11
blade scalpel.

Using CT guidance, a 17 gauge trocar needle was advanced into the
right lower lobetarget. After confirmation of the tip, separate 18
gauge core biopsies were performed. These were placed into solution
for transportation to the lab.

Biosentry Device was deployed. A final CT image was performed.

Patient tolerated the procedure well and remained hemodynamically
stable throughout.

No complications were encountered and no significant blood loss was
encounter
IMPRESSION: Status post CT-guided biopsy of right lower lobe lung nodule. Tissue
specimen sent to pathology for complete histopathologic analysis.

## 2019-03-08 DIAGNOSIS — R7309 Other abnormal glucose: Secondary | ICD-10-CM | POA: Diagnosis not present

## 2019-03-08 DIAGNOSIS — I25111 Atherosclerotic heart disease of native coronary artery with angina pectoris with documented spasm: Secondary | ICD-10-CM | POA: Diagnosis not present

## 2019-03-08 DIAGNOSIS — E1122 Type 2 diabetes mellitus with diabetic chronic kidney disease: Secondary | ICD-10-CM | POA: Diagnosis not present

## 2019-03-08 DIAGNOSIS — N183 Chronic kidney disease, stage 3 unspecified: Secondary | ICD-10-CM | POA: Diagnosis not present

## 2019-04-03 ENCOUNTER — Other Ambulatory Visit: Payer: Self-pay

## 2019-04-03 ENCOUNTER — Inpatient Hospital Stay (HOSPITAL_COMMUNITY): Payer: Medicare Other | Attending: Hematology

## 2019-04-03 DIAGNOSIS — C3490 Malignant neoplasm of unspecified part of unspecified bronchus or lung: Secondary | ICD-10-CM | POA: Diagnosis not present

## 2019-04-03 LAB — CBC WITH DIFFERENTIAL/PLATELET
Abs Immature Granulocytes: 0.02 10*3/uL (ref 0.00–0.07)
Basophils Absolute: 0 10*3/uL (ref 0.0–0.1)
Basophils Relative: 0 %
Eosinophils Absolute: 0 10*3/uL (ref 0.0–0.5)
Eosinophils Relative: 0 %
HCT: 36.4 % — ABNORMAL LOW (ref 39.0–52.0)
Hemoglobin: 11.9 g/dL — ABNORMAL LOW (ref 13.0–17.0)
Immature Granulocytes: 0 %
Lymphocytes Relative: 26 %
Lymphs Abs: 1.2 10*3/uL (ref 0.7–4.0)
MCH: 27.1 pg (ref 26.0–34.0)
MCHC: 32.7 g/dL (ref 30.0–36.0)
MCV: 82.9 fL (ref 80.0–100.0)
Monocytes Absolute: 0.5 10*3/uL (ref 0.1–1.0)
Monocytes Relative: 12 %
Neutro Abs: 2.7 10*3/uL (ref 1.7–7.7)
Neutrophils Relative %: 62 %
Platelets: 166 10*3/uL (ref 150–400)
RBC: 4.39 MIL/uL (ref 4.22–5.81)
RDW: 14.2 % (ref 11.5–15.5)
WBC: 4.5 10*3/uL (ref 4.0–10.5)
nRBC: 0 % (ref 0.0–0.2)

## 2019-04-03 LAB — COMPREHENSIVE METABOLIC PANEL
ALT: 13 U/L (ref 0–44)
AST: 19 U/L (ref 15–41)
Albumin: 3.5 g/dL (ref 3.5–5.0)
Alkaline Phosphatase: 93 U/L (ref 38–126)
Anion gap: 7 (ref 5–15)
BUN: 23 mg/dL (ref 8–23)
CO2: 26 mmol/L (ref 22–32)
Calcium: 8.8 mg/dL — ABNORMAL LOW (ref 8.9–10.3)
Chloride: 100 mmol/L (ref 98–111)
Creatinine, Ser: 1.54 mg/dL — ABNORMAL HIGH (ref 0.61–1.24)
GFR calc Af Amer: 49 mL/min — ABNORMAL LOW (ref >60)
GFR calc non Af Amer: 42 mL/min — ABNORMAL LOW (ref >60)
Glucose, Bld: 227 mg/dL — ABNORMAL HIGH (ref 70–99)
Potassium: 4.8 mmol/L (ref 3.5–5.1)
Sodium: 133 mmol/L — ABNORMAL LOW (ref 135–145)
Total Bilirubin: 0.7 mg/dL (ref 0.3–1.2)
Total Protein: 6.9 g/dL (ref 6.5–8.1)

## 2019-04-03 LAB — MAGNESIUM: Magnesium: 1.9 mg/dL (ref 1.7–2.4)

## 2019-04-10 ENCOUNTER — Encounter (HOSPITAL_COMMUNITY): Payer: Self-pay | Admitting: Hematology

## 2019-04-10 ENCOUNTER — Inpatient Hospital Stay (HOSPITAL_BASED_OUTPATIENT_CLINIC_OR_DEPARTMENT_OTHER): Payer: Medicare Other | Admitting: Hematology

## 2019-04-10 ENCOUNTER — Other Ambulatory Visit: Payer: Self-pay | Admitting: Cardiovascular Disease

## 2019-04-10 DIAGNOSIS — C3490 Malignant neoplasm of unspecified part of unspecified bronchus or lung: Secondary | ICD-10-CM | POA: Diagnosis not present

## 2019-04-10 NOTE — Progress Notes (Signed)
Virtual Visit via Telephone Note  I connected with [name] on 04/10/19 at  2:35 PM EST by telephone and verified that I am speaking with the correct person using two identifiers.   I discussed the limitations, risks, security and privacy concerns of performing an evaluation and management service by telephone and the availability of in person appointments. I also discussed with the patient that there may be a patient responsible charge related to this service. The patient expressed understanding and agreed to proceed.   History of Present Illness: He is being seen at our clinic for metastatic non-small cell lung cancer, adenocarcinoma type, BRAF mutation positive.  He is on dabrafenib and trametinib which is tolerating well.   Observations/Objective: He denies any new onset pains.  Denies any cough or shortness of breath on exertion.  No signs or symptoms of PND or orthopnea.  Appetite and energy levels are 100%.  Denies any fevers or chills.  No cough or expectoration reported.  No ER visits or hospitalizations.  Assessment and Plan:  1.  Metastatic adenocarcinoma of the lung, BRAF V600E mutation positive: -Dabrafenib 100 mg twice daily and trametinib 1 mg daily started in May 2019. -Last CT scan on 12/13/2018 shows stable pulmonary metastatic disease with no evidence of new areas. -He is continuing to tolerate the pills very well. -We have reviewed his blood work which is grossly within normal limits. -I plan to repeat scans in [redacted] weeks along with blood work.  2.  CKD: -His creatinine has been stable between 1.5-2.0.   Follow Up Instructions:    I discussed the assessment and treatment plan with the patient. The patient was provided an opportunity to ask questions and all were answered. The patient agreed with the plan and demonstrated an understanding of the instructions.   The patient was advised to call back or seek an in-person evaluation if the symptoms worsen or if the  condition fails to improve as anticipated.  I provided 12 minutes of non-face-to-face time during this encounter.   Derek Jack, MD

## 2019-04-24 ENCOUNTER — Telehealth (HOSPITAL_COMMUNITY): Payer: Self-pay | Admitting: Pharmacy Technician

## 2019-05-03 NOTE — Telephone Encounter (Signed)
Oral Oncology Patient Advocate Encounter  Received communication from [facility] that the patient's eligibility in the patient assistance program was due for re-enrollment.  The renewal application has been completed and faxed in an effort to keep the patient's out of pocket expense for Mekinist and Tafinlar at $0.     Application completed and faxed to [phone] on 04/24/2019.    [employer] Patient [facility] phone number for follow up is [phone].    This encounter will be updated until final determination.  [facility] Patient [facility] Phone [phone] Fax [phone]

## 2019-05-16 ENCOUNTER — Inpatient Hospital Stay (HOSPITAL_COMMUNITY): Payer: Medicare Other | Attending: Hematology

## 2019-05-16 ENCOUNTER — Other Ambulatory Visit: Payer: Self-pay

## 2019-05-16 DIAGNOSIS — C3490 Malignant neoplasm of unspecified part of unspecified bronchus or lung: Secondary | ICD-10-CM

## 2019-05-16 DIAGNOSIS — C3411 Malignant neoplasm of upper lobe, right bronchus or lung: Secondary | ICD-10-CM | POA: Insufficient documentation

## 2019-05-16 LAB — CBC WITH DIFFERENTIAL/PLATELET
Abs Immature Granulocytes: 0.02 10*3/uL (ref 0.00–0.07)
Basophils Absolute: 0 10*3/uL (ref 0.0–0.1)
Basophils Relative: 0 %
Eosinophils Absolute: 0.1 10*3/uL (ref 0.0–0.5)
Eosinophils Relative: 1 %
HCT: 38.4 % — ABNORMAL LOW (ref 39.0–52.0)
Hemoglobin: 12.2 g/dL — ABNORMAL LOW (ref 13.0–17.0)
Immature Granulocytes: 0 %
Lymphocytes Relative: 24 %
Lymphs Abs: 1.8 10*3/uL (ref 0.7–4.0)
MCH: 26.3 pg (ref 26.0–34.0)
MCHC: 31.8 g/dL (ref 30.0–36.0)
MCV: 82.9 fL (ref 80.0–100.0)
Monocytes Absolute: 0.6 10*3/uL (ref 0.1–1.0)
Monocytes Relative: 7 %
Neutro Abs: 5 10*3/uL (ref 1.7–7.7)
Neutrophils Relative %: 68 %
Platelets: 222 10*3/uL (ref 150–400)
RBC: 4.63 MIL/uL (ref 4.22–5.81)
RDW: 14.2 % (ref 11.5–15.5)
WBC: 7.5 10*3/uL (ref 4.0–10.5)
nRBC: 0 % (ref 0.0–0.2)

## 2019-05-16 LAB — COMPREHENSIVE METABOLIC PANEL
ALT: 16 U/L (ref 0–44)
AST: 19 U/L (ref 15–41)
Albumin: 3.6 g/dL (ref 3.5–5.0)
Alkaline Phosphatase: 97 U/L (ref 38–126)
Anion gap: 8 (ref 5–15)
BUN: 18 mg/dL (ref 8–23)
CO2: 26 mmol/L (ref 22–32)
Calcium: 9 mg/dL (ref 8.9–10.3)
Chloride: 101 mmol/L (ref 98–111)
Creatinine, Ser: 1.51 mg/dL — ABNORMAL HIGH (ref 0.61–1.24)
GFR calc Af Amer: 50 mL/min — ABNORMAL LOW (ref >60)
GFR calc non Af Amer: 43 mL/min — ABNORMAL LOW (ref >60)
Glucose, Bld: 195 mg/dL — ABNORMAL HIGH (ref 70–99)
Potassium: 4.6 mmol/L (ref 3.5–5.1)
Sodium: 135 mmol/L (ref 135–145)
Total Bilirubin: 0.7 mg/dL (ref 0.3–1.2)
Total Protein: 7.2 g/dL (ref 6.5–8.1)

## 2019-05-16 LAB — LACTATE DEHYDROGENASE: LDH: 144 U/L (ref 98–192)

## 2019-05-21 ENCOUNTER — Other Ambulatory Visit: Payer: Self-pay

## 2019-05-21 ENCOUNTER — Ambulatory Visit (HOSPITAL_COMMUNITY)
Admission: RE | Admit: 2019-05-21 | Discharge: 2019-05-21 | Disposition: A | Discharge status: Home / Self Care | Payer: Medicare Other | Source: Ambulatory Visit | Attending: Hematology | Admitting: Hematology

## 2019-05-21 DIAGNOSIS — C3490 Malignant neoplasm of unspecified part of unspecified bronchus or lung: Secondary | ICD-10-CM | POA: Diagnosis not present

## 2019-05-21 DIAGNOSIS — C7800 Secondary malignant neoplasm of unspecified lung: Secondary | ICD-10-CM | POA: Diagnosis not present

## 2019-05-21 MED ORDER — IOHEXOL 300 MG/ML  SOLN
75.0000 mL | Freq: Once | INTRAMUSCULAR | Status: AC | PRN
  Administered 2019-05-21: 75 mL via INTRAVENOUS

## 2019-05-24 ENCOUNTER — Inpatient Hospital Stay (HOSPITAL_COMMUNITY): Payer: Medicare Other | Attending: Hematology | Admitting: Hematology

## 2019-05-24 ENCOUNTER — Encounter (HOSPITAL_COMMUNITY): Payer: Self-pay | Admitting: Hematology

## 2019-05-24 ENCOUNTER — Other Ambulatory Visit: Payer: Self-pay

## 2019-05-24 VITALS — BP 153/60 | HR 63 | Temp 97.6°F | Resp 20 | Wt 195.4 lb

## 2019-05-24 DIAGNOSIS — Z79899 Other long term (current) drug therapy: Secondary | ICD-10-CM | POA: Insufficient documentation

## 2019-05-24 DIAGNOSIS — E1122 Type 2 diabetes mellitus with diabetic chronic kidney disease: Secondary | ICD-10-CM | POA: Insufficient documentation

## 2019-05-24 DIAGNOSIS — C3490 Malignant neoplasm of unspecified part of unspecified bronchus or lung: Secondary | ICD-10-CM

## 2019-05-24 DIAGNOSIS — I129 Hypertensive chronic kidney disease with stage 1 through stage 4 chronic kidney disease, or unspecified chronic kidney disease: Secondary | ICD-10-CM | POA: Diagnosis not present

## 2019-05-24 DIAGNOSIS — Z87891 Personal history of nicotine dependence: Secondary | ICD-10-CM | POA: Insufficient documentation

## 2019-05-24 DIAGNOSIS — N189 Chronic kidney disease, unspecified: Secondary | ICD-10-CM | POA: Insufficient documentation

## 2019-05-24 NOTE — Progress Notes (Signed)
[name], [name] [id number]   CLINIC:  Medical Oncology/Hematology  PCP:  [name], MD [address] [place] [address] [phone]   REASON FOR VISIT:  Follow-up for NSCLC     INTERVAL HISTORY:  [name] 79 y.o. male seen for follow-up of metastatic non-small cell lung cancer.  He is taking dabrafenib and trametinib on empty stomach without any GI issues.  Appetite and energy levels are 100%.  Denies any infections or hospitalizations in the last 6 months.  REVIEW OF SYSTEMS:  Review of Systems  All other systems reviewed and are negative.    PAST MEDICAL/SURGICAL HISTORY:  Past Medical History:  Diagnosis Date  . Cancer ([place])   . Chronic kidney disease   . Coronary artery disease   . Diabetes mellitus   . Hypertension   . 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 Spontaneous pneumothorax 09/15/2010   right   Past Surgical History:  Procedure Laterality Date  . ANKLE FRACTURE SURGERY  2008   [facility]  . APPENDECTOMY  1960's  . CHEST TUBE INSERTION Right 09/15/2010   Dr Servando Snare - spontaneous PTX  . CHOLECYSTECTOMY  2008  . COLONOSCOPY N/A 09/07/2017   Procedure: COLONOSCOPY;  Surgeon: Daneil Dolin, MD;  Location: [facility] END[facility] SU[facility];  Service: Endoscopy;  Laterality: N/A;  10:30am  . CORONARY ARTERY BYPASS GRAFT  2003  . EYE SURGERY  2001   Cataracts removed bilaterally  . INCISIONAL HERNIA REPAIR N/A 01/29/2015   Procedure: Fatima Blank HERNIORRHAPHY WITH MESH;  Surgeon: Aviva Signs, MD;  Location: [facility] ORS;  Service: General;  Laterality: N/A;  . INSERTION OF MESH N/A 01/29/2015   Procedure: INSERTION OF MESH;  Surgeon: Aviva Signs, MD;  Location: [facility] ORS;  Service: General;  Laterality: N/A;  . [place]  . LEFT HEART CATH AND CORS/GRAFTS ANGIOGRAPHY N/A 06/20/2017   Procedure: LEFT HEART CATH AND CORS/GRAFTS ANGIOGRAPHY;  Surgeon: Martinique, Peter M, MD;  Location: [place] CV LAB;  Service: Cardiovascular;  Laterality: N/A;  . ORIF ANKLE FRACTURE  08/26/2011   Procedure: OPEN REDUCTION INTERNAL FIXATION (ORIF) ANKLE FRACTURE;  Surgeon: Sanjuana Kava, MD;  Location: [facility] ORS;  Service: Orthopedics;  Laterality: Right;  . POLYPECTOMY  09/07/2017   Procedure: POLYPECTOMY;  Surgeon: Daneil Dolin, MD;  Location: [facility];  Service: Endoscopy;;  ascending x2 (cold snare)  . PROSTATE SURGERY  2012   Enlarged prostate  . TRANSURETHRAL RESECTION OF PROSTATE  2012     SOCIAL HISTORY:  Social History   Socioeconomic History  . Marital status: Married    Spouse name: Not on file  . Number of children: Not on file  . Years of education: Not on file  . Highest education level: Not on file  Occupational History  . Not on file  Tobacco Use  . Smoking status: Former Smoker    Packs/day: 1.00    Years: 33.00    Pack years: 33.00    Quit date: 05/17/1987    Years since quitting: 32.0  . Smokeless tobacco: Never Used  Substance and Sexual Activity  . Alcohol use: No  . Drug use: No  . Sexual activity: Yes  Other Topics Concern  . Not on file  Social History Narrative  . Not  on file   Social Determinants of Health   Financial Resource Strain:   . Difficulty of Paying Living Expenses: Not on file  Food Insecurity:   . Worried About Charity fundraiser in the Last Year: Not on file  . Ran Out of Food in the Last Year: Not on file  Transportation Needs:   . Lack of Transportation (Medical): Not on file  . Lack of Transportation (Non-Medical): Not on file  Physical Activity:   . Days of Exercise per Week: Not on file  . Minutes of Exercise per Session: Not on file  Stress:   . Feeling of Stress : Not on file  Social Connections:   . Frequency of Communication with Friends and Family: Not on file  . Frequency of Social Gatherings with Friends  and Family: Not on file  . Attends Religious Services: Not on file  . Active Member of Clubs or Organizations: Not on file  . Attends Archivist Meetings: Not on file  . Marital Status: Not on file  Intimate Partner Violence:   . Fear of Current or Ex-Partner: Not on file  . Emotionally Abused: Not on file  . Physically Abused: Not on file  . Sexually Abused: Not on file    FAMILY HISTORY:  Family History  Problem Relation Age of Onset  . Aneurysm Mother   . Heart attack Father   . Colon cancer Maternal Uncle   . Cancer Brother   . Gastric cancer Neg Hx   . Esophageal cancer Neg Hx     CURRENT MEDICATIONS:  Outpatient Encounter Medications as of 05/24/2019  Medication Sig  . amLODipine (NORVASC) 10 MG tablet Take 1 tablet (10 mg total) by mouth daily.  Marland Kitchen aspirin EC 81 MG tablet Take 81 mg by mouth daily.  . Cholecalciferol (VITAMIN D3) 5000 units CAPS Take 5,000 Units by mouth daily.  Marland Kitchen dabrafenib mesylate (TAFINLAR) 50 MG capsule TAKE 2 CAPSULES (100 MG TOTAL) BY MOUTH 2 (TWO) TIMES DAILY. TAKE ON AN EMPTY STOMACH 1 HOUR BEFORE OR 2 HOURS AFTER MEALS.  Marland Kitchen escitalopram (LEXAPRO) 10 MG tablet Take 10 mg by mouth daily.   Marland Kitchen glimepiride (AMARYL) 2 MG tablet Take 2 mg by mouth daily with breakfast.   . isosorbide mononitrate (IMDUR) 30 MG 24 hr tablet TAKE ONE TABLET (30MG TOTAL) BY MOUTH DAILY  . metFORMIN (GLUCOPHAGE) 500 MG tablet Take 1 tablet (500 mg total) by mouth 2 (two) times daily with a meal.  . metoprolol (LOPRESSOR) 100 MG tablet Take 100 mg by mouth 2 (two) times daily.  . ONE TOUCH ULTRA TEST test strip   . pioglitazone (ACTOS) 45 MG tablet Take 45 mg by mouth daily.  . sitaGLIPtin (JANUVIA) 50 MG tablet Take 50 mg by mouth daily.   . trametinib dimethyl sulfoxide (MEKINIST) 0.5 MG tablet TAKE 2 TABLETS (1 MG TOTAL) BY MOUTH DAILY. TAKE 1 HOUR BEFORE OR 2 HOURS AFTER A MEAL. STORE REFRIGERATED IN ORIGINAL CONTAINER.  Marland Kitchen nitroGLYCERIN (NITROSTAT) 0.4 MG SL  tablet Place 0.4 mg under the tongue every 5 (five) minutes as needed for chest pain.   Marland Kitchen ondansetron (ZOFRAN) 8 MG tablet Take 1 tablet (8 mg total) by mouth every 8 (eight) hours as needed for nausea or vomiting. (Patient not taking: Reported on 04/10/2019)  . prochlorperazine (COMPAZINE) 10 MG tablet Take 1 tablet (10 mg total) by mouth every 6 (six) hours as needed for nausea or vomiting. (Patient not taking: Reported on 04/10/2019)  No facility-administered encounter medications on file as of 05/24/2019.    ALLERGIES:  No Known Allergies   PHYSICAL EXAM:  ECOG Performance status: 1  Vitals:   05/24/19 1451  BP: (!) 153/60  Pulse: 63  Resp: 20  Temp: 97.6 F (36.4 C)  SpO2: 98%   Filed Weights   05/24/19 1451  Weight: 195 lb 6.4 oz (88.6 kg)    Physical Exam Vitals reviewed.  Constitutional:      Appearance: Normal appearance.  Cardiovascular:     Rate and Rhythm: Normal rate and regular rhythm.     Heart sounds: Normal heart sounds.  Pulmonary:     Effort: Pulmonary effort is normal.     Breath sounds: Normal breath sounds.  Abdominal:     General: There is no distension.     Palpations: Abdomen is soft. There is no mass.  Musculoskeletal:        General: Swelling present.  Skin:    General: Skin is warm.  Neurological:     General: No focal deficit present.     Mental Status: He is alert and oriented to person, place, and time.  Psychiatric:        Mood and Affect: Mood normal.        Behavior: Behavior normal.      LABORATORY DATA:  I have reviewed the labs as listed.  CBC    Component Value Date/Time   WBC 7.5 05/16/2019 1316   RBC 4.63 05/16/2019 1316   HGB 12.2 (L) 05/16/2019 1316   HCT 38.4 (L) 05/16/2019 1316   PLT 222 05/16/2019 1316   MCV 82.9 05/16/2019 1316   MCH 26.3 05/16/2019 1316   MCHC 31.8 05/16/2019 1316   RDW 14.2 05/16/2019 1316   LYMPHSABS 1.8 05/16/2019 1316   MONOABS 0.6 05/16/2019 1316   EOSABS 0.1 05/16/2019 1316    BASOSABS 0.0 05/16/2019 1316   CMP Latest Ref Rng & Units 05/16/2019 04/03/2019 02/01/2019  Glucose 70 - 99 mg/dL 195(H) 227(H) 195(H)  BUN 8 - 23 mg/dL '18 23 23  ' Creatinine 0.61 - 1.24 mg/dL 1.51(H) 1.54(H) 1.65(H)  Sodium 135 - 145 mmol/L 135 133(L) 134(L)  Potassium 3.5 - 5.1 mmol/L 4.6 4.8 4.6  Chloride 98 - 111 mmol/L 101 100 100  CO2 22 - 32 mmol/L '26 26 26  ' Calcium 8.9 - 10.3 mg/dL 9.0 8.8(L) 8.8(L)  Total Protein 6.5 - 8.1 g/dL 7.2 6.9 7.0  Total Bilirubin 0.3 - 1.2 mg/dL 0.7 0.7 0.5  Alkaline Phos 38 - 126 U/L 97 93 99  AST 15 - 41 U/L '19 19 18  ' ALT 0 - 44 U/L '16 13 13       ' DIAGNOSTIC IMAGING:  I have independently reviewed the scans and discussed with the patient.   I have reviewed Venita Lick LPN's note and agree with the documentation.  I personally performed a face-to-face visit, made revisions and my assessment and plan is as follows.    ASSESSMENT & PLAN:   Adenocarcinoma of lung, stage 4 ([place]) 1.  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 - 2D echo on 11/09/2018 shows EF of 60-65%, back to baseline from February 2019. -He is tolerating dabrafenib and trametinib very well. -We reviewed CT of the chest from  05/21/2019.  This showed stable disease.  I have independently reviewed images. -We reviewed labs from 05/16/2019 which were also stable. -I plan to see him back in 2 months for follow-up.  We will plan to repeat CT scans in 4 to 6 months.   2.  Hypertension: -He will continue metoprolol and amlodipine. -This is fairly well controlled.  3.  CKD: -Creatinine is stable between 1.5-1.6.     Orders placed this encounter:  Orders Placed This Encounter  Procedures  . CBC with Differential/Platelet  .  Comprehensive metabolic panel  . TSH  . ECHOCARDIOGRAM COMPLETE      Derek Jack, [place] [phone]

## 2019-05-24 NOTE — Patient Instructions (Signed)
[place] at [facility] Discharge Instructions  You were seen today by Dr. Delton Coombes. He went over your recent lab results. He will see you back in 2 months for labs, ECHO and follow up.   Thank you for choosing [place] at [facility] to provide your oncology and hematology care.  To afford each patient quality time with our provider, please arrive at least 15 minutes before your scheduled appointment time.   If you have a lab appointment with the [facility] please come in thru the  Main Entrance and check in at the main information desk  You need to re-schedule your appointment should you arrive 10 or more minutes late.  We strive to give you quality time with our providers, and arriving late affects you and other patients whose appointments are after yours.  Also, if you no show three or more times for appointments you may be dismissed from the clinic at the providers discretion.     Again, thank you for choosing [facility].  Our hope is that these requests will decrease the amount of time that you wait before being seen by our physicians.       _____________________________________________________________  Should you have questions after your visit to [facility], please contact our office at [phone] between the hours of 8:00 a.m. and 4:30 p.m.  Voicemails left after 4:00 p.m. will not be returned until the following business day.  For prescription refill requests, have your pharmacy contact our office and allow 72 hours.    Cancer Center Support Programs:   > Cancer Support Group  2nd Tuesday of the month 1pm-2pm, Journey Room

## 2019-05-24 NOTE — Assessment & Plan Note (Signed)
1.  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 - 2D echo on 11/09/2018 shows EF of 60-65%, back to baseline from February 2019. -He is tolerating dabrafenib and trametinib very well. -We reviewed CT of the chest from 05/21/2019.  This showed stable disease.  I have independently reviewed images. -We reviewed labs from 05/16/2019 which were also stable. -I plan to see him back in 2 months for follow-up.  We will plan to repeat CT scans in 4 to 6 months.   2.  Hypertension: -He will continue metoprolol and amlodipine. -This is fairly well controlled.  3.  CKD: -Creatinine is stable between 1.5-1.6.

## 2019-05-29 NOTE — Telephone Encounter (Signed)
Oral Oncology Patient Advocate Encounter  Received notification from [employer] that the patient has been successfully re-enrolled into their program to receive [place] from the manufacturer at $0 out of pocket until 05/16/2020.    I called and spoke with patient.  He knows we will have to re-apply.   Patient knows to call the office with questions or concerns.   Oral Oncology Clinic will continue to follow.  [facility] Patient Central Phone [phone] Fax [phone] 05/29/2019 11:39 AM

## 2019-07-04 DIAGNOSIS — N183 Chronic kidney disease, stage 3 unspecified: Secondary | ICD-10-CM | POA: Diagnosis not present

## 2019-07-04 DIAGNOSIS — Z79899 Other long term (current) drug therapy: Secondary | ICD-10-CM | POA: Diagnosis not present

## 2019-07-04 DIAGNOSIS — I2510 Atherosclerotic heart disease of native coronary artery without angina pectoris: Secondary | ICD-10-CM | POA: Diagnosis not present

## 2019-07-04 DIAGNOSIS — E1129 Type 2 diabetes mellitus with other diabetic kidney complication: Secondary | ICD-10-CM | POA: Diagnosis not present

## 2019-07-10 ENCOUNTER — Other Ambulatory Visit: Payer: Self-pay | Admitting: Cardiovascular Disease

## 2019-07-10 DIAGNOSIS — I10 Essential (primary) hypertension: Secondary | ICD-10-CM

## 2019-07-16 ENCOUNTER — Other Ambulatory Visit: Payer: Self-pay

## 2019-07-16 ENCOUNTER — Inpatient Hospital Stay (HOSPITAL_COMMUNITY): Payer: Medicare Other | Attending: Hematology

## 2019-07-16 ENCOUNTER — Ambulatory Visit (HOSPITAL_COMMUNITY)
Admission: RE | Admit: 2019-07-16 | Discharge: 2019-07-16 | Disposition: A | Discharge status: Home / Self Care | Payer: Medicare Other | Source: Ambulatory Visit | Attending: Hematology | Admitting: Hematology

## 2019-07-16 DIAGNOSIS — Z7984 Long term (current) use of oral hypoglycemic drugs: Secondary | ICD-10-CM | POA: Diagnosis not present

## 2019-07-16 DIAGNOSIS — C7800 Secondary malignant neoplasm of unspecified lung: Secondary | ICD-10-CM | POA: Diagnosis not present

## 2019-07-16 DIAGNOSIS — E1122 Type 2 diabetes mellitus with diabetic chronic kidney disease: Secondary | ICD-10-CM | POA: Insufficient documentation

## 2019-07-16 DIAGNOSIS — Z0189 Encounter for other specified special examinations: Secondary | ICD-10-CM | POA: Diagnosis not present

## 2019-07-16 DIAGNOSIS — I2510 Atherosclerotic heart disease of native coronary artery without angina pectoris: Secondary | ICD-10-CM | POA: Diagnosis not present

## 2019-07-16 DIAGNOSIS — Z79899 Other long term (current) drug therapy: Secondary | ICD-10-CM | POA: Diagnosis not present

## 2019-07-16 DIAGNOSIS — Z01818 Encounter for other preprocedural examination: Secondary | ICD-10-CM | POA: Diagnosis not present

## 2019-07-16 DIAGNOSIS — Z87891 Personal history of nicotine dependence: Secondary | ICD-10-CM | POA: Insufficient documentation

## 2019-07-16 DIAGNOSIS — I517 Cardiomegaly: Secondary | ICD-10-CM | POA: Diagnosis not present

## 2019-07-16 DIAGNOSIS — R079 Chest pain, unspecified: Secondary | ICD-10-CM | POA: Diagnosis not present

## 2019-07-16 DIAGNOSIS — E119 Type 2 diabetes mellitus without complications: Secondary | ICD-10-CM | POA: Diagnosis not present

## 2019-07-16 DIAGNOSIS — C3490 Malignant neoplasm of unspecified part of unspecified bronchus or lung: Secondary | ICD-10-CM | POA: Diagnosis not present

## 2019-07-16 DIAGNOSIS — N189 Chronic kidney disease, unspecified: Secondary | ICD-10-CM | POA: Diagnosis not present

## 2019-07-16 DIAGNOSIS — I129 Hypertensive chronic kidney disease with stage 1 through stage 4 chronic kidney disease, or unspecified chronic kidney disease: Secondary | ICD-10-CM | POA: Insufficient documentation

## 2019-07-16 DIAGNOSIS — Z951 Presence of aortocoronary bypass graft: Secondary | ICD-10-CM | POA: Insufficient documentation

## 2019-07-16 LAB — CBC WITH DIFFERENTIAL/PLATELET
Abs Immature Granulocytes: 0.02 10*3/uL (ref 0.00–0.07)
Basophils Absolute: 0 10*3/uL (ref 0.0–0.1)
Basophils Relative: 0 %
Eosinophils Absolute: 0.1 10*3/uL (ref 0.0–0.5)
Eosinophils Relative: 1 %
HCT: 40.5 % (ref 39.0–52.0)
Hemoglobin: 13.4 g/dL (ref 13.0–17.0)
Immature Granulocytes: 0 %
Lymphocytes Relative: 28 %
Lymphs Abs: 2 10*3/uL (ref 0.7–4.0)
MCH: 27.4 pg (ref 26.0–34.0)
MCHC: 33.1 g/dL (ref 30.0–36.0)
MCV: 82.8 fL (ref 80.0–100.0)
Monocytes Absolute: 0.6 10*3/uL (ref 0.1–1.0)
Monocytes Relative: 8 %
Neutro Abs: 4.5 10*3/uL (ref 1.7–7.7)
Neutrophils Relative %: 63 %
Platelets: 252 10*3/uL (ref 150–400)
RBC: 4.89 MIL/uL (ref 4.22–5.81)
RDW: 15.1 % (ref 11.5–15.5)
WBC: 7.2 10*3/uL (ref 4.0–10.5)
nRBC: 0 % (ref 0.0–0.2)

## 2019-07-16 LAB — COMPREHENSIVE METABOLIC PANEL
ALT: 15 U/L (ref 0–44)
AST: 18 U/L (ref 15–41)
Albumin: 3.7 g/dL (ref 3.5–5.0)
Alkaline Phosphatase: 103 U/L (ref 38–126)
Anion gap: 7 (ref 5–15)
BUN: 22 mg/dL (ref 8–23)
CO2: 28 mmol/L (ref 22–32)
Calcium: 9 mg/dL (ref 8.9–10.3)
Chloride: 97 mmol/L — ABNORMAL LOW (ref 98–111)
Creatinine, Ser: 1.89 mg/dL — ABNORMAL HIGH (ref 0.61–1.24)
GFR calc Af Amer: 38 mL/min — ABNORMAL LOW (ref >60)
GFR calc non Af Amer: 33 mL/min — ABNORMAL LOW (ref >60)
Glucose, Bld: 248 mg/dL — ABNORMAL HIGH (ref 70–99)
Potassium: 4.7 mmol/L (ref 3.5–5.1)
Sodium: 132 mmol/L — ABNORMAL LOW (ref 135–145)
Total Bilirubin: 0.8 mg/dL (ref 0.3–1.2)
Total Protein: 7.3 g/dL (ref 6.5–8.1)

## 2019-07-16 LAB — TSH: TSH: 2.166 u[IU]/mL (ref 0.350–4.500)

## 2019-07-16 IMAGING — DX DG CHEST 2V
2 series · 2 of 2 positions shown · non-contrast
Comparison: 08/18/2017

CLINICAL DATA: Chest pain and cough since 10 p.m. last night.
History of diabetes, lung cancer, heart disease. Former smoker.

EXAM:
CHEST - 2 VIEW

[chest pa]
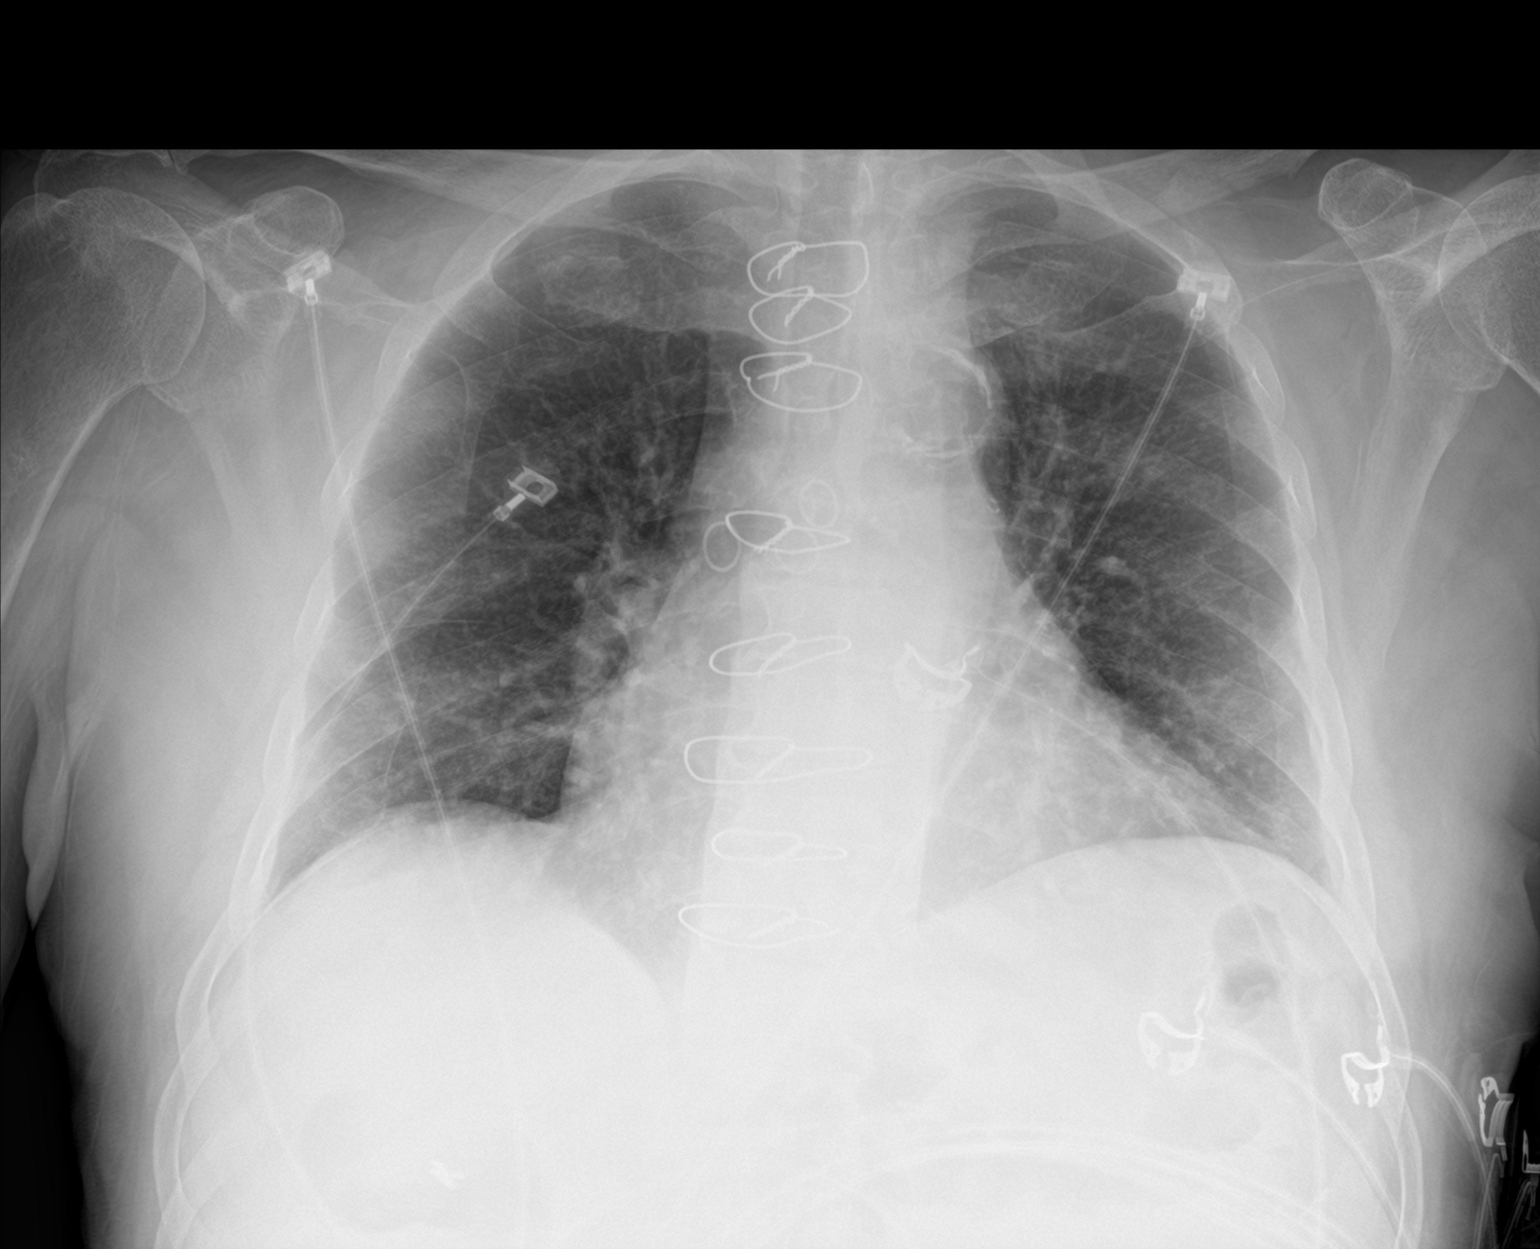

[chest lat]
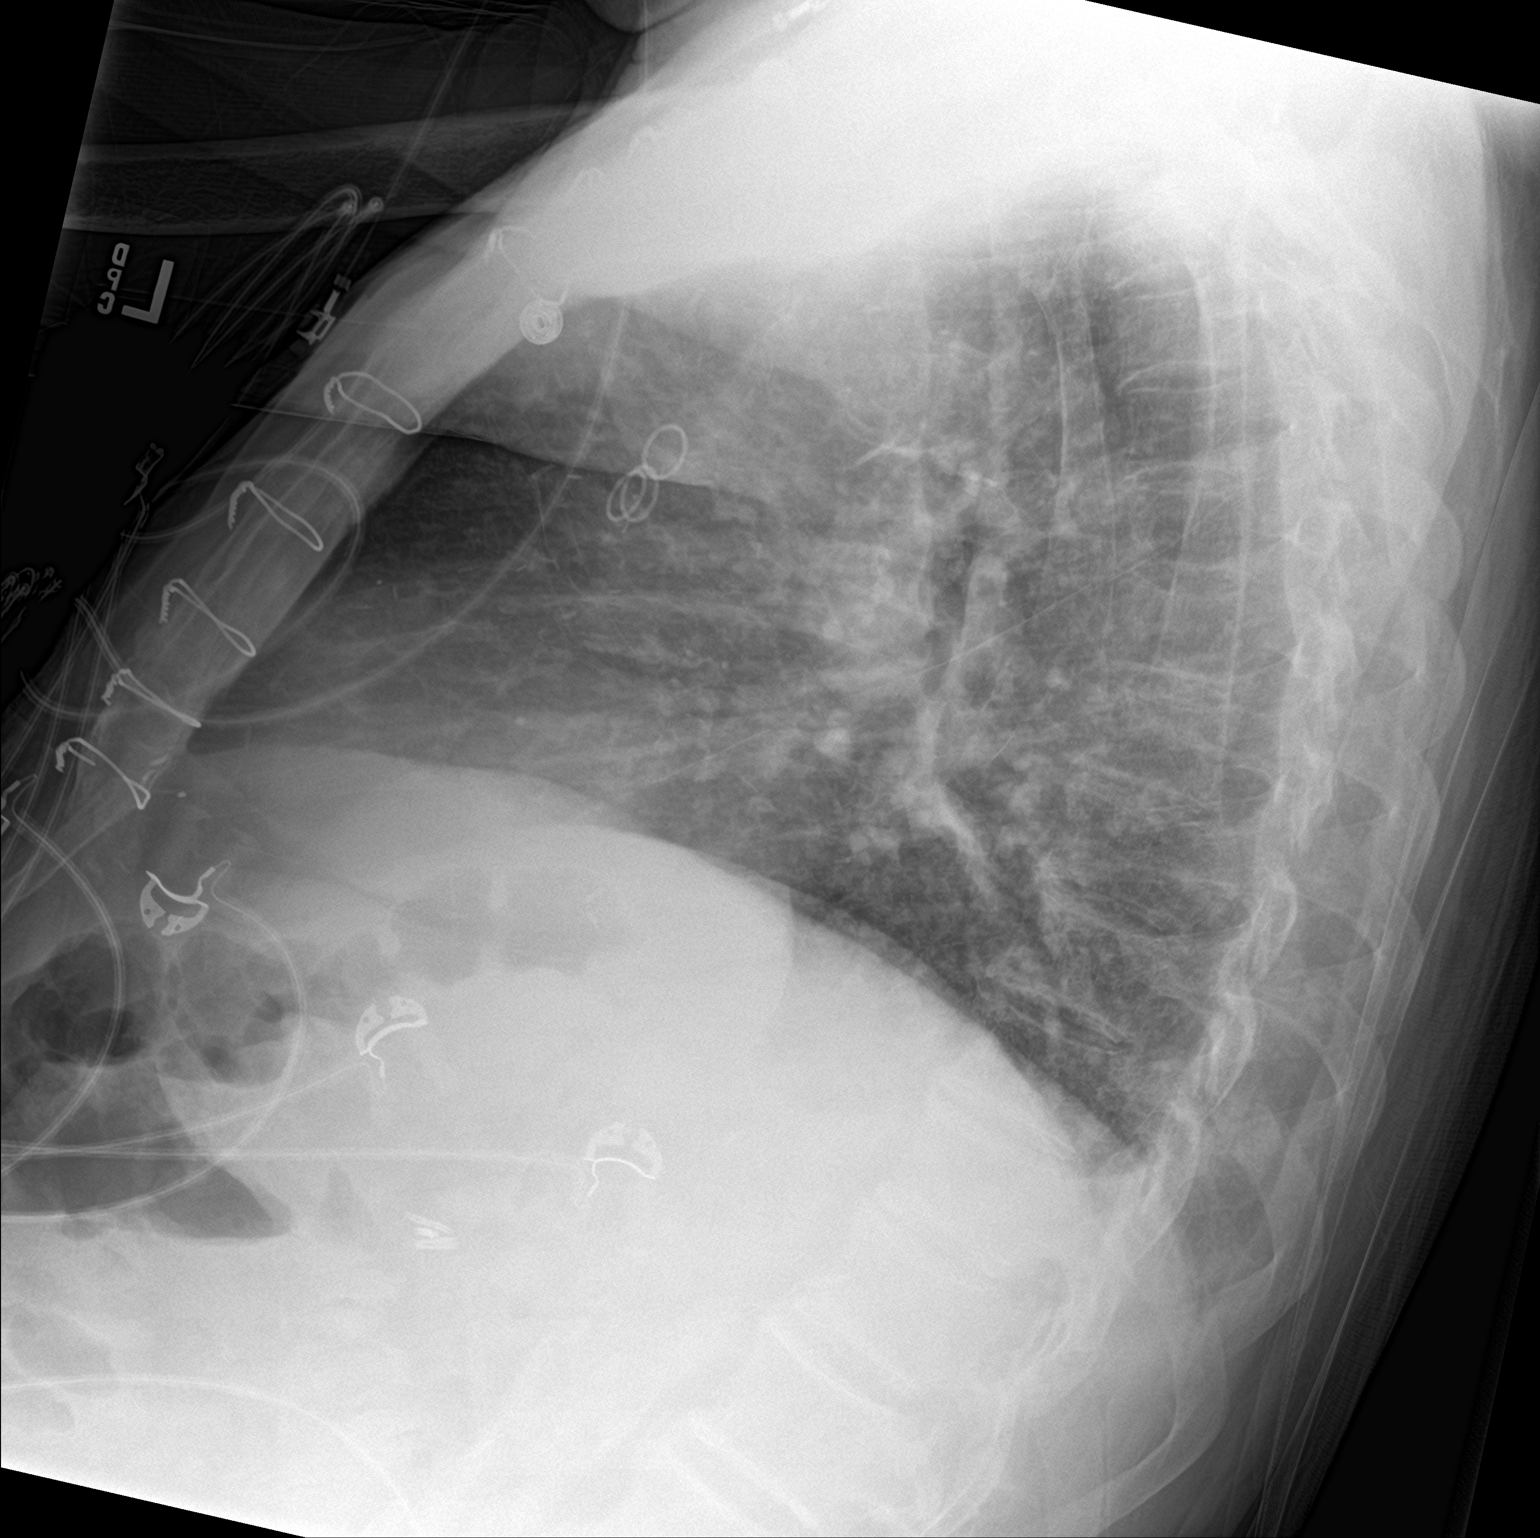

[2 of 2 positions shown; findings below may reference images not displayed]

FINDINGS: Postoperative changes in the mediastinum. Heart size and pulmonary
vascularity are normal for technique. Shallow inspiration. Lungs are
clear. No blunting of costophrenic angles. No pneumothorax.
Mediastinal contours appear intact. Calcification of the aorta.
IMPRESSION: Shallow inspiration. No evidence of active pulmonary disease. Aortic
atherosclerosis.

## 2019-07-16 NOTE — Progress Notes (Signed)
*  PRELIMINARY RESULTS* Echocardiogram 2D Echocardiogram has been performed.  [name] 07/16/2019, 10:51 AM

## 2019-07-26 ENCOUNTER — Inpatient Hospital Stay (HOSPITAL_BASED_OUTPATIENT_CLINIC_OR_DEPARTMENT_OTHER): Payer: Medicare Other | Admitting: Hematology

## 2019-07-26 ENCOUNTER — Encounter (HOSPITAL_COMMUNITY): Payer: Self-pay | Admitting: Hematology

## 2019-07-26 ENCOUNTER — Other Ambulatory Visit: Payer: Self-pay

## 2019-07-26 DIAGNOSIS — C3490 Malignant neoplasm of unspecified part of unspecified bronchus or lung: Secondary | ICD-10-CM | POA: Diagnosis not present

## 2019-07-26 DIAGNOSIS — I129 Hypertensive chronic kidney disease with stage 1 through stage 4 chronic kidney disease, or unspecified chronic kidney disease: Secondary | ICD-10-CM | POA: Diagnosis not present

## 2019-07-26 DIAGNOSIS — E1122 Type 2 diabetes mellitus with diabetic chronic kidney disease: Secondary | ICD-10-CM | POA: Diagnosis not present

## 2019-07-26 DIAGNOSIS — Z7984 Long term (current) use of oral hypoglycemic drugs: Secondary | ICD-10-CM | POA: Diagnosis not present

## 2019-07-26 DIAGNOSIS — I2510 Atherosclerotic heart disease of native coronary artery without angina pectoris: Secondary | ICD-10-CM | POA: Diagnosis not present

## 2019-07-26 DIAGNOSIS — C7800 Secondary malignant neoplasm of unspecified lung: Secondary | ICD-10-CM | POA: Diagnosis not present

## 2019-07-26 DIAGNOSIS — N189 Chronic kidney disease, unspecified: Secondary | ICD-10-CM | POA: Diagnosis not present

## 2019-07-26 DIAGNOSIS — Z79899 Other long term (current) drug therapy: Secondary | ICD-10-CM | POA: Diagnosis not present

## 2019-07-26 DIAGNOSIS — Z87891 Personal history of nicotine dependence: Secondary | ICD-10-CM | POA: Diagnosis not present

## 2019-07-26 NOTE — Assessment & Plan Note (Addendum)
1.  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 -He is continuing to tolerate dabrafenib intermittent very well. -We reviewed CT CAP on 05/21/2019 which showed multifocal pulmonary metastasis which is stable.  No new progressive disease identified in the chest abdomen or pelvis. -We have reviewed labs from 07/16/2019.  CBC is normal.  Creatinine is elevated at 1.89.  LFTs are normal. -Increased serum creatinine could be one of the side effects of the combination pill. -He denies any fevers.  I will see him back in 2 months with a repeat CT scan of the chest.  2.  High risk drug monitoring: -We reviewed results of 2D echocardiogram from 07/16/2019 which showed EF 60-65%.  No regional wall motion abnormalities.  Mild left ventricular hypertrophy.  Grade 1 diastolic dysfunction. -2D echo from 11/09/2018 shows EF 60-65%.  3.  Hypertension: -He will continue metoprolol and amlodipine.  Blood pressure today is 132/52.  4.  CKD: -His baseline creatinine is around 1.5-1.8. -Today it is 1.89, likely from combination pills for his lung cancer.  We will closely monitor it.

## 2019-07-26 NOTE — Patient Instructions (Addendum)
[place] at [facility] Discharge Instructions  You were seen today by Dr. Delton Coombes. He went over your recent lab results. Be sure to drink lots of water each day. He will see you back in 2 months for labs, scan and follow up.   Thank you for choosing [place] at [facility] to provide your oncology and hematology care.  To afford each patient quality time with our provider, please arrive at least 15 minutes before your scheduled appointment time.   If you have a lab appointment with the [facility] please come in thru the  Main Entrance and check in at the main information desk  You need to re-schedule your appointment should you arrive 10 or more minutes late.  We strive to give you quality time with our providers, and arriving late affects you and other patients whose appointments are after yours.  Also, if you no show three or more times for appointments you may be dismissed from the clinic at the providers discretion.     Again, thank you for choosing [facility].  Our hope is that these requests will decrease the amount of time that you wait before being seen by our physicians.       _____________________________________________________________  Should you have questions after your visit to [facility], please contact our office at [phone] between the hours of 8:00 a.m. and 4:30 p.m.  Voicemails left after 4:00 p.m. will not be returned until the following business day.  For prescription refill requests, have your pharmacy contact our office and allow 72 hours.    Cancer Center Support Programs:   > Cancer Support Group  2nd Tuesday of the month 1pm-2pm, Journey Room

## 2019-07-26 NOTE — Progress Notes (Signed)
[place], [place] [address]   CLINIC:  Medical Oncology/Hematology  PCP:  [name], MD [address] [place] [address] [phone]   REASON FOR VISIT:  Follow-up for NSCLC     INTERVAL HISTORY:  [name] 79 y.o. male seen for follow-up of metastatic non-small cell lung cancer.  He is taking dabrafenib and trametinib on empty stomach in the morning and dabrafenib in the evening.  Reports appetite and energy levels of 100%.  Denies any cough or hemoptysis.  No headaches or vision changes noted.  REVIEW OF SYSTEMS:  Review of Systems  All other systems reviewed and are negative.    PAST MEDICAL/SURGICAL HISTORY:  Past Medical History:  Diagnosis Date  . Cancer ([place])   . Chronic kidney disease   . Coronary artery disease   . Diabetes mellitus   . Hypertension   . 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 Spontaneous pneumothorax 09/15/2010   right   Past Surgical History:  Procedure Laterality Date  . ANKLE FRACTURE SURGERY  2008   [facility]  . APPENDECTOMY  1960's  . CHEST TUBE INSERTION Right 09/15/2010   Dr Servando Snare - spontaneous PTX  . CHOLECYSTECTOMY  2008  . COLONOSCOPY N/A 09/07/2017   Procedure: COLONOSCOPY;  Surgeon: Daneil Dolin, MD;  [facility];  Service: Endoscopy;  Laterality: N/A;  10:30am  . CORONARY ARTERY BYPASS GRAFT  2003  . EYE SURGERY  2001   Cataracts removed bilaterally  . INCISIONAL HERNIA REPAIR N/A 01/29/2015   Procedure: Fatima Blank HERNIORRHAPHY WITH MESH;  Surgeon: Aviva Signs, MD;  Location: [facility] ORS;  Service: General;  Laterality: N/A;  . INSERTION OF MESH N/A 01/29/2015   Procedure: INSERTION OF MESH;  Surgeon: Aviva Signs, MD;  Location: [facility] ORS;  Service: General;  Laterality: N/A;  . [place]  . LEFT HEART CATH AND  CORS/GRAFTS ANGIOGRAPHY N/A 06/20/2017   Procedure: LEFT HEART CATH AND CORS/GRAFTS ANGIOGRAPHY;  Surgeon: Martinique, Peter M, MD;  Location: [facility] CV LAB;  Service: Cardiovascular;  Laterality: N/A;  . ORIF ANKLE FRACTURE  08/26/2011   Procedure: OPEN REDUCTION INTERNAL FIXATION (ORIF) ANKLE FRACTURE;  Surgeon: Sanjuana Kava, MD;  Location: [facility] ORS;  Service: Orthopedics;  Laterality: Right;  . POLYPECTOMY  09/07/2017   Procedure: POLYPECTOMY;  Surgeon: Daneil Dolin, MD;  Location[facility];  Service: Endoscopy;;  ascending x2 (cold snare)  . PROSTATE SURGERY  2012   Enlarged prostate  . TRANSURETHRAL RESECTION OF PROSTATE  2012     SOCIAL HISTORY:  Social History   Socioeconomic History  . Marital status: Married    Spouse name: Not on file  . Number of children: Not on file  . Years of education: Not on file  . Highest education level: Not on file  Occupational History  . Not on file  Tobacco Use  . Smoking status: Former Smoker    Packs/day: 1.00    Years: 33.00    Pack years: 33.00    Quit date: 05/17/1987    Years since quitting: 32.2  . Smokeless tobacco: Never Used  Substance and Sexual Activity  . Alcohol use: No  . Drug use: No  . Sexual activity: Yes  Other Topics Concern  . Not on  file  Social History Narrative  . Not on file   Social Determinants of Health   Financial Resource Strain:   . Difficulty of Paying Living Expenses:   Food Insecurity:   . Worried About Charity fundraiser in the Last Year:   . Arboriculturist in the Last Year:   Transportation Needs:   . Film/video editor (Medical):   Marland Kitchen Lack of Transportation (Non-Medical):   Physical Activity:   . Days of Exercise per Week:   . Minutes of Exercise per Session:   Stress:   . Feeling of Stress :   Social Connections:   . Frequency of Communication with Friends and Family:   . Frequency of Social Gatherings with Friends and Family:   . Attends Religious Services:   . Active  Member of Clubs or Organizations:   . Attends Archivist Meetings:   Marland Kitchen Marital Status:   Intimate Partner Violence:   . Fear of Current or Ex-Partner:   . Emotionally Abused:   Marland Kitchen Physically Abused:   . Sexually Abused:     FAMILY HISTORY:  Family History  Problem Relation Age of Onset  . Aneurysm Mother   . Heart attack Father   . Colon cancer Maternal Uncle   . Cancer Brother   . Gastric cancer Neg Hx   . Esophageal cancer Neg Hx     CURRENT MEDICATIONS:  Outpatient Encounter Medications as of 07/26/2019  Medication Sig  . amLODipine (NORVASC) 10 MG tablet TAKE ONE TABLET (10MG TOTAL) BY MOUTH DAILY  . aspirin EC 81 MG tablet Take 81 mg by mouth daily.  . Cholecalciferol (VITAMIN D3) 5000 units CAPS Take 5,000 Units by mouth daily.  Marland Kitchen dabrafenib mesylate (TAFINLAR) 50 MG capsule TAKE 2 CAPSULES (100 MG TOTAL) BY MOUTH 2 (TWO) TIMES DAILY. TAKE ON AN EMPTY STOMACH 1 HOUR BEFORE OR 2 HOURS AFTER MEALS.  Marland Kitchen escitalopram (LEXAPRO) 10 MG tablet Take 10 mg by mouth daily.   Marland Kitchen glimepiride (AMARYL) 2 MG tablet Take 2 mg by mouth daily with breakfast.   . isosorbide mononitrate (IMDUR) 30 MG 24 hr tablet TAKE ONE TABLET (30MG TOTAL) BY MOUTH DAILY  . metFORMIN (GLUCOPHAGE) 500 MG tablet Take 1 tablet (500 mg total) by mouth 2 (two) times daily with a meal.  . metoprolol (LOPRESSOR) 100 MG tablet Take 100 mg by mouth 2 (two) times daily.  . nitroGLYCERIN (NITROSTAT) 0.4 MG SL tablet Place 0.4 mg under the tongue every 5 (five) minutes as needed for chest pain.   Marland Kitchen ondansetron (ZOFRAN) 8 MG tablet Take 1 tablet (8 mg total) by mouth every 8 (eight) hours as needed for nausea or vomiting.  . ONE TOUCH ULTRA TEST test strip   . pioglitazone (ACTOS) 45 MG tablet Take 45 mg by mouth daily.  . prochlorperazine (COMPAZINE) 10 MG tablet Take 1 tablet (10 mg total) by mouth every 6 (six) hours as needed for nausea or vomiting.  . sitaGLIPtin (JANUVIA) 50 MG tablet Take 50 mg by mouth  daily.   . trametinib dimethyl sulfoxide (MEKINIST) 0.5 MG tablet TAKE 2 TABLETS (1 MG TOTAL) BY MOUTH DAILY. TAKE 1 HOUR BEFORE OR 2 HOURS AFTER A MEAL. STORE REFRIGERATED IN ORIGINAL CONTAINER.   No facility-administered encounter medications on file as of 07/26/2019.    ALLERGIES:  No Known Allergies   PHYSICAL EXAM:  ECOG Performance status: 1  Vitals:   07/26/19 1400  BP: (!) 132/52  Pulse: 61  Resp: 16  Temp: (!) 97.3 F (36.3 C)  SpO2: 97%   Filed Weights   07/26/19 1400  Weight: 198 lb 4 oz (89.9 kg)    Physical Exam Vitals reviewed.  Constitutional:      Appearance: Normal appearance.  Cardiovascular:     Rate and Rhythm: Normal rate and regular rhythm.     Heart sounds: Normal heart sounds.  Pulmonary:     Effort: Pulmonary effort is normal.     Breath sounds: Normal breath sounds.  Abdominal:     General: There is no distension.     Palpations: Abdomen is soft. There is no mass.  Musculoskeletal:        General: Swelling present.  Skin:    General: Skin is warm.  Neurological:     General: No focal deficit present.     Mental Status: He is alert and oriented to person, place, and time.  Psychiatric:        Mood and Affect: Mood normal.        Behavior: Behavior normal.      LABORATORY DATA:  I have reviewed the labs as listed.  CBC    Component Value Date/Time   WBC 7.2 07/16/2019 0943   RBC 4.89 07/16/2019 0943   HGB 13.4 07/16/2019 0943   HCT 40.5 07/16/2019 0943   PLT 252 07/16/2019 0943   MCV 82.8 07/16/2019 0943   MCH 27.4 07/16/2019 0943   MCHC 33.1 07/16/2019 0943   RDW 15.1 07/16/2019 0943   LYMPHSABS 2.0 07/16/2019 0943   MONOABS 0.6 07/16/2019 0943   EOSABS 0.1 07/16/2019 0943   BASOSABS 0.0 07/16/2019 0943   CMP Latest Ref Rng & Units 07/16/2019 05/16/2019 04/03/2019  Glucose 70 - 99 mg/dL 248(H) 195(H) 227(H)  BUN 8 - 23 mg/dL _0 Creatinine 0.61 - 1.24 mg/dL 1.89(H) 1.51(H) 1.54(H)  Sodium 135 - 145 mmol/L 132(L)  135 133(L)  Potassium 3.5 - 5.1 mmol/L 4.7 4.6 4.8  Chloride 98 - 111 mmol/L 97(L) 101 100  CO2 22 - 32 mmol/L _1 Calcium 8.9 - 10.3 mg/dL 9.0 9.0 8.8(L)  Total Protein 6.5 - 8.1 g/dL 7.3 7.2 6.9  Total Bilirubin 0.3 - 1.2 mg/dL 0.8 0.7 0.7  Alkaline Phos 38 - 126 U/L 103 97 93  AST 15 - 41 U/L _2 ALT 0 - 44 U/L _3 DIAGNOSTIC IMAGING:  I have independently reviewed the scans.   I have reviewed Venita Lick LPN's note and agree with the documentation.  I personally performed a face-to-face visit, made revisions and my assessment and plan is as follows.    ASSESSMENT & PLAN:   Adenocarcinoma of lung, stage 4 ([place]) 1.  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 -He is continuing to tolerate dabrafenib intermittent very well. -We reviewed CT CAP on 05/21/2019 which showed multifocal pulmonary metastasis which is stable.  No new progressive disease identified in the chest abdomen or pelvis. -We have reviewed labs from 07/16/2019.  CBC is normal.  Creatinine is elevated at 1.89.  LFTs are normal. -Increased serum creatinine could be one of the side effects of the combination pill.  2.  High risk drug monitoring: -We reviewed results of 2D echocardiogram from 07/16/2019 which showed EF 60-65%.  No regional  wall motion abnormalities.  Mild left ventricular hypertrophy.  Grade 1 diastolic dysfunction. -2D echo from 11/09/2018 shows EF 60-65%.  3.  Hypertension: -He will continue metoprolol and amlodipine.  Blood pressure today is 132/52.  4.  CKD: -His baseline creatinine is around 1.5-1.8. -Today it is 1.89, likely from combination pills for his lung cancer.  We will closely monitor it.     Orders placed this encounter:  No orders of the defined types were placed in this encounter.     Derek Jack, MD [place] [phone]

## 2019-09-25 ENCOUNTER — Ambulatory Visit (HOSPITAL_COMMUNITY): Payer: Medicare Other | Admitting: Hematology

## 2019-09-25 ENCOUNTER — Telehealth: Payer: Self-pay | Admitting: Cardiovascular Disease

## 2019-09-25 ENCOUNTER — Ambulatory Visit (HOSPITAL_COMMUNITY): Payer: Medicare Other

## 2019-09-25 NOTE — Telephone Encounter (Signed)
°  Patient Consent for Virtual Visit         [name] has provided verbal consent on 09/25/2019 for a virtual visit (video or telephone).   CONSENT FOR VIRTUAL VISIT FOR:  [name]  By participating in this virtual visit I agree to the following:  I hereby voluntarily request, consent and authorize [facility] and its employed or contracted physicians, physician assistants, nurse practitioners or other licensed health care professionals (the Practitioner), to provide me with telemedicine health care services (the Services") as deemed necessary by the treating Practitioner. I acknowledge and consent to receive the Services by the Practitioner via telemedicine. I understand that the telemedicine visit will involve communicating with the Practitioner through live audiovisual communication technology and the disclosure of certain medical information by electronic transmission. I acknowledge that I have been given the opportunity to request an in-person assessment or other available alternative prior to the telemedicine visit and am voluntarily participating in the telemedicine visit.  I understand that I have the right to withhold or withdraw my consent to the use of telemedicine in the course of my care at any time, without affecting my right to future care or treatment, and that the Practitioner or I may terminate the telemedicine visit at any time. I understand that I have the right to inspect all information obtained and/or recorded in the course of the telemedicine visit and may receive copies of available information for a reasonable fee.  I understand that some of the potential risks of receiving the Services via telemedicine include:   Delay or interruption in medical evaluation due to technological equipment failure or disruption;  Information transmitted may not be sufficient (e.g. poor resolution of images) to allow for appropriate medical decision making by the Practitioner;  and/or   In rare instances, security protocols could fail, causing a breach of personal health information.  Furthermore, I acknowledge that it is my responsibility to provide information about my medical history, conditions and care that is complete and accurate to the best of my ability. I acknowledge that Practitioner's advice, recommendations, and/or decision may be based on factors not within their control, such as incomplete or inaccurate data provided by me or distortions of diagnostic images or specimens that may result from electronic transmissions. I understand that the practice of medicine is not an exact science and that Practitioner makes no warranties or guarantees regarding treatment outcomes. I acknowledge that a copy of this consent can be made available to me via my patient portal ([facility]), or I can request a printed copy by calling the office of [facility].    I understand that my insurance will be billed for this visit.   I have read or had this consent read to me.  I understand the contents of this consent, which adequately explains the benefits and risks of the Services being provided via telemedicine.   I have been provided ample opportunity to ask questions regarding this consent and the Services and have had my questions answered to my satisfaction.  I give my informed consent for the services to be provided through the use of telemedicine in my medical care

## 2019-09-26 ENCOUNTER — Telehealth (INDEPENDENT_AMBULATORY_CARE_PROVIDER_SITE_OTHER): Payer: Medicare Other | Admitting: Cardiovascular Disease

## 2019-09-26 ENCOUNTER — Encounter: Payer: Self-pay | Admitting: Cardiovascular Disease

## 2019-09-26 ENCOUNTER — Other Ambulatory Visit: Payer: Self-pay

## 2019-09-26 ENCOUNTER — Ambulatory Visit (HOSPITAL_COMMUNITY)
Admission: RE | Admit: 2019-09-26 | Discharge: 2019-09-26 | Disposition: A | Discharge status: Home / Self Care | Payer: Medicare Other | Source: Ambulatory Visit | Attending: Hematology | Admitting: Hematology

## 2019-09-26 VITALS — BP 151/69 | HR 54 | Ht 70.0 in | Wt 198.0 lb

## 2019-09-26 DIAGNOSIS — I25708 Atherosclerosis of coronary artery bypass graft(s), unspecified, with other forms of angina pectoris: Secondary | ICD-10-CM

## 2019-09-26 DIAGNOSIS — C3490 Malignant neoplasm of unspecified part of unspecified bronchus or lung: Secondary | ICD-10-CM | POA: Diagnosis not present

## 2019-09-26 DIAGNOSIS — I10 Essential (primary) hypertension: Secondary | ICD-10-CM

## 2019-09-26 DIAGNOSIS — N183 Chronic kidney disease, stage 3 unspecified: Secondary | ICD-10-CM | POA: Diagnosis not present

## 2019-09-26 LAB — POCT I-STAT CREATININE: Creatinine, Ser: 1.9 mg/dL — ABNORMAL HIGH (ref 0.61–1.24)

## 2019-09-26 MED ORDER — IOHEXOL 300 MG/ML  SOLN
75.0000 mL | Freq: Once | INTRAMUSCULAR | Status: AC | PRN
  Administered 2019-09-26: 60 mL via INTRAVENOUS

## 2019-09-26 NOTE — Patient Instructions (Signed)
Medication Instructions:  Your physician recommends that you continue on your current medications as directed. Please refer to the Current Medication list given to you today.  *If you need a refill on your cardiac medications before your next appointment, please call your pharmacy*   Lab Work: NONE   If you have labs (blood work) drawn today and your tests are completely normal, you will receive your results only by: Marland Kitchen MyChart Message (if you have MyChart) OR . A paper copy in the mail If you have any lab test that is abnormal or we need to change your treatment, we will call you to review the results.   Testing/Procedures: NONE   Follow-Up: At [facility], you and your health needs are our priority.  As part of our continuing mission to provide you with exceptional heart care, we have created designated Provider Care Teams.  These Care Teams include your primary Cardiologist (physician) and Advanced Practice Providers (APPs -  Physician Assistants and Nurse Practitioners) who all work together to provide you with the care you need, when you need it.  We recommend signing up for the patient portal called "MyChart".  Sign up information is provided on this After Visit Summary.  MyChart is used to connect with patients for Virtual Visits (Telemedicine).  Patients are able to view lab/test results, encounter notes, upcoming appointments, etc.  Non-urgent messages can be sent to your provider as well.   To learn more about what you can do with MyChart, go to [email or web address].    Your next appointment:   6 month(s)  The format for your next appointment:   In Person  Provider:   Kate Sable, MD   Other Instructions Thank you for choosing [facility]!

## 2019-09-26 NOTE — Progress Notes (Signed)
Virtual Visit via Telephone Note   This visit type was conducted due to national recommendations for restrictions regarding the COVID-19 Pandemic (e.g. social distancing) in an effort to limit this patient's exposure and mitigate transmission in our community.  Due to his co-morbid illnesses, this patient is at least at moderate risk for complications without adequate follow up.  This format is felt to be most appropriate for this patient at this time.  The patient did not have access to video technology/had technical difficulties with video requiring transitioning to audio format only (telephone).  All issues noted in this document were discussed and addressed.  No physical exam could be performed with this format.  Please refer to the patient's chart for his  consent to telehealth for [facility].   Date:  09/26/2019   ID:  [name], DOB May 21, 1940, MRN [id number]  Patient Location: Home Provider Location: Office  PCP:  [name], MD  Cardiologist:  Kate Sable, MD  Electrophysiologist:  None   Evaluation Performed:  Follow-Up Visit  Chief Complaint:  CAD  History of Present Illness:    [name] is a 79 y.o. male with coronary artery disease.  He underwent coronary angiography in February 2019 which demonstratedsevere native three-vessel obstructive coronary disease, patent LIMA to the LAD, and severe SVG disease with a 99% proximal SVG stenosis to the diagonal with TIMI I flow and multiple high-grade lesions in the SVG to the OM and SVG to the RCA. Dr. Martinique recommended consideration for redo CABG. He was evaluated by Dr. 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  Echocardiogram on6/14/2019 showed normal left ventricular systolic function and regional wall motion, LVEF 60%. There was mild mitral and tricuspid regurgitation with mildly reduced right ventricular  systolic function, pulmonary pressures 41 mmHg.  Chronic exertional dyspnea is stable.  He previously told me statins in the past led to diarrhea. His PCP tried several different agents.  He denies chest pain and palpitations. He said he feels well.  He continues to work in his shop. He enjoys playing with his great grandchildren.   Social history:He enjoys working with his hands and working in his shop in his backyard. 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 He used to read his sisters'books and study.   Past Medical History:  Diagnosis Date  . Cancer ([place])   . Chronic kidney disease   . Coronary artery disease   . Diabetes mellitus   . Hypertension   . 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 Spontaneous pneumothorax 09/15/2010   right   Past Surgical History:  Procedure Laterality Date  . ANKLE FRACTURE SURGERY  2008   [facility]  . APPENDECTOMY  1960's  . CHEST TUBE INSERTION Right 09/15/2010   Dr Servando Snare - spontaneous PTX  . CHOLECYSTECTOMY  2008  . COLONOSCOPY N/A 09/07/2017   Procedure: COLONOSCOPY;  Surgeon: Daneil Dolin, MD;  [facility];  Service: Endoscopy;  Laterality: N/A;  10:30am  . CORONARY ARTERY BYPASS GRAFT  2003  .  EYE SURGERY  2001   Cataracts removed bilaterally  . INCISIONAL HERNIA REPAIR N/A 01/29/2015   Procedure: Fatima Blank HERNIORRHAPHY WITH MESH;  Surgeon: Aviva Signs, MD;  Location: [facility];  Service: General;  Laterality: N/A;  . INSERTION OF MESH N/A 01/29/2015   Procedure: INSERTION OF MESH;  Surgeon: Aviva Signs, MD;  Location: [facility] ORS;  Service: General;  Laterality: N/A;  . [place]  . LEFT HEART CATH AND  CORS/GRAFTS ANGIOGRAPHY N/A 06/20/2017   Procedure: LEFT HEART CATH AND CORS/GRAFTS ANGIOGRAPHY;  Surgeon: Martinique, Peter M, MD;  Location: [facility] CV LAB;  Service: Cardiovascular;  Laterality: N/A;  . ORIF ANKLE FRACTURE  08/26/2011   Procedure: OPEN REDUCTION INTERNAL FIXATION (ORIF) ANKLE FRACTURE;  Surgeon: Sanjuana Kava, MD;  Location: [facility] OR[facility];  Service: Orthopedics;  Laterality: Right;  . POLYPECTOMY  09/07/2017   Procedure: POLYPECTOMY;  Surgeon: Daneil Dolin, MD;  Location[facility];  Service: Endoscopy;;  ascending x2 (cold snare)  . PROSTATE SURGERY  2012   Enlarged prostate  . TRANSURETHRAL RESECTION OF PROSTATE  2012     Current Meds  Medication Sig  . amLODipine (NORVASC) 10 MG tablet TAKE ONE TABLET (10MG  TOTAL) BY MOUTH DAILY  . aspirin EC 81 MG tablet Take 81 mg by mouth daily.  . Cholecalciferol (VITAMIN D3) 5000 units CAPS Take 5,000 Units by mouth daily.  Marland Kitchen dabrafenib mesylate (TAFINLAR) 50 MG capsule TAKE 2 CAPSULES (100 MG TOTAL) BY MOUTH 2 (TWO) TIMES DAILY. TAKE ON AN EMPTY STOMACH 1 HOUR BEFORE OR 2 HOURS AFTER MEALS.  Marland Kitchen escitalopram (LEXAPRO) 10 MG tablet Take 10 mg by mouth daily.   Marland Kitchen glimepiride (AMARYL) 2 MG tablet Take 2 mg by mouth daily with breakfast.   . isosorbide mononitrate (IMDUR) 30 MG 24 hr tablet TAKE ONE TABLET (30MG  TOTAL) BY MOUTH DAILY  . metFORMIN (GLUCOPHAGE) 500 MG tablet Take 1 tablet (500 mg total) by mouth 2 (two) times daily with a meal.  . metoprolol (LOPRESSOR) 100 MG tablet Take 100 mg by mouth 2 (two) times daily.  . nitroGLYCERIN (NITROSTAT) 0.4 MG SL tablet Place 0.4 mg under the tongue every 5 (five) minutes as needed for chest pain.   Marland Kitchen ondansetron (ZOFRAN) 8 MG tablet Take 1 tablet (8 mg total) by mouth every 8 (eight) hours as needed for nausea or vomiting.  . ONE TOUCH ULTRA TEST test strip   . pioglitazone (ACTOS) 45 MG tablet Take 45 mg by mouth daily.  . prochlorperazine (COMPAZINE) 10 MG tablet Take 1 tablet (10 mg  total) by mouth every 6 (six) hours as needed for nausea or vomiting.  . sitaGLIPtin (JANUVIA) 50 MG tablet Take 50 mg by mouth daily.   . trametinib dimethyl sulfoxide (MEKINIST) 0.5 MG tablet TAKE 2 TABLETS (1 MG TOTAL) BY MOUTH DAILY. TAKE 1 HOUR BEFORE OR 2 HOURS AFTER A MEAL. STORE REFRIGERATED IN ORIGINAL CONTAINER.     Allergies:   Patient has no known allergies.   Social History   Tobacco Use  . Smoking status: Former Smoker    Packs/day: 1.00    Years: 33.00    Pack years: 33.00    Quit date: 05/17/1987    Years since quitting: 32.3  . Smokeless tobacco: Never Used  Substance Use Topics  . Alcohol use: No  . Drug use: No     Family Hx: The patient's family history includes Aneurysm in his mother; Cancer in his brother;  Colon cancer in his maternal uncle; Heart attack in his father. There is no history of Gastric cancer or Esophageal cancer.  ROS:   Please see the history of present illness.     All other systems reviewed and are negative.   Prior CV studies:   The following studies were reviewed today:  See above  Labs/Other Tests and Data Reviewed:    EKG:  No ECG reviewed.  Recent Labs: 04/03/2019: Magnesium 1.9 07/16/2019: ALT 15; BUN 22; Hemoglobin 13.4; Platelets 252; Potassium 4.7; Sodium 132; TSH 2.166 09/26/2019: Creatinine, Ser 1.90   Recent Lipid Panel No results found for: CHOL, TRIG, HDL, CHOLHDL, LDLCALC, LDLDIRECT  Wt Readings from Last 3 Encounters:  09/26/19 198 lb (89.8 kg)  07/26/19 198 lb 4 oz (89.9 kg)  05/24/19 195 lb 6.4 oz (88.6 kg)     Objective:    Vital Signs:  BP (!) 151/69   Pulse (!) 54   Ht 5\' 10"  (1.778 m)   Wt 198 lb (89.8 kg)   BMI 28.41 kg/m    VITAL SIGNS:  reviewed  ASSESSMENT & PLAN:    1. Coronary artery disease with history of four-vessel CABG and severe SVG disease with concomitant angina pectoris:Symptomatically stable. Continue aspirin, Imdur30mg ,and metoprolol. Dr. Martinique recommended redo  CABGbut is deemed not to be an operative candidate given his metastatic adenocarcinoma of the lung. He will be medically managed. He is statin intolerant.   2. Hypertension: Blood pressure is elevated today but normal at last visit. He developed hyperkalemia with ACE inhibitors in the past.His oncologist previouslyadded amlodipine 10 mg daily as both dabrafeniband trametinib both cause hypertension. This will need continued monitoring.  3. Chronic kidney disease stage III: Creatinine 1.9 today and 1.892 months ago.  4. Stage IV metastatic adenocarcinoma of the lung: Follows with oncology.   COVID-19 Education: The signs and symptoms of COVID-19 were discussed with the patient and how to seek care for testing (follow up with PCP or arrange E-visit).  The importance of social distancing was discussed today.  Time:   Today, I have spent 15 minutes with the patient with telehealth technology discussing the above problems.     Medication Adjustments/Labs and Tests Ordered: Current medicines are reviewed at length with the patient today.  Concerns regarding medicines are outlined above.   Tests Ordered: No orders of the defined types were placed in this encounter.   Medication Changes: No orders of the defined types were placed in this encounter.   Disposition:  Follow up in 6 month(s) in office  Signed, Kate Sable, MD  09/26/2019 10:49 AM    [place]

## 2019-09-27 ENCOUNTER — Encounter (HOSPITAL_COMMUNITY): Payer: Self-pay | Admitting: Hematology

## 2019-09-27 ENCOUNTER — Inpatient Hospital Stay (HOSPITAL_COMMUNITY): Payer: Medicare Other | Attending: Hematology | Admitting: Hematology

## 2019-09-27 ENCOUNTER — Inpatient Hospital Stay (HOSPITAL_COMMUNITY): Payer: Medicare Other

## 2019-09-27 VITALS — BP 175/76 | HR 53 | Temp 97.5°F | Resp 16 | Wt 198.2 lb

## 2019-09-27 DIAGNOSIS — Z7984 Long term (current) use of oral hypoglycemic drugs: Secondary | ICD-10-CM | POA: Diagnosis not present

## 2019-09-27 DIAGNOSIS — C7800 Secondary malignant neoplasm of unspecified lung: Secondary | ICD-10-CM | POA: Diagnosis not present

## 2019-09-27 DIAGNOSIS — C3490 Malignant neoplasm of unspecified part of unspecified bronchus or lung: Secondary | ICD-10-CM

## 2019-09-27 DIAGNOSIS — I129 Hypertensive chronic kidney disease with stage 1 through stage 4 chronic kidney disease, or unspecified chronic kidney disease: Secondary | ICD-10-CM | POA: Insufficient documentation

## 2019-09-27 DIAGNOSIS — N189 Chronic kidney disease, unspecified: Secondary | ICD-10-CM | POA: Diagnosis not present

## 2019-09-27 DIAGNOSIS — Z87891 Personal history of nicotine dependence: Secondary | ICD-10-CM | POA: Diagnosis not present

## 2019-09-27 DIAGNOSIS — E1122 Type 2 diabetes mellitus with diabetic chronic kidney disease: Secondary | ICD-10-CM | POA: Diagnosis not present

## 2019-09-27 DIAGNOSIS — Z79899 Other long term (current) drug therapy: Secondary | ICD-10-CM | POA: Insufficient documentation

## 2019-09-27 LAB — CBC WITH DIFFERENTIAL/PLATELET
Abs Immature Granulocytes: 0.03 10*3/uL (ref 0.00–0.07)
Basophils Absolute: 0 10*3/uL (ref 0.0–0.1)
Basophils Relative: 1 %
Eosinophils Absolute: 0.1 10*3/uL (ref 0.0–0.5)
Eosinophils Relative: 1 %
HCT: 39.3 % (ref 39.0–52.0)
Hemoglobin: 12.8 g/dL — ABNORMAL LOW (ref 13.0–17.0)
Immature Granulocytes: 1 %
Lymphocytes Relative: 21 %
Lymphs Abs: 1.3 10*3/uL (ref 0.7–4.0)
MCH: 27.4 pg (ref 26.0–34.0)
MCHC: 32.6 g/dL (ref 30.0–36.0)
MCV: 84 fL (ref 80.0–100.0)
Monocytes Absolute: 0.7 10*3/uL (ref 0.1–1.0)
Monocytes Relative: 11 %
Neutro Abs: 4.3 10*3/uL (ref 1.7–7.7)
Neutrophils Relative %: 65 %
Platelets: 180 10*3/uL (ref 150–400)
RBC: 4.68 MIL/uL (ref 4.22–5.81)
RDW: 14.5 % (ref 11.5–15.5)
WBC: 6.4 10*3/uL (ref 4.0–10.5)
nRBC: 0 % (ref 0.0–0.2)

## 2019-09-27 LAB — COMPREHENSIVE METABOLIC PANEL
ALT: 14 U/L (ref 0–44)
AST: 18 U/L (ref 15–41)
Albumin: 3.6 g/dL (ref 3.5–5.0)
Alkaline Phosphatase: 104 U/L (ref 38–126)
Anion gap: 7 (ref 5–15)
BUN: 21 mg/dL (ref 8–23)
CO2: 26 mmol/L (ref 22–32)
Calcium: 8.9 mg/dL (ref 8.9–10.3)
Chloride: 99 mmol/L (ref 98–111)
Creatinine, Ser: 1.69 mg/dL — ABNORMAL HIGH (ref 0.61–1.24)
GFR calc Af Amer: 44 mL/min — ABNORMAL LOW (ref >60)
GFR calc non Af Amer: 38 mL/min — ABNORMAL LOW (ref >60)
Glucose, Bld: 180 mg/dL — ABNORMAL HIGH (ref 70–99)
Potassium: 5 mmol/L (ref 3.5–5.1)
Sodium: 132 mmol/L — ABNORMAL LOW (ref 135–145)
Total Bilirubin: 0.8 mg/dL (ref 0.3–1.2)
Total Protein: 7.1 g/dL (ref 6.5–8.1)

## 2019-09-27 LAB — TSH: TSH: 2.275 u[IU]/mL (ref 0.350–4.500)

## 2019-09-27 NOTE — Patient Instructions (Signed)
[facility] at [facility] Discharge Instructions  You were seen today by Dr. Katragadda. He went over your recent lab results. He will see you back in 2 months for labs and follow up.   Thank you for choosing [facility] at [facility] to provide your oncology and hematology care.  To afford each patient quality time with our provider, please arrive at least 15 minutes before your scheduled appointment time.   If you have a lab appointment with the Cancer Center please come in thru the  Main Entrance and check in at the main information desk  You need to re-schedule your appointment should you arrive 10 or more minutes late.  We strive to give you quality time with our providers, and arriving late affects you and other patients whose appointments are after yours.  Also, if you no show three or more times for appointments you may be dismissed from the clinic at the providers discretion.     Again, thank you for choosing [facility].  Our hope is that these requests will decrease the amount of time that you wait before being seen by our physicians.       _____________________________________________________________  Should you have questions after your visit to [facility], please contact our office at [phone] between the hours of 8:00 a.m. and 4:30 p.m.  Voicemails left after 4:00 p.m. will not be returned until the following business day.  For prescription refill requests, have your pharmacy contact our office and allow 72 hours.    Cancer Center Support Programs:   > Cancer Support Group  2nd Tuesday of the month 1pm-2pm, Journey Room    

## 2019-09-27 NOTE — Progress Notes (Signed)
Patient is taking taflinar and has not missed any doses and reports no side effects at this time.

## 2019-09-27 NOTE — Assessment & Plan Note (Addendum)
1.  Metastatic adenocarcinoma of the lung, BRAF V6 entity mutation positive: -Dabrafenib 100 mg twice daily and trametinib 1.5 mg once daily started on 10/07/2017, trametinib dose reduced to 1 mg subsequently. -We reviewed CT scan from 09/26/2019 which showed stable lung nodules. -I have reviewed his labs.  White count is within normal limits.  TSH is 2.2. -No change in the medication doses at this time.  He will continue same doses and come back in 2 months for follow-up.  2.  High risk drug monitoring: -Echocardiogram on 07/16/2019 with EF 60-65% with no regional wall motion abnormalities.  Grade 1 diastolic dysfunction.  3.  Hypertension: -Continue metoprolol and amlodipine.  4.  Diabetes: -Continue Metformin, Actos and Januvia.

## 2019-09-27 NOTE — Progress Notes (Signed)
[name], [place] [address]   CLINIC:  Medical Oncology/Hematology  PCP:  [name], MD [address] [place] [address] [phone]   REASON FOR VISIT:  Follow-up for NSCLC     INTERVAL HISTORY:  [name] 79 y.o. male seen for follow-up of lung cancer.  He is taking dabrafenib and trametinib without fail.  Denies any nausea, vomiting, diarrhea or constipation.  Appetite is 100%.  Energy levels are 75%.  Denies any shortness of breath on exertion.  No chest pains.  No cough or hemoptysis.  REVIEW OF SYSTEMS:  Review of Systems  All other systems reviewed and are negative.    PAST MEDICAL/SURGICAL HISTORY:  Past Medical History:  Diagnosis Date  . Cancer ([place])   . Chronic kidney disease   . Coronary artery disease   . Diabetes mellitus   . Hypertension   . 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 Spontaneous pneumothorax 09/15/2010   right   Past Surgical History:  Procedure Laterality Date  . ANKLE FRACTURE SURGERY  2008   [facility]  . APPENDECTOMY  1960's  . CHEST TUBE INSERTION Right 09/15/2010   Dr Servando Snare - spontaneous PTX  . CHOLECYSTECTOMY  2008  . COLONOSCOPY N/A 09/07/2017   Procedure: COLONOSCOPY;  Surgeon: Daneil Dolin, MD;  Location: [facility] ENDO SU[facility];  Service: Endoscopy;  Laterality: N/A;  10:30am  . CORONARY ARTERY BYPASS GRAFT  2003  . EYE SURGERY  2001   Cataracts removed bilaterally  . INCISIONAL HERNIA REPAIR N/A 01/29/2015   Procedure: Fatima Blank HERNIORRHAPHY WITH MESH;  Surgeon: Aviva Signs, MD;  Location: [facility] ORS;  Service: General;  Laterality: N/A;  . INSERTION OF MESH N/A 01/29/2015   Procedure: INSERTION OF MESH;  Surgeon: Aviva Signs, MD;  Location: [facility] ORS;  Service: General;  Laterality: N/A;  . [place]  . LEFT HEART CATH AND CORS/GRAFTS  ANGIOGRAPHY N/A 06/20/2017   Procedure: LEFT HEART CATH AND CORS/GRAFTS ANGIOGRAPHY;  Surgeon: Martinique, Peter M, MD;  Location: [place] CV LAB;  Service: Cardiovascular;  Laterality: N/A;  . ORIF ANKLE FRACTURE  08/26/2011   Procedure: OPEN REDUCTION INTERNAL FIXATION (ORIF) ANKLE FRACTURE;  Surgeon: Sanjuana Kava, MD;  Location: [facility] ORS;  Service: Orthopedics;  Laterality: Right;  . POLYPECTOMY  09/07/2017   Procedure: POLYPECTOMY;  Surgeon: Daneil Dolin, MD;  Location[facility];  Service: Endoscopy;;  ascending x2 (cold snare)  . PROSTATE SURGERY  2012   Enlarged prostate  . TRANSURETHRAL RESECTION OF PROSTATE  2012     SOCIAL HISTORY:  Social History   Socioeconomic History  . Marital status: Married    Spouse name: Not on file  . Number of children: Not on file  . Years of education: Not on file  . Highest education level: Not on file  Occupational History  . Not on file  Tobacco Use  . Smoking status: Former Smoker    Packs/day: 1.00    Years: 33.00    Pack years: 33.00    Quit date: 05/17/1987    Years since quitting: 32.3  . Smokeless tobacco: Never Used  Substance and Sexual Activity  . Alcohol use: No  . Drug use: No  . Sexual activity: Yes  Other Topics Concern  . Not  on file  Social History Narrative  . Not on file   Social Determinants of Health   Financial Resource Strain:   . Difficulty of Paying Living Expenses:   Food Insecurity:   . Worried About Charity fundraiser in the Last Year:   . Arboriculturist in the Last Year:   Transportation Needs:   . Film/video editor (Medical):   Marland Kitchen Lack of Transportation (Non-Medical):   Physical Activity:   . Days of Exercise per Week:   . Minutes of Exercise per Session:   Stress:   . Feeling of Stress :   Social Connections:   . Frequency of Communication with Friends and Family:   . Frequency of Social Gatherings with Friends and Family:   . Attends Religious Services:   . Active Member of  Clubs or Organizations:   . Attends Archivist Meetings:   Marland Kitchen Marital Status:   Intimate Partner Violence:   . Fear of Current or Ex-Partner:   . Emotionally Abused:   Marland Kitchen Physically Abused:   . Sexually Abused:     FAMILY HISTORY:  Family History  Problem Relation Age of Onset  . Aneurysm Mother   . Heart attack Father   . Colon cancer Maternal Uncle   . Cancer Brother   . Gastric cancer Neg Hx   . Esophageal cancer Neg Hx     CURRENT MEDICATIONS:  Outpatient Encounter Medications as of 09/27/2019  Medication Sig  . amLODipine (NORVASC) 10 MG tablet TAKE ONE TABLET (10MG TOTAL) BY MOUTH DAILY  . aspirin EC 81 MG tablet Take 81 mg by mouth daily.  . Cholecalciferol (VITAMIN D3) 5000 units CAPS Take 5,000 Units by mouth daily.  Marland Kitchen dabrafenib mesylate (TAFINLAR) 50 MG capsule TAKE 2 CAPSULES (100 MG TOTAL) BY MOUTH 2 (TWO) TIMES DAILY. TAKE ON AN EMPTY STOMACH 1 HOUR BEFORE OR 2 HOURS AFTER MEALS.  Marland Kitchen escitalopram (LEXAPRO) 10 MG tablet Take 10 mg by mouth daily.   Marland Kitchen glimepiride (AMARYL) 2 MG tablet Take 2 mg by mouth daily with breakfast.   . isosorbide mononitrate (IMDUR) 30 MG 24 hr tablet TAKE ONE TABLET (30MG TOTAL) BY MOUTH DAILY  . metFORMIN (GLUCOPHAGE) 500 MG tablet Take 1 tablet (500 mg total) by mouth 2 (two) times daily with a meal.  . metoprolol (LOPRESSOR) 100 MG tablet Take 100 mg by mouth 2 (two) times daily.  . nitroGLYCERIN (NITROSTAT) 0.4 MG SL tablet Place 0.4 mg under the tongue every 5 (five) minutes as needed for chest pain.   Marland Kitchen ondansetron (ZOFRAN) 8 MG tablet Take 1 tablet (8 mg total) by mouth every 8 (eight) hours as needed for nausea or vomiting.  . ONE TOUCH ULTRA TEST test strip   . pioglitazone (ACTOS) 45 MG tablet Take 45 mg by mouth daily.  . prochlorperazine (COMPAZINE) 10 MG tablet Take 1 tablet (10 mg total) by mouth every 6 (six) hours as needed for nausea or vomiting.  . sitaGLIPtin (JANUVIA) 50 MG tablet Take 50 mg by mouth daily.   .  trametinib dimethyl sulfoxide (MEKINIST) 0.5 MG tablet TAKE 2 TABLETS (1 MG TOTAL) BY MOUTH DAILY. TAKE 1 HOUR BEFORE OR 2 HOURS AFTER A MEAL. STORE REFRIGERATED IN ORIGINAL CONTAINER.   No facility-administered encounter medications on file as of 09/27/2019.    ALLERGIES:  No Known Allergies   PHYSICAL EXAM:  ECOG Performance status: 1  Vitals:   09/27/19 1055  BP: (!) 175/76  Pulse: Marland Kitchen)  53  Resp: 16  Temp: (!) 97.5 F (36.4 C)  SpO2: 100%   Filed Weights   09/27/19 1055  Weight: 198 lb 3.2 oz (89.9 kg)    Physical Exam Vitals reviewed.  Constitutional:      Appearance: Normal appearance.  Cardiovascular:     Rate and Rhythm: Normal rate and regular rhythm.     Heart sounds: Normal heart sounds.  Pulmonary:     Effort: Pulmonary effort is normal.     Breath sounds: Normal breath sounds.  Abdominal:     General: There is no distension.     Palpations: Abdomen is soft. There is no mass.  Musculoskeletal:        General: No swelling.  Skin:    General: Skin is warm.  Neurological:     General: No focal deficit present.     Mental Status: He is alert and oriented to person, place, and time.  Psychiatric:        Mood and Affect: Mood normal.        Behavior: Behavior normal.      LABORATORY DATA:  I have reviewed the labs as listed.  CBC    Component Value Date/Time   WBC 6.4 09/27/2019 1128   RBC 4.68 09/27/2019 1128   HGB 12.8 (L) 09/27/2019 1128   HCT 39.3 09/27/2019 1128   PLT 180 09/27/2019 1128   MCV 84.0 09/27/2019 1128   MCH 27.4 09/27/2019 1128   MCHC 32.6 09/27/2019 1128   RDW 14.5 09/27/2019 1128   LYMPHSABS 1.3 09/27/2019 1128   MONOABS 0.7 09/27/2019 1128   EOSABS 0.1 09/27/2019 1128   BASOSABS 0.0 09/27/2019 1128   CMP Latest Ref Rng & Units 09/27/2019 09/26/2019 07/16/2019  Glucose 70 - 99 mg/dL 180(H) - 248(H)  BUN 8 - 23 mg/dL 21 - 22  Creatinine 0.61 - 1.24 mg/dL 1.69(H) 1.90(H) 1.89(H)  Sodium 135 - 145 mmol/L 132(L) - 132(L)   Potassium 3.5 - 5.1 mmol/L 5.0 - 4.7  Chloride 98 - 111 mmol/L 99 - 97(L)  CO2 22 - 32 mmol/L 26 - 28  Calcium 8.9 - 10.3 mg/dL 8.9 - 9.0  Total Protein 6.5 - 8.1 g/dL 7.1 - 7.3  Total Bilirubin 0.3 - 1.2 mg/dL 0.8 - 0.8  Alkaline Phos 38 - 126 U/L 104 - 103  AST 15 - 41 U/L 18 - 18  ALT 0 - 44 U/L 14 - 15       DIAGNOSTIC IMAGING:  I have independently reviewed scans with the patient.   I have reviewed Venita Lick LPN's note and agree with the documentation.  I personally performed a face-to-face visit, made revisions and my assessment and plan is as follows.    ASSESSMENT & PLAN:   Adenocarcinoma of lung, stage 4 ([place]) 1.  Metastatic adenocarcinoma of the lung, BRAF V6 entity mutation positive: -Dabrafenib 100 mg twice daily and trametinib 1.5 mg once daily started on 10/07/2017, trametinib dose reduced to 1 mg subsequently. -We reviewed CT scan from 09/26/2019 which showed stable lung nodules. -I have reviewed his labs.  White count is within normal limits.  TSH is 2.2. -No change in the medication doses at this time.  He will continue same doses and come back in 2 months for follow-up.  2.  High risk drug monitoring: -Echocardiogram on 07/16/2019 with EF 60-65% with no regional wall motion abnormalities.  Grade 1 diastolic dysfunction.  3.  Hypertension: -Continue metoprolol and amlodipine.  4.  Diabetes: -  Continue Metformin, Actos and Januvia.     Orders placed this encounter:  Orders Placed This Encounter  Procedures  . CBC with Differential  . Comprehensive metabolic panel  . TSH      Derek Jack, [place] [phone]

## 2019-10-03 ENCOUNTER — Other Ambulatory Visit (HOSPITAL_COMMUNITY): Payer: Self-pay | Admitting: *Deleted

## 2019-10-03 DIAGNOSIS — C3490 Malignant neoplasm of unspecified part of unspecified bronchus or lung: Secondary | ICD-10-CM

## 2019-10-03 MED ORDER — DABRAFENIB MESYLATE 50 MG PO CAPS: ORAL_CAPSULE | ORAL | 3 refills | Status: DC

## 2019-10-03 MED ORDER — TRAMETINIB DIMETHYL SULFOXIDE 0.5 MG PO TABS: ORAL_TABLET | ORAL | 3 refills | Status: DC

## 2019-10-03 NOTE — Telephone Encounter (Signed)
Per last office note, refilled taflinar and mekinist.

## 2019-10-25 DIAGNOSIS — H52202 Unspecified astigmatism, left eye: Secondary | ICD-10-CM | POA: Diagnosis not present

## 2019-10-25 DIAGNOSIS — E119 Type 2 diabetes mellitus without complications: Secondary | ICD-10-CM | POA: Diagnosis not present

## 2019-10-25 DIAGNOSIS — Z794 Long term (current) use of insulin: Secondary | ICD-10-CM | POA: Diagnosis not present

## 2019-10-25 DIAGNOSIS — H5213 Myopia, bilateral: Secondary | ICD-10-CM | POA: Diagnosis not present

## 2019-10-25 DIAGNOSIS — Z961 Presence of intraocular lens: Secondary | ICD-10-CM | POA: Diagnosis not present

## 2019-11-05 DIAGNOSIS — E1129 Type 2 diabetes mellitus with other diabetic kidney complication: Secondary | ICD-10-CM | POA: Diagnosis not present

## 2019-11-05 DIAGNOSIS — N183 Chronic kidney disease, stage 3 unspecified: Secondary | ICD-10-CM | POA: Diagnosis not present

## 2019-11-05 DIAGNOSIS — C3490 Malignant neoplasm of unspecified part of unspecified bronchus or lung: Secondary | ICD-10-CM | POA: Diagnosis not present

## 2019-11-05 DIAGNOSIS — Z79899 Other long term (current) drug therapy: Secondary | ICD-10-CM | POA: Diagnosis not present

## 2019-11-05 DIAGNOSIS — I2510 Atherosclerotic heart disease of native coronary artery without angina pectoris: Secondary | ICD-10-CM | POA: Diagnosis not present

## 2019-11-12 DIAGNOSIS — E1122 Type 2 diabetes mellitus with diabetic chronic kidney disease: Secondary | ICD-10-CM | POA: Diagnosis not present

## 2019-11-12 DIAGNOSIS — N1832 Chronic kidney disease, stage 3b: Secondary | ICD-10-CM | POA: Diagnosis not present

## 2019-11-12 DIAGNOSIS — E785 Hyperlipidemia, unspecified: Secondary | ICD-10-CM | POA: Diagnosis not present

## 2019-11-27 ENCOUNTER — Other Ambulatory Visit: Payer: Self-pay

## 2019-11-27 ENCOUNTER — Inpatient Hospital Stay (HOSPITAL_COMMUNITY): Payer: Medicare Other | Attending: Hematology

## 2019-11-27 DIAGNOSIS — N189 Chronic kidney disease, unspecified: Secondary | ICD-10-CM | POA: Insufficient documentation

## 2019-11-27 DIAGNOSIS — C3490 Malignant neoplasm of unspecified part of unspecified bronchus or lung: Secondary | ICD-10-CM | POA: Diagnosis not present

## 2019-11-27 DIAGNOSIS — E1122 Type 2 diabetes mellitus with diabetic chronic kidney disease: Secondary | ICD-10-CM | POA: Diagnosis not present

## 2019-11-27 DIAGNOSIS — I129 Hypertensive chronic kidney disease with stage 1 through stage 4 chronic kidney disease, or unspecified chronic kidney disease: Secondary | ICD-10-CM | POA: Diagnosis not present

## 2019-11-27 LAB — CBC WITH DIFFERENTIAL/PLATELET
Abs Immature Granulocytes: 0.02 10*3/uL (ref 0.00–0.07)
Basophils Absolute: 0 10*3/uL (ref 0.0–0.1)
Basophils Relative: 1 %
Eosinophils Absolute: 0 10*3/uL (ref 0.0–0.5)
Eosinophils Relative: 1 %
HCT: 38.2 % — ABNORMAL LOW (ref 39.0–52.0)
Hemoglobin: 12.7 g/dL — ABNORMAL LOW (ref 13.0–17.0)
Immature Granulocytes: 0 %
Lymphocytes Relative: 30 %
Lymphs Abs: 1.9 10*3/uL (ref 0.7–4.0)
MCH: 27.5 pg (ref 26.0–34.0)
MCHC: 33.2 g/dL (ref 30.0–36.0)
MCV: 82.9 fL (ref 80.0–100.0)
Monocytes Absolute: 0.6 10*3/uL (ref 0.1–1.0)
Monocytes Relative: 10 %
Neutro Abs: 3.7 10*3/uL (ref 1.7–7.7)
Neutrophils Relative %: 58 %
Platelets: 169 10*3/uL (ref 150–400)
RBC: 4.61 MIL/uL (ref 4.22–5.81)
RDW: 14.7 % (ref 11.5–15.5)
WBC: 6.4 10*3/uL (ref 4.0–10.5)
nRBC: 0 % (ref 0.0–0.2)

## 2019-11-27 LAB — COMPREHENSIVE METABOLIC PANEL
ALT: 17 U/L (ref 0–44)
AST: 26 U/L (ref 15–41)
Albumin: 3.5 g/dL (ref 3.5–5.0)
Alkaline Phosphatase: 126 U/L (ref 38–126)
Anion gap: 11 (ref 5–15)
BUN: 22 mg/dL (ref 8–23)
CO2: 22 mmol/L (ref 22–32)
Calcium: 8.6 mg/dL — ABNORMAL LOW (ref 8.9–10.3)
Chloride: 99 mmol/L (ref 98–111)
Creatinine, Ser: 1.71 mg/dL — ABNORMAL HIGH (ref 0.61–1.24)
GFR calc Af Amer: 43 mL/min — ABNORMAL LOW (ref >60)
GFR calc non Af Amer: 37 mL/min — ABNORMAL LOW (ref >60)
Glucose, Bld: 206 mg/dL — ABNORMAL HIGH (ref 70–99)
Potassium: 4.4 mmol/L (ref 3.5–5.1)
Sodium: 132 mmol/L — ABNORMAL LOW (ref 135–145)
Total Bilirubin: 0.5 mg/dL (ref 0.3–1.2)
Total Protein: 7 g/dL (ref 6.5–8.1)

## 2019-12-04 ENCOUNTER — Other Ambulatory Visit: Payer: Self-pay

## 2019-12-04 ENCOUNTER — Inpatient Hospital Stay (HOSPITAL_BASED_OUTPATIENT_CLINIC_OR_DEPARTMENT_OTHER): Payer: Medicare Other | Admitting: Hematology

## 2019-12-04 VITALS — BP 137/70 | HR 60 | Temp 96.8°F | Resp 18 | Wt 199.3 lb

## 2019-12-04 DIAGNOSIS — I129 Hypertensive chronic kidney disease with stage 1 through stage 4 chronic kidney disease, or unspecified chronic kidney disease: Secondary | ICD-10-CM | POA: Diagnosis not present

## 2019-12-04 DIAGNOSIS — C3490 Malignant neoplasm of unspecified part of unspecified bronchus or lung: Secondary | ICD-10-CM

## 2019-12-04 DIAGNOSIS — N189 Chronic kidney disease, unspecified: Secondary | ICD-10-CM | POA: Diagnosis not present

## 2019-12-04 DIAGNOSIS — E1122 Type 2 diabetes mellitus with diabetic chronic kidney disease: Secondary | ICD-10-CM | POA: Diagnosis not present

## 2019-12-04 NOTE — Patient Instructions (Signed)
[name] at [facility] Discharge Instructions  You were seen today by Dr. Delton Coombes. He went over your recent results. You will be scheduled for a CT scan of your chest and an echocardiogram of your heart. Dr. Delton Coombes will see you back in 3 months for labs and follow up.   Thank you for choosing [place] at [facility] to provide your oncology and hematology care.  To afford each patient quality time with our provider, please arrive at least 15 minutes before your scheduled appointment time.   If you have a lab appointment with the [facility] please come in thru the Main Entrance and check in at the main information desk  You need to re-schedule your appointment should you arrive 10 or more minutes late.  We strive to give you quality time with our providers, and arriving late affects you and other patients whose appointments are after yours.  Also, if you no show three or more times for appointments you may be dismissed from the clinic at the providers discretion.     Again, thank you for choosing [facility].  Our hope is that these requests will decrease the amount of time that you wait before being seen by our physicians.       _____________________________________________________________  Should you have questions after your visit to [facility], please contact our office at [phone] between the hours of 8:00 a.m. and 4:30 p.m.  Voicemails left after 4:00 p.m. will not be returned until the following business day.  For prescription refill requests, have your pharmacy contact our office and allow 72 hours.    Cancer Center Support Programs:   > Cancer Support Group  2nd Tuesday of the month 1pm-2pm, Journey Room

## 2019-12-04 NOTE — Progress Notes (Signed)
[place], [place] [address]   CLINIC:  Medical Oncology/Hematology  PCP:  [name], MD [address] / [place] [address] [phone]   REASON FOR VISIT:  Follow-up for metastatic NSCLC  PRIOR THERAPY: None  NGS Results: Not done  CURRENT THERAPY: Dabrafenib & trametinib  BRIEF ONCOLOGIC HISTORY:  Oncology History   No history exists.    CANCER STAGING: Cancer Staging No matching staging information was found for the patient.  INTERVAL HISTORY:  [name], a 79 y.o. male, returns for routine follow-up of his metastatic NSCLC. Chord was last seen on 09/27/2019.  Today he reports feeling well overall. He is tolerating the dabrafenib and trametinib well. He is wearing a back brace. His appetite is good and he is eating well.   REVIEW OF SYSTEMS:  Review of Systems  Constitutional: Positive for fatigue (moderate). Negative for appetite change.  Musculoskeletal: Positive for back pain.  All other systems reviewed and are negative.   PAST MEDICAL/SURGICAL HISTORY:  Past Medical History:  Diagnosis Date  . Cancer ([place])   . Chronic kidney disease   . Coronary artery disease   . Diabetes mellitus   . Hypertension   . 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 Spontaneous pneumothorax 09/15/2010   right   Past Surgical History:  Procedure Laterality Date  . ANKLE FRACTURE SURGERY  2008   [facility]  . APPENDECTOMY  1960's  . CHEST TUBE INSERTION Right 09/15/2010   Dr Servando Snare - spontaneous PTX  . CHOLECYSTECTOMY  2008  . COLONOSCOPY N/A 09/07/2017   Procedure: COLONOSCOPY;  Surgeon: Daneil Dolin, MD;  [facility];  Service: Endoscopy;  Laterality: N/A;  10:30am  . CORONARY ARTERY BYPASS GRAFT  2003  . EYE SURGERY  2001   Cataracts removed bilaterally  . INCISIONAL HERNIA REPAIR N/A  01/29/2015   Procedure: Fatima Blank HERNIORRHAPHY WITH MESH;  Surgeon: Aviva Signs, MD;  Location: [facility] ORS;  Service: General;  Laterality: N/A;  . INSERTION OF MESH N/A 01/29/2015   Procedure: INSERTION OF MESH;  Surgeon: Aviva Signs, MD;  Location: [facility] ORS;  Service: General;  Laterality: N/A;  . [place]  . LEFT HEART CATH AND CORS/GRAFTS ANGIOGRAPHY N/A 06/20/2017   Procedure: LEFT HEART CATH AND CORS/GRAFTS ANGIOGRAPHY;  Surgeon: Martinique, Peter M, MD;  Location: [place] CV LAB;  Service: Cardiovascular;  Laterality: N/A;  . ORIF ANKLE FRACTURE  08/26/2011   Procedure: OPEN REDUCTION INTERNAL FIXATION (ORIF) ANKLE FRACTURE;  Surgeon: Sanjuana Kava, MD;  Location: [facility] ORS;  Service: Orthopedics;  Laterality: Right;  . POLYPECTOMY  09/07/2017   Procedure: POLYPECTOMY;  Surgeon: Daneil Dolin, MD;  Location[facility];  Service: Endoscopy;;  ascending x2 (cold snare)  . PROSTATE SURGERY  2012   Enlarged prostate  . TRANSURETHRAL RESECTION OF PROSTATE  2012    SOCIAL HISTORY:  Social History   Socioeconomic History  . Marital status: Married    Spouse name: Not on file  . Number of children: Not on file  . Years of education: Not on file  . Highest education level: Not on file  Occupational History  . Not on file  Tobacco Use  . Smoking status: Former Smoker    Packs/day: 1.00  Years: 33.00    Pack years: 33.00    Quit date: 05/17/1987    Years since quitting: 32.5  . Smokeless tobacco: Never Used  Vaping Use  . Vaping Use: Never used  Substance and Sexual Activity  . Alcohol use: No  . Drug use: No  . Sexual activity: Yes  Other Topics Concern  . Not on file  Social History Narrative  . Not on file   Social Determinants of Health   Financial Resource Strain:   . Difficulty of Paying Living Expenses:   Food Insecurity:   . Worried About Charity fundraiser in the Last Year:   . Arboriculturist in the Last Year:     Transportation Needs:   . Film/video editor (Medical):   Marland Kitchen Lack of Transportation (Non-Medical):   Physical Activity:   . Days of Exercise per Week:   . Minutes of Exercise per Session:   Stress:   . Feeling of Stress :   Social Connections:   . Frequency of Communication with Friends and Family:   . Frequency of Social Gatherings with Friends and Family:   . Attends Religious Services:   . Active Member of Clubs or Organizations:   . Attends Archivist Meetings:   Marland Kitchen Marital Status:   Intimate Partner Violence:   . Fear of Current or Ex-Partner:   . Emotionally Abused:   Marland Kitchen Physically Abused:   . Sexually Abused:     FAMILY HISTORY:  Family History  Problem Relation Age of Onset  . Aneurysm Mother   . Heart attack Father   . Colon cancer Maternal Uncle   . Cancer Brother   . Gastric cancer Neg Hx   . Esophageal cancer Neg Hx     CURRENT MEDICATIONS:  Current Outpatient Medications  Medication Sig Dispense Refill  . amLODipine (NORVASC) 10 MG tablet TAKE ONE TABLET (10MG TOTAL) BY MOUTH DAILY 90 tablet 3  . aspirin EC 81 MG tablet Take 81 mg by mouth daily.    . Cholecalciferol (VITAMIN D3) 5000 units CAPS Take 5,000 Units by mouth daily.    Marland Kitchen dabrafenib mesylate (TAFINLAR) 50 MG capsule TAKE 2 CAPSULES (100 MG TOTAL) BY MOUTH 2 (TWO) TIMES DAILY. TAKE ON AN EMPTY STOMACH 1 HOUR BEFORE OR 2 HOURS AFTER MEALS. 120 capsule 3  . escitalopram (LEXAPRO) 10 MG tablet Take 10 mg by mouth daily.     Marland Kitchen glimepiride (AMARYL) 2 MG tablet Take 2 mg by mouth daily with breakfast.     . isosorbide mononitrate (IMDUR) 30 MG 24 hr tablet TAKE ONE TABLET (30MG TOTAL) BY MOUTH DAILY 90 tablet 2  . metFORMIN (GLUCOPHAGE) 500 MG tablet Take 1 tablet (500 mg total) by mouth 2 (two) times daily with a meal.    . metoprolol (LOPRESSOR) 100 MG tablet Take 100 mg by mouth 2 (two) times daily.    . nitroGLYCERIN (NITROSTAT) 0.4 MG SL tablet Place 0.4 mg under the tongue every 5  (five) minutes as needed for chest pain.     Marland Kitchen ondansetron (ZOFRAN) 8 MG tablet Take 1 tablet (8 mg total) by mouth every 8 (eight) hours as needed for nausea or vomiting. 30 tablet 2  . ONE TOUCH ULTRA TEST test strip     . pioglitazone (ACTOS) 45 MG tablet Take 45 mg by mouth daily.    . prochlorperazine (COMPAZINE) 10 MG tablet Take 1 tablet (10 mg total) by mouth every 6 (six) hours  as needed for nausea or vomiting. 30 tablet 2  . sitaGLIPtin (JANUVIA) 50 MG tablet Take 50 mg by mouth daily.     . trametinib dimethyl sulfoxide (MEKINIST) 0.5 MG tablet TAKE 2 TABLETS (1 MG TOTAL) BY MOUTH DAILY. TAKE 1 HOUR BEFORE OR 2 HOURS AFTER A MEAL. STORE REFRIGERATED IN ORIGINAL CONTAINER. 60 tablet 3   No current facility-administered medications for this visit.    ALLERGIES:  No Known Allergies  PHYSICAL EXAM:  Performance status (ECOG): 1 - Symptomatic but completely ambulatory  There were no vitals filed for this visit. Wt Readings from Last 3 Encounters:  09/27/19 198 lb 3.2 oz (89.9 kg)  09/26/19 198 lb (89.8 kg)  07/26/19 198 lb 4 oz (89.9 kg)   Physical Exam Vitals reviewed.  Constitutional:      Appearance: Normal appearance.  Cardiovascular:     Rate and Rhythm: Normal rate and regular rhythm.     Pulses: Normal pulses.     Heart sounds: Normal heart sounds.  Pulmonary:     Effort: Pulmonary effort is normal.     Breath sounds: Normal breath sounds.  Abdominal:     Palpations: Abdomen is soft. There is no hepatomegaly, splenomegaly or mass.     Tenderness: There is no abdominal tenderness.  Musculoskeletal:     Right lower leg: No edema.     Left lower leg: No edema.  Neurological:     General: No focal deficit present.     Mental Status: He is alert and oriented to person, place, and time.  Psychiatric:        Mood and Affect: Mood normal.        Behavior: Behavior normal.      LABORATORY DATA:  I have reviewed the labs as listed.  CBC Latest Ref Rng & Units  11/27/2019 09/27/2019 07/16/2019  WBC 4.0 - 10.5 K/uL 6.4 6.4 7.2  Hemoglobin 13.0 - 17.0 g/dL 12.7(L) 12.8(L) 13.4  Hematocrit 39 - 52 % 38.2(L) 39.3 40.5  Platelets 150 - 400 K/uL 169 180 252   CMP Latest Ref Rng & Units 11/27/2019 09/27/2019 09/26/2019  Glucose 70 - 99 mg/dL 206(H) 180(H) -  BUN 8 - 23 mg/dL 22 21 -  Creatinine 0.61 - 1.24 mg/dL 1.71(H) 1.69(H) 1.90(H)  Sodium 135 - 145 mmol/L 132(L) 132(L) -  Potassium 3.5 - 5.1 mmol/L 4.4 5.0 -  Chloride 98 - 111 mmol/L 99 99 -  CO2 22 - 32 mmol/L 22 26 -  Calcium 8.9 - 10.3 mg/dL 8.6(L) 8.9 -  Total Protein 6.5 - 8.1 g/dL 7.0 7.1 -  Total Bilirubin 0.3 - 1.2 mg/dL 0.5 0.8 -  Alkaline Phos 38 - 126 U/L 126 104 -  AST 15 - 41 U/L 26 18 -  ALT 0 - 44 U/L 17 14 -    DIAGNOSTIC IMAGING:  I have independently reviewed the scans and discussed with the patient.   ASSESSMENT:  1.  Metastatic adenocarcinoma of the lung, BRAF V6 entity mutation positive: -Dabrafenib 100 mg twice daily and trametinib 1.5 mg once daily started on 10/07/2017, trametinib dose reduced to 1 mg subsequently. -Last CT of the chest on 09/26/2019 showed stable lung nodules.  2.  High risk drug monitoring: -Echocardiogram on 07/16/2019 shows EF 60-65% with grade 1 diastolic dysfunction.   PLAN:  1.  Metastatic adenocarcinoma of the lung, BRAF V6 entity mutation positive: -Continue dabrafenib and trametinib at this time.  Reviewed his labs today which showed normal LFTs  and electrolytes.  CBC was normal. -I will see him back in 3 months for follow-up with repeat labs. -I plan to repeat CT scan prior to next visit.  2.  High risk drug monitoring: -He does not have any clinical signs or symptoms of congestive heart failure.  I plan to repeat echocardiogram prior to next visit in 3 months.  3.  Hypertension: -His blood pressure today is 137/60.  Continue metoprolol and amlodipine.  4.  Diabetes: -Continue Metformin, Actos and Januvia.    Orders placed this  encounter:  No orders of the defined types were placed in this encounter.    Derek Jack, MD [place] [phone]   I, Milinda Antis, am acting as a scribe for Dr. Sanda Linger.  I, Derek Jack MD, have reviewed the above documentation for accuracy and completeness, and I agree with the above.

## 2020-01-09 ENCOUNTER — Other Ambulatory Visit: Payer: Self-pay | Admitting: *Deleted

## 2020-01-09 MED ORDER — ISOSORBIDE MONONITRATE ER 30 MG PO TB24: ORAL_TABLET | ORAL | 2 refills | Status: DC

## 2020-01-16 ENCOUNTER — Other Ambulatory Visit (HOSPITAL_COMMUNITY): Payer: Self-pay | Admitting: *Deleted

## 2020-01-16 DIAGNOSIS — C3490 Malignant neoplasm of unspecified part of unspecified bronchus or lung: Secondary | ICD-10-CM

## 2020-01-16 MED ORDER — TRAMETINIB DIMETHYL SULFOXIDE 0.5 MG PO TABS: ORAL_TABLET | ORAL | 3 refills | Status: DC

## 2020-01-16 MED ORDER — DABRAFENIB MESYLATE 50 MG PO CAPS: ORAL_CAPSULE | ORAL | 3 refills | Status: DC

## 2020-01-16 NOTE — Telephone Encounter (Signed)
Per last office note, okay to refill Taflinar and Mekinst

## 2020-01-29 ENCOUNTER — Other Ambulatory Visit (HOSPITAL_COMMUNITY): Payer: Self-pay

## 2020-01-29 DIAGNOSIS — C3490 Malignant neoplasm of unspecified part of unspecified bronchus or lung: Secondary | ICD-10-CM

## 2020-01-29 MED ORDER — TRAMETINIB DIMETHYL SULFOXIDE 0.5 MG PO TABS: ORAL_TABLET | ORAL | 3 refills | Status: DC

## 2020-01-29 MED ORDER — DABRAFENIB MESYLATE 50 MG PO CAPS: ORAL_CAPSULE | ORAL | 3 refills | Status: DC

## 2020-03-05 ENCOUNTER — Inpatient Hospital Stay (HOSPITAL_COMMUNITY): Payer: Medicare Other | Attending: Hematology

## 2020-03-05 ENCOUNTER — Other Ambulatory Visit: Payer: Self-pay

## 2020-03-05 ENCOUNTER — Ambulatory Visit (HOSPITAL_COMMUNITY)
Admission: RE | Admit: 2020-03-05 | Discharge: 2020-03-05 | Disposition: A | Discharge status: Home / Self Care | Payer: Medicare Other | Source: Ambulatory Visit | Attending: Hematology | Admitting: Hematology

## 2020-03-05 DIAGNOSIS — I10 Essential (primary) hypertension: Secondary | ICD-10-CM | POA: Diagnosis not present

## 2020-03-05 DIAGNOSIS — Z79899 Other long term (current) drug therapy: Secondary | ICD-10-CM | POA: Diagnosis not present

## 2020-03-05 DIAGNOSIS — C3490 Malignant neoplasm of unspecified part of unspecified bronchus or lung: Secondary | ICD-10-CM | POA: Insufficient documentation

## 2020-03-05 DIAGNOSIS — I2510 Atherosclerotic heart disease of native coronary artery without angina pectoris: Secondary | ICD-10-CM | POA: Diagnosis not present

## 2020-03-05 DIAGNOSIS — Z0189 Encounter for other specified special examinations: Secondary | ICD-10-CM

## 2020-03-05 DIAGNOSIS — Z951 Presence of aortocoronary bypass graft: Secondary | ICD-10-CM | POA: Insufficient documentation

## 2020-03-05 DIAGNOSIS — Z23 Encounter for immunization: Secondary | ICD-10-CM | POA: Diagnosis not present

## 2020-03-05 DIAGNOSIS — E119 Type 2 diabetes mellitus without complications: Secondary | ICD-10-CM | POA: Diagnosis not present

## 2020-03-05 LAB — CBC WITH DIFFERENTIAL/PLATELET
Abs Immature Granulocytes: 0.01 10*3/uL (ref 0.00–0.07)
Basophils Absolute: 0 10*3/uL (ref 0.0–0.1)
Basophils Relative: 0 %
Eosinophils Absolute: 0 10*3/uL (ref 0.0–0.5)
Eosinophils Relative: 1 %
HCT: 36.5 % — ABNORMAL LOW (ref 39.0–52.0)
Hemoglobin: 12.3 g/dL — ABNORMAL LOW (ref 13.0–17.0)
Immature Granulocytes: 0 %
Lymphocytes Relative: 25 %
Lymphs Abs: 1.1 10*3/uL (ref 0.7–4.0)
MCH: 27.4 pg (ref 26.0–34.0)
MCHC: 33.7 g/dL (ref 30.0–36.0)
MCV: 81.3 fL (ref 80.0–100.0)
Monocytes Absolute: 0.6 10*3/uL (ref 0.1–1.0)
Monocytes Relative: 13 %
Neutro Abs: 2.6 10*3/uL (ref 1.7–7.7)
Neutrophils Relative %: 61 %
Platelets: 182 10*3/uL (ref 150–400)
RBC: 4.49 MIL/uL (ref 4.22–5.81)
RDW: 13.8 % (ref 11.5–15.5)
WBC: 4.2 10*3/uL (ref 4.0–10.5)
nRBC: 0 % (ref 0.0–0.2)

## 2020-03-05 LAB — COMPREHENSIVE METABOLIC PANEL
ALT: 15 U/L (ref 0–44)
AST: 20 U/L (ref 15–41)
Albumin: 3.3 g/dL — ABNORMAL LOW (ref 3.5–5.0)
Alkaline Phosphatase: 92 U/L (ref 38–126)
Anion gap: 10 (ref 5–15)
BUN: 16 mg/dL (ref 8–23)
CO2: 29 mmol/L (ref 22–32)
Calcium: 9.1 mg/dL (ref 8.9–10.3)
Chloride: 94 mmol/L — ABNORMAL LOW (ref 98–111)
Creatinine, Ser: 1.47 mg/dL — ABNORMAL HIGH (ref 0.61–1.24)
GFR, Estimated: 44 mL/min — ABNORMAL LOW (ref >60)
Glucose, Bld: 201 mg/dL — ABNORMAL HIGH (ref 70–99)
Potassium: 4 mmol/L (ref 3.5–5.1)
Sodium: 133 mmol/L — ABNORMAL LOW (ref 135–145)
Total Bilirubin: 0.9 mg/dL (ref 0.3–1.2)
Total Protein: 6.9 g/dL (ref 6.5–8.1)

## 2020-03-05 LAB — TSH: TSH: 1.909 u[IU]/mL (ref 0.350–4.500)

## 2020-03-05 LAB — ECHOCARDIOGRAM COMPLETE
Area-P 1/2: 2.62 cm2
S' Lateral: 2.33 cm

## 2020-03-05 NOTE — Progress Notes (Signed)
*  PRELIMINARY RESULTS* Echocardiogram 2D Echocardiogram has been performed.  [name] 03/05/2020, 9:14 AM

## 2020-03-06 DIAGNOSIS — Z79899 Other long term (current) drug therapy: Secondary | ICD-10-CM | POA: Diagnosis not present

## 2020-03-06 DIAGNOSIS — I2510 Atherosclerotic heart disease of native coronary artery without angina pectoris: Secondary | ICD-10-CM | POA: Diagnosis not present

## 2020-03-06 DIAGNOSIS — N183 Chronic kidney disease, stage 3 unspecified: Secondary | ICD-10-CM | POA: Diagnosis not present

## 2020-03-06 DIAGNOSIS — E1129 Type 2 diabetes mellitus with other diabetic kidney complication: Secondary | ICD-10-CM | POA: Diagnosis not present

## 2020-03-07 ENCOUNTER — Ambulatory Visit (HOSPITAL_COMMUNITY): Admission: RE | Admit: 2020-03-07 | Payer: Medicare Other | Source: Ambulatory Visit

## 2020-03-11 ENCOUNTER — Other Ambulatory Visit: Payer: Self-pay

## 2020-03-11 ENCOUNTER — Ambulatory Visit (HOSPITAL_COMMUNITY)
Admission: RE | Admit: 2020-03-11 | Discharge: 2020-03-11 | Disposition: A | Discharge status: Home / Self Care | Payer: Medicare Other | Source: Ambulatory Visit | Attending: Hematology | Admitting: Hematology

## 2020-03-11 DIAGNOSIS — C3490 Malignant neoplasm of unspecified part of unspecified bronchus or lung: Secondary | ICD-10-CM | POA: Diagnosis not present

## 2020-03-11 DIAGNOSIS — I2510 Atherosclerotic heart disease of native coronary artery without angina pectoris: Secondary | ICD-10-CM | POA: Diagnosis not present

## 2020-03-11 DIAGNOSIS — M47814 Spondylosis without myelopathy or radiculopathy, thoracic region: Secondary | ICD-10-CM | POA: Diagnosis not present

## 2020-03-11 DIAGNOSIS — J439 Emphysema, unspecified: Secondary | ICD-10-CM | POA: Diagnosis not present

## 2020-03-11 MED ORDER — IOHEXOL 300 MG/ML  SOLN
75.0000 mL | Freq: Once | INTRAMUSCULAR | Status: AC | PRN
  Administered 2020-03-11: 60 mL via INTRAVENOUS

## 2020-03-12 ENCOUNTER — Inpatient Hospital Stay (HOSPITAL_COMMUNITY): Payer: Medicare Other | Admitting: Hematology

## 2020-03-12 VITALS — BP 138/62 | HR 62 | Temp 96.9°F | Resp 18 | Wt 194.7 lb

## 2020-03-12 DIAGNOSIS — C3490 Malignant neoplasm of unspecified part of unspecified bronchus or lung: Secondary | ICD-10-CM | POA: Diagnosis not present

## 2020-03-12 DIAGNOSIS — Z23 Encounter for immunization: Secondary | ICD-10-CM | POA: Diagnosis not present

## 2020-03-12 DIAGNOSIS — Z79899 Other long term (current) drug therapy: Secondary | ICD-10-CM | POA: Diagnosis not present

## 2020-03-12 MED ORDER — INFLUENZA VAC A&B SA ADJ QUAD 0.5 ML IM PRSY
0.5000 mL | PREFILLED_SYRINGE | Freq: Once | INTRAMUSCULAR | Status: AC
  Administered 2020-03-12: 0.5 mL via INTRAMUSCULAR
  Filled 2020-03-12: qty 0.5

## 2020-03-12 NOTE — Patient Instructions (Signed)
[place] at [facility] Discharge Instructions  You were seen today by Dr. Delton Coombes. He went over your recent results. You received your flu shot today. Eat a protein-dense diet to improve your blood albumin level. Dr. Delton Coombes will see you back in 3 months for labs and follow up.   Thank you for choosing [place] at [facility] to provide your oncology and hematology care.  To afford each patient quality time with our provider, please arrive at least 15 minutes before your scheduled appointment time.   If you have a lab appointment with the [place] please come in thru the Main Entrance and check in at the main information desk  You need to re-schedule your appointment should you arrive 10 or more minutes late.  We strive to give you quality time with our providers, and arriving late affects you and other patients whose appointments are after yours.  Also, if you no show three or more times for appointments you may be dismissed from the clinic at the providers discretion.     Again, thank you for choosing [facility].  Our hope is that these requests will decrease the amount of time that you wait before being seen by our physicians.       _____________________________________________________________  Should you have questions after your visit to [facility], please contact our office at [phone] between the hours of 8:00 a.m. and 4:30 p.m.  Voicemails left after 4:00 p.m. will not be returned until the following business day.  For prescription refill requests, have your pharmacy contact our office and allow 72 hours.    Cancer Center Support Programs:   > Cancer Support Group  2nd Tuesday of the month 1pm-2pm, Journey Room

## 2020-03-12 NOTE — Progress Notes (Signed)
[name], [place] [address]   CLINIC:  Medical Oncology/Hematology  PCP:  [name], MD [address] / [place] [address] [phone]   REASON FOR VISIT:  Follow-up for metastatic lung cancer  PRIOR THERAPY: None  NGS Results: Not done  CURRENT THERAPY: Dabrafenib & trametinib daily  BRIEF ONCOLOGIC HISTORY:  Oncology History   No history exists.    CANCER STAGING: Cancer Staging No matching staging information was found for the patient.  INTERVAL HISTORY:  [name], a 79 y.o. male, returns for routine follow-up of his metastatic lung cancer. [name] was last seen on 12/04/2019.   Today [name] reports feeling "normal." [name] continues being active, though his stamina is not as good. [name] is taking dabrafenib and trametinib on an empty stomach in the morning and tolerating it well; [name] denies having N/V, changes in BM, CP, SOB or leg swelling. His appetite is fair.   REVIEW OF SYSTEMS:  Review of Systems  Constitutional: Positive for appetite change (50%) and fatigue (50%).  Respiratory: Negative for shortness of breath.   Cardiovascular: Negative for chest pain and leg swelling.  Gastrointestinal: Negative for nausea and vomiting.  All other systems reviewed and are negative.   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   LIMA to LAD, SVG to D1, SVG to OM2, SVG to RCA, open vein harvest right thigh and lower leg   Spontaneous pneumothorax 09/15/2010   right   Past Surgical History:  Procedure Laterality Date   ANKLE FRACTURE SURGERY  2008   [facility]   APPENDECTOMY  [phone]   CHEST TUBE INSERTION Right 09/15/2010   Dr Servando Snare - spontaneous PTX   CHOLECYSTECTOMY  2008   COLONOSCOPY N/A 09/07/2017   Procedure:  COLONOSCOPY;  Surgeon: Daneil Dolin, MD;  Location: [facility] ENDO [facility]ITE;  Service: Endoscopy;  Laterality: N/A;  10:30am   CORONARY ARTERY BYPASS GRAFT  2003   EYE SURGERY  2001   Cataracts removed bilaterally   INCISIONAL HERNIA REPAIR N/A 01/29/2015   Procedure: Fatima Blank HERNIORRHAPHY WITH MESH;  Surgeon: Aviva Signs, MD;  Location: [facility] ORS;  Service: General;  Laterality: N/A;   INSERTION OF MESH N/A 01/29/2015   Procedure: INSERTION OF MESH;  Surgeon: Aviva Signs, MD;  Location: [facility] ORS;  Service: General;  Laterality: N/A;   [place] CATH AND CORS/GRAFTS ANGIOGRAPHY N/A 06/20/2017   Procedure: LEFT HEART CATH AND CORS/GRAFTS ANGIOGRAPHY;  Surgeon: Martinique, Peter M, MD;  Location: [place] CV LAB;  Service: Cardiovascular;  Laterality: N/A;   ORIF ANKLE FRACTURE  08/26/2011   Procedure: OPEN REDUCTION INTERNAL FIXATION (ORIF) ANKLE FRACTURE;  Surgeon: Sanjuana Kava, MD;  Location: [facility] ORS;  Service: Orthopedics;  Laterality: Right;   POLYPECTOMY  09/07/2017   Procedure: POLYPECTOMY;  Surgeon: Daneil Dolin, MD;  Location: [facility];  Service: Endoscopy;;  ascending x2 (cold snare)   PROSTATE SURGERY  2012   Enlarged prostate   TRANSURETHRAL RESECTION OF PROSTATE  2012    SOCIAL HISTORY:  Social History   Socioeconomic History   Marital status: Married    Spouse name: Not on file   Number of children: Not  on file   Years of education: Not on file   Highest education level: Not on file  Occupational History   Not on file  Tobacco Use   Smoking status: Former Smoker    Packs/day: 1.00    Years: 33.00    Pack years: 33.00    Quit date: 05/17/1987    Years since quitting: 32.8   Smokeless tobacco: Never Used  Vaping Use   Vaping Use: Never used  Substance and Sexual Activity   Alcohol use: No   Drug use: No   Sexual activity: Yes  Other Topics Concern   Not on file  Social History Narrative   Not on  file   Social Determinants of Health   Financial Resource Strain:    Difficulty of Paying Living Expenses: Not on file  Food Insecurity:    Worried About Charity fundraiser in the Last Year: Not on file   [employer] of Food in the Last Year: Not on file  Transportation Needs:    Lack of Transportation (Medical): Not on file   Lack of Transportation (Non-Medical): Not on file  Physical Activity:    Days of Exercise per Week: Not on file   Minutes of Exercise per Session: Not on file  Stress:    Feeling of Stress : Not on file  Social Connections:    Frequency of Communication with Friends and Family: Not on file   Frequency of Social Gatherings with Friends and Family: Not on file   Attends Religious Services: Not on file   Active Member of Clubs or Organizations: Not on file   Attends Archivist Meetings: Not on file   Marital Status: Not on file  Intimate Partner Violence:    Fear of Current or Ex-Partner: Not on file   Emotionally Abused: Not on file   Physically Abused: Not on file   Sexually Abused: Not on file    FAMILY HISTORY:  Family History  Problem Relation Age of Onset   Aneurysm Mother    Heart attack Father    Colon cancer Maternal Uncle    Cancer Brother    Gastric cancer Neg Hx    Esophageal cancer Neg Hx     CURRENT MEDICATIONS:  Current Outpatient Medications  Medication Sig Dispense Refill   amLODipine (NORVASC) 10 MG tablet TAKE ONE TABLET (10MG TOTAL) BY MOUTH DAILY 90 tablet 3   aspirin EC 81 MG tablet Take 81 mg by mouth daily.     Cholecalciferol (VITAMIN D3) 5000 units CAPS Take 5,000 Units by mouth daily.     dabrafenib mesylate (TAFINLAR) 50 MG capsule TAKE 2 CAPSULES (100 MG TOTAL) BY MOUTH 2 (TWO) TIMES DAILY. TAKE ON AN EMPTY STOMACH 1 HOUR BEFORE OR 2 HOURS AFTER MEALS. 120 capsule 3   escitalopram (LEXAPRO) 10 MG tablet Take 10 mg by mouth daily.      glimepiride (AMARYL) 2 MG tablet Take 2 mg by  mouth daily with breakfast.      isosorbide mononitrate (IMDUR) 30 MG 24 hr tablet TAKE ONE TABLET (30MG TOTAL) BY MOUTH DAILY 90 tablet 2   metFORMIN (GLUCOPHAGE) 500 MG tablet Take 1 tablet (500 mg total) by mouth 2 (two) times daily with a meal.     metoprolol (LOPRESSOR) 100 MG tablet Take 100 mg by mouth 2 (two) times daily.     nitroGLYCERIN (NITROSTAT) 0.4 MG SL tablet Place 0.4 mg under the tongue every 5 (five) minutes as needed for  chest pain.      ondansetron (ZOFRAN) 8 MG tablet Take 1 tablet (8 mg total) by mouth every 8 (eight) hours as needed for nausea or vomiting. 30 tablet 2   ONE TOUCH ULTRA TEST test strip      pioglitazone (ACTOS) 45 MG tablet Take 45 mg by mouth daily.     prochlorperazine (COMPAZINE) 10 MG tablet Take 1 tablet (10 mg total) by mouth every 6 (six) hours as needed for nausea or vomiting. 30 tablet 2   sitaGLIPtin (JANUVIA) 50 MG tablet Take 50 mg by mouth daily.      trametinib dimethyl sulfoxide (MEKINIST) 0.5 MG tablet TAKE 2 TABLETS (1 MG TOTAL) BY MOUTH DAILY. TAKE 1 HOUR BEFORE OR 2 HOURS AFTER A MEAL. STORE REFRIGERATED IN ORIGINAL CONTAINER. 60 tablet 3   Current Facility-Administered Medications  Medication Dose Route Frequency Provider Last Rate Last Admin   influenza vaccine adjuvanted (FLUAD) injection 0.5 mL  0.5 mL Intramuscular Once Derek Jack, MD        ALLERGIES:  No Known Allergies  PHYSICAL EXAM:  Performance status (ECOG): 1 - Symptomatic but completely ambulatory  Vitals:   03/12/20 1539  BP: 138/62  Pulse: 62  Resp: 18  Temp: (!) 96.9 F (36.1 C)  SpO2: 97%   Wt Readings from Last 3 Encounters:  03/12/20 194 lb 11.2 oz (88.3 kg)  12/04/19 199 lb 4.8 oz (90.4 kg)  09/27/19 198 lb 3.2 oz (89.9 kg)   Physical Exam Vitals reviewed.  Constitutional:      Appearance: Normal appearance.  Cardiovascular:     Rate and Rhythm: Normal rate and regular rhythm.     Pulses: Normal pulses.     Heart sounds:  Normal heart sounds.  Pulmonary:     Effort: Pulmonary effort is normal.     Breath sounds: Normal breath sounds.  Abdominal:     Palpations: Abdomen is soft.     Tenderness: There is no abdominal tenderness.  Musculoskeletal:     Right lower leg: No edema.     Left lower leg: No edema.  Neurological:     General: No focal deficit present.     Mental Status: [name] is alert and oriented to person, place, and time.  Psychiatric:        Mood and Affect: Mood normal.        Behavior: Behavior normal.      LABORATORY DATA:  I have reviewed the labs as listed.  CBC Latest Ref Rng & Units 03/05/2020 11/27/2019 09/27/2019  WBC 4.0 - 10.5 K/uL 4.2 6.4 6.4  Hemoglobin 13.0 - 17.0 g/dL 12.3(L) 12.7(L) 12.8(L)  Hematocrit 39 - 52 % 36.5(L) 38.2(L) 39.3  Platelets 150 - 400 K/uL 182 169 180   CMP Latest Ref Rng & Units 03/05/2020 11/27/2019 09/27/2019  Glucose 70 - 99 mg/dL 201(H) 206(H) 180(H)  BUN 8 - 23 mg/dL '16 22 21  ' Creatinine 0.61 - 1.24 mg/dL 1.47(H) 1.71(H) 1.69(H)  Sodium 135 - 145 mmol/L 133(L) 132(L) 132(L)  Potassium 3.5 - 5.1 mmol/L 4.0 4.4 5.0  Chloride 98 - 111 mmol/L 94(L) 99 99  CO2 22 - 32 mmol/L '29 22 26  ' Calcium 8.9 - 10.3 mg/dL 9.1 8.6(L) 8.9  Total Protein 6.5 - 8.1 g/dL 6.9 7.0 7.1  Total Bilirubin 0.3 - 1.2 mg/dL 0.9 0.5 0.8  Alkaline Phos 38 - 126 U/L 92 126 104  AST 15 - 41 U/L '20 26 18  ' ALT 0 - 44 U/L  '15 17 14    ' DIAGNOSTIC IMAGING:  I have independently reviewed the scans and discussed with the patient. CT Chest W Contrast  Result Date: 03/12/2020 CLINICAL DATA:  Metastatic lung cancer restaging. Prior chemotherapy. EXAM: CT CHEST WITH CONTRAST TECHNIQUE: Multidetector CT imaging of the chest was performed during intravenous contrast administration. CONTRAST:  73m OMNIPAQUE IOHEXOL 300 MG/ML SOLN. Reduced dose due to renal insufficiency. COMPARISON:  09/26/2019 FINDINGS: Cardiovascular: Coronary, aortic arch, and branch vessel atherosclerotic vascular  disease. Prior CABG. Mediastinum/Nodes: No pathologic adenopathy. Lungs/Pleura: Innumerable scattered bilateral pulmonary nodules under 1 cm in diameter, not appreciably changed from prior. Index left lower lobe nodule 0.7 by 0.6 cm on image 95 of series 4 common no change. Index right lower lobe nodule 0.7 by 0.6 cm on image 94 of series 4, no change. Airway thickening is present, suggesting bronchitis or reactive airways disease. Emphysema noted. Upper Abdomen: Cholecystectomy. Musculoskeletal: Thoracic spondylosis. IMPRESSION: 1. Innumerable scattered bilateral pulmonary nodules under 1 cm in diameter, not appreciably changed from prior. 2. Airway thickening is present, suggesting bronchitis or reactive airways disease. 3. Coronary, aortic arch, and branch vessel atherosclerotic vascular disease. Prior CABG. 4.  Aortic Atherosclerosis and Emphysema. Aortic Atherosclerosis (ICD10-I70.0) and Emphysema (ICD10-J43.9). Electronically Signed   By: WVan ClinesM.D.   On: 03/12/2020 08:21   ECHOCARDIOGRAM COMPLETE  Result Date: 03/05/2020    ECHOCARDIOGRAM REPORT   Patient Name:   Anthony HARDENBROOKDate of Exam: 03/05/2020 Medical Rec #:  [id number]    Height:       70.0 in Accession #:    [id number]   Weight:       199.3 lb Date of Birth:  5August 31, 1942    BSA:          2.084 m Patient Age:    89years      BP:           159/69 mmHg Patient Gender: M             HR:           62 bpm. Exam [facility]:  [facility]Procedure: 2D Echo, Cardiac Doppler and Color Doppler Indications:    Chemo V67.2 / Z09  History:        Patient has prior history of Echocardiogram examinations, most                 recent 07/16/2019. CAD, Prior CABG; Risk Factors:Diabetes and                 Hypertension. Adenocarcinoma of lung, stage 4.  Sonographer:    BAlvino ChapelRCS Referring Phys: [phone] 1. Left ventricular ejection fraction, by estimation, is 60 to 65%. The left ventricle has normal function. The  left ventricle has no regional wall motion abnormalities. There is mild left ventricular hypertrophy. Left ventricular diastolic parameters are indeterminate.  2. Right ventricular systolic function is normal. The right ventricular size is normal. There is normal pulmonary artery systolic pressure. The estimated right ventricular systolic pressure is 216.3mmHg.  3. Left atrial size was upper normal.  4. The mitral valve is grossly normal. Mild mitral valve regurgitation.  5. The aortic valve is tricuspid. Aortic valve regurgitation is not visualized. Mild aortic valve sclerosis is present, with no evidence of aortic valve stenosis.  6. The inferior vena cava is normal in size with greater than 50% respiratory variability, suggesting right atrial pressure of 3 mmHg.  7.  Prominent pericardial fat pad. FINDINGS  Left Ventricle: Left ventricular ejection fraction, by estimation, is 60 to 65%. The left ventricle has normal function. The left ventricle has no regional wall motion abnormalities. The left ventricular internal cavity size was normal in size. There is  mild left ventricular hypertrophy. Left ventricular diastolic parameters are indeterminate. Right Ventricle: The right ventricular size is normal. No increase in right ventricular wall thickness. Right ventricular systolic function is normal. There is normal pulmonary artery systolic pressure. The tricuspid regurgitant velocity is 2.52 m/s, and  with an assumed right atrial pressure of 3 mmHg, the estimated right ventricular systolic pressure is 94.0 mmHg. Left Atrium: Left atrial size was upper normal. Right Atrium: Right atrial size was normal in size. Pericardium: There is no evidence of pericardial effusion. Presence of pericardial fat pad. Mitral Valve: The mitral valve is grossly normal. Mild mitral valve regurgitation. Tricuspid Valve: The tricuspid valve is grossly normal. Tricuspid valve regurgitation is trivial. Aortic Valve: The aortic valve is  tricuspid. Aortic valve regurgitation is not visualized. Mild aortic valve sclerosis is present, with no evidence of aortic valve stenosis. Pulmonic Valve: The pulmonic valve was grossly normal. Pulmonic valve regurgitation is trivial. Aorta: The aortic root is normal in size and structure. Venous: The inferior vena cava is normal in size with greater than 50% respiratory variability, suggesting right atrial pressure of 3 mmHg. IAS/Shunts: No atrial level shunt detected by color flow Doppler.  LEFT VENTRICLE PLAX 2D LVIDd:         4.16 cm  Diastology LVIDs:         2.33 cm  LV e' medial:    5.33 cm/s LV PW:         1.04 cm  LV E/e' medial:  12.0 LV IVS:        1.10 cm  LV e' lateral:   7.83 cm/s LVOT diam:     2.20 cm  LV E/e' lateral: 8.1 LV SV:         84 LV SV Index:   40 LVOT Area:     3.80 cm  RIGHT VENTRICLE RV S prime:     7.72 cm/s TAPSE (M-mode): 1.6 cm LEFT ATRIUM             Index       RIGHT ATRIUM           Index LA diam:        3.90 cm 1.87 cm/m  RA Area:     17.80 cm LA Vol (A2C):   39.4 ml 18.90 ml/m RA Volume:   52.70 ml  25.29 ml/m LA Vol (A4C):   68.3 ml 32.77 ml/m LA Biplane Vol: 55.8 ml 26.77 ml/m  AORTIC VALVE LVOT Vmax:   103.00 cm/s LVOT Vmean:  64.000 cm/s LVOT VTI:    0.222 m  AORTA Ao Root diam: 3.80 cm MITRAL VALVE               TRICUSPID VALVE MV Area (PHT): 2.62 cm    TR Peak grad:   25.4 mmHg MV Decel Time: 289 msec    TR Vmax:        252.00 cm/s MV E velocity: 63.70 cm/s MV A velocity: 90.60 cm/s  SHUNTS MV E/A ratio:  0.70        Systemic VTI:  0.22 m  Systemic Diam: 2.20 cm Rozann Lesches MD Electronically signed by Rozann Lesches MD Signature Date/Time: 03/05/2020/11:22:45 AM    Final      ASSESSMENT:  1. Metastatic adenocarcinoma of the lung, BRAF V6 entity mutation positive: -Dabrafenib 100 mg twice daily and trametinib 1.5 mg once daily started on 10/07/2017, trametinib dose reduced to 1 mg subsequently. -Last CT of the chest on  09/26/2019 showed stable lung nodules. -CT chest with contrast on 03/11/2020 shows innumerable scattered bilateral pulmonary nodules under 1 cm in diameter, not appreciably changed from prior scan.  2. High risk drug monitoring: -Echocardiogram on 07/16/2019 shows EF 60-65% with grade 1 diastolic dysfunction. -Echo on 03/05/2020 with EF 60-65%.   PLAN:  1. Metastatic adenocarcinoma of the lung, BRAF V6 entity mutation positive: -[name] is tolerating dabrafenib and trametinib very well. -I have reviewed his CT scan reports with the patient. -Reviewed labs from 03/05/2020 which showed normal chemistries.  Albumin is low at 3.3.  Encouraged high-protein intake.  CBC was normal. -We will plan to see him back in 3 months with repeat labs.  Plan to repeat CT scan in 6 months.  2. High risk drug monitoring: -Reviewed results of echocardiogram from 03/05/2020 with EF 60-65%.  3. Hypertension: -Blood pressure is 138/62.  Continue metoprolol and amlodipine.  4. Diabetes: -Continue Metformin, Actos and Januvia.   Orders placed this encounter:  No orders of the defined types were placed in this encounter.    Derek Jack, MD [place] [phone]   I, Milinda Antis, am acting as a scribe for Dr. Sanda Linger.  I, Derek Jack MD, have reviewed the above documentation for accuracy and completeness, and I agree with the above.

## 2020-03-13 DIAGNOSIS — R7309 Other abnormal glucose: Secondary | ICD-10-CM | POA: Diagnosis not present

## 2020-03-13 DIAGNOSIS — I25111 Atherosclerotic heart disease of native coronary artery with angina pectoris with documented spasm: Secondary | ICD-10-CM | POA: Diagnosis not present

## 2020-03-13 DIAGNOSIS — N1831 Chronic kidney disease, stage 3a: Secondary | ICD-10-CM | POA: Diagnosis not present

## 2020-03-13 DIAGNOSIS — E1122 Type 2 diabetes mellitus with diabetic chronic kidney disease: Secondary | ICD-10-CM | POA: Diagnosis not present

## 2020-03-24 ENCOUNTER — Ambulatory Visit: Payer: Medicare Other | Admitting: Cardiovascular Disease

## 2020-05-15 ENCOUNTER — Other Ambulatory Visit (HOSPITAL_COMMUNITY): Payer: Self-pay

## 2020-05-15 DIAGNOSIS — C3490 Malignant neoplasm of unspecified part of unspecified bronchus or lung: Secondary | ICD-10-CM

## 2020-05-15 MED ORDER — TRAMETINIB DIMETHYL SULFOXIDE 0.5 MG PO TABS: ORAL_TABLET | ORAL | 3 refills | Status: DC

## 2020-05-15 MED ORDER — DABRAFENIB MESYLATE 50 MG PO CAPS: ORAL_CAPSULE | ORAL | 3 refills | Status: DC

## 2020-05-15 NOTE — Telephone Encounter (Signed)
Chart reviewed. Patient's Mekinist and Tafinlar refilled per Dr. Delton Coombes

## 2020-05-19 ENCOUNTER — Other Ambulatory Visit (HOSPITAL_COMMUNITY): Payer: Self-pay

## 2020-05-19 DIAGNOSIS — C3490 Malignant neoplasm of unspecified part of unspecified bronchus or lung: Secondary | ICD-10-CM

## 2020-05-19 MED ORDER — DABRAFENIB MESYLATE 50 MG PO CAPS: ORAL_CAPSULE | ORAL | 3 refills | Status: DC

## 2020-05-22 ENCOUNTER — Telehealth (HOSPITAL_COMMUNITY): Payer: Self-pay | Admitting: Pharmacy Technician

## 2020-05-29 NOTE — Telephone Encounter (Signed)
Oral Oncology Patient Advocate Encounter   Submitted patient application on 44/45/84 for renewal in to [employer] so patient could continue to obtain Mekinist and Tafinlar at no cost.  Received notification from [employer] that patient has been successfully enrolled into their program to receive Mekinist and Tafinlar from the manufacturer at $0 out of pocket until 05/16/21.    Patient knows to call the office with questions or concerns.   Oral Oncology Clinic will continue to follow.  [place] Patient [place] Phone [phone] Fax [phone] 05/29/2020 4:15 PM

## 2020-06-03 NOTE — Telephone Encounter (Signed)
Oral Oncology Patient Advocate Encounter  Received notification from [employer] Patient Assistance program that patient has been successfully enrolled into their program to receive Mekinist and Tafinlar from the manufacturer at $0 out of pocket until 05/16/21.    Specialty Pharmacy that will dispense medication is [facility].  Patient knows to call the office with questions or concerns.   Oral Oncology Clinic will continue to follow.  [place] Patient [place] Phone [phone] Fax [phone] 06/03/2020 2:25 PM

## 2020-06-11 ENCOUNTER — Other Ambulatory Visit: Payer: Self-pay

## 2020-06-11 ENCOUNTER — Inpatient Hospital Stay (HOSPITAL_COMMUNITY): Payer: Medicare Other | Attending: Hematology

## 2020-06-11 DIAGNOSIS — I129 Hypertensive chronic kidney disease with stage 1 through stage 4 chronic kidney disease, or unspecified chronic kidney disease: Secondary | ICD-10-CM | POA: Insufficient documentation

## 2020-06-11 DIAGNOSIS — Z87891 Personal history of nicotine dependence: Secondary | ICD-10-CM | POA: Diagnosis not present

## 2020-06-11 DIAGNOSIS — E1122 Type 2 diabetes mellitus with diabetic chronic kidney disease: Secondary | ICD-10-CM | POA: Insufficient documentation

## 2020-06-11 DIAGNOSIS — Z79899 Other long term (current) drug therapy: Secondary | ICD-10-CM | POA: Diagnosis not present

## 2020-06-11 DIAGNOSIS — Z7984 Long term (current) use of oral hypoglycemic drugs: Secondary | ICD-10-CM | POA: Insufficient documentation

## 2020-06-11 DIAGNOSIS — C3490 Malignant neoplasm of unspecified part of unspecified bronchus or lung: Secondary | ICD-10-CM | POA: Insufficient documentation

## 2020-06-11 DIAGNOSIS — N189 Chronic kidney disease, unspecified: Secondary | ICD-10-CM | POA: Insufficient documentation

## 2020-06-11 LAB — CBC WITH DIFFERENTIAL/PLATELET
Abs Immature Granulocytes: 0.05 10*3/uL (ref 0.00–0.07)
Basophils Absolute: 0 10*3/uL (ref 0.0–0.1)
Basophils Relative: 0 %
Eosinophils Absolute: 0.1 10*3/uL (ref 0.0–0.5)
Eosinophils Relative: 1 %
HCT: 38.6 % — ABNORMAL LOW (ref 39.0–52.0)
Hemoglobin: 12.7 g/dL — ABNORMAL LOW (ref 13.0–17.0)
Immature Granulocytes: 1 %
Lymphocytes Relative: 21 %
Lymphs Abs: 2 10*3/uL (ref 0.7–4.0)
MCH: 28.1 pg (ref 26.0–34.0)
MCHC: 32.9 g/dL (ref 30.0–36.0)
MCV: 85.4 fL (ref 80.0–100.0)
Monocytes Absolute: 0.7 10*3/uL (ref 0.1–1.0)
Monocytes Relative: 7 %
Neutro Abs: 6.6 10*3/uL (ref 1.7–7.7)
Neutrophils Relative %: 70 %
Platelets: 233 10*3/uL (ref 150–400)
RBC: 4.52 MIL/uL (ref 4.22–5.81)
RDW: 15.3 % (ref 11.5–15.5)
WBC: 9.5 10*3/uL (ref 4.0–10.5)
nRBC: 0 % (ref 0.0–0.2)

## 2020-06-11 LAB — COMPREHENSIVE METABOLIC PANEL
ALT: 13 U/L (ref 0–44)
AST: 18 U/L (ref 15–41)
Albumin: 3.4 g/dL — ABNORMAL LOW (ref 3.5–5.0)
Alkaline Phosphatase: 86 U/L (ref 38–126)
Anion gap: 8 (ref 5–15)
BUN: 17 mg/dL (ref 8–23)
CO2: 26 mmol/L (ref 22–32)
Calcium: 9 mg/dL (ref 8.9–10.3)
Chloride: 97 mmol/L — ABNORMAL LOW (ref 98–111)
Creatinine, Ser: 1.6 mg/dL — ABNORMAL HIGH (ref 0.61–1.24)
GFR, Estimated: 43 mL/min — ABNORMAL LOW (ref >60)
Glucose, Bld: 311 mg/dL — ABNORMAL HIGH (ref 70–99)
Potassium: 4.3 mmol/L (ref 3.5–5.1)
Sodium: 131 mmol/L — ABNORMAL LOW (ref 135–145)
Total Bilirubin: 0.7 mg/dL (ref 0.3–1.2)
Total Protein: 6.6 g/dL (ref 6.5–8.1)

## 2020-06-11 LAB — TSH: TSH: 1.622 u[IU]/mL (ref 0.350–4.500)

## 2020-06-12 ENCOUNTER — Inpatient Hospital Stay (HOSPITAL_COMMUNITY): Payer: Medicare Other | Admitting: Hematology

## 2020-06-12 ENCOUNTER — Ambulatory Visit (HOSPITAL_COMMUNITY)
Admission: RE | Admit: 2020-06-12 | Discharge: 2020-06-12 | Disposition: A | Discharge status: Home / Self Care | Payer: Medicare Other | Source: Ambulatory Visit | Attending: Hematology | Admitting: Hematology

## 2020-06-12 VITALS — BP 145/76 | HR 110 | Temp 97.8°F | Resp 18 | Wt 192.4 lb

## 2020-06-12 DIAGNOSIS — Z7984 Long term (current) use of oral hypoglycemic drugs: Secondary | ICD-10-CM | POA: Diagnosis not present

## 2020-06-12 DIAGNOSIS — C3490 Malignant neoplasm of unspecified part of unspecified bronchus or lung: Secondary | ICD-10-CM

## 2020-06-12 DIAGNOSIS — I129 Hypertensive chronic kidney disease with stage 1 through stage 4 chronic kidney disease, or unspecified chronic kidney disease: Secondary | ICD-10-CM | POA: Diagnosis not present

## 2020-06-12 DIAGNOSIS — R911 Solitary pulmonary nodule: Secondary | ICD-10-CM | POA: Diagnosis not present

## 2020-06-12 DIAGNOSIS — N189 Chronic kidney disease, unspecified: Secondary | ICD-10-CM | POA: Diagnosis not present

## 2020-06-12 DIAGNOSIS — E1122 Type 2 diabetes mellitus with diabetic chronic kidney disease: Secondary | ICD-10-CM | POA: Diagnosis not present

## 2020-06-12 DIAGNOSIS — Z79899 Other long term (current) drug therapy: Secondary | ICD-10-CM | POA: Diagnosis not present

## 2020-06-12 DIAGNOSIS — Z87891 Personal history of nicotine dependence: Secondary | ICD-10-CM | POA: Diagnosis not present

## 2020-06-12 NOTE — Progress Notes (Signed)
[place], [place] [address]   CLINIC:  Medical Oncology/Hematology  PCP:  [name], MD [address] / [place] [address] [phone]   REASON FOR VISIT:  Follow-up for metastatic lung cancer  PRIOR THERAPY: None  NGS Results: Not done  CURRENT THERAPY: Dabrafenib 100 mg BID and trametinib 1 mg daily  BRIEF ONCOLOGIC HISTORY:  Oncology History   No history exists.    CANCER STAGING: Cancer Staging No matching staging information was found for the patient.  INTERVAL HISTORY:  [name], a 80 y.o. male, returns for routine follow-up of his metastatic lung cancer. [name] was last seen on 03/12/2020.   Today he reports feeling well. He continues having a cough productive of white sputum. He is tolerating the dabrafenib and trametinib well and takes them on an empty stomach. He notes that he needs to rest after a short amount of activity.   REVIEW OF SYSTEMS:  Review of Systems  Constitutional: Positive for fatigue (90%). Negative for appetite change.  Respiratory: Positive for cough (productive).   All other systems reviewed and are negative.   PAST MEDICAL/SURGICAL HISTORY:  Past Medical History:  Diagnosis Date  . Cancer ([place])   . Chronic kidney disease   . Coronary artery disease   . Diabetes mellitus   . Hypertension   . 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 Spontaneous pneumothorax 09/15/2010   right   Past Surgical History:  Procedure Laterality Date  . ANKLE FRACTURE SURGERY  2008   [facility]  . APPENDECTOMY  1960's  . CHEST TUBE INSERTION Right 09/15/2010   Dr Servando Snare - spontaneous PTX  . CHOLECYSTECTOMY  2008  . COLONOSCOPY N/A 09/07/2017   Procedure: COLONOSCOPY;  Surgeon: Daneil Dolin, MD;  Location: [facility] ENDO SU[facility];  Service: Endoscopy;  Laterality: N/A;  10:30am  . CORONARY  ARTERY BYPASS GRAFT  2003  . EYE SURGERY  2001   Cataracts removed bilaterally  . INCISIONAL HERNIA REPAIR N/A 01/29/2015   Procedure: Fatima Blank HERNIORRHAPHY WITH MESH;  Surgeon: Aviva Signs, MD;  Location: [facility] ORS;  Service: General;  Laterality: N/A;  . INSERTION OF MESH N/A 01/29/2015   Procedure: INSERTION OF MESH;  Surgeon: Aviva Signs, MD;  Location: [facility] ORS;  Service: General;  Laterality: N/A;  . [place]  . LEFT HEART CATH AND CORS/GRAFTS ANGIOGRAPHY N/A 06/20/2017   Procedure: LEFT HEART CATH AND CORS/GRAFTS ANGIOGRAPHY;  Surgeon: Martinique, Peter M, MD;  Location: [place] CV LAB;  Service: Cardiovascular;  Laterality: N/A;  . ORIF ANKLE FRACTURE  08/26/2011   Procedure: OPEN REDUCTION INTERNAL FIXATION (ORIF) ANKLE FRACTURE;  Surgeon: Sanjuana Kava, MD;  Location: [facility] ORS;  Service: Orthopedics;  Laterality: Right;  . POLYPECTOMY  09/07/2017   Procedure: POLYPECTOMY;  Surgeon: Daneil Dolin, MD;  Location: [facility] ENDO SU[facility];  Service: Endoscopy;;  ascending x2 (cold snare)  . PROSTATE SURGERY  2012   Enlarged prostate  . TRANSURETHRAL RESECTION OF PROSTATE  2012    SOCIAL HISTORY:  Social History   Socioeconomic History  . Marital status: Married    Spouse name: Not on file  . Number of children: Not on file  . Years of education: Not on file  . Highest education level: Not on file  Occupational History  . Not on file  Tobacco Use  . Smoking status: Former Smoker    Packs/day: 1.00    Years: 33.00    Pack years: 33.00    Quit date: 05/17/1987    Years since quitting: 33.0  . Smokeless tobacco: Never Used  Vaping Use  . Vaping Use: Never used  Substance and Sexual Activity  . Alcohol use: No  . Drug use: No  . Sexual activity: Yes  Other Topics Concern  . Not on file  Social History Narrative  . Not on file   Social Determinants of Health   Financial Resource Strain: Low Risk   . Difficulty of Paying Living Expenses: Not very  hard  Food Insecurity: No Food Insecurity  . Worried About Charity fundraiser in the Last Year: Never true  . Ran Out of Food in the Last Year: Never true  Transportation Needs: No Transportation Needs  . Lack of Transportation (Medical): No  . Lack of Transportation (Non-Medical): No  Physical Activity: Inactive  . Days of Exercise per Week: 0 days  . Minutes of Exercise per Session: 0 min  Stress: No Stress Concern Present  . Feeling of Stress : Not at all  Social Connections: Socially Integrated  . Frequency of Communication with Friends and Family: More than three times a week  . Frequency of Social Gatherings with Friends and Family: Three times a week  . Attends Religious Services: 1 to 4 times per year  . Active Member of Clubs or Organizations: No  . Attends Archivist Meetings: 1 to 4 times per year  . Marital Status: Married  Human resources officer Violence: Not At Risk  . Fear of Current or Ex-Partner: No  . Emotionally Abused: No  . Physically Abused: No  . Sexually Abused: No    FAMILY HISTORY:  Family History  Problem Relation Age of Onset  . Aneurysm Mother   . Heart attack Father   . Colon cancer Maternal Uncle   . Cancer Brother   . Gastric cancer Neg Hx   . Esophageal cancer Neg Hx     CURRENT MEDICATIONS:  Current Outpatient Medications  Medication Sig Dispense Refill  . amLODipine (NORVASC) 10 MG tablet TAKE ONE TABLET (10MG TOTAL) BY MOUTH DAILY 90 tablet 3  . aspirin EC 81 MG tablet Take 81 mg by mouth daily.    . Cholecalciferol (VITAMIN D3) 5000 units CAPS Take 5,000 Units by mouth daily.    Marland Kitchen dabrafenib mesylate (TAFINLAR) 50 MG capsule TAKE 2 CAPSULES (100 MG TOTAL) BY MOUTH 2 (TWO) TIMES DAILY. TAKE ON AN EMPTY STOMACH 1 HOUR BEFORE OR 2 HOURS AFTER MEALS. 120 capsule 3  . escitalopram (LEXAPRO) 10 MG tablet Take 10 mg by mouth daily.     Marland Kitchen glimepiride (AMARYL) 2 MG tablet Take 2 mg by mouth daily with breakfast.     . isosorbide  mononitrate (IMDUR) 30 MG 24 hr tablet TAKE ONE TABLET (30MG TOTAL) BY MOUTH DAILY 90 tablet 2  . metFORMIN (GLUCOPHAGE) 500 MG tablet Take 1 tablet (500 mg total) by mouth 2 (two) times daily with a meal.    . metoprolol (LOPRESSOR) 100 MG tablet Take 100 mg by mouth 2 (two) times daily.    . nitroGLYCERIN (NITROSTAT) 0.4 MG SL tablet Place 0.4 mg under the tongue every 5 (five) minutes as needed for chest pain.     Marland Kitchen ondansetron (ZOFRAN) 8 MG tablet Take 1 tablet (8 mg  total) by mouth every 8 (eight) hours as needed for nausea or vomiting. 30 tablet 2  . ONE TOUCH ULTRA TEST test strip     . pioglitazone (ACTOS) 45 MG tablet Take 45 mg by mouth daily.    . prochlorperazine (COMPAZINE) 10 MG tablet Take 1 tablet (10 mg total) by mouth every 6 (six) hours as needed for nausea or vomiting. 30 tablet 2  . sitaGLIPtin (JANUVIA) 50 MG tablet Take 50 mg by mouth daily.     . trametinib dimethyl sulfoxide (MEKINIST) 0.5 MG tablet TAKE 2 TABLETS (1 MG TOTAL) BY MOUTH DAILY. TAKE 1 HOUR BEFORE OR 2 HOURS AFTER A MEAL. STORE REFRIGERATED IN ORIGINAL CONTAINER. 60 tablet 3   No current facility-administered medications for this visit.    ALLERGIES:  No Known Allergies  PHYSICAL EXAM:  Performance status (ECOG): 1 - Symptomatic but completely ambulatory  Vitals:   06/12/20 1531  BP: (!) 145/76  Pulse: (!) 110  Resp: 18  Temp: 97.8 F (36.6 C)  SpO2: 94%   Wt Readings from Last 3 Encounters:  06/12/20 192 lb 6 oz (87.3 kg)  03/12/20 194 lb 11.2 oz (88.3 kg)  12/04/19 199 lb 4.8 oz (90.4 kg)   Physical Exam Vitals reviewed.  Constitutional:      Appearance: Normal appearance.  Cardiovascular:     Rate and Rhythm: Normal rate and regular rhythm.     Pulses: Normal pulses.     Heart sounds: Normal heart sounds.  Pulmonary:     Effort: Pulmonary effort is normal.     Breath sounds: Normal breath sounds.  Neurological:     General: No focal deficit present.     Mental Status: He is  alert and oriented to person, place, and time.  Psychiatric:        Mood and Affect: Mood normal.        Behavior: Behavior normal.      LABORATORY DATA:  I have reviewed the labs as listed.  CBC Latest Ref Rng & Units 06/11/2020 03/05/2020 11/27/2019  WBC 4.0 - 10.5 K/uL 9.5 4.2 6.4  Hemoglobin 13.0 - 17.0 g/dL 12.7(L) 12.3(L) 12.7(L)  Hematocrit 39.0 - 52.0 % 38.6(L) 36.5(L) 38.2(L)  Platelets 150 - 400 K/uL 233 182 169   CMP Latest Ref Rng & Units 06/11/2020 03/05/2020 11/27/2019  Glucose 70 - 99 mg/dL 311(H) 201(H) 206(H)  BUN 8 - 23 mg/dL '17 16 22  ' Creatinine 0.61 - 1.24 mg/dL 1.60(H) 1.47(H) 1.71(H)  Sodium 135 - 145 mmol/L 131(L) 133(L) 132(L)  Potassium 3.5 - 5.1 mmol/L 4.3 4.0 4.4  Chloride 98 - 111 mmol/L 97(L) 94(L) 99  CO2 22 - 32 mmol/L '26 29 22  ' Calcium 8.9 - 10.3 mg/dL 9.0 9.1 8.6(L)  Total Protein 6.5 - 8.1 g/dL 6.6 6.9 7.0  Total Bilirubin 0.3 - 1.2 mg/dL 0.7 0.9 0.5  Alkaline Phos 38 - 126 U/L 86 92 126  AST 15 - 41 U/L '18 20 26  ' ALT 0 - 44 U/L '13 15 17    ' DIAGNOSTIC IMAGING:  I have independently reviewed the scans and discussed with the patient. No results found.   ASSESSMENT:  1. 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 -Last CT of the chest on 09/26/2019 showed stable lung nodules. -CT chest with contrast on 03/11/2020 shows innumerable scattered bilateral pulmonary nodules under 1  cm in diameter, not appreciably changed from prior scan.  2. High risk drug monitoring: -Echocardiogram on 07/16/2019 shows EF 60-65% with grade 1 diastolic dysfunction. -Echo on 03/05/2020 with EF 60-65%.   PLAN:  1. 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 -Reviewed his labs which showed normal LFTs.  White count and platelets were  normal. -Recommend RTC in 3 months with repeat CT of the chest with contrast.  2. High risk drug monitoring: -Echo on 03/05/2020 with EF 60 to 65%.  3. Hypertension: -Blood pressure is 145/76.  Continue metoprolol and amlodipine.  4. Diabetes: -Continue Metformin, Actos and Januvia.  5.  New slight worsening of the cough: -He reported cold infection during December and has lingering slight worsening of his baseline cough since then.  He reports clear expectoration which is occasionally yellow. -We have done chest x-ray and reviewed the films.  No sign of infection.  Small lung nodules from his cancer present.   Orders placed this encounter:  Orders Placed This Encounter  Procedures  . DG Chest 2 View  . CT Chest W Contrast  . CBC with Differential/Platelet  . Comprehensive metabolic panel     Derek Jack, MD [place] [phone]   I, Milinda Antis, am acting as a scribe for Dr. Sanda Linger.  I, Derek Jack MD, have reviewed the above documentation for accuracy and completeness, and I agree with the above.

## 2020-06-12 NOTE — Patient Instructions (Signed)
[place] at [facility] Discharge Instructions  You were seen today by Dr. Delton Coombes. He went over your recent results. You will be scheduled for a chest x-ray today. You will then be scheduled for a CT scan of your chest before your next visit. Dr. Delton Coombes will see you back in 3 months for labs and follow up.   Thank you for choosing [place] at [facility] to provide your oncology and hematology care.  To afford each patient quality time with our provider, please arrive at least 15 minutes before your scheduled appointment time.   If you have a lab appointment with the [facility] please come in thru the Main Entrance and check in at the main information desk  You need to re-schedule your appointment should you arrive 10 or more minutes late.  We strive to give you quality time with our providers, and arriving late affects you and other patients whose appointments are after yours.  Also, if you no show three or more times for appointments you may be dismissed from the clinic at the providers discretion.     Again, thank you for choosing [facility].  Our hope is that these requests will decrease the amount of time that you wait before being seen by our physicians.       _____________________________________________________________  Should you have questions after your visit to [facility], please contact our office at [phone] between the hours of 8:00 a.m. and 4:30 p.m.  Voicemails left after 4:00 p.m. will not be returned until the following business day.  For prescription refill requests, have your pharmacy contact our office and allow 72 hours.    Cancer Center Support Programs:   > Cancer Support Group  2nd Tuesday of the month 1pm-2pm, Journey Room

## 2020-06-13 ENCOUNTER — Telehealth: Payer: Self-pay | Admitting: Internal Medicine

## 2020-06-13 NOTE — Telephone Encounter (Signed)
Pt states he has had his covid vaccines.  Pt reports getting the booster as well.  He will bring his covid vaccine card to next [facility] dr appt to be scanned in.

## 2020-07-08 DIAGNOSIS — N183 Chronic kidney disease, stage 3 unspecified: Secondary | ICD-10-CM | POA: Diagnosis not present

## 2020-07-08 DIAGNOSIS — E1129 Type 2 diabetes mellitus with other diabetic kidney complication: Secondary | ICD-10-CM | POA: Diagnosis not present

## 2020-07-08 DIAGNOSIS — I2510 Atherosclerotic heart disease of native coronary artery without angina pectoris: Secondary | ICD-10-CM | POA: Diagnosis not present

## 2020-07-08 DIAGNOSIS — C3490 Malignant neoplasm of unspecified part of unspecified bronchus or lung: Secondary | ICD-10-CM | POA: Diagnosis not present

## 2020-07-08 DIAGNOSIS — Z79899 Other long term (current) drug therapy: Secondary | ICD-10-CM | POA: Diagnosis not present

## 2020-07-09 ENCOUNTER — Other Ambulatory Visit: Payer: Self-pay | Admitting: Student

## 2020-07-09 DIAGNOSIS — I10 Essential (primary) hypertension: Secondary | ICD-10-CM

## 2020-07-09 MED ORDER — AMLODIPINE BESYLATE 10 MG PO TABS: 10.0000 mg | ORAL_TABLET | Freq: Every day | ORAL | 0 refills | Status: DC

## 2020-07-14 ENCOUNTER — Encounter (HOSPITAL_COMMUNITY): Payer: Self-pay | Admitting: Emergency Medicine

## 2020-07-14 ENCOUNTER — Inpatient Hospital Stay (HOSPITAL_COMMUNITY)
Admission: EM | Admit: 2020-07-14 | Discharge: 2020-07-17 | DRG: 536 | Disposition: A | Discharge status: Skilled Nursing Facility | Payer: Medicare Other | Attending: Internal Medicine | Admitting: Internal Medicine

## 2020-07-14 ENCOUNTER — Emergency Department (HOSPITAL_COMMUNITY): Payer: Medicare Other

## 2020-07-14 ENCOUNTER — Other Ambulatory Visit: Payer: Self-pay

## 2020-07-14 DIAGNOSIS — W010XXA Fall on same level from slipping, tripping and stumbling without subsequent striking against object, initial encounter: Secondary | ICD-10-CM | POA: Diagnosis present

## 2020-07-14 DIAGNOSIS — N401 Enlarged prostate with lower urinary tract symptoms: Secondary | ICD-10-CM | POA: Diagnosis present

## 2020-07-14 DIAGNOSIS — I10 Essential (primary) hypertension: Secondary | ICD-10-CM | POA: Diagnosis not present

## 2020-07-14 DIAGNOSIS — N1832 Chronic kidney disease, stage 3b: Secondary | ICD-10-CM | POA: Diagnosis present

## 2020-07-14 DIAGNOSIS — S72102A Unspecified trochanteric fracture of left femur, initial encounter for closed fracture: Secondary | ICD-10-CM | POA: Diagnosis not present

## 2020-07-14 DIAGNOSIS — R269 Unspecified abnormalities of gait and mobility: Secondary | ICD-10-CM | POA: Diagnosis present

## 2020-07-14 DIAGNOSIS — R338 Other retention of urine: Secondary | ICD-10-CM | POA: Diagnosis present

## 2020-07-14 DIAGNOSIS — Z8249 Family history of ischemic heart disease and other diseases of the circulatory system: Secondary | ICD-10-CM | POA: Diagnosis not present

## 2020-07-14 DIAGNOSIS — W19XXXA Unspecified fall, initial encounter: Secondary | ICD-10-CM | POA: Diagnosis not present

## 2020-07-14 DIAGNOSIS — S72109A Unspecified trochanteric fracture of unspecified femur, initial encounter for closed fracture: Secondary | ICD-10-CM | POA: Diagnosis present

## 2020-07-14 DIAGNOSIS — E1122 Type 2 diabetes mellitus with diabetic chronic kidney disease: Secondary | ICD-10-CM | POA: Diagnosis present

## 2020-07-14 DIAGNOSIS — Z951 Presence of aortocoronary bypass graft: Secondary | ICD-10-CM

## 2020-07-14 DIAGNOSIS — D631 Anemia in chronic kidney disease: Secondary | ICD-10-CM | POA: Diagnosis not present

## 2020-07-14 DIAGNOSIS — R197 Diarrhea, unspecified: Secondary | ICD-10-CM | POA: Diagnosis not present

## 2020-07-14 DIAGNOSIS — Z7984 Long term (current) use of oral hypoglycemic drugs: Secondary | ICD-10-CM

## 2020-07-14 DIAGNOSIS — C3490 Malignant neoplasm of unspecified part of unspecified bronchus or lung: Secondary | ICD-10-CM | POA: Diagnosis present

## 2020-07-14 DIAGNOSIS — N183 Chronic kidney disease, stage 3 unspecified: Secondary | ICD-10-CM | POA: Diagnosis not present

## 2020-07-14 DIAGNOSIS — Z87891 Personal history of nicotine dependence: Secondary | ICD-10-CM

## 2020-07-14 DIAGNOSIS — R0902 Hypoxemia: Secondary | ICD-10-CM | POA: Diagnosis not present

## 2020-07-14 DIAGNOSIS — Z20822 Contact with and (suspected) exposure to covid-19: Secondary | ICD-10-CM | POA: Diagnosis present

## 2020-07-14 DIAGNOSIS — M25552 Pain in left hip: Secondary | ICD-10-CM | POA: Diagnosis not present

## 2020-07-14 DIAGNOSIS — E119 Type 2 diabetes mellitus without complications: Secondary | ICD-10-CM | POA: Diagnosis not present

## 2020-07-14 DIAGNOSIS — R1110 Vomiting, unspecified: Secondary | ICD-10-CM | POA: Diagnosis not present

## 2020-07-14 DIAGNOSIS — I129 Hypertensive chronic kidney disease with stage 1 through stage 4 chronic kidney disease, or unspecified chronic kidney disease: Secondary | ICD-10-CM | POA: Diagnosis not present

## 2020-07-14 DIAGNOSIS — Z7982 Long term (current) use of aspirin: Secondary | ICD-10-CM

## 2020-07-14 DIAGNOSIS — S72002A Fracture of unspecified part of neck of left femur, initial encounter for closed fracture: Secondary | ICD-10-CM | POA: Diagnosis not present

## 2020-07-14 DIAGNOSIS — R5381 Other malaise: Secondary | ICD-10-CM | POA: Diagnosis not present

## 2020-07-14 DIAGNOSIS — Y92009 Unspecified place in unspecified non-institutional (private) residence as the place of occurrence of the external cause: Secondary | ICD-10-CM

## 2020-07-14 DIAGNOSIS — M6281 Muscle weakness (generalized): Secondary | ICD-10-CM | POA: Diagnosis not present

## 2020-07-14 DIAGNOSIS — D696 Thrombocytopenia, unspecified: Secondary | ICD-10-CM | POA: Diagnosis present

## 2020-07-14 DIAGNOSIS — I2510 Atherosclerotic heart disease of native coronary artery without angina pectoris: Secondary | ICD-10-CM | POA: Diagnosis not present

## 2020-07-14 DIAGNOSIS — S72115A Nondisplaced fracture of greater trochanter of left femur, initial encounter for closed fracture: Secondary | ICD-10-CM | POA: Diagnosis not present

## 2020-07-14 DIAGNOSIS — Z743 Need for continuous supervision: Secondary | ICD-10-CM | POA: Diagnosis not present

## 2020-07-14 DIAGNOSIS — R6889 Other general symptoms and signs: Secondary | ICD-10-CM | POA: Diagnosis not present

## 2020-07-14 DIAGNOSIS — J9811 Atelectasis: Secondary | ICD-10-CM | POA: Diagnosis not present

## 2020-07-14 DIAGNOSIS — Z79899 Other long term (current) drug therapy: Secondary | ICD-10-CM | POA: Diagnosis not present

## 2020-07-14 DIAGNOSIS — R911 Solitary pulmonary nodule: Secondary | ICD-10-CM | POA: Diagnosis not present

## 2020-07-14 DIAGNOSIS — S72115D Nondisplaced fracture of greater trochanter of left femur, subsequent encounter for closed fracture with routine healing: Secondary | ICD-10-CM | POA: Diagnosis not present

## 2020-07-14 DIAGNOSIS — R2689 Other abnormalities of gait and mobility: Secondary | ICD-10-CM | POA: Diagnosis not present

## 2020-07-14 DIAGNOSIS — D649 Anemia, unspecified: Secondary | ICD-10-CM | POA: Diagnosis not present

## 2020-07-14 LAB — GLUCOSE, CAPILLARY
Glucose-Capillary: 243 mg/dL — ABNORMAL HIGH (ref 70–99)
Glucose-Capillary: 159 mg/dL — ABNORMAL HIGH (ref 70–99)

## 2020-07-14 LAB — COMPREHENSIVE METABOLIC PANEL
ALT: 17 U/L (ref 0–44)
AST: 23 U/L (ref 15–41)
Albumin: 3.5 g/dL (ref 3.5–5.0)
Alkaline Phosphatase: 73 U/L (ref 38–126)
Anion gap: 12 (ref 5–15)
BUN: 25 mg/dL — ABNORMAL HIGH (ref 8–23)
CO2: 24 mmol/L (ref 22–32)
Calcium: 9.1 mg/dL (ref 8.9–10.3)
Chloride: 97 mmol/L — ABNORMAL LOW (ref 98–111)
Creatinine, Ser: 1.72 mg/dL — ABNORMAL HIGH (ref 0.61–1.24)
GFR, Estimated: 40 mL/min — ABNORMAL LOW (ref >60)
Glucose, Bld: 291 mg/dL — ABNORMAL HIGH (ref 70–99)
Potassium: 3.9 mmol/L (ref 3.5–5.1)
Sodium: 133 mmol/L — ABNORMAL LOW (ref 135–145)
Total Bilirubin: 0.9 mg/dL (ref 0.3–1.2)
Total Protein: 6.9 g/dL (ref 6.5–8.1)

## 2020-07-14 LAB — CK: Total CK: 44 U/L — ABNORMAL LOW (ref 49–397)

## 2020-07-14 LAB — RESP PANEL BY RT-PCR (FLU A&B, COVID) ARPGX2
Influenza A by PCR: NEGATIVE
Influenza B by PCR: NEGATIVE
SARS Coronavirus 2 by RT PCR: NEGATIVE

## 2020-07-14 LAB — CBC WITH DIFFERENTIAL/PLATELET
Abs Immature Granulocytes: 0.03 10*3/uL (ref 0.00–0.07)
Basophils Absolute: 0 10*3/uL (ref 0.0–0.1)
Basophils Relative: 0 %
Eosinophils Absolute: 0 10*3/uL (ref 0.0–0.5)
Eosinophils Relative: 0 %
HCT: 39 % (ref 39.0–52.0)
Hemoglobin: 13.4 g/dL (ref 13.0–17.0)
Immature Granulocytes: 1 %
Lymphocytes Relative: 4 %
Lymphs Abs: 0.3 10*3/uL — ABNORMAL LOW (ref 0.7–4.0)
MCH: 29 pg (ref 26.0–34.0)
MCHC: 34.4 g/dL (ref 30.0–36.0)
MCV: 84.4 fL (ref 80.0–100.0)
Monocytes Absolute: 0.5 10*3/uL (ref 0.1–1.0)
Monocytes Relative: 8 %
Neutro Abs: 5.6 10*3/uL (ref 1.7–7.7)
Neutrophils Relative %: 87 %
Platelets: 113 10*3/uL — ABNORMAL LOW (ref 150–400)
RBC: 4.62 MIL/uL (ref 4.22–5.81)
RDW: 15.1 % (ref 11.5–15.5)
WBC: 6.4 10*3/uL (ref 4.0–10.5)
nRBC: 0 % (ref 0.0–0.2)

## 2020-07-14 MED ORDER — FENTANYL CITRATE (PF) 100 MCG/2ML IJ SOLN
50.0000 ug | Freq: Once | INTRAMUSCULAR | Status: AC
Start: 2020-07-14 — End: 2020-07-14
  Administered 2020-07-14: 50 ug via INTRAVENOUS
  Filled 2020-07-14: qty 2

## 2020-07-14 MED ORDER — ISOSORBIDE MONONITRATE ER 60 MG PO TB24
30.0000 mg | ORAL_TABLET | Freq: Every day | ORAL | Status: DC
  Administered 2020-07-15 – 2020-07-17 (×3): 30 mg via ORAL
  Filled 2020-07-14 (×3): qty 1

## 2020-07-14 MED ORDER — GLIMEPIRIDE 2 MG PO TABS
2.0000 mg | ORAL_TABLET | Freq: Every day | ORAL | Status: DC
  Administered 2020-07-15 – 2020-07-17 (×3): 2 mg via ORAL
  Filled 2020-07-14 (×5): qty 1

## 2020-07-14 MED ORDER — BISACODYL 10 MG RE SUPP: 10.0000 mg | Freq: Every day | RECTAL | Status: DC | PRN

## 2020-07-14 MED ORDER — INSULIN ASPART 100 UNIT/ML ~~LOC~~ SOLN
0.0000 [IU] | Freq: Three times a day (TID) | SUBCUTANEOUS | Status: DC
  Administered 2020-07-14: 5 [IU] via SUBCUTANEOUS
  Administered 2020-07-15 (×3): 3 [IU] via SUBCUTANEOUS
  Administered 2020-07-16 – 2020-07-17 (×5): 5 [IU] via SUBCUTANEOUS

## 2020-07-14 MED ORDER — ACETAMINOPHEN 325 MG PO TABS: 650.0000 mg | ORAL_TABLET | Freq: Four times a day (QID) | ORAL | Status: DC | PRN

## 2020-07-14 MED ORDER — HEPARIN SODIUM (PORCINE) 5000 UNIT/ML IJ SOLN
5000.0000 [IU] | Freq: Three times a day (TID) | INTRAMUSCULAR | Status: DC
  Administered 2020-07-14 – 2020-07-17 (×10): 5000 [IU] via SUBCUTANEOUS
  Filled 2020-07-14 (×10): qty 1

## 2020-07-14 MED ORDER — SODIUM CHLORIDE 0.9 % IV SOLN: 250.0000 mL | INTRAVENOUS | Status: DC | PRN

## 2020-07-14 MED ORDER — LACTATED RINGERS IV BOLUS
1000.0000 mL | Freq: Once | INTRAVENOUS | Status: AC
  Administered 2020-07-14: 1000 mL via INTRAVENOUS

## 2020-07-14 MED ORDER — TRAZODONE HCL 50 MG PO TABS
50.0000 mg | ORAL_TABLET | Freq: Every evening | ORAL | Status: DC | PRN
Start: 2020-07-14 — End: 2020-07-17

## 2020-07-14 MED ORDER — AMLODIPINE BESYLATE 5 MG PO TABS
10.0000 mg | ORAL_TABLET | Freq: Every day | ORAL | Status: DC
  Administered 2020-07-14 – 2020-07-17 (×4): 10 mg via ORAL
  Filled 2020-07-14 (×5): qty 2

## 2020-07-14 MED ORDER — ASPIRIN EC 81 MG PO TBEC
81.0000 mg | DELAYED_RELEASE_TABLET | Freq: Every day | ORAL | Status: DC
  Administered 2020-07-14 – 2020-07-17 (×4): 81 mg via ORAL
  Filled 2020-07-14 (×4): qty 1

## 2020-07-14 MED ORDER — ACETAMINOPHEN 650 MG RE SUPP: 650.0000 mg | Freq: Four times a day (QID) | RECTAL | Status: DC | PRN

## 2020-07-14 MED ORDER — TRAMETINIB DIMETHYL SULFOXIDE 0.5 MG PO TABS
1.0000 mg | ORAL_TABLET | Freq: Every day | ORAL | Status: DC
  Administered 2020-07-14 – 2020-07-17 (×4): 1 mg via ORAL

## 2020-07-14 MED ORDER — INSULIN ASPART 100 UNIT/ML ~~LOC~~ SOLN
4.0000 [IU] | Freq: Three times a day (TID) | SUBCUTANEOUS | Status: DC
  Administered 2020-07-14 – 2020-07-17 (×9): 4 [IU] via SUBCUTANEOUS

## 2020-07-14 MED ORDER — HYDROMORPHONE HCL 1 MG/ML IJ SOLN
1.0000 mg | Freq: Once | INTRAMUSCULAR | Status: AC
  Administered 2020-07-14: 1 mg via INTRAVENOUS
  Filled 2020-07-14: qty 1

## 2020-07-14 MED ORDER — METHOCARBAMOL 500 MG PO TABS
500.0000 mg | ORAL_TABLET | Freq: Three times a day (TID) | ORAL | Status: DC
  Administered 2020-07-14 – 2020-07-17 (×9): 500 mg via ORAL
  Filled 2020-07-14 (×9): qty 1

## 2020-07-14 MED ORDER — POLYETHYLENE GLYCOL 3350 17 G PO PACK: 17.0000 g | PACK | Freq: Every day | ORAL | Status: DC | PRN

## 2020-07-14 MED ORDER — SODIUM CHLORIDE 0.9% FLUSH
3.0000 mL | Freq: Two times a day (BID) | INTRAVENOUS | Status: DC
  Administered 2020-07-14 – 2020-07-16 (×5): 3 mL via INTRAVENOUS

## 2020-07-14 MED ORDER — FENTANYL CITRATE (PF) 100 MCG/2ML IJ SOLN: 25.0000 ug | INTRAMUSCULAR | Status: DC | PRN

## 2020-07-14 MED ORDER — SODIUM CHLORIDE 0.9% FLUSH
3.0000 mL | Freq: Two times a day (BID) | INTRAVENOUS | Status: DC
  Administered 2020-07-14 – 2020-07-17 (×7): 3 mL via INTRAVENOUS

## 2020-07-14 MED ORDER — METOPROLOL TARTRATE 50 MG PO TABS
100.0000 mg | ORAL_TABLET | Freq: Two times a day (BID) | ORAL | Status: DC
Start: 2020-07-14 — End: 2020-07-17
  Administered 2020-07-14 – 2020-07-17 (×7): 100 mg via ORAL
  Filled 2020-07-14 (×8): qty 2

## 2020-07-14 MED ORDER — INSULIN ASPART 100 UNIT/ML ~~LOC~~ SOLN
0.0000 [IU] | Freq: Every day | SUBCUTANEOUS | Status: DC
  Administered 2020-07-16: 2 [IU] via SUBCUTANEOUS

## 2020-07-14 MED ORDER — ESCITALOPRAM OXALATE 10 MG PO TABS
10.0000 mg | ORAL_TABLET | Freq: Every day | ORAL | Status: DC
  Administered 2020-07-14 – 2020-07-17 (×4): 10 mg via ORAL
  Filled 2020-07-14 (×5): qty 1

## 2020-07-14 MED ORDER — ADULT MULTIVITAMIN W/MINERALS CH
1.0000 | ORAL_TABLET | Freq: Every day | ORAL | Status: DC
  Administered 2020-07-14 – 2020-07-17 (×4): 1 via ORAL
  Filled 2020-07-14 (×4): qty 1

## 2020-07-14 MED ORDER — DABRAFENIB MESYLATE 50 MG PO CAPS
100.0000 mg | ORAL_CAPSULE | Freq: Two times a day (BID) | ORAL | Status: DC
  Administered 2020-07-14 – 2020-07-17 (×6): 100 mg via ORAL
  Filled 2020-07-14 (×2): qty 2

## 2020-07-14 MED ORDER — OXYCODONE HCL 5 MG PO TABS
5.0000 mg | ORAL_TABLET | ORAL | Status: DC | PRN
  Administered 2020-07-14 – 2020-07-16 (×3): 5 mg via ORAL
  Filled 2020-07-14 (×3): qty 1

## 2020-07-14 MED ORDER — SODIUM CHLORIDE 0.9% FLUSH: 3.0000 mL | INTRAVENOUS | Status: DC | PRN

## 2020-07-14 MED ORDER — ONDANSETRON HCL 4 MG PO TABS
4.0000 mg | ORAL_TABLET | Freq: Four times a day (QID) | ORAL | Status: DC | PRN
  Administered 2020-07-14: 4 mg via ORAL
  Filled 2020-07-14: qty 1

## 2020-07-14 MED ORDER — ONDANSETRON HCL 4 MG/2ML IJ SOLN: 4.0000 mg | Freq: Four times a day (QID) | INTRAMUSCULAR | Status: DC | PRN

## 2020-07-14 NOTE — ED Triage Notes (Signed)
Pt had trip and fall this morning at home. Pt fell on left side and laid there for approx 3 hours. Pt c/o left hip pain.

## 2020-07-14 NOTE — ED Provider Notes (Signed)
Received patient in signout, please refer to previous note for full HPI.  Briefly this is an 80 year old male who had a mechanical fall down onto his left hip, complaining of severe left hip pain.  Pending lab evaluation and x-ray. Physical Exam  BP (!) 163/60   Pulse 65   Temp 98.3 F (36.8 C) (Oral)   Resp (!) 24   Ht 6\' 3"  (1.905 m)   Wt 82.5 kg   SpO2 99%   BMI 22.73 kg/m   Physical Exam Vitals and nursing note reviewed.  Constitutional:      Appearance: Normal appearance.  HENT:     Head: Normocephalic.     Mouth/Throat:     Mouth: Mucous membranes are moist.  Cardiovascular:     Rate and Rhythm: Normal rate.  Pulmonary:     Effort: Pulmonary effort is normal. No respiratory distress.  Abdominal:     Palpations: Abdomen is soft.     Tenderness: There is no abdominal tenderness.  Musculoskeletal:     Comments: Significant tenderness to palpation of the left hip area, pain with any passive or active range of motion, pelvis feels stable  Skin:    General: Skin is warm.  Neurological:     Mental Status: He is alert and oriented to person, place, and time. Mental status is at baseline.  Psychiatric:        Mood and Affect: Mood normal.     ED Course/Procedures     Procedures  MDM  Lab work appears baseline, x-ray identifies a left greater trochanter fracture.  After pain medicine the patient is improved but with any movement or even try to sit up in the bed he is in excruciating pain, would not be able to ambulate or maneuver crutches to be nonweightbearing.  Plan for admission for pain control, patients evaluation and results requires admission for further treatment and care. Patient agrees with admission plan, offers no new complaints and is stable/unchanged at time of admit.       Lorelle Gibbs, DO 07/14/20 1033

## 2020-07-14 NOTE — ED Notes (Signed)
Given given urinal for ua sample.

## 2020-07-14 NOTE — ED Provider Notes (Signed)
[facility] EMERGENCY DEPARTMENT Provider Note   CSN: [id number] Arrival date & time: 07/14/20  0549     History Chief Complaint  Patient presents with   Lytle Michaels    [name] is a 80 y.o. male.  Patient had fall.  He states he has had vomiting diarrhea for the last 24 to 36 hours.  Yesterday get over it but had an episode of incontinence in his pants.  He was standing there shown a change his underwear and was on one leg.  He states he thinks he lost his balance and fell.  He landed on his left side hurting his left hip and could not get up secondary to the same.  Laid there for approximately 2 hours before his wife finally found him.  No pain elsewhere.  No chest pain, abdominal pain or headache.  Did not hit his head. Did not pass out.    Fall This is a new problem. The current episode started 3 to 5 hours ago. The problem occurs constantly. The problem has not changed since onset.Pertinent negatives include no chest pain, no abdominal pain, no headaches and no shortness of breath. Nothing aggravates the symptoms. Nothing relieves the symptoms. He has tried nothing for the symptoms. The treatment provided no relief.       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   LIMA to LAD, SVG to D1, SVG to OM2, SVG to RCA, open vein harvest right thigh and lower leg   Spontaneous pneumothorax 09/15/2010   right    Patient Active Problem List   Diagnosis Date Noted   Chest pain 12/27/2017   Adenocarcinoma of lung, stage 4 ([place]) 09/08/2017   Abnormal CT of the abdomen 09/05/2017   CKD (chronic kidney disease) stage 3, GFR 30-59 ml/min ([place]) 06/20/2017   Type 2 diabetes mellitus without complication, without long-term current use of insulin ([place]) 11/04/2008   Coronary atherosclerosis 11/04/2008   Angina pectoris ([place]) 11/04/2008   S/P CABG x  4 09/22/2001    Past Surgical History:  Procedure Laterality Date   ANKLE FRACTURE SURGERY  2008   [facility]   APPENDECTOMY  [phone]   CHEST TUBE INSERTION Right 09/15/2010   Dr Servando Snare - spontaneous PTX   CHOLECYSTECTOMY  2008   COLONOSCOPY N/A 09/07/2017   Procedure: COLONOSCOPY;  Surgeon: Daneil Dolin, MD;  Location: [facility] ENDO SU[facility];  Service: Endoscopy;  Laterality: N/A;  10:30am   CORONARY ARTERY BYPASS GRAFT  2003   EYE SURGERY  2001   Cataracts removed bilaterally   INCISIONAL HERNIA REPAIR N/A 01/29/2015   Procedure: Fatima Blank HERNIORRHAPHY WITH MESH;  Surgeon: Aviva Signs, MD;  Location: [facility] ORS;  Service: General;  Laterality: N/A;   INSERTION OF MESH N/A 01/29/2015   Procedure: INSERTION OF MESH;  Surgeon: Aviva Signs, MD;  Location: [facility] ORS;  Service: General;  Laterality: N/A;   [place] CATH AND CORS/GRAFTS ANGIOGRAPHY N/A 06/20/2017   Procedure: LEFT HEART CATH AND CORS/GRAFTS ANGIOGRAPHY;  Surgeon: Martinique, Peter M, MD;  Location: [place] CV LAB;  Service: Cardiovascular;  Laterality: N/A;   ORIF ANKLE FRACTURE  08/26/2011   Procedure: OPEN REDUCTION INTERNAL FIXATION (ORIF) ANKLE FRACTURE;  Surgeon: Sanjuana Kava, MD;  Location:  [facility] ORS;  Service: Orthopedics;  Laterality: Right;   POLYPECTOMY  09/07/2017   Procedure: POLYPECTOMY;  Surgeon: Daneil Dolin, MD;  Location: [facility] END[facility];  Service: Endoscopy;;  ascending x2 (cold snare)   PROSTATE SURGERY  2012   Enlarged prostate   TRANSURETHRAL RESECTION OF PROSTATE  2012       Family History  Problem Relation Age of Onset   Aneurysm Mother    Heart attack Father    Colon cancer Maternal Uncle    Cancer Brother    Gastric cancer Neg Hx    Esophageal cancer Neg Hx     Social History   Tobacco Use   Smoking status: Former Smoker    Packs/day: 1.00    Years: 33.00    Pack years: 33.00    Quit date:  05/17/1987    Years since quitting: 33.1   Smokeless tobacco: Never Used  Vaping Use   Vaping Use: Never used  Substance Use Topics   Alcohol use: No   Drug use: No    Home Medications Prior to Admission medications   Medication Sig Start Date End Date Taking? Authorizing Provider  amLODipine (NORVASC) 10 MG tablet Take 1 tablet (10 mg total) by mouth daily. 07/09/20   Ahmed Prima, Fransisco Hertz, PA-C  aspirin EC 81 MG tablet Take 81 mg by mouth daily.    [provider]  Cholecalciferol (VITAMIN D3) 5000 units CAPS Take 5,000 Units by mouth daily.    [provider]  dabrafenib mesylate (TAFINLAR) 50 MG capsule TAKE 2 CAPSULES (100 MG TOTAL) BY MOUTH 2 (TWO) TIMES DAILY. TAKE ON AN EMPTY STOMACH 1 HOUR BEFORE OR 2 HOURS AFTER MEALS. 05/19/20   Derek Jack, MD  escitalopram (LEXAPRO) 10 MG tablet Take 10 mg by mouth daily.  04/03/18   [provider]  glimepiride (AMARYL) 2 MG tablet Take 2 mg by mouth daily with breakfast.     [provider]  isosorbide mononitrate (IMDUR) 30 MG 24 hr tablet TAKE ONE TABLET (30MG  TOTAL) BY MOUTH DAILY 01/09/20   Ahmed Prima, [place] M, PA-C  metFORMIN (GLUCOPHAGE) 500 MG tablet Take 1 tablet (500 mg total) by mouth 2 (two) times daily with a meal. 06/23/17   Martinique, Peter M, MD  metoprolol (LOPRESSOR) 100 MG tablet Take 100 mg by mouth 2 (two) times daily.    [provider]  nitroGLYCERIN (NITROSTAT) 0.4 MG SL tablet Place 0.4 mg under the tongue every 5 (five) minutes as needed for chest pain.  06/01/17   [provider]  ondansetron (ZOFRAN) 8 MG tablet Take 1 tablet (8 mg total) by mouth every 8 (eight) hours as needed for nausea or vomiting. 09/27/17   Derek Jack, MD  ONE TOUCH ULTRA TEST test strip  04/17/18   [provider]  pioglitazone (ACTOS) 45 MG tablet Take 45 mg by mouth daily.    [provider]  prochlorperazine (COMPAZINE) 10 MG tablet Take 1 tablet (10 mg total)  by mouth every 6 (six) hours as needed for nausea or vomiting. 09/27/17   Derek Jack, MD  sitaGLIPtin (JANUVIA) 50 MG tablet Take 50 mg by mouth daily.     [provider]  trametinib dimethyl sulfoxide (MEKINIST) 0.5 MG tablet TAKE 2 TABLETS (1 MG TOTAL) BY MOUTH DAILY. TAKE 1 HOUR BEFORE OR 2 HOURS AFTER A MEAL. STORE REFRIGERATED IN ORIGINAL CONTAINER. 05/15/20   Derek Jack, MD    Allergies    Patient has no known allergies.  Review of Systems   Review of Systems  Respiratory: Negative for shortness of breath.   Cardiovascular: Negative for chest pain.  Gastrointestinal: Negative for abdominal pain.  Neurological: Negative for headaches.  All other systems reviewed and are negative.   Physical Exam Updated Vital Signs BP (!) 155/75    Pulse 73    Temp 98.3 F (36.8 C) (Oral)    Resp (!) 27    Ht 6\' 3"  (1.905 m)    Wt 82.5 kg    SpO2 99%    BMI 22.73 kg/m   Physical Exam Vitals and nursing note reviewed.  Constitutional:      Appearance: He is well-developed and well-nourished.  HENT:     Head: Normocephalic and atraumatic.     Mouth/Throat:     Mouth: Mucous membranes are moist.     Pharynx: Oropharynx is clear.  Eyes:     Pupils: Pupils are equal, round, and reactive to light.  Cardiovascular:     Rate and Rhythm: Normal rate.  Pulmonary:     Effort: Pulmonary effort is normal. No respiratory distress.  Abdominal:     General: Abdomen is flat. There is no distension.  Musculoskeletal:        General: Tenderness present. Normal range of motion.     Cervical back: Normal range of motion.     Comments: With range of motion of his left hip.  He also has tenderness to palpation of his left lateral hip.  Skin:    General: Skin is warm and dry.     Comments: Small skin tear to right elbow  Neurological:     General: No focal deficit present.     Mental Status: He is alert.     ED Results / Procedures / Treatments   Labs (all labs ordered  are listed, but only abnormal results are displayed) Labs Reviewed  COMPREHENSIVE METABOLIC PANEL - Abnormal; Notable for the following components:      Result Value   Sodium 133 (*)    Chloride 97 (*)    Glucose, Bld 291 (*)    BUN 25 (*)    Creatinine, Ser 1.72 (*)    GFR, Estimated 40 (*)    All other components within normal limits  CK - Abnormal; Notable for the following components:   Total CK 44 (*)    All other components within normal limits  CBC WITH DIFFERENTIAL/PLATELET  URINALYSIS, ROUTINE W REFLEX MICROSCOPIC    EKG EKG Interpretation  Date/Time:  Monday July 14 2020 05:53:41 EST Ventricular Rate:  82 PR Interval:    QRS Duration: 92 QT Interval:  341 QTC Calculation: 399 R Axis:   28 Text Interpretation: Sinus arrhythmia Ventricular premature complex Borderline low voltage, extremity leads Repol abnrm suggests ischemia, diffuse leads Baseline wander in lead(s) III aVF Confirmed by Merrily Pew [phone]) on 07/14/2020 6:39:03 AM   Radiology No results found.  Procedures Procedures   Medications Ordered in ED Medications - No data to display  ED Course  I have reviewed the triage vital signs and the nursing notes.  Pertinent labs & imaging results that were available during my care of the patient were reviewed by me and considered in my medical decision making (see chart for details).    MDM Rules/Calculators/A&P                          EVAL FOR traumatic injury. dispo pending same.  Care transferred pending xray results, urine and attempt at walking with disposition pending same.  Final Clinical Impression(s) / ED Diagnoses Final diagnoses:  Fall    Rx / DC Orders ED Discharge Orders    None       Tariyah Pendry, Corene Cornea, MD 07/14/20 [phone]

## 2020-07-14 NOTE — ED Notes (Signed)
Skin tear noted to right elbow.  Denies pain.

## 2020-07-14 NOTE — H&P (Signed)
Patient Demographics:    [name], is a 80 y.o. male  MRN: [id number]   DOB - 1940/11/15  Admit Date - 07/14/2020  Outpatient Primary MD for the patient is [name], MD   Assessment & Plan:    Principal Problem:   Trochanteric avulsion fracture of femur [facility]) Active Problems:   Type 2 diabetes mellitus without complication, without long-term current use of insulin (HCC)   CKD (chronic kidney disease) stage 3, GFR 30-59 ml/min (HCC)   S/P CABG x 4   Adenocarcinoma of lung, stage 4 (HCC)    1)Lt Hip Pain--x-rays of the hip demonstrates acute nondisplaced fracture involving the greater trochanter of the left femur-- --attempted pain control in the ED failed -Place in obs for IV opiates -Patient with CKD 3B avoid excessive NSAID use -Get physical therapy eval  2) Metastatic adenocarcinoma of the lung, 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 -Monitor hemodynamics  4)Hypertension: --Resume home isosorbide 30 mg daily, metoprolol 100 mg twice daily, and amlodipine 10 mg daily  5) DM2-no recent A1c -Continue Amaryl with breakfast -Hold Metformin, Actos and Januvia. Use Novolog/Humalog Sliding scale insulin with Accu-Cheks/Fingersticks as ordered   6)CKD 3B--- renal function appears to be at baseline with a creatinine of 1.7 currently -- avoid excessive NSAID use renally adjust medications, avoid nephrotoxic agents / dehydration  / hypotension   Disposition/Need for in-Hospital Stay- patient unable to be discharged at this time due to --left hip  pain requiring IV pain medication  Dispo: The patient is from: Home              Anticipated d/c is to: Home              Anticipated d/c date is: 1 day              Patient currently is not medically stable to d/c. Barriers: Not Clinically Stable-    With History of - Reviewed by me  Past Medical History:  Diagnosis Date  . Cancer ([place])   . Chronic kidney disease   . Coronary artery disease   . Diabetes mellitus   . Hypertension   . 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 Spontaneous pneumothorax 09/15/2010   right      Past Surgical History:  Procedure Laterality Date  . ANKLE FRACTURE SURGERY  2008   [facility]  . APPENDECTOMY  1960's  . CHEST TUBE INSERTION Right 09/15/2010   Dr Servando Snare - spontaneous PTX  . CHOLECYSTECTOMY  2008  . COLONOSCOPY N/A 09/07/2017  Procedure: COLONOSCOPY;  Surgeon: Daneil Dolin, MD;  Location: [facility] ENDO [facility];  Service: Endoscopy;  Laterality: N/A;  10:30am  . CORONARY ARTERY BYPASS GRAFT  2003  . EYE SURGERY  2001   Cataracts removed bilaterally  . INCISIONAL HERNIA REPAIR N/A 01/29/2015   Procedure: Fatima Blank HERNIORRHAPHY WITH MESH;  Surgeon: Aviva Signs, MD;  Location: [facility] ORS;  Service: General;  Laterality: N/A;  . INSERTION OF MESH N/A 01/29/2015   Procedure: INSERTION OF MESH;  Surgeon: Aviva Signs, MD;  Location: [facility] ORS;  Service: General;  Laterality: N/A;  . [place]  . LEFT HEART CATH AND CORS/GRAFTS ANGIOGRAPHY N/A 06/20/2017   Procedure: LEFT HEART CATH AND CORS/GRAFTS ANGIOGRAPHY;  Surgeon: Martinique, Peter M, MD;  Location: [place] CV LAB;  Service: Cardiovascular;  Laterality: N/A;  . ORIF ANKLE FRACTURE  08/26/2011   Procedure: OPEN REDUCTION INTERNAL FIXATION (ORIF) ANKLE FRACTURE;  Surgeon: Sanjuana Kava, MD;  Location: [facility] ORS;  Service: Orthopedics;  Laterality: Right;  .  POLYPECTOMY  09/07/2017   Procedure: POLYPECTOMY;  Surgeon: Daneil Dolin, MD;  Location: [facility];  Service: Endoscopy;;  ascending x2 (cold snare)  . PROSTATE SURGERY  2012   Enlarged prostate  . TRANSURETHRAL RESECTION OF PROSTATE  2012    Chief Complaint  Patient presents with  . Fall      HPI:    [name]  is a 80 y.o. male with stage IV metastatic lung cancer, DM2, HTN, CKD 3B, presents to the ED with left hip pain after falling at home -Denies chest pains palpitations dizziness or pleuritic symptoms before or after falling -Denies seizure type activity, incontinence, loss of consciousness or confusion after or before falling - In the ED-x-rays of the hip demonstrates acute nondisplaced fracture involving the greater trochanter of the left femur-- -creatinine is 1.7 which is close to patient's baseline -EDP attempted to manage patient's pain however became too difficult to manage requested obs for pain control No fever  Or chills   No Nausea, Vomiting or Diarrhea -  Review of systems:    In addition to the HPI above,   A full Review of  Systems was done, all other systems reviewed are negative except as noted above in HPI , .   Social History:  Reviewed by me    Social History   Tobacco Use  . Smoking status: Former Smoker    Packs/day: 1.00    Years: 33.00    Pack years: 33.00    Quit date: 05/17/1987    Years since quitting: 33.1  . Smokeless tobacco: Never Used  Substance Use Topics  . Alcohol use: No    Family History :  Reviewed by me    Family History  Problem Relation Age of Onset  . Aneurysm Mother   . Heart attack Father   . Colon cancer Maternal Uncle   . Cancer Brother   . Gastric cancer Neg Hx   . Esophageal cancer Neg Hx     Home Medications:   Prior to Admission medications   Medication Sig Start Date End Date Taking? Authorizing Provider  amLODipine (NORVASC) 10 MG tablet Take 1 tablet (10 mg total) by mouth daily. 07/09/20   Yes Strader, Fransisco Hertz, PA-C  aspirin EC 81 MG tablet Take 81 mg by mouth daily.   Yes [provider]  Cholecalciferol (VITAMIN D3) 5000 units CAPS Take 5,000 Units by mouth daily.   Yes [provider]  dabrafenib mesylate (TAFINLAR) 50 MG capsule TAKE 2 CAPSULES (100 MG TOTAL) BY MOUTH 2 (TWO) TIMES DAILY. TAKE ON AN EMPTY STOMACH 1 HOUR BEFORE OR 2 HOURS AFTER MEALS. Patient taking differently: Take 100 mg by mouth 2 (two) times daily. TAKE ON AN EMPTY STOMACH 1 HOUR BEFORE OR 2 HOURS AFTER MEALS. 05/19/20  Yes Derek Jack, MD  escitalopram (LEXAPRO) 10 MG tablet Take 10 mg by mouth daily.  04/03/18  Yes [provider]  glimepiride (AMARYL) 2 MG tablet Take 2 mg by mouth daily with breakfast.    Yes [provider]  isosorbide mononitrate (IMDUR) 30 MG 24 hr tablet TAKE ONE TABLET (30MG TOTAL) BY MOUTH DAILY Patient taking differently: Take 30 mg by mouth daily. 01/09/20  Yes Strader, [place] M, PA-C  metFORMIN (GLUCOPHAGE) 500 MG tablet Take 1 tablet (500 mg total) by mouth 2 (two) times daily with a meal. 06/23/17  Yes Martinique, Peter M, MD  metoprolol (LOPRESSOR) 100 MG tablet Take 100 mg by mouth 2 (two) times daily.   Yes [provider]  pioglitazone (ACTOS) 45 MG tablet Take 45 mg by mouth daily.   Yes [provider]  trametinib dimethyl sulfoxide (MEKINIST) 0.5 MG tablet TAKE 2 TABLETS (1 MG TOTAL) BY MOUTH DAILY. TAKE 1 HOUR BEFORE OR 2 HOURS AFTER A MEAL. STORE REFRIGERATED IN ORIGINAL CONTAINER. Patient taking differently: Take 1 mg by mouth daily. TAKE 1 HOUR BEFORE OR 2 HOURS AFTER A MEAL. STORE REFRIGERATED IN ORIGINAL CONTAINER. 05/15/20  Yes Derek Jack, MD  nitroGLYCERIN (NITROSTAT) 0.4 MG SL tablet Place 0.4 mg under the tongue every 5 (five) minutes as needed for chest pain.  06/01/17   [provider]  ondansetron (ZOFRAN) 8 MG tablet Take 1 tablet (8 mg total) by mouth every 8 (eight) hours as needed  for nausea or vomiting. Patient not taking: Reported on 07/14/2020 09/27/17   Derek Jack, MD  ONE TOUCH ULTRA TEST test strip  04/17/18   [provider]  prochlorperazine (COMPAZINE) 10 MG tablet Take 1 tablet (10 mg total) by mouth every 6 (six) hours as needed for nausea or vomiting. Patient not taking: Reported on 07/14/2020 09/27/17   Derek Jack, MD     Allergies:    No Known Allergies   Physical Exam:   Vitals  Blood pressure (!) 185/88, pulse 74, temperature 98.3 F (36.8 C), temperature source Oral, resp. rate 20, height _0  (1.905 m), weight 83.2 kg, SpO2 100 %.  Physical Examination: General appearance - alert, well appearing, and in no distress  Mental status - alert, oriented to person, place, and time,  Eyes - sclera anicteric Neck - supple, no JVD elevation , Chest - clear  to auscultation bilaterally, symmetrical air movement,  Heart - S1 and S2 normal, regular  Abdomen - soft, nontender, nondistended, no masses or organomegaly Neurological - screening mental status exam normal, neck supple without rigidity, cranial nerves II through XII intact, DTR's normal and symmetric Extremities - no pedal edema noted, intact peripheral pulses  Skin - warm, dry MSK--point tenderness over the left trochanteric area     Data Review:    CBC Recent Labs  Lab 07/14/20 0600  WBC 6.4  HGB 13.4  HCT 39.0  PLT 113*  MCV 84.4  MCH 29.0  MCHC 34.4  RDW 15.1  LYMPHSABS 0.3*  MONOABS 0.5  EOSABS 0.0  BASOSABS 0.0   ------------------------------------------------------------------------------------------------------------------  Chemistries  Recent Labs  Lab 07/14/20 0600  NA 133*  K 3.9  CL 97*  CO2 24  GLUCOSE 291*  BUN 25*  CREATININE 1.72*  CALCIUM 9.1  AST 23  ALT 17  ALKPHOS 73  BILITOT 0.9   ------------------------------------------------------------------------------------------------------------------ estimated  creatinine clearance is 40.3 mL/min (A) (by C-G formula based on SCr of 1.72 mg/dL (H)). ------------------------------------------------------------------------------------------------------------------ No results for input(s): TSH, T4TOTAL, T3FREE, THYROIDAB in the last 72 hours.  Invalid input(s): FREET3   Coagulation profile No results for input(s): INR, PROTIME in the last 168 hours. ------------------------------------------------------------------------------------------------------------------- No results for input(s): DDIMER in the last 72 hours. -------------------------------------------------------------------------------------------------------------------  Cardiac Enzymes No results for input(s): CKMB, TROPONINI, MYOGLOBIN in the last 168 hours.  Invalid input(s): CK ------------------------------------------------------------------------------------------------------------------ No results found for: BNP   ---------------------------------------------------------------------------------------------------------------  Urinalysis    Component Value Date/Time   COLORURINE YELLOW 05/05/2007 [place] 05/05/2007 1658   LABSPEC 1.015 05/05/2007 1658   PHURINE 5.5 05/05/2007 1658   GLUCOSEU 100 (A) 05/05/2007 1658   HGBUR NEGATIVE 05/05/2007 1658   BILIRUBINUR NEGATIVE 05/05/2007 1658   KETONESUR NEGATIVE 05/05/2007 1658   PROTEINUR NEGATIVE 05/05/2007 1658   UROBILINOGEN 0.2 05/05/2007 1658   NITRITE NEGATIVE 05/05/2007 1658   LEUKOCYTESUR  05/05/2007 1658    NEGATIVE MICROSCOPIC NOT DONE ON URINES WITH NEGATIVE PROTEIN, BLOOD, LEUKOCYTES, NITRITE, OR GLUCOSE <1000 mg/dL.    ----------------------------------------------------------------------------------------------------------------   Imaging Results:    DG Chest 1 View  Result Date: 07/14/2020 CLINICAL DATA:  Slip and fall this morning. EXAM: CHEST  1 VIEW COMPARISON:  Chest CT-03/11/2020  FINDINGS: Grossly unchanged enlarged cardiac silhouette and mediastinal contours with atherosclerotic plaque within the thoracic aorta. Post median sternotomy and CABG. Redemonstrated extensive bilateral basilar predominant pulmonary nodules as better demonstrated on chest CT performed 03/11/2020. Minimal prior hilar heterogeneous opacities are unchanged favored to represent atelectasis. No new focal airspace opacities. No pleural effusion or pneumothorax. No evidence of edema. No acute osseous abnormalities. IMPRESSION: 1. No acute cardiopulmonary disease. 2. Redemonstrated extensive bilateral basilar predominant pulmonary nodules as better demonstrated on chest CT performed 03/11/2020. Electronically Signed   By: Sandi Mariscal M.D.   On: 07/14/2020 07:34   DG Hip Unilat W or Wo Pelvis 2-3 Views Left  Result Date: 07/14/2020 CLINICAL DATA:  Slip and fall this morning, now with left hip pain. EXAM: DG HIP (WITH OR WITHOUT PELVIS) 2-3V LEFT COMPARISON:  CT abdomen and pelvis 05/21/2019 FINDINGS: There is a suspected acute nondisplaced fracture involving the greater trochanter the femur. No intra-articular extension. Left hip joint spaces appear preserved. No additional fractures are identified. Limited visualization of the pelvis and contralateral left hip is normal. Seminal vessel and vascular calcifications overlie the pelvis bilaterally. Punctate phleboliths overlie the lower pelvis. A bone island is seen within the inter trochanteric region of the right femur. No radiopaque foreign body. IMPRESSION: Suspected acute nondisplaced fracture involving the greater trochanter of the left femur. Electronically Signed   By: Sandi Mariscal M.D.   On: 07/14/2020 07:32    Radiological Exams on Admission: DG Chest 1 View  Result Date: 07/14/2020 CLINICAL DATA:  Slip and fall this morning. EXAM: CHEST  1 VIEW COMPARISON:  Chest CT-03/11/2020 FINDINGS: Grossly unchanged enlarged cardiac silhouette and mediastinal contours  with atherosclerotic plaque within the thoracic aorta. Post median sternotomy and CABG. Redemonstrated extensive bilateral basilar predominant pulmonary nodules as better demonstrated on chest CT performed 03/11/2020. Minimal prior hilar heterogeneous opacities are unchanged favored to represent atelectasis. No new focal airspace opacities. No pleural effusion or  pneumothorax. No evidence of edema. No acute osseous abnormalities. IMPRESSION: 1. No acute cardiopulmonary disease. 2. Redemonstrated extensive bilateral basilar predominant pulmonary nodules as better demonstrated on chest CT performed 03/11/2020. Electronically Signed   By: Sandi Mariscal M.D.   On: 07/14/2020 07:34   DG Hip Unilat W or Wo Pelvis 2-3 Views Left  Result Date: 07/14/2020 CLINICAL DATA:  Slip and fall this morning, now with left hip pain. EXAM: DG HIP (WITH OR WITHOUT PELVIS) 2-3V LEFT COMPARISON:  CT abdomen and pelvis 05/21/2019 FINDINGS: There is a suspected acute nondisplaced fracture involving the greater trochanter the femur. No intra-articular extension. Left hip joint spaces appear preserved. No additional fractures are identified. Limited visualization of the pelvis and contralateral left hip is normal. Seminal vessel and vascular calcifications overlie the pelvis bilaterally. Punctate phleboliths overlie the lower pelvis. A bone island is seen within the inter trochanteric region of the right femur. No radiopaque foreign body. IMPRESSION: Suspected acute nondisplaced fracture involving the greater trochanter of the left femur. Electronically Signed   By: Sandi Mariscal M.D.   On: 07/14/2020 07:32    DVT Prophylaxis -SCD  /heparin AM Labs Ordered, also please review Full Orders  Family Communication: Admission, patients condition and plan of care including tests being ordered have been discussed with the patient  who indicate understanding and agree with the plan   Code Status - Full Code  Likely DC to  Home after adequate  pain control  Condition   stable  Roxan Hockey M.D on 07/14/2020 at 4:56 PM Go to [email or web address] -  for contact info  [facility] - Office  [phone]

## 2020-07-15 DIAGNOSIS — R269 Unspecified abnormalities of gait and mobility: Secondary | ICD-10-CM | POA: Diagnosis present

## 2020-07-15 DIAGNOSIS — W010XXA Fall on same level from slipping, tripping and stumbling without subsequent striking against object, initial encounter: Secondary | ICD-10-CM | POA: Diagnosis present

## 2020-07-15 DIAGNOSIS — D696 Thrombocytopenia, unspecified: Secondary | ICD-10-CM | POA: Diagnosis present

## 2020-07-15 DIAGNOSIS — I129 Hypertensive chronic kidney disease with stage 1 through stage 4 chronic kidney disease, or unspecified chronic kidney disease: Secondary | ICD-10-CM | POA: Diagnosis present

## 2020-07-15 DIAGNOSIS — Z951 Presence of aortocoronary bypass graft: Secondary | ICD-10-CM | POA: Diagnosis not present

## 2020-07-15 DIAGNOSIS — Z20822 Contact with and (suspected) exposure to covid-19: Secondary | ICD-10-CM | POA: Diagnosis present

## 2020-07-15 DIAGNOSIS — D631 Anemia in chronic kidney disease: Secondary | ICD-10-CM | POA: Diagnosis present

## 2020-07-15 DIAGNOSIS — C3490 Malignant neoplasm of unspecified part of unspecified bronchus or lung: Secondary | ICD-10-CM | POA: Diagnosis present

## 2020-07-15 DIAGNOSIS — Z87891 Personal history of nicotine dependence: Secondary | ICD-10-CM | POA: Diagnosis not present

## 2020-07-15 DIAGNOSIS — Z7984 Long term (current) use of oral hypoglycemic drugs: Secondary | ICD-10-CM | POA: Diagnosis not present

## 2020-07-15 DIAGNOSIS — E1122 Type 2 diabetes mellitus with diabetic chronic kidney disease: Secondary | ICD-10-CM | POA: Diagnosis present

## 2020-07-15 DIAGNOSIS — E119 Type 2 diabetes mellitus without complications: Secondary | ICD-10-CM | POA: Diagnosis not present

## 2020-07-15 DIAGNOSIS — S72115A Nondisplaced fracture of greater trochanter of left femur, initial encounter for closed fracture: Secondary | ICD-10-CM | POA: Diagnosis present

## 2020-07-15 DIAGNOSIS — N401 Enlarged prostate with lower urinary tract symptoms: Secondary | ICD-10-CM | POA: Diagnosis present

## 2020-07-15 DIAGNOSIS — R338 Other retention of urine: Secondary | ICD-10-CM | POA: Diagnosis present

## 2020-07-15 DIAGNOSIS — S72102A Unspecified trochanteric fracture of left femur, initial encounter for closed fracture: Secondary | ICD-10-CM | POA: Diagnosis not present

## 2020-07-15 DIAGNOSIS — Y92009 Unspecified place in unspecified non-institutional (private) residence as the place of occurrence of the external cause: Secondary | ICD-10-CM | POA: Diagnosis not present

## 2020-07-15 DIAGNOSIS — N1832 Chronic kidney disease, stage 3b: Secondary | ICD-10-CM | POA: Diagnosis present

## 2020-07-15 DIAGNOSIS — I2510 Atherosclerotic heart disease of native coronary artery without angina pectoris: Secondary | ICD-10-CM | POA: Diagnosis present

## 2020-07-15 DIAGNOSIS — Z8249 Family history of ischemic heart disease and other diseases of the circulatory system: Secondary | ICD-10-CM | POA: Diagnosis not present

## 2020-07-15 DIAGNOSIS — Z7982 Long term (current) use of aspirin: Secondary | ICD-10-CM | POA: Diagnosis not present

## 2020-07-15 DIAGNOSIS — Z79899 Other long term (current) drug therapy: Secondary | ICD-10-CM | POA: Diagnosis not present

## 2020-07-15 LAB — URINALYSIS, ROUTINE W REFLEX MICROSCOPIC
Bacteria, UA: NONE SEEN
Bilirubin Urine: NEGATIVE
Glucose, UA: >=500 mg/dL — AB
Hgb urine dipstick: NEGATIVE
Ketones, ur: 5 mg/dL — AB
Leukocytes,Ua: NEGATIVE
Nitrite: NEGATIVE
Protein, ur: 100 mg/dL — AB
Renal Epithelial: 1
Specific Gravity, Urine: 1.02 (ref 1.005–1.030)
pH: 5 (ref 5.0–8.0)

## 2020-07-15 LAB — CBC
HCT: 33.2 % — ABNORMAL LOW (ref 39.0–52.0)
Hemoglobin: 11.2 g/dL — ABNORMAL LOW (ref 13.0–17.0)
MCH: 28.8 pg (ref 26.0–34.0)
MCHC: 33.7 g/dL (ref 30.0–36.0)
MCV: 85.3 fL (ref 80.0–100.0)
Platelets: 107 10*3/uL — ABNORMAL LOW (ref 150–400)
RBC: 3.89 MIL/uL — ABNORMAL LOW (ref 4.22–5.81)
RDW: 15.1 % (ref 11.5–15.5)
WBC: 4.9 10*3/uL (ref 4.0–10.5)
nRBC: 0 % (ref 0.0–0.2)

## 2020-07-15 LAB — GLUCOSE, CAPILLARY
Glucose-Capillary: 177 mg/dL — ABNORMAL HIGH (ref 70–99)
Glucose-Capillary: 182 mg/dL — ABNORMAL HIGH (ref 70–99)
Glucose-Capillary: 193 mg/dL — ABNORMAL HIGH (ref 70–99)
Glucose-Capillary: 163 mg/dL — ABNORMAL HIGH (ref 70–99)

## 2020-07-15 LAB — BASIC METABOLIC PANEL
Anion gap: 11 (ref 5–15)
BUN: 23 mg/dL (ref 8–23)
CO2: 25 mmol/L (ref 22–32)
Calcium: 8.5 mg/dL — ABNORMAL LOW (ref 8.9–10.3)
Chloride: 99 mmol/L (ref 98–111)
Creatinine, Ser: 1.43 mg/dL — ABNORMAL HIGH (ref 0.61–1.24)
GFR, Estimated: 50 mL/min — ABNORMAL LOW (ref >60)
Glucose, Bld: 156 mg/dL — ABNORMAL HIGH (ref 70–99)
Potassium: 3.8 mmol/L (ref 3.5–5.1)
Sodium: 135 mmol/L (ref 135–145)

## 2020-07-15 MED ORDER — LABETALOL HCL 5 MG/ML IV SOLN: 10.0000 mg | INTRAVENOUS | Status: DC | PRN

## 2020-07-15 MED ORDER — TAMSULOSIN HCL 0.4 MG PO CAPS
0.4000 mg | ORAL_CAPSULE | Freq: Every day | ORAL | Status: DC
  Administered 2020-07-15 – 2020-07-16 (×2): 0.4 mg via ORAL
  Filled 2020-07-15 (×2): qty 1

## 2020-07-15 MED ORDER — FENTANYL CITRATE (PF) 100 MCG/2ML IJ SOLN
25.0000 ug | Freq: Once | INTRAMUSCULAR | Status: AC
  Administered 2020-07-15: 25 ug via INTRAVENOUS
  Filled 2020-07-15: qty 2

## 2020-07-15 NOTE — Plan of Care (Signed)
  Problem: Acute Rehab PT Goals(only PT should resolve) Goal: Pt Will Go Supine/Side To Sit Outcome: Progressing Flowsheets (Taken 07/15/2020 1236) Pt will go Supine/Side to Sit: with minimal assist Goal: Patient Will Transfer Sit To/From Stand Outcome: Progressing Flowsheets (Taken 07/15/2020 1236) Patient will transfer sit to/from stand: with minimal assist Goal: Pt Will Transfer Bed To Chair/Chair To Bed Outcome: Progressing Flowsheets (Taken 07/15/2020 1236) Pt will Transfer Bed to Chair/Chair to Bed: with min assist Goal: Pt Will Ambulate Outcome: Progressing Flowsheets (Taken 07/15/2020 1236) Pt will Ambulate:  25 feet  with minimal assist  with moderate assist  with rolling walker   12:36 PM, 07/15/20 Lonell Grandchild, MPT Physical Therapist with [facility] 336 [phone] office [phone] mobile phone

## 2020-07-15 NOTE — TOC Initial Note (Signed)
Transition of Care [facility]) - Initial/Assessment Note   Patient Details  Name: [name] MRN: [id number] Date of Birth: March 28, 1941  [facility] CM/SW Contact:    Sherie Don, LCSW Phone Number: 07/15/2020, 1:10 PM  Clinical Narrative: Patient is an 80 year old male who is under observation for trochanteric avulsion fracture of femur. PT evaluation recommended SNF. Patient has been reviewed by the Appropriate Use Committee and is approved for SNF.  CSW spoke with patient's wife, [name], and she and the patient are agreeable to SNF. Patient has been vaccinated and boosted for COVID. FL2 completed; PASRR received. Initial referral faxed out. CSW called Bernadene Bell to start insurance authorization. Reference ID # is: [id number]. Clinicals faxed to [facility]. TOC awaiting bed offers.   Expected Discharge Plan: Skilled Nursing Facility Barriers to Discharge: Continued Medical Work up  Patient Goals and CMS Choice Patient states their goals for this hospitalization and ongoing recovery are:: Go to rehab CMS Medicare.gov Compare Post Acute Care list provided to:: Patient Represenative (must comment) Choice offered to / list presented to : [facility]  Expected Discharge Plan and Services Expected Discharge Plan: [place] In-house Referral: Clinical Social Work Post Acute Care Choice: [place] Living arrangements for the past 2 months: [place]              Prior Living Arrangements/Services Living arrangements for the past 2 months: Single Family Home Lives with:: Spouse Patient language and need for interpreter reviewed:: Yes Do you feel safe going back to the place where you live?: Yes      Need for Family Participation in Patient Care: Yes (Comment) Care giver support system in place?: Yes (comment) Criminal Activity/Legal Involvement Pertinent to Current Situation/Hospitalization: No - Comment as needed  Activities of  Daily Living Home Assistive Devices/Equipment: None ADL Screening (condition at time of admission) Patient's cognitive ability adequate to safely complete daily activities?: Yes Is the patient deaf or have difficulty hearing?: No Does the patient have difficulty seeing, even when wearing glasses/contacts?: No Does the patient have difficulty concentrating, remembering, or making decisions?: No Patient able to express need for assistance with ADLs?: Yes Does the patient have difficulty dressing or bathing?: No Independently performs ADLs?: Yes (appropriate for developmental age) Does the patient have difficulty walking or climbing stairs?: No Weakness of Legs: None Weakness of Arms/Hands: None  Permission Sought/Granted Permission sought to share information with : Facility Art therapist granted to share information with : Yes, Verbal Permission Granted Permission granted to share info w AGENCY: SNFs  Emotional Assessment Appearance:: Appears stated age Orientation: : Oriented to Self,Oriented to Place,Oriented to  Time,Oriented to Situation Alcohol / Substance Use: Not Applicable Psych Involvement: No (comment)  Admission diagnosis:  Fall [W19.XXXA] Trochanteric avulsion fracture of femur ([place]) [H70.263Z] Patient Active Problem List   Diagnosis Date Noted  . Trochanteric avulsion fracture of femur ([place]) 07/14/2020  . Chest pain 12/27/2017  . Adenocarcinoma of lung, stage 4 ([place]) 09/08/2017  . Abnormal CT of the abdomen 09/05/2017  . CKD (chronic kidney disease) stage 3, GFR 30-59 ml/min (HCC) 06/20/2017  . Type 2 diabetes mellitus without complication, without long-term current use of insulin ([place]) 11/04/2008  . Coronary atherosclerosis 11/04/2008  . Angina pectoris ([place]) 11/04/2008  . 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   PCP:  [name], MD Pharmacy:   [place], [place] [address] Phone: [phone]  Fax: [phone]  [facility], [place] [address] [place] [address] Phone: [phone] Fax: [phone]  Readmission Risk Interventions No flowsheet data found.

## 2020-07-15 NOTE — Progress Notes (Addendum)
Patient Demographics:    [name], is a 80 y.o. male, DOB - 16-Mar-1941, QQV:[id number]  Admit date - 07/14/2020   Admitting Physician Roxan Hockey, MD  Outpatient Primary MD for the patient is [name], MD  LOS - 0   Chief Complaint  Patient presents with  . Fall        Subjective:    [name] today has no fevers, no emesis,  No chest pain,   -Complains about 10 out of 10 left hip pain -Had oxycodone and methocarbamol earlier--- states pain only slightly improved after this -Patient states left hip pain is actually worse now after working with physical therapy -Wife at bedside, questions answered -Patient having difficulties with urination requiring in and out catheterization  Assessment  & Plan :    Principal Problem:   Trochanteric avulsion fracture of femur ([place]) Active Problems:   Type 2 diabetes mellitus without complication, without long-term current use of insulin (HCC)   CKD (chronic kidney disease) stage 3, GFR 30-59 ml/min (HCC)   S/P CABG x 4   Adenocarcinoma of lung, stage 4 ([place])  Brief Summary:- 79 y.o. male with stage IV metastatic lung cancer, DM2, HTN, CKD 3B, admitted 07/14/20 with left hip pain after falling at home and sustaining acute nondisplaced fracture involving the greater trochanter of the left femur-- -patient having difficulty with pain control and ambulation PT recommends SNF rehab  A/p 1)Lt Hip Pain--x-rays of the hip demonstrates acute nondisplaced fracture involving the greater trochanter of the left femur-- -Patient with CKD 3B avoid excessive NSAID use Complains about 10 out of 10 left hip pain -Had oxycodone and methocarbamol earlier--- states pain only slightly improved after this --Patient states left hip pain is actually worse now after working with physical therapy -Okay to use IV fentanyl prn   2) Metastatic adenocarcinoma of the lung,  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 -Monitor hemodynamics  4)Hypertension: -Continue isosorbide 30 mg daily, metoprolol 100 mg twice daily, and amlodipine 10 mg daily -IV labetalol as needed elevated BP  5) DM2-no recent A1c -Continue Amaryl with breakfast -Hold Metformin, Actos and Januvia. Use Novolog/Humalog Sliding scale insulin with Accu-Cheks/Fingersticks as ordered   6)CKD 3B--- renal function appears to be at baseline -  creatinine is down to 1.4 from  1.7 on admission -- avoid excessive NSAID use renally adjust medications, avoid nephrotoxic agents / dehydration  / hypotension  7) falls and ambulatory dysfunction--- PT eval appreciated ,  recommends SNF rehab ---PTA pt lived alone and did very poorly, patient has significant limitations with mobility related ADLs- this patient needs to continue to be monitored in the hospital until a SNF bed is obtained as she is not safe to go home with her current physcical limitations and increased fall risk  8) chronic anemia and acute thrombocytopenia--no obvious bleeding noted, hemoglobin 11.2 which is not far away from patient's baseline which is usually above 12 -Platelets are down to 107 monitor closely -May discontinue heparin for DVT  prophylaxis if platelets continue to drop  9) acute urinary retention --requiring in and out catheterization x2 -Start Flomax -May need indwelling Foley if fails   Disposition/Need for in-Hospital Stay- patient unable to be discharged at this time due to --left hip pain requiring IV pain medication -Ambulatory dysfunction and increased fall risk requiring SNF placement  Dispo: The patient is from: Home  Anticipated d/c is  to: SNF  Anticipated d/c date is: 1 day  Patient currently is not medically stable to d/c. Barriers: Not Clinically Stable-   Code Status : -  Code Status: Full Code   Family Communication:   (patient is alert, awake and coherent)  Discussed with wife at bedside Consults  :  na  DVT Prophylaxis  :   - SCDs   heparin injection 5,000 Units Start: 07/14/20 1400 SCDs Start: 07/14/20 1242 Place TED hose Start: 07/14/20 1242    Lab Results  Component Value Date   PLT 107 (L) 07/15/2020    Inpatient Medications  Scheduled Meds: . amLODipine  10 mg Oral Daily  . aspirin EC  81 mg Oral Daily  . dabrafenib mesylate  100 mg Oral BID  . escitalopram  10 mg Oral Daily  . glimepiride  2 mg Oral Q breakfast  . heparin  5,000 Units Subcutaneous Q8H  . insulin aspart  0-15 Units Subcutaneous TID WC  . insulin aspart  0-5 Units Subcutaneous QHS  . insulin aspart  4 Units Subcutaneous TID WC  . isosorbide mononitrate  30 mg Oral Daily  . methocarbamol  500 mg Oral TID  . metoprolol tartrate  100 mg Oral BID  . multivitamin with minerals  1 tablet Oral Daily  . sodium chloride flush  3 mL Intravenous Q12H  . sodium chloride flush  3 mL Intravenous Q12H  . trametinib dimethyl sulfoxide  1 mg Oral Daily   Continuous Infusions: . sodium chloride     PRN Meds:.sodium chloride, acetaminophen **OR** acetaminophen, bisacodyl, fentaNYL (SUBLIMAZE) injection, labetalol, ondansetron **OR** ondansetron (ZOFRAN) IV, oxyCODONE, polyethylene glycol, sodium chloride flush, traZODone    Anti-infectives (From admission, onward)   None        Objective:   Vitals:   07/14/20 1300 07/14/20 1400 07/14/20 2044 07/15/20 0510  BP: (!) 157/64 (!) 185/88 116/86 (!) 172/67  Pulse: 68 74 66 (!) 53  Resp: '17 20 18 18  ' Temp:  98.3 F (36.8 C) 98 F (36.7 C) 98.2 F (36.8 C)  TempSrc:  Oral    SpO2: 99% 100% 92% 96%  Weight:  83.2 kg    Height:  '6\' 3"'  (1.905 m)      Wt  Readings from Last 3 Encounters:  07/14/20 83.2 kg  06/12/20 87.3 kg  03/12/20 88.3 kg     Intake/Output Summary (Last 24 hours) at 07/15/2020 1234 Last data filed at 07/15/2020 0600 Gross per 24 hour  Intake 320 ml  Output 1000 ml  Net -680 ml     Physical Exam  Physical Examination: General appearance - alert, frail and uncomfortable in pain  mental status - alert, oriented to person, place, and time,  Eyes - sclera anicteric Neck - supple, no JVD elevation , Chest - clear  to auscultation bilaterally, symmetrical air movement,  Heart - S1 and S2 normal, regular  Abdomen - soft, nontender, nondistended, no masses or organomegaly Neurological - screening mental status exam normal, neck supple without rigidity, cranial nerves II through XII intact, DTR's normal and symmetric Extremities - no  pedal edema noted, intact peripheral pulses  Skin - warm, dry MSK--point tenderness over the left trochanteric area, limited range of motion due to pain   Data Review:   Micro Results Recent Results (from the past 240 hour(s))  Resp Panel by RT-PCR (Flu A&B, Covid) Nasopharyngeal Swab     Status: None   Collection Time: 07/14/20  8:17 AM   Specimen: Nasopharyngeal Swab; Nasopharyngeal(NP) swabs in vial transport medium  Result Value Ref Range Status   SARS Coronavirus 2 by RT PCR NEGATIVE NEGATIVE Final    Comment: (NOTE) SARS-CoV-2 target nucleic acids are NOT DETECTED.  The SARS-CoV-2 RNA is generally detectable in upper respiratory specimens during the acute phase of infection. The lowest concentration of SARS-CoV-2 viral copies this assay can detect is 138 copies/mL. A negative result does not preclude SARS-Cov-2 infection and should not be used as the sole basis for treatment or other patient management decisions. A negative result may occur with  improper specimen collection/handling, submission of specimen other than nasopharyngeal swab, presence of viral mutation(s) within  the areas targeted by this assay, and inadequate number of viral copies(<138 copies/mL). A negative result must be combined with clinical observations, patient history, and epidemiological information. The expected result is Negative.  Fact Sheet for Patients:  EntrepreneurPulse.com.au  Fact Sheet for Healthcare Providers:  IncredibleEmployment.be  This test is no t yet approved or cleared by the [place] FDA and  has been authorized for detection and/or diagnosis of SARS-CoV-2 by FDA under an Emergency Use Authorization (EUA). This EUA will remain  in effect (meaning this test can be used) for the duration of the COVID-19 declaration under Section 564(b)(1) of the Act, 21 U.S.C.section 360bbb-3(b)(1), unless the authorization is terminated  or revoked sooner.       Influenza A by PCR NEGATIVE NEGATIVE Final   Influenza B by PCR NEGATIVE NEGATIVE Final    Comment: (NOTE) The Xpert Xpress SARS-CoV-2/FLU/RSV plus assay is intended as an aid in the diagnosis of influenza from Nasopharyngeal swab specimens and should not be used as a sole basis for treatment. Nasal washings and aspirates are unacceptable for Xpert Xpress SARS-CoV-2/FLU/RSV testing.  Fact Sheet for Patients: EntrepreneurPulse.com.au  Fact Sheet for Healthcare Providers: IncredibleEmployment.be  This test is not yet approved or cleared by the [place] FDA and has been authorized for detection and/or diagnosis of SARS-CoV-2 by FDA under an Emergency Use Authorization (EUA). This EUA will remain in effect (meaning this test can be used) for the duration of the COVID-19 declaration under Section 564(b)(1) of the Act, 21 U.S.C. section 360bbb-3(b)(1), unless the authorization is terminated or revoked.  Performed at [facility], [address] [place], [place] [address]     Radiology Reports DG Chest 1 View  Result Date:  07/14/2020 CLINICAL DATA:  Slip and fall this morning. EXAM: CHEST  1 VIEW COMPARISON:  Chest CT-03/11/2020 FINDINGS: Grossly unchanged enlarged cardiac silhouette and mediastinal contours with atherosclerotic plaque within the thoracic aorta. Post median sternotomy and CABG. Redemonstrated extensive bilateral basilar predominant pulmonary nodules as better demonstrated on chest CT performed 03/11/2020. Minimal prior hilar heterogeneous opacities are unchanged favored to represent atelectasis. No new focal airspace opacities. No pleural effusion or pneumothorax. No evidence of edema. No acute osseous abnormalities. IMPRESSION: 1. No acute cardiopulmonary disease. 2. Redemonstrated extensive bilateral basilar predominant pulmonary nodules as better demonstrated on chest CT performed 03/11/2020. Electronically Signed   By: Sandi Mariscal M.D.   On: 07/14/2020 07:34   DG Hip  Unilat W or Wo Pelvis 2-3 Views Left  Result Date: 07/14/2020 CLINICAL DATA:  Slip and fall this morning, now with left hip pain. EXAM: DG HIP (WITH OR WITHOUT PELVIS) 2-3V LEFT COMPARISON:  CT abdomen and pelvis 05/21/2019 FINDINGS: There is a suspected acute nondisplaced fracture involving the greater trochanter the femur. No intra-articular extension. Left hip joint spaces appear preserved. No additional fractures are identified. Limited visualization of the pelvis and contralateral left hip is normal. Seminal vessel and vascular calcifications overlie the pelvis bilaterally. Punctate phleboliths overlie the lower pelvis. A bone island is seen within the inter trochanteric region of the right femur. No radiopaque foreign body. IMPRESSION: Suspected acute nondisplaced fracture involving the greater trochanter of the left femur. Electronically Signed   By: Sandi Mariscal M.D.   On: 07/14/2020 07:32     CBC Recent Labs  Lab 07/14/20 0600 07/15/20 0404  WBC 6.4 4.9  HGB 13.4 11.2*  HCT 39.0 33.2*  PLT 113* 107*  MCV 84.4 85.3  MCH 29.0  28.8  MCHC 34.4 33.7  RDW 15.1 15.1  LYMPHSABS 0.3*  --   MONOABS 0.5  --   EOSABS 0.0  --   BASOSABS 0.0  --     Chemistries  Recent Labs  Lab 07/14/20 0600 07/15/20 0404  NA 133* 135  K 3.9 3.8  CL 97* 99  CO2 24 25  GLUCOSE 291* 156*  BUN 25* 23  CREATININE 1.72* 1.43*  CALCIUM 9.1 8.5*  AST 23  --   ALT 17  --   ALKPHOS 73  --   BILITOT 0.9  --    ------------------------------------------------------------------------------------------------------------------ No results for input(s): CHOL, HDL, LDLCALC, TRIG, CHOLHDL, LDLDIRECT in the last 72 hours.  Lab Results  Component Value Date   HGBA1C (H) 09/09/2010    6.2 (NOTE)                                                                       According to the ADA Clinical Practice Recommendations for 2011, when HbA1c is used as a screening test:   >=6.5%   Diagnostic of Diabetes Mellitus           (if abnormal result  is confirmed)  5.7-6.4%   Increased risk of developing Diabetes Mellitus  References:Diagnosis and Classification of Diabetes Mellitus,Diabetes Care,2011,34(Suppl 1):S62-S69 and Standards of Medical Care in         Diabetes - 2011,Diabetes TMHD,6222,97  (Suppl 1):S11-S61.   ------------------------------------------------------------------------------------------------------------------ No results for input(s): TSH, T4TOTAL, T3FREE, THYROIDAB in the last 72 hours.  Invalid input(s): FREET3 ------------------------------------------------------------------------------------------------------------------ No results for input(s): VITAMINB12, FOLATE, FERRITIN, TIBC, IRON, RETICCTPCT in the last 72 hours.  Coagulation profile No results for input(s): INR, PROTIME in the last 168 hours.  No results for input(s): DDIMER in the last 72 hours.  Cardiac Enzymes No results for input(s): CKMB, TROPONINI, MYOGLOBIN in the last 168 hours.  Invalid input(s):  CK ------------------------------------------------------------------------------------------------------------------ No results found for: BNP   Roxan Hockey M.D on 07/15/2020 at 12:34 PM  Go to [email or web address] - for contact info  [facility] - Office  [phone]

## 2020-07-15 NOTE — Evaluation (Signed)
Physical Therapy Evaluation Patient Details Name: [name] MRN: [id number] DOB: 1941-01-25 Today's Date: 07/15/2020   History of Present Illness  [name]  is a 80 y.o. male with stage IV metastatic lung cancer, DM2, HTN, CKD 3B, presents to the ED with left hip pain after falling at home  -Denies chest pains palpitations dizziness or pleuritic symptoms before or after falling  -Denies seizure type activity, incontinence, loss of consciousness or confusion after or before falling  -  In the ED-x-rays of the hip demonstrates acute nondisplaced fracture involving the greater trochanter of the left femur--  -creatinine is 1.7 which is close to patient's baseline  -EDP attempted to manage patient's pain however became too difficult to manage requested obs for pain control    Clinical Impression  Patient demonstrates slow labored movement for sitting up at bedside requiring Mod assist to move LLE due to increasing hip pain, had difficulty completing sit to stands due to weakness and poor tolerance for weightbearing on LLE, slow labored cadence with excessive left ankle external rotation and frequent shuffling on left foot due to weakness/increased pain.  Patient tolerated sitting up in chair after therapy with his spouse present in room - RN notified.  Patient will benefit from continued physical therapy in hospital and recommended venue below to increase strength, balance, endurance for safe ADLs and gait.      Follow Up Recommendations SNF    Equipment Recommendations  Rolling walker with 5" wheels    Recommendations for Other Services       Precautions / Restrictions Precautions Precautions: Fall Restrictions Weight Bearing Restrictions: Yes LLE Weight Bearing: Weight bearing as tolerated      Mobility  Bed Mobility Overal bed mobility: Needs Assistance Bed Mobility: Supine to Sit     Supine to sit: Mod assist     General bed mobility comments: increased time, labored  movement    Transfers Overall transfer level: Needs assistance Equipment used: Rolling walker (2 wheeled) Transfers: Sit to/from [facility] Sit to Stand: Mod assist Stand pivot transfers: Mod assist       General transfer comment: slow labored movement, poor tolerance for weightbearing on LLE  Ambulation/Gait Ambulation/Gait assistance: Mod assist Gait Distance (Feet): 15 Feet Assistive device: Rolling walker (2 wheeled) Gait Pattern/deviations: Decreased step length - right;Decreased step length - left;Decreased stance time - left;Decreased stride length;Antalgic;Shuffle Gait velocity: decreased   General Gait Details: slow labored cadence with poor tolerance for weightbearing on LLE due to increased hip pain, excessive left ankle external rotation due ot old fractures, limited mostly due to pain  Stairs            Wheelchair Mobility    Modified Rankin (Stroke Patients Only)       Balance Overall balance assessment: Needs assistance Sitting-balance support: Feet supported;No upper extremity supported Sitting balance-Leahy Scale: Fair Sitting balance - Comments: seated at EOB   Standing balance support: During functional activity;Bilateral upper extremity supported Standing balance-Leahy Scale: Poor Standing balance comment: fair/poor using RW                             Pertinent Vitals/Pain Pain Assessment: Faces Faces Pain Scale: Hurts even more Pain Location: left hip Pain Descriptors / Indicators: Aching;Sore;Grimacing;Guarding Pain Intervention(s): Limited activity within patient's tolerance;Monitored during session;Premedicated before session;Repositioned    Home Living Family/patient expects to be discharged to:: Private residence Living Arrangements: Spouse/significant other Available Help at Discharge:  Family Type of Home: House Home Access: Stairs to enter Entrance Stairs-Rails: Right;Left;Can reach both Entrance  Stairs-Number of Steps: 5 Home Layout: One level Home Equipment: Cane - single point;Shower seat - built in      Prior Function Level of Independence: Independent         Comments: Hydrographic surveyor, drives     Journalist, newspaper   Dominant Hand: Right    Extremity/Trunk Assessment   Upper Extremity Assessment Upper Extremity Assessment: Generalized weakness    Lower Extremity Assessment Lower Extremity Assessment: Generalized weakness;LLE deficits/detail LLE Deficits / Details: grossly 3+/5 LLE: Unable to fully assess due to pain LLE Sensation: WNL LLE Coordination: WNL    Cervical / Trunk Assessment Cervical / Trunk Assessment: Normal  Communication   Communication: No difficulties  Cognition Arousal/Alertness: Awake/alert Behavior During Therapy: WFL for tasks assessed/performed Overall Cognitive Status: Within Functional Limits for tasks assessed                                        General Comments      Exercises     Assessment/Plan    PT Assessment Patient needs continued PT services  PT Problem List Decreased strength;Decreased activity tolerance;Decreased balance;Decreased mobility       PT Treatment Interventions DME instruction;Gait training;Stair training;Functional mobility training;Therapeutic activities;Therapeutic exercise;Patient/family education;Balance training    PT Goals (Current goals can be found in the Care Plan section)  Acute Rehab PT Goals Patient Stated Goal: return home after rehab PT Goal Formulation: With patient/family Time For Goal Achievement: 07/29/20 Potential to Achieve Goals: Good    Frequency Min 3X/week   Barriers to discharge        Co-evaluation               AM-PAC PT "6 Clicks" Mobility  Outcome Measure Help needed turning from your back to your side while in a flat bed without using bedrails?: A Lot Help needed moving from lying on your back to sitting on the side of a flat bed  without using bedrails?: A Lot Help needed moving to and from a bed to a chair (including a wheelchair)?: A Lot Help needed standing up from a chair using your arms (e.g., wheelchair or bedside chair)?: A Lot Help needed to walk in hospital room?: A Lot Help needed climbing 3-5 steps with a railing? : Total 6 Click Score: 11    End of Session   Activity Tolerance: Patient tolerated treatment well;Patient limited by fatigue Patient left: in chair;with call bell/phone within reach;with family/visitor present Nurse Communication: Mobility status PT Visit Diagnosis: Unsteadiness on feet (R26.81);Other abnormalities of gait and mobility (R26.89);Muscle weakness (generalized) (M62.81)    Time: 9767-3419 PT Time Calculation (min) (ACUTE ONLY): 28 min   Charges:   PT Evaluation $PT Eval Moderate Complexity: 1 Mod PT Treatments $Therapeutic Activity: 23-37 mins        12:33 PM, 07/15/20 Lonell Grandchild, MPT Physical Therapist with [facility] 336 [phone] office [phone] mobile phone

## 2020-07-15 NOTE — NC FL2 (Signed)
[place] LEVEL OF CARE SCREENING TOOL     IDENTIFICATION  Patient Name: [name] Birthdate: 1941-01-10 Sex: male Admission Date (Current Location): 07/14/2020  [facility] and [place] Number:  [employer] and Address:   [address] [place]      Provider Number: [id number]  Attending Physician Name and Address:  Roxan Hockey, MD  Relative Name and Phone Number:  Vaishnav Demartin (wife) Ph: [phone]    Current Level of Care: Hospital Recommended Level of Care: [place] Prior Approval Number:    Date Approved/Denied:   PASRR Number: [id number]  Discharge Plan: SNF    Current Diagnoses: Patient Active Problem List   Diagnosis Date Noted  . Trochanteric avulsion fracture of femur ([place]) 07/14/2020  . Chest pain 12/27/2017  . Adenocarcinoma of lung, stage 4 ([place]) 09/08/2017  . Abnormal CT of the abdomen 09/05/2017  . CKD (chronic kidney disease) stage 3, GFR 30-59 ml/min (HCC) 06/20/2017  . Type 2 diabetes mellitus without complication, without long-term current use of insulin ([place]) 11/04/2008  . Coronary atherosclerosis 11/04/2008  . Angina pectoris ([place]) 11/04/2008  . 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    Orientation RESPIRATION BLADDER Height & Weight     Self,Time,Situation,Place  Normal Incontinent Weight: 183 lb 6.8 oz (83.2 kg) Height:  6\' 3"  (190.5 cm)  BEHAVIORAL SYMPTOMS/MOOD NEUROLOGICAL BOWEL NUTRITION STATUS      Continent Diet (Heart healthy/carb modified)  AMBULATORY STATUS COMMUNICATION OF NEEDS Skin   Extensive Assist Verbally Normal                       Personal Care Assistance Level of Assistance  Bathing,Feeding,Dressing Bathing Assistance: Limited assistance Feeding assistance: Independent Dressing Assistance: Limited assistance     Functional Limitations Info  Sight,Hearing,Speech Sight Info: Impaired Hearing Info: Adequate Speech Info: Adequate     SPECIAL CARE FACTORS FREQUENCY  PT (By licensed PT)     PT Frequency: 5x's/week              Contractures Contractures Info: Not present    Additional Factors Info  Code Status,Allergies,Psychotropic,Insulin Sliding Scale Code Status Info: Full Allergies Info: NKA Psychotropic Info: Lexapro, Desyrel Insulin Sliding Scale Info: See discharge summary       Current Medications (07/15/2020):  This is the current hospital active medication list Current Facility-Administered Medications  Medication Dose Route Frequency Provider Last Rate Last Admin  . 0.9 %  sodium chloride infusion  250 mL Intravenous PRN Emokpae, Courage, MD      . acetaminophen (TYLENOL) tablet 650 mg  650 mg Oral Q6H PRN Emokpae, Courage, MD       Or  . acetaminophen (TYLENOL) suppository 650 mg  650 mg Rectal Q6H PRN Emokpae, Courage, MD      . amLODipine (NORVASC) tablet 10 mg  10 mg Oral Daily Emokpae, Courage, MD   10 mg at 07/15/20 0845  . aspirin EC tablet 81 mg  81 mg Oral Daily Emokpae, Courage, MD   81 mg at 07/15/20 0846  . bisacodyl (DULCOLAX) suppository 10 mg  10 mg Rectal Daily PRN Emokpae, Courage, MD      . dabrafenib mesylate (TAFLINAR) capsule 100 mg  100 mg Oral BID [place] Brick, Courage, MD   100 mg at 07/15/20 0532  . escitalopram (LEXAPRO) tablet 10 mg  10 mg Oral Daily Emokpae, Courage, MD   10 mg at 07/15/20 0902  . fentaNYL (SUBLIMAZE) injection  25 mcg  25 mcg Intravenous Q4H PRN Emokpae, Courage, MD      . fentaNYL (SUBLIMAZE) injection 25 mcg  25 mcg Intravenous Once Emokpae, Courage, MD      . glimepiride (AMARYL) tablet 2 mg  2 mg Oral Q breakfast Emokpae, Courage, MD   2 mg at 07/15/20 0845  . heparin injection 5,000 Units  5,000 Units Subcutaneous Q8H Emokpae, Courage, MD   5,000 Units at 07/15/20 0533  . insulin aspart (novoLOG) injection 0-15 Units  0-15 Units Subcutaneous TID WC Roxan Hockey, MD   3 Units at 07/15/20 1222  . insulin aspart (novoLOG) injection 0-5 Units  0-5  Units Subcutaneous QHS Emokpae, Courage, MD      . insulin aspart (novoLOG) injection 4 Units  4 Units Subcutaneous TID WC Roxan Hockey, MD   4 Units at 07/15/20 1223  . isosorbide mononitrate (IMDUR) 24 hr tablet 30 mg  30 mg Oral Daily Emokpae, Courage, MD   30 mg at 07/15/20 0902  . labetalol (NORMODYNE) injection 10 mg  10 mg Intravenous Q4H PRN Emokpae, Courage, MD      . methocarbamol (ROBAXIN) tablet 500 mg  500 mg Oral TID Roxan Hockey, MD   500 mg at 07/15/20 0903  . metoprolol tartrate (LOPRESSOR) tablet 100 mg  100 mg Oral BID [place] Brick, Courage, MD   100 mg at 07/15/20 0910  . multivitamin with minerals tablet 1 tablet  1 tablet Oral Daily Roxan Hockey, MD   1 tablet at 07/15/20 0845  . ondansetron (ZOFRAN) tablet 4 mg  4 mg Oral Q6H PRN D[place] Brick, Courage, MD   4 mg at 07/14/20 1434   Or  . ondansetron (ZOFRAN) injection 4 mg  4 mg Intravenous Q6H PRN Emokpae, Courage, MD      . oxyCODONE (Oxy IR/ROXICODONE) immediate release tablet 5 mg  5 mg Oral Q4H PRN D[place] Brick, Courage, MD   5 mg at 07/15/20 0845  . polyethylene glycol (MIRALAX / GLYCOLAX) packet 17 g  17 g Oral Daily PRN Emokpae, Courage, MD      . sodium chloride flush (NS) 0.9 % injection 3 mL  3 mL Intravenous Q12H Emokpae, Courage, MD   3 mL at 07/15/20 0906  . sodium chloride flush (NS) 0.9 % injection 3 mL  3 mL Intravenous Q12H Emokpae, Courage, MD   3 mL at 07/15/20 0905  . sodium chloride flush (NS) 0.9 % injection 3 mL  3 mL Intravenous PRN Emokpae, Courage, MD      . tamsulosin (FLOMAX) capsule 0.4 mg  0.4 mg Oral QPC supper Emokpae, Courage, MD      . trametinib dimethyl sulfoxide (MEKINIST) tablet 1 mg  1 mg Oral Daily Emokpae, Courage, MD   1 mg at 07/15/20 1022  . traZODone (DESYREL) tablet 50 mg  50 mg Oral QHS PRN Roxan Hockey, MD         Discharge Medications: Please see discharge summary for a list of discharge medications.  Relevant Imaging Results:  Relevant Lab Results:   Additional  Information SSN: [id number]  Sherie Don, LCSW

## 2020-07-15 NOTE — Progress Notes (Signed)
Pt was unable to void, Bladder scan showed 431ml, In and out cathter ordered, 500Ml was removed. Pt tolerated well.

## 2020-07-16 DIAGNOSIS — E119 Type 2 diabetes mellitus without complications: Secondary | ICD-10-CM

## 2020-07-16 DIAGNOSIS — S72102A Unspecified trochanteric fracture of left femur, initial encounter for closed fracture: Secondary | ICD-10-CM

## 2020-07-16 DIAGNOSIS — N1832 Chronic kidney disease, stage 3b: Secondary | ICD-10-CM

## 2020-07-16 DIAGNOSIS — C3490 Malignant neoplasm of unspecified part of unspecified bronchus or lung: Secondary | ICD-10-CM

## 2020-07-16 LAB — GLUCOSE, CAPILLARY
Glucose-Capillary: 201 mg/dL — ABNORMAL HIGH (ref 70–99)
Glucose-Capillary: 232 mg/dL — ABNORMAL HIGH (ref 70–99)
Glucose-Capillary: 220 mg/dL — ABNORMAL HIGH (ref 70–99)
Glucose-Capillary: 210 mg/dL — ABNORMAL HIGH (ref 70–99)

## 2020-07-16 MED ORDER — CHLORHEXIDINE GLUCONATE CLOTH 2 % EX PADS
6.0000 | MEDICATED_PAD | Freq: Every day | CUTANEOUS | Status: DC
  Administered 2020-07-16 – 2020-07-17 (×2): 6 via TOPICAL

## 2020-07-16 NOTE — TOC Progression Note (Signed)
Transition of Care [facility]) - Progression Note    Patient Details  Name: [name] MRN: [id number] Date of Birth: 01-Nov-1940  Transition of Care [facility]) CM/SW [place], [place] Phone Number: 07/16/2020, 3:07 PM  Clinical Narrative:    CSW spoke with patient's wife to identify preferred SNF. Family expressed preference for [facility] due to proximity. CSW addedd facility to ins auth and received auth approval until 03/04. TOC  To follow.  Expected Discharge Plan: [place] Barriers to Discharge: Continued Medical Work up  Expected Discharge Plan and Services Expected Discharge Plan: [place] In-house Referral: Clinical Social Work   Post Acute Care Choice: [place] Living arrangements for the past 2 months: Single Family Home                                       Social Determinants of Health (SDOH) Interventions    Readmission Risk Interventions No flowsheet data found.

## 2020-07-16 NOTE — Progress Notes (Signed)
Patient Demographics:    [name], is a 80 y.o. male, DOB - Oct 14, 1940, [id number]  Admit date - 07/14/2020   Admitting Physician Roxan Hockey, MD  Outpatient Primary MD for the patient is [name], MD  LOS - 1   Chief Complaint  Patient presents with  . Fall        Subjective:    [name] feels that pain is better today, ambulated with physical therapy  Assessment  & Plan :    Principal Problem:   Trochanteric avulsion fracture of femur ([place]) Active Problems:   Type 2 diabetes mellitus without complication, without long-term current use of insulin (HCC)   CKD (chronic kidney disease) stage 3, GFR 30-59 ml/min (HCC)   S/P CABG x 4   Adenocarcinoma of lung, stage 4 (HCC)  Brief Summary:- 80 y.o. 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 -Monitor hemodynamics  4)Hypertension: -Continue isosorbide 30 mg daily, metoprolol 100 mg twice daily, and amlodipine 10 mg daily -IV labetalol as needed elevated BP  5) DM2-no recent A1c -Continue Amaryl with breakfast -Hold Metformin, Actos  and Januvia. Use Novolog/Humalog Sliding scale insulin with Accu-Cheks/Fingersticks as ordered   6)CKD 3B--- renal function appears to be at baseline -  creatinine is down to 1.4 from  1.7 on admission -- avoid excessive NSAID use renally adjust medications, avoid nephrotoxic agents / dehydration  / hypotension  7) falls and ambulatory dysfunction--- PT eval appreciated ,  recommends SNF rehab ---PTA pt lived alone and did very poorly, patient has significant limitations with mobility related ADLs- this patient needs to continue to be monitored in the hospital until a SNF bed is obtained as she is not safe to go home with her current physcical limitations and increased fall risk  8) chronic anemia and acute thrombocytopenia--no obvious bleeding noted, hemoglobin 11.2 which is not far away from patient's baseline which is usually above 12 -Platelets are down to 107 monitor closely -May discontinue heparin for DVT prophylaxis if platelets continue to drop  9) acute urinary retention --requiring in and out catheterization x24hr -Continue Flomax -Foley catheter placed since he required in and out catheterization for 24 hours -We will need voiding trial in the next few days   Disposition/Need for in-Hospital Stay- patient unable to be discharged at this time due to --left hip pain requiring IV pain medication -Ambulatory dysfunction and increased fall risk requiring SNF placement  Dispo: The patient is from: Home  Anticipated d/c is to: SNF  Anticipated d/c date is: 1 day  Patient currently is not medically stable to d/c. Barriers: Not Clinically Stable-   Code Status : -  Code Status: Full Code   Family Communication:   (patient is alert, awake and coherent)  Discussed with wife at bedside Consults  :  ortho  DVT Prophylaxis  :   - SCDs   heparin injection 5,000 Units Start: 07/14/20 1400 SCDs Start: 07/14/20 1242 Place TED hose Start: 07/14/20  1242    Lab Results  Component Value Date   PLT 107 (L) 07/15/2020    Inpatient Medications  Scheduled Meds: . amLODipine  10 mg Oral Daily  . aspirin EC  81 mg Oral Daily  . Chlorhexidine Gluconate Cloth  6 each Topical Daily  . dabrafenib mesylate  100 mg Oral BID  . escitalopram  10 mg Oral Daily  . glimepiride  2 mg Oral Q breakfast  . heparin  5,000 Units Subcutaneous Q8H  . insulin aspart  0-15 Units Subcutaneous TID WC  . insulin aspart  0-5 Units Subcutaneous QHS  . insulin aspart  4 Units Subcutaneous TID WC  . isosorbide mononitrate  30 mg Oral Daily  . methocarbamol  500 mg Oral TID  . metoprolol tartrate  100 mg Oral BID  . multivitamin with minerals  1 tablet Oral Daily  . sodium chloride flush  3 mL Intravenous Q12H  . sodium chloride flush  3 mL Intravenous Q12H  . tamsulosin  0.4 mg Oral QPC supper  . trametinib dimethyl sulfoxide  1 mg Oral Daily   Continuous Infusions: . sodium chloride     PRN Meds:.sodium chloride, acetaminophen **OR** acetaminophen, bisacodyl, fentaNYL (SUBLIMAZE) injection, labetalol, ondansetron **OR** ondansetron (ZOFRAN) IV, oxyCODONE, polyethylene glycol, sodium chloride flush, traZODone    Anti-infectives (From admission, onward)   None        Objective:   Vitals:   07/16/20 0446 07/16/20 0946 07/16/20 1402 07/16/20 2035  BP: (!) 151/81 (!) 152/68 133/71 127/71  Pulse: 63 63 66 64  Resp: _0 Temp: 98.2 F (36.8 C) 97.7 F (36.5 C) 97.8 F (36.6 C) 97.6 F (36.4 C)  TempSrc:  Oral Oral Oral  SpO2: 95% 99% 97% 97%  Weight:      Height:        Wt Readings from Last 3 Encounters:  07/14/20 83.2 kg  06/12/20 87.3 kg  03/12/20 88.3 kg     Intake/Output Summary (Last 24 hours) at 07/16/2020 2141 Last data filed at 07/16/2020 1815 Gross per 24 hour  Intake 1220 ml  Output 1500 ml  Net -280 ml     Physical Exam  General exam: Alert, awake, oriented x 3 Respiratory system: Clear to auscultation.  Respiratory effort normal. Cardiovascular system:RRR. No murmurs, rubs, gallops. Gastrointestinal system: Abdomen is nondistended, soft and nontender. No organomegaly or masses felt. Normal bowel sounds heard. Central nervous system: Alert and oriented. No focal neurological deficits. Extremities: No C/C/E, +pedal pulses Skin: No rashes, lesions or ulcers Psychiatry: Judgement and insight appear normal. Mood & affect appropriate.     Data Review:   Micro Results Recent Results (from the past 240 hour(s))  Resp Panel by RT-PCR (Flu A&B, Covid) Nasopharyngeal Swab     Status: None   Collection Time: 07/14/20  8:17 AM   Specimen: Nasopharyngeal Swab; Nasopharyngeal(NP) swabs in vial transport medium  Result Value Ref Range Status   SARS Coronavirus 2 by RT PCR NEGATIVE NEGATIVE Final  Comment: (NOTE) SARS-CoV-2 target nucleic acids are NOT DETECTED.  The SARS-CoV-2 RNA is generally detectable in upper respiratory specimens during the acute phase of infection. The lowest concentration of SARS-CoV-2 viral copies this assay can detect is 138 copies/mL. A negative result does not preclude SARS-Cov-2 infection and should not be used as the sole basis for treatment or other patient management decisions. A negative result may occur with  improper specimen collection/handling, submission of specimen other than nasopharyngeal swab, presence of viral mutation(s) within the areas targeted by this assay, and inadequate number of viral copies(<138 copies/mL). A negative result must be combined with clinical observations, patient history, and epidemiological information. The expected result is Negative.  Fact Sheet for Patients:  EntrepreneurPulse.com.au  Fact Sheet for Healthcare Providers:  IncredibleEmployment.be  This test is no t yet approved or cleared by the [place] FDA and  has been authorized for detection and/or diagnosis of SARS-CoV-2  by FDA under an Emergency Use Authorization (EUA). This EUA will remain  in effect (meaning this test can be used) for the duration of the COVID-19 declaration under Section 564(b)(1) of the Act, 21 U.S.C.section 360bbb-3(b)(1), unless the authorization is terminated  or revoked sooner.       Influenza A by PCR NEGATIVE NEGATIVE Final   Influenza B by PCR NEGATIVE NEGATIVE Final    Comment: (NOTE) The Xpert Xpress SARS-CoV-2/FLU/RSV plus assay is intended as an aid in the diagnosis of influenza from Nasopharyngeal swab specimens and should not be used as a sole basis for treatment. Nasal washings and aspirates are unacceptable for Xpert Xpress SARS-CoV-2/FLU/RSV testing.  Fact Sheet for Patients: EntrepreneurPulse.com.au  Fact Sheet for Healthcare Providers: IncredibleEmployment.be  This test is not yet approved or cleared by the [place] FDA and has been authorized for detection and/or diagnosis of SARS-CoV-2 by FDA under an Emergency Use Authorization (EUA). This EUA will remain in effect (meaning this test can be used) for the duration of the COVID-19 declaration under Section 564(b)(1) of the Act, 21 U.S.C. section 360bbb-3(b)(1), unless the authorization is terminated or revoked.  Performed at [facility], [address], [place], [place] [address]     Radiology Reports DG Chest 1 View  Result Date: 07/14/2020 CLINICAL DATA:  Slip and fall this morning. EXAM: CHEST  1 VIEW COMPARISON:  Chest CT-03/11/2020 FINDINGS: Grossly unchanged enlarged cardiac silhouette and mediastinal contours with atherosclerotic plaque within the thoracic aorta. Post median sternotomy and CABG. Redemonstrated extensive bilateral basilar predominant pulmonary nodules as better demonstrated on chest CT performed 03/11/2020. Minimal prior hilar heterogeneous opacities are unchanged favored to represent atelectasis. No new focal airspace opacities. No pleural  effusion or pneumothorax. No evidence of edema. No acute osseous abnormalities. IMPRESSION: 1. No acute cardiopulmonary disease. 2. Redemonstrated extensive bilateral basilar predominant pulmonary nodules as better demonstrated on chest CT performed 03/11/2020. Electronically Signed   By: Sandi Mariscal M.D.   On: 07/14/2020 07:34   DG Hip Unilat W or Wo Pelvis 2-3 Views Left  Result Date: 07/14/2020 CLINICAL DATA:  Slip and fall this morning, now with left hip pain. EXAM: DG HIP (WITH OR WITHOUT PELVIS) 2-3V LEFT COMPARISON:  CT abdomen and pelvis 05/21/2019 FINDINGS: There is a suspected acute nondisplaced fracture involving the greater trochanter the femur. No intra-articular extension. Left hip joint spaces appear preserved. No additional fractures are identified. Limited visualization of the pelvis and contralateral left hip is normal. Seminal vessel and vascular calcifications overlie the pelvis bilaterally. Punctate phleboliths overlie the lower pelvis.  A bone island is seen within the inter trochanteric region of the right femur. No radiopaque foreign body. IMPRESSION: Suspected acute nondisplaced fracture involving the greater trochanter of the left femur. Electronically Signed   By: Sandi Mariscal M.D.   On: 07/14/2020 07:32     CBC Recent Labs  Lab 07/14/20 0600 07/15/20 0404  WBC 6.4 4.9  HGB 13.4 11.2*  HCT 39.0 33.2*  PLT 113* 107*  MCV 84.4 85.3  MCH 29.0 28.8  MCHC 34.4 33.7  RDW 15.1 15.1  LYMPHSABS 0.3*  --   MONOABS 0.5  --   EOSABS 0.0  --   BASOSABS 0.0  --     Chemistries  Recent Labs  Lab 07/14/20 0600 07/15/20 0404  NA 133* 135  K 3.9 3.8  CL 97* 99  CO2 24 25  GLUCOSE 291* 156*  BUN 25* 23  CREATININE 1.72* 1.43*  CALCIUM 9.1 8.5*  AST 23  --   ALT 17  --   ALKPHOS 73  --   BILITOT 0.9  --    ------------------------------------------------------------------------------------------------------------------ No results for input(s): CHOL, HDL, LDLCALC,  TRIG, CHOLHDL, LDLDIRECT in the last 72 hours.  Lab Results  Component Value Date   HGBA1C (H) 09/09/2010    6.2 (NOTE)                                                                       According to the ADA Clinical Practice Recommendations for 2011, when HbA1c is used as a screening test:   >=6.5%   Diagnostic of Diabetes Mellitus           (if abnormal result  is confirmed)  5.7-6.4%   Increased risk of developing Diabetes Mellitus  References:Diagnosis and Classification of Diabetes Mellitus,Diabetes Care,2011,34(Suppl 1):S62-S69 and Standards of Medical Care in         Diabetes - 2011,Diabetes GYBW,3893,73  (Suppl 1):S11-S61.   ------------------------------------------------------------------------------------------------------------------ No results for input(s): TSH, T4TOTAL, T3FREE, THYROIDAB in the last 72 hours.  Invalid input(s): FREET3 ------------------------------------------------------------------------------------------------------------------ No results for input(s): VITAMINB12, FOLATE, FERRITIN, TIBC, IRON, RETICCTPCT in the last 72 hours.  Coagulation profile No results for input(s): INR, PROTIME in the last 168 hours.  No results for input(s): DDIMER in the last 72 hours.  Cardiac Enzymes No results for input(s): CKMB, TROPONINI, MYOGLOBIN in the last 168 hours.  Invalid input(s): CK ------------------------------------------------------------------------------------------------------------------ No results found for: BNP   Kathie Dike M.D on 07/16/2020 at 9:41 PM  Go to [email or web address] - for contact info  [facility] - Office  [phone]

## 2020-07-16 NOTE — Progress Notes (Signed)
Physical Therapy Treatment Patient Details Name: [name] MRN: [id number] DOB: 06-05-1940 Today's Date: 07/16/2020    History of Present Illness [name]  is a 80 y.o. male with stage IV metastatic lung cancer, DM2, HTN, CKD 3B, presents to the ED with left hip pain after falling at home  -Denies chest pains palpitations dizziness or pleuritic symptoms before or after falling  -Denies seizure type activity, incontinence, loss of consciousness or confusion after or before falling  -  In the ED-x-rays of the hip demonstrates acute nondisplaced fracture involving the greater trochanter of the left femur--  -creatinine is 1.7 which is close to patient's baseline  -EDP attempted to manage patient's pain however became too difficult to manage requested obs for pain control    PT Comments    Patient demonstrates increased endurance/distance for taking steps with improvement for left heel to toe stepping without having to shuffle foot, required active assistance to complete most exercises with LLE due to increased pain while seated at EOB and tolerated sitting up in chair to eat lunch with his spouse present after therapy.  Patient will benefit from continued physical therapy in hospital and recommended venue below to increase strength, balance, endurance for safe ADLs and gait.   Follow Up Recommendations  SNF     Equipment Recommendations  Rolling walker with 5" wheels    Recommendations for Other Services       Precautions / Restrictions Precautions Precautions: Fall Restrictions Weight Bearing Restrictions: Yes LLE Weight Bearing: Weight bearing as tolerated    Mobility  Bed Mobility Overal bed mobility: Needs Assistance Bed Mobility: Supine to Sit     Supine to sit: Mod assist;HOB elevated     General bed mobility comments: increased time, labored movement    Transfers Overall transfer level: Needs assistance Equipment used: Rolling walker (2 wheeled) Transfers: Sit  to/from [facility] Sit to Stand: Mod assist Stand pivot transfers: Mod assist       General transfer comment: limited weightbearing on LLE due to increased hip pain  Ambulation/Gait Ambulation/Gait assistance: Min assist;Mod assist Gait Distance (Feet): 20 Feet Assistive device: Rolling walker (2 wheeled) Gait Pattern/deviations: Decreased step length - right;Decreased step length - left;Decreased stance time - left;Decreased stride length;Antalgic Gait velocity: decreased   General Gait Details: slow labored cadence with improvement for advancing LLE with fair return for heel to toe stepping, increased time for making turns and limited due to fatigue and left hip pain   Stairs             Wheelchair Mobility    Modified Rankin (Stroke Patients Only)       Balance Overall balance assessment: Needs assistance Sitting-balance support: Feet supported;No upper extremity supported Sitting balance-Leahy Scale: Fair Sitting balance - Comments: fair/good seated at EOB   Standing balance support: During functional activity;Bilateral upper extremity supported Standing balance-Leahy Scale: Fair Standing balance comment: using RW                            Cognition Arousal/Alertness: Awake/alert Behavior During Therapy: WFL for tasks assessed/performed Overall Cognitive Status: Within Functional Limits for tasks assessed                                        Exercises General Exercises - Lower Extremity Long Arc Quad: Seated;AROM;AAROM;Strengthening;Both;10 reps Hip Flexion/Marching:  Seated;AROM;AAROM;Strengthening;Both;10 reps Toe Raises: Seated;AROM;Strengthening;Both;15 reps Heel Raises: Seated;AROM;Strengthening;Both;15 reps    General Comments        Pertinent Vitals/Pain Pain Assessment: Faces Faces Pain Scale: Hurts even more Pain Location: left hip Pain Descriptors / Indicators: Sore;Grimacing;Guarding Pain  Intervention(s): Limited activity within patient's tolerance;Monitored during session;Repositioned;Premedicated before session    Home Living                      Prior Function            PT Goals (current goals can now be found in the care plan section) Acute Rehab PT Goals Patient Stated Goal: return home after rehab PT Goal Formulation: With patient/family Time For Goal Achievement: 07/29/20 Potential to Achieve Goals: Good Progress towards PT goals: Progressing toward goals    Frequency    Min 3X/week      PT Plan      Co-evaluation              AM-PAC PT "6 Clicks" Mobility   Outcome Measure  Help needed turning from your back to your side while in a flat bed without using bedrails?: A Lot Help needed moving from lying on your back to sitting on the side of a flat bed without using bedrails?: A Lot Help needed moving to and from a bed to a chair (including a wheelchair)?: A Lot Help needed standing up from a chair using your arms (e.g., wheelchair or bedside chair)?: A Lot Help needed to walk in hospital room?: A Lot Help needed climbing 3-5 steps with a railing? : Total 6 Click Score: 11    End of Session   Activity Tolerance: Patient tolerated treatment well;Patient limited by fatigue Patient left: in chair;with call bell/phone within reach;with family/visitor present Nurse Communication: Mobility status PT Visit Diagnosis: Unsteadiness on feet (R26.81);Other abnormalities of gait and mobility (R26.89);Muscle weakness (generalized) (M62.81)     Time: 4128-7867 PT Time Calculation (min) (ACUTE ONLY): 26 min  Charges:  $Gait Training: 8-22 mins $Therapeutic Exercise: 8-22 mins                     12:35 PM, 07/16/20 Lonell Grandchild, MPT Physical Therapist with [facility] [phone] office [phone] mobile phone

## 2020-07-16 NOTE — Consult Note (Signed)
ORTHOPAEDIC CONSULTATION  REQUESTING PHYSICIAN: Kathie Dike, MD  ASSESSMENT AND PLAN: 80 y.o. male with the following: Left greater trochanter femur fracture, minimally displaced  Reasonable for medical admission for pain control and work with PT; can provide consultation and follow along PRN  - Weight Bearing Status/Activity: WBAT Left lower extremity; avoid active left hip abduction  - Additional recommended labs/tests: None  -VTE Prophylaxis: at discretion of medicine team  - Pain control: As needed; recommend limited use of narcotics  - Follow-up plan: 2-3 weeks in clinic; will repeat XR.    - Anticipate progressive improvement in pain and function; will require 2-3 months for full recovery   Chief Complaint: Left hip pain  HPI: [name] is a 80 y.o. male who presented to the ED after sustaining a mechanical fall immediately prior to coming to the hospital on Monday morning.  He was changing his underwear, when he lost his balance and landed on his left side. He is complaining of Left hip pain.  No pain elsewhere.  Did not hit their head.  Unable to ambulate since the fall.  The pain is sharp with attempted movements.  As long as they are not moving, the pain improves.  The pain does not radiate.  Since admission, he has been able to ambulate with assistance.  The plan is for him to go to rehab once he is stable for discharge.  Past Medical History:  Diagnosis Date  . Cancer ([place])   . Chronic kidney disease   . Coronary artery disease   . Diabetes mellitus   . Hypertension   . 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 Spontaneous pneumothorax 09/15/2010   right   Past Surgical History:  Procedure Laterality Date  . ANKLE FRACTURE SURGERY  2008   [facility]  . APPENDECTOMY  1960's  . CHEST TUBE INSERTION Right 09/15/2010   Dr Servando Snare - spontaneous PTX   . CHOLECYSTECTOMY  2008  . COLONOSCOPY N/A 09/07/2017   Procedure: COLONOSCOPY;  Surgeon: Daneil Dolin, MD;  Location[facility];  Service: Endoscopy;  Laterality: N/A;  10:30am  . CORONARY ARTERY BYPASS GRAFT  2003  . EYE SURGERY  2001   Cataracts removed bilaterally  . INCISIONAL HERNIA REPAIR N/A 01/29/2015   Procedure: Fatima Blank HERNIORRHAPHY WITH MESH;  Surgeon: Aviva Signs, MD;  Location: [facility] ORS;  Service: General;  Laterality: N/A;  . INSERTION OF MESH N/A 01/29/2015   Procedure: INSERTION OF MESH;  Surgeon: Aviva Signs, MD;  Location: [facility] ORS;  Service: General;  Laterality: N/A;  . [place]  . LEFT HEART CATH AND CORS/GRAFTS ANGIOGRAPHY N/A 06/20/2017   Procedure: LEFT HEART CATH AND CORS/GRAFTS ANGIOGRAPHY;  Surgeon: Martinique, Peter M, MD;  Location: [place] CV LAB;  Service: Cardiovascular;  Laterality: N/A;  . ORIF ANKLE FRACTURE  08/26/2011   Procedure: OPEN REDUCTION INTERNAL FIXATION (ORIF) ANKLE FRACTURE;  Surgeon: Sanjuana Kava, MD;  Location: [facility] ORS;  Service: Orthopedics;  Laterality: Right;  . POLYPECTOMY  09/07/2017   Procedure: POLYPECTOMY;  Surgeon: Daneil Dolin, MD;  Location: [facility];  Service: Endoscopy;;  ascending x2 (cold snare)  . PROSTATE SURGERY  2012   Enlarged prostate  . TRANSURETHRAL RESECTION OF PROSTATE  2012   Social History   Socioeconomic History  .  Marital status: Married    Spouse name: Not on file  . Number of children: Not on file  . Years of education: Not on file  . Highest education level: Not on file  Occupational History  . Not on file  Tobacco Use  . Smoking status: Former Smoker    Packs/day: 1.00    Years: 33.00    Pack years: 33.00    Quit date: 05/17/1987    Years since quitting: 33.1  . Smokeless tobacco: Never Used  Vaping Use  . Vaping Use: Never used  Substance and Sexual Activity  . Alcohol use: No  . Drug use: No  . Sexual activity: Yes  Other Topics Concern  .  Not on file  Social History Narrative  . Not on file   Social Determinants of Health   Financial Resource Strain: Low Risk   . Difficulty of Paying Living Expenses: Not very hard  Food Insecurity: No Food Insecurity  . Worried About Charity fundraiser in the Last Year: Never true  . Ran Out of Food in the Last Year: Never true  Transportation Needs: No Transportation Needs  . Lack of Transportation (Medical): No  . Lack of Transportation (Non-Medical): No  Physical Activity: Inactive  . Days of Exercise per Week: 0 days  . Minutes of Exercise per Session: 0 min  Stress: No Stress Concern Present  . Feeling of Stress : Not at all  Social Connections: Socially Integrated  . Frequency of Communication with Friends and Family: More than three times a week  . Frequency of Social Gatherings with Friends and Family: Three times a week  . Attends Religious Services: 1 to 4 times per year  . Active Member of Clubs or Organizations: No  . Attends Archivist Meetings: 1 to 4 times per year  . Marital Status: Married   Family History  Problem Relation Age of Onset  . Aneurysm Mother   . Heart attack Father   . Colon cancer Maternal Uncle   . Cancer Brother   . Gastric cancer Neg Hx   . Esophageal cancer Neg Hx    No Known Allergies Prior to Admission medications   Medication Sig Start Date End Date Taking? Authorizing Provider  amLODipine (NORVASC) 10 MG tablet Take 1 tablet (10 mg total) by mouth daily. 07/09/20  Yes Strader, Fransisco Hertz, PA-C  aspirin EC 81 MG tablet Take 81 mg by mouth daily.   Yes [provider]  Cholecalciferol (VITAMIN D3) 5000 units CAPS Take 5,000 Units by mouth daily.   Yes [provider]  dabrafenib mesylate (TAFINLAR) 50 MG capsule TAKE 2 CAPSULES (100 MG TOTAL) BY MOUTH 2 (TWO) TIMES DAILY. TAKE ON AN EMPTY STOMACH 1 HOUR BEFORE OR 2 HOURS AFTER MEALS. Patient taking differently: Take 100 mg by mouth 2 (two) times daily. TAKE ON  AN EMPTY STOMACH 1 HOUR BEFORE OR 2 HOURS AFTER MEALS. 05/19/20  Yes Derek Jack, MD  escitalopram (LEXAPRO) 10 MG tablet Take 10 mg by mouth daily.  04/03/18  Yes [provider]  glimepiride (AMARYL) 2 MG tablet Take 2 mg by mouth daily with breakfast.    Yes [provider]  isosorbide mononitrate (IMDUR) 30 MG 24 hr tablet TAKE ONE TABLET (30MG TOTAL) BY MOUTH DAILY Patient taking differently: Take 30 mg by mouth daily. 01/09/20  Yes Strader, [place] M, PA-C  metFORMIN (GLUCOPHAGE) 500 MG tablet Take 1 tablet (500 mg total) by mouth 2 (two) times  daily with a meal. 06/23/17  Yes Martinique, Peter M, MD  metoprolol (LOPRESSOR) 100 MG tablet Take 100 mg by mouth 2 (two) times daily.   Yes [provider]  pioglitazone (ACTOS) 45 MG tablet Take 45 mg by mouth daily.   Yes [provider]  trametinib dimethyl sulfoxide (MEKINIST) 0.5 MG tablet TAKE 2 TABLETS (1 MG TOTAL) BY MOUTH DAILY. TAKE 1 HOUR BEFORE OR 2 HOURS AFTER A MEAL. STORE REFRIGERATED IN ORIGINAL CONTAINER. Patient taking differently: Take 1 mg by mouth daily. TAKE 1 HOUR BEFORE OR 2 HOURS AFTER A MEAL. STORE REFRIGERATED IN ORIGINAL CONTAINER. 05/15/20  Yes Derek Jack, MD  nitroGLYCERIN (NITROSTAT) 0.4 MG SL tablet Place 0.4 mg under the tongue every 5 (five) minutes as needed for chest pain.  06/01/17   [provider]  ondansetron (ZOFRAN) 8 MG tablet Take 1 tablet (8 mg total) by mouth every 8 (eight) hours as needed for nausea or vomiting. Patient not taking: Reported on 07/14/2020 09/27/17   Derek Jack, MD  ONE TOUCH ULTRA TEST test strip  04/17/18   [provider]  prochlorperazine (COMPAZINE) 10 MG tablet Take 1 tablet (10 mg total) by mouth every 6 (six) hours as needed for nausea or vomiting. Patient not taking: Reported on 07/14/2020 09/27/17   Derek Jack, MD   No results found. Family History Reviewed and non-contributory, no pertinent history  of problems with bleeding or anesthesia    Review of Systems No fevers or chills No numbness or tingling No bowel or bladder dysfunction No GI distress No headaches    OBJECTIVE  Vitals: Patient Vitals for the past 8 hrs:  BP Temp Temp src Pulse SpO2  07/16/20 0946 (!) 152/68 97.7 F (36.5 C) Oral 63 99 %   General: Alert, no acute distress Cardiovascular: Warm extremities noted Respiratory: No cyanosis, no use of accessory musculature Skin: No lesions in the area of chief complaint other than those listed below in MSK exam. No ecchymosis over the lateral hip.  Neurologic: Sensation intact distally save for the below mentioned MSK exam Psychiatric: Patient is competent for consent with normal mood and affect Lymphatic: No swelling obvious and reported other than the area involved in the exam below Extremities  LLE:  ROM deferred due to know fracture.  Tolerates axial loading and gentle logroll, some pain with logroll.  Sensation is intact in the S/S/SP/DP/T nerve distributions.  Active motion intact TA/EHL/GS.  Toes WWP.  2+ DP pulse.  RLE: No pain with gentle ROM of hip and rest of LE.  Sensation is intact in the S/S/SP/DP/T nerve distributions.  Active motion intact TA/EHL/GS.  Toes WWP.  2+ DP pulse.     Test Results Imaging XR of the left hip demonstrates minimally displaced fracture of the greater trochanter..    Labs cbc Recent Labs    07/14/20 0600 07/15/20 0404  WBC 6.4 4.9  HGB 13.4 11.2*  HCT 39.0 33.2*  PLT 113* 107*    Labs inflam No results for input(s): CRP in the last 72 hours.  Invalid input(s): ESR  Labs coag No results for input(s): INR, PTT in the last 72 hours.  Invalid input(s): PT  Recent Labs    07/14/20 0600 07/15/20 0404  NA 133* 135  K 3.9 3.8  CL 97* 99  CO2 24 25  GLUCOSE 291* 156*  BUN 25* 23  CREATININE 1.72* 1.43*  CALCIUM 9.1 8.5*

## 2020-07-16 NOTE — Progress Notes (Signed)
Pt was unable to void, Bladder scan showed 349ml, In and out cathter ordered, 500Ml was removed. Pt tolerated well.

## 2020-07-17 DIAGNOSIS — S72102D Unspecified trochanteric fracture of left femur, subsequent encounter for closed fracture with routine healing: Secondary | ICD-10-CM | POA: Diagnosis not present

## 2020-07-17 DIAGNOSIS — S72115D Nondisplaced fracture of greater trochanter of left femur, subsequent encounter for closed fracture with routine healing: Secondary | ICD-10-CM | POA: Diagnosis not present

## 2020-07-17 DIAGNOSIS — R1111 Vomiting without nausea: Secondary | ICD-10-CM | POA: Diagnosis not present

## 2020-07-17 DIAGNOSIS — R1110 Vomiting, unspecified: Secondary | ICD-10-CM | POA: Diagnosis not present

## 2020-07-17 DIAGNOSIS — S72102A Unspecified trochanteric fracture of left femur, initial encounter for closed fracture: Secondary | ICD-10-CM | POA: Diagnosis not present

## 2020-07-17 DIAGNOSIS — E1122 Type 2 diabetes mellitus with diabetic chronic kidney disease: Secondary | ICD-10-CM | POA: Diagnosis not present

## 2020-07-17 DIAGNOSIS — Z794 Long term (current) use of insulin: Secondary | ICD-10-CM | POA: Diagnosis not present

## 2020-07-17 DIAGNOSIS — I129 Hypertensive chronic kidney disease with stage 1 through stage 4 chronic kidney disease, or unspecified chronic kidney disease: Secondary | ICD-10-CM | POA: Diagnosis not present

## 2020-07-17 DIAGNOSIS — Z951 Presence of aortocoronary bypass graft: Secondary | ICD-10-CM | POA: Diagnosis not present

## 2020-07-17 DIAGNOSIS — R5381 Other malaise: Secondary | ICD-10-CM | POA: Diagnosis not present

## 2020-07-17 DIAGNOSIS — M6281 Muscle weakness (generalized): Secondary | ICD-10-CM | POA: Diagnosis not present

## 2020-07-17 DIAGNOSIS — N3001 Acute cystitis with hematuria: Secondary | ICD-10-CM | POA: Diagnosis not present

## 2020-07-17 DIAGNOSIS — R2689 Other abnormalities of gait and mobility: Secondary | ICD-10-CM | POA: Diagnosis not present

## 2020-07-17 DIAGNOSIS — Z859 Personal history of malignant neoplasm, unspecified: Secondary | ICD-10-CM | POA: Diagnosis not present

## 2020-07-17 DIAGNOSIS — D649 Anemia, unspecified: Secondary | ICD-10-CM | POA: Diagnosis not present

## 2020-07-17 DIAGNOSIS — N1832 Chronic kidney disease, stage 3b: Secondary | ICD-10-CM | POA: Diagnosis not present

## 2020-07-17 DIAGNOSIS — Z79899 Other long term (current) drug therapy: Secondary | ICD-10-CM | POA: Diagnosis not present

## 2020-07-17 DIAGNOSIS — I10 Essential (primary) hypertension: Secondary | ICD-10-CM | POA: Diagnosis not present

## 2020-07-17 DIAGNOSIS — Z7982 Long term (current) use of aspirin: Secondary | ICD-10-CM | POA: Diagnosis not present

## 2020-07-17 DIAGNOSIS — Z87891 Personal history of nicotine dependence: Secondary | ICD-10-CM | POA: Diagnosis not present

## 2020-07-17 DIAGNOSIS — Z743 Need for continuous supervision: Secondary | ICD-10-CM | POA: Diagnosis not present

## 2020-07-17 DIAGNOSIS — E119 Type 2 diabetes mellitus without complications: Secondary | ICD-10-CM | POA: Diagnosis not present

## 2020-07-17 DIAGNOSIS — C3490 Malignant neoplasm of unspecified part of unspecified bronchus or lung: Secondary | ICD-10-CM | POA: Diagnosis not present

## 2020-07-17 DIAGNOSIS — N183 Chronic kidney disease, stage 3 unspecified: Secondary | ICD-10-CM | POA: Diagnosis not present

## 2020-07-17 DIAGNOSIS — S72002A Fracture of unspecified part of neck of left femur, initial encounter for closed fracture: Secondary | ICD-10-CM | POA: Diagnosis not present

## 2020-07-17 DIAGNOSIS — Z7984 Long term (current) use of oral hypoglycemic drugs: Secondary | ICD-10-CM | POA: Diagnosis not present

## 2020-07-17 DIAGNOSIS — I2510 Atherosclerotic heart disease of native coronary artery without angina pectoris: Secondary | ICD-10-CM | POA: Diagnosis not present

## 2020-07-17 LAB — GLUCOSE, CAPILLARY
Glucose-Capillary: 208 mg/dL — ABNORMAL HIGH (ref 70–99)
Glucose-Capillary: 229 mg/dL — ABNORMAL HIGH (ref 70–99)

## 2020-07-17 MED ORDER — POLYETHYLENE GLYCOL 3350 17 G PO PACK: 17.0000 g | PACK | Freq: Every day | ORAL | 0 refills | Status: AC | PRN

## 2020-07-17 MED ORDER — TAMSULOSIN HCL 0.4 MG PO CAPS: 0.4000 mg | ORAL_CAPSULE | Freq: Every day | ORAL | Status: DC

## 2020-07-17 MED ORDER — METHOCARBAMOL 500 MG PO TABS: 500.0000 mg | ORAL_TABLET | Freq: Three times a day (TID) | ORAL | Status: DC

## 2020-07-17 MED ORDER — ASPIRIN EC 81 MG PO TBEC: 81.0000 mg | DELAYED_RELEASE_TABLET | Freq: Two times a day (BID) | ORAL | 11 refills | Status: DC

## 2020-07-17 MED ORDER — METHOCARBAMOL 500 MG PO TABS: 500.0000 mg | ORAL_TABLET | Freq: Three times a day (TID) | ORAL | Status: DC | PRN

## 2020-07-17 MED ORDER — OXYCODONE HCL 5 MG PO TABS: 5.0000 mg | ORAL_TABLET | ORAL | 0 refills | Status: DC | PRN

## 2020-07-17 NOTE — TOC Transition Note (Signed)
Transition of Care [facility]) - CM/SW Discharge Note  Patient Details  Name: [name] MRN: [id number] Date of Birth: 08-30-1940  [facility]) CM/SW Contact:  Sherie Don, LCSW Phone Number: 07/17/2020, 3:00 PM  Clinical Narrative: Patient stable for discharge to SNF. TOC confirmed insurance authorization completion. Debbie at [facility] confirmed bed. Discharge summary and discharge orders faxed to [place]. EMS scheduled; medical necessity form completed and provided to RN. TOC signing off.  Final next level of care: Skilled Nursing Facility Barriers to Discharge: Barriers Resolved  Patient Goals and CMS Choice Patient states their goals for this hospitalization and ongoing recovery are:: Go to rehab CMS Medicare.gov Compare Post Acute Care list provided to:: Patient Represenative (must comment) Choice offered to / list presented to : Spouse  Discharge Placement PASRR number recieved: 07/15/20       Patient chooses bed at: [facility] Patient to be transferred to facility by: [facility]  Discharge Plan and Services In-house Referral: Clinical Social Work Post Acute Care Choice: [place]  Readmission Risk Interventions Readmission Risk Prevention Plan 07/17/2020  Transportation Screening Complete  PCP or Specialist Appt within 5-7 Days Not Complete  Not Complete comments Patient discharging to SNF  Home Care Screening Complete  Medication Review (RN CM) Complete  Some recent data might be hidden

## 2020-07-17 NOTE — Discharge Summary (Signed)
Physician Discharge Summary  [name] KVQ:[id number] DOB: Jan 13, 1941 DOA: 07/14/2020  PCP: [name], MD  Admit date: 07/14/2020 Discharge date: 07/17/2020  Admitted From: home Disposition:  SNF  Recommendations for Outpatient Follow-up:  1. Follow up with PCP in 1-2 weeks 2. Please obtain BMP/CBC in one week 3. Patient will be discharged with foley catheter for urinary retention. Please consider voiding trial in 2-3 days 4. Follow up with Dr. Amedeo Kinsman, orthopedics in 2-3 weeks with repeat xrays 5. WBAT left lower extremity    Discharge Condition: stable CODE STATUS:full code Diet recommendation: heart healthy, carb modified  Brief/Interim Summary: 80 y.o.malewith stage IV metastatic lung cancer, DM2, HTN, CKD 3B, admitted 07/14/20 with left hip pain after falling at home and sustaining acute nondisplaced fracture involving the greater trochanter of the left femur-- -patient having difficulty with pain control and ambulation PT recommends SNF rehab  Discharge Diagnoses:  Principal Problem:   Trochanteric avulsion fracture of femur ([place]) Active Problems:   Type 2 diabetes mellitus without complication, without long-term current use of insulin (HCC)   CKD (chronic kidney disease) stage 3, GFR 30-59 ml/min (HCC)   S/P CABG x 4   Adenocarcinoma of lung, stage 4 (HCC)  )Lt Hip Pain--x-rays of the hip demonstratesacute nondisplaced fracture involving the greater trochanter of the left femur-- -Patient with CKD 3B avoid excessive NSAID use -Feels that pain is better today -seen by orthopedics who did not recommend operative management -WBAT as tolerated -Follow up with ortho in 2-3 weeks  2)Metastatic adenocarcinoma of the lung, 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 -Monitor hemodynamics  4)Hypertension:-Continue isosorbide 30 mg daily,metoprolol 100 mg twice daily, and amlodipine 10 mg daily -IV labetalol as needed elevated BP  5) DM2-no recent A1c -Continue Amaryl with breakfast -will discontinue pioglitazone and metformin due to compromised renal function  6)CKD 3B---renal function appears to be at baseline -  creatinine is down to 1.4 from  1.7 on admission --avoid excessive NSAID use renally adjust medications, avoid nephrotoxic agents / dehydration / hypotension  7) falls and ambulatory dysfunction--- PT eval appreciated ,  recommends SNF rehab  8) chronic anemia and acute thrombocytopenia--no obvious bleeding noted, hemoglobin 11.2 which is not far away from patient's baseline which is usually above 12 -Platelets are down to 107 monitor closely -repeat CBC in 1 week  9) acute urinary retention --requiring in and out catheterization x24hr -Continue Flomax -Foley catheter placed 3/2since he required in and out catheterization for 24 hours -He will need voiding trial in the next few days and if urinary retention persists, will need urology referral   Discharge Instructions  Discharge Instructions    Diet - low sodium heart healthy   Complete by: As directed    Increase activity slowly   Complete by: As directed      Allergies as of 07/17/2020   No Known Allergies     Medication List    STOP taking these medications   metFORMIN 500 MG tablet Commonly known as: GLUCOPHAGE   pioglitazone 45 MG tablet Commonly known as: ACTOS     TAKE these medications   amLODipine 10 MG tablet Commonly known as: NORVASC Take 1 tablet (10 mg total) by mouth daily.   aspirin EC 81 MG tablet Take 1  tablet (81 mg total) by mouth in the morning and at bedtime. What changed: when to take this   dabrafenib mesylate 50 MG capsule Commonly known as: Tafinlar TAKE 2 CAPSULES (100 MG  TOTAL) BY MOUTH 2 (TWO) TIMES DAILY. TAKE ON AN EMPTY STOMACH 1 HOUR BEFORE OR 2 HOURS AFTER MEALS. What changed:   how much to take  how to take this  when to take this  additional instructions   escitalopram 10 MG tablet Commonly known as: LEXAPRO Take 10 mg by mouth daily.   glimepiride 2 MG tablet Commonly known as: AMARYL Take 2 mg by mouth daily with breakfast.   isosorbide mononitrate 30 MG 24 hr tablet Commonly known as: IMDUR TAKE ONE TABLET (30MG TOTAL) BY MOUTH DAILY What changed:   how much to take  how to take this  when to take this  additional instructions   methocarbamol 500 MG tablet Commonly known as: ROBAXIN Take 1 tablet (500 mg total) by mouth every 8 (eight) hours as needed for muscle spasms.   metoprolol tartrate 100 MG tablet Commonly known as: LOPRESSOR Take 100 mg by mouth 2 (two) times daily.   nitroGLYCERIN 0.4 MG SL tablet Commonly known as: NITROSTAT Place 0.4 mg under the tongue every 5 (five) minutes as needed for chest pain.   ondansetron 8 MG tablet Commonly known as: ZOFRAN Take 1 tablet (8 mg total) by mouth every 8 (eight) hours as needed for nausea or vomiting.   ONE TOUCH ULTRA TEST test strip Generic drug: glucose blood   oxyCODONE 5 MG immediate release tablet Commonly known as: Oxy IR/ROXICODONE Take 1 tablet (5 mg total) by mouth every 4 (four) hours as needed for moderate pain.   polyethylene glycol 17 g packet Commonly known as: MIRALAX / GLYCOLAX Take 17 g by mouth daily as needed for mild constipation.   prochlorperazine 10 MG tablet Commonly known as: COMPAZINE Take 1 tablet (10 mg total) by mouth every 6 (six) hours as needed for nausea or vomiting.   tamsulosin 0.4 MG Caps capsule Commonly known as: FLOMAX Take 1 capsule (0.4 mg total) by mouth daily after supper.   trametinib dimethyl sulfoxide 0.5 MG tablet Commonly known as: Mekinist TAKE 2 TABLETS (1 MG TOTAL) BY MOUTH DAILY. TAKE 1 HOUR BEFORE  OR 2 HOURS AFTER A MEAL. STORE REFRIGERATED IN ORIGINAL CONTAINER. What changed:   how much to take  how to take this  when to take this  additional instructions   Vitamin D3 125 MCG (5000 UT) Caps Take 5,000 Units by mouth daily.       Contact information for after-discharge care    [facility] .   Service: Skilled Nursing Contact information: [place] [phone]                 No Known Allergies  Consultations:  ortho   Procedures/Studies: DG Chest 1 View  Result Date: 07/14/2020 CLINICAL DATA:  Slip and fall this morning. EXAM: CHEST  1 VIEW COMPARISON:  Chest CT-03/11/2020 FINDINGS: Grossly unchanged enlarged cardiac silhouette and mediastinal contours with atherosclerotic plaque within the thoracic aorta. Post median sternotomy and CABG. Redemonstrated extensive bilateral basilar predominant pulmonary nodules as better demonstrated on chest CT performed 03/11/2020. Minimal prior hilar heterogeneous opacities are unchanged favored to represent atelectasis. No new focal airspace opacities. No pleural effusion or pneumothorax. No evidence of edema. No acute osseous abnormalities. IMPRESSION: 1. No  acute cardiopulmonary disease. 2. Redemonstrated extensive bilateral basilar predominant pulmonary nodules as better demonstrated on chest CT performed 03/11/2020. Electronically Signed   By: Sandi Mariscal M.D.   On: 07/14/2020 07:34   DG Hip Unilat W or Wo Pelvis 2-3 Views Left  Result Date: 07/14/2020 CLINICAL DATA:  Slip and fall this morning, now with left hip pain. EXAM: DG HIP (WITH OR WITHOUT PELVIS) 2-3V LEFT COMPARISON:  CT abdomen and pelvis 05/21/2019 FINDINGS: There is a suspected acute nondisplaced fracture involving the greater trochanter the femur. No intra-articular extension. Left hip joint spaces appear preserved. No additional fractures are identified. Limited  visualization of the pelvis and contralateral left hip is normal. Seminal vessel and vascular calcifications overlie the pelvis bilaterally. Punctate phleboliths overlie the lower pelvis. A bone island is seen within the inter trochanteric region of the right femur. No radiopaque foreign body. IMPRESSION: Suspected acute nondisplaced fracture involving the greater trochanter of the left femur. Electronically Signed   By: Sandi Mariscal M.D.   On: 07/14/2020 07:32       Subjective: Pain is controlled, no complaints  Discharge Exam: Vitals:   07/16/20 0946 07/16/20 1402 07/16/20 2035 07/17/20 0538  BP: (!) 152/68 133/71 127/71 (!) 150/56  Pulse: 63 66 64 (!) 56  Resp:  '16 16 18  ' Temp: 97.7 F (36.5 C) 97.8 F (36.6 C) 97.6 F (36.4 C) 98.3 F (36.8 C)  TempSrc: Oral Oral Oral Oral  SpO2: 99% 97% 97% 92%  Weight:      Height:        General: Pt is alert, awake, not in acute distress Cardiovascular: RRR, S1/S2 +, no rubs, no gallops Respiratory: CTA bilaterally, no wheezing, no rhonchi Abdominal: Soft, NT, ND, bowel sounds + Extremities: no edema, no cyanosis    The results of significant diagnostics from this hospitalization (including imaging, microbiology, ancillary and laboratory) are listed below for reference.     Microbiology: Recent Results (from the past 240 hour(s))  Resp Panel by RT-PCR (Flu A&B, Covid) Nasopharyngeal Swab     Status: None   Collection Time: 07/14/20  8:17 AM   Specimen: Nasopharyngeal Swab; Nasopharyngeal(NP) swabs in vial transport medium  Result Value Ref Range Status   SARS Coronavirus 2 by RT PCR NEGATIVE NEGATIVE Final    Comment: (NOTE) SARS-CoV-2 target nucleic acids are NOT DETECTED.  The SARS-CoV-2 RNA is generally detectable in upper respiratory specimens during the acute phase of infection. The lowest concentration of SARS-CoV-2 viral copies this assay can detect is 138 copies/mL. A negative result does not preclude  SARS-Cov-2 infection and should not be used as the sole basis for treatment or other patient management decisions. A negative result may occur with  improper specimen collection/handling, submission of specimen other than nasopharyngeal swab, presence of viral mutation(s) within the areas targeted by this assay, and inadequate number of viral copies(<138 copies/mL). A negative result must be combined with clinical observations, patient history, and epidemiological information. The expected result is Negative.  Fact Sheet for Patients:  EntrepreneurPulse.com.au  Fact Sheet for Healthcare Providers:  IncredibleEmployment.be  This test is no t yet approved or cleared by the [place] FDA and  has been authorized for detection and/or diagnosis of SARS-CoV-2 by FDA under an Emergency Use Authorization (EUA). This EUA will remain  in effect (meaning this test can be used) for the duration of the COVID-19 declaration under Section 564(b)(1) of the Act, 21 U.S.C.section 360bbb-3(b)(1), unless the authorization is terminated  or revoked  sooner.       Influenza A by PCR NEGATIVE NEGATIVE Final   Influenza B by PCR NEGATIVE NEGATIVE Final    Comment: (NOTE) The Xpert Xpress SARS-CoV-2/FLU/RSV plus assay is intended as an aid in the diagnosis of influenza from Nasopharyngeal swab specimens and should not be used as a sole basis for treatment. Nasal washings and aspirates are unacceptable for Xpert Xpress SARS-CoV-2/FLU/RSV testing.  Fact Sheet for Patients: EntrepreneurPulse.com.au  Fact Sheet for Healthcare Providers: IncredibleEmployment.be  This test is not yet approved or cleared by the [place] FDA and has been authorized for detection and/or diagnosis of SARS-CoV-2 by FDA under an Emergency Use Authorization (EUA). This EUA will remain in effect (meaning this test can be used) for the duration of  the COVID-19 declaration under Section 564(b)(1) of the Act, 21 U.S.C. section 360bbb-3(b)(1), unless the authorization is terminated or revoked.  Performed at [facility], [address] [place], [place] [address]      Labs: BNP (last 3 results) No results for input(s): BNP in the last 8760 hours. Basic Metabolic Panel: Recent Labs  Lab 07/14/20 0600 07/15/20 0404  NA 133* 135  K 3.9 3.8  CL 97* 99  CO2 24 25  GLUCOSE 291* 156*  BUN 25* 23  CREATININE 1.72* 1.43*  CALCIUM 9.1 8.5*   Liver Function Tests: Recent Labs  Lab 07/14/20 0600  AST 23  ALT 17  ALKPHOS 73  BILITOT 0.9  PROT 6.9  ALBUMIN 3.5   No results for input(s): LIPASE, AMYLASE in the last 168 hours. No results for input(s): AMMONIA in the last 168 hours. CBC: Recent Labs  Lab 07/14/20 0600 07/15/20 0404  WBC 6.4 4.9  NEUTROABS 5.6  --   HGB 13.4 11.2*  HCT 39.0 33.2*  MCV 84.4 85.3  PLT 113* 107*   Cardiac Enzymes: Recent Labs  Lab 07/14/20 0600  CKTOTAL 44*   BNP: Invalid input(s): POCBNP CBG: Recent Labs  Lab 07/16/20 1115 07/16/20 1606 07/16/20 2057 07/17/20 0731 07/17/20 1105  GLUCAP 232* 220* 210* 208* 229*   D-Dimer No results for input(s): DDIMER in the last 72 hours. Hgb A1c No results for input(s): HGBA1C in the last 72 hours. Lipid Profile No results for input(s): CHOL, HDL, LDLCALC, TRIG, CHOLHDL, LDLDIRECT in the last 72 hours. Thyroid function studies No results for input(s): TSH, T4TOTAL, T3FREE, THYROIDAB in the last 72 hours.  Invalid input(s): FREET3 Anemia work up No results for input(s): VITAMINB12, FOLATE, FERRITIN, TIBC, IRON, RETICCTPCT in the last 72 hours. Urinalysis    Component Value Date/Time   COLORURINE YELLOW 07/15/2020 0609   APPEARANCEUR CLEAR 07/15/2020 0609   LABSPEC 1.020 07/15/2020 0609   PHURINE 5.0 07/15/2020 0609   GLUCOSEU >=500 (A) 07/15/2020 0609   HGBUR NEGATIVE 07/15/2020 0609   BILIRUBINUR NEGATIVE 07/15/2020 0609    KETONESUR 5 (A) 07/15/2020 0609   PROTEINUR 100 (A) 07/15/2020 0609   UROBILINOGEN 0.2 05/05/2007 1658   NITRITE NEGATIVE 07/15/2020 0609   LEUKOCYTESUR NEGATIVE 07/15/2020 0609   Sepsis Labs Invalid input(s): PROCALCITONIN,  WBC,  LACTICIDVEN Microbiology Recent Results (from the past 240 hour(s))  Resp Panel by RT-PCR (Flu A&B, Covid) Nasopharyngeal Swab     Status: None   Collection Time: 07/14/20  8:17 AM   Specimen: Nasopharyngeal Swab; Nasopharyngeal(NP) swabs in vial transport medium  Result Value Ref Range Status   SARS Coronavirus 2 by RT PCR NEGATIVE NEGATIVE Final    Comment: (NOTE) SARS-CoV-2 target nucleic acids are NOT  DETECTED.  The SARS-CoV-2 RNA is generally detectable in upper respiratory specimens during the acute phase of infection. The lowest concentration of SARS-CoV-2 viral copies this assay can detect is 138 copies/mL. A negative result does not preclude SARS-Cov-2 infection and should not be used as the sole basis for treatment or other patient management decisions. A negative result may occur with  improper specimen collection/handling, submission of specimen other than nasopharyngeal swab, presence of viral mutation(s) within the areas targeted by this assay, and inadequate number of viral copies(<138 copies/mL). A negative result must be combined with clinical observations, patient history, and epidemiological information. The expected result is Negative.  Fact Sheet for Patients:  EntrepreneurPulse.com.au  Fact Sheet for Healthcare Providers:  IncredibleEmployment.be  This test is no t yet approved or cleared by the [place] FDA and  has been authorized for detection and/or diagnosis of SARS-CoV-2 by FDA under an Emergency Use Authorization (EUA). This EUA will remain  in effect (meaning this test can be used) for the duration of the COVID-19 declaration under Section 564(b)(1) of the Act, 21 U.S.C.section  360bbb-3(b)(1), unless the authorization is terminated  or revoked sooner.       Influenza A by PCR NEGATIVE NEGATIVE Final   Influenza B by PCR NEGATIVE NEGATIVE Final    Comment: (NOTE) The Xpert Xpress SARS-CoV-2/FLU/RSV plus assay is intended as an aid in the diagnosis of influenza from Nasopharyngeal swab specimens and should not be used as a sole basis for treatment. Nasal washings and aspirates are unacceptable for Xpert Xpress SARS-CoV-2/FLU/RSV testing.  Fact Sheet for Patients: EntrepreneurPulse.com.au  Fact Sheet for Healthcare Providers: IncredibleEmployment.be  This test is not yet approved or cleared by the [place] FDA and has been authorized for detection and/or diagnosis of SARS-CoV-2 by FDA under an Emergency Use Authorization (EUA). This EUA will remain in effect (meaning this test can be used) for the duration of the COVID-19 declaration under Section 564(b)(1) of the Act, 21 U.S.C. section 360bbb-3(b)(1), unless the authorization is terminated or revoked.  Performed at [facility], [address], [place], [place] [address]      Time coordinating discharge: 79mns  SIGNED:   JKathie Dike MD  [facility] 07/17/2020, 12:39 PM   If 7PM-7AM, please contact night-coverage [email or web address]

## 2020-07-17 NOTE — Progress Notes (Signed)
Report called to [facility]

## 2020-07-22 DIAGNOSIS — R5381 Other malaise: Secondary | ICD-10-CM | POA: Diagnosis not present

## 2020-07-22 DIAGNOSIS — S72002A Fracture of unspecified part of neck of left femur, initial encounter for closed fracture: Secondary | ICD-10-CM | POA: Diagnosis not present

## 2020-07-23 ENCOUNTER — Encounter (HOSPITAL_COMMUNITY): Payer: Self-pay

## 2020-07-23 ENCOUNTER — Emergency Department (HOSPITAL_COMMUNITY)
Admission: EM | Admit: 2020-07-23 | Discharge: 2020-07-23 | Disposition: A | Discharge status: Home / Self Care | Payer: Medicare Other | Attending: Emergency Medicine | Admitting: Emergency Medicine

## 2020-07-23 ENCOUNTER — Emergency Department (HOSPITAL_COMMUNITY): Payer: Medicare Other

## 2020-07-23 ENCOUNTER — Other Ambulatory Visit: Payer: Self-pay

## 2020-07-23 DIAGNOSIS — Z79899 Other long term (current) drug therapy: Secondary | ICD-10-CM | POA: Diagnosis not present

## 2020-07-23 DIAGNOSIS — E1122 Type 2 diabetes mellitus with diabetic chronic kidney disease: Secondary | ICD-10-CM | POA: Insufficient documentation

## 2020-07-23 DIAGNOSIS — Z859 Personal history of malignant neoplasm, unspecified: Secondary | ICD-10-CM | POA: Diagnosis not present

## 2020-07-23 DIAGNOSIS — I129 Hypertensive chronic kidney disease with stage 1 through stage 4 chronic kidney disease, or unspecified chronic kidney disease: Secondary | ICD-10-CM | POA: Diagnosis not present

## 2020-07-23 DIAGNOSIS — Z87891 Personal history of nicotine dependence: Secondary | ICD-10-CM | POA: Insufficient documentation

## 2020-07-23 DIAGNOSIS — Z7984 Long term (current) use of oral hypoglycemic drugs: Secondary | ICD-10-CM | POA: Insufficient documentation

## 2020-07-23 DIAGNOSIS — N3001 Acute cystitis with hematuria: Secondary | ICD-10-CM | POA: Diagnosis not present

## 2020-07-23 DIAGNOSIS — Z951 Presence of aortocoronary bypass graft: Secondary | ICD-10-CM | POA: Insufficient documentation

## 2020-07-23 DIAGNOSIS — I2510 Atherosclerotic heart disease of native coronary artery without angina pectoris: Secondary | ICD-10-CM | POA: Diagnosis not present

## 2020-07-23 DIAGNOSIS — Z7982 Long term (current) use of aspirin: Secondary | ICD-10-CM | POA: Insufficient documentation

## 2020-07-23 DIAGNOSIS — Z743 Need for continuous supervision: Secondary | ICD-10-CM | POA: Diagnosis not present

## 2020-07-23 DIAGNOSIS — N183 Chronic kidney disease, stage 3 unspecified: Secondary | ICD-10-CM | POA: Diagnosis not present

## 2020-07-23 DIAGNOSIS — R1111 Vomiting without nausea: Secondary | ICD-10-CM | POA: Diagnosis not present

## 2020-07-23 DIAGNOSIS — Z794 Long term (current) use of insulin: Secondary | ICD-10-CM | POA: Diagnosis not present

## 2020-07-23 DIAGNOSIS — R1110 Vomiting, unspecified: Secondary | ICD-10-CM | POA: Diagnosis not present

## 2020-07-23 LAB — URINALYSIS, ROUTINE W REFLEX MICROSCOPIC
Bacteria, UA: NONE SEEN
Bilirubin Urine: NEGATIVE
Glucose, UA: >=500 mg/dL — AB
Ketones, ur: 20 mg/dL — AB
Nitrite: NEGATIVE
Protein, ur: 100 mg/dL — AB
RBC / HPF: >50 RBC/hpf — ABNORMAL HIGH (ref 0–5)
Specific Gravity, Urine: 1.017 (ref 1.005–1.030)
WBC, UA: >50 WBC/hpf — ABNORMAL HIGH (ref 0–5)
pH: 6 (ref 5.0–8.0)

## 2020-07-23 LAB — COMPREHENSIVE METABOLIC PANEL
ALT: 15 U/L (ref 0–44)
AST: 18 U/L (ref 15–41)
Albumin: 3.2 g/dL — ABNORMAL LOW (ref 3.5–5.0)
Alkaline Phosphatase: 93 U/L (ref 38–126)
Anion gap: 11 (ref 5–15)
BUN: 15 mg/dL (ref 8–23)
CO2: 24 mmol/L (ref 22–32)
Calcium: 8.8 mg/dL — ABNORMAL LOW (ref 8.9–10.3)
Chloride: 96 mmol/L — ABNORMAL LOW (ref 98–111)
Creatinine, Ser: 1.21 mg/dL (ref 0.61–1.24)
GFR, Estimated: >60 mL/min (ref >60)
Glucose, Bld: 231 mg/dL — ABNORMAL HIGH (ref 70–99)
Potassium: 3.4 mmol/L — ABNORMAL LOW (ref 3.5–5.1)
Sodium: 131 mmol/L — ABNORMAL LOW (ref 135–145)
Total Bilirubin: 1.3 mg/dL — ABNORMAL HIGH (ref 0.3–1.2)
Total Protein: 7 g/dL (ref 6.5–8.1)

## 2020-07-23 LAB — CBC WITH DIFFERENTIAL/PLATELET
Abs Immature Granulocytes: 0.03 10*3/uL (ref 0.00–0.07)
Basophils Absolute: 0 10*3/uL (ref 0.0–0.1)
Basophils Relative: 0 %
Eosinophils Absolute: 0 10*3/uL (ref 0.0–0.5)
Eosinophils Relative: 0 %
HCT: 39.1 % (ref 39.0–52.0)
Hemoglobin: 13.4 g/dL (ref 13.0–17.0)
Immature Granulocytes: 0 %
Lymphocytes Relative: 11 %
Lymphs Abs: 1 10*3/uL (ref 0.7–4.0)
MCH: 28.3 pg (ref 26.0–34.0)
MCHC: 34.3 g/dL (ref 30.0–36.0)
MCV: 82.7 fL (ref 80.0–100.0)
Monocytes Absolute: 0.6 10*3/uL (ref 0.1–1.0)
Monocytes Relative: 7 %
Neutro Abs: 7.4 10*3/uL (ref 1.7–7.7)
Neutrophils Relative %: 82 %
Platelets: 247 10*3/uL (ref 150–400)
RBC: 4.73 MIL/uL (ref 4.22–5.81)
RDW: 13.9 % (ref 11.5–15.5)
WBC: 9.1 10*3/uL (ref 4.0–10.5)
nRBC: 0 % (ref 0.0–0.2)

## 2020-07-23 LAB — LIPASE, BLOOD: Lipase: 60 U/L — ABNORMAL HIGH (ref 11–51)

## 2020-07-23 MED ORDER — SODIUM CHLORIDE 0.9 % IV BOLUS (SEPSIS)
1000.0000 mL | Freq: Once | INTRAVENOUS | Status: AC
  Administered 2020-07-23: 1000 mL via INTRAVENOUS

## 2020-07-23 MED ORDER — SODIUM CHLORIDE 0.9 % IV SOLN
1.0000 g | Freq: Once | INTRAVENOUS | Status: AC
  Administered 2020-07-23: 1 g via INTRAVENOUS
  Filled 2020-07-23: qty 10

## 2020-07-23 MED ORDER — ONDANSETRON 4 MG PO TBDP: ORAL_TABLET | ORAL | 0 refills | Status: DC

## 2020-07-23 MED ORDER — ONDANSETRON HCL 4 MG/2ML IJ SOLN
4.0000 mg | Freq: Once | INTRAMUSCULAR | Status: AC
  Administered 2020-07-23: 4 mg via INTRAVENOUS
  Filled 2020-07-23: qty 2

## 2020-07-23 MED ORDER — IOHEXOL 300 MG/ML  SOLN
100.0000 mL | Freq: Once | INTRAMUSCULAR | Status: AC | PRN
  Administered 2020-07-23: 100 mL via INTRAVENOUS

## 2020-07-23 MED ORDER — CEPHALEXIN 500 MG PO CAPS: 500.0000 mg | ORAL_CAPSULE | Freq: Four times a day (QID) | ORAL | 0 refills | Status: DC

## 2020-07-23 NOTE — ED Triage Notes (Signed)
Pt presents to ED via [facility] for vomiting all day. Pt from [facility]. CBG 239. Pt c/o dizziness x 2-3 days.

## 2020-07-23 NOTE — Discharge Instructions (Addendum)
Follow-up next week with your doctor for recheck

## 2020-07-23 NOTE — ED Provider Notes (Signed)
[facility] EMERGENCY DEPARTMENT Provider Note   CSN: [id number] Arrival date & time: 07/23/20  1825     History Chief Complaint  Patient presents with  . Emesis    [name] is a 80 y.o. male.  Patient presents with vomiting from the nursing home.  No fevers no chills  The history is provided by the patient and medical records. No language interpreter was used.  Emesis Severity:  Moderate Timing:  Constant Able to tolerate:  Liquids Progression:  Unchanged Chronicity:  New Context: not post-tussive   Relieved by:  Nothing Associated symptoms: no abdominal pain, no cough, no diarrhea and no headaches        Past Medical History:  Diagnosis Date  . Cancer ([place])   . Chronic kidney disease   . Coronary artery disease   . Diabetes mellitus   . Hypertension   . 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 Spontaneous pneumothorax 09/15/2010   right    Patient Active Problem List   Diagnosis Date Noted  . Trochanteric avulsion fracture of femur ([place]) 07/14/2020  . Chest pain 12/27/2017  . Adenocarcinoma of lung, stage 4 ([place]) 09/08/2017  . Abnormal CT of the abdomen 09/05/2017  . CKD (chronic kidney disease) stage 3, GFR 30-59 ml/min ([facility]) 06/20/2017  . Type 2 diabetes mellitus without complication, without long-term current use of insulin ([place]) 11/04/2008  . Coronary atherosclerosis 11/04/2008  . Angina pectoris ([place]) 11/04/2008  . 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 ANKLE FRACTURE SURGERY  2008   [facility]  . APPENDECTOMY  1960's  . CHEST TUBE INSERTION Right 09/15/2010   Dr Servando Snare - spontaneous PTX  . CHOLECYSTECTOMY  2008  . COLONOSCOPY N/A 09/07/2017   Procedure: COLONOSCOPY;  Surgeon: Daneil Dolin, MD;  Location: [facility] ENDO SUITE;  Service: Endoscopy;  Laterality: N/A;  10:30am  . CORONARY ARTERY  BYPASS GRAFT  2003  . EYE SURGERY  2001   Cataracts removed bilaterally  . INCISIONAL HERNIA REPAIR N/A 01/29/2015   Procedure: Fatima Blank HERNIORRHAPHY WITH MESH;  Surgeon: Aviva Signs, MD;  Location: [facility] ORS;  Service: General;  Laterality: N/A;  . INSERTION OF MESH N/A 01/29/2015   Procedure: INSERTION OF MESH;  Surgeon: Aviva Signs, MD;  Location: [facility] ORS;  Service: General;  Laterality: N/A;  . [place]  . LEFT HEART CATH AND CORS/GRAFTS ANGIOGRAPHY N/A 06/20/2017   Procedure: LEFT HEART CATH AND CORS/GRAFTS ANGIOGRAPHY;  Surgeon: Martinique, Peter M, MD;  Location: [facility] CV LAB;  Service: Cardiovascular;  Laterality: N/A;  . ORIF ANKLE FRACTURE  08/26/2011   Procedure: OPEN REDUCTION INTERNAL FIXATION (ORIF) ANKLE FRACTURE;  Surgeon: Sanjuana Kava, MD;  Location: [facility] ORS;  Service: Orthopedics;  Laterality: Right;  . POLYPECTOMY  09/07/2017   Procedure: POLYPECTOMY;  Surgeon: Daneil Dolin, MD;  Location: [facility] ENDO SUITE;  Service: Endoscopy;;  ascending x2 (cold snare)  . PROSTATE SURGERY  2012   Enlarged prostate  . TRANSURETHRAL RESECTION OF PROSTATE  2012       Family History  Problem Relation Age of Onset  . Aneurysm Mother   . Heart attack Father   . Colon cancer Maternal Uncle   . Cancer Brother   . Gastric cancer Neg Hx   .  Esophageal cancer Neg Hx     Social History   Tobacco Use  . Smoking status: Former Smoker    Packs/day: 1.00    Years: 33.00    Pack years: 33.00    Quit date: 05/17/1987    Years since quitting: 33.2  . Smokeless tobacco: Never Used  Vaping Use  . Vaping Use: Never used  Substance Use Topics  . Alcohol use: No  . Drug use: No    Home Medications Prior to Admission medications   Medication Sig Start Date End Date Taking? Authorizing Provider  amLODipine (NORVASC) 10 MG tablet Take 1 tablet (10 mg total) by mouth daily. 07/09/20  Yes Strader, Fransisco Hertz, PA-C  aspirin EC 81 MG tablet Take 1 tablet (81  mg total) by mouth in the morning and at bedtime. 07/17/20  Yes Kathie Dike, MD  cephALEXin (KEFLEX) 500 MG capsule Take 1 capsule (500 mg total) by mouth 4 (four) times daily. 07/23/20  Yes Milton Ferguson, MD  Cholecalciferol (VITAMIN D3) 5000 units CAPS Take 5,000 Units by mouth daily.   Yes [provider]  dabrafenib mesylate (TAFINLAR) 50 MG capsule TAKE 2 CAPSULES (100 MG TOTAL) BY MOUTH 2 (TWO) TIMES DAILY. TAKE ON AN EMPTY STOMACH 1 HOUR BEFORE OR 2 HOURS AFTER MEALS. Patient taking differently: Take 100 mg by mouth 2 (two) times daily. TAKE ON AN EMPTY STOMACH 1 HOUR BEFORE OR 2 HOURS AFTER MEALS. 05/19/20  Yes Derek Jack, MD  escitalopram (LEXAPRO) 10 MG tablet Take 10 mg by mouth daily.  04/03/18  Yes [provider]  glimepiride (AMARYL) 2 MG tablet Take 2 mg by mouth daily with breakfast.    Yes [provider]  insulin lispro (ADMELOG SOLOSTAR) 100 UNIT/ML KwikPen Inject 2-10 Units into the skin See admin instructions. Inject as per sliding scale: if 200-250=2 units, 251-300=4 units, 301-350=6 units, 351-400=8 units, over 400 give 10 units and notify MD, subcuatneoulsy before meals and at bedtime for dm   Yes [provider]  insulin lispro (HUMALOG) 100 UNIT/ML injection Inject 2-10 Units into the skin 3 (three) times daily before meals. Per sliding scale   Yes [provider]  isosorbide mononitrate (IMDUR) 30 MG 24 hr tablet TAKE ONE TABLET (30MG  TOTAL) BY MOUTH DAILY Patient taking differently: Take 30 mg by mouth daily. 01/09/20  Yes Strader, [place] M, PA-C  methocarbamol (ROBAXIN) 500 MG tablet Take 1 tablet (500 mg total) by mouth every 8 (eight) hours as needed for muscle spasms. 07/17/20  Yes Kathie Dike, MD  metoprolol (LOPRESSOR) 100 MG tablet Take 100 mg by mouth 2 (two) times daily.   Yes [provider]  nitroGLYCERIN (NITROSTAT) 0.4 MG SL tablet Place 0.4 mg under the tongue every 5 (five) minutes as needed for  chest pain.  06/01/17  Yes [provider]  ondansetron (ZOFRAN ODT) 4 MG disintegrating tablet 4mg  ODT q4 hours prn nausea/vomit 07/23/20  Yes Milton Ferguson, MD  ondansetron (ZOFRAN) 8 MG tablet Take 1 tablet (8 mg total) by mouth every 8 (eight) hours as needed for nausea or vomiting. 09/27/17  Yes Derek Jack, MD  oxyCODONE (OXY IR/ROXICODONE) 5 MG immediate release tablet Take 1 tablet (5 mg total) by mouth every 4 (four) hours as needed for moderate pain. 07/17/20  Yes Kathie Dike, MD  polyethylene glycol (MIRALAX / GLYCOLAX) 17 g packet Take 17 g by mouth daily as needed for mild constipation. 07/17/20  Yes Kathie Dike, MD  prochlorperazine (COMPAZINE) 10 MG  tablet Take 1 tablet (10 mg total) by mouth every 6 (six) hours as needed for nausea or vomiting. 09/27/17  Yes Derek Jack, MD  tamsulosin (FLOMAX) 0.4 MG CAPS capsule Take 1 capsule (0.4 mg total) by mouth daily after supper. 07/17/20  Yes Kathie Dike, MD  trametinib dimethyl sulfoxide (MEKINIST) 0.5 MG tablet TAKE 2 TABLETS (1 MG TOTAL) BY MOUTH DAILY. TAKE 1 HOUR BEFORE OR 2 HOURS AFTER A MEAL. STORE REFRIGERATED IN ORIGINAL CONTAINER. Patient taking differently: Take 1 mg by mouth daily. TAKE 1 HOUR BEFORE OR 2 HOURS AFTER A MEAL. STORE REFRIGERATED IN ORIGINAL CONTAINER. 05/15/20  Yes Derek Jack, MD  HM CLEARLAX 17 GM/SCOOP powder SMARTSIG:17 Gram(s) By Mouth Daily PRN Patient not taking: Reported on 07/23/2020 07/17/20   [provider]  ONE TOUCH ULTRA TEST test strip  04/17/18   [provider]    Allergies    Patient has no known allergies.  Review of Systems   Review of Systems  Constitutional: Negative for appetite change and fatigue.  HENT: Negative for congestion, ear discharge and sinus pressure.   Eyes: Negative for discharge.  Respiratory: Negative for cough.   Cardiovascular: Negative for chest pain.  Gastrointestinal: Positive for vomiting. Negative for abdominal  pain and diarrhea.  Genitourinary: Negative for frequency and hematuria.  Musculoskeletal: Negative for back pain.  Skin: Negative for rash.  Neurological: Negative for seizures and headaches.  Psychiatric/Behavioral: Negative for hallucinations.    Physical Exam Updated Vital Signs BP (!) 145/57   Pulse 81   Temp 97.7 F (36.5 C) (Oral)   Resp 14   Ht 6\' 3"  (1.905 m)   Wt 83.2 kg   SpO2 95%   BMI 22.93 kg/m   Physical Exam Vitals and nursing note reviewed.  Constitutional:      Appearance: He is well-developed.  HENT:     Head: Normocephalic.     Nose: Nose normal.  Eyes:     General: No scleral icterus.    Conjunctiva/sclera: Conjunctivae normal.  Neck:     Thyroid: No thyromegaly.  Cardiovascular:     Rate and Rhythm: Normal rate and regular rhythm.     Heart sounds: No murmur heard. No friction rub. No gallop.   Pulmonary:     Breath sounds: No stridor. No wheezing or rales.  Chest:     Chest wall: No tenderness.  Abdominal:     General: There is no distension.     Tenderness: There is no abdominal tenderness. There is no rebound.  Musculoskeletal:        General: Normal range of motion.     Cervical back: Neck supple.  Lymphadenopathy:     Cervical: No cervical adenopathy.  Skin:    Findings: No erythema or rash.  Neurological:     Mental Status: He is alert and oriented to person, place, and time.     Motor: No abnormal muscle tone.     Coordination: Coordination normal.  Psychiatric:        Behavior: Behavior normal.     ED Results / Procedures / Treatments   Labs (all labs ordered are listed, but only abnormal results are displayed) Labs Reviewed  COMPREHENSIVE METABOLIC PANEL - Abnormal; Notable for the following components:      Result Value   Sodium 131 (*)    Potassium 3.4 (*)    Chloride 96 (*)    Glucose, Bld 231 (*)    Calcium 8.8 (*)    Albumin  3.2 (*)    Total Bilirubin 1.3 (*)    All other components within normal limits   LIPASE, BLOOD - Abnormal; Notable for the following components:   Lipase 60 (*)    All other components within normal limits  URINALYSIS, ROUTINE W REFLEX MICROSCOPIC - Abnormal; Notable for the following components:   APPearance HAZY (*)    Glucose, UA >=500 (*)    Hgb urine dipstick LARGE (*)    Ketones, ur 20 (*)    Protein, ur 100 (*)    Leukocytes,Ua LARGE (*)    RBC / HPF >50 (*)    WBC, UA >50 (*)    All other components within normal limits  URINE CULTURE  CBC WITH DIFFERENTIAL/PLATELET    EKG None  Radiology CT ABDOMEN PELVIS W CONTRAST  Result Date: 07/23/2020 CLINICAL DATA:  Nausea and vomiting. EXAM: CT ABDOMEN AND PELVIS WITH CONTRAST TECHNIQUE: Multidetector CT imaging of the abdomen and pelvis was performed using the standard protocol following bolus administration of intravenous contrast. CONTRAST:  178mL OMNIPAQUE IOHEXOL 300 MG/ML  SOLN COMPARISON:  May 21, 2019 FINDINGS: Lower chest: Multiple sternal wires are seen. Innumerable, stable subcentimeter noncalcified lung nodules are seen within the bilateral lung bases. Hepatobiliary: No focal liver abnormality is seen. Status post cholecystectomy. No biliary dilatation. Pancreas: Unremarkable. No pancreatic ductal dilatation or surrounding inflammatory changes. Spleen: Normal in size without focal abnormality. Adrenals/Urinary Tract: Adrenal glands are unremarkable. Kidneys are normal in size. A 9 mm x 8 mm cystic appearing area is seen within the posterolateral aspect of the mid right kidney. A similar appearing 10 mm x 8 mm focus of low attenuation is seen within the posterior aspect of the mid left kidney. This is decreased in size when compared to the prior exam. There is mild, stable right-sided hydronephrosis and hydroureter, without evidence of renal calculi. Bladder is unremarkable. Stomach/Bowel: Stomach is within normal limits. The appendix is surgically absent. No evidence of bowel wall thickening, distention, or  inflammatory changes. Vascular/Lymphatic: Aortic atherosclerosis. No enlarged abdominal or pelvic lymph nodes. Reproductive: The prostate gland is heterogeneous in appearance. Other: No abdominal wall hernia or abnormality. No abdominopelvic ascites. Musculoskeletal: Degenerative changes seen throughout the lumbar spine. IMPRESSION: 1. 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 2. Evidence of prior cholecystectomy. 3. Aortic atherosclerosis. Aortic Atherosclerosis (ICD10-I70.0). Electronically Signed   By: Virgina Norfolk M.D.   On: 07/23/2020 20:55    Procedures Procedures   Medications Ordered in ED Medications  cefTRIAXone (ROCEPHIN) 1 g in sodium chloride 0.9 % 100 mL IVPB (has no administration in time range)  sodium chloride 0.9 % bolus 1,000 mL (0 mLs Intravenous Stopped 07/23/20 1939)  ondansetron (ZOFRAN) injection 4 mg (4 mg Intravenous Given 07/23/20 1908)  iohexol (OMNIPAQUE) 300 MG/ML solution 100 mL (100 mLs Intravenous Contrast Given 07/23/20 2023)    ED Course  I have reviewed the triage vital signs and the nursing notes.  Pertinent labs & imaging results that were available during my care of the patient were reviewed by me and considered in my medical decision making (see chart for details).    MDM Rules/Calculators/A&P                          Patient with urinary tract infection.  Along with some vomiting.  He is given fluids and a dose of Rocephin and will be sent home with Zofran and  Keflex.  Patient is nontoxic Final Clinical Impression(s) / ED Diagnoses Final diagnoses:  Acute cystitis with hematuria    Rx / DC Orders ED Discharge Orders         Ordered    cephALEXin (KEFLEX) 500 MG capsule  4 times daily        07/23/20 2206    ondansetron (ZOFRAN ODT) 4 MG disintegrating tablet        07/23/20 2206           Milton Ferguson, MD 07/24/20 1000

## 2020-07-24 DIAGNOSIS — N183 Chronic kidney disease, stage 3 unspecified: Secondary | ICD-10-CM | POA: Diagnosis not present

## 2020-07-24 DIAGNOSIS — E119 Type 2 diabetes mellitus without complications: Secondary | ICD-10-CM | POA: Diagnosis not present

## 2020-07-24 DIAGNOSIS — I10 Essential (primary) hypertension: Secondary | ICD-10-CM | POA: Diagnosis not present

## 2020-07-26 LAB — URINE CULTURE: Culture: >=100000 — AB

## 2020-07-30 ENCOUNTER — Ambulatory Visit: Payer: Medicare Other

## 2020-07-30 ENCOUNTER — Encounter: Payer: Self-pay | Admitting: Orthopedic Surgery

## 2020-07-30 ENCOUNTER — Other Ambulatory Visit: Payer: Self-pay

## 2020-07-30 ENCOUNTER — Ambulatory Visit (INDEPENDENT_AMBULATORY_CARE_PROVIDER_SITE_OTHER): Payer: Medicare Other | Admitting: Orthopedic Surgery

## 2020-07-30 VITALS — BP 119/69 | HR 75 | Ht 75.0 in | Wt 183.0 lb

## 2020-07-30 DIAGNOSIS — S72102D Unspecified trochanteric fracture of left femur, subsequent encounter for closed fracture with routine healing: Secondary | ICD-10-CM

## 2020-07-30 DIAGNOSIS — S72102A Unspecified trochanteric fracture of left femur, initial encounter for closed fracture: Secondary | ICD-10-CM

## 2020-07-30 NOTE — Progress Notes (Signed)
Orthopaedic Clinic Return  Assessment: [name] is a 80 y.o. male with the following: Minimally displaced left greater trochanter fracture  Plan: Repeat radiographs without significant displacement.  Continue with WBAT LLE.  Limit active hip abduction.  Work with PT, recommend continued ambulation using a walker.  Follow up 6 weeks with repeat XR  Follow-up: Return in about 6 weeks (around 09/10/2020). [facility] pelvis and left hip x-rays  Subjective:  Chief Complaint  Patient presents with  . Fracture    Fx care DOI 07/21/20    History of Present Illness: [name] is a 80 y.o. male who presents to clinic today for repeat evaluation of a left greater trochanter fracture.  I saw him as a consult in the hospital approximately 2 weeks ago.  Since then, he has been discharged, and has remained in a skilled nursing facility.  Overall, his hip has remained stable.  He still has some pain, but this is tolerable.  He is currently struggling with his general health.  He was recently diagnosed with a UTI, and is finishing appropriate antibiotics.  According to his wife, he has been nauseous since starting the antibiotics.  In addition, he has not been working well with physical therapy due to his overall health.  Review of Systems: No fevers or chills No numbness or tingling No chest pain No shortness of breath No bowel or bladder dysfunction No GI distress No headaches +nausea and vomiting  Objective: BP 119/69   Pulse 75   Ht 6\' 3"  (1.905 m)   Wt 183 lb (83 kg)   BMI 22.87 kg/m   Physical Exam:  Ill-appearing elderly male, seated in a wheelchair.  Alert and oriented.  He is able to maintain a straight leg raise.  Minimal pain with axial loading.  He tolerates gentle range of motion of his left hip.  Active dorsiflexion of his ankle and great toe.  Sensation is intact distally.  He does have tenderness to palpation over the lateral hip.  IMAGING: I personally ordered and  reviewed the following images:   X-rays of the pelvis, and the left hip demonstrates a minimally displaced greater trochanter fracture.  There has  been no significant displacement since the injury films.  Left hip remains reduced.  Mild degenerative changes with maintenance of the joint space.  Small osteophytes are appreciated.  Impression: Minimally displaced left greater trochanter fracture in acceptable alignment  Mordecai Rasmussen, MD 07/30/2020 5:41 PM

## 2020-07-31 DIAGNOSIS — N183 Chronic kidney disease, stage 3 unspecified: Secondary | ICD-10-CM | POA: Diagnosis not present

## 2020-07-31 DIAGNOSIS — D649 Anemia, unspecified: Secondary | ICD-10-CM | POA: Diagnosis not present

## 2020-07-31 DIAGNOSIS — I10 Essential (primary) hypertension: Secondary | ICD-10-CM | POA: Diagnosis not present

## 2020-07-31 DIAGNOSIS — E119 Type 2 diabetes mellitus without complications: Secondary | ICD-10-CM | POA: Diagnosis not present

## 2020-07-31 DIAGNOSIS — S72002A Fracture of unspecified part of neck of left femur, initial encounter for closed fracture: Secondary | ICD-10-CM | POA: Diagnosis not present

## 2020-07-31 DIAGNOSIS — R5381 Other malaise: Secondary | ICD-10-CM | POA: Diagnosis not present

## 2020-08-13 DIAGNOSIS — C3490 Malignant neoplasm of unspecified part of unspecified bronchus or lung: Secondary | ICD-10-CM | POA: Diagnosis not present

## 2020-08-13 DIAGNOSIS — D649 Anemia, unspecified: Secondary | ICD-10-CM | POA: Diagnosis not present

## 2020-08-13 DIAGNOSIS — N1832 Chronic kidney disease, stage 3b: Secondary | ICD-10-CM | POA: Diagnosis not present

## 2020-08-13 DIAGNOSIS — I129 Hypertensive chronic kidney disease with stage 1 through stage 4 chronic kidney disease, or unspecified chronic kidney disease: Secondary | ICD-10-CM | POA: Diagnosis not present

## 2020-08-13 DIAGNOSIS — S72145A Nondisplaced intertrochanteric fracture of left femur, initial encounter for closed fracture: Secondary | ICD-10-CM | POA: Diagnosis not present

## 2020-08-13 DIAGNOSIS — Z7984 Long term (current) use of oral hypoglycemic drugs: Secondary | ICD-10-CM | POA: Diagnosis not present

## 2020-08-13 DIAGNOSIS — E119 Type 2 diabetes mellitus without complications: Secondary | ICD-10-CM | POA: Diagnosis not present

## 2020-08-13 DIAGNOSIS — Z794 Long term (current) use of insulin: Secondary | ICD-10-CM | POA: Diagnosis not present

## 2020-08-13 DIAGNOSIS — Z951 Presence of aortocoronary bypass graft: Secondary | ICD-10-CM | POA: Diagnosis not present

## 2020-08-13 DIAGNOSIS — Z79899 Other long term (current) drug therapy: Secondary | ICD-10-CM | POA: Diagnosis not present

## 2020-08-14 DIAGNOSIS — E119 Type 2 diabetes mellitus without complications: Secondary | ICD-10-CM | POA: Diagnosis not present

## 2020-08-14 DIAGNOSIS — I10 Essential (primary) hypertension: Secondary | ICD-10-CM | POA: Diagnosis not present

## 2020-08-14 DIAGNOSIS — E785 Hyperlipidemia, unspecified: Secondary | ICD-10-CM | POA: Diagnosis not present

## 2020-08-14 DIAGNOSIS — N183 Chronic kidney disease, stage 3 unspecified: Secondary | ICD-10-CM | POA: Diagnosis not present

## 2020-08-15 DIAGNOSIS — I129 Hypertensive chronic kidney disease with stage 1 through stage 4 chronic kidney disease, or unspecified chronic kidney disease: Secondary | ICD-10-CM | POA: Diagnosis not present

## 2020-08-15 DIAGNOSIS — S72145A Nondisplaced intertrochanteric fracture of left femur, initial encounter for closed fracture: Secondary | ICD-10-CM | POA: Diagnosis not present

## 2020-08-15 DIAGNOSIS — Z7984 Long term (current) use of oral hypoglycemic drugs: Secondary | ICD-10-CM | POA: Diagnosis not present

## 2020-08-15 DIAGNOSIS — N1832 Chronic kidney disease, stage 3b: Secondary | ICD-10-CM | POA: Diagnosis not present

## 2020-08-15 DIAGNOSIS — C3490 Malignant neoplasm of unspecified part of unspecified bronchus or lung: Secondary | ICD-10-CM | POA: Diagnosis not present

## 2020-08-15 DIAGNOSIS — Z794 Long term (current) use of insulin: Secondary | ICD-10-CM | POA: Diagnosis not present

## 2020-08-15 DIAGNOSIS — D649 Anemia, unspecified: Secondary | ICD-10-CM | POA: Diagnosis not present

## 2020-08-15 DIAGNOSIS — Z79899 Other long term (current) drug therapy: Secondary | ICD-10-CM | POA: Diagnosis not present

## 2020-08-15 DIAGNOSIS — Z951 Presence of aortocoronary bypass graft: Secondary | ICD-10-CM | POA: Diagnosis not present

## 2020-08-15 DIAGNOSIS — E119 Type 2 diabetes mellitus without complications: Secondary | ICD-10-CM | POA: Diagnosis not present

## 2020-08-20 DIAGNOSIS — S72145A Nondisplaced intertrochanteric fracture of left femur, initial encounter for closed fracture: Secondary | ICD-10-CM | POA: Diagnosis not present

## 2020-08-20 DIAGNOSIS — I129 Hypertensive chronic kidney disease with stage 1 through stage 4 chronic kidney disease, or unspecified chronic kidney disease: Secondary | ICD-10-CM | POA: Diagnosis not present

## 2020-08-20 DIAGNOSIS — Z79899 Other long term (current) drug therapy: Secondary | ICD-10-CM | POA: Diagnosis not present

## 2020-08-20 DIAGNOSIS — D649 Anemia, unspecified: Secondary | ICD-10-CM | POA: Diagnosis not present

## 2020-08-20 DIAGNOSIS — Z794 Long term (current) use of insulin: Secondary | ICD-10-CM | POA: Diagnosis not present

## 2020-08-20 DIAGNOSIS — E119 Type 2 diabetes mellitus without complications: Secondary | ICD-10-CM | POA: Diagnosis not present

## 2020-08-20 DIAGNOSIS — C3490 Malignant neoplasm of unspecified part of unspecified bronchus or lung: Secondary | ICD-10-CM | POA: Diagnosis not present

## 2020-08-20 DIAGNOSIS — Z951 Presence of aortocoronary bypass graft: Secondary | ICD-10-CM | POA: Diagnosis not present

## 2020-08-20 DIAGNOSIS — N1832 Chronic kidney disease, stage 3b: Secondary | ICD-10-CM | POA: Diagnosis not present

## 2020-08-20 DIAGNOSIS — Z7984 Long term (current) use of oral hypoglycemic drugs: Secondary | ICD-10-CM | POA: Diagnosis not present

## 2020-08-22 DIAGNOSIS — N1832 Chronic kidney disease, stage 3b: Secondary | ICD-10-CM | POA: Diagnosis not present

## 2020-08-22 DIAGNOSIS — S72145A Nondisplaced intertrochanteric fracture of left femur, initial encounter for closed fracture: Secondary | ICD-10-CM | POA: Diagnosis not present

## 2020-08-22 DIAGNOSIS — Z951 Presence of aortocoronary bypass graft: Secondary | ICD-10-CM | POA: Diagnosis not present

## 2020-08-22 DIAGNOSIS — Z79899 Other long term (current) drug therapy: Secondary | ICD-10-CM | POA: Diagnosis not present

## 2020-08-22 DIAGNOSIS — I129 Hypertensive chronic kidney disease with stage 1 through stage 4 chronic kidney disease, or unspecified chronic kidney disease: Secondary | ICD-10-CM | POA: Diagnosis not present

## 2020-08-22 DIAGNOSIS — Z794 Long term (current) use of insulin: Secondary | ICD-10-CM | POA: Diagnosis not present

## 2020-08-22 DIAGNOSIS — Z7984 Long term (current) use of oral hypoglycemic drugs: Secondary | ICD-10-CM | POA: Diagnosis not present

## 2020-08-22 DIAGNOSIS — E119 Type 2 diabetes mellitus without complications: Secondary | ICD-10-CM | POA: Diagnosis not present

## 2020-08-22 DIAGNOSIS — D649 Anemia, unspecified: Secondary | ICD-10-CM | POA: Diagnosis not present

## 2020-08-22 DIAGNOSIS — C3490 Malignant neoplasm of unspecified part of unspecified bronchus or lung: Secondary | ICD-10-CM | POA: Diagnosis not present

## 2020-08-25 DIAGNOSIS — S72145A Nondisplaced intertrochanteric fracture of left femur, initial encounter for closed fracture: Secondary | ICD-10-CM | POA: Diagnosis not present

## 2020-08-25 DIAGNOSIS — N1832 Chronic kidney disease, stage 3b: Secondary | ICD-10-CM | POA: Diagnosis not present

## 2020-08-25 DIAGNOSIS — Z7984 Long term (current) use of oral hypoglycemic drugs: Secondary | ICD-10-CM | POA: Diagnosis not present

## 2020-08-25 DIAGNOSIS — Z79899 Other long term (current) drug therapy: Secondary | ICD-10-CM | POA: Diagnosis not present

## 2020-08-25 DIAGNOSIS — C3490 Malignant neoplasm of unspecified part of unspecified bronchus or lung: Secondary | ICD-10-CM | POA: Diagnosis not present

## 2020-08-25 DIAGNOSIS — I129 Hypertensive chronic kidney disease with stage 1 through stage 4 chronic kidney disease, or unspecified chronic kidney disease: Secondary | ICD-10-CM | POA: Diagnosis not present

## 2020-08-25 DIAGNOSIS — Z951 Presence of aortocoronary bypass graft: Secondary | ICD-10-CM | POA: Diagnosis not present

## 2020-08-25 DIAGNOSIS — Z794 Long term (current) use of insulin: Secondary | ICD-10-CM | POA: Diagnosis not present

## 2020-08-25 DIAGNOSIS — E119 Type 2 diabetes mellitus without complications: Secondary | ICD-10-CM | POA: Diagnosis not present

## 2020-08-25 DIAGNOSIS — D649 Anemia, unspecified: Secondary | ICD-10-CM | POA: Diagnosis not present

## 2020-08-27 DIAGNOSIS — Z7984 Long term (current) use of oral hypoglycemic drugs: Secondary | ICD-10-CM | POA: Diagnosis not present

## 2020-08-27 DIAGNOSIS — S72145A Nondisplaced intertrochanteric fracture of left femur, initial encounter for closed fracture: Secondary | ICD-10-CM | POA: Diagnosis not present

## 2020-08-27 DIAGNOSIS — N1832 Chronic kidney disease, stage 3b: Secondary | ICD-10-CM | POA: Diagnosis not present

## 2020-08-27 DIAGNOSIS — Z794 Long term (current) use of insulin: Secondary | ICD-10-CM | POA: Diagnosis not present

## 2020-08-27 DIAGNOSIS — Z951 Presence of aortocoronary bypass graft: Secondary | ICD-10-CM | POA: Diagnosis not present

## 2020-08-27 DIAGNOSIS — D649 Anemia, unspecified: Secondary | ICD-10-CM | POA: Diagnosis not present

## 2020-08-27 DIAGNOSIS — C3490 Malignant neoplasm of unspecified part of unspecified bronchus or lung: Secondary | ICD-10-CM | POA: Diagnosis not present

## 2020-08-27 DIAGNOSIS — E119 Type 2 diabetes mellitus without complications: Secondary | ICD-10-CM | POA: Diagnosis not present

## 2020-08-27 DIAGNOSIS — Z79899 Other long term (current) drug therapy: Secondary | ICD-10-CM | POA: Diagnosis not present

## 2020-08-27 DIAGNOSIS — I129 Hypertensive chronic kidney disease with stage 1 through stage 4 chronic kidney disease, or unspecified chronic kidney disease: Secondary | ICD-10-CM | POA: Diagnosis not present

## 2020-09-03 DIAGNOSIS — Z951 Presence of aortocoronary bypass graft: Secondary | ICD-10-CM | POA: Diagnosis not present

## 2020-09-03 DIAGNOSIS — Z79899 Other long term (current) drug therapy: Secondary | ICD-10-CM | POA: Diagnosis not present

## 2020-09-03 DIAGNOSIS — N1832 Chronic kidney disease, stage 3b: Secondary | ICD-10-CM | POA: Diagnosis not present

## 2020-09-03 DIAGNOSIS — C3490 Malignant neoplasm of unspecified part of unspecified bronchus or lung: Secondary | ICD-10-CM | POA: Diagnosis not present

## 2020-09-03 DIAGNOSIS — I129 Hypertensive chronic kidney disease with stage 1 through stage 4 chronic kidney disease, or unspecified chronic kidney disease: Secondary | ICD-10-CM | POA: Diagnosis not present

## 2020-09-03 DIAGNOSIS — S72145A Nondisplaced intertrochanteric fracture of left femur, initial encounter for closed fracture: Secondary | ICD-10-CM | POA: Diagnosis not present

## 2020-09-03 DIAGNOSIS — Z794 Long term (current) use of insulin: Secondary | ICD-10-CM | POA: Diagnosis not present

## 2020-09-03 DIAGNOSIS — D649 Anemia, unspecified: Secondary | ICD-10-CM | POA: Diagnosis not present

## 2020-09-03 DIAGNOSIS — E119 Type 2 diabetes mellitus without complications: Secondary | ICD-10-CM | POA: Diagnosis not present

## 2020-09-03 DIAGNOSIS — Z7984 Long term (current) use of oral hypoglycemic drugs: Secondary | ICD-10-CM | POA: Diagnosis not present

## 2020-09-04 ENCOUNTER — Other Ambulatory Visit (HOSPITAL_COMMUNITY): Payer: Self-pay | Admitting: *Deleted

## 2020-09-04 DIAGNOSIS — C3490 Malignant neoplasm of unspecified part of unspecified bronchus or lung: Secondary | ICD-10-CM

## 2020-09-04 MED ORDER — TRAMETINIB DIMETHYL SULFOXIDE 0.5 MG PO TABS: ORAL_TABLET | ORAL | 3 refills | Status: DC

## 2020-09-04 MED ORDER — DABRAFENIB MESYLATE 50 MG PO CAPS: ORAL_CAPSULE | ORAL | 3 refills | Status: DC

## 2020-09-05 ENCOUNTER — Other Ambulatory Visit (HOSPITAL_COMMUNITY): Payer: Self-pay | Admitting: *Deleted

## 2020-09-05 DIAGNOSIS — Z79899 Other long term (current) drug therapy: Secondary | ICD-10-CM | POA: Diagnosis not present

## 2020-09-05 DIAGNOSIS — Z7984 Long term (current) use of oral hypoglycemic drugs: Secondary | ICD-10-CM | POA: Diagnosis not present

## 2020-09-05 DIAGNOSIS — Z951 Presence of aortocoronary bypass graft: Secondary | ICD-10-CM | POA: Diagnosis not present

## 2020-09-05 DIAGNOSIS — N1832 Chronic kidney disease, stage 3b: Secondary | ICD-10-CM | POA: Diagnosis not present

## 2020-09-05 DIAGNOSIS — C3490 Malignant neoplasm of unspecified part of unspecified bronchus or lung: Secondary | ICD-10-CM

## 2020-09-05 DIAGNOSIS — Z794 Long term (current) use of insulin: Secondary | ICD-10-CM | POA: Diagnosis not present

## 2020-09-05 DIAGNOSIS — E119 Type 2 diabetes mellitus without complications: Secondary | ICD-10-CM | POA: Diagnosis not present

## 2020-09-05 DIAGNOSIS — S72145A Nondisplaced intertrochanteric fracture of left femur, initial encounter for closed fracture: Secondary | ICD-10-CM | POA: Diagnosis not present

## 2020-09-05 DIAGNOSIS — D649 Anemia, unspecified: Secondary | ICD-10-CM | POA: Diagnosis not present

## 2020-09-05 DIAGNOSIS — I129 Hypertensive chronic kidney disease with stage 1 through stage 4 chronic kidney disease, or unspecified chronic kidney disease: Secondary | ICD-10-CM | POA: Diagnosis not present

## 2020-09-08 ENCOUNTER — Inpatient Hospital Stay (HOSPITAL_COMMUNITY): Payer: Medicare Other | Attending: Hematology

## 2020-09-08 ENCOUNTER — Other Ambulatory Visit: Payer: Self-pay

## 2020-09-08 ENCOUNTER — Ambulatory Visit (HOSPITAL_COMMUNITY)
Admission: RE | Admit: 2020-09-08 | Discharge: 2020-09-08 | Disposition: A | Discharge status: Home / Self Care | Payer: Medicare Other | Source: Ambulatory Visit | Attending: Hematology | Admitting: Hematology

## 2020-09-08 DIAGNOSIS — I517 Cardiomegaly: Secondary | ICD-10-CM | POA: Diagnosis not present

## 2020-09-08 DIAGNOSIS — J439 Emphysema, unspecified: Secondary | ICD-10-CM | POA: Diagnosis not present

## 2020-09-08 DIAGNOSIS — Z87891 Personal history of nicotine dependence: Secondary | ICD-10-CM | POA: Insufficient documentation

## 2020-09-08 DIAGNOSIS — I2510 Atherosclerotic heart disease of native coronary artery without angina pectoris: Secondary | ICD-10-CM | POA: Diagnosis not present

## 2020-09-08 DIAGNOSIS — C3431 Malignant neoplasm of lower lobe, right bronchus or lung: Secondary | ICD-10-CM | POA: Diagnosis not present

## 2020-09-08 DIAGNOSIS — C3490 Malignant neoplasm of unspecified part of unspecified bronchus or lung: Secondary | ICD-10-CM | POA: Diagnosis not present

## 2020-09-08 LAB — CBC WITH DIFFERENTIAL/PLATELET
Abs Immature Granulocytes: 0.03 10*3/uL (ref 0.00–0.07)
Basophils Absolute: 0 10*3/uL (ref 0.0–0.1)
Basophils Relative: 0 %
Eosinophils Absolute: 0.1 10*3/uL (ref 0.0–0.5)
Eosinophils Relative: 2 %
HCT: 34.9 % — ABNORMAL LOW (ref 39.0–52.0)
Hemoglobin: 11.7 g/dL — ABNORMAL LOW (ref 13.0–17.0)
Immature Granulocytes: 0 %
Lymphocytes Relative: 20 %
Lymphs Abs: 1.5 10*3/uL (ref 0.7–4.0)
MCH: 28.7 pg (ref 26.0–34.0)
MCHC: 33.5 g/dL (ref 30.0–36.0)
MCV: 85.7 fL (ref 80.0–100.0)
Monocytes Absolute: 0.6 10*3/uL (ref 0.1–1.0)
Monocytes Relative: 8 %
Neutro Abs: 5.5 10*3/uL (ref 1.7–7.7)
Neutrophils Relative %: 70 %
Platelets: 260 10*3/uL (ref 150–400)
RBC: 4.07 MIL/uL — ABNORMAL LOW (ref 4.22–5.81)
RDW: 14.6 % (ref 11.5–15.5)
WBC: 7.8 10*3/uL (ref 4.0–10.5)
nRBC: 0 % (ref 0.0–0.2)

## 2020-09-08 LAB — COMPREHENSIVE METABOLIC PANEL
ALT: 16 U/L (ref 0–44)
AST: 21 U/L (ref 15–41)
Albumin: 3.1 g/dL — ABNORMAL LOW (ref 3.5–5.0)
Alkaline Phosphatase: 119 U/L (ref 38–126)
Anion gap: 8 (ref 5–15)
BUN: 18 mg/dL (ref 8–23)
CO2: 26 mmol/L (ref 22–32)
Calcium: 8.9 mg/dL (ref 8.9–10.3)
Chloride: 96 mmol/L — ABNORMAL LOW (ref 98–111)
Creatinine, Ser: 1.39 mg/dL — ABNORMAL HIGH (ref 0.61–1.24)
GFR, Estimated: 51 mL/min — ABNORMAL LOW (ref >60)
Glucose, Bld: 287 mg/dL — ABNORMAL HIGH (ref 70–99)
Potassium: 4.1 mmol/L (ref 3.5–5.1)
Sodium: 130 mmol/L — ABNORMAL LOW (ref 135–145)
Total Bilirubin: 0.7 mg/dL (ref 0.3–1.2)
Total Protein: 6.7 g/dL (ref 6.5–8.1)

## 2020-09-08 MED ORDER — IOHEXOL 300 MG/ML  SOLN
75.0000 mL | Freq: Once | INTRAMUSCULAR | Status: AC | PRN
  Administered 2020-09-08: 75 mL via INTRAVENOUS

## 2020-09-09 ENCOUNTER — Other Ambulatory Visit (HOSPITAL_COMMUNITY): Payer: Self-pay

## 2020-09-09 DIAGNOSIS — N1832 Chronic kidney disease, stage 3b: Secondary | ICD-10-CM | POA: Diagnosis not present

## 2020-09-09 DIAGNOSIS — C3490 Malignant neoplasm of unspecified part of unspecified bronchus or lung: Secondary | ICD-10-CM

## 2020-09-09 DIAGNOSIS — E119 Type 2 diabetes mellitus without complications: Secondary | ICD-10-CM | POA: Diagnosis not present

## 2020-09-09 DIAGNOSIS — Z951 Presence of aortocoronary bypass graft: Secondary | ICD-10-CM | POA: Diagnosis not present

## 2020-09-09 DIAGNOSIS — I129 Hypertensive chronic kidney disease with stage 1 through stage 4 chronic kidney disease, or unspecified chronic kidney disease: Secondary | ICD-10-CM | POA: Diagnosis not present

## 2020-09-09 DIAGNOSIS — S72145A Nondisplaced intertrochanteric fracture of left femur, initial encounter for closed fracture: Secondary | ICD-10-CM | POA: Diagnosis not present

## 2020-09-09 DIAGNOSIS — Z794 Long term (current) use of insulin: Secondary | ICD-10-CM | POA: Diagnosis not present

## 2020-09-09 DIAGNOSIS — D649 Anemia, unspecified: Secondary | ICD-10-CM | POA: Diagnosis not present

## 2020-09-09 DIAGNOSIS — Z79899 Other long term (current) drug therapy: Secondary | ICD-10-CM | POA: Diagnosis not present

## 2020-09-09 DIAGNOSIS — Z7984 Long term (current) use of oral hypoglycemic drugs: Secondary | ICD-10-CM | POA: Diagnosis not present

## 2020-09-09 MED ORDER — DABRAFENIB MESYLATE 50 MG PO CAPS: ORAL_CAPSULE | ORAL | 3 refills | Status: DC

## 2020-09-09 NOTE — Telephone Encounter (Signed)
Chart reviewed. Tafinlar refilled per Dr. Delton Coombes

## 2020-09-10 ENCOUNTER — Encounter: Payer: Self-pay | Admitting: Orthopedic Surgery

## 2020-09-10 ENCOUNTER — Ambulatory Visit (INDEPENDENT_AMBULATORY_CARE_PROVIDER_SITE_OTHER): Payer: Medicare Other | Admitting: Orthopedic Surgery

## 2020-09-10 ENCOUNTER — Other Ambulatory Visit: Payer: Self-pay

## 2020-09-10 ENCOUNTER — Ambulatory Visit: Payer: Medicare Other

## 2020-09-10 VITALS — BP 130/67 | HR 61 | Ht 75.0 in | Wt 178.0 lb

## 2020-09-10 DIAGNOSIS — S72102D Unspecified trochanteric fracture of left femur, subsequent encounter for closed fracture with routine healing: Secondary | ICD-10-CM | POA: Diagnosis not present

## 2020-09-10 DIAGNOSIS — M25562 Pain in left knee: Secondary | ICD-10-CM

## 2020-09-10 DIAGNOSIS — M1712 Unilateral primary osteoarthritis, left knee: Secondary | ICD-10-CM | POA: Diagnosis not present

## 2020-09-10 NOTE — Progress Notes (Signed)
Orthopaedic Clinic Return  Assessment: [name] is a 80 y.o. male with the following: Minimally displaced left greater trochanter fracture  Plan: Repeat radiographs today demonstrate interval consolidation of the greater trochanter fracture.  No interval displacement. On exam, he has no pain with resisted hip abduction.  No TTP over the lateral hip.  In addition, left knee XR without acute injury.  Mild to moderate degenerative changes in knee, most significant within the medial compartment.  Discussed a steroid injection if painful, but he declines; pain not bad enough right now.   No restrictions on activity.  Recommend continued use of assistive device.  Return with issues, or interested in a left knee injection.    Follow-up: Return if symptoms worsen or fail to improve. [facility] pelvis and left hip x-rays  Subjective:  Chief Complaint  Patient presents with  . Fracture    Lt femur Fx DOI 07/14/20    History of Present Illness: [name] is a 79 y.o. male who presents to clinic today for repeat evaluation of a left greater trochanter fracture.  Injury was sustained 2 months ago.  He is now at home, and improving well.  His pain is better.  He is ambulating well with a cane.  He has been having some left knee pain since the fall.  He is not taking medications on a consistent basis.   Review of Systems: No fevers or chills No numbness or tingling No chest pain No shortness of breath No bowel or bladder dysfunction No GI distress No headaches +nausea and vomiting  Objective: BP 130/67   Pulse 61   Ht 6\' 3"  (1.905 m)   Wt 178 lb (80.7 kg)   BMI 22.25 kg/m   Physical Exam:  Alert and oriented.  No acute distress.   Ambulates with the assistance of a cane.  Left hip without bruising.  No TTP over the lateral hip.  No pain with resisted hip abduction.  Can maintain a straight leg raise.  Tenderness over the medial joint line of the knee.  No crepitus.  Full ROM.     IMAGING: I personally ordered and reviewed the following images:   X-rays of the pelvis, and the left hip demonstrates a minimally displaced greater trochanter fracture.  Interval consolidation.  No acute injury.  Degenerative changes within the hip joint.   Impression: Healing left greater trochanter fracture  XR of the left knee demonstrates mild varus alignment.  Loss of medial joint space with some remaining space.  No significant osteophytes.    Impression: Mild left knee arthritis.    Anthony Rasmussen, MD 09/10/2020 10:02 AM

## 2020-09-15 ENCOUNTER — Inpatient Hospital Stay (HOSPITAL_COMMUNITY): Payer: Medicare Other | Attending: Hematology | Admitting: Hematology

## 2020-09-15 ENCOUNTER — Other Ambulatory Visit: Payer: Self-pay

## 2020-09-15 VITALS — BP 135/75 | HR 65 | Temp 97.0°F | Resp 20 | Wt 182.3 lb

## 2020-09-15 DIAGNOSIS — Z87891 Personal history of nicotine dependence: Secondary | ICD-10-CM | POA: Insufficient documentation

## 2020-09-15 DIAGNOSIS — Z79899 Other long term (current) drug therapy: Secondary | ICD-10-CM | POA: Diagnosis not present

## 2020-09-15 DIAGNOSIS — R609 Edema, unspecified: Secondary | ICD-10-CM | POA: Diagnosis not present

## 2020-09-15 DIAGNOSIS — C3490 Malignant neoplasm of unspecified part of unspecified bronchus or lung: Secondary | ICD-10-CM | POA: Diagnosis not present

## 2020-09-15 DIAGNOSIS — D649 Anemia, unspecified: Secondary | ICD-10-CM | POA: Insufficient documentation

## 2020-09-15 DIAGNOSIS — C3431 Malignant neoplasm of lower lobe, right bronchus or lung: Secondary | ICD-10-CM | POA: Diagnosis not present

## 2020-09-15 DIAGNOSIS — K760 Fatty (change of) liver, not elsewhere classified: Secondary | ICD-10-CM | POA: Diagnosis not present

## 2020-09-15 NOTE — Progress Notes (Signed)
[name], [place] [address]   CLINIC:  Medical Oncology/Hematology  PCP:  [name], MD [address] / [place] [address] [phone]   REASON FOR VISIT:  Follow-up for metastatic lung cancer  PRIOR THERAPY: None  NGS Results: Not done  CURRENT THERAPY: Dabrafenib 100 mg BID and trametinib 1 mg daily  BRIEF ONCOLOGIC HISTORY:  Oncology History   No history exists.    CANCER STAGING: Cancer Staging No matching staging information was found for the patient.  INTERVAL HISTORY:  [name], a 80 y.o. male, returns for routine follow-up of his metastatic lung cancer. [name] was last seen on 06/12/2020.   Today he is accompanied by his wife and he reports feeling okay. His breathing is okay and he denies having new cough, SOB or orthopnea. He is tolerating dabrafenib and trametinib well. He spent a week in a nursing home for his hip fracture and then came home; he saw Dr. Larena Glassman last on 04/27.   REVIEW OF SYSTEMS:  Review of Systems  Constitutional: Positive for fatigue (50%). Negative for appetite change.  Respiratory: Negative for cough and shortness of breath.   All other systems reviewed and are negative.   PAST MEDICAL/SURGICAL HISTORY:  Past Medical History:  Diagnosis Date  . Cancer ([place])   . Chronic kidney disease   . Coronary artery disease   . Diabetes mellitus   . Hypertension   . 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 Spontaneous pneumothorax 09/15/2010   right   Past Surgical History:  Procedure Laterality Date  . ANKLE FRACTURE SURGERY  2008   [facility]  . APPENDECTOMY  1960's  . CHEST TUBE INSERTION Right 09/15/2010   Dr Servando Snare - spontaneous PTX  . CHOLECYSTECTOMY  2008  . COLONOSCOPY N/A 09/07/2017   Procedure: COLONOSCOPY;  Surgeon: Daneil Dolin, MD;  Location: [facility]  ENDO SUITE;  Service: Endoscopy;  Laterality: N/A;  10:30am  . CORONARY ARTERY BYPASS GRAFT  2003  . EYE SURGERY  2001   Cataracts removed bilaterally  . INCISIONAL HERNIA REPAIR N/A 01/29/2015   Procedure: Fatima Blank HERNIORRHAPHY WITH MESH;  Surgeon: Aviva Signs, MD;  Location: [facility] ORS;  Service: General;  Laterality: N/A;  . INSERTION OF MESH N/A 01/29/2015   Procedure: INSERTION OF MESH;  Surgeon: Aviva Signs, MD;  Location: [facility] ORS;  Service: General;  Laterality: N/A;  . [place]  . LEFT HEART CATH AND CORS/GRAFTS ANGIOGRAPHY N/A 06/20/2017   Procedure: LEFT HEART CATH AND CORS/GRAFTS ANGIOGRAPHY;  Surgeon: Martinique, Peter M, MD;  Location: [place] CV LAB;  Service: Cardiovascular;  Laterality: N/A;  . ORIF ANKLE FRACTURE  08/26/2011   Procedure: OPEN REDUCTION INTERNAL FIXATION (ORIF) ANKLE FRACTURE;  Surgeon: Sanjuana Kava, MD;  Location: [facility] ORS;  Service: Orthopedics;  Laterality: Right;  . POLYPECTOMY  09/07/2017   Procedure: POLYPECTOMY;  Surgeon: Daneil Dolin, MD;  Location: [facility] ENDO SUITE;  Service: Endoscopy;;  ascending x2 (cold snare)  . PROSTATE SURGERY  2012   Enlarged prostate  . TRANSURETHRAL RESECTION OF PROSTATE  2012    SOCIAL HISTORY:  Social History   Socioeconomic History  . Marital status: Married    Spouse name: Not on file  . Number of children: Not on file  .  Years of education: Not on file  . Highest education level: Not on file  Occupational History  . Not on file  Tobacco Use  . Smoking status: Former Smoker    Packs/day: 1.00    Years: 33.00    Pack years: 33.00    Quit date: 05/17/1987    Years since quitting: 33.3  . Smokeless tobacco: Never Used  Vaping Use  . Vaping Use: Never used  Substance and Sexual Activity  . Alcohol use: No  . Drug use: No  . Sexual activity: Yes  Other Topics Concern  . Not on file  Social History Narrative  . Not on file   Social Determinants of Health   Financial  Resource Strain: Low Risk   . Difficulty of Paying Living Expenses: Not very hard  Food Insecurity: No Food Insecurity  . Worried About Charity fundraiser in the Last Year: Never true  . Ran Out of Food in the Last Year: Never true  Transportation Needs: No Transportation Needs  . Lack of Transportation (Medical): No  . Lack of Transportation (Non-Medical): No  Physical Activity: Inactive  . Days of Exercise per Week: 0 days  . Minutes of Exercise per Session: 0 min  Stress: No Stress Concern Present  . Feeling of Stress : Not at all  Social Connections: Socially Integrated  . Frequency of Communication with Friends and Family: More than three times a week  . Frequency of Social Gatherings with Friends and Family: Three times a week  . Attends Religious Services: 1 to 4 times per year  . Active Member of Clubs or Organizations: No  . Attends Archivist Meetings: 1 to 4 times per year  . Marital Status: Married  Human resources officer Violence: Not At Risk  . Fear of Current or Ex-Partner: No  . Emotionally Abused: No  . Physically Abused: No  . Sexually Abused: No    FAMILY HISTORY:  Family History  Problem Relation Age of Onset  . Aneurysm Mother   . Heart attack Father   . Colon cancer Maternal Uncle   . Cancer Brother   . Gastric cancer Neg Hx   . Esophageal cancer Neg Hx     CURRENT MEDICATIONS:  Current Outpatient Medications  Medication Sig Dispense Refill  . amLODipine (NORVASC) 10 MG tablet Take 1 tablet (10 mg total) by mouth daily. 90 tablet 0  . aspirin EC 81 MG tablet Take 1 tablet (81 mg total) by mouth in the morning and at bedtime. 30 tablet 11  . Cholecalciferol (VITAMIN D3) 5000 units CAPS Take 5,000 Units by mouth daily.    Marland Kitchen dabrafenib mesylate (TAFINLAR) 50 MG capsule TAKE 2 CAPSULES (100 MG TOTAL) BY MOUTH 2 (TWO) TIMES DAILY. TAKE ON AN EMPTY STOMACH 1 HOUR BEFORE OR 2 HOURS AFTER MEALS. 120 capsule 3  . escitalopram (LEXAPRO) 10 MG tablet Take  10 mg by mouth daily.     Marland Kitchen glimepiride (AMARYL) 2 MG tablet Take 2 mg by mouth daily with breakfast.     . insulin lispro (HUMALOG) 100 UNIT/ML injection Inject 2-10 Units into the skin 3 (three) times daily before meals. Per sliding scale    . insulin lispro (HUMALOG) 100 UNIT/ML KwikPen Inject 2-10 Units into the skin See admin instructions. Inject as per sliding scale: if 200-250=2 units, 251-300=4 units, 301-350=6 units, 351-400=8 units, over 400 give 10 units and notify MD, subcuatneoulsy before meals and at bedtime for dm    .  isosorbide mononitrate (IMDUR) 30 MG 24 hr tablet TAKE ONE TABLET (30MG TOTAL) BY MOUTH DAILY (Patient taking differently: Take 30 mg by mouth daily.) 90 tablet 2  . metFORMIN (GLUCOPHAGE) 500 MG tablet Take 1 tablet by mouth 2 (two) times daily.    . methocarbamol (ROBAXIN) 500 MG tablet Take 1 tablet (500 mg total) by mouth every 8 (eight) hours as needed for muscle spasms.    . metoprolol (LOPRESSOR) 100 MG tablet Take 100 mg by mouth 2 (two) times daily.    . nitroGLYCERIN (NITROSTAT) 0.4 MG SL tablet Place 0.4 mg under the tongue every 5 (five) minutes as needed for chest pain.     Marland Kitchen ondansetron (ZOFRAN ODT) 4 MG disintegrating tablet 69m ODT q4 hours prn nausea/vomit 12 tablet 0  . ondansetron (ZOFRAN) 8 MG tablet Take 1 tablet (8 mg total) by mouth every 8 (eight) hours as needed for nausea or vomiting. 30 tablet 2  . ONE TOUCH ULTRA TEST test strip     . pioglitazone (ACTOS) 45 MG tablet Take 45 mg by mouth daily.    . polyethylene glycol (MIRALAX / GLYCOLAX) 17 g packet Take 17 g by mouth daily as needed for mild constipation. 14 each 0  . prochlorperazine (COMPAZINE) 10 MG tablet Take 1 tablet (10 mg total) by mouth every 6 (six) hours as needed for nausea or vomiting. 30 tablet 2  . tamsulosin (FLOMAX) 0.4 MG CAPS capsule Take 1 capsule (0.4 mg total) by mouth daily after supper. 30 capsule   . trametinib dimethyl sulfoxide (MEKINIST) 0.5 MG tablet TAKE 2  TABLETS (1 MG TOTAL) BY MOUTH DAILY. TAKE 1 HOUR BEFORE OR 2 HOURS AFTER A MEAL. STORE REFRIGERATED IN ORIGINAL CONTAINER. 60 tablet 3   No current facility-administered medications for this visit.    ALLERGIES:  No Known Allergies  PHYSICAL EXAM:  Performance status (ECOG): 1 - Symptomatic but completely ambulatory  Vitals:   09/15/20 1448  BP: 135/75  Pulse: 65  Resp: 20  Temp: (!) 97 F (36.1 C)  SpO2: 94%   Wt Readings from Last 3 Encounters:  09/15/20 182 lb 4.8 oz (82.7 kg)  09/10/20 178 lb (80.7 kg)  07/30/20 183 lb (83 kg)   Physical Exam Vitals reviewed.  Constitutional:      Appearance: Normal appearance.  Cardiovascular:     Rate and Rhythm: Normal rate and regular rhythm.     Pulses: Normal pulses.     Heart sounds: Normal heart sounds.  Pulmonary:     Effort: Pulmonary effort is normal.     Breath sounds: Normal breath sounds.  Musculoskeletal:     Right lower leg: Edema (trace) present.     Left lower leg: Edema (trace) present.  Neurological:     General: No focal deficit present.     Mental Status: He is alert and oriented to person, place, and time.  Psychiatric:        Mood and Affect: Mood normal.        Behavior: Behavior normal.      LABORATORY DATA:  I have reviewed the labs as listed.  CBC Latest Ref Rng & Units 09/08/2020 07/23/2020 07/15/2020  WBC 4.0 - 10.5 K/uL 7.8 9.1 4.9  Hemoglobin 13.0 - 17.0 g/dL 11.7(L) 13.4 11.2(L)  Hematocrit 39.0 - 52.0 % 34.9(L) 39.1 33.2(L)  Platelets 150 - 400 K/uL 260 247 107(L)   CMP Latest Ref Rng & Units 09/08/2020 07/23/2020 07/15/2020  Glucose 70 - 99 mg/dL 287(H)  231(H) 156(H)  BUN 8 - 23 mg/dL _0 Creatinine 0.61 - 1.24 mg/dL 1.39(H) 1.21 1.43(H)  Sodium 135 - 145 mmol/L 130(L) 131(L) 135  Potassium 3.5 - 5.1 mmol/L 4.1 3.4(L) 3.8  Chloride 98 - 111 mmol/L 96(L) 96(L) 99  CO2 22 - 32 mmol/L _1 Calcium 8.9 - 10.3 mg/dL 8.9 8.8(L) 8.5(L)  Total Protein 6.5 - 8.1 g/dL 6.7 7.0 -  Total  Bilirubin 0.3 - 1.2 mg/dL 0.7 1.3(H) -  Alkaline Phos 38 - 126 U/L 119 93 -  AST 15 - 41 U/L 21 18 -  ALT 0 - 44 U/L 16 15 -    DIAGNOSTIC IMAGING:  I have independently reviewed the scans and discussed with the patient. CT Chest W Contrast  Result Date: 09/09/2020 CLINICAL DATA:  Non-small cell lung cancer, follow-up evaluation. Diagnosed in 2021. Ongoing systemic therapy by report. EXAM: CT CHEST WITH CONTRAST TECHNIQUE: Multidetector CT imaging of the chest was performed during intravenous contrast administration. CONTRAST:  68m OMNIPAQUE IOHEXOL 300 MG/ML  SOLN COMPARISON:  March 11, 2020 FINDINGS: Cardiovascular: Calcified and noncalcified atheromatous plaque of the thoracic aorta. Post median sternotomy for CABG. Heart size mildly enlarged without pericardial effusion. Central pulmonary vasculature normal caliber. Mediastinum/Nodes: Thoracic inlet structures are normal. No axillary, retropectoral, mediastinal or hilar lymphadenopathy. Lungs/Pleura: Numerous bilateral pulmonary nodules. LEFT suprahilar upper lobe nodule (image 51, series 4) 9 mm previously 7 mm 106/4 LEFT juxta diaphragmatic pulmonary nodule in the LEFT lower lobe, 8 mm previously 8 mm 72/4 6 mm RIGHT upper lobe pulmonary nodule unchanged. Essentially largely stable appearance of numerous pulmonary nodules. Multiplicity would make it difficult to assess for new nodules particularly given small size of many of these nodules. Pulmonary emphysema. No consolidation. No pleural effusion. Airways are patent. Upper Abdomen: Incidental imaging of upper abdominal contents with hepatic steatosis suspected. Post cholecystectomy. Pancreas, spleen, adrenal glands and kidneys are unremarkable with respect imaged portions. No acute upper abdominal process. Musculoskeletal: No acute musculoskeletal finding. Spinal degenerative changes. Post sternotomy. IMPRESSION: 1. Numerous bilateral pulmonary nodules, largely stable. Multiplicity would make it  difficult to assess for new nodules particularly given small size of many of these nodules. 2. No new or progressive findings. 3. Hepatic steatosis. 4. Emphysema and aortic atherosclerosis. Aortic Atherosclerosis (ICD10-I70.0). Electronically Signed   By: GZetta BillsM.D.   On: 09/09/2020 07:28   DG Knee 4 Views W/Patella Left  Result Date: 09/13/2020  XR of the left knee demonstrates mild varus alignment.  Loss of medial joint space with some remaining space.  No significant osteophytes.   Impression: Mild left knee arthritis  DG HIP UNILAT WITH PELVIS 2-3 VIEWS LEFT  Result Date: 09/13/2020  X-rays of the pelvis, and the left hip demonstrates a minimally displaced greater trochanter fracture.  Interval consolidation.  No acute injury.  Degenerative changes within the hip joint.  Impression: Healing left greater trochanter fracture .    ASSESSMENT:  1. 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 -Last CT of the chest on 09/26/2019 showed stable lung nodules. -CT chest with contrast on 03/11/2020 shows innumerable scattered bilateral pulmonary nodules under 1 cm in diameter, not appreciably changed from prior scan.  2. High risk drug monitoring: -Echocardiogram on 07/16/2019 shows EF 60-65% with grade 1 diastolic dysfunction. -Echo on 03/05/2020 with EF 60-65%.   PLAN:  1. 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 - Reviewed CT chest with contrast from 09/08/2020 which showed numerous bilateral pulmonary nodules, largely stable with no new or progressive findings.  Hepatic steatosis present. - He reportedly had sustained left hip fracture which was nondisplaced end of February.  He stayed briefly for 5 days in the nursing home and is back at home.   He is undergoing physical therapy at home. - We reviewed his labs which showed normal white count and platelet count.  LFTs are grossly normal.  Albumin is slightly low at 3.1.  He has mild edema in the left leg. - Continue same dose of dabrafenib and trametinib.  We will plan to see him back in 3 months for follow-up with repeat labs.  Plan to repeat scans in 4 to 6 months.  2. High risk drug monitoring: -Last echo on 03/05/2020 with EF 60 to 65%. - We will plan to repeat echo prior to next visit.  3. Hypertension: -Blood pressure is 135/75.  Continue metoprolol and amlodipine.  4. Diabetes: -Continue metformin, Actos and Januvia.  5.  Normocytic anemia: -His hemoglobin dropped slightly to 11.7.  Likely from recent hip fracture.   Orders placed this encounter:  No orders of the defined types were placed in this encounter.    Derek Jack, MD [place] [phone]   I, Milinda Antis, am acting as a scribe for Dr. Sanda Linger.  I, Derek Jack MD, have reviewed the above documentation for accuracy and completeness, and I agree with the above.

## 2020-09-15 NOTE — Patient Instructions (Signed)
[place] at [facility] Discharge Instructions  You were seen today by Dr. Delton Coombes. He went over your recent results and scans. You will be getting scans done every 6 months. Eat protein-dense meals and drink protein shakes to improve your blood albumin level to improve your leg swelling. You will be scheduled to have an echocardiogram done before your next visit. Dr. Delton Coombes will see you back in 3 months for labs and follow up.   Thank you for choosing [place] at [facility] to provide your oncology and hematology care.  To afford each patient quality time with our provider, please arrive at least 15 minutes before your scheduled appointment time.   If you have a lab appointment with the [facility] please come in thru the Main Entrance and check in at the main information desk  You need to re-schedule your appointment should you arrive 10 or more minutes late.  We strive to give you quality time with our providers, and arriving late affects you and other patients whose appointments are after yours.  Also, if you no show three or more times for appointments you may be dismissed from the clinic at the providers discretion.     Again, thank you for choosing [facility].  Our hope is that these requests will decrease the amount of time that you wait before being seen by our physicians.       _____________________________________________________________  Should you have questions after your visit to [facility], please contact our office at [phone] between the hours of 8:00 a.m. and 4:30 p.m.  Voicemails left after 4:00 p.m. will not be returned until the following business day.  For prescription refill requests, have your pharmacy contact our office and allow 72 hours.    Cancer Center Support Programs:   > Cancer Support Group  2nd Tuesday of the month 1pm-2pm, Journey Room

## 2020-09-16 DIAGNOSIS — D649 Anemia, unspecified: Secondary | ICD-10-CM | POA: Diagnosis not present

## 2020-09-16 DIAGNOSIS — Z794 Long term (current) use of insulin: Secondary | ICD-10-CM | POA: Diagnosis not present

## 2020-09-16 DIAGNOSIS — N1832 Chronic kidney disease, stage 3b: Secondary | ICD-10-CM | POA: Diagnosis not present

## 2020-09-16 DIAGNOSIS — I129 Hypertensive chronic kidney disease with stage 1 through stage 4 chronic kidney disease, or unspecified chronic kidney disease: Secondary | ICD-10-CM | POA: Diagnosis not present

## 2020-09-16 DIAGNOSIS — C3490 Malignant neoplasm of unspecified part of unspecified bronchus or lung: Secondary | ICD-10-CM | POA: Diagnosis not present

## 2020-09-16 DIAGNOSIS — Z79899 Other long term (current) drug therapy: Secondary | ICD-10-CM | POA: Diagnosis not present

## 2020-09-16 DIAGNOSIS — E119 Type 2 diabetes mellitus without complications: Secondary | ICD-10-CM | POA: Diagnosis not present

## 2020-09-16 DIAGNOSIS — Z951 Presence of aortocoronary bypass graft: Secondary | ICD-10-CM | POA: Diagnosis not present

## 2020-09-16 DIAGNOSIS — Z7984 Long term (current) use of oral hypoglycemic drugs: Secondary | ICD-10-CM | POA: Diagnosis not present

## 2020-09-16 DIAGNOSIS — S72145A Nondisplaced intertrochanteric fracture of left femur, initial encounter for closed fracture: Secondary | ICD-10-CM | POA: Diagnosis not present

## 2020-09-22 DIAGNOSIS — N1832 Chronic kidney disease, stage 3b: Secondary | ICD-10-CM | POA: Diagnosis not present

## 2020-09-22 DIAGNOSIS — Z7984 Long term (current) use of oral hypoglycemic drugs: Secondary | ICD-10-CM | POA: Diagnosis not present

## 2020-09-22 DIAGNOSIS — Z79899 Other long term (current) drug therapy: Secondary | ICD-10-CM | POA: Diagnosis not present

## 2020-09-22 DIAGNOSIS — C3490 Malignant neoplasm of unspecified part of unspecified bronchus or lung: Secondary | ICD-10-CM | POA: Diagnosis not present

## 2020-09-22 DIAGNOSIS — Z794 Long term (current) use of insulin: Secondary | ICD-10-CM | POA: Diagnosis not present

## 2020-09-22 DIAGNOSIS — I129 Hypertensive chronic kidney disease with stage 1 through stage 4 chronic kidney disease, or unspecified chronic kidney disease: Secondary | ICD-10-CM | POA: Diagnosis not present

## 2020-09-22 DIAGNOSIS — Z951 Presence of aortocoronary bypass graft: Secondary | ICD-10-CM | POA: Diagnosis not present

## 2020-09-22 DIAGNOSIS — S72145A Nondisplaced intertrochanteric fracture of left femur, initial encounter for closed fracture: Secondary | ICD-10-CM | POA: Diagnosis not present

## 2020-09-22 DIAGNOSIS — E119 Type 2 diabetes mellitus without complications: Secondary | ICD-10-CM | POA: Diagnosis not present

## 2020-09-22 DIAGNOSIS — D649 Anemia, unspecified: Secondary | ICD-10-CM | POA: Diagnosis not present

## 2020-09-26 DIAGNOSIS — I2510 Atherosclerotic heart disease of native coronary artery without angina pectoris: Secondary | ICD-10-CM | POA: Diagnosis not present

## 2020-09-26 DIAGNOSIS — E1129 Type 2 diabetes mellitus with other diabetic kidney complication: Secondary | ICD-10-CM | POA: Diagnosis not present

## 2020-09-26 DIAGNOSIS — N183 Chronic kidney disease, stage 3 unspecified: Secondary | ICD-10-CM | POA: Diagnosis not present

## 2020-09-26 DIAGNOSIS — Z79899 Other long term (current) drug therapy: Secondary | ICD-10-CM | POA: Diagnosis not present

## 2020-09-26 DIAGNOSIS — I700 Atherosclerosis of aorta: Secondary | ICD-10-CM | POA: Diagnosis not present

## 2020-10-01 ENCOUNTER — Other Ambulatory Visit (HOSPITAL_COMMUNITY): Payer: Self-pay

## 2020-10-01 DIAGNOSIS — E119 Type 2 diabetes mellitus without complications: Secondary | ICD-10-CM | POA: Diagnosis not present

## 2020-10-01 DIAGNOSIS — I129 Hypertensive chronic kidney disease with stage 1 through stage 4 chronic kidney disease, or unspecified chronic kidney disease: Secondary | ICD-10-CM | POA: Diagnosis not present

## 2020-10-01 DIAGNOSIS — S72145A Nondisplaced intertrochanteric fracture of left femur, initial encounter for closed fracture: Secondary | ICD-10-CM | POA: Diagnosis not present

## 2020-10-01 DIAGNOSIS — Z79899 Other long term (current) drug therapy: Secondary | ICD-10-CM | POA: Diagnosis not present

## 2020-10-01 DIAGNOSIS — C3490 Malignant neoplasm of unspecified part of unspecified bronchus or lung: Secondary | ICD-10-CM | POA: Diagnosis not present

## 2020-10-01 DIAGNOSIS — Z794 Long term (current) use of insulin: Secondary | ICD-10-CM | POA: Diagnosis not present

## 2020-10-01 DIAGNOSIS — D649 Anemia, unspecified: Secondary | ICD-10-CM | POA: Diagnosis not present

## 2020-10-01 DIAGNOSIS — N1832 Chronic kidney disease, stage 3b: Secondary | ICD-10-CM | POA: Diagnosis not present

## 2020-10-01 DIAGNOSIS — Z951 Presence of aortocoronary bypass graft: Secondary | ICD-10-CM | POA: Diagnosis not present

## 2020-10-01 DIAGNOSIS — Z7984 Long term (current) use of oral hypoglycemic drugs: Secondary | ICD-10-CM | POA: Diagnosis not present

## 2020-10-01 MED ORDER — TRAMETINIB DIMETHYL SULFOXIDE 0.5 MG PO TABS: ORAL_TABLET | ORAL | 3 refills | Status: DC

## 2020-10-01 MED ORDER — DABRAFENIB MESYLATE 50 MG PO CAPS: ORAL_CAPSULE | ORAL | 3 refills | Status: DC

## 2020-10-02 ENCOUNTER — Other Ambulatory Visit (HOSPITAL_COMMUNITY): Payer: Self-pay | Admitting: Internal Medicine

## 2020-10-02 DIAGNOSIS — E1122 Type 2 diabetes mellitus with diabetic chronic kidney disease: Secondary | ICD-10-CM | POA: Diagnosis not present

## 2020-10-02 DIAGNOSIS — S7292XA Unspecified fracture of left femur, initial encounter for closed fracture: Secondary | ICD-10-CM

## 2020-10-02 DIAGNOSIS — C3490 Malignant neoplasm of unspecified part of unspecified bronchus or lung: Secondary | ICD-10-CM | POA: Diagnosis not present

## 2020-10-02 DIAGNOSIS — N1831 Chronic kidney disease, stage 3a: Secondary | ICD-10-CM | POA: Diagnosis not present

## 2020-10-07 ENCOUNTER — Ambulatory Visit (HOSPITAL_COMMUNITY)
Admission: RE | Admit: 2020-10-07 | Discharge: 2020-10-07 | Disposition: A | Discharge status: Home / Self Care | Payer: Medicare Other | Source: Ambulatory Visit | Attending: Internal Medicine | Admitting: Internal Medicine

## 2020-10-07 DIAGNOSIS — Z8781 Personal history of (healed) traumatic fracture: Secondary | ICD-10-CM | POA: Diagnosis not present

## 2020-10-07 DIAGNOSIS — M810 Age-related osteoporosis without current pathological fracture: Secondary | ICD-10-CM | POA: Diagnosis not present

## 2020-10-07 DIAGNOSIS — Z13820 Encounter for screening for osteoporosis: Secondary | ICD-10-CM | POA: Insufficient documentation

## 2020-10-07 DIAGNOSIS — M80052A Age-related osteoporosis with current pathological fracture, left femur, initial encounter for fracture: Secondary | ICD-10-CM | POA: Diagnosis not present

## 2020-10-07 DIAGNOSIS — S7292XA Unspecified fracture of left femur, initial encounter for closed fracture: Secondary | ICD-10-CM

## 2020-10-08 DIAGNOSIS — S72145A Nondisplaced intertrochanteric fracture of left femur, initial encounter for closed fracture: Secondary | ICD-10-CM | POA: Diagnosis not present

## 2020-10-08 DIAGNOSIS — D649 Anemia, unspecified: Secondary | ICD-10-CM | POA: Diagnosis not present

## 2020-10-08 DIAGNOSIS — Z794 Long term (current) use of insulin: Secondary | ICD-10-CM | POA: Diagnosis not present

## 2020-10-08 DIAGNOSIS — Z951 Presence of aortocoronary bypass graft: Secondary | ICD-10-CM | POA: Diagnosis not present

## 2020-10-08 DIAGNOSIS — N1832 Chronic kidney disease, stage 3b: Secondary | ICD-10-CM | POA: Diagnosis not present

## 2020-10-08 DIAGNOSIS — Z7984 Long term (current) use of oral hypoglycemic drugs: Secondary | ICD-10-CM | POA: Diagnosis not present

## 2020-10-08 DIAGNOSIS — E119 Type 2 diabetes mellitus without complications: Secondary | ICD-10-CM | POA: Diagnosis not present

## 2020-10-08 DIAGNOSIS — I129 Hypertensive chronic kidney disease with stage 1 through stage 4 chronic kidney disease, or unspecified chronic kidney disease: Secondary | ICD-10-CM | POA: Diagnosis not present

## 2020-10-08 DIAGNOSIS — C3490 Malignant neoplasm of unspecified part of unspecified bronchus or lung: Secondary | ICD-10-CM | POA: Diagnosis not present

## 2020-10-08 DIAGNOSIS — Z79899 Other long term (current) drug therapy: Secondary | ICD-10-CM | POA: Diagnosis not present

## 2020-10-27 ENCOUNTER — Other Ambulatory Visit: Payer: Self-pay | Admitting: Student

## 2020-10-27 DIAGNOSIS — I10 Essential (primary) hypertension: Secondary | ICD-10-CM

## 2020-12-23 ENCOUNTER — Ambulatory Visit (HOSPITAL_COMMUNITY): Payer: Medicare Other | Admitting: Hematology

## 2020-12-26 DIAGNOSIS — C3490 Malignant neoplasm of unspecified part of unspecified bronchus or lung: Secondary | ICD-10-CM | POA: Diagnosis not present

## 2020-12-26 DIAGNOSIS — N1831 Chronic kidney disease, stage 3a: Secondary | ICD-10-CM | POA: Diagnosis not present

## 2020-12-26 DIAGNOSIS — Z79899 Other long term (current) drug therapy: Secondary | ICD-10-CM | POA: Diagnosis not present

## 2020-12-26 DIAGNOSIS — E1129 Type 2 diabetes mellitus with other diabetic kidney complication: Secondary | ICD-10-CM | POA: Diagnosis not present

## 2020-12-26 DIAGNOSIS — I2510 Atherosclerotic heart disease of native coronary artery without angina pectoris: Secondary | ICD-10-CM | POA: Diagnosis not present

## 2021-01-02 DIAGNOSIS — E785 Hyperlipidemia, unspecified: Secondary | ICD-10-CM | POA: Diagnosis not present

## 2021-01-02 DIAGNOSIS — C3490 Malignant neoplasm of unspecified part of unspecified bronchus or lung: Secondary | ICD-10-CM | POA: Diagnosis not present

## 2021-01-02 DIAGNOSIS — I25119 Atherosclerotic heart disease of native coronary artery with unspecified angina pectoris: Secondary | ICD-10-CM | POA: Diagnosis not present

## 2021-01-02 DIAGNOSIS — I700 Atherosclerosis of aorta: Secondary | ICD-10-CM | POA: Diagnosis not present

## 2021-01-02 DIAGNOSIS — M810 Age-related osteoporosis without current pathological fracture: Secondary | ICD-10-CM | POA: Diagnosis not present

## 2021-01-02 DIAGNOSIS — R7309 Other abnormal glucose: Secondary | ICD-10-CM | POA: Diagnosis not present

## 2021-01-02 DIAGNOSIS — E1122 Type 2 diabetes mellitus with diabetic chronic kidney disease: Secondary | ICD-10-CM | POA: Diagnosis not present

## 2021-01-02 DIAGNOSIS — N1832 Chronic kidney disease, stage 3b: Secondary | ICD-10-CM | POA: Diagnosis not present

## 2021-01-09 ENCOUNTER — Inpatient Hospital Stay (HOSPITAL_COMMUNITY): Payer: Medicare Other | Attending: Hematology

## 2021-01-09 ENCOUNTER — Other Ambulatory Visit (HOSPITAL_COMMUNITY): Payer: Self-pay

## 2021-01-09 ENCOUNTER — Ambulatory Visit (HOSPITAL_COMMUNITY)
Admission: RE | Admit: 2021-01-09 | Discharge: 2021-01-09 | Disposition: A | Discharge status: Home / Self Care | Payer: Medicare Other | Source: Ambulatory Visit | Attending: Hematology | Admitting: Hematology

## 2021-01-09 ENCOUNTER — Other Ambulatory Visit: Payer: Self-pay

## 2021-01-09 DIAGNOSIS — E119 Type 2 diabetes mellitus without complications: Secondary | ICD-10-CM | POA: Insufficient documentation

## 2021-01-09 DIAGNOSIS — I2510 Atherosclerotic heart disease of native coronary artery without angina pectoris: Secondary | ICD-10-CM | POA: Diagnosis not present

## 2021-01-09 DIAGNOSIS — Z79899 Other long term (current) drug therapy: Secondary | ICD-10-CM | POA: Insufficient documentation

## 2021-01-09 DIAGNOSIS — Z01818 Encounter for other preprocedural examination: Secondary | ICD-10-CM | POA: Insufficient documentation

## 2021-01-09 DIAGNOSIS — Z87891 Personal history of nicotine dependence: Secondary | ICD-10-CM | POA: Insufficient documentation

## 2021-01-09 DIAGNOSIS — C3490 Malignant neoplasm of unspecified part of unspecified bronchus or lung: Secondary | ICD-10-CM

## 2021-01-09 DIAGNOSIS — Z951 Presence of aortocoronary bypass graft: Secondary | ICD-10-CM | POA: Insufficient documentation

## 2021-01-09 DIAGNOSIS — I10 Essential (primary) hypertension: Secondary | ICD-10-CM | POA: Insufficient documentation

## 2021-01-09 DIAGNOSIS — Z0189 Encounter for other specified special examinations: Secondary | ICD-10-CM | POA: Diagnosis not present

## 2021-01-09 LAB — CBC WITH DIFFERENTIAL/PLATELET
Abs Immature Granulocytes: 0.03 10*3/uL (ref 0.00–0.07)
Basophils Absolute: 0 10*3/uL (ref 0.0–0.1)
Basophils Relative: 0 %
Eosinophils Absolute: 0 10*3/uL (ref 0.0–0.5)
Eosinophils Relative: 0 %
HCT: 32.8 % — ABNORMAL LOW (ref 39.0–52.0)
Hemoglobin: 10.7 g/dL — ABNORMAL LOW (ref 13.0–17.0)
Immature Granulocytes: 0 %
Lymphocytes Relative: 21 %
Lymphs Abs: 1.5 10*3/uL (ref 0.7–4.0)
MCH: 27 pg (ref 26.0–34.0)
MCHC: 32.6 g/dL (ref 30.0–36.0)
MCV: 82.6 fL (ref 80.0–100.0)
Monocytes Absolute: 0.5 10*3/uL (ref 0.1–1.0)
Monocytes Relative: 7 %
Neutro Abs: 4.8 10*3/uL (ref 1.7–7.7)
Neutrophils Relative %: 72 %
Platelets: 211 10*3/uL (ref 150–400)
RBC: 3.97 MIL/uL — ABNORMAL LOW (ref 4.22–5.81)
RDW: 15.5 % (ref 11.5–15.5)
WBC: 6.8 10*3/uL (ref 4.0–10.5)
nRBC: 0 % (ref 0.0–0.2)

## 2021-01-09 LAB — COMPREHENSIVE METABOLIC PANEL
ALT: 12 U/L (ref 0–44)
AST: 18 U/L (ref 15–41)
Albumin: 2.9 g/dL — ABNORMAL LOW (ref 3.5–5.0)
Alkaline Phosphatase: 82 U/L (ref 38–126)
Anion gap: 4 — ABNORMAL LOW (ref 5–15)
BUN: 13 mg/dL (ref 8–23)
CO2: 25 mmol/L (ref 22–32)
Calcium: 8.5 mg/dL — ABNORMAL LOW (ref 8.9–10.3)
Chloride: 100 mmol/L (ref 98–111)
Creatinine, Ser: 1.44 mg/dL — ABNORMAL HIGH (ref 0.61–1.24)
GFR, Estimated: 49 mL/min — ABNORMAL LOW (ref >60)
Glucose, Bld: 225 mg/dL — ABNORMAL HIGH (ref 70–99)
Potassium: 4.3 mmol/L (ref 3.5–5.1)
Sodium: 129 mmol/L — ABNORMAL LOW (ref 135–145)
Total Bilirubin: 0.6 mg/dL (ref 0.3–1.2)
Total Protein: 7 g/dL (ref 6.5–8.1)

## 2021-01-09 LAB — ECHOCARDIOGRAM COMPLETE
AR max vel: 3.44 cm2
AV Area VTI: 3.16 cm2
AV Area mean vel: 3.2 cm2
AV Mean grad: 3.1 mmHg
AV Peak grad: 6.2 mmHg
Ao pk vel: 1.24 m/s
Area-P 1/2: 2.1 cm2
S' Lateral: 3.5 cm

## 2021-01-09 LAB — TSH: TSH: 1.32 u[IU]/mL (ref 0.350–4.500)

## 2021-01-09 NOTE — Progress Notes (Signed)
  Echocardiogram 2D Echocardiogram has been performed.  [name] 01/09/2021, 10:05 AM

## 2021-01-13 ENCOUNTER — Other Ambulatory Visit (HOSPITAL_COMMUNITY): Payer: Self-pay | Admitting: *Deleted

## 2021-01-13 DIAGNOSIS — C3490 Malignant neoplasm of unspecified part of unspecified bronchus or lung: Secondary | ICD-10-CM

## 2021-01-13 MED ORDER — DABRAFENIB MESYLATE 50 MG PO CAPS: ORAL_CAPSULE | ORAL | 3 refills | Status: DC

## 2021-01-13 MED ORDER — TRAMETINIB DIMETHYL SULFOXIDE 0.5 MG PO TABS: ORAL_TABLET | ORAL | 3 refills | Status: DC

## 2021-01-14 ENCOUNTER — Other Ambulatory Visit (HOSPITAL_COMMUNITY): Payer: Self-pay

## 2021-01-14 DIAGNOSIS — C3490 Malignant neoplasm of unspecified part of unspecified bronchus or lung: Secondary | ICD-10-CM

## 2021-01-14 MED ORDER — DABRAFENIB MESYLATE 50 MG PO CAPS: ORAL_CAPSULE | ORAL | 3 refills | Status: DC

## 2021-01-14 MED ORDER — TRAMETINIB DIMETHYL SULFOXIDE 0.5 MG PO TABS: ORAL_TABLET | ORAL | 3 refills | Status: DC

## 2021-01-14 NOTE — Telephone Encounter (Signed)
Chart reviewed. Dabrafenib and trametinib refilled per last office visit note with Dr. Delton Coombes

## 2021-01-14 NOTE — Progress Notes (Signed)
[name], [name] [id number]   CLINIC:  Medical Oncology/Hematology  PCP:  [name], MD [address] / [place] [address] [phone]   REASON FOR VISIT:  Follow-up for metastatic lung cancer  PRIOR THERAPY: none  NGS Results:  not done  CURRENT THERAPY: Dabrafenib 100 mg BID and trametinib 1 mg daily   INTERVAL HISTORY:  [name], a 80 y.o. male, returns for routine follow-up of his metastatic lung cancer. [name] was last seen on 09/15/2020.   Today he reports feeling well. He reports a fall this week. He reports intermittent loss of appetite. He denies n/v/d, orthopnea, ankle swellings, and bleeding issues.   REVIEW OF SYSTEMS:  Review of Systems  Constitutional:  Positive for appetite change (40%). Negative for fatigue (50%).  HENT:   Negative for nosebleeds.   Cardiovascular:  Negative for leg swelling.  Gastrointestinal:  Negative for blood in stool, diarrhea, nausea and vomiting.  Genitourinary:  Negative for hematuria.   Hematological:  Does not bruise/bleed easily.  All other systems reviewed and are negative.  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   LIMA to LAD, SVG to D1, SVG to OM2, SVG to RCA, open vein harvest right thigh and lower leg   Spontaneous pneumothorax 09/15/2010   right   Past Surgical History:  Procedure Laterality Date   ANKLE FRACTURE SURGERY  2008   [facility]   APPENDECTOMY  [id number]   CHEST TUBE INSERTION Right 09/15/2010   Dr Servando Snare - spontaneous PTX   CHOLECYSTECTOMY  2008   COLONOSCOPY N/A 09/07/2017   Procedure: COLONOSCOPY;  Surgeon: Daneil Dolin, MD;  [facility];  Service: Endoscopy;  Laterality: N/A;  10:30am   CORONARY ARTERY BYPASS GRAFT  2003   EYE SURGERY   2001   Cataracts removed bilaterally   INCISIONAL HERNIA REPAIR N/A 01/29/2015   Procedure: Fatima Blank HERNIORRHAPHY WITH MESH;  Surgeon: Aviva Signs, MD;  Location: [facility] ORS;  Service: General;  Laterality: N/A;   INSERTION OF MESH N/A 01/29/2015   Procedure: INSERTION OF MESH;  Surgeon: Aviva Signs, MD;  Location: [facility] OR[facility];  Service: General;  Laterality: N/A;   [place] CATH AND CORS/GRAFTS ANGIOGRAPHY N/A 06/20/2017   Procedure: LEFT HEART CATH AND CORS/GRAFTS ANGIOGRAPHY;  Surgeon: Martinique, Peter M, MD;  Location: [facility];  Service: Cardiovascular;  Laterality: N/A;   ORIF ANKLE FRACTURE  08/26/2011   Procedure: OPEN REDUCTION INTERNAL FIXATION (ORIF) ANKLE FRACTURE;  Surgeon: Sanjuana Kava, MD;  Location: [facility];  Service: Orthopedics;  Laterality: Right;   POLYPECTOMY  09/07/2017   Procedure: POLYPECTOMY;  Surgeon: Daneil Dolin, MD;  [facility]: [facility];  Service: Endoscopy;;  ascending x2 (cold snare)   PROSTATE SURGERY  2012   Enlarged prostate   TRANSURETHRAL RESECTION OF PROSTATE  2012    SOCIAL HISTORY:  Social History   Socioeconomic History   Marital status: Married    Spouse name: Not on file   Number of children: Not on file   Years of education: Not on file   Highest education level: Not on file  Occupational History   Not on file  Tobacco Use   Smoking status: Former    Packs/day: 1.00    Years: 33.00    Pack years: 33.00    Types: Cigarettes    Quit date: 05/17/1987    Years since quitting: 33.6   Smokeless tobacco: Never  Vaping Use   Vaping Use: Never used  Substance and Sexual Activity   Alcohol use: No   Drug use: No   Sexual activity: Yes  Other Topics Concern   Not on file  Social History Narrative   Not on file   Social Determinants of Health   Financial Resource Strain: Low Risk    Difficulty of Paying Living Expenses: Not very hard  Food Insecurity: No Food Insecurity   Worried  About Charity fundraiser in the Last Year: Never true   Ran Out of Food in the Last Year: Never true  Transportation Needs: No Transportation Needs   Lack of Transportation (Medical): No   Lack of Transportation (Non-Medical): No  Physical Activity: Inactive   Days of Exercise per Week: 0 days   Minutes of Exercise per Session: 0 min  Stress: No Stress Concern Present   Feeling of Stress : Not at all  Social Connections: Socially Integrated   Frequency of Communication with Friends and Family: More than three times a week   Frequency of Social Gatherings with Friends and Family: Three times a week   Attends Religious Services: 1 to 4 times per year   Active Member of Clubs or Organizations: No   Attends Music therapist: 1 to 4 times per year   Marital Status: Married  Human resources officer Violence: Not At Risk   Fear of Current or Ex-Partner: No   Emotionally Abused: No   Physically Abused: No   Sexually Abused: No    FAMILY HISTORY:  Family History  Problem Relation Age of Onset   Aneurysm Mother    Heart attack Father    Colon cancer Maternal Uncle    Cancer Brother    Gastric cancer Neg Hx    Esophageal cancer Neg Hx     CURRENT MEDICATIONS:  Current Outpatient Medications  Medication Sig Dispense Refill   alendronate (FOSAMAX) 70 MG tablet Take 70 mg by mouth once a week.     amLODipine (NORVASC) 10 MG tablet TAKE ONE TABLET (10MG TOTAL) BY MOUTH DAILY 90 tablet 0   aspirin EC 81 MG tablet Take 1 tablet (81 mg total) by mouth in the morning and at bedtime. 30 tablet 11   Cholecalciferol (VITAMIN D3) 5000 units CAPS Take 5,000 Units by mouth daily.     dabrafenib mesylate (TAFINLAR) 50 MG capsule TAKE 2 CAPSULES (100 MG TOTAL) BY MOUTH 2 (TWO) TIMES DAILY. TAKE ON AN EMPTY STOMACH 1 HOUR BEFORE OR 2 HOURS AFTER MEALS. 120 capsule 3   escitalopram (LEXAPRO) 10 MG tablet Take 10 mg by mouth daily.      glimepiride (AMARYL) 2 MG tablet Take 2 mg by mouth daily  with breakfast.      insulin lispro (HUMALOG) 100 UNIT/ML injection Inject 2-10 Units into the skin 3 (three) times daily before meals. Per sliding scale     insulin lispro (HUMALOG) 100 UNIT/ML KwikPen Inject 2-10 Units into the skin See admin instructions. Inject as per sliding scale: if 200-250=2 units, 251-300=4 units, 301-350=6 units, 351-400=8 units, over 400 give 10 units and notify MD, subcuatneoulsy before meals and at bedtime for dm     isosorbide mononitrate (IMDUR) 30  MG 24 hr tablet TAKE ONE TABLET (30MG TOTAL) BY MOUTH DAILY 90 tablet 0   metFORMIN (GLUCOPHAGE) 500 MG tablet Take 1 tablet by mouth 2 (two) times daily.     methocarbamol (ROBAXIN) 500 MG tablet Take 1 tablet (500 mg total) by mouth every 8 (eight) hours as needed for muscle spasms.     metoprolol (LOPRESSOR) 100 MG tablet Take 100 mg by mouth 2 (two) times daily.     nitroGLYCERIN (NITROSTAT) 0.4 MG SL tablet Place 0.4 mg under the tongue every 5 (five) minutes as needed for chest pain.      ondansetron (ZOFRAN ODT) 4 MG disintegrating tablet 22m ODT q4 hours prn nausea/vomit 12 tablet 0   ondansetron (ZOFRAN) 8 MG tablet Take 1 tablet (8 mg total) by mouth every 8 (eight) hours as needed for nausea or vomiting. 30 tablet 2   ONE TOUCH ULTRA TEST test strip      pioglitazone (ACTOS) 45 MG tablet Take 45 mg by mouth daily.     polyethylene glycol (MIRALAX / GLYCOLAX) 17 g packet Take 17 g by mouth daily as needed for mild constipation. 14 each 0   prochlorperazine (COMPAZINE) 10 MG tablet Take 1 tablet (10 mg total) by mouth every 6 (six) hours as needed for nausea or vomiting. 30 tablet 2   tamsulosin (FLOMAX) 0.4 MG CAPS capsule Take 1 capsule (0.4 mg total) by mouth daily after supper. 30 capsule    trametinib dimethyl sulfoxide (MEKINIST) 0.5 MG tablet TAKE 2 TABLETS (1 MG TOTAL) BY MOUTH DAILY. TAKE 1 HOUR BEFORE OR 2 HOURS AFTER A MEAL. STORE REFRIGERATED IN ORIGINAL CONTAINER. 60 tablet 3   No current  facility-administered medications for this visit.    ALLERGIES:  No Known Allergies  PHYSICAL EXAM:  Performance status (ECOG): 1 - Symptomatic but completely ambulatory  Vitals:   01/15/21 1508  BP: (!) 128/57  Pulse: 65  Resp: 18  Temp: 98.3 F (36.8 C)  SpO2: 97%   Wt Readings from Last 3 Encounters:  01/15/21 174 lb 14.4 oz (79.3 kg)  09/15/20 182 lb 4.8 oz (82.7 kg)  09/10/20 178 lb (80.7 kg)   Physical Exam Vitals reviewed.  Constitutional:      Appearance: Normal appearance.  Cardiovascular:     Rate and Rhythm: Normal rate and regular rhythm.     Pulses: Normal pulses.     Heart sounds: Normal heart sounds.  Pulmonary:     Effort: Pulmonary effort is normal.     Breath sounds: Normal breath sounds.  Musculoskeletal:     Right lower leg: No edema.     Left lower leg: No edema.  Neurological:     General: No focal deficit present.     Mental Status: He is alert and oriented to person, place, and time.  Psychiatric:        Mood and Affect: Mood normal.        Behavior: Behavior normal.     LABORATORY DATA:  I have reviewed the labs as listed.  CBC Latest Ref Rng & Units 01/09/2021 09/08/2020 07/23/2020  WBC 4.0 - 10.5 K/uL 6.8 7.8 9.1  Hemoglobin 13.0 - 17.0 g/dL 10.7(L) 11.7(L) 13.4  Hematocrit 39.0 - 52.0 % 32.8(L) 34.9(L) 39.1  Platelets 150 - 400 K/uL 211 260 247   CMP Latest Ref Rng & Units 01/09/2021 09/08/2020 07/23/2020  Glucose 70 - 99 mg/dL 225(H) 287(H) 231(H)  BUN 8 - 23 mg/dL _0 Creatinine 0.61 -  1.24 mg/dL 1.44(H) 1.39(H) 1.21  Sodium 135 - 145 mmol/L 129(L) 130(L) 131(L)  Potassium 3.5 - 5.1 mmol/L 4.3 4.1 3.4(L)  Chloride 98 - 111 mmol/L 100 96(L) 96(L)  CO2 22 - 32 mmol/L _0 Calcium 8.9 - 10.3 mg/dL 8.5(L) 8.9 8.8(L)  Total Protein 6.5 - 8.1 g/dL 7.0 6.7 7.0  Total Bilirubin 0.3 - 1.2 mg/dL 0.6 0.7 1.3(H)  Alkaline Phos 38 - 126 U/L 82 119 93  AST 15 - 41 U/L _1 ALT 0 - 44 U/L _2 DIAGNOSTIC IMAGING:  I  have independently reviewed the scans and discussed with the patient. ECHOCARDIOGRAM COMPLETE  Result Date: 01/09/2021    ECHOCARDIOGRAM REPORT   Patient Name:   [name] Date of Exam: 01/09/2021 Medical Rec #:  [id number]     Height:       75.0 in Accession #:    [id number]    Weight:       182.3 lb Date of Birth:  05/23/1940     BSA:          2.110 m Patient Age:    68 years      BP:           135/75 mmHg Patient Gender: M             HR:           65 bpm. Exam Location:  [facility] Procedure: 2D Echo, Cardiac Doppler and Color Doppler Indications:    Chemo evaluation  History:        Patient has prior history of Echocardiogram examinations, most                 recent 03/05/2020. CAD, Prior CABG; Risk Factors:Diabetes,                 Hypertension and Former Smoker. Lung cancer, Chemo.  Sonographer:    Dustin Flock RDCS Referring Phys: [place]  1. Left ventricular ejection fraction, by estimation, is 55 to 60%. The left ventricle has normal function. The left ventricle has no regional wall motion abnormalities. Left ventricular diastolic parameters are indeterminate.  2. Right ventricular systolic function is normal. The right ventricular size is normal. There is normal pulmonary artery systolic pressure.  3. The mitral valve is normal in structure. Mild mitral valve regurgitation. No evidence of mitral stenosis.  4. The aortic valve is tricuspid. Aortic valve regurgitation is not visualized. No aortic stenosis is present.  5. The inferior vena cava is normal in size with greater than 50% respiratory variability, suggesting right atrial pressure of 3 mmHg. FINDINGS  Left Ventricle: Left ventricular ejection fraction, by estimation, is 55 to 60%. The left ventricle has normal function. The left ventricle has no regional wall motion abnormalities. The left ventricular internal cavity size was normal in size. There is  no left ventricular hypertrophy. Left ventricular  diastolic parameters are indeterminate. Right Ventricle: The right ventricular size is normal. No increase in right ventricular wall thickness. Right ventricular systolic function is normal. There is normal pulmonary artery systolic pressure. The tricuspid regurgitant velocity is 2.62 m/s, and  with an assumed right atrial pressure of 3 mmHg, the estimated right ventricular systolic pressure is 00.9 mmHg. Left Atrium: Left atrial size was normal in size. Right Atrium: Right atrial size was normal in size. Pericardium: There is no evidence of pericardial effusion. Mitral Valve: The mitral valve is normal  in structure. Mild mitral valve regurgitation. No evidence of mitral valve stenosis. Tricuspid Valve: The tricuspid valve is normal in structure. Tricuspid valve regurgitation is not demonstrated. No evidence of tricuspid stenosis. Aortic Valve: The aortic valve is tricuspid. Aortic valve regurgitation is not visualized. No aortic stenosis is present. Aortic valve mean gradient measures 3.1 mmHg. Aortic valve peak gradient measures 6.2 mmHg. Aortic valve area, by VTI measures 3.16 cm. Pulmonic Valve: The pulmonic valve was not well visualized. Pulmonic valve regurgitation is mild. No evidence of pulmonic stenosis. Aorta: The aortic root is normal in size and structure. Venous: The inferior vena cava is normal in size with greater than 50% respiratory variability, suggesting right atrial pressure of 3 mmHg. IAS/Shunts: The interatrial septum was not well visualized.  LEFT VENTRICLE PLAX 2D LVIDd:         5.00 cm  Diastology LVIDs:         3.50 cm  LV e' medial:    4.24 cm/s LV PW:         1.10 cm  LV E/e' medial:  17.8 LV IVS:        0.90 cm  LV e' lateral:   6.96 cm/s LVOT diam:     2.30 cm  LV E/e' lateral: 10.8 LV SV:         99 LV SV Index:   47 LVOT Area:     4.15 cm  RIGHT VENTRICLE RV Basal diam:  2.40 cm TAPSE (M-mode): 1.6 cm LEFT ATRIUM             Index       RIGHT ATRIUM           Index LA diam:         4.10 cm 1.94 cm/m  RA Area:     18.80 cm LA Vol (A2C):   37.0 ml 17.54 ml/m RA Volume:   58.10 ml  27.54 ml/m LA Vol (A4C):   40.0 ml 18.96 ml/m LA Biplane Vol: 39.1 ml 18.53 ml/m  AORTIC VALVE AV Area (Vmax):    3.44 cm AV Area (Vmean):   3.20 cm AV Area (VTI):     3.16 cm AV Vmax:           124.23 cm/s AV Vmean:          84.237 cm/s AV VTI:            0.314 m AV Peak Grad:      6.2 mmHg AV Mean Grad:      3.1 mmHg LVOT Vmax:         103.00 cm/s LVOT Vmean:        64.900 cm/s LVOT VTI:          0.239 m LVOT/AV VTI ratio: 0.76  AORTA Ao Root diam: 3.40 cm MITRAL VALVE               TRICUSPID VALVE MV Area (PHT): 2.10 cm    TR Peak grad:   27.5 mmHg MV Decel Time: 361 msec    TR Vmax:        262.00 cm/s MV E velocity: 75.40 cm/s MV A velocity: 78.00 cm/s  SHUNTS MV E/A ratio:  0.97        Systemic VTI:  0.24 m                            Systemic Diam: 2.30 cm Carlyle Dolly MD Electronically  signed by Carlyle Dolly MD Signature Date/Time: 01/09/2021/11:12:54 AM    Final      ASSESSMENT:  1.  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 -Last CT of the chest on 09/26/2019 showed stable lung nodules. -CT chest with contrast on 03/11/2020 shows innumerable scattered bilateral pulmonary nodules under 1 cm in diameter, not appreciably changed from prior scan.   2.  High risk drug monitoring: -Echocardiogram on 07/16/2019 shows EF 60-65% with grade 1 diastolic dysfunction. -Echo on 03/05/2020 with EF 60-65%.   PLAN:  1.  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 - He sustained hip fracture in February.  Since then he lost some weight. - He is tolerating dabrafenib and trametinib very  well. - Reviewed his labs today which showed mild hyponatremia with 129.  Creatinine is 1.44.  CBC shows hemoglobin 10.7 with normal white count and platelet count.  TSH is 1.32. - We will continue dabrafenib 100 mg twice daily and trametinib 1 mg daily. - Reevaluate in 3 months.  We will repeat CT of the chest with contrast.  We will also check routine labs.   2.  High risk drug monitoring: - Echocardiogram on 03/05/2020 with EF 60 to 65%. - Reviewed echocardiogram from 01/09/2021 with EF 55-60%. - No clinical signs of PND or orthopnea.  We will closely monitor for any further worsening.   3.  Hypertension: - Blood pressure today is 128/57.  Continue metoprolol and amlodipine.   4.  Diabetes: - Continue metformin, Actos and Januvia.   5.  Normocytic anemia: - His hemoglobin today dropped to 10.7.  He had recent drop in hemoglobin after hip fracture. - We will check his ferritin and iron panel at next visit.  He denies any bleeding per rectum or melena.   Orders placed this encounter:  No orders of the defined types were placed in this encounter.    Derek Jack, MD [place] [phone]   I, Thana Ates, am acting as a scribe for Dr. Derek Jack.  I, Derek Jack MD, have reviewed the above documentation for accuracy and completeness, and I agree with the above.

## 2021-01-15 ENCOUNTER — Other Ambulatory Visit: Payer: Self-pay

## 2021-01-15 ENCOUNTER — Other Ambulatory Visit (HOSPITAL_COMMUNITY): Payer: Self-pay | Admitting: *Deleted

## 2021-01-15 ENCOUNTER — Inpatient Hospital Stay (HOSPITAL_COMMUNITY): Payer: Medicare Other | Attending: Hematology | Admitting: Hematology

## 2021-01-15 VITALS — BP 128/57 | HR 65 | Temp 98.3°F | Resp 18 | Wt 174.9 lb

## 2021-01-15 DIAGNOSIS — Z1509 Genetic susceptibility to other malignant neoplasm: Secondary | ICD-10-CM | POA: Diagnosis not present

## 2021-01-15 DIAGNOSIS — D649 Anemia, unspecified: Secondary | ICD-10-CM | POA: Insufficient documentation

## 2021-01-15 DIAGNOSIS — C3490 Malignant neoplasm of unspecified part of unspecified bronchus or lung: Secondary | ICD-10-CM | POA: Diagnosis not present

## 2021-01-15 DIAGNOSIS — Z79899 Other long term (current) drug therapy: Secondary | ICD-10-CM | POA: Insufficient documentation

## 2021-01-15 DIAGNOSIS — R634 Abnormal weight loss: Secondary | ICD-10-CM | POA: Insufficient documentation

## 2021-01-15 DIAGNOSIS — E871 Hypo-osmolality and hyponatremia: Secondary | ICD-10-CM | POA: Diagnosis not present

## 2021-01-15 NOTE — Patient Instructions (Addendum)
[place] at [facility] Discharge Instructions  You were seen today by Dr. Delton Coombes. He went over your recent results and scans. You will be scheduled for a CT scan of your chest prior to your next visit. Dr. Delton Coombes will see you back in 3 months for labs and follow up.   Thank you for choosing [place] at [facility] to provide your oncology and hematology care.  To afford each patient quality time with our provider, please arrive at least 15 minutes before your scheduled appointment time.   If you have a lab appointment with the [facility] please come in thru the Main Entrance and check in at the main information desk  You need to re-schedule your appointment should you arrive 10 or more minutes late.  We strive to give you quality time with our providers, and arriving late affects you and other patients whose appointments are after yours.  Also, if you no show three or more times for appointments you may be dismissed from the clinic at the providers discretion.     Again, thank you for choosing [facility].  Our hope is that these requests will decrease the amount of time that you wait before being seen by our physicians.       _____________________________________________________________  Should you have questions after your visit to [facility], please contact our office at [phone] between the hours of 8:00 a.m. and 4:30 p.m.  Voicemails left after 4:00 p.m. will not be returned until the following business day.  For prescription refill requests, have your pharmacy contact our office and allow 72 hours.    Cancer Center Support Programs:   > Cancer Support Group  2nd Tuesday of the month 1pm-2pm, Journey Room

## 2021-01-22 ENCOUNTER — Other Ambulatory Visit: Payer: Self-pay | Admitting: Student

## 2021-01-22 DIAGNOSIS — I10 Essential (primary) hypertension: Secondary | ICD-10-CM

## 2021-01-26 ENCOUNTER — Telehealth: Payer: Self-pay

## 2021-01-26 NOTE — Telephone Encounter (Signed)
Lmom for patient to call back and schedule past due appointment.  Patient needs appt to get more refills.    Prev pt of Dr Raliegh Ip  - assign to new doctor

## 2021-02-04 ENCOUNTER — Other Ambulatory Visit (HOSPITAL_COMMUNITY): Payer: Self-pay

## 2021-02-04 DIAGNOSIS — C3490 Malignant neoplasm of unspecified part of unspecified bronchus or lung: Secondary | ICD-10-CM

## 2021-02-04 MED ORDER — TRAMETINIB DIMETHYL SULFOXIDE 0.5 MG PO TABS: ORAL_TABLET | ORAL | 3 refills | Status: DC

## 2021-02-04 MED ORDER — DABRAFENIB MESYLATE 50 MG PO CAPS: ORAL_CAPSULE | ORAL | 3 refills | Status: DC

## 2021-02-04 NOTE — Telephone Encounter (Signed)
Chart reviewed. Dabrafenib and Trametinib refilled per last office visit note with Dr. Delton Coombes.

## 2021-03-04 NOTE — Telephone Encounter (Signed)
Lmom for patient to call back to get appointment - for refills

## 2021-03-20 ENCOUNTER — Inpatient Hospital Stay (HOSPITAL_COMMUNITY)
Admission: EM | Admit: 2021-03-20 | Discharge: 2021-03-30 | DRG: 871 | Disposition: A | Discharge status: Skilled Nursing Facility | Payer: Medicare Other | Attending: Family Medicine | Admitting: Family Medicine

## 2021-03-20 ENCOUNTER — Emergency Department (HOSPITAL_COMMUNITY): Payer: Medicare Other

## 2021-03-20 ENCOUNTER — Other Ambulatory Visit: Payer: Self-pay

## 2021-03-20 DIAGNOSIS — E119 Type 2 diabetes mellitus without complications: Secondary | ICD-10-CM

## 2021-03-20 DIAGNOSIS — I2119 ST elevation (STEMI) myocardial infarction involving other coronary artery of inferior wall: Secondary | ICD-10-CM

## 2021-03-20 DIAGNOSIS — R6520 Severe sepsis without septic shock: Secondary | ICD-10-CM | POA: Diagnosis present

## 2021-03-20 DIAGNOSIS — N183 Chronic kidney disease, stage 3 unspecified: Secondary | ICD-10-CM | POA: Diagnosis present

## 2021-03-20 DIAGNOSIS — R339 Retention of urine, unspecified: Secondary | ICD-10-CM | POA: Diagnosis not present

## 2021-03-20 DIAGNOSIS — Z515 Encounter for palliative care: Secondary | ICD-10-CM | POA: Diagnosis not present

## 2021-03-20 DIAGNOSIS — D72829 Elevated white blood cell count, unspecified: Secondary | ICD-10-CM | POA: Diagnosis not present

## 2021-03-20 DIAGNOSIS — I252 Old myocardial infarction: Secondary | ICD-10-CM | POA: Diagnosis not present

## 2021-03-20 DIAGNOSIS — I480 Paroxysmal atrial fibrillation: Secondary | ICD-10-CM | POA: Diagnosis present

## 2021-03-20 DIAGNOSIS — E1122 Type 2 diabetes mellitus with diabetic chronic kidney disease: Secondary | ICD-10-CM | POA: Diagnosis present

## 2021-03-20 DIAGNOSIS — Z7982 Long term (current) use of aspirin: Secondary | ICD-10-CM

## 2021-03-20 DIAGNOSIS — I499 Cardiac arrhythmia, unspecified: Secondary | ICD-10-CM | POA: Diagnosis not present

## 2021-03-20 DIAGNOSIS — E872 Acidosis, unspecified: Secondary | ICD-10-CM | POA: Diagnosis not present

## 2021-03-20 DIAGNOSIS — E876 Hypokalemia: Secondary | ICD-10-CM | POA: Diagnosis not present

## 2021-03-20 DIAGNOSIS — N1832 Chronic kidney disease, stage 3b: Secondary | ICD-10-CM | POA: Diagnosis not present

## 2021-03-20 DIAGNOSIS — I25119 Atherosclerotic heart disease of native coronary artery with unspecified angina pectoris: Secondary | ICD-10-CM | POA: Diagnosis not present

## 2021-03-20 DIAGNOSIS — Z79899 Other long term (current) drug therapy: Secondary | ICD-10-CM

## 2021-03-20 DIAGNOSIS — N401 Enlarged prostate with lower urinary tract symptoms: Secondary | ICD-10-CM | POA: Diagnosis present

## 2021-03-20 DIAGNOSIS — N139 Obstructive and reflux uropathy, unspecified: Secondary | ICD-10-CM | POA: Diagnosis not present

## 2021-03-20 DIAGNOSIS — C3490 Malignant neoplasm of unspecified part of unspecified bronchus or lung: Secondary | ICD-10-CM | POA: Diagnosis present

## 2021-03-20 DIAGNOSIS — R279 Unspecified lack of coordination: Secondary | ICD-10-CM | POA: Diagnosis not present

## 2021-03-20 DIAGNOSIS — R6889 Other general symptoms and signs: Secondary | ICD-10-CM | POA: Diagnosis not present

## 2021-03-20 DIAGNOSIS — A4181 Sepsis due to Enterococcus: Secondary | ICD-10-CM | POA: Diagnosis not present

## 2021-03-20 DIAGNOSIS — C799 Secondary malignant neoplasm of unspecified site: Secondary | ICD-10-CM | POA: Diagnosis not present

## 2021-03-20 DIAGNOSIS — J9601 Acute respiratory failure with hypoxia: Secondary | ICD-10-CM | POA: Diagnosis not present

## 2021-03-20 DIAGNOSIS — R739 Hyperglycemia, unspecified: Secondary | ICD-10-CM | POA: Diagnosis not present

## 2021-03-20 DIAGNOSIS — I5021 Acute systolic (congestive) heart failure: Secondary | ICD-10-CM | POA: Diagnosis present

## 2021-03-20 DIAGNOSIS — J189 Pneumonia, unspecified organism: Secondary | ICD-10-CM | POA: Diagnosis not present

## 2021-03-20 DIAGNOSIS — I700 Atherosclerosis of aorta: Secondary | ICD-10-CM | POA: Diagnosis present

## 2021-03-20 DIAGNOSIS — E871 Hypo-osmolality and hyponatremia: Secondary | ICD-10-CM | POA: Diagnosis present

## 2021-03-20 DIAGNOSIS — R338 Other retention of urine: Secondary | ICD-10-CM | POA: Diagnosis not present

## 2021-03-20 DIAGNOSIS — Z951 Presence of aortocoronary bypass graft: Secondary | ICD-10-CM

## 2021-03-20 DIAGNOSIS — I10 Essential (primary) hypertension: Secondary | ICD-10-CM | POA: Diagnosis not present

## 2021-03-20 DIAGNOSIS — I5022 Chronic systolic (congestive) heart failure: Secondary | ICD-10-CM | POA: Diagnosis not present

## 2021-03-20 DIAGNOSIS — I214 Non-ST elevation (NSTEMI) myocardial infarction: Secondary | ICD-10-CM

## 2021-03-20 DIAGNOSIS — Z7902 Long term (current) use of antithrombotics/antiplatelets: Secondary | ICD-10-CM

## 2021-03-20 DIAGNOSIS — F32A Depression, unspecified: Secondary | ICD-10-CM | POA: Diagnosis present

## 2021-03-20 DIAGNOSIS — I4891 Unspecified atrial fibrillation: Secondary | ICD-10-CM

## 2021-03-20 DIAGNOSIS — R319 Hematuria, unspecified: Secondary | ICD-10-CM | POA: Diagnosis not present

## 2021-03-20 DIAGNOSIS — M6281 Muscle weakness (generalized): Secondary | ICD-10-CM | POA: Diagnosis not present

## 2021-03-20 DIAGNOSIS — E1151 Type 2 diabetes mellitus with diabetic peripheral angiopathy without gangrene: Secondary | ICD-10-CM | POA: Diagnosis not present

## 2021-03-20 DIAGNOSIS — Z960 Presence of urogenital implants: Secondary | ICD-10-CM | POA: Diagnosis not present

## 2021-03-20 DIAGNOSIS — B952 Enterococcus as the cause of diseases classified elsewhere: Secondary | ICD-10-CM

## 2021-03-20 DIAGNOSIS — N1831 Chronic kidney disease, stage 3a: Secondary | ICD-10-CM | POA: Diagnosis not present

## 2021-03-20 DIAGNOSIS — K8689 Other specified diseases of pancreas: Secondary | ICD-10-CM | POA: Diagnosis not present

## 2021-03-20 DIAGNOSIS — R52 Pain, unspecified: Secondary | ICD-10-CM | POA: Diagnosis not present

## 2021-03-20 DIAGNOSIS — Z9079 Acquired absence of other genital organ(s): Secondary | ICD-10-CM

## 2021-03-20 DIAGNOSIS — E1159 Type 2 diabetes mellitus with other circulatory complications: Secondary | ICD-10-CM | POA: Diagnosis not present

## 2021-03-20 DIAGNOSIS — R079 Chest pain, unspecified: Secondary | ICD-10-CM | POA: Diagnosis not present

## 2021-03-20 DIAGNOSIS — Z8249 Family history of ischemic heart disease and other diseases of the circulatory system: Secondary | ICD-10-CM

## 2021-03-20 DIAGNOSIS — R2689 Other abnormalities of gait and mobility: Secondary | ICD-10-CM | POA: Diagnosis not present

## 2021-03-20 DIAGNOSIS — I130 Hypertensive heart and chronic kidney disease with heart failure and stage 1 through stage 4 chronic kidney disease, or unspecified chronic kidney disease: Secondary | ICD-10-CM | POA: Diagnosis not present

## 2021-03-20 DIAGNOSIS — Z794 Long term (current) use of insulin: Secondary | ICD-10-CM

## 2021-03-20 DIAGNOSIS — D631 Anemia in chronic kidney disease: Secondary | ICD-10-CM | POA: Diagnosis present

## 2021-03-20 DIAGNOSIS — Z20822 Contact with and (suspected) exposure to covid-19: Secondary | ICD-10-CM | POA: Diagnosis present

## 2021-03-20 DIAGNOSIS — Z7401 Bed confinement status: Secondary | ICD-10-CM | POA: Diagnosis not present

## 2021-03-20 DIAGNOSIS — I340 Nonrheumatic mitral (valve) insufficiency: Secondary | ICD-10-CM | POA: Diagnosis not present

## 2021-03-20 DIAGNOSIS — I213 ST elevation (STEMI) myocardial infarction of unspecified site: Secondary | ICD-10-CM | POA: Diagnosis present

## 2021-03-20 DIAGNOSIS — I2510 Atherosclerotic heart disease of native coronary artery without angina pectoris: Secondary | ICD-10-CM

## 2021-03-20 DIAGNOSIS — Z66 Do not resuscitate: Secondary | ICD-10-CM | POA: Diagnosis not present

## 2021-03-20 DIAGNOSIS — Z743 Need for continuous supervision: Secondary | ICD-10-CM | POA: Diagnosis not present

## 2021-03-20 DIAGNOSIS — A419 Sepsis, unspecified organism: Secondary | ICD-10-CM

## 2021-03-20 DIAGNOSIS — R7881 Bacteremia: Secondary | ICD-10-CM

## 2021-03-20 DIAGNOSIS — Z8673 Personal history of transient ischemic attack (TIA), and cerebral infarction without residual deficits: Secondary | ICD-10-CM

## 2021-03-20 DIAGNOSIS — E8809 Other disorders of plasma-protein metabolism, not elsewhere classified: Secondary | ICD-10-CM | POA: Diagnosis present

## 2021-03-20 DIAGNOSIS — R918 Other nonspecific abnormal finding of lung field: Secondary | ICD-10-CM | POA: Diagnosis not present

## 2021-03-20 DIAGNOSIS — J181 Lobar pneumonia, unspecified organism: Secondary | ICD-10-CM | POA: Diagnosis present

## 2021-03-20 DIAGNOSIS — Z7984 Long term (current) use of oral hypoglycemic drugs: Secondary | ICD-10-CM

## 2021-03-20 DIAGNOSIS — Z9049 Acquired absence of other specified parts of digestive tract: Secondary | ICD-10-CM

## 2021-03-20 DIAGNOSIS — I361 Nonrheumatic tricuspid (valve) insufficiency: Secondary | ICD-10-CM | POA: Diagnosis not present

## 2021-03-20 DIAGNOSIS — Z87891 Personal history of nicotine dependence: Secondary | ICD-10-CM

## 2021-03-20 DIAGNOSIS — R5381 Other malaise: Secondary | ICD-10-CM | POA: Diagnosis not present

## 2021-03-20 DIAGNOSIS — N3289 Other specified disorders of bladder: Secondary | ICD-10-CM | POA: Diagnosis not present

## 2021-03-20 DIAGNOSIS — E869 Volume depletion, unspecified: Secondary | ICD-10-CM | POA: Diagnosis present

## 2021-03-20 DIAGNOSIS — Z7189 Other specified counseling: Secondary | ICD-10-CM | POA: Diagnosis not present

## 2021-03-20 DIAGNOSIS — Z7983 Long term (current) use of bisphosphonates: Secondary | ICD-10-CM

## 2021-03-20 LAB — CBC WITH DIFFERENTIAL/PLATELET
Abs Immature Granulocytes: 0.26 10*3/uL — ABNORMAL HIGH (ref 0.00–0.07)
Basophils Absolute: 0 10*3/uL (ref 0.0–0.1)
Basophils Relative: 0 %
Eosinophils Absolute: 0 10*3/uL (ref 0.0–0.5)
Eosinophils Relative: 0 %
HCT: 35.3 % — ABNORMAL LOW (ref 39.0–52.0)
Hemoglobin: 11.6 g/dL — ABNORMAL LOW (ref 13.0–17.0)
Immature Granulocytes: 1 %
Lymphocytes Relative: 10 %
Lymphs Abs: 1.9 10*3/uL (ref 0.7–4.0)
MCH: 24.9 pg — ABNORMAL LOW (ref 26.0–34.0)
MCHC: 32.9 g/dL (ref 30.0–36.0)
MCV: 75.8 fL — ABNORMAL LOW (ref 80.0–100.0)
Monocytes Absolute: 0.7 10*3/uL (ref 0.1–1.0)
Monocytes Relative: 4 %
Neutro Abs: 16.6 10*3/uL — ABNORMAL HIGH (ref 1.7–7.7)
Neutrophils Relative %: 85 %
Platelets: 407 10*3/uL — ABNORMAL HIGH (ref 150–400)
RBC: 4.66 MIL/uL (ref 4.22–5.81)
RDW: 15.2 % (ref 11.5–15.5)
WBC: 19.5 10*3/uL — ABNORMAL HIGH (ref 4.0–10.5)
nRBC: 0 % (ref 0.0–0.2)

## 2021-03-20 LAB — COMPREHENSIVE METABOLIC PANEL
ALT: 14 U/L (ref 0–44)
ALT: 12 U/L (ref 0–44)
AST: 32 U/L (ref 15–41)
AST: 27 U/L (ref 15–41)
Albumin: 2.6 g/dL — ABNORMAL LOW (ref 3.5–5.0)
Albumin: 2 g/dL — ABNORMAL LOW (ref 3.5–5.0)
Alkaline Phosphatase: 130 U/L — ABNORMAL HIGH (ref 38–126)
Alkaline Phosphatase: 100 U/L (ref 38–126)
Anion gap: 13 (ref 5–15)
Anion gap: 12 (ref 5–15)
BUN: 15 mg/dL (ref 8–23)
BUN: 14 mg/dL (ref 8–23)
CO2: 22 mmol/L (ref 22–32)
CO2: 17 mmol/L — ABNORMAL LOW (ref 22–32)
Calcium: 8.1 mg/dL — ABNORMAL LOW (ref 8.9–10.3)
Calcium: 7.1 mg/dL — ABNORMAL LOW (ref 8.9–10.3)
Chloride: 89 mmol/L — ABNORMAL LOW (ref 98–111)
Chloride: 98 mmol/L (ref 98–111)
Creatinine, Ser: 1.49 mg/dL — ABNORMAL HIGH (ref 0.61–1.24)
Creatinine, Ser: 1.34 mg/dL — ABNORMAL HIGH (ref 0.61–1.24)
GFR, Estimated: 47 mL/min — ABNORMAL LOW (ref >60)
GFR, Estimated: 53 mL/min — ABNORMAL LOW (ref >60)
Glucose, Bld: 237 mg/dL — ABNORMAL HIGH (ref 70–99)
Glucose, Bld: 218 mg/dL — ABNORMAL HIGH (ref 70–99)
Potassium: 2.8 mmol/L — ABNORMAL LOW (ref 3.5–5.1)
Potassium: 3.3 mmol/L — ABNORMAL LOW (ref 3.5–5.1)
Sodium: 124 mmol/L — ABNORMAL LOW (ref 135–145)
Sodium: 127 mmol/L — ABNORMAL LOW (ref 135–145)
Total Bilirubin: 1.1 mg/dL (ref 0.3–1.2)
Total Bilirubin: 1.1 mg/dL (ref 0.3–1.2)
Total Protein: 8.3 g/dL — ABNORMAL HIGH (ref 6.5–8.1)
Total Protein: 6.4 g/dL — ABNORMAL LOW (ref 6.5–8.1)

## 2021-03-20 LAB — TROPONIN I (HIGH SENSITIVITY)
Troponin I (High Sensitivity): 3329 ng/L (ref <18)
Troponin I (High Sensitivity): 3016 ng/L (ref <18)
Troponin I (High Sensitivity): 3762 ng/L (ref <18)

## 2021-03-20 LAB — PROTIME-INR
INR: 1.3 — ABNORMAL HIGH (ref 0.8–1.2)
Prothrombin Time: 16.6 seconds — ABNORMAL HIGH (ref 11.4–15.2)

## 2021-03-20 LAB — LACTIC ACID, PLASMA
Lactic Acid, Venous: 2.9 mmol/L (ref 0.5–1.9)
Lactic Acid, Venous: 2.1 mmol/L (ref 0.5–1.9)
Lactic Acid, Venous: 1.7 mmol/L (ref 0.5–1.9)

## 2021-03-20 LAB — APTT: aPTT: 39 seconds — ABNORMAL HIGH (ref 24–36)

## 2021-03-20 LAB — RESP PANEL BY RT-PCR (FLU A&B, COVID) ARPGX2
Influenza A by PCR: NEGATIVE
Influenza B by PCR: NEGATIVE
SARS Coronavirus 2 by RT PCR: NEGATIVE

## 2021-03-20 MED ORDER — ATORVASTATIN CALCIUM 40 MG PO TABS
80.0000 mg | ORAL_TABLET | Freq: Every day | ORAL | Status: DC
  Administered 2021-03-20 – 2021-03-30 (×11): 80 mg via ORAL
  Filled 2021-03-20 (×10): qty 2
  Filled 2021-03-20: qty 4

## 2021-03-20 MED ORDER — ESCITALOPRAM OXALATE 10 MG PO TABS
10.0000 mg | ORAL_TABLET | Freq: Every day | ORAL | Status: DC
  Administered 2021-03-20 – 2021-03-30 (×11): 10 mg via ORAL
  Filled 2021-03-20 (×11): qty 1

## 2021-03-20 MED ORDER — NITROGLYCERIN 0.4 MG SL SUBL
0.4000 mg | SUBLINGUAL_TABLET | SUBLINGUAL | Status: DC | PRN
  Administered 2021-03-21 – 2021-03-24 (×4): 0.4 mg via SUBLINGUAL
  Filled 2021-03-20 (×3): qty 1

## 2021-03-20 MED ORDER — POTASSIUM CHLORIDE 10 MEQ/100ML IV SOLN
10.0000 meq | Freq: Once | INTRAVENOUS | Status: AC
  Administered 2021-03-20: 10 meq via INTRAVENOUS
  Filled 2021-03-20: qty 100

## 2021-03-20 MED ORDER — ONDANSETRON HCL 4 MG/2ML IJ SOLN: 4.0000 mg | Freq: Four times a day (QID) | INTRAMUSCULAR | Status: DC | PRN

## 2021-03-20 MED ORDER — SODIUM CHLORIDE 0.9 % IV BOLUS (SEPSIS)
500.0000 mL | Freq: Once | INTRAVENOUS | Status: AC
  Administered 2021-03-20: 500 mL via INTRAVENOUS

## 2021-03-20 MED ORDER — ACETAMINOPHEN 650 MG RE SUPP: 650.0000 mg | Freq: Four times a day (QID) | RECTAL | Status: DC | PRN

## 2021-03-20 MED ORDER — ONDANSETRON HCL 4 MG PO TABS: 4.0000 mg | ORAL_TABLET | Freq: Four times a day (QID) | ORAL | Status: DC | PRN

## 2021-03-20 MED ORDER — ASPIRIN 300 MG RE SUPP: 300.0000 mg | RECTAL | Status: AC

## 2021-03-20 MED ORDER — SODIUM CHLORIDE 0.9 % IV BOLUS (SEPSIS)
1000.0000 mL | Freq: Once | INTRAVENOUS | Status: AC
  Administered 2021-03-20: 1000 mL via INTRAVENOUS

## 2021-03-20 MED ORDER — METRONIDAZOLE 500 MG/100ML IV SOLN
500.0000 mg | Freq: Two times a day (BID) | INTRAVENOUS | Status: DC
  Administered 2021-03-20 – 2021-03-21 (×3): 500 mg via INTRAVENOUS
  Filled 2021-03-20 (×3): qty 100

## 2021-03-20 MED ORDER — SODIUM CHLORIDE 0.9 % IV SOLN
2.0000 g | Freq: Two times a day (BID) | INTRAVENOUS | Status: DC
  Administered 2021-03-21 (×3): 2 g via INTRAVENOUS
  Filled 2021-03-20 (×3): qty 2

## 2021-03-20 MED ORDER — ONDANSETRON HCL 4 MG/2ML IJ SOLN
4.0000 mg | Freq: Four times a day (QID) | INTRAMUSCULAR | Status: DC | PRN
  Administered 2021-03-24: 4 mg via INTRAVENOUS
  Filled 2021-03-20: qty 2

## 2021-03-20 MED ORDER — TAMSULOSIN HCL 0.4 MG PO CAPS
0.4000 mg | ORAL_CAPSULE | Freq: Every day | ORAL | Status: DC
  Administered 2021-03-21 – 2021-03-29 (×9): 0.4 mg via ORAL
  Filled 2021-03-20 (×10): qty 1

## 2021-03-20 MED ORDER — SODIUM CHLORIDE 0.9 % IV SOLN
2.0000 g | INTRAVENOUS | Status: DC
  Administered 2021-03-20: 2 g via INTRAVENOUS
  Filled 2021-03-20: qty 20

## 2021-03-20 MED ORDER — ASPIRIN EC 81 MG PO TBEC
81.0000 mg | DELAYED_RELEASE_TABLET | Freq: Every day | ORAL | Status: DC
  Administered 2021-03-21 – 2021-03-25 (×5): 81 mg via ORAL
  Filled 2021-03-20 (×5): qty 1

## 2021-03-20 MED ORDER — MORPHINE SULFATE (PF) 2 MG/ML IV SOLN: 2.0000 mg | INTRAVENOUS | Status: DC | PRN

## 2021-03-20 MED ORDER — POLYETHYLENE GLYCOL 3350 17 G PO PACK: 17.0000 g | PACK | Freq: Every day | ORAL | Status: DC | PRN

## 2021-03-20 MED ORDER — SODIUM CHLORIDE 0.9 % IV SOLN
500.0000 mg | INTRAVENOUS | Status: DC
  Administered 2021-03-20: 500 mg via INTRAVENOUS
  Filled 2021-03-20: qty 500

## 2021-03-20 MED ORDER — POTASSIUM CHLORIDE 10 MEQ/100ML IV SOLN
10.0000 meq | INTRAVENOUS | Status: AC
  Administered 2021-03-20 – 2021-03-21 (×3): 10 meq via INTRAVENOUS
  Filled 2021-03-20 (×3): qty 100

## 2021-03-20 MED ORDER — LACTATED RINGERS IV SOLN: INTRAVENOUS | Status: AC

## 2021-03-20 MED ORDER — VANCOMYCIN HCL 1500 MG/300ML IV SOLN
1500.0000 mg | Freq: Once | INTRAVENOUS | Status: AC
  Administered 2021-03-21: 1500 mg via INTRAVENOUS
  Filled 2021-03-20: qty 300

## 2021-03-20 MED ORDER — VANCOMYCIN HCL 1000 MG/200ML IV SOLN
1000.0000 mg | INTRAVENOUS | Status: DC
  Administered 2021-03-22: 1000 mg via INTRAVENOUS
  Filled 2021-03-20: qty 200

## 2021-03-20 MED ORDER — NITROGLYCERIN 0.4 MG SL SUBL: 0.4000 mg | SUBLINGUAL_TABLET | SUBLINGUAL | Status: DC | PRN

## 2021-03-20 MED ORDER — ASPIRIN 81 MG PO CHEW
324.0000 mg | CHEWABLE_TABLET | ORAL | Status: AC
  Administered 2021-03-20: 324 mg via ORAL
  Filled 2021-03-20: qty 4

## 2021-03-20 MED ORDER — ACETAMINOPHEN 325 MG PO TABS: 650.0000 mg | ORAL_TABLET | ORAL | Status: DC | PRN

## 2021-03-20 MED ORDER — HEPARIN BOLUS VIA INFUSION
4000.0000 [IU] | Freq: Once | INTRAVENOUS | Status: AC
  Administered 2021-03-20: 4000 [IU] via INTRAVENOUS

## 2021-03-20 MED ORDER — OXYCODONE HCL 5 MG PO TABS: 5.0000 mg | ORAL_TABLET | ORAL | Status: DC | PRN

## 2021-03-20 MED ORDER — ONDANSETRON HCL 4 MG/2ML IJ SOLN
4.0000 mg | Freq: Once | INTRAMUSCULAR | Status: AC
  Administered 2021-03-20: 4 mg via INTRAVENOUS
  Filled 2021-03-20: qty 2

## 2021-03-20 MED ORDER — INSULIN ASPART 100 UNIT/ML IJ SOLN
0.0000 [IU] | INTRAMUSCULAR | Status: DC
Start: 2021-03-20 — End: 2021-03-22
  Administered 2021-03-21 (×2): 1 [IU] via SUBCUTANEOUS
  Administered 2021-03-21: 2 [IU] via SUBCUTANEOUS
  Administered 2021-03-21: 3 [IU] via SUBCUTANEOUS
  Administered 2021-03-21 – 2021-03-22 (×3): 1 [IU] via SUBCUTANEOUS

## 2021-03-20 MED ORDER — HEPARIN (PORCINE) 25000 UT/250ML-% IV SOLN
1500.0000 [IU]/h | INTRAVENOUS | Status: DC
  Administered 2021-03-20: 1100 [IU]/h via INTRAVENOUS
  Administered 2021-03-21: 1300 [IU]/h via INTRAVENOUS
  Administered 2021-03-22 – 2021-03-25 (×5): 1500 [IU]/h via INTRAVENOUS
  Filled 2021-03-20 (×7): qty 250

## 2021-03-20 MED ORDER — ACETAMINOPHEN 325 MG PO TABS
650.0000 mg | ORAL_TABLET | Freq: Four times a day (QID) | ORAL | Status: DC | PRN
  Administered 2021-03-29: 650 mg via ORAL
  Filled 2021-03-20 (×2): qty 2

## 2021-03-20 MED ORDER — MAGNESIUM SULFATE 2 GM/50ML IV SOLN
2.0000 g | INTRAVENOUS | Status: AC
  Administered 2021-03-20: 2 g via INTRAVENOUS
  Filled 2021-03-20: qty 50

## 2021-03-20 NOTE — ED Triage Notes (Signed)
Patient brought in by [facility].  EMS states they were called out for a sick call. Patient has a history of lung CA stage IV.  Also states that the patient hasn't had anything to eat or drink in the past 7-8 says.  Also has a cough and states that he fell a couple of days ago.  EMS states that on their 12- lead the patient showed ST depression in leads 4, 5, and 6.    Vitals: BP: 150/100 Resp: 30 O2 sat of 84% on RA.  States they placed him on Co@ [place] at 6 lpm and patient maintained at 95%.   Also states Co2 is 19.

## 2021-03-20 NOTE — H&P (Signed)
[facility] History and Physical  KAYVON MO PXT:[id number] DOB: [birth year] DOA: 03/20/2021  Referring physician: Dr. Sabra Heck PCP: [name], MD   Chief Complaint: chest pain  HPI: [name] is a 80 y.o. male with hx of metastatic lung cancer currently on palliative immunotherapy, CAD status post four-vessel CABG, CKD, type 2 diabetes, who presents with chest pain.  Patient reports that he has felt unwell for several days.  His chest has been hurting but since just prior to presenting to the ED he has felt like an elephant is sitting on his chest.  He also feels very short of breath currently.  Also notes having a significant cough as well as being very weak.  Has not had much to eat or drink in the past couple of days either.  In the ED vital signs notable for hypoxia to the low 90s requiring 4 L nasal cannula, normal to hypertensive blood pressures, heart rate ranging from the 130s to 150s, and respirations ranging from the 20s to low 40s.  CMP notable for hyponatremia to 124, potassium 2.8, chloride 89, glucose 237, creatinine 1.49, calcium 8.1; CBC notable for white count of 19.5, remainder unremarkable.  Initial troponin was 3329, initial lactic acid was 2.9 and down trended to 2.1 on recheck.  COVID and influenza swabs were negative.  Chest x-ray was concerning for possible bilateral lower lobe opacities possibly representing infection.  Initial EKG showed some ST depressions but no clear STEMI, on repeat newly evident STEMI was apparent in leads II, III, and aVF, with reciprocal changes seen in the anterior leads.  Patient was started on broad-spectrum antibiotics, heparin drip, and IV fluids.  ED provider consulted with cardiology, who agreed with STEMI diagnosis but did not recommend Cath Lab activation at this time given critical illness, however did recommend transfer to [facility] for further treatment and stabilization.  During ED evaluation patient had expressed to that provider  that he wished to be DNR/DNI.  I asked patient the same thing and he again reiterated that he wishes to be DNR/DNI.  I asked him what his understanding was of his cancer diagnosis, and he stated that he has been in remission for many years.  He was aware that it was metastatic but did not understand the meaning of this, only that it was not growing.  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   LIMA to LAD, SVG to D1, SVG to OM2, SVG to RCA, open vein harvest right thigh and lower leg   Spontaneous pneumothorax 09/15/2010   right   Past Surgical History:  Procedure Laterality Date   ANKLE FRACTURE SURGERY  2008   [facility]   APPENDECTOMY  [phone]   CHEST TUBE INSERTION Right 09/15/2010   Dr Servando Snare - spontaneous PTX   CHOLECYSTECTOMY  2008   COLONOSCOPY N/A 09/07/2017   Procedure: COLONOSCOPY;  Surgeon: Daneil Dolin, MD;  [facility];  Service: Endoscopy;  Laterality: N/A;  10:30am   CORONARY ARTERY BYPASS GRAFT  2003   EYE SURGERY  2001   Cataracts removed bilaterally   INCISIONAL HERNIA REPAIR N/A 01/29/2015   Procedure: Fatima Blank HERNIORRHAPHY WITH MESH;  Surgeon: Aviva Signs, MD;  Location: [facility];  Service: General;  Laterality: N/A;   INSERTION OF MESH N/A 01/29/2015   Procedure: INSERTION OF MESH;  Surgeon: Aviva Signs, MD;  Location: [facility];  Service: General;  Laterality: N/A;   [place] CATH AND CORS/GRAFTS ANGIOGRAPHY N/A 06/20/2017   Procedure: LEFT HEART CATH AND CORS/GRAFTS ANGIOGRAPHY;  Surgeon: Martinique, Peter M, MD;  Location: [place] [facility];  Service: Cardiovascular;  Laterality: N/A;   ORIF ANKLE FRACTURE  08/26/2011   Procedure: OPEN REDUCTION INTERNAL  FIXATION (ORIF) ANKLE FRACTURE;  Surgeon: Sanjuana Kava, MD;  Location: [facility];  Service: Orthopedics;  Laterality: Right;   POLYPECTOMY  09/07/2017   Procedure: POLYPECTOMY;  Surgeon: Daneil Dolin, MD;  Location: [facility];  Service: Endoscopy;;  ascending x2 (cold snare)   PROSTATE SURGERY  2012   Enlarged prostate   TRANSURETHRAL RESECTION OF PROSTATE  2012   Social History:  reports that he quit smoking about 33 years ago. He has a 33.00 pack-year smoking history. He has never used smokeless tobacco. He reports that he does not drink alcohol and does not use drugs.  No Known Allergies  Family History  Problem Relation Age of Onset   Aneurysm Mother    Heart attack Father    Colon cancer Maternal Uncle    Cancer Brother    Gastric cancer Neg Hx    Esophageal cancer Neg Hx      Prior to Admission medications   Medication Sig Start Date End Date Taking? Authorizing Provider  alendronate (FOSAMAX) 70 MG tablet Take 70 mg by mouth See admin instructions. Take 70 mg weekly every Sunday 12/25/20  Yes [provider]  amLODipine (NORVASC) 10 MG tablet TAKE ONE TABLET (10MG  TOTAL) BY MOUTH DAILY 01/27/21  Yes Strader, Fransisco Hertz, PA-C  aspirin EC 81 MG tablet Take 1 tablet (81 mg total) by mouth in the morning and at bedtime. Patient taking differently: Take 81 mg by mouth daily. 07/17/20  Yes Kathie Dike, MD  benzonatate (TESSALON) 200 MG capsule Take 200 mg by mouth 3 (three) times daily as needed. 03/19/21  Yes [provider]  Cholecalciferol (VITAMIN D3) 5000 units CAPS Take 5,000 Units by mouth daily.   Yes [provider]  dabrafenib mesylate (TAFINLAR) 50 MG capsule TAKE 2 CAPSULES (100 MG TOTAL) BY MOUTH 2 (TWO) TIMES DAILY. TAKE ON AN EMPTY STOMACH 1 HOUR BEFORE OR 2 HOURS AFTER MEALS. 02/04/21  Yes Derek Jack, MD  escitalopram (LEXAPRO) 10 MG tablet Take 10 mg by mouth daily.  04/03/18  Yes [provider]  glimepiride (AMARYL) 2  MG tablet Take 2 mg by mouth daily with breakfast.    Yes [provider]  insulin lispro (HUMALOG) 100 UNIT/ML injection Inject 2-10 Units into the skin 3 (three) times daily before meals. Per sliding scale   Yes [provider]  insulin lispro (HUMALOG) 100 UNIT/ML KwikPen Inject 2-10 Units into the skin See admin instructions. Inject as per sliding scale: if 200-250=2 units, 251-300=4 units, 301-350=6 units, 351-400=8 units, over 400 give 10 units and notify MD, subcuatneoulsy before meals and at bedtime for dm   Yes [provider]  isosorbide mononitrate (IMDUR) 30 MG 24 hr tablet TAKE ONE TABLET (30MG  TOTAL) BY MOUTH DAILY 01/27/21  Yes Strader, [place] M, PA-C  metFORMIN (GLUCOPHAGE) 500 MG tablet Take 1 tablet by mouth 2 (two) times daily. 08/07/20  Yes [provider]  methocarbamol (ROBAXIN) 500 MG tablet Take 1 tablet (500 mg total) by mouth every 8 (eight) hours as needed for muscle spasms. 07/17/20  Yes Kathie Dike, MD  metoprolol (LOPRESSOR) 100 MG tablet Take 100 mg by mouth 2 (two) times daily.   Yes [provider]  nitroGLYCERIN (NITROSTAT) 0.4 MG SL tablet Place 0.4 mg under the tongue every 5 (five) minutes as needed for chest pain.  06/01/17  Yes [provider]  ondansetron (ZOFRAN ODT) 4 MG disintegrating tablet 4mg  ODT q4 hours prn nausea/vomit 07/23/20  Yes Milton Ferguson, MD  ondansetron (ZOFRAN) 8 MG tablet Take 1 tablet (8 mg total) by mouth every 8 (eight) hours as needed for nausea or vomiting. 09/27/17  Yes Derek Jack, MD  pioglitazone (ACTOS) 45 MG tablet Take 45 mg by mouth daily. 08/14/20  Yes [provider]  polyethylene glycol (MIRALAX / GLYCOLAX) 17 g packet Take 17 g by mouth daily as needed for mild constipation. 07/17/20  Yes Kathie Dike, MD  prochlorperazine (COMPAZINE) 10 MG tablet Take 1 tablet (10 mg total) by mouth every 6 (six) hours as needed for nausea or vomiting. 09/27/17  Yes  Derek Jack, MD  tamsulosin (FLOMAX) 0.4 MG CAPS capsule Take 1 capsule (0.4 mg total) by mouth daily after supper. 07/17/20  Yes Kathie Dike, MD  trametinib dimethyl sulfoxide (MEKINIST) 0.5 MG tablet TAKE 2 TABLETS (1 MG TOTAL) BY MOUTH DAILY. TAKE 1 HOUR BEFORE OR 2 HOURS AFTER A MEAL. STORE REFRIGERATED IN ORIGINAL CONTAINER. 02/04/21  Yes Derek Jack, MD  ONE [facility] ULTRA TEST test strip  04/17/18   [provider]   Physical Exam: Vitals:   03/20/21 1926 03/20/21 1955 03/20/21 2036 03/20/21 2130  BP: (!) 143/75 (!) 144/66 (!) 149/70 130/75  Pulse: (!) 141 (!) 155 (!) 132 (!) 126  Resp: (!) 37 (!) 27 (!) 36 (!) 34  Temp:      TempSrc:      SpO2: 92% 90% 95% 95%  Weight:      Height:        Wt Readings from Last 3 Encounters:  03/20/21 78.5 kg  01/15/21 79.3 kg  09/15/20 82.7 kg     General: Appears critically ill and uncomfortable Eyes: No scleral icterus ENT: Mildly hard of hearing Neck: no masses Cardiovascular: Tachycardic, irregularly irregular, no murmur appreciated but difficult exam due to coarse airway sounds. No LE edema. Telemetry: A. fib with RVR Respiratory: Noticeably increased work of breathing.  Coarse airway sounds throughout, some crackles auscultated in right lower lobes.  Left lower lobes appear clear to auscultation. Abdomen: soft, diffuse mild tenderness to palpation, no rebound or guarding Skin: no rash or induration seen on limited exam Musculoskeletal: grossly normal tone BUE/BLE Psychiatric: grossly normal mood and affect, speech fluent and appropriate Neurologic: grossly non-focal.          Labs on Admission:  Basic Metabolic Panel: Recent Labs  Lab 03/20/21 1757  NA 124*  K 2.8*  CL 89*  CO2 22  GLUCOSE 237*  BUN 15  CREATININE 1.49*  CALCIUM 8.1*   Liver Function Tests: Recent Labs  Lab 03/20/21 1757  AST 32  ALT 14  ALKPHOS 130*  BILITOT 1.1  PROT 8.3*  ALBUMIN 2.6*   No results for input(s):  LIPASE, AMYLASE in the last 168 hours. No results for input(s): AMMONIA in the last 168 hours. CBC: Recent Labs  Lab 03/20/21 1757  WBC 19.5*  NEUTROABS 16.6*  HGB 11.6*  HCT  35.3*  MCV 75.8*  PLT 407*   Cardiac Enzymes: No results for input(s): CKTOTAL, CKMB, CKMBINDEX, TROPONINI in the last 168 hours.  BNP (last 3 results) No results for input(s): BNP in the last 8760 hours.  ProBNP (last 3 results) No results for input(s): PROBNP in the last 8760 hours.  CBG: No results for input(s): GLUCAP in the last 168 hours.  Radiological Exams on Admission: DG Chest Port 1 View  Result Date: 03/20/2021 CLINICAL DATA:  Questionable sepsis. EXAM: PORTABLE CHEST 1 VIEW COMPARISON:  Chest x-ray 12/27/2017. FINDINGS: Patient is status post cardiac surgery. There are atherosclerotic calcifications of the aorta. There are new reticulonodular opacities in both lower lungs, right greater than left. Costophrenic angles are clear. There is no pneumothorax. No acute fractures are seen. IMPRESSION: 1. Reticulonodular opacities in the bilateral lung bases, right greater than left. Findings may be related to increasing pulmonary nodules and/or superimposed infection. Electronically Signed   By: Ronney Asters M.D.   On: 03/20/2021 18:38    EKG: Independently reviewed.  Does not appear to be in sinus rhythm, likely A. fib with RVR, ST elevations evident in leads II, III, aVF and possibly developing in V6.  Reciprocal ST depressions evident in leads I, aVL, V2, V3.  Assessment/Plan Active Problems:   Type 2 diabetes mellitus without complication, without long-term current use of insulin (HCC)   CKD (chronic kidney disease) stage 3, GFR 30-59 ml/min (HCC)   S/P CABG x 4   Adenocarcinoma of lung, stage 4 (HCC)   STEMI (ST elevation myocardial infarction) ([place])   TRISTYN DEMAREST is a 80 y.o. male with hx of metastatic lung cancer currently on palliative immunotherapy, CAD status post four-vessel CABG, CKD,  type 2 diabetes, who presents with chest pain and is found to have a STEMI and severe sepsis likely secondary to pneumonia.  #STEMI #CAD Patient critically ill with STEMI, cardiology previously consulted and did not recommend intervention at this time.  Discussed with patient in frank terms that he is critically ill, and given that we are only doing supportive care gave him the option of transferring down to [facility] versus staying at [facility], patient opts to stay here closer to his family. - Admit to ICU - Aspirin 325 mg, atorvastatin 80 - Hold metoprolol given tenuous status - Continue heparin drip per pharmacy protocol - Telemetry, I's and O's, daily weights - Nitro as needed - TTE in a.m. - Trend troponin - Follow-up with cardiology in a.m.  #Severe sepsis #Multifocal pneumonia #Acute hypoxemic respiratory failure #Lactic acidosis Most likely source of infection based on work-up so far, given ceftriaxone and azithromycin in the ED but will broaden given his immunocompromise state. - Cefepime per pharmacy protocol - Vancomycin per pharmacy protocol - Flagyl 500 mg IV every 12 hours - Follow-up blood and urine cultures - Continue aggressive IV fluid resuscitation - Trend lactate to normal  #Hyponatremia #Hypokalemia Likely secondary to critical illness and malnutrition in addition to malignancy, suspect hyponatremia will improve with fluid resuscitation, will aggressively replete potassium.  #Chronic medical problems Hypertension-hold amlodipine, Imdur, metoprolol  Metastatic lung cancer-hold immunotherapy in setting of critical illness  Depression-continue Lexapro  DM2-n.p.o.-hold glimepiride, metformin, pioglitazone.  Every 4 Accu-Cheks with very sensitive insulin sliding scale.  BPH-continue Flomax   Code Status: DNR/DNI, confirmed with patient multiple times DVT Prophylaxis: on heparin gtt Family Communication: wife updated at bedside Disposition Plan: ICU   Time  spent: 73 min  Clarnce Flock MD/[facility]  Note:  This document was prepared using Dragon voice recognition software and may include unintentional dictation errors.

## 2021-03-20 NOTE — ED Provider Notes (Signed)
[facility] Provider Note   CSN: [id number] Arrival date & time: 03/20/21  1737     History Chief Complaint  Patient presents with   Fever    [name] is a 80 y.o. male.   Fever  This patient is a critically ill-appearing 80 year old male presenting with several problems today including not having anything to eat or drink in 4 days, persistent vomiting, hypoxia down to 84% on room air with tachypnea and a very ill appearance.  He was found to be in atrial fibrillation with rapid ventricular rate.  This patient is known to have stage IV lung cancer, he is not undergoing chemotherapy or radiation secondary to his history of heart disease, he has not had surgery, he states that he does not want to be resuscitated with either intubation, cardioversion or CPR if things were to get worse.  The patient does not know why he is sick but states that over the last 4 days he has had persistent nausea and vomiting with the inability to take any food or fluids.  He has had a gradual progressive weakness and is feeling some shortness of breath, he does not wear oxygen at home.  Symptoms are severe persistent and gradually worsening.  According to the medical record the patient was last seen by the cancer center on September 1 of this year approximately 2 months ago, he is currently getting oral therapy for the metastatic lung cancer, he has been known to have coronary disease status post four-vessel bypass in 2003, he had a spontaneous pneumothorax in 2012 on the right side and has chronic kidney disease as well as diabetes.  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   LIMA to LAD, SVG to D1, SVG to OM2, SVG to RCA, open vein harvest right thigh and lower leg   Spontaneous pneumothorax 09/15/2010   right    Patient Active Problem List    Diagnosis Date Noted   Trochanteric avulsion fracture of femur ([place]) 07/14/2020   Chest pain 12/27/2017   Adenocarcinoma of lung, stage 4 ([place]) 09/08/2017   Abnormal CT of the abdomen 09/05/2017   CKD (chronic kidney disease) stage 3, GFR 30-59 ml/min ([place]) 06/20/2017   Type 2 diabetes mellitus without complication, without long-term current use of insulin ([place]) 11/04/2008   Coronary atherosclerosis 11/04/2008   Angina pectoris ([place]) 11/04/2008   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    Past Surgical History:  Procedure Laterality Date   ANKLE FRACTURE SURGERY  2008   [facility]   APPENDECTOMY  [phone]   CHEST TUBE INSERTION Right 09/15/2010   Dr Servando Snare - spontaneous PTX   CHOLECYSTECTOMY  2008   COLONOSCOPY N/A 09/07/2017   Procedure: COLONOSCOPY;  Surgeon: Daneil Dolin, MD;  Location: [facility] END[facility];  Service: Endoscopy;  Laterality: N/A;  10:30am   CORONARY ARTERY BYPASS GRAFT  2003   EYE SURGERY  2001   Cataracts removed bilaterally   INCISIONAL HERNIA REPAIR N/A 01/29/2015   Procedure: Fatima Blank HERNIORRHAPHY WITH MESH;  Surgeon: Aviva Signs, MD;  Location: [facility] ORS;  Service: General;  Laterality: N/A;   INSERTION OF MESH N/A 01/29/2015   Procedure: INSERTION OF MESH;  Surgeon: Aviva Signs, MD;  Location: [facility] ORS;  Service: General;  Laterality: N/A;  KNEE SURGERY  1993   [facility]   LEFT HEART CATH AND CORS/GRAFTS ANGIOGRAPHY N/A 06/20/2017   Procedure: LEFT HEART CATH AND CORS/GRAFTS ANGIOGRAPHY;  Surgeon: Martinique, Peter M, MD;  Location: [facility];  Service: Cardiovascular;  Laterality: N/A;   ORIF ANKLE FRACTURE  08/26/2011   Procedure: OPEN REDUCTION INTERNAL FIXATION (ORIF) ANKLE FRACTURE;  Surgeon: Sanjuana Kava, MD;  Location: [facility] ORS;  Service: Orthopedics;  Laterality: Right;   POLYPECTOMY  09/07/2017   Procedure: POLYPECTOMY;  Surgeon: Daneil Dolin, MD;  Location: [facility];  Service: Endoscopy;;  ascending x2 (cold snare)    PROSTATE SURGERY  2012   Enlarged prostate   TRANSURETHRAL RESECTION OF PROSTATE  2012       Family History  Problem Relation Age of Onset   Aneurysm Mother    Heart attack Father    Colon cancer Maternal Uncle    Cancer Brother    Gastric cancer Neg Hx    Esophageal cancer Neg Hx     Social History   Tobacco Use   Smoking status: Former    Packs/day: 1.00    Years: 33.00    Pack years: 33.00    Types: Cigarettes    Quit date: 05/17/1987    Years since quitting: 33.8   Smokeless tobacco: Never  Vaping Use   Vaping Use: Never used  Substance Use Topics   Alcohol use: No   Drug use: No    Home Medications Prior to Admission medications   Medication Sig Start Date End Date Taking? Authorizing Provider  alendronate (FOSAMAX) 70 MG tablet Take 70 mg by mouth See admin instructions. Take 70 mg weekly every Sunday 12/25/20  Yes [provider]  amLODipine (NORVASC) 10 MG tablet TAKE ONE TABLET (10MG  TOTAL) BY MOUTH DAILY 01/27/21  Yes Strader, Fransisco Hertz, PA-C  aspirin EC 81 MG tablet Take 1 tablet (81 mg total) by mouth in the morning and at bedtime. Patient taking differently: Take 81 mg by mouth daily. 07/17/20  Yes Kathie Dike, MD  benzonatate (TESSALON) 200 MG capsule Take 200 mg by mouth 3 (three) times daily as needed. 03/19/21  Yes [provider]  Cholecalciferol (VITAMIN D3) 5000 units CAPS Take 5,000 Units by mouth daily.   Yes [provider]  dabrafenib mesylate (TAFINLAR) 50 MG capsule TAKE 2 CAPSULES (100 MG TOTAL) BY MOUTH 2 (TWO) TIMES DAILY. TAKE ON AN EMPTY STOMACH 1 HOUR BEFORE OR 2 HOURS AFTER MEALS. 02/04/21  Yes Derek Jack, MD  escitalopram (LEXAPRO) 10 MG tablet Take 10 mg by mouth daily.  04/03/18  Yes [provider]  glimepiride (AMARYL) 2 MG tablet Take 2 mg by mouth daily with breakfast.    Yes [provider]  insulin lispro (HUMALOG) 100 UNIT/ML injection Inject 2-10 Units into the skin 3  (three) times daily before meals. Per sliding scale   Yes [provider]  insulin lispro (HUMALOG) 100 UNIT/ML KwikPen Inject 2-10 Units into the skin See admin instructions. Inject as per sliding scale: if 200-250=2 units, 251-300=4 units, 301-350=6 units, 351-400=8 units, over 400 give 10 units and notify MD, subcuatneoulsy before meals and at bedtime for dm   Yes [provider]  isosorbide mononitrate (IMDUR) 30 MG 24 hr tablet TAKE ONE TABLET (30MG  TOTAL) BY MOUTH DAILY 01/27/21  Yes Strader, [place] M, PA-C  metFORMIN (GLUCOPHAGE) 500 MG tablet Take 1 tablet by mouth 2 (two) times daily. 08/07/20  Yes [provider]  methocarbamol (ROBAXIN) 500 MG tablet Take 1 tablet (500 mg total) by mouth every 8 (eight) hours as needed for muscle spasms. 07/17/20  Yes Kathie Dike, MD  metoprolol (LOPRESSOR) 100 MG tablet Take 100 mg by mouth 2 (two) times daily.   Yes [provider]  nitroGLYCERIN (NITROSTAT) 0.4 MG SL tablet Place 0.4 mg under the tongue every 5 (five) minutes as needed for chest pain.  06/01/17  Yes [provider]  ondansetron (ZOFRAN ODT) 4 MG disintegrating tablet 4mg  ODT q4 hours prn nausea/vomit 07/23/20  Yes Milton Ferguson, MD  ondansetron (ZOFRAN) 8 MG tablet Take 1 tablet (8 mg total) by mouth every 8 (eight) hours as needed for nausea or vomiting. 09/27/17  Yes Derek Jack, MD  pioglitazone (ACTOS) 45 MG tablet Take 45 mg by mouth daily. 08/14/20  Yes [provider]  polyethylene glycol (MIRALAX / GLYCOLAX) 17 g packet Take 17 g by mouth daily as needed for mild constipation. 07/17/20  Yes Kathie Dike, MD  prochlorperazine (COMPAZINE) 10 MG tablet Take 1 tablet (10 mg total) by mouth every 6 (six) hours as needed for nausea or vomiting. 09/27/17  Yes Derek Jack, MD  tamsulosin (FLOMAX) 0.4 MG CAPS capsule Take 1 capsule (0.4 mg total) by mouth daily after supper. 07/17/20  Yes Kathie Dike, MD  trametinib  dimethyl sulfoxide (MEKINIST) 0.5 MG tablet TAKE 2 TABLETS (1 MG TOTAL) BY MOUTH DAILY. TAKE 1 HOUR BEFORE OR 2 HOURS AFTER A MEAL. STORE REFRIGERATED IN ORIGINAL CONTAINER. 02/04/21  Yes Derek Jack, MD  ONE TOUCH ULTRA TEST test strip  04/17/18   [provider]    Allergies    Patient has no known allergies.  Review of Systems   Review of Systems  Constitutional:  Positive for fever.  All other systems reviewed and are negative.  Physical Exam Updated Vital Signs BP (!) 143/75 (BP Location: Right Arm)   Pulse (!) 141   Temp 98.2 F (36.8 C) (Oral)   Resp (!) 37   Ht 1.803 m (5\' 11" )   Wt 78.5 kg   SpO2 92%   BMI 24.13 kg/m   Physical Exam Vitals and nursing note reviewed.  Constitutional:      General: He is in acute distress.     Appearance: He is well-developed. He is ill-appearing and toxic-appearing.  HENT:     Head: Normocephalic and atraumatic.     Mouth/Throat:     Mouth: Mucous membranes are moist.     Pharynx: No oropharyngeal exudate.  Eyes:     General: No scleral icterus.       Right eye: No discharge.        Left eye: No discharge.     Conjunctiva/sclera: Conjunctivae normal.     Pupils: Pupils are equal, round, and reactive to light.  Neck:     Thyroid: No thyromegaly.     Vascular: No JVD.  Cardiovascular:     Rate and Rhythm: Tachycardia present. Rhythm irregular.     Heart sounds: Normal heart sounds. No murmur heard.   No friction rub. No gallop.  Pulmonary:     Effort: Respiratory distress present.     Breath sounds: Wheezing, rhonchi and rales present.  Abdominal:     General: Bowel sounds are normal. There is no distension.     Palpations: Abdomen is soft. There is no mass.     Tenderness: There is no abdominal tenderness.  Musculoskeletal:        General:  No tenderness. Normal range of motion.     Cervical back: Normal range of motion and neck supple.     Right lower leg: No edema.     Left lower leg: No edema.   Lymphadenopathy:     Cervical: No cervical adenopathy.  Skin:    General: Skin is warm and dry.     Findings: No erythema or rash.  Neurological:     Mental Status: He is alert.     Coordination: Coordination normal.  Psychiatric:        Behavior: Behavior normal.    ED Results / Procedures / Treatments   Labs (all labs ordered are listed, but only abnormal results are displayed) Labs Reviewed  LACTIC ACID, PLASMA - Abnormal; Notable for the following components:      Result Value   Lactic Acid, Venous 2.9 (*)    All other components within normal limits  COMPREHENSIVE METABOLIC PANEL - Abnormal; Notable for the following components:   Sodium 124 (*)    Potassium 2.8 (*)    Chloride 89 (*)    Glucose, Bld 237 (*)    Creatinine, Ser 1.49 (*)    Calcium 8.1 (*)    Total Protein 8.3 (*)    Albumin 2.6 (*)    Alkaline Phosphatase 130 (*)    GFR, Estimated 47 (*)    All other components within normal limits  CBC WITH DIFFERENTIAL/PLATELET - Abnormal; Notable for the following components:   WBC 19.5 (*)    Hemoglobin 11.6 (*)    HCT 35.3 (*)    MCV 75.8 (*)    MCH 24.9 (*)    Platelets 407 (*)    Neutro Abs 16.6 (*)    Abs Immature Granulocytes 0.26 (*)    All other components within normal limits  PROTIME-INR - Abnormal; Notable for the following components:   Prothrombin Time 16.6 (*)    INR 1.3 (*)    All other components within normal limits  APTT - Abnormal; Notable for the following components:   aPTT 39 (*)    All other components within normal limits  TROPONIN I (HIGH SENSITIVITY) - Abnormal; Notable for the following components:   Troponin I (High Sensitivity) 3,329 (*)    All other components within normal limits  RESP PANEL BY RT-PCR (FLU A&B, COVID) ARPGX2  CULTURE, BLOOD (ROUTINE X 2)  CULTURE, BLOOD (ROUTINE X 2)  URINE CULTURE  LACTIC ACID, PLASMA  URINALYSIS, ROUTINE W REFLEX MICROSCOPIC  TROPONIN I (HIGH SENSITIVITY)    EKG EKG  Interpretation  Date/Time:  Friday March 20 2021 17:42:47 EDT Ventricular Rate:  167 PR Interval:  92 QRS Duration: 96 QT Interval:  328 QTC Calculation: 547 R Axis:   69 Text Interpretation: Atrial fibrillation with rapid ventricular response Inferoposterior infarct, acute (RCA) Probable RV involvement, suggest recording right precordial leads Artifact in lead(s) I II III aVR aVL aVF V1 V2 V3 V4 V5 V6 Abnormal ST depression in anterior leads Confirmed by Noemi Chapel [id number]) on 03/20/2021 6:08:30 PM   EKG Interpretation  Date/Time:  Friday March 20 2021 18:30:21 EDT Ventricular Rate:  169 PR Interval:  92 QRS Duration: 136 QT Interval:  318 QTC Calculation: 534 R Axis:   79 Text Interpretation: Wide-QRS tachycardia Nonspecific intraventricular conduction delay STEMI now more apparent Confirmed by Noemi Chapel [phone]) on 03/20/2021 6:57:04 PM         Radiology DG Chest Port 1 View  Result Date: 03/20/2021 CLINICAL DATA:  Questionable  sepsis. EXAM: PORTABLE CHEST 1 VIEW COMPARISON:  Chest x-ray 12/27/2017. FINDINGS: Patient is status post cardiac surgery. There are atherosclerotic calcifications of the aorta. There are new reticulonodular opacities in both lower lungs, right greater than left. Costophrenic angles are clear. There is no pneumothorax. No acute fractures are seen. IMPRESSION: 1. Reticulonodular opacities in the bilateral lung bases, right greater than left. Findings may be related to increasing pulmonary nodules and/or superimposed infection. Electronically Signed   By: Ronney Asters M.D.   On: 03/20/2021 18:38    Procedures .Critical Care Performed by: Noemi Chapel, MD Authorized by: Noemi Chapel, MD   Critical care provider statement:    Critical care time (minutes):  75   Critical care time was exclusive of:  Separately billable procedures and treating other patients   Critical care was necessary to treat or prevent imminent or life-threatening  deterioration of the following conditions:  Cardiac failure and respiratory failure   Critical care was time spent personally by me on the following activities:  Development of treatment plan with patient or surrogate, discussions with consultants, evaluation of patient's response to treatment, examination of patient, obtaining history from patient or surrogate, review of old charts, re-evaluation of patient's condition, pulse oximetry, ordering and review of radiographic studies, ordering and review of laboratory studies and ordering and performing treatments and interventions   Care discussed with: admitting provider     Medications Ordered in ED Medications  lactated ringers infusion (has no administration in time range)  sodium chloride 0.9 % bolus 1,000 mL (1,000 mLs Intravenous New Bag/Given 03/20/21 1809)    And  sodium chloride 0.9 % bolus 1,000 mL (1,000 mLs Intravenous New Bag/Given 03/20/21 1813)    And  sodium chloride 0.9 % bolus 500 mL (has no administration in time range)  cefTRIAXone (ROCEPHIN) 2 g in sodium chloride 0.9 % 100 mL IVPB (0 g Intravenous Stopped 03/20/21 1846)  azithromycin (ZITHROMAX) 500 mg in sodium chloride 0.9 % 250 mL IVPB (0 mg Intravenous Stopped 03/20/21 1926)  magnesium sulfate IVPB 2 g 50 mL (has no administration in time range)  potassium chloride 10 mEq in 100 mL IVPB (has no administration in time range)  ondansetron (ZOFRAN) injection 4 mg (4 mg Intravenous Given 03/20/21 1817)    ED Course  I have reviewed the triage vital signs and the nursing notes.  Pertinent labs & imaging results that were available during my care of the patient were reviewed by me and considered in my medical decision making (see chart for details).  Clinical Course as of 03/20/21 1928  [name] Mar 20, 2021  1822 I have personally viewed the chest x-ray which appears to have lower lobe infiltrates right greater than left consistent with community-acquired pneumonia [BM]  1823  White blood cell count is over 19,000 with a leftward shift suggestive of a more pathological bacterial process [BM]  1838 I was given an EKG at 6:35 PM, performed as the patient had increasing chest pain, this does not fact show that he has some ST elevation in his inferior leads raising further concern for possible ischemia.  We will recontact with cardiology at this time, requesting STEMI doc consultation [BM]    Clinical Course User Index [BM] Noemi Chapel, MD   MDM Rules/Calculators/A&P                           The patient is hypoxic and tachycardic in atrial fibrillation, I suspect that  the atrial fibrillation is likely secondary to the underlying process however his EKG is very abnormal showing ST depression in the anterior leads that could be consistent with posterior MI.  The patient will need multiple consultations, I will discussed with cardiology immediately in case they want to activate a STEMI for this however I suspect this is reactive secondary to the patient's underlying disease.  This may be related to a gastrointestinal process, he may have sepsis, he has low oxygenation and may in fact have pneumonia  I have reviewed the patient's medications, he is taking metoprolol, glimepiride, Lexapro, insulin, Humalog, metformin, Actos, amlodipine, Imdur as well as his oral medication for cancer.  This patient is critically  IV established, 30 cc/kg of IV fluids has been ordered equaling approximately 2.5 L and antibiotics ordered for presumed community-acquired pneumonia.  D/w Dr. Harrell Gave - who states to not activate STEMI -and no need for emergent transfer to [facility] for heart catheterization, further investigation and stabilizing care needed at this time.  Discussed with Dr. Gwenlyn Found, STEMI cardiologist at 6:45 PM, he is in agreement that this patient is critically ill with multiple things going on and that heart catheterization at this time would not be appropriate, he needs  stabilizing care for what appears his underlying sepsis with leukocytosis likely pneumonia hypoxic respiratory failure etc.  He does agree with moving the patient to [facility] on to the hospital service  Sepsis: This patient is being treated for sepsis with antibiotics to cover for community-acquired pneumonia which is the only source that I see at this time and given the patient's hypoxia and tachypnea I suspect that that correlates with his chest x-ray findings.  Atrial fibrillation with rapid ventricular rate, this being controlled with IV fluids which is actually improving his symptoms, his rate is slowing down slightly, he is under significant stress due to infection as well as cardiac ischemia given his troponin of 3000, avoiding beta-blocker therapy at this time  Cardiac ischemia, abnormal EKG discussed with cardiology, heparin will be started as the patient has a troponin of 3000 and is in atrial fibrillation.  Will discuss with hospitalist for admission and transfer to cardiac center, the patient is DO NOT RESUSCITATE based on my discussion with him, he does not wish to be intubated nor does he wish to have CPR or electrical cardioversion.  He does agree to procedures that may need stabilizing care as well as antibiotics and fluids.  He is getting all of that at this time including 30 cc/kg of IV fluids  Hyponatremia, also likely relative to the patient's poor nutrition over the last week.  Final Clinical Impression(s) / ED Diagnoses Final diagnoses:  NSTEMI (non-ST elevated myocardial infarction) [facility])  Atrial fibrillation with rapid ventricular response ([place])  Sepsis, due to unspecified organism, unspecified whether acute organ dysfunction present ([place])  Hyponatremia     Noemi Chapel, MD 03/20/21 1942

## 2021-03-20 NOTE — Progress Notes (Signed)
Pharmacy Antibiotic Note  [name] is a 80 y.o. male admitted on 03/20/2021 with concern for sepsis. Pharmacy has been consulted for Vancomycin + Cefepime dosing.  Hx CKD - baseline appears to be 1.4-1.7, current SCr 1.49, CrCl~40 ml/min.   The patient received a dose of Rocephin + Azithromycin in the [facility].   Plan: - Vancomycin 1500 mg IV x 1 followed by 1g IV every 24 hours (eAUC 456, SCr 1.49, Vd 0.72) - Cefepime 2g IV every 12 hours - Will continue to follow renal function, culture results, LOT, and antibiotic de-escalation plans   Height: 5\' 11"  (180.3 cm) Weight: 78.5 kg (173 lb) IBW/kg (Calculated) : 75.3  Temp (24hrs), Avg:98.2 F (36.8 C), Min:98.2 F (36.8 C), Max:98.2 F (36.8 C)  Recent Labs  Lab 03/20/21 1756 03/20/21 1757 03/20/21 2004  WBC  --  19.5*  --   CREATININE  --  1.49*  --   LATICACIDVEN 2.9*  --  2.1*    Estimated Creatinine Clearance: 41.4 mL/min (A) (by C-G formula based on SCr of 1.49 mg/dL (H)).    No Known Allergies  Antimicrobials this admission: CTX 11/4 x 1 Azithro 11/4 x 1 Vancomycin 11/4 >> Cefepime 11/4 >>  Dose adjustments this admission: N/a  Microbiology results: 11/4 UCx >> 11/4 BCx >> 11/4 Influenza/COVID >> neg  Thank you for allowing pharmacy to be a part of this patient's care.  Alycia Rossetti, PharmD, BCPS Clinical Pharmacist 03/20/2021 10:14 PM   **Pharmacist phone directory can now be found on [email or web address] (PW [email or web address]).  Listed under [place].

## 2021-03-20 NOTE — ED Notes (Addendum)
Date and time results received: 03/20/21 10:54 PM  Test: Troponin Critical Value: 3,762  Name of Provider Notified: Dr. Josephine Cables

## 2021-03-20 NOTE — ED Notes (Signed)
Called [facility]ology not in at [facility]

## 2021-03-20 NOTE — ED Notes (Signed)
Patient states that he is having chest pain.  Captured 12-lead and given to provider. Lab called with critical value: 2.9.  Provider made aware.  Code cart pulled to bedside and cardiac pads placed on patient.

## 2021-03-20 NOTE — Progress Notes (Signed)
ANTICOAGULATION CONSULT NOTE - Initial Consult  Pharmacy Consult for Heparin Indication: chest pain/ACS and atrial fibrillation  No Known Allergies  Patient Measurements: Height: 5\' 11"  (180.3 cm) Weight: 78.5 kg (173 lb) IBW/kg (Calculated) : 75.3 Heparin Dosing Weight: 78.5 kg  Vital Signs: Temp: 98.2 F (36.8 C) (11/04 1749) Temp Source: Oral (11/04 1749) BP: 143/75 (11/04 1926) Pulse Rate: 141 (11/04 1926)  Labs: Recent Labs    03/20/21 1757  HGB 11.6*  HCT 35.3*  PLT 407*  APTT 39*  LABPROT 16.6*  INR 1.3*  CREATININE 1.49*  TROPONINIHS 3,329*    Estimated Creatinine Clearance: 41.4 mL/min (A) (by C-G formula based on SCr of 1.49 mg/dL (H)).   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 Noted +trops. Pharmacy consulted to start Heparin for anticoagulation  The patient was not on anticoagulation PTA. Hgb 11.6, plts 407.   Goal of Therapy:  Heparin level 0.3-0.7 units/ml Monitor platelets by anticoagulation protocol: Yes   Plan:  - Heparin 4000 unit bolus x 1 - Start Heparin at a rate of 1100 units/hr (11 ml/hr) - Will continue to monitor for any signs/symptoms of bleeding and will follow up with heparin level in 8 hours   Thank you for allowing pharmacy to be a part of this patient's care.  Alycia Rossetti, PharmD,  BCPS Clinical Pharmacist 03/20/2021 7:35 PM   **Pharmacist phone directory can now be found on [email or web address] ([email or web address]).  Listed under [place].

## 2021-03-21 ENCOUNTER — Encounter (HOSPITAL_COMMUNITY): Payer: Self-pay | Admitting: Family Medicine

## 2021-03-21 ENCOUNTER — Inpatient Hospital Stay (HOSPITAL_COMMUNITY): Payer: Medicare Other

## 2021-03-21 DIAGNOSIS — N1832 Chronic kidney disease, stage 3b: Secondary | ICD-10-CM

## 2021-03-21 DIAGNOSIS — C3490 Malignant neoplasm of unspecified part of unspecified bronchus or lung: Secondary | ICD-10-CM | POA: Diagnosis not present

## 2021-03-21 DIAGNOSIS — E871 Hypo-osmolality and hyponatremia: Secondary | ICD-10-CM

## 2021-03-21 DIAGNOSIS — I213 ST elevation (STEMI) myocardial infarction of unspecified site: Secondary | ICD-10-CM

## 2021-03-21 DIAGNOSIS — A419 Sepsis, unspecified organism: Secondary | ICD-10-CM | POA: Diagnosis not present

## 2021-03-21 DIAGNOSIS — R079 Chest pain, unspecified: Secondary | ICD-10-CM

## 2021-03-21 LAB — BASIC METABOLIC PANEL
Anion gap: 7 (ref 5–15)
BUN: 13 mg/dL (ref 8–23)
CO2: 19 mmol/L — ABNORMAL LOW (ref 22–32)
Calcium: 7 mg/dL — ABNORMAL LOW (ref 8.9–10.3)
Chloride: 102 mmol/L (ref 98–111)
Creatinine, Ser: 1.25 mg/dL — ABNORMAL HIGH (ref 0.61–1.24)
GFR, Estimated: 58 mL/min — ABNORMAL LOW (ref >60)
Glucose, Bld: 143 mg/dL — ABNORMAL HIGH (ref 70–99)
Potassium: 3.3 mmol/L — ABNORMAL LOW (ref 3.5–5.1)
Sodium: 128 mmol/L — ABNORMAL LOW (ref 135–145)

## 2021-03-21 LAB — BLOOD CULTURE ID PANEL (REFLEXED) - BCID2

## 2021-03-21 LAB — ECHOCARDIOGRAM COMPLETE
Calc EF: 34.2 %
Height: 71 in
MV M vel: 5.37 m/s
MV Peak grad: 115.3 mmHg
S' Lateral: 3.8 cm
Single Plane A2C EF: 30.7 %
Single Plane A4C EF: 36.3 %
Weight: 2712.54 oz

## 2021-03-21 LAB — CBC
HCT: 26.5 % — ABNORMAL LOW (ref 39.0–52.0)
Hemoglobin: 8.9 g/dL — ABNORMAL LOW (ref 13.0–17.0)
MCH: 25.4 pg — ABNORMAL LOW (ref 26.0–34.0)
MCHC: 33.6 g/dL (ref 30.0–36.0)
MCV: 75.7 fL — ABNORMAL LOW (ref 80.0–100.0)
Platelets: 278 10*3/uL (ref 150–400)
RBC: 3.5 MIL/uL — ABNORMAL LOW (ref 4.22–5.81)
RDW: 15.1 % (ref 11.5–15.5)
WBC: 16 10*3/uL — ABNORMAL HIGH (ref 4.0–10.5)
nRBC: 0 % (ref 0.0–0.2)

## 2021-03-21 LAB — GLUCOSE, CAPILLARY
Glucose-Capillary: 125 mg/dL — ABNORMAL HIGH (ref 70–99)
Glucose-Capillary: 155 mg/dL — ABNORMAL HIGH (ref 70–99)
Glucose-Capillary: 174 mg/dL — ABNORMAL HIGH (ref 70–99)
Glucose-Capillary: 197 mg/dL — ABNORMAL HIGH (ref 70–99)
Glucose-Capillary: 218 mg/dL — ABNORMAL HIGH (ref 70–99)
Glucose-Capillary: 251 mg/dL — ABNORMAL HIGH (ref 70–99)

## 2021-03-21 LAB — TROPONIN I (HIGH SENSITIVITY)
Troponin I (High Sensitivity): 4413 ng/L (ref <18)
Troponin I (High Sensitivity): 6544 ng/L (ref <18)
Troponin I (High Sensitivity): 8310 ng/L (ref <18)

## 2021-03-21 LAB — HEPARIN LEVEL (UNFRACTIONATED)
Heparin Unfractionated: <0.1 IU/mL — ABNORMAL LOW (ref 0.30–0.70)
Heparin Unfractionated: 0.19 IU/mL — ABNORMAL LOW (ref 0.30–0.70)

## 2021-03-21 LAB — LACTIC ACID, PLASMA: Lactic Acid, Venous: 1.2 mmol/L (ref 0.5–1.9)

## 2021-03-21 LAB — PROCALCITONIN: Procalcitonin: 1.08 ng/mL

## 2021-03-21 LAB — MRSA NEXT GEN BY PCR, NASAL: MRSA by PCR Next Gen: NOT DETECTED

## 2021-03-21 MED ORDER — METOPROLOL TARTRATE 5 MG/5ML IV SOLN
2.5000 mg | Freq: Once | INTRAVENOUS | Status: AC
  Administered 2021-03-21: 2.5 mg via INTRAVENOUS
  Filled 2021-03-21: qty 5

## 2021-03-21 MED ORDER — POTASSIUM CHLORIDE IN NACL 20-0.9 MEQ/L-% IV SOLN: INTRAVENOUS | Status: DC

## 2021-03-21 MED ORDER — LEVALBUTEROL HCL 0.63 MG/3ML IN NEBU
0.6300 mg | INHALATION_SOLUTION | Freq: Four times a day (QID) | RESPIRATORY_TRACT | Status: DC
  Administered 2021-03-21 (×3): 0.63 mg via RESPIRATORY_TRACT
  Filled 2021-03-21 (×3): qty 3

## 2021-03-21 MED ORDER — HEPARIN BOLUS VIA INFUSION
2000.0000 [IU] | Freq: Once | INTRAVENOUS | Status: AC
  Administered 2021-03-21: 2000 [IU] via INTRAVENOUS
  Filled 2021-03-21: qty 2000

## 2021-03-21 MED ORDER — CHLORHEXIDINE GLUCONATE CLOTH 2 % EX PADS
6.0000 | MEDICATED_PAD | Freq: Every day | CUTANEOUS | Status: DC
  Administered 2021-03-21 – 2021-03-30 (×9): 6 via TOPICAL

## 2021-03-21 MED ORDER — IPRATROPIUM BROMIDE 0.02 % IN SOLN
0.5000 mg | Freq: Four times a day (QID) | RESPIRATORY_TRACT | Status: DC
  Administered 2021-03-21 (×3): 0.5 mg via RESPIRATORY_TRACT
  Filled 2021-03-21 (×3): qty 2.5

## 2021-03-21 MED ORDER — METOPROLOL TARTRATE 25 MG PO TABS
12.5000 mg | ORAL_TABLET | Freq: Two times a day (BID) | ORAL | Status: DC
  Administered 2021-03-21 – 2021-03-24 (×8): 12.5 mg via ORAL
  Filled 2021-03-21 (×8): qty 1

## 2021-03-21 MED ORDER — BUDESONIDE 0.5 MG/2ML IN SUSP
0.5000 mg | Freq: Two times a day (BID) | RESPIRATORY_TRACT | Status: DC
  Administered 2021-03-21 – 2021-03-30 (×19): 0.5 mg via RESPIRATORY_TRACT
  Filled 2021-03-21 (×21): qty 2

## 2021-03-21 MED ORDER — NITROGLYCERIN IN D5W 200-5 MCG/ML-% IV SOLN
5.0000 ug/min | INTRAVENOUS | Status: DC
  Administered 2021-03-21: 5 ug/min via INTRAVENOUS
  Filled 2021-03-21: qty 250

## 2021-03-21 NOTE — Progress Notes (Addendum)
RN called due to patient's HR ranging within 120s to 140s.  Patient was in A-Fib with  RVR on admission. Repeated EKG showed A-Fib with RVR at a rate of 136.  Patient was asymptomatic and was already on heparin drip.  Metoprolol was held on admission.  Cardiology fellow at [facility] was consulted and agreed that it was okay to give Lopressor.  IV Lopressor 2.5 mg x 1 was given. We shall continue to monitor patient and treat accordingly.

## 2021-03-21 NOTE — Progress Notes (Addendum)
PROGRESS NOTE  [name] BPZ:[id number] DOB: 1941-03-08 DOA: 03/20/2021 PCP: [name], MD  Brief History:  80 year old male with a history of stage IV lung adenocarcinoma, CKD stage III, hypertension, diabetes mellitus type 2, coronary artery disease status post CABG presenting with 2-week history of chest pressure and shortness of breath.  He states that the chest pressure was initially intermittent, but has become constant with worsening shortness of breath.  He has had increasing generalized weakness and decreased oral intake.  He has had some subjective fevers and chills.  He denies any headache, neck pain, mopped assist but complains of of a nonproductive cough. In the emergency department, the patient was afebrile and tachycardic.  Oxygen saturation was in the low 90s on 4 L nasal cannula.  Heart rate ranging from the 130s to 150s, and respirations ranging from the 20s to low 40s.  CMP notable for hyponatremia to 124, potassium 2.8, chloride 89, glucose 237, creatinine 1.49, calcium 8.1; CBC notable for white count of 19.5, remainder unremarkable.  Initial troponin was 3329, initial lactic acid was 2.9 and down trended to 2.1 on recheck.  COVID and influenza swabs were negative.  Chest x-ray was concerning for possible bilateral lower lobe opacities possibly representing infection.  Initial EKG showed some ST depressions but no clear STEMI, on repeat newly evident STEMI was apparent in leads II, III, and aVF, with reciprocal changes seen in the anterior leads. Patient was started on broad-spectrum antibiotics, heparin drip, and IV fluids.  ED provider consulted with cardiology, who agreed with STEMI diagnosis but did not recommend Cath Lab activation at this time given critical illness, however did recommend transfer to [facility] for further treatment and stabilization.  However, at lease 2 separate providers including myself have discussed with the patient regarding risk/benefits of  transfer to [facility], and the pt continues to confirm that he wishes to stay at [facility].    Assessment/Plan: Severe sepsis -Present on admission -Presented with tachycardia, hypoxia, leukocytosis, elevated lactic acid -Lactic acid peaked 2.9>> 1.2 -Continue cefepime and vancomycin pending culture data -Check PCT  STEMI -Personally reviewed EKG 11/4--ST elevation in lateral leads -Continue IV heparin -Start IV nitroglycerin -Continue statin -Limited echo -Continue aspirin -01/09/2021 echo EF 55 to 60%, no WMA, normal RV, mild MR  Lobar pneumonia -Check PCT -Continue cefepime -MRSA negative  Acute respiratory failure with hypoxia -Stable on 3 L -Wean oxygen for saturation greater 92%  Paroxysmal Afib -continue IV heparin -had short episode 11/4 evening and at time of admission -now back in sinus -low dose metoprolol  CKD stage IIIb -Baseline creatinine 1.2-1.5 -Monitor serially  Metastatic lung adenocarcinoma -Patient has been on dabrafenib and trametinib -Holding immunotherapy in the setting of sepsis -Outpatient follow-up with Dr. Delton Coombes  Diabetes mellitus type 2 -Check hemoglobin A1c -NovoLog sliding scale -Holding Amaryl and metformin -Holding Actos  Essential hypertension -Holding amlodipine -restart low dose metoprolol  Hyponatremia -Secondary to volume depletion -Judicious IV fluids  Goals of care -Patient confirms DNR/DNI -Advance care planning, including the explanation and discussion of advance directives was carried out with the patient and family.  Code status including explanations of "Full Code" and "DNR" and alternatives were discussed in detail.  Discussion of end-of-life issues including but not limited palliative care, hospice care and the concept of hospice, other end-of-life care options, power of attorney for health care decisions, living wills, and physician orders for life-sustaining treatment were also discussed with  the  patient and family.  Total face to face time 16 minutes.        Status is: Inpatient  Remains inpatient appropriate because: severity of illness requiring IV heparin and IV abx        Family Communication:   Family at bedside  Consultants:  none  Code Status:   DNR  DVT Prophylaxis:  IV Heparin    Procedures: As Listed in Progress Note Above  Antibiotics: Cefepime 114>> Vanc 11/4>>   The patient is critically ill with multiple organ systems failure and requires high complexity decision making for assessment and support, frequent evaluation and titration of therapies, application of advanced monitoring technologies and extensive interpretation of multiple databases.  Critical care time - 35 mins.     Subjective: Patient complains of chest pressure.  Denies f/c, n/v/d, abd pain, headache.  Objective: Vitals:   03/21/21 0600 03/21/21 0700 03/21/21 0732 03/21/21 0800  BP: 129/80 122/71  122/68  Pulse: 98 87 96 (!) 30  Resp: (!) 28 (!) 25 (!) 26 (!) 28  Temp:   97.6 F (36.4 C)   TempSrc:   Oral   SpO2: 99% 98% 100% 99%  Weight:      Height:        Intake/Output Summary (Last 24 hours) at 03/21/2021 0853 Last data filed at 03/21/2021 0040 Gross per 24 hour  Intake 4112.93 ml  Output --  Net 4112.93 ml   Weight change:  Exam:  General:  Pt is alert, follows commands appropriately, not in acute distress HEENT: No icterus, No thrush, No neck mass, [place]/AT Cardiovascular: RRR, S1/S2, no rubs, no gallops Respiratory: bilateral rales.  Bibasilar wheeze Abdomen: Soft/+BS, non tender, non distended, no guarding Extremities: trace LE edema, No lymphangitis, No petechiae, No rashes, no synovitis   Data Reviewed: I have personally reviewed following labs and imaging studies Basic Metabolic Panel: Recent Labs  Lab 03/20/21 1757 03/20/21 2210 03/21/21 0418  NA 124* 127* 128*  K 2.8* 3.3* 3.3*  CL 89* 98 102  CO2 22 17* 19*  GLUCOSE 237* 218* 143*  BUN  15 14 13   CREATININE 1.49* 1.34* 1.25*  CALCIUM 8.1* 7.1* 7.0*   Liver Function Tests: Recent Labs  Lab 03/20/21 1757 03/20/21 2210  AST 32 27  ALT 14 12  ALKPHOS 130* 100  BILITOT 1.1 1.1  PROT 8.3* 6.4*  ALBUMIN 2.6* 2.0*   No results for input(s): LIPASE, AMYLASE in the last 168 hours. No results for input(s): AMMONIA in the last 168 hours. Coagulation Profile: Recent Labs  Lab 03/20/21 1757  INR 1.3*   CBC: Recent Labs  Lab 03/20/21 1757 03/21/21 0418  WBC 19.5* 16.0*  NEUTROABS 16.6*  --   HGB 11.6* 8.9*  HCT 35.3* 26.5*  MCV 75.8* 75.7*  PLT 407* 278   Cardiac Enzymes: No results for input(s): CKTOTAL, CKMB, CKMBINDEX, TROPONINI in the last 168 hours. BNP: Invalid input(s): POCBNP CBG: Recent Labs  Lab 03/21/21 0008 03/21/21 0423 03/21/21 0731  GLUCAP 218* 125* 155*   HbA1C: No results for input(s): HGBA1C in the last 72 hours. Urine analysis:    Component Value Date/Time   COLORURINE YELLOW 07/23/2020 2120   APPEARANCEUR HAZY (A) 07/23/2020 2120   LABSPEC 1.017 07/23/2020 2120   PHURINE 6.0 07/23/2020 2120   GLUCOSEU >=500 (A) 07/23/2020 2120   HGBUR LARGE (A) 07/23/2020 2120   BILIRUBINUR NEGATIVE 07/23/2020 2120   KETONESUR 20 (A) 07/23/2020 2120   PROTEINUR 100 (A) 07/23/2020 2120  UROBILINOGEN 0.2 05/05/2007 1658   NITRITE NEGATIVE 07/23/2020 2120   LEUKOCYTESUR LARGE (A) 07/23/2020 2120   Sepsis Labs: @LABRCNTIP (procalcitonin:4,lacticidven:4) ) Recent Results (from the past 240 hour(s))  Blood Culture (routine x 2)     Status: None (Preliminary result)   Collection Time: 03/20/21  5:57 PM   Specimen: Right Antecubital; Blood  Result Value Ref Range Status   Specimen Description RIGHT ANTECUBITAL  Final   Special Requests   Final    BOTTLES DRAWN AEROBIC AND ANAEROBIC Blood Culture results may not be optimal due to an inadequate volume of blood received in culture bottles Performed at [facility], [address],  [place], [place] [address]    Culture PENDING  Incomplete   Report Status PENDING  Incomplete  Resp Panel by RT-PCR (Flu A&B, Covid) Nasopharyngeal Swab     Status: None   Collection Time: 03/20/21  6:25 PM   Specimen: Nasopharyngeal Swab; Nasopharyngeal(NP) swabs in vial transport medium  Result Value Ref Range Status   SARS Coronavirus 2 by RT PCR NEGATIVE NEGATIVE Final    Comment: (NOTE) SARS-CoV-2 target nucleic acids are NOT DETECTED.  The SARS-CoV-2 RNA is generally detectable in upper respiratory specimens during the acute phase of infection. The lowest concentration of SARS-CoV-2 viral copies this assay can detect is 138 copies/mL. A negative result does not preclude SARS-Cov-2 infection and should not be used as the sole basis for treatment or other patient management decisions. A negative result may occur with  improper specimen collection/handling, submission of specimen other than nasopharyngeal swab, presence of viral mutation(s) within the areas targeted by this assay, and inadequate number of viral copies(<138 copies/mL). A negative result must be combined with clinical observations, patient history, and epidemiological information. The expected result is Negative.  Fact Sheet for Patients:  EntrepreneurPulse.com.au  Fact Sheet for Healthcare Providers:  IncredibleEmployment.be  This test is no t yet approved or cleared by the [place] FDA and  has been authorized for detection and/or diagnosis of SARS-CoV-2 by FDA under an Emergency Use Authorization (EUA). This EUA will remain  in effect (meaning this test can be used) for the duration of the COVID-19 declaration under Section 564(b)(1) of the Act, 21 U.S.C.section 360bbb-3(b)(1), unless the authorization is terminated  or revoked sooner.       Influenza A by PCR NEGATIVE NEGATIVE Final   Influenza B by PCR NEGATIVE NEGATIVE Final    Comment: (NOTE) The Xpert Xpress  SARS-CoV-2/FLU/RSV plus assay is intended as an aid in the diagnosis of influenza from Nasopharyngeal swab specimens and should not be used as a sole basis for treatment. Nasal washings and aspirates are unacceptable for Xpert Xpress SARS-CoV-2/FLU/RSV testing.  Fact Sheet for Patients: EntrepreneurPulse.com.au  Fact Sheet for Healthcare Providers: IncredibleEmployment.be  This test is not yet approved or cleared by the [place] FDA and has been authorized for detection and/or diagnosis of SARS-CoV-2 by FDA under an Emergency Use Authorization (EUA). This EUA will remain in effect (meaning this test can be used) for the duration of the COVID-19 declaration under Section 564(b)(1) of the Act, 21 U.S.C. section 360bbb-3(b)(1), unless the authorization is terminated or revoked.  Performed at [facility], [address], [place], [place] [address]   MRSA Next Gen by PCR, Nasal     Status: None   Collection Time: 03/20/21 11:37 PM   Specimen: Nasal Mucosa; Nasal Swab  Result Value Ref Range Status   MRSA by PCR Next Gen NOT DETECTED NOT DETECTED Final  Comment: (NOTE) The GeneXpert MRSA Assay (FDA approved for NASAL specimens only), is one component of a comprehensive MRSA colonization surveillance program. It is not intended to diagnose MRSA infection nor to guide or monitor treatment for MRSA infections. Test performance is not FDA approved in patients less than 76 years old. Performed at [facility], [address], [place], [place] [address]      Scheduled Meds:  aspirin EC  81 mg Oral Daily   atorvastatin  80 mg Oral Daily   Chlorhexidine Gluconate Cloth  6 each Topical Daily   escitalopram  10 mg Oral Daily   insulin aspart  0-6 Units Subcutaneous Q4H   tamsulosin  0.4 mg Oral QPC supper   Continuous Infusions:  ceFEPime (MAXIPIME) IV Stopped (03/21/21 0034)   heparin 1,300 Units/hr (03/21/21 0617)   lactated ringers 150 mL/hr  at 03/21/21 0040   metronidazole Stopped (03/20/21 2349)   vancomycin      Procedures/Studies: DG Chest Port 1 View  Result Date: 03/20/2021 CLINICAL DATA:  Questionable sepsis. EXAM: PORTABLE CHEST 1 VIEW COMPARISON:  Chest x-ray 12/27/2017. FINDINGS: Patient is status post cardiac surgery. There are atherosclerotic calcifications of the aorta. There are new reticulonodular opacities in both lower lungs, right greater than left. Costophrenic angles are clear. There is no pneumothorax. No acute fractures are seen. IMPRESSION: 1. Reticulonodular opacities in the bilateral lung bases, right greater than left. Findings may be related to increasing pulmonary nodules and/or superimposed infection. Electronically Signed   By: Ronney Asters M.D.   On: 03/20/2021 18:38    Orson Eva, DO  [facility]  If 7PM-7AM, please contact night-coverage [email or web address] Password [email or web address] 03/21/2021, 8:53 AM   LOS: 1 day

## 2021-03-21 NOTE — Progress Notes (Signed)
  Echocardiogram 2D Echocardiogram has been performed.  [name] 03/21/2021, 1:28 PM

## 2021-03-21 NOTE — Progress Notes (Signed)
Date and time results received: 03/21/21 1630 (use smartphrase ".now" to insert current time)  Test: Blood culture Critical Value: Anaerobic bottle; gram positive cocci  Name of Provider Notified: MD Tat  Orders Received? Or Actions Taken?:  No new orders at this time

## 2021-03-21 NOTE — Progress Notes (Signed)
ANTICOAGULATION CONSULT NOTE   Pharmacy Consult for Heparin Indication: chest pain/ACS and atrial fibrillation  Allergies  Allergen Reactions   Oxycodone     Patient Measurements: Height: 5\' 11"  (180.3 cm) Weight: 76.9 kg (169 lb 8.5 oz) IBW/kg (Calculated) : 75.3 Heparin Dosing Weight: 78.5 kg  Vital Signs: BP: 132/57 (11/05 0141) Pulse Rate: 99 (11/05 0141)  Labs: Recent Labs    03/20/21 1757 03/20/21 2004 03/20/21 2210 03/21/21 0017 03/21/21 0236 03/21/21 0418  HGB 11.6*  --   --   --   --  8.9*  HCT 35.3*  --   --   --   --  26.5*  PLT 407*  --   --   --   --  278  APTT 39*  --   --   --   --   --   LABPROT 16.6*  --   --   --   --   --   INR 1.3*  --   --   --   --   --   HEPARINUNFRC  --   --   --   --   --  <0.10*  CREATININE 1.49*  --  1.34*  --   --  1.25*  TROPONINIHS 3,329*   < > 3,762* 4,413* 6,544* 8,310*   < > = values in this interval not displayed.     Estimated Creatinine Clearance: 49.4 mL/min (A) (by C-G formula based on SCr of 1.25 mg/dL (H)).   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 Noted +trops. Pharmacy consulted to start Heparin for anticoagulation  The patient was not on anticoagulation PTA. Hgb 11.6, plts 407.   11/5 AM update:  Heparin level sub-therapeutic  Troponin rising (4580>>9983)  Goal of Therapy:  Heparin  level 0.3-0.7 units/ml Monitor platelets by anticoagulation protocol: Yes   Plan:  - Heparin 2000 units re-bolus x 1 - Inc heparin to 1300 units/hr - Will continue to monitor for any signs/symptoms of bleeding and will follow up with heparin level in 8 hours   Narda Bonds, PharmD, [place] Pharmacist Phone: [phone]

## 2021-03-21 NOTE — Progress Notes (Signed)
Critical Result--  03/21/2021 0105  Troponin: 4413   Dr. Josephine Cables notified no new orders.

## 2021-03-21 NOTE — Progress Notes (Signed)
ANTICOAGULATION CONSULT NOTE   Pharmacy Consult for Heparin Indication: chest pain/ACS and atrial fibrillation  Allergies  Allergen Reactions   Oxycodone     Patient Measurements: Height: 5\' 11"  (180.3 cm) Weight: 76.9 kg (169 lb 8.5 oz) IBW/kg (Calculated) : 75.3 Heparin Dosing Weight: 78.5 kg  Vital Signs: Temp: 97.7 F (36.5 C) (11/05 1616) Temp Source: Oral (11/05 1616) BP: 145/61 (11/05 1616) Pulse Rate: 84 (11/05 1600)  Labs: Recent Labs    03/20/21 1757 03/20/21 2004 03/20/21 2210 03/21/21 0017 03/21/21 0236 03/21/21 0418 03/21/21 1417  HGB 11.6*  --   --   --   --  8.9*  --   HCT 35.3*  --   --   --   --  26.5*  --   PLT 407*  --   --   --   --  278  --   APTT 39*  --   --   --   --   --   --   LABPROT 16.6*  --   --   --   --   --   --   INR 1.3*  --   --   --   --   --   --   HEPARINUNFRC  --   --   --   --   --  <0.10* 0.19*  CREATININE 1.49*  --  1.34*  --   --  1.25*  --   TROPONINIHS 3,329*   < > 3,762* 4,413* 6,544* 8,310*  --    < > = values in this interval not displayed.     Estimated Creatinine Clearance: 49.4 mL/min (A) (by C-G formula based on SCr of 1.25 mg/dL (H)).   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 Noted +trops. Pharmacy consulted to start Heparin for  anticoagulation  The patient was not on anticoagulation PTA. Hgb 11.6, plts 407  Heparin level sub-therapeutic despite rate increase  Goal of Therapy:  Heparin level 0.3-0.7 units/ml Monitor platelets by anticoagulation protocol: Yes   Plan:  - Heparin 2000 units re-bolus x 1 - Inc heparin to 1500 units/hr - Will continue to monitor for any signs/symptoms of bleeding and will follow up with heparin level in 8 hours   Hart Robinsons, PharmD Clinical Pharmacist Phone: [phone]

## 2021-03-22 ENCOUNTER — Inpatient Hospital Stay (HOSPITAL_COMMUNITY): Payer: Medicare Other

## 2021-03-22 DIAGNOSIS — B952 Enterococcus as the cause of diseases classified elsewhere: Secondary | ICD-10-CM

## 2021-03-22 DIAGNOSIS — C3490 Malignant neoplasm of unspecified part of unspecified bronchus or lung: Secondary | ICD-10-CM | POA: Diagnosis not present

## 2021-03-22 DIAGNOSIS — I4891 Unspecified atrial fibrillation: Secondary | ICD-10-CM

## 2021-03-22 DIAGNOSIS — R7881 Bacteremia: Secondary | ICD-10-CM

## 2021-03-22 DIAGNOSIS — N183 Chronic kidney disease, stage 3 unspecified: Secondary | ICD-10-CM | POA: Diagnosis not present

## 2021-03-22 DIAGNOSIS — N1832 Chronic kidney disease, stage 3b: Secondary | ICD-10-CM | POA: Diagnosis not present

## 2021-03-22 DIAGNOSIS — A4181 Sepsis due to Enterococcus: Secondary | ICD-10-CM

## 2021-03-22 LAB — URINALYSIS, COMPLETE (UACMP) WITH MICROSCOPIC: RBC / HPF: >50 RBC/hpf (ref 0–5)

## 2021-03-22 LAB — COMPREHENSIVE METABOLIC PANEL
ALT: 14 U/L (ref 0–44)
AST: 40 U/L (ref 15–41)
Albumin: 1.7 g/dL — ABNORMAL LOW (ref 3.5–5.0)
Alkaline Phosphatase: 80 U/L (ref 38–126)
Anion gap: 6 (ref 5–15)
BUN: 15 mg/dL (ref 8–23)
CO2: 20 mmol/L — ABNORMAL LOW (ref 22–32)
Calcium: 7.1 mg/dL — ABNORMAL LOW (ref 8.9–10.3)
Chloride: 102 mmol/L (ref 98–111)
Creatinine, Ser: 1.23 mg/dL (ref 0.61–1.24)
GFR, Estimated: 59 mL/min — ABNORMAL LOW (ref >60)
Glucose, Bld: 195 mg/dL — ABNORMAL HIGH (ref 70–99)
Potassium: 3.2 mmol/L — ABNORMAL LOW (ref 3.5–5.1)
Sodium: 128 mmol/L — ABNORMAL LOW (ref 135–145)
Total Bilirubin: 0.6 mg/dL (ref 0.3–1.2)
Total Protein: 5.7 g/dL — ABNORMAL LOW (ref 6.5–8.1)

## 2021-03-22 LAB — CBC
HCT: 25.7 % — ABNORMAL LOW (ref 39.0–52.0)
Hemoglobin: 8.3 g/dL — ABNORMAL LOW (ref 13.0–17.0)
MCH: 25.3 pg — ABNORMAL LOW (ref 26.0–34.0)
MCHC: 32.3 g/dL (ref 30.0–36.0)
MCV: 78.4 fL — ABNORMAL LOW (ref 80.0–100.0)
Platelets: 261 10*3/uL (ref 150–400)
RBC: 3.28 MIL/uL — ABNORMAL LOW (ref 4.22–5.81)
RDW: 15.3 % (ref 11.5–15.5)
WBC: 9.4 10*3/uL (ref 4.0–10.5)
nRBC: 0 % (ref 0.0–0.2)

## 2021-03-22 LAB — GLUCOSE, CAPILLARY
Glucose-Capillary: 180 mg/dL — ABNORMAL HIGH (ref 70–99)
Glucose-Capillary: 199 mg/dL — ABNORMAL HIGH (ref 70–99)
Glucose-Capillary: 203 mg/dL — ABNORMAL HIGH (ref 70–99)
Glucose-Capillary: 205 mg/dL — ABNORMAL HIGH (ref 70–99)
Glucose-Capillary: 208 mg/dL — ABNORMAL HIGH (ref 70–99)

## 2021-03-22 LAB — HEPARIN LEVEL (UNFRACTIONATED)
Heparin Unfractionated: 0.33 IU/mL (ref 0.30–0.70)
Heparin Unfractionated: 0.49 IU/mL (ref 0.30–0.70)

## 2021-03-22 MED ORDER — SODIUM CHLORIDE 0.9 % IV SOLN
2.0000 g | INTRAVENOUS | Status: DC
  Administered 2021-03-22 – 2021-03-23 (×2): 2 g via INTRAVENOUS
  Filled 2021-03-22 (×2): qty 20

## 2021-03-22 MED ORDER — POTASSIUM CHLORIDE CRYS ER 20 MEQ PO TBCR
20.0000 meq | EXTENDED_RELEASE_TABLET | Freq: Once | ORAL | Status: AC
  Administered 2021-03-22: 20 meq via ORAL
  Filled 2021-03-22: qty 1

## 2021-03-22 MED ORDER — SODIUM CHLORIDE 0.9 % IV SOLN
1.0000 g | Freq: Four times a day (QID) | INTRAVENOUS | Status: DC
  Administered 2021-03-22 – 2021-03-23 (×4): 1 g via INTRAVENOUS
  Filled 2021-03-22 (×9): qty 1000

## 2021-03-22 MED ORDER — INSULIN ASPART 100 UNIT/ML IJ SOLN
0.0000 [IU] | Freq: Every day | INTRAMUSCULAR | Status: DC
  Administered 2021-03-22 – 2021-03-29 (×2): 2 [IU] via SUBCUTANEOUS

## 2021-03-22 MED ORDER — IPRATROPIUM BROMIDE 0.02 % IN SOLN
0.5000 mg | Freq: Three times a day (TID) | RESPIRATORY_TRACT | Status: DC
  Administered 2021-03-23 – 2021-03-30 (×21): 0.5 mg via RESPIRATORY_TRACT
  Filled 2021-03-22 (×23): qty 2.5

## 2021-03-22 MED ORDER — IOHEXOL 9 MG/ML PO SOLN
500.0000 mL | ORAL | Status: AC
  Administered 2021-03-22 (×2): 500 mL via ORAL

## 2021-03-22 MED ORDER — IPRATROPIUM BROMIDE 0.02 % IN SOLN
0.5000 mg | Freq: Three times a day (TID) | RESPIRATORY_TRACT | Status: DC
  Administered 2021-03-22 (×3): 0.5 mg via RESPIRATORY_TRACT
  Filled 2021-03-22 (×3): qty 2.5

## 2021-03-22 MED ORDER — INSULIN ASPART 100 UNIT/ML IJ SOLN
0.0000 [IU] | Freq: Three times a day (TID) | INTRAMUSCULAR | Status: DC
  Administered 2021-03-22: 2 [IU] via SUBCUTANEOUS
  Administered 2021-03-22 (×2): 3 [IU] via SUBCUTANEOUS
  Administered 2021-03-23 (×2): 2 [IU] via SUBCUTANEOUS
  Administered 2021-03-23: 3 [IU] via SUBCUTANEOUS
  Administered 2021-03-24 (×2): 2 [IU] via SUBCUTANEOUS
  Administered 2021-03-25 (×2): 1 [IU] via SUBCUTANEOUS
  Administered 2021-03-26 (×2): 2 [IU] via SUBCUTANEOUS
  Administered 2021-03-27: 1 [IU] via SUBCUTANEOUS
  Administered 2021-03-28 – 2021-03-30 (×6): 2 [IU] via SUBCUTANEOUS
  Administered 2021-03-30: 3 [IU] via SUBCUTANEOUS

## 2021-03-22 MED ORDER — LEVALBUTEROL HCL 0.63 MG/3ML IN NEBU
0.6300 mg | INHALATION_SOLUTION | Freq: Three times a day (TID) | RESPIRATORY_TRACT | Status: DC
Start: 2021-03-22 — End: 2021-03-22
  Administered 2021-03-22 (×3): 0.63 mg via RESPIRATORY_TRACT
  Filled 2021-03-22 (×3): qty 3

## 2021-03-22 MED ORDER — LEVALBUTEROL HCL 0.63 MG/3ML IN NEBU
0.6300 mg | INHALATION_SOLUTION | Freq: Three times a day (TID) | RESPIRATORY_TRACT | Status: DC
Start: 2021-03-23 — End: 2021-03-30
  Administered 2021-03-23 – 2021-03-30 (×21): 0.63 mg via RESPIRATORY_TRACT
  Filled 2021-03-22 (×23): qty 3

## 2021-03-22 MED ORDER — INSULIN GLARGINE-YFGN 100 UNIT/ML ~~LOC~~ SOLN
5.0000 [IU] | Freq: Every day | SUBCUTANEOUS | Status: DC
  Administered 2021-03-22 – 2021-03-30 (×9): 5 [IU] via SUBCUTANEOUS
  Filled 2021-03-22 (×10): qty 0.05

## 2021-03-22 NOTE — Progress Notes (Signed)
Dr. Carles Collet made aware that blood was noted in patient's urine. Will continue to monitor output.

## 2021-03-22 NOTE — Consult Note (Signed)
[facility]  Total days of antibiotics 3              Reason for Consult: e.faecalis bacteremia    Referring Physician: tat  Active Problems:   Type 2 diabetes mellitus without complication, without long-term current use of insulin (HCC)   CKD (chronic kidney disease) stage 3, GFR 30-59 ml/min (HCC)   S/P CABG x 4   Adenocarcinoma of lung, stage 4 (HCC)   STEMI (ST elevation myocardial infarction) ([place])   Hyponatremia   Sepsis ([place])   Sepsis due to Enterococcus ([place])   Bacteremia due to Enterococcus   Atrial fibrillation with rapid ventricular response (HCC)    HPI: [name] is a 80 y.o. male with history of T2DM, CAD s/p CABG, afib with RVR, CKD 3,and hx of lung ca admitted on 11/4 with sepsis on 11/4. He presented to the ED with several days of feeling poorly, fevers, chills, poor po intake, nauseau & vomiting x 4 days, he was found to be ill appearing, hypoxic @ 84% at room air with afib with RVR. Labs significant for leukocytosis of 19.5K, left shift. LA 2.09. Cr 1.5. his EKg showed ST depression in anterior leads concerning for STEMI, thought to be related to underlying infection. He was started on broad spectrum abtx to cover healthcare associated pna plus metronidazole due to GI symptoms. Covid and flu PCR were negative. Infectoius work up did reveal + blood cx with e.faecalis. 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 UA appeared cloudy but test was not completed. Over 48hrs, leukocytosis and LA is improved.  Troponin(HS) still elevated, continues on heparin for ACS protocol.  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   LIMA to LAD, SVG to D1, SVG to OM2, SVG to RCA, open vein harvest right thigh and lower leg   Spontaneous pneumothorax 09/15/2010   right    Allergies:   Allergies  Allergen Reactions   Oxycodone     MEDICATIONS:  aspirin EC  81 mg Oral Daily   atorvastatin  80 mg Oral Daily   budesonide (PULMICORT) nebulizer solution  0.5 mg Nebulization BID   Chlorhexidine Gluconate Cloth  6 each Topical Daily   escitalopram  10 mg Oral Daily   insulin aspart  0-5 Units Subcutaneous QHS   insulin aspart  0-9 Units Subcutaneous TID WC   insulin glargine-yfgn  5 Units Subcutaneous Daily   ipratropium  0.5 mg Nebulization Q8H   levalbuterol  0.63 mg Nebulization Q8H   metoprolol tartrate  12.5 mg Oral BID   tamsulosin  0.4 mg Oral QPC supper    Social History   Tobacco Use   Smoking status: Former    Packs/day: 1.00    Years: 33.00    Pack years: 33.00    Types: Cigarettes    Quit date: 05/17/1987    Years since quitting: 33.8   Smokeless tobacco: Never  Vaping Use   Vaping Use: Never used  Substance Use Topics   Alcohol use: No   Drug use: No    Family History  Problem Relation Age of Onset   Aneurysm Mother    Heart attack Father    Colon cancer Maternal Uncle    Cancer Brother  Gastric cancer Neg Hx    Esophageal cancer Neg Hx     Review of Systems -  Per hpi.  OBJECTIVE: Temp:  [97.4 F (36.3 C)-97.7 F (36.5 C)] 97.4 F (36.3 C) (11/06 0800) Pulse Rate:  [77-89] 88 (11/06 0900) Resp:  [18-31] 27 (11/06 1000) BP: (99-160)/(55-86) 160/86 (11/06 1000) SpO2:  [95 %-100 %] 98 % (11/06 0900) Weight:  [78.1 kg] 78.1 kg (11/06 0500) Did not examine patient since not on site.  LABS: Results for orders placed or performed during the hospital encounter of 03/20/21 (from the past 48 hour(s))  Lactic acid, plasma     Status: Abnormal   Collection Time: 03/20/21  5:56 PM  Result Value Ref Range   Lactic Acid, Venous 2.9 (HH) 0.5 - 1.9 mmol/L    Comment: CRITICAL RESULT CALLED TO, READ BACK BY AND VERIFIED WITH: RUDD,K ON 03/20/21 AT 1830 BY LOY,C Performed at [facility], [address] [place], [place] [address]    Comprehensive metabolic panel     Status: Abnormal   Collection Time: 03/20/21  5:57 PM  Result Value Ref Range   Sodium 124 (L) 135 - 145 mmol/L   Potassium 2.8 (L) 3.5 - 5.1 mmol/L   Chloride 89 (L) 98 - 111 mmol/L   CO2 22 22 - 32 mmol/L   Glucose, Bld 237 (H) 70 - 99 mg/dL    Comment: Glucose reference range applies only to samples taken after fasting for at least 8 hours.   BUN 15 8 - 23 mg/dL   Creatinine, Ser 1.49 (H) 0.61 - 1.24 mg/dL   Calcium 8.1 (L) 8.9 - 10.3 mg/dL   Total Protein 8.3 (H) 6.5 - 8.1 g/dL   Albumin 2.6 (L) 3.5 - 5.0 g/dL   AST 32 15 - 41 U/L   ALT 14 0 - 44 U/L   Alkaline Phosphatase 130 (H) 38 - 126 U/L   Total Bilirubin 1.1 0.3 - 1.2 mg/dL   GFR, Estimated 47 (L) >60 mL/min    Comment: (NOTE) Calculated using the CKD-EPI Creatinine Equation (2021)    Anion gap 13 5 - 15    Comment: Performed at [facility], [address] [place], [place] [address]  CBC WITH DIFFERENTIAL     Status: Abnormal   Collection Time: 03/20/21  5:57 PM  Result Value Ref Range   WBC 19.5 (H) 4.0 - 10.5 K/uL   RBC 4.66 4.22 - 5.81 MIL/uL   Hemoglobin 11.6 (L) 13.0 - 17.0 g/dL   HCT 35.3 (L) 39.0 - 52.0 %   MCV 75.8 (L) 80.0 - 100.0 fL   MCH 24.9 (L) 26.0 - 34.0 pg   MCHC 32.9 30.0 - 36.0 g/dL   RDW 15.2 11.5 - 15.5 %   Platelets 407 (H) 150 - 400 K/uL   nRBC 0.0 0.0 - 0.2 %   Neutrophils Relative % 85 %   Neutro Abs 16.6 (H) 1.7 - 7.7 K/uL   Lymphocytes Relative 10 %   Lymphs Abs 1.9 0.7 - 4.0 K/uL   Monocytes Relative 4 %   Monocytes Absolute 0.7 0.1 - 1.0 K/uL   Eosinophils Relative 0 %   Eosinophils Absolute 0.0 0.0 - 0.5 K/uL   Basophils Relative 0 %   Basophils Absolute 0.0 0.0 - 0.1 K/uL   Immature Granulocytes 1 %   Abs Immature Granulocytes 0.26 (H) 0.00 - 0.07 K/uL    Comment: Performed at [facility], [address], [place], [place] [address]  Protime-INR     Status: Abnormal   Collection Time: 03/20/21  5:57 PM  Result Value Ref Range    Prothrombin Time 16.6 (H) 11.4 - 15.2 seconds   INR 1.3 (H) 0.8 - 1.2    Comment: (NOTE) INR goal varies based on device and disease states. Performed at [facility], [address] [place], [place] [address]   APTT     Status: Abnormal   Collection Time: 03/20/21  5:57 PM  Result Value Ref Range   aPTT 39 (H) 24 - 36 seconds    Comment:        IF BASELINE aPTT IS ELEVATED, SUGGEST PATIENT RISK ASSESSMENT BE USED TO DETERMINE APPROPRIATE ANTICOAGULANT THERAPY. Performed at [facility], [address], [place], [place] [address]   Blood Culture (routine x 2)     Status: None (Preliminary result)   Collection Time: 03/20/21  5:57 PM   Specimen: BLOOD LEFT ARM  Result Value Ref Range   Specimen Description BLOOD LEFT ARM    Special Requests      BOTTLES DRAWN AEROBIC ONLY Blood Culture adequate volume   Culture      NO GROWTH < 24 HOURS Performed at [facility], [address], [place], [place] [address]    Report Status PENDING   Blood Culture (routine x 2)     Status: Abnormal (Preliminary result)   Collection Time: 03/20/21  5:57 PM   Specimen: Right Antecubital; Blood  Result Value Ref Range   Specimen Description      RIGHT ANTECUBITAL Performed at [facility], [address], [place], [place] [address]    Special Requests      BOTTLES DRAWN AEROBIC AND ANAEROBIC Blood Culture results may not be optimal due to an inadequate volume of blood received in culture bottles Performed at [facility], [address] [place], [place] [address]    Culture  Setup Time      IN BOTH AEROBIC AND ANAEROBIC BOTTLES GRAM POSITIVE COCCI CRITICAL RESULT CALLED TO, READ BACK BY AND VERIFIED WITH: MILLS,M 1629 03/21/2021 COLEMAN,R CRITICAL RESULT CALLED TO, READ BACK BY AND VERIFIED WITH: RN T.ALLEN ON [id number] AT 2023 BY E.PARRISH    Culture (A)     ENTEROCOCCUS FAECALIS SUSCEPTIBILITIES TO FOLLOW Performed at [facility] [address], [place], [place] [address]    Report  Status PENDING   Troponin I (High Sensitivity)     Status: Abnormal   Collection Time: 03/20/21  5:57 PM  Result Value Ref Range   Troponin I (High Sensitivity) 3,329 (HH) <18 ng/L    Comment: CRITICAL RESULT CALLED TO, READ BACK BY AND VERIFIED WITH: PRUITT,G ON 03/20/21 AT 1910 BY LOY,C (NOTE) Elevated high sensitivity troponin I (hsTnI) values and significant  changes across serial measurements may suggest ACS but many other  chronic and acute conditions are known to elevate hsTnI results.  Refer to the Links section for chest pain algorithms and additional  guidance. Performed at [facility], [address], [place], [place] [address]   Blood Culture ID Panel (Reflexed)     Status: Abnormal   Collection Time: 03/20/21  5:57 PM  Result Value Ref Range   Enterococcus faecalis DETECTED (A) NOT DETECTED    Comment: CRITICAL RESULT CALLED TO, READ BACK BY AND VERIFIED WITH: RN T.ALLEN ON [id number] AT 2023 BY E.PARRISH    Enterococcus Faecium NOT DETECTED NOT DETECTED   Listeria monocytogenes NOT DETECTED NOT DETECTED   Staphylococcus species NOT  DETECTED NOT DETECTED   Staphylococcus aureus (BCID) NOT DETECTED NOT DETECTED   Staphylococcus epidermidis NOT DETECTED NOT DETECTED   Staphylococcus lugdunensis NOT DETECTED NOT DETECTED   Streptococcus species NOT DETECTED NOT DETECTED   Streptococcus agalactiae NOT DETECTED NOT DETECTED   Streptococcus pneumoniae NOT DETECTED NOT DETECTED   Streptococcus pyogenes NOT DETECTED NOT DETECTED   A.calcoaceticus-baumannii NOT DETECTED NOT DETECTED   Bacteroides fragilis NOT DETECTED NOT DETECTED   Enterobacterales NOT DETECTED NOT DETECTED   Enterobacter cloacae complex NOT DETECTED NOT DETECTED   Escherichia coli NOT DETECTED NOT DETECTED   Klebsiella aerogenes NOT DETECTED NOT DETECTED   Klebsiella oxytoca NOT DETECTED NOT DETECTED   Klebsiella pneumoniae NOT DETECTED NOT DETECTED   Proteus species NOT DETECTED NOT DETECTED   Salmonella  species NOT DETECTED NOT DETECTED   Serratia marcescens NOT DETECTED NOT DETECTED   Haemophilus influenzae NOT DETECTED NOT DETECTED   Neisseria meningitidis NOT DETECTED NOT DETECTED   Pseudomonas aeruginosa NOT DETECTED NOT DETECTED   Stenotrophomonas maltophilia NOT DETECTED NOT DETECTED   Candida albicans NOT DETECTED NOT DETECTED   Candida auris NOT DETECTED NOT DETECTED   Candida glabrata NOT DETECTED NOT DETECTED   Candida krusei NOT DETECTED NOT DETECTED   Candida parapsilosis NOT DETECTED NOT DETECTED   Candida tropicalis NOT DETECTED NOT DETECTED   Cryptococcus neoformans/gattii NOT DETECTED NOT DETECTED   Vancomycin resistance NOT DETECTED NOT DETECTED    Comment: Performed at [facility], [address] [place], [place] [address]  Resp Panel by RT-PCR (Flu A&B, Covid) Nasopharyngeal Swab     Status: None   Collection Time: 03/20/21  6:25 PM   Specimen: Nasopharyngeal Swab; Nasopharyngeal(NP) swabs in vial transport medium  Result Value Ref Range   SARS Coronavirus 2 by RT PCR NEGATIVE NEGATIVE    Comment: (NOTE) SARS-CoV-2 target nucleic acids are NOT DETECTED.  The SARS-CoV-2 RNA is generally detectable in upper respiratory specimens during the acute phase of infection. The lowest concentration of SARS-CoV-2 viral copies this assay can detect is 138 copies/mL. A negative result does not preclude SARS-Cov-2 infection and should not be used as the sole basis for treatment or other patient management decisions. A negative result may occur with  improper specimen collection/handling, submission of specimen other than nasopharyngeal swab, presence of viral mutation(s) within the areas targeted by this assay, and inadequate number of viral copies(<138 copies/mL). A negative result must be combined with clinical observations, patient history, and epidemiological information. The expected result is Negative.  Fact Sheet for Patients:   EntrepreneurPulse.com.au  Fact Sheet for Healthcare Providers:  IncredibleEmployment.be  This test is no t yet approved or cleared by the [place] FDA and  has been authorized for detection and/or diagnosis of SARS-CoV-2 by FDA under an Emergency Use Authorization (EUA). This EUA will remain  in effect (meaning this test can be used) for the duration of the COVID-19 declaration under Section 564(b)(1) of the Act, 21 U.S.C.section 360bbb-3(b)(1), unless the authorization is terminated  or revoked sooner.       Influenza A by PCR NEGATIVE NEGATIVE   Influenza B by PCR NEGATIVE NEGATIVE    Comment: (NOTE) The Xpert Xpress SARS-CoV-2/FLU/RSV plus assay is intended as an aid in the diagnosis of influenza from Nasopharyngeal swab specimens and should not be used as a sole basis for treatment. Nasal washings and aspirates are unacceptable for Xpert Xpress SARS-CoV-2/FLU/RSV testing.  Fact Sheet for Patients: EntrepreneurPulse.com.au  Fact Sheet for Healthcare Providers: IncredibleEmployment.be  This test is not yet approved or cleared by the [place] and has been authorized for detection and/or diagnosis of SARS-CoV-2 by FDA under an Emergency Use Authorization (EUA). This EUA will remain in effect (meaning this test can be used) for the duration of the COVID-19 declaration under Section 564(b)(1) of the Act, 21 U.S.C. section 360bbb-3(b)(1), unless the authorization is terminated or revoked.  Performed at [facility], [address], [place], [place] [address]   Lactic acid, plasma     Status: Abnormal   Collection Time: 03/20/21  8:04 PM  Result Value Ref Range   Lactic Acid, Venous 2.1 (HH) 0.5 - 1.9 mmol/L    Comment: CRITICAL VALUE NOTED.  VALUE IS CONSISTENT WITH PREVIOUSLY REPORTED AND CALLED VALUE. Performed at [facility], [address] [place], [place] [address]   Troponin I (High  Sensitivity)     Status: Abnormal   Collection Time: 03/20/21  8:04 PM  Result Value Ref Range   Troponin I (High Sensitivity) 3,016 (HH) <18 ng/L    Comment: DELTA CHECK NOTED CRITICAL RESULT CALLED TO, READ BACK BY AND VERIFIED WITH: PRUITT,G ON 03/20/21 AT 2120 BY LOY,C (NOTE) Elevated high sensitivity troponin I (hsTnI) values and significant  changes across serial measurements may suggest ACS but many other  chronic and acute conditions are known to elevate hsTnI results.  Refer to the Links section for chest pain algorithms and additional  guidance. Performed at [facility], [address], [place], [place] [address]   Troponin I (High Sensitivity)     Status: Abnormal   Collection Time: 03/20/21 10:10 PM  Result Value Ref Range   Troponin I (High Sensitivity) 3,762 (HH) <18 ng/L    Comment: DELTA CHECK NOTED CRITICAL RESULT CALLED TO, READ BACK BY AND VERIFIED WITH: BRAME,M ON 03/20/21 AT 2250 BY LOY,C (NOTE) Elevated high sensitivity troponin I (hsTnI) values and significant  changes across serial measurements may suggest ACS but many other  chronic and acute conditions are known to elevate hsTnI results.  Refer to the Links section for chest pain algorithms and additional  guidance. Performed at [facility], [address] [place], [place] [address]   Comprehensive metabolic panel     Status: Abnormal   Collection Time: 03/20/21 10:10 PM  Result Value Ref Range   Sodium 127 (L) 135 - 145 mmol/L   Potassium 3.3 (L) 3.5 - 5.1 mmol/L   Chloride 98 98 - 111 mmol/L   CO2 17 (L) 22 - 32 mmol/L   Glucose, Bld 218 (H) 70 - 99 mg/dL    Comment: Glucose reference range applies only to samples taken after fasting for at least 8 hours.   BUN 14 8 - 23 mg/dL   Creatinine, Ser 1.34 (H) 0.61 - 1.24 mg/dL   Calcium 7.1 (L) 8.9 - 10.3 mg/dL   Total Protein 6.4 (L) 6.5 - 8.1 g/dL   Albumin 2.0 (L) 3.5 - 5.0 g/dL   AST 27 15 - 41 U/L   ALT 12 0 - 44 U/L   Alkaline Phosphatase 100  38 - 126 U/L   Total Bilirubin 1.1 0.3 - 1.2 mg/dL   GFR, Estimated 53 (L) >60 mL/min    Comment: (NOTE) Calculated using the CKD-EPI Creatinine Equation (2021)    Anion gap 12 5 - 15    Comment: Performed at [facility], [address], [place], [place] [address]  Lactic acid, plasma     Status: None   Collection Time:  03/20/21 10:10 PM  Result Value Ref Range   Lactic Acid, Venous 1.7 0.5 - 1.9 mmol/L    Comment: Performed at [facility], [address], [place], [place] [address]  MRSA Next Gen by PCR, Nasal     Status: None   Collection Time: 03/20/21 11:37 PM   Specimen: Nasal Mucosa; Nasal Swab  Result Value Ref Range   MRSA by PCR Next Gen NOT DETECTED NOT DETECTED    Comment: (NOTE) The GeneXpert MRSA Assay (FDA approved for NASAL specimens only), is one component of a comprehensive MRSA colonization surveillance program. It is not intended to diagnose MRSA infection nor to guide or monitor treatment for MRSA infections. Test performance is not FDA approved in patients less than 58 years old. Performed at [facility], [address] [place], [place] [address]   Glucose, capillary     Status: Abnormal   Collection Time: 03/21/21 12:08 AM  Result Value Ref Range   Glucose-Capillary 218 (H) 70 - 99 mg/dL    Comment: Glucose reference range applies only to samples taken after fasting for at least 8 hours.  Lactic acid, plasma     Status: None   Collection Time: 03/21/21 12:17 AM  Result Value Ref Range   Lactic Acid, Venous 1.2 0.5 - 1.9 mmol/L    Comment: Performed at [facility], [address], [place], [place] [address]  Troponin I (High Sensitivity)     Status: Abnormal   Collection Time: 03/21/21 12:17 AM  Result Value Ref Range   Troponin I (High Sensitivity) 4,413 (HH) <18 ng/L    Comment: DELTA CHECK NOTED CRITICAL RESULT CALLED TO, READ BACK BY AND VERIFIED WITH: ELLER,J @ 0058 ON 03/21/21 BY JUW (NOTE) Elevated high sensitivity troponin I (hsTnI) values and  significant  changes across serial measurements may suggest ACS but many other  chronic and acute conditions are known to elevate hsTnI results.  Refer to the Links section for chest pain algorithms and additional  guidance. Performed at [facility], [address] [place] [address], [place], [place] [address]   Troponin I (High Sensitivity)     Status: Abnormal   Collection Time: 03/21/21  2:36 AM  Result Value Ref Range   Troponin I (High Sensitivity) 6,544 (HH) <18 ng/L    Comment: DELTA CHECK NOTED CRITICAL RESULT CALLED TO, READ BACK BY AND VERIFIED WITH: ALLEN,T @ 0334 ON 03/21/21 BY JUW (NOTE) Elevated high sensitivity troponin I (hsTnI) values and significant  changes across serial measurements may suggest ACS but many other  chronic and acute conditions are known to elevate hsTnI results.  Refer to the Links section for chest pain algorithms and additional  guidance. Performed at [facility], [address], [place], [place] [address]   Heparin level (unfractionated)     Status: Abnormal   Collection Time: 03/21/21  4:18 AM  Result Value Ref Range   Heparin Unfractionated <0.10 (L) 0.30 - 0.70 IU/mL    Comment: (NOTE) The clinical reportable range upper limit is being lowered to >1.10 to align with the FDA approved guidance for the current laboratory assay.  If heparin results are below expected values, and patient dosage has  been confirmed, suggest follow up testing of antithrombin III levels. Performed at [facility], [address], [place],  [address]   Basic metabolic panel     Status: Abnormal   Collection Time: 03/21/21  4:18 AM  Result Value Ref Range   Sodium 128 (L) 135 - 145 mmol/L  Potassium 3.3 (L) 3.5 - 5.1 mmol/L   Chloride 102 98 - 111 mmol/L   CO2 19 (L) 22 - 32 mmol/L   Glucose, Bld 143 (H) 70 - 99 mg/dL    Comment: Glucose reference range applies only to samples taken after fasting for at least 8 hours.   BUN 13 8 - 23 mg/dL   Creatinine, Ser 1.25 (H)  0.61 - 1.24 mg/dL   Calcium 7.0 (L) 8.9 - 10.3 mg/dL   GFR, Estimated 58 (L) >60 mL/min    Comment: (NOTE) Calculated using the CKD-EPI Creatinine Equation (2021)    Anion gap 7 5 - 15    Comment: Performed at [facility], [address] [place], [place] [address]  CBC     Status: Abnormal   Collection Time: 03/21/21  4:18 AM  Result Value Ref Range   WBC 16.0 (H) 4.0 - 10.5 K/uL   RBC 3.50 (L) 4.22 - 5.81 MIL/uL   Hemoglobin 8.9 (L) 13.0 - 17.0 g/dL    Comment: Reticulocyte Hemoglobin testing may be clinically indicated, consider ordering this additional test [id number]    HCT 26.5 (L) 39.0 - 52.0 %   MCV 75.7 (L) 80.0 - 100.0 fL   MCH 25.4 (L) 26.0 - 34.0 pg   MCHC 33.6 30.0 - 36.0 g/dL   RDW 15.1 11.5 - 15.5 %   Platelets 278 150 - 400 K/uL   nRBC 0.0 0.0 - 0.2 %    Comment: Performed at [facility], [address] [place], [place] [address]  Troponin I (High Sensitivity)     Status: Abnormal   Collection Time: 03/21/21  4:18 AM  Result Value Ref Range   Troponin I (High Sensitivity) 8,310 (HH) <18 ng/L    Comment: DELTA CHECK NOTED CRITICAL RESULT CALLED TO, READ BACK BY AND VERIFIED WITH: MAYNARD R @ 0557 ON [id number] BY HENDERSON L (NOTE) Elevated high sensitivity troponin I (hsTnI) values and significant  changes across serial measurements may suggest ACS but many other  chronic and acute conditions are known to elevate hsTnI results.  Refer to the Links section for chest pain algorithms and additional  guidance. Performed at [facility], [address] [place], [place] [address]   Glucose, capillary     Status: Abnormal   Collection Time: 03/21/21  4:23 AM  Result Value Ref Range   Glucose-Capillary 125 (H) 70 - 99 mg/dL    Comment: Glucose reference range applies only to samples taken after fasting for at least 8 hours.  Procalcitonin - Baseline     Status: None   Collection Time: 03/21/21  5:09 AM  Result Value Ref Range   Procalcitonin 1.08 ng/mL     Comment:        Interpretation: PCT > 0.5 ng/mL and <= 2 ng/mL: Systemic infection (sepsis) is possible, but other conditions are known to elevate PCT as well. (NOTE)       Sepsis PCT Algorithm           Lower Respiratory Tract                                      Infection PCT Algorithm    ----------------------------     ----------------------------         PCT < 0.25 ng/mL                PCT < 0.10 ng/mL  Strongly encourage             Strongly discourage   discontinuation of antibiotics    initiation of antibiotics    ----------------------------     -----------------------------       PCT 0.25 - 0.50 ng/mL            PCT 0.10 - 0.25 ng/mL               OR       >80% decrease in PCT            Discourage initiation of                                            antibiotics      Encourage discontinuation           of antibiotics    ----------------------------     -----------------------------         PCT >= 0.50 ng/mL              PCT 0.26 - 0.50 ng/mL                AND       <80% decrease in PCT             Encourage initiation of                                             antibiotics       Encourage continuation           of antibiotics    ----------------------------     -----------------------------        PCT >= 0.50 ng/mL                  PCT > 0.50 ng/mL               AND         increase in PCT                  Strongly encourage                                      initiation of antibiotics    Strongly encourage escalation           of antibiotics                                     -----------------------------                                           PCT <= 0.25 ng/mL                                                 OR                                        >  80% decrease in PCT                                      Discontinue / Do not initiate                                             antibiotics  Performed at [facility], [address]  [place], [place] [address]   Glucose, capillary     Status: Abnormal   Collection Time: 03/21/21  7:31 AM  Result Value Ref Range   Glucose-Capillary 155 (H) 70 - 99 mg/dL    Comment: Glucose reference range applies only to samples taken after fasting for at least 8 hours.  Glucose, capillary     Status: Abnormal   Collection Time: 03/21/21 11:20 AM  Result Value Ref Range   Glucose-Capillary 174 (H) 70 - 99 mg/dL    Comment: Glucose reference range applies only to samples taken after fasting for at least 8 hours.  Heparin level (unfractionated)     Status: Abnormal   Collection Time: 03/21/21  2:17 PM  Result Value Ref Range   Heparin Unfractionated 0.19 (L) 0.30 - 0.70 IU/mL    Comment: (NOTE) The clinical reportable range upper limit is being lowered to >1.10 to align with the FDA approved guidance for the current laboratory assay.  If heparin results are below expected values, and patient dosage has  been confirmed, suggest follow up testing of antithrombin III levels. Performed at [facility], [address], [place], [place] [address]   Glucose, capillary     Status: Abnormal   Collection Time: 03/21/21  4:13 PM  Result Value Ref Range   Glucose-Capillary 197 (H) 70 - 99 mg/dL    Comment: Glucose reference range applies only to samples taken after fasting for at least 8 hours.  Glucose, capillary     Status: Abnormal   Collection Time: 03/21/21  8:52 PM  Result Value Ref Range   Glucose-Capillary 251 (H) 70 - 99 mg/dL    Comment: Glucose reference range applies only to samples taken after fasting for at least 8 hours.  Glucose, capillary     Status: Abnormal   Collection Time: 03/22/21 12:43 AM  Result Value Ref Range   Glucose-Capillary 199 (H) 70 - 99 mg/dL    Comment: Glucose reference range applies only to samples taken after fasting for at least 8 hours.  Heparin level (unfractionated)     Status: None   Collection Time: 03/22/21  1:40 AM  Result Value Ref Range    Heparin Unfractionated 0.33 0.30 - 0.70 IU/mL    Comment: (NOTE) The clinical reportable range upper limit is being lowered to >1.10 to align with the FDA approved guidance for the current laboratory assay.  If heparin results are below expected values, and patient dosage has  been confirmed, suggest follow up testing of antithrombin III levels. Performed at [facility], [address] [place], [place] [address]   CBC     Status: Abnormal   Collection Time: 03/22/21  1:40 AM  Result Value Ref Range   WBC 9.4 4.0 - 10.5 K/uL   RBC 3.28 (L) 4.22 - 5.81 MIL/uL   Hemoglobin 8.3 (L) 13.0 - 17.0 g/dL   HCT 25.7 (L) 39.0 - 52.0 %  MCV 78.4 (L) 80.0 - 100.0 fL   MCH 25.3 (L) 26.0 - 34.0 pg   MCHC 32.3 30.0 - 36.0 g/dL   RDW 15.3 11.5 - 15.5 %   Platelets 261 150 - 400 K/uL   nRBC 0.0 0.0 - 0.2 %    Comment: Performed at [facility], [address] [place], [place] [address]  Comprehensive metabolic panel     Status: Abnormal   Collection Time: 03/22/21  1:40 AM  Result Value Ref Range   Sodium 128 (L) 135 - 145 mmol/L   Potassium 3.2 (L) 3.5 - 5.1 mmol/L   Chloride 102 98 - 111 mmol/L   CO2 20 (L) 22 - 32 mmol/L   Glucose, Bld 195 (H) 70 - 99 mg/dL    Comment: Glucose reference range applies only to samples taken after fasting for at least 8 hours.   BUN 15 8 - 23 mg/dL   Creatinine, Ser 1.23 0.61 - 1.24 mg/dL   Calcium 7.1 (L) 8.9 - 10.3 mg/dL   Total Protein 5.7 (L) 6.5 - 8.1 g/dL   Albumin 1.7 (L) 3.5 - 5.0 g/dL   AST 40 15 - 41 U/L   ALT 14 0 - 44 U/L   Alkaline Phosphatase 80 38 - 126 U/L   Total Bilirubin 0.6 0.3 - 1.2 mg/dL   GFR, Estimated 59 (L) >60 mL/min    Comment: (NOTE) Calculated using the CKD-EPI Creatinine Equation (2021)    Anion gap 6 5 - 15    Comment: Performed at [facility], [address] [place], [place] [address]  Glucose, capillary     Status: Abnormal   Collection Time: 03/22/21  5:06 AM  Result Value Ref Range   Glucose-Capillary 180 (H) 70  - 99 mg/dL    Comment: Glucose reference range applies only to samples taken after fasting for at least 8 hours.  Urinalysis, Complete w Microscopic Urine, Clean Catch     Status: Abnormal (Preliminary result)   Collection Time: 03/22/21  8:27 AM  Result Value Ref Range   Color, Urine RED (A) YELLOW    Comment: BIOCHEMICALS MAY BE AFFECTED BY COLOR   APPearance CLOUDY (A) CLEAR   Specific Gravity, Urine PENDING 1.005 - 1.030   pH PENDING 5.0 - 8.0   Glucose, UA (A) NEGATIVE mg/dL    TEST NOT REPORTED DUE TO COLOR INTERFERENCE OF URINE PIGMENT   Hgb urine dipstick (A) NEGATIVE    TEST NOT REPORTED DUE TO COLOR INTERFERENCE OF URINE PIGMENT   Bilirubin Urine (A) NEGATIVE    TEST NOT REPORTED DUE TO COLOR INTERFERENCE OF URINE PIGMENT   Ketones, ur (A) NEGATIVE mg/dL    TEST NOT REPORTED DUE TO COLOR INTERFERENCE OF URINE PIGMENT   Protein, ur (A) NEGATIVE mg/dL    TEST NOT REPORTED DUE TO COLOR INTERFERENCE OF URINE PIGMENT   Nitrite (A) NEGATIVE    TEST NOT REPORTED DUE TO COLOR INTERFERENCE OF URINE PIGMENT   Leukocytes,Ua (A) NEGATIVE    TEST NOT REPORTED DUE TO COLOR INTERFERENCE OF URINE PIGMENT   Squamous Epithelial / LPF 0-5 0 - 5   Non Squamous Epithelial PRESENT (A) NONE SEEN   WBC, UA 6-10 0 - 5 WBC/hpf   RBC / HPF >50 0 - 5 RBC/hpf   Bacteria, UA FEW (A) NONE SEEN   RBC Casts, UA PRESENT     Comment: Performed at [facility], [address] [place], [place] [address]  Heparin level (unfractionated)  Status: None   Collection Time: 03/22/21 10:14 AM  Result Value Ref Range   Heparin Unfractionated 0.49 0.30 - 0.70 IU/mL    Comment: (NOTE) The clinical reportable range upper limit is being lowered to >1.10 to align with the FDA approved guidance for the current laboratory assay.  If heparin results are below expected values, and patient dosage has  been confirmed, suggest follow up testing of antithrombin III levels. Performed at [facility], [address]  [place],  [address]     MICRO:  IMAGING: DG Chest Port 1 View  Result Date: 03/20/2021 CLINICAL DATA:  Questionable sepsis. EXAM: PORTABLE CHEST 1 VIEW COMPARISON:  Chest x-ray 12/27/2017. FINDINGS: Patient is status post cardiac surgery. There are atherosclerotic calcifications of the aorta. There are new reticulonodular opacities in both lower lungs, right greater than left. Costophrenic angles are clear. There is no pneumothorax. No acute fractures are seen. IMPRESSION: 1. Reticulonodular opacities in the bilateral lung bases, right greater than left. Findings may be related to increasing pulmonary nodules and/or superimposed infection. Electronically Signed   By: Ronney Asters M.D.   On: 03/20/2021 18:38   ECHOCARDIOGRAM COMPLETE  Result Date: 03/21/2021    ECHOCARDIOGRAM REPORT   Patient Name:   [name] Date of Exam: 03/21/2021 Medical Rec #:  [id number]     Height:       71.0 in Accession #:    [id number]    Weight:       169.5 lb Date of Birth:  [birth year]     BSA:          1.966 m Patient Age:    80 years      BP:           1417/63 mmHg Patient Gender: M             HR:           67 bpm. Exam Location:  [facility] Procedure: 2D Echo, Cardiac Doppler and Color Doppler Indications:    STEMI (ST elevation myocardial infarction) [facility]) [[id number]]  History:        Patient has prior history of Echocardiogram examinations, most                 recent 01/09/2021. CAD; Risk Factors:Diabetes and Hypertension.  Sonographer:    Bernadene Person RDCS Referring Phys: [id number] [place]  1. Distal septal, apical mid and basal inferior wall hypokinesis . Left ventricular ejection fraction, by estimation, is 35 to 40%. The left ventricle has moderately decreased function. The left ventricle demonstrates regional wall motion abnormalities (see scoring diagram/findings for description). Left ventricular diastolic parameters were normal.  2. Right ventricular systolic function is normal. The right  ventricular size is normal. There is moderately elevated pulmonary artery systolic pressure.  3. Left atrial size was moderately dilated.  4. 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 The mitral valve is abnormal. Mild mitral valve regurgitation. No evidence of mitral stenosis.  5. The aortic valve is tricuspid. There is mild calcification of the aortic valve. Aortic valve regurgitation is not visualized. Mild aortic valve sclerosis is present, with no evidence of aortic valve stenosis.  6. The inferior vena cava is normal in size with greater than 50% respiratory variability, suggesting right atrial pressure of 3 mmHg. FINDINGS  Left Ventricle: Distal septal, apical mid and basal inferior wall hypokinesis. Left ventricular ejection fraction, by estimation, is 35 to 40%. The left ventricle has moderately decreased function. The left ventricle demonstrates  regional wall motion abnormalities. The left ventricular internal cavity size was normal in size. There is no left ventricular hypertrophy. Left ventricular diastolic parameters were normal. Right Ventricle: The right ventricular size is normal. No increase in right ventricular wall thickness. Right ventricular systolic function is normal. There is moderately elevated pulmonary artery systolic pressure. The tricuspid regurgitant velocity is 3.29 m/s, and with an assumed right atrial pressure of 8 mmHg, the estimated right ventricular systolic pressure is 42.5 mmHg. Left Atrium: Left atrial size was moderately dilated. Right Atrium: Right atrial size was normal in size. Pericardium: There is no evidence of pericardial effusion. 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 The mitral valve is abnormal. There is mild thickening of the mitral valve leaflet(s). There is mild calcification of the mitral valve leaflet(s). Mild mitral valve regurgitation. No evidence of mitral valve stenosis. Tricuspid Valve: The tricuspid valve is normal in structure. Tricuspid valve regurgitation is mild . No evidence  of tricuspid stenosis. Aortic Valve: The aortic valve is tricuspid. There is mild calcification of the aortic valve. Aortic valve regurgitation is not visualized. Mild aortic valve sclerosis is present, with no evidence of aortic valve stenosis. Pulmonic Valve: The pulmonic valve was normal in structure. Pulmonic valve regurgitation is trivial. No evidence of pulmonic stenosis. Aorta: The aortic root is normal in size and structure. Venous: The inferior vena cava is normal in size with greater than 50% respiratory variability, suggesting right atrial pressure of 3 mmHg. IAS/Shunts: The interatrial septum was not assessed.  LEFT VENTRICLE PLAX 2D LVIDd:         5.40 cm LVIDs:         3.80 cm LV PW:         1.30 cm LV IVS:        0.80 cm LVOT diam:     2.00 cm LV SV:         51 LV SV Index:   26 LVOT Area:     3.14 cm  LV Volumes (MOD) LV vol d, MOD A2C: 110.0 ml LV vol d, MOD A4C: 97.8 ml LV vol s, MOD A2C: 76.2 ml LV vol s, MOD A4C: 62.3 ml LV SV MOD A2C:     33.8 ml LV SV MOD A4C:     97.8 ml LV SV MOD BP:      36.7 ml RIGHT VENTRICLE RV S prime:     7.15 cm/s TAPSE (M-mode): 1.2 cm LEFT ATRIUM             Index        RIGHT ATRIUM           Index LA diam:        4.30 cm 2.19 cm/m   RA Area:     15.20 cm LA Vol (A2C):   53.1 ml 27.02 ml/m  RA Volume:   37.90 ml  19.28 ml/m LA Vol (A4C):   46.2 ml 23.50 ml/m LA Biplane Vol: 50.0 ml 25.44 ml/m  AORTIC VALVE LVOT Vmax:   83.60 cm/s LVOT Vmean:  54.100 cm/s LVOT VTI:    0.163 m  AORTA Ao Root diam: 3.70 cm Ao Asc diam:  3.40 cm MR Peak grad: 115.3 mmHg  TRICUSPID VALVE MR Mean grad: 80.0 mmHg   TR Peak grad:   43.3 mmHg MR Vmax:      537.00 cm/s TR Vmax:        329.00 cm/s MR Vmean:     435.0 cm/s  SHUNTS                           Systemic VTI:  0.16 m                           Systemic Diam: 2.00 cm Jenkins Rouge MD Electronically signed by Jenkins Rouge MD Signature Date/Time: 03/21/2021/1:57:44 PM    Final     HISTORICAL  MICRO/IMAGING  Assessment/Plan:  80yo M with CAD, T2DM, afib, lung ca, currently admitted for enterococcal sepsis complicated by STEMI/ACS.  - recommend to narrow abtx to the following regimen of ceftriaxone 2gm IV plus ampicillin, which can de-escalate after TEE - repeat blood cx tomorrow to see if bacteremia is clearing - can stop vancomycin since changing over to ampicillin plus ceftriaxone - defer to cardiology in terms of further management of ACS.  Elzie Rings [place] [facility] [phone]

## 2021-03-22 NOTE — Progress Notes (Signed)
ANTICOAGULATION CONSULT NOTE  Pharmacy Consult for Heparin Indication: chest pain/ACS and atrial fibrillation  Allergies  Allergen Reactions   Oxycodone     Patient Measurements: Height: 5\' 11"  (180.3 cm) Weight: 76.9 kg (169 lb 8.5 oz) IBW/kg (Calculated) : 75.3 Heparin Dosing Weight: 78.5 kg  Vital Signs: Temp: 97.7 F (36.5 C) (11/05 1616) Temp Source: Oral (11/05 1616) BP: 131/62 (11/05 2300) Pulse Rate: 84 (11/06 0000)  Labs: Recent Labs    03/20/21 1757 03/20/21 2004 03/20/21 2210 03/21/21 0017 03/21/21 0236 03/21/21 0418 03/21/21 1417 03/22/21 0140  HGB 11.6*  --   --   --   --  8.9*  --  8.3*  HCT 35.3*  --   --   --   --  26.5*  --  25.7*  PLT 407*  --   --   --   --  278  --  261  APTT 39*  --   --   --   --   --   --   --   LABPROT 16.6*  --   --   --   --   --   --   --   INR 1.3*  --   --   --   --   --   --   --   HEPARINUNFRC  --   --   --   --   --  <0.10* 0.19* 0.33  CREATININE 1.49*  --  1.34*  --   --  1.25*  --  1.23  TROPONINIHS 3,329*   < > 3,762* 4,413* 6,544* 8,310*  --   --    < > = values in this interval not displayed.     Estimated Creatinine Clearance: 50.2 mL/min (by C-G formula based on SCr of 1.23 mg/dL).  Assessment: 80 y.o. male with Afib/ACS for heparin  Goal of Therapy:  Heparin level 0.3-0.7 units/ml Monitor platelets by anticoagulation protocol: Yes   Plan:  Continue Heparin at current rate  Phillis Knack, PharmD, BCPS

## 2021-03-22 NOTE — Progress Notes (Signed)
ANTICOAGULATION CONSULT NOTE  Pharmacy Consult for Heparin Indication: chest pain/ACS and atrial fibrillation  Allergies  Allergen Reactions   Oxycodone     Patient Measurements: Height: 5\' 11"  (180.3 cm) Weight: 78.1 kg (172 lb 2.9 oz) IBW/kg (Calculated) : 75.3 Heparin Dosing Weight: 78.5 kg  Vital Signs: Temp: 97.4 F (36.3 C) (11/06 1149) Temp Source: Oral (11/06 1149) BP: 124/57 (11/06 1300) Pulse Rate: 77 (11/06 1200)  Labs: Recent Labs    03/20/21 1757 03/20/21 1757 03/20/21 2004 03/20/21 2210 03/21/21 0017 03/21/21 0236 03/21/21 0418 03/21/21 1417 03/22/21 0140 03/22/21 1014  HGB 11.6*  --   --   --   --   --  8.9*  --  8.3*  --   HCT 35.3*  --   --   --   --   --  26.5*  --  25.7*  --   PLT 407*  --   --   --   --   --  278  --  261  --   APTT 39*  --   --   --   --   --   --   --   --   --   LABPROT 16.6*  --   --   --   --   --   --   --   --   --   INR 1.3*  --   --   --   --   --   --   --   --   --   HEPARINUNFRC  --    < >  --   --   --   --  <0.10* 0.19* 0.33 0.49  CREATININE 1.49*  --   --  1.34*  --   --  1.25*  --  1.23  --   TROPONINIHS 3,329*  --    < > 3,762* 4,413* 6,544* 8,310*  --   --   --    < > = values in this interval not displayed.     Estimated Creatinine Clearance: 50.2 mL/min (by C-G formula based on SCr of 1.23 mg/dL).  Assessment: 80 y.o. male with Afib/ACS for heparin.  Heparin level therapeutic x 2 consecutive checks.   Goal of Therapy:  Heparin level 0.3-0.7 units/ml Monitor platelets by anticoagulation protocol: Yes   Plan:  Continue Heparin at current rate F/U HL in am  Hart Robinsons, PharmD Clinical Pharmacist

## 2021-03-22 NOTE — Progress Notes (Signed)
PROGRESS NOTE  [name] TGY:[id number] DOB: 11-Mar-1941 DOA: 03/20/2021 PCP: [name], MD  Brief History:  80 year old male with a history of stage IV lung adenocarcinoma, CKD stage III, hypertension, diabetes mellitus type 2, coronary artery disease status post CABG presenting with 2-week history of chest pressure and shortness of breath.  He states that the chest pressure was initially intermittent, but has become constant with worsening shortness of breath.  He has had increasing generalized weakness and decreased oral intake.  He has had some subjective fevers and chills.  He denies any headache, neck pain, mopped assist but complains of of a nonproductive cough. In the emergency department, the patient was afebrile and tachycardic.  Oxygen saturation was in the low 90s on 4 L nasal cannula.  Heart rate ranging from the 130s to 150s, and respirations ranging from the 20s to low 40s.  CMP notable for hyponatremia to 124, potassium 2.8, chloride 89, glucose 237, creatinine 1.49, calcium 8.1; CBC notable for white count of 19.5, remainder unremarkable.  Initial troponin was 3329, initial lactic acid was 2.9 and down trended to 2.1 on recheck.  COVID and influenza swabs were negative.  Chest x-ray was concerning for possible bilateral lower lobe opacities possibly representing infection.  Initial EKG showed some ST depressions but no clear STEMI, on repeat newly evident STEMI was apparent in leads II, III, and aVF, with reciprocal changes seen in the anterior leads. Patient was started on broad-spectrum antibiotics, heparin drip, and IV fluids.  ED provider consulted with cardiology, who agreed with STEMI diagnosis but did not recommend Cath Lab activation at this time given critical illness, however did recommend transfer to [facility] for further treatment and stabilization.  However, at lease 2 separate providers including myself have discussed with the patient regarding risk/benefits of  transfer to [facility], and the pt continues to confirm that he wishes to stay at [facility].      Assessment/Plan: Severe sepsis -Present on admission -Presented with tachycardia, hypoxia, leukocytosis, elevated lactic acid -due to bacteremia/pneumonia -Lactic acid peaked 2.9>> 1.2 -Continue cefepime and vancomycin pending culture data -Check PCT--1.08   STEMI -Personally reviewed EKG 11/4--ST elevation in lateral leads -Continue IV heparin -Continue IV nitroglycerin -Continue statin -11/5 echo EF 35-40%, septal/apical/mid basal HK, mild MR/TR -Continue aspirin -01/09/2021 echo EF 55 to 60%, no WMA, normal RV, mild MR -I have discussed again risk/benefits of transfer to [facility] and no cardiology present at [facility]--pt expressed understanding and wishes to stay at [facility]  E faecalis bacteremia -11/4 blood culture--E faecalis -repeat Blood culture -will ultimately need TEE -urine culture was not collected despite prior order -d/c cefepime/metronidazole -continue empiric vanc   Lobar pneumonia -Check PCT1.08 -Continue cefepime -MRSA negative   Acute respiratory failure with hypoxia -Stable on 3 L>>weaned to RA -Wean oxygen for saturation greater 92%   Paroxysmal Afib -continue IV heparin -had short episodes 11/4 evening and at time of admission -now back in sinus -low dose metoprolol   CKD stage IIIb -Baseline creatinine 1.2-1.5 -Monitor serially  Abdominal pain -CT abd/pelvis   Metastatic lung adenocarcinoma -Patient has been on dabrafenib and trametinib -Holding immunotherapy in the setting of sepsis -Outpatient follow-up with Dr. Delton Coombes   Diabetes mellitus type 2 -Check hemoglobin A1c -NovoLog sliding scale -Holding Amaryl and metformin -Holding Actos -start low dose semglee   Essential hypertension -Holding amlodipine -restart low dose metoprolol   Hyponatremia -Secondary to volume depletion -Judicious IV  fluids   Goals of care -Patient  confirms DNR/DNI -Advance care planning, including the explanation and discussion of advance directives was carried out with the patient and family.  Code status including explanations of "Owen Code" and "DNR" and alternatives were discussed in detail.  Discussion of end-of-life issues including but not limited palliative care, hospice care and the concept of hospice, other end-of-life care options, power of attorney for health care decisions, living wills, and physician orders for life-sustaining treatment were also discussed with the patient and family.  Total face to face time 16 minutes.               Status is: Inpatient   Remains inpatient appropriate because: severity of illness requiring IV heparin and IV abx               Family Communication:  spouse update at bedside 11/5   Consultants:  none   Code Status:   DNR   DVT Prophylaxis:  IV Heparin      Procedures: As Listed in Progress Note Above   Antibiotics: Cefepime 114>> Vanc 11/4>>     Subjective: Patient denies fevers, chills, headache, chest pain, dyspnea, nausea, vomiting, diarrhea, abdominal pain, dysuria, hematuria, hematochezia, and melena.   Objective: Vitals:   03/22/21 0100 03/22/21 0300 03/22/21 0400 03/22/21 0500  BP: 134/64 (!) 129/55 130/69 134/62  Pulse: 85 88 85 81  Resp: (!) 26 (!) 23 (!) 23 (!) 22  Temp:      TempSrc:      SpO2: 100% 98% 100% 99%  Weight:    78.1 kg  Height:        Intake/Output Summary (Last 24 hours) at 03/22/2021 0643 Last data filed at 03/22/2021 0430 Gross per 24 hour  Intake 3016.22 ml  Output 950 ml  Net 2066.22 ml   Weight change: -0.372 kg Exam:  General:  Pt is alert, follows commands appropriately, not in acute distress HEENT: No icterus, No thrush, No neck mass, [place]/AT Cardiovascular: RRR, S1/S2, no rubs, no gallops Respiratory: bibasilar rales, R>L, no wheeze Abdomen: Soft/+BS, non tender, non distended, no guarding Extremities: trace LE  edema, No lymphangitis, No petechiae, No rashes, no synovitis   Data Reviewed: I have personally reviewed following labs and imaging studies Basic Metabolic Panel: Recent Labs  Lab 03/20/21 1757 03/20/21 2210 03/21/21 0418 03/22/21 0140  NA 124* 127* 128* 128*  K 2.8* 3.3* 3.3* 3.2*  CL 89* 98 102 102  CO2 22 17* 19* 20*  GLUCOSE 237* 218* 143* 195*  BUN 15 14 13 15   CREATININE 1.49* 1.34* 1.25* 1.23  CALCIUM 8.1* 7.1* 7.0* 7.1*   Liver Function Tests: Recent Labs  Lab 03/20/21 1757 03/20/21 2210 03/22/21 0140  AST 32 27 40  ALT 14 12 14   ALKPHOS 130* 100 80  BILITOT 1.1 1.1 0.6  PROT 8.3* 6.4* 5.7*  ALBUMIN 2.6* 2.0* 1.7*   No results for input(s): LIPASE, AMYLASE in the last 168 hours. No results for input(s): AMMONIA in the last 168 hours. Coagulation Profile: Recent Labs  Lab 03/20/21 1757  INR 1.3*   CBC: Recent Labs  Lab 03/20/21 1757 03/21/21 0418 03/22/21 0140  WBC 19.5* 16.0* 9.4  NEUTROABS 16.6*  --   --   HGB 11.6* 8.9* 8.3*  HCT 35.3* 26.5* 25.7*  MCV 75.8* 75.7* 78.4*  PLT 407* 278 261   Cardiac Enzymes: No results for input(s): CKTOTAL, CKMB, CKMBINDEX, TROPONINI in the last 168 hours. BNP: Invalid input(s): POCBNP  CBG: Recent Labs  Lab 03/21/21 1120 03/21/21 1613 03/21/21 2052 03/22/21 0043 03/22/21 0506  GLUCAP 174* 197* 251* 199* 180*   HbA1C: No results for input(s): HGBA1C in the last 72 hours. Urine analysis:    Component Value Date/Time   COLORURINE YELLOW 07/23/2020 2120   APPEARANCEUR HAZY (A) 07/23/2020 2120   LABSPEC 1.017 07/23/2020 2120   PHURINE 6.0 07/23/2020 2120   GLUCOSEU >=500 (A) 07/23/2020 2120   HGBUR LARGE (A) 07/23/2020 2120   BILIRUBINUR NEGATIVE 07/23/2020 2120   KETONESUR 20 (A) 07/23/2020 2120   PROTEINUR 100 (A) 07/23/2020 2120   UROBILINOGEN 0.2 05/05/2007 1658   NITRITE NEGATIVE 07/23/2020 2120   LEUKOCYTESUR LARGE (A) 07/23/2020 2120   Sepsis  Labs: @LABRCNTIP (procalcitonin:4,lacticidven:4) ) Recent Results (from the past 240 hour(s))  Blood Culture (routine x 2)     Status: None (Preliminary result)   Collection Time: 03/20/21  5:57 PM   Specimen: BLOOD LEFT ARM  Result Value Ref Range Status   Specimen Description BLOOD LEFT ARM  Final   Special Requests   Final    BOTTLES DRAWN AEROBIC ONLY Blood Culture adequate volume   Culture   Final    NO GROWTH < 24 HOURS Performed at [facility], [address], [place], [place] [address]    Report Status PENDING  Incomplete  Blood Culture (routine x 2)     Status: None (Preliminary result)   Collection Time: 03/20/21  5:57 PM   Specimen: Right Antecubital; Blood  Result Value Ref Range Status   Specimen Description   Final    RIGHT ANTECUBITAL Performed at [facility], [address] [place], [place] [address]    Special Requests   Final    BOTTLES DRAWN AEROBIC AND ANAEROBIC Blood Culture results may not be optimal due to an inadequate volume of blood received in culture bottles Performed at [facility], [address] [place], [place] [address]    Culture  Setup Time   Final    ANAEROBIC BOTTLE ONLY GRAM POSITIVE COCCI CRITICAL RESULT CALLED TO, READ BACK BY AND VERIFIED WITH: MILLS,M 1629 03/21/2021 COLEMAN,R Organism ID to follow AEROBIC BOTTLE GRAM POSITIVE COCCI RESULTS PREVIOUSLY CALLED CRITICAL RESULT CALLED TO, READ BACK BY AND VERIFIED WITH: RN T.ALLEN ON [id number] AT 2023 BY [place] Performed at [facility], [place] [address], [place],  [address]    Culture GRAM POSITIVE COCCI  Final   Report Status PENDING  Incomplete  Blood Culture ID Panel (Reflexed)     Status: Abnormal   Collection Time: 03/20/21  5:57 PM  Result Value Ref Range Status   Enterococcus faecalis DETECTED (A) NOT DETECTED Final    Comment: CRITICAL RESULT CALLED TO, READ BACK BY AND VERIFIED WITH: RN T.ALLEN ON [id number] AT 2023 BY E.PARRISH    Enterococcus Faecium NOT  DETECTED NOT DETECTED Final   Listeria monocytogenes NOT DETECTED NOT DETECTED Final   Staphylococcus species NOT DETECTED NOT DETECTED Final   Staphylococcus aureus (BCID) NOT DETECTED NOT DETECTED Final   Staphylococcus epidermidis NOT DETECTED NOT DETECTED Final   Staphylococcus lugdunensis NOT DETECTED NOT DETECTED Final   Streptococcus species NOT DETECTED NOT DETECTED Final   Streptococcus agalactiae NOT DETECTED NOT DETECTED Final   Streptococcus pneumoniae NOT DETECTED NOT DETECTED Final   Streptococcus pyogenes NOT DETECTED NOT DETECTED Final   A.calcoaceticus-baumannii NOT DETECTED NOT DETECTED Final   Bacteroides fragilis NOT DETECTED NOT DETECTED Final   Enterobacterales NOT DETECTED NOT DETECTED Final  Enterobacter cloacae complex NOT DETECTED NOT DETECTED Final   Escherichia coli NOT DETECTED NOT DETECTED Final   Klebsiella aerogenes NOT DETECTED NOT DETECTED Final   Klebsiella oxytoca NOT DETECTED NOT DETECTED Final   Klebsiella pneumoniae NOT DETECTED NOT DETECTED Final   Proteus species NOT DETECTED NOT DETECTED Final   Salmonella species NOT DETECTED NOT DETECTED Final   Serratia marcescens NOT DETECTED NOT DETECTED Final   Haemophilus influenzae NOT DETECTED NOT DETECTED Final   Neisseria meningitidis NOT DETECTED NOT DETECTED Final   Pseudomonas aeruginosa NOT DETECTED NOT DETECTED Final   Stenotrophomonas maltophilia NOT DETECTED NOT DETECTED Final   Candida albicans NOT DETECTED NOT DETECTED Final   Candida auris NOT DETECTED NOT DETECTED Final   Candida glabrata NOT DETECTED NOT DETECTED Final   Candida krusei NOT DETECTED NOT DETECTED Final   Candida parapsilosis NOT DETECTED NOT DETECTED Final   Candida tropicalis NOT DETECTED NOT DETECTED Final   Cryptococcus neoformans/gattii NOT DETECTED NOT DETECTED Final   Vancomycin resistance NOT DETECTED NOT DETECTED Final    Comment: Performed at [facility], [place] [address], [place], [place] [address]  Resp  Panel by RT-PCR (Flu A&B, Covid) Nasopharyngeal Swab     Status: None   Collection Time: 03/20/21  6:25 PM   Specimen: Nasopharyngeal Swab; Nasopharyngeal(NP) swabs in vial transport medium  Result Value Ref Range Status   SARS Coronavirus 2 by RT PCR NEGATIVE NEGATIVE Final    Comment: (NOTE) SARS-CoV-2 target nucleic acids are NOT DETECTED.  The SARS-CoV-2 RNA is generally detectable in upper respiratory specimens during the acute phase of infection. The lowest concentration of SARS-CoV-2 viral copies this assay can detect is 138 copies/mL. A negative result does not preclude SARS-Cov-2 infection and should not be used as the sole basis for treatment or other patient management decisions. A negative result may occur with  improper specimen collection/handling, submission of specimen other than nasopharyngeal swab, presence of viral mutation(s) within the areas targeted by this assay, and inadequate number of viral copies(<138 copies/mL). A negative result must be combined with clinical observations, patient history, and epidemiological information. The expected result is Negative.  Fact Sheet for Patients:  EntrepreneurPulse.com.au  Fact Sheet for Healthcare Providers:  IncredibleEmployment.be  This test is no t yet approved or cleared by the [place] FDA and  has been authorized for detection and/or diagnosis of SARS-CoV-2 by FDA under an Emergency Use Authorization (EUA). This EUA will remain  in effect (meaning this test can be used) for the duration of the COVID-19 declaration under Section 564(b)(1) of the Act, 21 U.S.C.section 360bbb-3(b)(1), unless the authorization is terminated  or revoked sooner.       Influenza A by PCR NEGATIVE NEGATIVE Final   Influenza B by PCR NEGATIVE NEGATIVE Final    Comment: (NOTE) The Xpert Xpress SARS-CoV-2/FLU/RSV plus assay is intended as an aid in the diagnosis of influenza from Nasopharyngeal  swab specimens and should not be used as a sole basis for treatment. Nasal washings and aspirates are unacceptable for Xpert Xpress SARS-CoV-2/FLU/RSV testing.  Fact Sheet for Patients: EntrepreneurPulse.com.au  Fact Sheet for Healthcare Providers: IncredibleEmployment.be  This test is not yet approved or cleared by the [place] FDA and has been authorized for detection and/or diagnosis of SARS-CoV-2 by FDA under an Emergency Use Authorization (EUA). This EUA will remain in effect (meaning this test can be used) for the duration of the COVID-19 declaration under Section 564(b)(1) of the Act, 21 U.S.C. section  360bbb-3(b)(1), unless the authorization is terminated or revoked.  Performed at [facility], [address] [place], [place] [address]   MRSA Next Gen by PCR, Nasal     Status: None   Collection Time: 03/20/21 11:37 PM   Specimen: Nasal Mucosa; Nasal Swab  Result Value Ref Range Status   MRSA by PCR Next Gen NOT DETECTED NOT DETECTED Final    Comment: (NOTE) The GeneXpert MRSA Assay (FDA approved for NASAL specimens only), is one component of a comprehensive MRSA colonization surveillance program. It is not intended to diagnose MRSA infection nor to guide or monitor treatment for MRSA infections. Test performance is not FDA approved in patients less than 15 years old. Performed at [facility], [address], [place], [place] [address]      Scheduled Meds:  aspirin EC  81 mg Oral Daily   atorvastatin  80 mg Oral Daily   budesonide (PULMICORT) nebulizer solution  0.5 mg Nebulization BID   Chlorhexidine Gluconate Cloth  6 each Topical Daily   escitalopram  10 mg Oral Daily   insulin aspart  0-6 Units Subcutaneous Q4H   ipratropium  0.5 mg Nebulization Q6H   levalbuterol  0.63 mg Nebulization Q6H   metoprolol tartrate  12.5 mg Oral BID   potassium chloride  20 mEq Oral Once   tamsulosin  0.4 mg Oral QPC supper   Continuous  Infusions:  0.9 % NaCl with KCl 20 mEq / L 50 mL/hr at 03/22/21 0430   ceFEPime (MAXIPIME) IV Stopped (03/21/21 2257)   heparin 1,500 Units/hr (03/22/21 0430)   nitroGLYCERIN 5 mcg/min (03/22/21 0430)   vancomycin Stopped (03/22/21 0154)    Procedures/Studies: DG Chest Port 1 View  Result Date: 03/20/2021 CLINICAL DATA:  Questionable sepsis. EXAM: PORTABLE CHEST 1 VIEW COMPARISON:  Chest x-ray 12/27/2017. FINDINGS: Patient is status post cardiac surgery. There are atherosclerotic calcifications of the aorta. There are new reticulonodular opacities in both lower lungs, right greater than left. Costophrenic angles are clear. There is no pneumothorax. No acute fractures are seen. IMPRESSION: 1. Reticulonodular opacities in the bilateral lung bases, right greater than left. Findings may be related to increasing pulmonary nodules and/or superimposed infection. Electronically Signed   By: Ronney Asters M.D.   On: 03/20/2021 18:38   ECHOCARDIOGRAM COMPLETE  Result Date: 03/21/2021    ECHOCARDIOGRAM REPORT   Patient Name:   [name] Date of Exam: 03/21/2021 Medical Rec #:  [id number]     Height:       71.0 in Accession #:    [id number]    Weight:       169.5 lb Date of Birth:  11/17/1940     BSA:          1.966 m Patient Age:    39 years      BP:           1417/63 mmHg Patient Gender: M             HR:           67 bpm. Exam Location:  [facility] Procedure: 2D Echo, Cardiac Doppler and Color Doppler Indications:    STEMI (ST elevation myocardial infarction) [facility]) [[id number]]  History:        Patient has prior history of Echocardiogram examinations, most                 recent 01/09/2021. CAD; Risk Factors:Diabetes and Hypertension.  Sonographer:    Bernadene Person RDCS Referring Phys:  [id number] UKGURKY M ECKSTAT IMPRESSIONS  1. Distal septal, apical mid and basal inferior wall hypokinesis . Left ventricular ejection fraction, by estimation, is 35 to 40%. The left ventricle has moderately decreased function. The  left ventricle demonstrates regional wall motion abnormalities (see scoring diagram/findings for description). Left ventricular diastolic parameters were normal.  2. Right ventricular systolic function is normal. The right ventricular size is normal. There is moderately elevated pulmonary artery systolic pressure.  3. Left atrial size was moderately dilated.  4. 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 The mitral valve is abnormal. Mild mitral valve regurgitation. No evidence of mitral stenosis.  5. The aortic valve is tricuspid. There is mild calcification of the aortic valve. Aortic valve regurgitation is not visualized. Mild aortic valve sclerosis is present, with no evidence of aortic valve stenosis.  6. The inferior vena cava is normal in size with greater than 50% respiratory variability, suggesting right atrial pressure of 3 mmHg. FINDINGS  Left Ventricle: Distal septal, apical mid and basal inferior wall hypokinesis. Left ventricular ejection fraction, by estimation, is 35 to 40%. The left ventricle has moderately decreased function. The left ventricle demonstrates regional wall motion abnormalities. The left ventricular internal cavity size was normal in size. There is no left ventricular hypertrophy. Left ventricular diastolic parameters were normal. Right Ventricle: The right ventricular size is normal. No increase in right ventricular wall thickness. Right ventricular systolic function is normal. There is moderately elevated pulmonary artery systolic pressure. The tricuspid regurgitant velocity is 3.29 m/s, and with an assumed right atrial pressure of 8 mmHg, the estimated right ventricular systolic pressure is 70.6 mmHg. Left Atrium: Left atrial size was moderately dilated. Right Atrium: Right atrial size was normal in size. Pericardium: There is no evidence of pericardial effusion. 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 The mitral valve is abnormal. There is mild thickening of the mitral valve leaflet(s). There is mild  calcification of the mitral valve leaflet(s). Mild mitral valve regurgitation. No evidence of mitral valve stenosis. Tricuspid Valve: The tricuspid valve is normal in structure. Tricuspid valve regurgitation is mild . No evidence of tricuspid stenosis. Aortic Valve: The aortic valve is tricuspid. There is mild calcification of the aortic valve. Aortic valve regurgitation is not visualized. Mild aortic valve sclerosis is present, with no evidence of aortic valve stenosis. Pulmonic Valve: The pulmonic valve was normal in structure. Pulmonic valve regurgitation is trivial. No evidence of pulmonic stenosis. Aorta: The aortic root is normal in size and structure. Venous: The inferior vena cava is normal in size with greater than 50% respiratory variability, suggesting right atrial pressure of 3 mmHg. IAS/Shunts: The interatrial septum was not assessed.  LEFT VENTRICLE PLAX 2D LVIDd:         5.40 cm LVIDs:         3.80 cm LV PW:         1.30 cm LV IVS:        0.80 cm LVOT diam:     2.00 cm LV SV:         51 LV SV Index:   26 LVOT Area:     3.14 cm  LV Volumes (MOD) LV vol d, MOD A2C: 110.0 ml LV vol d, MOD A4C: 97.8 ml LV vol s, MOD A2C: 76.2 ml LV vol s, MOD A4C: 62.3 ml LV SV MOD A2C:     33.8 ml LV SV MOD A4C:     97.8 ml LV SV MOD BP:      36.7 ml RIGHT VENTRICLE  RV S prime:     7.15 cm/s TAPSE (M-mode): 1.2 cm LEFT ATRIUM             Index        RIGHT ATRIUM           Index LA diam:        4.30 cm 2.19 cm/m   RA Area:     15.20 cm LA Vol (A2C):   53.1 ml 27.02 ml/m  RA Volume:   37.90 ml  19.28 ml/m LA Vol (A4C):   46.2 ml 23.50 ml/m LA Biplane Vol: 50.0 ml 25.44 ml/m  AORTIC VALVE LVOT Vmax:   83.60 cm/s LVOT Vmean:  54.100 cm/s LVOT VTI:    0.163 m  AORTA Ao Root diam: 3.70 cm Ao Asc diam:  3.40 cm MR Peak grad: 115.3 mmHg  TRICUSPID VALVE MR Mean grad: 80.0 mmHg   TR Peak grad:   43.3 mmHg MR Vmax:      537.00 cm/s TR Vmax:        329.00 cm/s MR Vmean:     435.0 cm/s                           SHUNTS                            Systemic VTI:  0.16 m                           Systemic Diam: 2.00 cm Jenkins Rouge MD Electronically signed by Jenkins Rouge MD Signature Date/Time: 03/21/2021/1:57:44 PM    Final     Orson Eva, DO  [facility]  If 7PM-7AM, please contact night-coverage [email or web address] Password [facility] 03/22/2021, 6:43 AM   LOS: 2 days

## 2021-03-23 ENCOUNTER — Encounter (HOSPITAL_COMMUNITY): Payer: Self-pay | Admitting: Primary Care

## 2021-03-23 DIAGNOSIS — A4181 Sepsis due to Enterococcus: Principal | ICD-10-CM

## 2021-03-23 DIAGNOSIS — C3490 Malignant neoplasm of unspecified part of unspecified bronchus or lung: Secondary | ICD-10-CM | POA: Diagnosis not present

## 2021-03-23 DIAGNOSIS — Z7189 Other specified counseling: Secondary | ICD-10-CM

## 2021-03-23 DIAGNOSIS — Z951 Presence of aortocoronary bypass graft: Secondary | ICD-10-CM

## 2021-03-23 DIAGNOSIS — I4891 Unspecified atrial fibrillation: Secondary | ICD-10-CM | POA: Diagnosis not present

## 2021-03-23 DIAGNOSIS — E119 Type 2 diabetes mellitus without complications: Secondary | ICD-10-CM

## 2021-03-23 DIAGNOSIS — N1832 Chronic kidney disease, stage 3b: Secondary | ICD-10-CM | POA: Diagnosis not present

## 2021-03-23 DIAGNOSIS — R7881 Bacteremia: Secondary | ICD-10-CM | POA: Diagnosis not present

## 2021-03-23 DIAGNOSIS — E871 Hypo-osmolality and hyponatremia: Secondary | ICD-10-CM | POA: Diagnosis not present

## 2021-03-23 DIAGNOSIS — Z515 Encounter for palliative care: Secondary | ICD-10-CM

## 2021-03-23 DIAGNOSIS — I213 ST elevation (STEMI) myocardial infarction of unspecified site: Secondary | ICD-10-CM | POA: Diagnosis not present

## 2021-03-23 LAB — GLUCOSE, CAPILLARY
Glucose-Capillary: 198 mg/dL — ABNORMAL HIGH (ref 70–99)
Glucose-Capillary: 224 mg/dL — ABNORMAL HIGH (ref 70–99)
Glucose-Capillary: 173 mg/dL — ABNORMAL HIGH (ref 70–99)
Glucose-Capillary: 147 mg/dL — ABNORMAL HIGH (ref 70–99)

## 2021-03-23 LAB — CBC
HCT: 26.3 % — ABNORMAL LOW (ref 39.0–52.0)
Hemoglobin: 8.4 g/dL — ABNORMAL LOW (ref 13.0–17.0)
MCH: 25.1 pg — ABNORMAL LOW (ref 26.0–34.0)
MCHC: 31.9 g/dL (ref 30.0–36.0)
MCV: 78.5 fL — ABNORMAL LOW (ref 80.0–100.0)
Platelets: 253 10*3/uL (ref 150–400)
RBC: 3.35 MIL/uL — ABNORMAL LOW (ref 4.22–5.81)
RDW: 15.5 % (ref 11.5–15.5)
WBC: 8.7 10*3/uL (ref 4.0–10.5)
nRBC: 0 % (ref 0.0–0.2)

## 2021-03-23 LAB — COMPREHENSIVE METABOLIC PANEL
ALT: 11 U/L (ref 0–44)
AST: 21 U/L (ref 15–41)
Albumin: 1.6 g/dL — ABNORMAL LOW (ref 3.5–5.0)
Alkaline Phosphatase: 74 U/L (ref 38–126)
Anion gap: 5 (ref 5–15)
BUN: 11 mg/dL (ref 8–23)
CO2: 19 mmol/L — ABNORMAL LOW (ref 22–32)
Calcium: 6.9 mg/dL — ABNORMAL LOW (ref 8.9–10.3)
Chloride: 103 mmol/L (ref 98–111)
Creatinine, Ser: 1.2 mg/dL (ref 0.61–1.24)
GFR, Estimated: >60 mL/min (ref >60)
Glucose, Bld: 200 mg/dL — ABNORMAL HIGH (ref 70–99)
Potassium: 3.8 mmol/L (ref 3.5–5.1)
Sodium: 127 mmol/L — ABNORMAL LOW (ref 135–145)
Total Bilirubin: 0.6 mg/dL (ref 0.3–1.2)
Total Protein: 5.7 g/dL — ABNORMAL LOW (ref 6.5–8.1)

## 2021-03-23 LAB — URINE CULTURE

## 2021-03-23 LAB — HEMOGLOBIN A1C
Hgb A1c MFr Bld: 7.2 % — ABNORMAL HIGH (ref 4.8–5.6)
Hgb A1c MFr Bld: 7.3 % — ABNORMAL HIGH (ref 4.8–5.6)
Mean Plasma Glucose: 160 mg/dL
Mean Plasma Glucose: 163 mg/dL

## 2021-03-23 LAB — CULTURE, BLOOD (ROUTINE X 2)

## 2021-03-23 LAB — MAGNESIUM: Magnesium: 1.8 mg/dL (ref 1.7–2.4)

## 2021-03-23 LAB — HEPARIN LEVEL (UNFRACTIONATED): Heparin Unfractionated: 0.46 IU/mL (ref 0.30–0.70)

## 2021-03-23 LAB — PHOSPHORUS: Phosphorus: 2.1 mg/dL — ABNORMAL LOW (ref 2.5–4.6)

## 2021-03-23 MED ORDER — MORPHINE SULFATE (PF) 2 MG/ML IV SOLN
INTRAVENOUS | Status: AC
  Administered 2021-03-23: 2 mg via INTRAVENOUS
  Filled 2021-03-23: qty 1

## 2021-03-23 MED ORDER — LEVALBUTEROL HCL 0.63 MG/3ML IN NEBU
0.6300 mg | INHALATION_SOLUTION | RESPIRATORY_TRACT | Status: DC | PRN
  Administered 2021-03-24 – 2021-03-27 (×3): 0.63 mg via RESPIRATORY_TRACT
  Filled 2021-03-23 (×3): qty 3

## 2021-03-23 MED ORDER — SODIUM CHLORIDE 0.9 % IV SOLN
2.0000 g | Freq: Two times a day (BID) | INTRAVENOUS | Status: AC
  Administered 2021-03-23 – 2021-03-28 (×11): 2 g via INTRAVENOUS
  Filled 2021-03-23 (×11): qty 20

## 2021-03-23 MED ORDER — CLOPIDOGREL BISULFATE 75 MG PO TABS
75.0000 mg | ORAL_TABLET | Freq: Every day | ORAL | Status: DC
  Administered 2021-03-24 – 2021-03-30 (×7): 75 mg via ORAL
  Filled 2021-03-23 (×7): qty 1

## 2021-03-23 MED ORDER — LEVALBUTEROL HCL 0.63 MG/3ML IN NEBU
INHALATION_SOLUTION | RESPIRATORY_TRACT | Status: AC
  Filled 2021-03-23: qty 3

## 2021-03-23 MED ORDER — MORPHINE SULFATE (PF) 2 MG/ML IV SOLN: 2.0000 mg | Freq: Once | INTRAVENOUS | Status: AC

## 2021-03-23 MED ORDER — RANOLAZINE ER 500 MG PO TB12
500.0000 mg | ORAL_TABLET | Freq: Two times a day (BID) | ORAL | Status: DC
  Administered 2021-03-23 – 2021-03-30 (×15): 500 mg via ORAL
  Filled 2021-03-23 (×16): qty 1

## 2021-03-23 MED ORDER — CLOPIDOGREL BISULFATE 75 MG PO TABS
300.0000 mg | ORAL_TABLET | Freq: Once | ORAL | Status: AC
  Administered 2021-03-23: 300 mg via ORAL
  Filled 2021-03-23: qty 4

## 2021-03-23 MED ORDER — SODIUM CHLORIDE 0.9 % IV SOLN
2.0000 g | Freq: Four times a day (QID) | INTRAVENOUS | Status: AC
  Administered 2021-03-23 – 2021-03-28 (×22): 2 g via INTRAVENOUS
  Filled 2021-03-23 (×30): qty 2000

## 2021-03-23 MED ORDER — K PHOS MONO-SOD PHOS DI & MONO 155-852-130 MG PO TABS
500.0000 mg | ORAL_TABLET | Freq: Two times a day (BID) | ORAL | Status: DC
  Administered 2021-03-23 – 2021-03-30 (×15): 500 mg via ORAL
  Filled 2021-03-23 (×15): qty 2

## 2021-03-23 NOTE — Progress Notes (Signed)
PROGRESS NOTE  [name]:[id number] DOB: 1940/12/31 DOA: 03/20/2021 PCP: [name], MD   Brief History:  80 year old male with a history of stage IV lung adenocarcinoma, CKD stage III, hypertension, diabetes mellitus type 2, coronary artery disease status post CABG presenting with 2-week history of chest pressure and shortness of breath.  He states that the chest pressure was initially intermittent, but has become constant with worsening shortness of breath.  He has had increasing generalized weakness and decreased oral intake.  He has had some subjective fevers and chills.  He denies any headache, neck pain, mopped assist but complains of of a nonproductive cough. In the emergency department, the patient was afebrile and tachycardic.  Oxygen saturation was in the low 90s on 4 L nasal cannula.  Heart rate ranging from the 130s to 150s, and respirations ranging from the 20s to low 40s.  CMP notable for hyponatremia to 124, potassium 2.8, chloride 89, glucose 237, creatinine 1.49, calcium 8.1; CBC notable for white count of 19.5, remainder unremarkable.  Initial troponin was 3329, initial lactic acid was 2.9 and down trended to 2.1 on recheck.  COVID and influenza swabs were negative.  Chest x-ray was concerning for possible bilateral lower lobe opacities possibly representing infection.  Initial EKG showed some ST depressions but no clear STEMI, on repeat newly evident STEMI was apparent in leads II, III, and aVF, with reciprocal changes seen in the anterior leads. Patient was started on broad-spectrum antibiotics, heparin drip, and IV fluids.  ED provider consulted with cardiology, who agreed with STEMI diagnosis but did not recommend Cath Lab activation at this time given critical illness, however did recommend transfer to [facility] for further treatment and stabilization.  However, at lease 2 separate providers including myself have discussed with the patient regarding risk/benefits of  transfer to [facility], and the pt continues to confirm that he wishes to stay at [facility].      Assessment/Plan: Severe sepsis -Present on admission -Presented with tachycardia, hypoxia, leukocytosis, elevated lactic acid -due to bacteremia/pneumonia -Lactic acid peaked 2.9>> 1.2 -Continue cefepime and vancomycin pending culture data -Check PCT--1.08   STEMI -Personally reviewed EKG 11/4--ST elevation in lateral leads -Continue IV heparin -Continue IV nitroglycerin -Continue statin -11/5 echo EF 35-40%, septal/apical/mid basal HK, mild MR/TR -Continue aspirin -01/09/2021 echo EF 55 to 60%, no WMA, normal RV, mild MR -appreciate cardiology consult   E faecalis bacteremia -11/4 blood culture--E faecalis -repeat Blood culture--neg to date -will ultimately need TEE -urine culture was not collected despite prior order -d/c cefepime/metronidazole -continue empiric vanc>>ampicillin   Lobar pneumonia -Check PCT1.08 -Continue cefepime>>ampicillin -MRSA negative   Acute respiratory failure with hypoxia -Stable on 3 L>>weaned to 2L -Wean oxygen for saturation greater 92%   Paroxysmal Afib -continue IV heparin -CHADSVASc = 6 -had short episodes 11/4 evening and at time of admission -now back in sinus -low dose metoprolol   CKD stage IIIb -Baseline creatinine 1.2-1.5 -Monitor serially   Abdominal pain -CT abd/pelvis--massive distended urinary bladder with prominence of the right ureter and collecting system; bibasilar infiltrates with small bilateral pleural effusions, R>left; no bowel inflammation or obstruction -Place Foley catheter -Advance diet   Metastatic lung adenocarcinoma -Patient has been on dabrafenib and trametinib -Holding immunotherapy in the setting of sepsis -Outpatient follow-up with Dr. Delton Coombes   Diabetes mellitus type 2 -11/6--hemoglobin A1c--7.3 -NovoLog sliding scale -Holding Amaryl and metformin -Holding Actos -start low dose semglee  Essential hypertension -Holding amlodipine -restart low dose metoprolol   Hyponatremia -Secondary to volume depletion initially -Judicious IV fluids>>saline lock  Hypophosphatemia -replete   Goals of care -Patient confirms DNR/DNI -Advance care planning, including the explanation and discussion of advance directives was carried out with the patient and family.  Code status including explanations of "Full Code" and "DNR" and alternatives were discussed in detail.  Discussion of end-of-life issues including but not limited palliative care, hospice care and the concept of hospice, other end-of-life care options, power of attorney for health care decisions, living wills, and physician orders for life-sustaining treatment were also discussed with the patient and family.  Total face to face time 16 minutes.               Status is: Inpatient   Remains inpatient appropriate because: severity of illness requiring IV heparin and IV abx               Family Communication:  spouse update at bedside 11/7   Consultants:  none   Code Status:   DNR   DVT Prophylaxis:  IV Heparin      Procedures: As Listed in Progress Note Above   Antibiotics: Cefepime 114>>11/6 Vanc 11/4>>11/6 Ampicillin 11/6>>     Subjective: Patient denies fevers, chills, headache, chest pain, nausea, vomiting, diarrhea, abdominal pain, dysuria, hematuria, hematochezia, and melena.   Objective: Vitals:   03/23/21 1000 03/23/21 1030 03/23/21 1100 03/23/21 1206  BP: (!) 96/58 114/69 (!) 121/55   Pulse: 85 81 72   Resp: (!) 24 (!) 23 20   Temp:    (!) 97.2 F (36.2 C)  TempSrc:    Axillary  SpO2: 100% 100% 100%   Weight:      Height:        Intake/Output Summary (Last 24 hours) at 03/23/2021 1211 Last data filed at 03/23/2021 0912 [place] per 24 hour  Intake 2294.66 ml  Output 1175 ml  Net 1119.66 ml   Weight change: 4.5 kg Exam:  General:  Pt is alert, follows commands appropriately, not  in acute distress HEENT: No icterus, No thrush, No neck mass, [place]/AT Cardiovascular: RRR, S1/S2, no rubs, no gallops Respiratory: bibasilar rales.  No wheeze Abdomen: Soft/+BS, non tender, non distended, no guarding Extremities: trace LE edema, No lymphangitis, No petechiae, No rashes, no synovitis   Data Reviewed: I have personally reviewed following labs and imaging studies Basic Metabolic Panel: Recent Labs  Lab 03/20/21 1757 03/20/21 2210 03/21/21 0418 03/22/21 0140 03/23/21 0458  NA 124* 127* 128* 128* 127*  K 2.8* 3.3* 3.3* 3.2* 3.8  CL 89* 98 102 102 103  CO2 22 17* 19* 20* 19*  GLUCOSE 237* 218* 143* 195* 200*  BUN 15 14 13 15 11   CREATININE 1.49* 1.34* 1.25* 1.23 1.20  CALCIUM 8.1* 7.1* 7.0* 7.1* 6.9*  MG  --   --   --   --  1.8  PHOS  --   --   --   --  2.1*   Liver Function Tests: Recent Labs  Lab 03/20/21 1757 03/20/21 2210 03/22/21 0140 03/23/21 0458  AST 32 27 40 21  ALT 14 12 14 11   ALKPHOS 130* 100 80 74  BILITOT 1.1 1.1 0.6 0.6  PROT 8.3* 6.4* 5.7* 5.7*  ALBUMIN 2.6* 2.0* 1.7* 1.6*   No results for input(s): LIPASE, AMYLASE in the last 168 hours. No results for input(s): AMMONIA in the last 168 hours. Coagulation Profile: Recent Labs  Lab 03/20/21  1757  INR 1.3*   CBC: Recent Labs  Lab 03/20/21 1757 03/21/21 0418 03/22/21 0140 03/23/21 0458  WBC 19.5* 16.0* 9.4 8.7  NEUTROABS 16.6*  --   --   --   HGB 11.6* 8.9* 8.3* 8.4*  HCT 35.3* 26.5* 25.7* 26.3*  MCV 75.8* 75.7* 78.4* 78.5*  PLT 407* 278 261 253   Cardiac Enzymes: No results for input(s): CKTOTAL, CKMB, CKMBINDEX, TROPONINI in the last 168 hours. BNP: Invalid input(s): POCBNP CBG: Recent Labs  Lab 03/22/21 1148 03/22/21 1636 03/22/21 2153 03/23/21 0720 03/23/21 1152  GLUCAP 205* 208* 203* 198* 224*   HbA1C: Recent Labs    03/21/21 0509 03/22/21 0213  HGBA1C 7.2* 7.3*   Urine analysis:    Component Value Date/Time   COLORURINE RED (A) 03/22/2021 0827    APPEARANCEUR CLOUDY (A) 03/22/2021 0827   LABSPEC  03/22/2021 0827    TEST NOT REPORTED DUE TO COLOR INTERFERENCE OF URINE PIGMENT   PHURINE  03/22/2021 0827    TEST NOT REPORTED DUE TO COLOR INTERFERENCE OF URINE PIGMENT   GLUCOSEU (A) 03/22/2021 0827    TEST NOT REPORTED DUE TO COLOR INTERFERENCE OF URINE PIGMENT   HGBUR (A) 03/22/2021 0827    TEST NOT REPORTED DUE TO COLOR INTERFERENCE OF URINE PIGMENT   BILIRUBINUR (A) 03/22/2021 0827    TEST NOT REPORTED DUE TO COLOR INTERFERENCE OF URINE PIGMENT   KETONESUR (A) 03/22/2021 0827    TEST NOT REPORTED DUE TO COLOR INTERFERENCE OF URINE PIGMENT   PROTEINUR (A) 03/22/2021 0827    TEST NOT REPORTED DUE TO COLOR INTERFERENCE OF URINE PIGMENT   UROBILINOGEN 0.2 05/05/2007 1658   NITRITE (A) 03/22/2021 0827    TEST NOT REPORTED DUE TO COLOR INTERFERENCE OF URINE PIGMENT   LEUKOCYTESUR (A) 03/22/2021 0827    TEST NOT REPORTED DUE TO COLOR INTERFERENCE OF URINE PIGMENT   Sepsis Labs: @LABRCNTIP (procalcitonin:4,lacticidven:4) ) Recent Results (from the past 240 hour(s))  Blood Culture (routine x 2)     Status: None (Preliminary result)   Collection Time: 03/20/21  5:57 PM   Specimen: BLOOD LEFT ARM  Result Value Ref Range Status   Specimen Description BLOOD LEFT ARM  Final   Special Requests   Final    BOTTLES DRAWN AEROBIC ONLY Blood Culture adequate volume   Culture   Final    NO GROWTH 3 DAYS Performed at [facility], [address], [place], [place] [address]    Report Status PENDING  Incomplete  Blood Culture (routine x 2)     Status: Abnormal   Collection Time: 03/20/21  5:57 PM   Specimen: Right Antecubital; Blood  Result Value Ref Range Status   Specimen Description   Final    RIGHT ANTECUBITAL Performed at [facility], [address], [place], [place] [address]    Special Requests   Final    BOTTLES DRAWN AEROBIC AND ANAEROBIC Blood Culture results may not be optimal due to an inadequate volume of blood received in  culture bottles Performed at [facility], [address], [place], [place] [address]    Culture  Setup Time   Final    IN BOTH AEROBIC AND ANAEROBIC BOTTLES GRAM POSITIVE COCCI CRITICAL RESULT CALLED TO, READ BACK BY AND VERIFIED WITH: MILLS,M 1629 03/21/2021 COLEMAN,R CRITICAL RESULT CALLED TO, READ BACK BY AND VERIFIED WITH: RN T.ALLEN ON [id number] AT 2023 BY E.PARRISH Performed at [facility], [place] [address], [place], [place] [address]    Culture  ENTEROCOCCUS FAECALIS (A)  Final   Report Status 03/23/2021 FINAL  Final   Organism ID, Bacteria ENTEROCOCCUS FAECALIS  Final      Susceptibility   Enterococcus faecalis - MIC*    AMPICILLIN <=2 SENSITIVE Sensitive     VANCOMYCIN <=0.5 SENSITIVE Sensitive     GENTAMICIN SYNERGY SENSITIVE Sensitive     * ENTEROCOCCUS FAECALIS  Blood Culture ID Panel (Reflexed)     Status: Abnormal   Collection Time: 03/20/21  5:57 PM  Result Value Ref Range Status   Enterococcus faecalis DETECTED (A) NOT DETECTED Final    Comment: CRITICAL RESULT CALLED TO, READ BACK BY AND VERIFIED WITH: RN T.ALLEN ON [id number] AT 2023 BY E.PARRISH    Enterococcus Faecium NOT DETECTED NOT DETECTED Final   Listeria monocytogenes NOT DETECTED NOT DETECTED Final   Staphylococcus species NOT DETECTED NOT DETECTED Final   Staphylococcus aureus (BCID) NOT DETECTED NOT DETECTED Final   Staphylococcus epidermidis NOT DETECTED NOT DETECTED Final   Staphylococcus lugdunensis NOT DETECTED NOT DETECTED Final   Streptococcus species NOT DETECTED NOT DETECTED Final   Streptococcus agalactiae NOT DETECTED NOT DETECTED Final   Streptococcus pneumoniae NOT DETECTED NOT DETECTED Final   Streptococcus pyogenes NOT DETECTED NOT DETECTED Final   A.calcoaceticus-baumannii NOT DETECTED NOT DETECTED Final   Bacteroides fragilis NOT DETECTED NOT DETECTED Final   Enterobacterales NOT DETECTED NOT DETECTED Final   Enterobacter cloacae complex NOT DETECTED NOT DETECTED Final   Escherichia  coli NOT DETECTED NOT DETECTED Final   Klebsiella aerogenes NOT DETECTED NOT DETECTED Final   Klebsiella oxytoca NOT DETECTED NOT DETECTED Final   Klebsiella pneumoniae NOT DETECTED NOT DETECTED Final   Proteus species NOT DETECTED NOT DETECTED Final   Salmonella species NOT DETECTED NOT DETECTED Final   Serratia marcescens NOT DETECTED NOT DETECTED Final   Haemophilus influenzae NOT DETECTED NOT DETECTED Final   Neisseria meningitidis NOT DETECTED NOT DETECTED Final   Pseudomonas aeruginosa NOT DETECTED NOT DETECTED Final   Stenotrophomonas maltophilia NOT DETECTED NOT DETECTED Final   Candida albicans NOT DETECTED NOT DETECTED Final   Candida auris NOT DETECTED NOT DETECTED Final   Candida glabrata NOT DETECTED NOT DETECTED Final   Candida krusei NOT DETECTED NOT DETECTED Final   Candida parapsilosis NOT DETECTED NOT DETECTED Final   Candida tropicalis NOT DETECTED NOT DETECTED Final   Cryptococcus neoformans/gattii NOT DETECTED NOT DETECTED Final   Vancomycin resistance NOT DETECTED NOT DETECTED Final    Comment: Performed at [facility], [address], [place], [place] [address]  Resp Panel by RT-PCR (Flu A&B, Covid) Nasopharyngeal Swab     Status: None   Collection Time: 03/20/21  6:25 PM   Specimen: Nasopharyngeal Swab; Nasopharyngeal(NP) swabs in vial transport medium  Result Value Ref Range Status   SARS Coronavirus 2 by RT PCR NEGATIVE NEGATIVE Final    Comment: (NOTE) SARS-CoV-2 target nucleic acids are NOT DETECTED.  The SARS-CoV-2 RNA is generally detectable in upper respiratory specimens during the acute phase of infection. The lowest concentration of SARS-CoV-2 viral copies this assay can detect is 138 copies/mL. A negative result does not preclude SARS-Cov-2 infection and should not be used as the sole basis for treatment or other patient management decisions. A negative result may occur with  improper specimen collection/handling, submission of specimen  other than nasopharyngeal swab, presence of viral mutation(s) within the areas targeted by this assay, and inadequate number of viral copies(<138 copies/mL). A negative result must be combined with clinical  observations, patient history, and epidemiological information. The expected result is Negative.  Fact Sheet for Patients:  EntrepreneurPulse.com.au  Fact Sheet for Healthcare Providers:  IncredibleEmployment.be  This test is no t yet approved or cleared by the [place] FDA and  has been authorized for detection and/or diagnosis of SARS-CoV-2 by FDA under an Emergency Use Authorization (EUA). This EUA will remain  in effect (meaning this test can be used) for the duration of the COVID-19 declaration under Section 564(b)(1) of the Act, 21 U.S.C.section 360bbb-3(b)(1), unless the authorization is terminated  or revoked sooner.       Influenza A by PCR NEGATIVE NEGATIVE Final   Influenza B by PCR NEGATIVE NEGATIVE Final    Comment: (NOTE) The Xpert Xpress SARS-CoV-2/FLU/RSV plus assay is intended as an aid in the diagnosis of influenza from Nasopharyngeal swab specimens and should not be used as a sole basis for treatment. Nasal washings and aspirates are unacceptable for Xpert Xpress SARS-CoV-2/FLU/RSV testing.  Fact Sheet for Patients: EntrepreneurPulse.com.au  Fact Sheet for Healthcare Providers: IncredibleEmployment.be  This test is not yet approved or cleared by the [place] FDA and has been authorized for detection and/or diagnosis of SARS-CoV-2 by FDA under an Emergency Use Authorization (EUA). This EUA will remain in effect (meaning this test can be used) for the duration of the COVID-19 declaration under Section 564(b)(1) of the Act, 21 U.S.C. section 360bbb-3(b)(1), unless the authorization is terminated or revoked.  Performed at [facility], [address] [place], [place]  [address]   MRSA Next Gen by PCR, Nasal     Status: None   Collection Time: 03/20/21 11:37 PM   Specimen: Nasal Mucosa; Nasal Swab  Result Value Ref Range Status   MRSA by PCR Next Gen NOT DETECTED NOT DETECTED Final    Comment: (NOTE) The GeneXpert MRSA Assay (FDA approved for NASAL specimens only), is one component of a comprehensive MRSA colonization surveillance program. It is not intended to diagnose MRSA infection nor to guide or monitor treatment for MRSA infections. Test performance is not FDA approved in patients less than 21 years old. Performed at [facility], [address] [place], [place] [address]   Culture, blood (Routine X 2) w Reflex to ID Panel     Status: None (Preliminary result)   Collection Time: 03/22/21 12:07 PM   Specimen: BLOOD RIGHT ARM  Result Value Ref Range Status   Specimen Description BLOOD RIGHT ARM BOTTLES DRAWN AEROBIC ONLY  Final   Special Requests Blood Culture adequate volume  Final   Culture   Final    NO GROWTH < 24 HOURS Performed at [facility], [address] [place], [place] [address]    Report Status PENDING  Incomplete  Culture, blood (Routine X 2) w Reflex to ID Panel     Status: None (Preliminary result)   Collection Time: 03/22/21 12:22 PM   Specimen: BLOOD RIGHT HAND  Result Value Ref Range Status   Specimen Description BLOOD RIGHT HAND BOTTLES DRAWN AEROBIC ONLY  Final   Special Requests Blood Culture adequate volume  Final   Culture   Final    NO GROWTH < 24 HOURS Performed at [facility], [address], [place], [place] [address]    Report Status PENDING  Incomplete     Scheduled Meds:  aspirin EC  81 mg Oral Daily   atorvastatin  80 mg Oral Daily   budesonide (PULMICORT) nebulizer solution  0.5 mg Nebulization BID   Chlorhexidine Gluconate Cloth  6 each Topical Daily   [START ON 03/24/2021] clopidogrel  75 mg Oral Daily   escitalopram  10 mg Oral Daily   insulin aspart  0-5 Units Subcutaneous QHS   insulin aspart  0-9  Units Subcutaneous TID WC   insulin glargine-yfgn  5 Units Subcutaneous Daily   ipratropium  0.5 mg Nebulization TID   levalbuterol  0.63 mg Nebulization TID   metoprolol tartrate  12.5 mg Oral BID   ranolazine  500 mg Oral BID   tamsulosin  0.4 mg Oral QPC supper   Continuous Infusions:  0.9 % NaCl with KCl 20 mEq / L 50 mL/hr at 03/23/21 0912   ampicillin (OMNIPEN) IV     cefTRIAXone (ROCEPHIN)  IV     heparin 1,500 Units/hr (03/23/21 0912)   nitroGLYCERIN 10 mcg/min (03/23/21 0912)    Procedures/Studies: CT ABDOMEN PELVIS WO CONTRAST  Result Date: 03/22/2021 CLINICAL DATA:  Sepsis, leukocytosis bacteremia. Concern for abdominal infection EXAM: CT ABDOMEN AND PELVIS WITHOUT CONTRAST TECHNIQUE: Multidetector CT imaging of the abdomen and pelvis was performed following the standard protocol without IV contrast. COMPARISON:  CT July 23, 2020 FINDINGS: Lower chest: Small bilateral pleural effusions with right greater than left basilar airspace consolidations. Prior median sternotomy coronary artery calcifications. Hepatobiliary: Unremarkable noncontrast appearance of the hepatic parenchyma. Gallbladder surgically absent. No biliary ductal dilation. Pancreas: No pancreatic ductal dilation or evidence of acute inflammation. Pancreatic atrophy. Spleen: Within normal limits. Adrenals/Urinary Tract: Left greater than right cortical renal atrophy and scarring. Massive distension of the urinary bladder. Prominence of the right ureter and collecting system. No ureteral stone visualized. No bladder calculus. Stomach/Bowel: Stomach is unremarkable for degree of distension. No pathologic dilation of small or large bowel. Radiopaque enteric contrast traverses the rectum. No evidence of acute bowel inflammation. Vascular/Lymphatic: Aortic and branch vessel atherosclerosis. No abdominal aortic aneurysm. No pathologically enlarged abdominal or pelvic lymph nodes. Reproductive: Dystrophic prostatic calcifications.  Stable penile calcifications. Other: Trace abdominopelvic free fluid.  No pneumoperitoneum. Musculoskeletal: Multilevel degenerative changes spine. No acute osseous abnormality. IMPRESSION: 1. Massive distension of the urinary bladder with prominence of the right ureter and collecting system, likely related to back pressure. Consider in and out catheterization if patient is unable to void. 2. Small bilateral pleural effusions with right greater than left basilar airspace consolidations, concerning for pneumonia with parapneumonic effusion. 3. Trace abdominopelvic free fluid. 4. Aortic Atherosclerosis (ICD10-I70.0). Electronically Signed   By: Dahlia Bailiff M.D.   On: 03/22/2021 17:41   DG Chest Port 1 View  Result Date: 03/20/2021 CLINICAL DATA:  Questionable sepsis. EXAM: PORTABLE CHEST 1 VIEW COMPARISON:  Chest x-ray 12/27/2017. FINDINGS: Patient is status post cardiac surgery. There are atherosclerotic calcifications of the aorta. There are new reticulonodular opacities in both lower lungs, right greater than left. Costophrenic angles are clear. There is no pneumothorax. No acute fractures are seen. IMPRESSION: 1. Reticulonodular opacities in the bilateral lung bases, right greater than left. Findings may be related to increasing pulmonary nodules and/or superimposed infection. Electronically Signed   By: Ronney Asters M.D.   On: 03/20/2021 18:38   ECHOCARDIOGRAM COMPLETE  Result Date: 03/21/2021    ECHOCARDIOGRAM REPORT   Patient Name:   [name] Date of Exam: 03/21/2021 Medical Rec #:  [id number]     Height:       71.0 in Accession #:    [id number]    Weight:       169.5 lb Date of Birth:  01/31/1941  BSA:          1.966 m Patient Age:    45 years      BP:           1417/63 mmHg Patient Gender: M             HR:           67 bpm. Exam Location:  [facility] Procedure: 2D Echo, Cardiac Doppler and Color Doppler Indications:    STEMI (ST elevation myocardial infarction) [facility]) [[id number]]  History:         Patient has prior history of Echocardiogram examinations, most                 recent 01/09/2021. CAD; Risk Factors:Diabetes and Hypertension.  Sonographer:    Bernadene Person RDCS Referring Phys: [id number] [place]  1. Distal septal, apical mid and basal inferior wall hypokinesis . Left ventricular ejection fraction, by estimation, is 35 to 40%. The left ventricle has moderately decreased function. The left ventricle demonstrates regional wall motion abnormalities (see scoring diagram/findings for description). Left ventricular diastolic parameters were normal.  2. Right ventricular systolic function is normal. The right ventricular size is normal. There is moderately elevated pulmonary artery systolic pressure.  3. Left atrial size was moderately dilated.  4. 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 The mitral valve is abnormal. Mild mitral valve regurgitation. No evidence of mitral stenosis.  5. The aortic valve is tricuspid. There is mild calcification of the aortic valve. Aortic valve regurgitation is not visualized. Mild aortic valve sclerosis is present, with no evidence of aortic valve stenosis.  6. The inferior vena cava is normal in size with greater than 50% respiratory variability, suggesting right atrial pressure of 3 mmHg. FINDINGS  Left Ventricle: Distal septal, apical mid and basal inferior wall hypokinesis. Left ventricular ejection fraction, by estimation, is 35 to 40%. The left ventricle has moderately decreased function. The left ventricle demonstrates regional wall motion abnormalities. The left ventricular internal cavity size was normal in size. There is no left ventricular hypertrophy. Left ventricular diastolic parameters were normal. Right Ventricle: The right ventricular size is normal. No increase in right ventricular wall thickness. Right ventricular systolic function is normal. There is moderately elevated pulmonary artery systolic pressure. The tricuspid regurgitant velocity is 3.29  m/s, and with an assumed right atrial pressure of 8 mmHg, the estimated right ventricular systolic pressure is 11.9 mmHg. Left Atrium: Left atrial size was moderately dilated. Right Atrium: Right atrial size was normal in size. Pericardium: There is no evidence of pericardial effusion. 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 The mitral valve is abnormal. There is mild thickening of the mitral valve leaflet(s). There is mild calcification of the mitral valve leaflet(s). Mild mitral valve regurgitation. No evidence of mitral valve stenosis. Tricuspid Valve: The tricuspid valve is normal in structure. Tricuspid valve regurgitation is mild . No evidence of tricuspid stenosis. Aortic Valve: The aortic valve is tricuspid. There is mild calcification of the aortic valve. Aortic valve regurgitation is not visualized. Mild aortic valve sclerosis is present, with no evidence of aortic valve stenosis. Pulmonic Valve: The pulmonic valve was normal in structure. Pulmonic valve regurgitation is trivial. No evidence of pulmonic stenosis. Aorta: The aortic root is normal in size and structure. Venous: The inferior vena cava is normal in size with greater than 50% respiratory variability, suggesting right atrial pressure of 3 mmHg. IAS/Shunts: The interatrial septum was not assessed.  LEFT VENTRICLE PLAX 2D LVIDd:  5.40 cm LVIDs:         3.80 cm LV PW:         1.30 cm LV IVS:        0.80 cm LVOT diam:     2.00 cm LV SV:         51 LV SV Index:   26 LVOT Area:     3.14 cm  LV Volumes (MOD) LV vol d, MOD A2C: 110.0 ml LV vol d, MOD A4C: 97.8 ml LV vol s, MOD A2C: 76.2 ml LV vol s, MOD A4C: 62.3 ml LV SV MOD A2C:     33.8 ml LV SV MOD A4C:     97.8 ml LV SV MOD BP:      36.7 ml RIGHT VENTRICLE RV S prime:     7.15 cm/s TAPSE (M-mode): 1.2 cm LEFT ATRIUM             Index        RIGHT ATRIUM           Index LA diam:        4.30 cm 2.19 cm/m   RA Area:     15.20 cm LA Vol (A2C):   53.1 ml 27.02 ml/m  RA Volume:   37.90 ml  19.28  ml/m LA Vol (A4C):   46.2 ml 23.50 ml/m LA Biplane Vol: 50.0 ml 25.44 ml/m  AORTIC VALVE LVOT Vmax:   83.60 cm/s LVOT Vmean:  54.100 cm/s LVOT VTI:    0.163 m  AORTA Ao Root diam: 3.70 cm Ao Asc diam:  3.40 cm MR Peak grad: 115.3 mmHg  TRICUSPID VALVE MR Mean grad: 80.0 mmHg   TR Peak grad:   43.3 mmHg MR Vmax:      537.00 cm/s TR Vmax:        329.00 cm/s MR Vmean:     435.0 cm/s                           SHUNTS                           Systemic VTI:  0.16 m                           Systemic Diam: 2.00 cm Jenkins Rouge MD Electronically signed by Jenkins Rouge MD Signature Date/Time: 03/21/2021/1:57:44 PM    Final     Orson Eva, DO  [facility]  If 7PM-7AM, please contact night-coverage [email or web address] Password [email or web address] 03/23/2021, 12:11 PM   LOS: 3 days

## 2021-03-23 NOTE — Progress Notes (Signed)
Gave extra doses xopenex x3 for chest pain, wheezes and tightness

## 2021-03-23 NOTE — Consult Note (Signed)
Cardiology Consultation Note:   Patient ID: JESAIAH FABIANO MRN: [id number]; DOB: February 03, 1941   Admission date: 03/20/2021  PCP:  [name], MD   [facility] Providers Cardiologist:  New to [facility]  Chief Complaint:  NSTEMI  Patient Profile:   ADRIENNE DELAY is a 80 y.o. 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  History of Present Illness:   Mr. Monnier notes that he is feeling improved from where he started 03/20/21.  Patient noted that he had originally presented with nausea and vomiting, and hypoxia.  Felt weak.  Had not had chest pain.  Notes that he hasn't been hospitalized recently, loved to play with his grand children and was able to tinker in the year without issue.  Had Inferior STEMI but lab was not activated and was planned ot be sent to [facility].Had AF RVR through course.  Started on broad spectrum ABX.  Through 03/21/21 multiple provdiers have discussed with him transfer, but he confirms that he wishes to stay at [facility] (I have confirmed with him this today).  Over weekend had new WMA consistent with SVG-D1 Occlusion.  Noted 10/10 chest this AM.  On Nitroglycerin and with IV Morphine pain has improved.  This AM no chest pain, chest pressure, SOB.  Fevers, chills, and nausea have improved.   Past Medical History:  Diagnosis Date   Cancer ([place])    Chronic kidney disease    Coronary artery disease    Diabetes mellitus    Hypertension    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   LIMA to LAD, SVG to D1, SVG to OM2, SVG to RCA, open vein harvest right thigh and lower leg   Spontaneous pneumothorax 09/15/2010   right    Past Surgical History:  Procedure Laterality Date   ANKLE FRACTURE SURGERY  2008   [facility]   APPENDECTOMY  [phone]   CHEST TUBE  INSERTION Right 09/15/2010   Dr Servando Snare - spontaneous PTX   CHOLECYSTECTOMY  2008   COLONOSCOPY N/A 09/07/2017   Procedure: COLONOSCOPY;  Surgeon: Daneil Dolin, MD;  Location: [facility] ENDO [facility]ITE;  Service: Endoscopy;  Laterality: N/A;  10:30am   CORONARY ARTERY BYPASS GRAFT  2003   EYE SURGERY  2001   Cataracts removed bilaterally   INCISIONAL HERNIA REPAIR N/A 01/29/2015   Procedure: Fatima Blank HERNIORRHAPHY WITH MESH;  Surgeon: Aviva Signs, MD;  Location: [facility] ORS;  Service: General;  Laterality: N/A;   INSERTION OF MESH N/A 01/29/2015   Procedure: INSERTION OF MESH;  Surgeon: Aviva Signs, MD;  Location: [facility] ORS;  Service: General;  Laterality: N/A;   [place] CATH AND CORS/GRAFTS ANGIOGRAPHY N/A 06/20/2017   Procedure: LEFT HEART CATH AND CORS/GRAFTS ANGIOGRAPHY;  Surgeon: Martinique, Peter M, MD;  Location: [facility] CV LAB;  Service: Cardiovascular;  Laterality: N/A;   ORIF ANKLE FRACTURE  08/26/2011   Procedure: OPEN REDUCTION INTERNAL FIXATION (ORIF) ANKLE FRACTURE;  Surgeon: Sanjuana Kava, MD;  Location: [facility] ORS;  Service: Orthopedics;  Laterality: Right;   POLYPECTOMY  09/07/2017   Procedure: POLYPECTOMY;  Surgeon: Daneil Dolin, MD;  Location: [facility];  Service: Endoscopy;;  ascending x2 (cold snare)   PROSTATE SURGERY  2012   Enlarged prostate  TRANSURETHRAL RESECTION OF PROSTATE  2012     Medications Prior to Admission: Prior to Admission medications   Medication Sig Start Date End Date Taking? Authorizing Provider  alendronate (FOSAMAX) 70 MG tablet Take 70 mg by mouth See admin instructions. Take 70 mg weekly every Sunday 12/25/20  Yes [provider]  amLODipine (NORVASC) 10 MG tablet TAKE ONE TABLET (10MG  TOTAL) BY MOUTH DAILY 01/27/21  Yes Strader, Fransisco Hertz, PA-C  aspirin EC 81 MG tablet Take 1 tablet (81 mg total) by mouth in the morning and at bedtime. Patient taking differently: Take 81 mg by mouth daily. 07/17/20   Yes Kathie Dike, MD  benzonatate (TESSALON) 200 MG capsule Take 200 mg by mouth 3 (three) times daily as needed. 03/19/21  Yes [provider]  Cholecalciferol (VITAMIN D3) 5000 units CAPS Take 5,000 Units by mouth daily.   Yes [provider]  dabrafenib mesylate (TAFINLAR) 50 MG capsule TAKE 2 CAPSULES (100 MG TOTAL) BY MOUTH 2 (TWO) TIMES DAILY. TAKE ON AN EMPTY STOMACH 1 HOUR BEFORE OR 2 HOURS AFTER MEALS. 02/04/21  Yes Derek Jack, MD  escitalopram (LEXAPRO) 10 MG tablet Take 10 mg by mouth daily.  04/03/18  Yes [provider]  glimepiride (AMARYL) 2 MG tablet Take 2 mg by mouth daily with breakfast.    Yes [provider]  insulin lispro (HUMALOG) 100 UNIT/ML injection Inject 2-10 Units into the skin 3 (three) times daily before meals. Per sliding scale   Yes [provider]  insulin lispro (HUMALOG) 100 UNIT/ML KwikPen Inject 2-10 Units into the skin See admin instructions. Inject as per sliding scale: if 200-250=2 units, 251-300=4 units, 301-350=6 units, 351-400=8 units, over 400 give 10 units and notify MD, subcuatneoulsy before meals and at bedtime for dm   Yes [provider]  isosorbide mononitrate (IMDUR) 30 MG 24 hr tablet TAKE ONE TABLET (30MG  TOTAL) BY MOUTH DAILY 01/27/21  Yes Strader, [place] M, PA-C  metFORMIN (GLUCOPHAGE) 500 MG tablet Take 1 tablet by mouth 2 (two) times daily. 08/07/20  Yes [provider]  methocarbamol (ROBAXIN) 500 MG tablet Take 1 tablet (500 mg total) by mouth every 8 (eight) hours as needed for muscle spasms. 07/17/20  Yes Kathie Dike, MD  metoprolol (LOPRESSOR) 100 MG tablet Take 100 mg by mouth 2 (two) times daily.   Yes [provider]  nitroGLYCERIN (NITROSTAT) 0.4 MG SL tablet Place 0.4 mg under the tongue every 5 (five) minutes as needed for chest pain.  06/01/17  Yes [provider]  ondansetron (ZOFRAN ODT) 4 MG disintegrating tablet 4mg  ODT q4 hours prn  nausea/vomit 07/23/20  Yes Milton Ferguson, MD  ondansetron (ZOFRAN) 8 MG tablet Take 1 tablet (8 mg total) by mouth every 8 (eight) hours as needed for nausea or vomiting. 09/27/17  Yes Derek Jack, MD  pioglitazone (ACTOS) 45 MG tablet Take 45 mg by mouth daily. 08/14/20  Yes [provider]  polyethylene glycol (MIRALAX / GLYCOLAX) 17 g packet Take 17 g by mouth daily as needed for mild constipation. 07/17/20  Yes Kathie Dike, MD  prochlorperazine (COMPAZINE) 10 MG tablet Take 1 tablet (10 mg total) by mouth every 6 (six) hours as needed for nausea or vomiting. 09/27/17  Yes Derek Jack, MD  tamsulosin (FLOMAX) 0.4 MG CAPS capsule Take 1 capsule (0.4 mg total) by mouth daily after supper. 07/17/20  Yes Kathie Dike, MD  trametinib dimethyl sulfoxide (MEKINIST) 0.5 MG tablet TAKE 2 TABLETS (1 MG TOTAL) BY  MOUTH DAILY. TAKE 1 HOUR BEFORE OR 2 HOURS AFTER A MEAL. STORE REFRIGERATED IN ORIGINAL CONTAINER. 02/04/21  Yes Derek Jack, MD  ONE TOUCH ULTRA TEST test strip  04/17/18   [provider]     Allergies:    Allergies  Allergen Reactions   Oxycodone     Social History:   Social History   Socioeconomic History   Marital status: Married    Spouse name: Not on file   Number of children: Not on file   Years of education: Not on file   Highest education level: Not on file  Occupational History   Not on file  Tobacco Use   Smoking status: Former    Packs/day: 1.00    Years: 33.00    Pack years: 33.00    Types: Cigarettes    Quit date: 05/17/1987    Years since quitting: 33.8   Smokeless tobacco: Never  Vaping Use   Vaping Use: Never used  Substance and Sexual Activity   Alcohol use: No   Drug use: No   Sexual activity: Not Currently  Other Topics Concern   Not on file  Social History Narrative   Not on file   Social Determinants of Health   Financial Resource Strain: Low Risk    Difficulty of Paying Living Expenses: Not very hard   Food Insecurity: No Food Insecurity   Worried About Running Out of Food in the Last Year: Never true   [place] in the Last Year: Never true  Transportation Needs: No Transportation Needs   Lack of Transportation (Medical): No   Lack of Transportation (Non-Medical): No  Physical Activity: Inactive   Days of Exercise per Week: 0 days   Minutes of Exercise per Session: 0 min  Stress: No Stress Concern Present   Feeling of Stress : Not at all  Social Connections: Socially Integrated   Frequency of Communication with Friends and Family: More than three times a week   Frequency of Social Gatherings with Friends and Family: Three times a week   Attends Religious Services: 1 to 4 times per year   Active Member of Clubs or Organizations: No   Attends Music therapist: 1 to 4 times per year   Marital Status: Married  Human resources officer Violence: Not At Risk   Fear of Current or Ex-Partner: No   Emotionally Abused: No   Physically Abused: No   Sexually Abused: No    Family History:   The patient's family history includes Aneurysm in his mother; Cancer in his brother; Colon cancer in his maternal uncle; Heart attack in his father. There is no history of Gastric cancer or Esophageal cancer.    ROS:  Please see the history of present illness.  All other ROS reviewed and negative.     Physical Exam/Data:   Vitals:   03/23/21 0749 03/23/21 0800 03/23/21 0830 03/23/21 0900  BP:  (!) 122/53 97/71 (!) 116/57  Pulse:  97 100 95  Resp:  (!) 22 (!) 29 (!) 21  Temp: 98.2 F (36.8 C)     TempSrc: Oral     SpO2:  96% 98% 100%  Weight:      Height:        Intake/Output Summary (Last 24 hours) at 03/23/2021 0915 Last data filed at 03/23/2021 0912 Gross per 24 hour  Intake 2294.66 ml  Output 1775 ml  Net 519.66 ml   Last 3 Weights 03/23/2021 03/22/2021 03/21/2021  Weight (  lbs) 182 lb 1.6 oz 172 lb 2.9 oz 169 lb 8.5 oz  Weight (kg) 82.6 kg 78.1 kg 76.9 kg     Body mass  index is 25.4 kg/m.   Gen: Mild respiratory distress, malnourished male   Neck: No JVD,  carotid bruit Cardiac: No Rubs or Gallops, No Murmur, regular rate, +2 radial pulses Respiratory: Expiratory wheezes throughout, rhoncorous, minimal respiratory effort, tachypnea  GI: Soft, nontender, non-distended  MS: No  edema; moves all extremities Integument: Skin feels warm, there is a small plaque over the right eye, there is a wrist pressure wound that is well bandaged Neuro:  At time of evaluation, alert and oriented to person/place/time/situation  Psych: Normal affect, patient feels better   EKG:  The ECG that was done  was personally reviewed and demonstrates SR rate 87 rare PACs prominent anterior-anterolateral ST depressions that have improved since admission  Relevant CV Studies:  Transthoracic Echocardiogram: Date: 03/21/21 Results: Ectopy and LAD disease, possible RCA disease as well 1. Distal septal, apical mid and basal inferior wall hypokinesis . Left  ventricular ejection fraction, by estimation, is 35 to 40%. The left  ventricle has moderately decreased function. The left ventricle  demonstrates regional wall motion abnormalities  (see scoring diagram/findings for description). Left ventricular diastolic  parameters were normal.   2. Right ventricular systolic function is normal. The right ventricular  size is normal. There is moderately elevated pulmonary artery systolic  pressure.   3. Left atrial size was moderately dilated.   4. 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 The mitral valve is abnormal. Mild mitral valve  regurgitation. No evidence of mitral stenosis.   5. The aortic valve is tricuspid. There is mild calcification of the  aortic valve. Aortic valve regurgitation is not visualized. Mild aortic  valve sclerosis is present, with no evidence of aortic valve stenosis.   6. The inferior vena cava is normal in size with greater than 50%  respiratory variability, suggesting right  atrial pressure of 3 mmHg.   Left/Right Heart Catheterizations: Date: 06/20/2017 Results: Prox LAD to Mid LAD lesion is 100% stenosed. Ost 1st Diag lesion is 90% stenosed. Ost Cx to Prox Cx lesion is 100% stenosed. Prox RCA to Dist RCA lesion is 100% stenosed. LIMA graft was visualized by angiography and is normal in caliber. The graft exhibits no disease. Origin to Prox Graft lesion is 80% stenosed. Prox Graft to Mid Graft lesion is 95% stenosed. Dist Graft lesion is 90% stenosed. Prox Graft to Mid Graft lesion is 90% stenosed. Dist Graft lesion is 90% stenosed. Origin to Prox Graft lesion is 99% stenosed. LV end diastolic pressure is normal.   1. Severe 3 vessel obstructive CAD 2. Patent LIMA to the LAD 3. Severe SVG disease:     99% proximal SVG to the diagonal with TIMI 1 flow     Multiple high grade lesions in SVG to OM     Multiple high grade lesions in SVG to RCA 4. Normal LVEDP.   Plan: Recommend consideration for redo CABG. PCI is of limited benefit in this setting with multiple vein graft lesions in old SVGs. Will check Echo to assess LV function.     Laboratory Data:  High Sensitivity Troponin:   Recent Labs  Lab 03/20/21 2004 03/20/21 2210 03/21/21 0017 03/21/21 0236 03/21/21 0418  TROPONINIHS 3,016* 3,762* 4,413* 6,544* 8,310*      Chemistry Recent Labs  Lab 03/22/21 0140 03/23/21 0458  NA 128* 127*  K 3.2* 3.8  CL  102 103  CO2 20* 19*  GLUCOSE 195* 200*  BUN 15 11  CREATININE 1.23 1.20  CALCIUM 7.1* 6.9*  MG  --  1.8  GFRNONAA 59* >60  ANIONGAP 6 5    Recent Labs  Lab 03/22/21 0140 03/23/21 0458  PROT 5.7* 5.7*  ALBUMIN 1.7* 1.6*  AST 40 21  ALT 14 11  ALKPHOS 80 74  BILITOT 0.6 0.6   Lipids No results for input(s): CHOL, TRIG, HDL, LABVLDL, LDLCALC, CHOLHDL in the last 168 hours. Hematology Recent Labs  Lab 03/22/21 0140 03/23/21 0458  WBC 9.4 8.7  RBC 3.28* 3.35*  HGB 8.3* 8.4*  HCT 25.7* 26.3*  MCV 78.4* 78.5*  MCH 25.3*  25.1*  MCHC 32.3 31.9  RDW 15.3 15.5  PLT 261 253   Thyroid No results for input(s): TSH, FREET4 in the last 168 hours. BNPNo results for input(s): BNP, PROBNP in the last 168 hours.  DDimer No results for input(s): DDIMER in the last 168 hours.   Radiology/Studies:  CT ABDOMEN PELVIS WO CONTRAST  Result Date: 03/22/2021 CLINICAL DATA:  Sepsis, leukocytosis bacteremia. Concern for abdominal infection EXAM: CT ABDOMEN AND PELVIS WITHOUT CONTRAST TECHNIQUE: Multidetector CT imaging of the abdomen and pelvis was performed following the standard protocol without IV contrast. COMPARISON:  CT July 23, 2020 FINDINGS: Lower chest: Small bilateral pleural effusions with right greater than left basilar airspace consolidations. Prior median sternotomy coronary artery calcifications. Hepatobiliary: Unremarkable noncontrast appearance of the hepatic parenchyma. Gallbladder surgically absent. No biliary ductal dilation. Pancreas: No pancreatic ductal dilation or evidence of acute inflammation. Pancreatic atrophy. Spleen: Within normal limits. Adrenals/Urinary Tract: Left greater than right cortical renal atrophy and scarring. Massive distension of the urinary bladder. Prominence of the right ureter and collecting system. No ureteral stone visualized. No bladder calculus. Stomach/Bowel: Stomach is unremarkable for degree of distension. No pathologic dilation of small or large bowel. Radiopaque enteric contrast traverses the rectum. No evidence of acute bowel inflammation. Vascular/Lymphatic: Aortic and branch vessel atherosclerosis. No abdominal aortic aneurysm. No pathologically enlarged abdominal or pelvic lymph nodes. Reproductive: Dystrophic prostatic calcifications. Stable penile calcifications. Other: Trace abdominopelvic free fluid.  No pneumoperitoneum. Musculoskeletal: Multilevel degenerative changes spine. No acute osseous abnormality. IMPRESSION: 1. Massive distension of the urinary bladder with prominence  of the right ureter and collecting system, likely related to back pressure. Consider in and out catheterization if patient is unable to void. 2. Small bilateral pleural effusions with right greater than left basilar airspace consolidations, concerning for pneumonia with parapneumonic effusion. 3. Trace abdominopelvic free fluid. 4. Aortic Atherosclerosis (ICD10-I70.0). Electronically Signed   By: Dahlia Bailiff M.D.   On: 03/22/2021 17:41     Assessment and Plan:   CAD s/p 2008 CABG with multi-vessel graft disease referred for redo CABG in 2019 presenting with NSTEMI HTN with DM New PAD Stable metastatic lung cancer on dabrafenib and tametinib Normocytic anemia Bacteremia - Patient has had multiple discussion with providers and would like to stay here: I have reviewed his 2019 LHC and do not see a clear target for PCI of his graft disease; he was not a candidate for repeat CABG in 2019 - metoprolol tartrate 12.5 mg PO BID, ASA, and atorvastatin - will start Plavix with plavix load today, Ranexa 500 mg PO, and we will continued heparin for medical  - if hgb stable will need transition to eliquis peri-discharge - presently he would like to be DNR and DNI; given his respiratory status there is moderate risk of  intubation; would defer TEE until potentially 03/25/21 and give his respiratory status a chance to improve - he was fairly function prior to this but [place] conversations may be reasonable   Risk Assessment/Risk Scores:    TIMI Risk Score for Unstable Angina or Non-ST Elevation MI:   The patient's TIMI risk score is 7, which indicates a 41% risk of all cause mortality, new or recurrent myocardial infarction or need for urgent revascularization in the next 14 days.  New York Heart Association (NYHA) Functional Class NYHA Class II  CHA2DS2-VASc Score = 6   This indicates a 9.7% annual risk of stroke. The patient's score is based upon: CHF History: 1 HTN History: 1 Diabetes History:  1 Stroke History: 0 Vascular Disease History: 1 Age Score: 2 Gender Score: 0    For questions or updates, please contact [facility] Please consult [email or web address] for contact info under     Signed, Werner Lean, MD  03/23/2021 9:15 AM

## 2021-03-23 NOTE — Consult Note (Signed)
Consultation Note Date: 03/23/2021   Patient Name: [name]  DOB: 06-07-1940  MRN: [id number]  Age / Sex: 80 y.o., male  PCP: Anthony Noble, MD Referring Physician: Orson Eva, MD  Reason for Consultation: Establishing goals of care  HPI/Patient Profile: 80 y.o. male  with past medical history of metastatic lung cancer based on multiple in bilateral lungs currently on palliative immunotherapy last seen by Dr. Raliegh Owen on 1 September, follow-up every 3 months, CAD status post four-vessel CABG 2003 versus 2008, STEMI 2019 but not a candidate for CABG/stenting, DM2, CKD, TURP 2012, colon polypectomy 2019, ORIF right ankle fracture 2013, hernia repair 2016 admitted on 03/20/2021 with severe sepsis, bacteremia, CAD, STEMI.   Clinical Assessment and Goals of Care: I have reviewed medical records including EPIC notes, labs and imaging, received report from RN, assessed the patient and then met at the bedside along with wife [name] and eldest son [name].  [name] is alert and oriented x3, able to make his needs known.    We meet to discuss diagnosis prognosis, GOC, EOL wishes, disposition and options.  I introduced Palliative Medicine as specialized medical care for people living with serious illness. It focuses on providing relief from the symptoms and stress of a serious illness. The goal is to improve quality of life for both the patient and the family.  We discussed a brief life review of the patient.  Mr. and Mrs. [name] have 3 sons.  He lives independently in his own home with his wife.  He is independent with ADLs, but his wife and children assist with IADLs.    We then focused on their current illness.  Patient and family seem knowledgeable about his health and the treatment plan.  We talked about coronary artery disease, the use of heparin and nitro, no surgical/cath intervention at this time.  We talked about bacteremia,  TEE, antibiotic use.  We talked about lung cancer and the treatment plan and follow-ups.  Advanced directives, concepts specific to code status, were considered and discussed.  [name] endorses no life support.  His family shares that he can about this decision.  Hospice and Palliative Care services outpatient were not discussed today, but will be covered before discharge.  Discussed the importance of continued conversation with patient and family and the medical providers regarding overall plan of care and treatment options, ensuring decisions are within the context of the patient's values and GOCs.  Questions and concerns were addressed.  The patient and family was encouraged to call with questions or concerns.  PMT will continue to support holistically.  Conference with attending, bedside nursing staff, transition of care team related to patient condition, needs, goals of care, disposition.   HCPOA   NEXT OF KIN -[name] names his wife, [name], as his healthcare surrogate.  He has 3 adult sons who are deceased.    SUMMARY OF RECOMMENDATIONS   At this point continue to treat the treatable but no CPR or intubation. Time for outcomes. Agreeable to  short-term rehab if qualified.  Code Status/Advance Care Planning: DNR  Symptom Management:  Per hospitalist, no additional needs at this time.  Palliative Prophylaxis:  Frequent Pain Assessment, Oral Care, and Turn Reposition  Additional Recommendations (Limitations, Scope, Preferences): Continue to treat the treatable but no CPR or intubation.  Psycho-social/Spiritual:  Desire for further Chaplaincy support:no Additional Recommendations: Caregiving  Support/Resources  Prognosis:  Unable to determine, based on outcomes.  Guarded at this point.  The next few days will indicate whether [name] is able to have a meaningful recovery.  Discharge Planning:  To be determined, based on outcomes.  At this point, agreeable to  short-term rehab if qualified       Primary Diagnoses: Present on Admission:  STEMI (ST elevation myocardial infarction) ([place])  CKD (chronic kidney disease) stage 3, GFR 30-59 ml/min ([facility])  Adenocarcinoma of lung, stage 4 ([place])   I have reviewed the medical record, interviewed the patient and family, and examined the patient. The following aspects are pertinent.  Past Medical History:  Diagnosis Date   Cancer ([place])    Chronic kidney disease    Coronary artery disease    Diabetes mellitus    Hypertension    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   LIMA to LAD, SVG to D1, SVG to OM2, SVG to RCA, open vein harvest right thigh and lower leg   Spontaneous pneumothorax 09/15/2010   right   Social History   Socioeconomic History   Marital status: Married    Spouse name: Not on file   Number of children: Not on file   Years of education: Not on file   Highest education level: Not on file  Occupational History   Not on file  Tobacco Use   Smoking status: Former    Packs/day: 1.00    Years: 33.00    Pack years: 33.00    Types: Cigarettes    Quit date: 05/17/1987    Years since quitting: 33.8   Smokeless tobacco: Never  Vaping Use   Vaping Use: Never used  Substance and Sexual Activity   Alcohol use: No   Drug use: No   Sexual activity: Not Currently  Other Topics Concern   Not on file  Social History Narrative   Not on file   Social Determinants of Health   Financial Resource Strain: Low Risk    Difficulty of Paying Living Expenses: Not very hard  Food Insecurity: No Food Insecurity   Worried About Running Out of Food in the Last Year: Never true   Ran Out of Food in the Last Year: Never true  Transportation Needs: No Transportation Needs   Lack of Transportation (Medical): No   Lack of Transportation (Non-Medical): No  Physical Activity: Inactive   Days of Exercise per Week: 0 days   Minutes of Exercise per Session: 0 min  Stress: No Stress Concern Present   Feeling of  Stress : Not at all  Social Connections: Socially Integrated   Frequency of Communication with Friends and Family: More than three times a week   Frequency of Social Gatherings with Friends and Family: Three times a week   Attends Religious Services: 1 to 4 times per year   Active Member of Clubs or Organizations: No   Attends Archivist Meetings: 1 to 4 times per year   Marital Status: Married   Family History  Problem Relation Age of Onset   Aneurysm Mother    Heart attack Father  Colon cancer Maternal Uncle    Cancer Brother    Gastric cancer Neg Hx    Esophageal cancer Neg Hx    Scheduled Meds:  aspirin EC  81 mg Oral Daily   atorvastatin  80 mg Oral Daily   budesonide (PULMICORT) nebulizer solution  0.5 mg Nebulization BID   Chlorhexidine Gluconate Cloth  6 each Topical Daily   [START ON 03/24/2021] clopidogrel  75 mg Oral Daily   escitalopram  10 mg Oral Daily   insulin aspart  0-5 Units Subcutaneous QHS   insulin aspart  0-9 Units Subcutaneous TID WC   insulin glargine-yfgn  5 Units Subcutaneous Daily   ipratropium  0.5 mg Nebulization TID   levalbuterol  0.63 mg Nebulization TID   metoprolol tartrate  12.5 mg Oral BID   phosphorus  500 mg Oral BID   ranolazine  500 mg Oral BID   tamsulosin  0.4 mg Oral QPC supper   Continuous Infusions:  ampicillin (OMNIPEN) IV     cefTRIAXone (ROCEPHIN)  IV     heparin 1,500 Units/hr (03/23/21 0912)   nitroGLYCERIN 10 mcg/min (03/23/21 0912)   PRN Meds:.acetaminophen **OR** acetaminophen, levalbuterol, nitroGLYCERIN, ondansetron **OR** ondansetron (ZOFRAN) IV, polyethylene glycol Medications Prior to Admission:  Prior to Admission medications   Medication Sig Start Date End Date Taking? Authorizing Provider  alendronate (FOSAMAX) 70 MG tablet Take 70 mg by mouth See admin instructions. Take 70 mg weekly every Sunday 12/25/20  Yes [provider]  amLODipine (NORVASC) 10 MG tablet TAKE ONE TABLET (10MG TOTAL)  BY MOUTH DAILY 01/27/21  Yes Strader, Fransisco Hertz, PA-C  aspirin EC 81 MG tablet Take 1 tablet (81 mg total) by mouth in the morning and at bedtime. Patient taking differently: Take 81 mg by mouth daily. 07/17/20  Yes Kathie Dike, MD  benzonatate (TESSALON) 200 MG capsule Take 200 mg by mouth 3 (three) times daily as needed. 03/19/21  Yes [provider]  Cholecalciferol (VITAMIN D3) 5000 units CAPS Take 5,000 Units by mouth daily.   Yes [provider]  dabrafenib mesylate (TAFINLAR) 50 MG capsule TAKE 2 CAPSULES (100 MG TOTAL) BY MOUTH 2 (TWO) TIMES DAILY. TAKE ON AN EMPTY STOMACH 1 HOUR BEFORE OR 2 HOURS AFTER MEALS. 02/04/21  Yes Derek Jack, MD  escitalopram (LEXAPRO) 10 MG tablet Take 10 mg by mouth daily.  04/03/18  Yes [provider]  glimepiride (AMARYL) 2 MG tablet Take 2 mg by mouth daily with breakfast.    Yes [provider]  insulin lispro (HUMALOG) 100 UNIT/ML injection Inject 2-10 Units into the skin 3 (three) times daily before meals. Per sliding scale   Yes [provider]  insulin lispro (HUMALOG) 100 UNIT/ML KwikPen Inject 2-10 Units into the skin See admin instructions. Inject as per sliding scale: if 200-250=2 units, 251-300=4 units, 301-350=6 units, 351-400=8 units, over 400 give 10 units and notify MD, subcuatneoulsy before meals and at bedtime for dm   Yes [provider]  isosorbide mononitrate (IMDUR) 30 MG 24 hr tablet TAKE ONE TABLET (30MG TOTAL) BY MOUTH DAILY 01/27/21  Yes Strader, [place] M, PA-C  metFORMIN (GLUCOPHAGE) 500 MG tablet Take 1 tablet by mouth 2 (two) times daily. 08/07/20  Yes [provider]  methocarbamol (ROBAXIN) 500 MG tablet Take 1 tablet (500 mg total) by mouth every 8 (eight) hours as needed for muscle spasms. 07/17/20  Yes Kathie Dike, MD  metoprolol (LOPRESSOR) 100 MG tablet Take 100 mg by mouth 2 (two) times  daily.   Yes [provider]  nitroGLYCERIN (NITROSTAT) 0.4  MG SL tablet Place 0.4 mg under the tongue every 5 (five) minutes as needed for chest pain.  06/01/17  Yes [provider]  ondansetron (ZOFRAN ODT) 4 MG disintegrating tablet 41m ODT q4 hours prn nausea/vomit 07/23/20  Yes ZMilton Ferguson MD  ondansetron (ZOFRAN) 8 MG tablet Take 1 tablet (8 mg total) by mouth every 8 (eight) hours as needed for nausea or vomiting. 09/27/17  Yes KDerek Jack MD  pioglitazone (ACTOS) 45 MG tablet Take 45 mg by mouth daily. 08/14/20  Yes [provider]  polyethylene glycol (MIRALAX / GLYCOLAX) 17 g packet Take 17 g by mouth daily as needed for mild constipation. 07/17/20  Yes MKathie Dike MD  prochlorperazine (COMPAZINE) 10 MG tablet Take 1 tablet (10 mg total) by mouth every 6 (six) hours as needed for nausea or vomiting. 09/27/17  Yes KDerek Jack MD  tamsulosin (FLOMAX) 0.4 MG CAPS capsule Take 1 capsule (0.4 mg total) by mouth daily after supper. 07/17/20  Yes MKathie Dike MD  trametinib dimethyl sulfoxide (MEKINIST) 0.5 MG tablet TAKE 2 TABLETS (1 MG TOTAL) BY MOUTH DAILY. TAKE 1 HOUR BEFORE OR 2 HOURS AFTER A MEAL. STORE REFRIGERATED IN ORIGINAL CONTAINER. 02/04/21  Yes KDerek Jack MD  ONE TOUCH ULTRA TEST test strip  04/17/18   [provider]   Allergies  Allergen Reactions   Oxycodone    Review of Systems  Unable to perform ROS: Age   Physical Exam Vitals and nursing note reviewed.  Constitutional:      General: He is not in acute distress.    Appearance: He is ill-appearing.  HENT:     Head: Normocephalic and atraumatic.     Mouth/Throat:     Mouth: Mucous membranes are moist.  Cardiovascular:     Rate and Rhythm: Normal rate.  Pulmonary:     Effort: Pulmonary effort is normal. No respiratory distress.  Musculoskeletal:        General: Swelling present.  Skin:    General: Skin is warm and dry.  Neurological:     Mental Status: He is alert and oriented to person, place, and time.   Psychiatric:        Mood and Affect: Mood normal.        Behavior: Behavior normal.    Vital Signs: BP (!) 121/55   Pulse 72   Temp (!) 97.2 F (36.2 C) (Axillary)   Resp 20   Ht '5\' 11"'  (1.803 m)   Wt 82.6 kg   SpO2 100%   BMI 25.40 kg/m  Pain Scale: 0-10 POSS *See Group Information*: 1-Acceptable,Awake and alert Pain Score: 0-No pain   SpO2: SpO2: 100 % O2 Device:SpO2: 100 % O2 Flow Rate: .O2 Flow Rate (L/min): 4 L/min  IO: Intake/output summary:  Intake/Output Summary (Last 24 hours) at 03/23/2021 1304 Last data filed at 03/23/2021 0912 Gross per 24 hour  Intake 2294.66 ml  Output 1175 ml  Net 1119.66 ml    LBM: Last BM Date: 03/23/21 Baseline Weight: Weight: 78.5 kg Most recent weight: Weight: 82.6 kg     Palliative Assessment/Data:   Flowsheet Rows    Flowsheet Row Most Recent Value  Intake Tab   Referral Department Hospitalist  Unit at Time of Referral ICU  Palliative Care Primary Diagnosis Cardiac  Date Notified 03/23/21  Palliative Care Type New Palliative care  Reason for referral Clarify Goals of Care  Date of Admission 03/20/21  Date first seen by Palliative Care 03/23/21  # of days Palliative referral response time 0 Day(s)  # of days Owen prior to Palliative referral 3  Clinical Assessment   Palliative Performance Scale Score 50%  Pain Max last 24 hours Not able to report  Pain Min Last 24 hours Not able to report  Dyspnea Max Last 24 Hours Not able to report  Dyspnea Min Last 24 hours Not able to report  Psychosocial & Spiritual Assessment   Palliative Care Outcomes        Time In: 1120 Time Out: 1230 Time Total: 70 minutes  Greater than 50%  of this time was spent counseling and coordinating care related to the above assessment and plan.  Signed by: Drue Novel, NP   Please contact Palliative Medicine Team phone at [phone] for questions and concerns.  For individual provider: See Shea Evans

## 2021-03-23 NOTE — Progress Notes (Signed)
This patient began to complain of sudden onset chest pain describing it as if "an elephant was jumping up and down on his chest". He rated this pain 10 out of 10 on a 0-10 pain scale (10 being the worst possible pain). Nitroglycerin gtt began to be titrated up following order. At that time 4L [place] of oxygen was applied. Respiratory Therapy was also called at this time and gave xopenex x3. Adefeso MD was notified and 2mg  morphine IVP was ordered and given. Patient began experiencing relief after interventions and Nitroglycerin was able to be decreased. This lasted around 35 minutes before the tightness and chest pain was completely resolved.    4128- At this time patient stated his pain is 0 (0-10 pain scale). He is resting comfortably. Vital Signs are stable.

## 2021-03-23 NOTE — Progress Notes (Signed)
Inpatient Diabetes Program Recommendations  AACE/ADA: New Consensus Statement on Inpatient Glycemic Control  Target Ranges:  Prepandial:   less than 140 mg/dL      Peak postprandial:   less than 180 mg/dL (1-2 hours)      Critically ill patients:  140 - 180 mg/dL   Results for [name], [name] (MRN [id number]) as of 03/23/2021 14:26  Ref. Range 03/22/2021 05:06 03/22/2021 11:48 03/22/2021 16:36 03/22/2021 21:53 03/23/2021 07:20 03/23/2021 11:52  Glucose-Capillary Latest Ref Range: 70 - 99 mg/dL 180 (H) 205 (H) 208 (H) 203 (H) 198 (H) 224 (H)    Review of Glycemic Control  Diabetes history: DM2 Outpatient Diabetes medications: Amaryl 2 mg QAM, Metformin 500 mg BID, Actos 45 mg daily, Humalog 2-10 units TID with meals Current orders for Inpatient glycemic control: Semglee 5 units daly, Novolog 0-9 units TID with meals, Novolog 0-5 units QHS  Inpatient Diabetes Program Recommendations:    Insulin: Please consider ordering Novolog 3 units TID with meals for meal coverage if patient eats at least 50% of meals.  Thanks, Barnie Alderman, RN, MSN, CDE Diabetes Coordinator Inpatient Diabetes Program [phone] (Team Pager from 8am to 5pm)

## 2021-03-23 NOTE — Progress Notes (Addendum)
ANTICOAGULATION CONSULT NOTE  Pharmacy Consult for Heparin Indication: chest pain/ACS and atrial fibrillation  Allergies  Allergen Reactions   Oxycodone     Patient Measurements: Height: 5\' 11"  (180.3 cm) Weight: 82.6 kg (182 lb 1.6 oz) IBW/kg (Calculated) : 75.3 Heparin Dosing Weight: 78.5 kg  Vital Signs: Temp: 98.2 F (36.8 C) (11/07 0749) Temp Source: Oral (11/07 0749) BP: 129/68 (11/07 0730) Pulse Rate: 101 (11/07 0730)  Labs: Recent Labs    03/20/21 1757 03/20/21 2004 03/21/21 0017 03/21/21 0236 03/21/21 0418 03/21/21 1417 03/22/21 0140 03/22/21 1014 03/23/21 0458  HGB 11.6*  --   --   --  8.9*  --  8.3*  --  8.4*  HCT 35.3*  --   --   --  26.5*  --  25.7*  --  26.3*  PLT 407*  --   --   --  278  --  261  --  253  APTT 39*  --   --   --   --   --   --   --   --   LABPROT 16.6*  --   --   --   --   --   --   --   --   INR 1.3*  --   --   --   --   --   --   --   --   HEPARINUNFRC  --   --   --   --  <0.10*   < > 0.33 0.49 0.46  CREATININE 1.49*   < >  --   --  1.25*  --  1.23  --  1.20  TROPONINIHS 3,329*   < > 4,413* 6,544* 8,310*  --   --   --   --    < > = values in this interval not displayed.     Estimated Creatinine Clearance: 51.4 mL/min (by C-G formula based on SCr of 1.2 mg/dL).  Assessment: 80 y.o. male with Afib/ACS for heparin.  Heparin level therapeutic x 2 consecutive checks.   HL 0.46- therapeutic Hgb 8.4  Goal of Therapy:  Heparin level 0.3-0.7 units/ml Monitor platelets by anticoagulation protocol: Yes   Plan:  Continue Heparin at 1500 units/hr F/U HL in am  Margot Ables, PharmD Clinical Pharmacist 03/23/2021 8:37 AM

## 2021-03-24 DIAGNOSIS — A4181 Sepsis due to Enterococcus: Secondary | ICD-10-CM | POA: Diagnosis not present

## 2021-03-24 DIAGNOSIS — C3490 Malignant neoplasm of unspecified part of unspecified bronchus or lung: Secondary | ICD-10-CM | POA: Diagnosis not present

## 2021-03-24 DIAGNOSIS — E871 Hypo-osmolality and hyponatremia: Secondary | ICD-10-CM | POA: Diagnosis not present

## 2021-03-24 DIAGNOSIS — N1832 Chronic kidney disease, stage 3b: Secondary | ICD-10-CM | POA: Diagnosis not present

## 2021-03-24 DIAGNOSIS — I213 ST elevation (STEMI) myocardial infarction of unspecified site: Secondary | ICD-10-CM | POA: Diagnosis not present

## 2021-03-24 LAB — GLUCOSE, CAPILLARY
Glucose-Capillary: 147 mg/dL — ABNORMAL HIGH (ref 70–99)
Glucose-Capillary: 182 mg/dL — ABNORMAL HIGH (ref 70–99)
Glucose-Capillary: 173 mg/dL — ABNORMAL HIGH (ref 70–99)
Glucose-Capillary: 113 mg/dL — ABNORMAL HIGH (ref 70–99)
Glucose-Capillary: 131 mg/dL — ABNORMAL HIGH (ref 70–99)

## 2021-03-24 LAB — PHOSPHORUS: Phosphorus: 3.1 mg/dL (ref 2.5–4.6)

## 2021-03-24 LAB — CBC
HCT: 26 % — ABNORMAL LOW (ref 39.0–52.0)
Hemoglobin: 8.5 g/dL — ABNORMAL LOW (ref 13.0–17.0)
MCH: 25.2 pg — ABNORMAL LOW (ref 26.0–34.0)
MCHC: 32.7 g/dL (ref 30.0–36.0)
MCV: 77.2 fL — ABNORMAL LOW (ref 80.0–100.0)
Platelets: 214 10*3/uL (ref 150–400)
RBC: 3.37 MIL/uL — ABNORMAL LOW (ref 4.22–5.81)
RDW: 15.6 % — ABNORMAL HIGH (ref 11.5–15.5)
WBC: 8.5 10*3/uL (ref 4.0–10.5)
nRBC: 0 % (ref 0.0–0.2)

## 2021-03-24 LAB — BASIC METABOLIC PANEL
Anion gap: 4 — ABNORMAL LOW (ref 5–15)
BUN: 9 mg/dL (ref 8–23)
CO2: 21 mmol/L — ABNORMAL LOW (ref 22–32)
Calcium: 6.9 mg/dL — ABNORMAL LOW (ref 8.9–10.3)
Chloride: 105 mmol/L (ref 98–111)
Creatinine, Ser: 1.22 mg/dL (ref 0.61–1.24)
GFR, Estimated: 60 mL/min — ABNORMAL LOW (ref >60)
Glucose, Bld: 164 mg/dL — ABNORMAL HIGH (ref 70–99)
Potassium: 3.8 mmol/L (ref 3.5–5.1)
Sodium: 130 mmol/L — ABNORMAL LOW (ref 135–145)

## 2021-03-24 LAB — URINALYSIS, ROUTINE W REFLEX MICROSCOPIC
Bilirubin Urine: NEGATIVE
Glucose, UA: 50 mg/dL — AB
Ketones, ur: NEGATIVE mg/dL
Nitrite: NEGATIVE
Protein, ur: NEGATIVE mg/dL
RBC / HPF: >50 RBC/hpf — ABNORMAL HIGH (ref 0–5)
Specific Gravity, Urine: 1.011 (ref 1.005–1.030)
pH: 6 (ref 5.0–8.0)

## 2021-03-24 LAB — HEPARIN LEVEL (UNFRACTIONATED): Heparin Unfractionated: 0.42 IU/mL (ref 0.30–0.70)

## 2021-03-24 LAB — MAGNESIUM: Magnesium: 1.7 mg/dL (ref 1.7–2.4)

## 2021-03-24 MED ORDER — MAGNESIUM SULFATE 2 GM/50ML IV SOLN
2.0000 g | Freq: Once | INTRAVENOUS | Status: AC
  Administered 2021-03-24: 2 g via INTRAVENOUS
  Filled 2021-03-24: qty 50

## 2021-03-24 MED ORDER — DM-GUAIFENESIN ER 30-600 MG PO TB12
1.0000 | ORAL_TABLET | Freq: Two times a day (BID) | ORAL | Status: DC
  Administered 2021-03-24 – 2021-03-30 (×13): 1 via ORAL
  Filled 2021-03-24 (×13): qty 1

## 2021-03-24 MED ORDER — DM-GUAIFENESIN ER 30-600 MG PO TB12: 1.0000 | ORAL_TABLET | Freq: Two times a day (BID) | ORAL | Status: DC

## 2021-03-24 NOTE — Progress Notes (Signed)
Patient didn't eat breakfast & family reported poor appetite but patient & family reported that he has been eating oreo cookies & other snacks, encouraged patient to get out of bed up to chair but patient declined at this time, able to take his PO meds whole without difficulty

## 2021-03-24 NOTE — Progress Notes (Signed)
Patient called and c/o chest pain. Began after having a coughing fit while attempting to bring up thick phlegm. Pain is 10/10 described as pressure in upper right and left chest area. SL nitro given x 2 with resolution of symptoms. EKG obtained. MD Adefeso notified.

## 2021-03-24 NOTE — Progress Notes (Signed)
ANTICOAGULATION CONSULT NOTE  Pharmacy Consult for Heparin Indication: chest pain/ACS and atrial fibrillation  Allergies  Allergen Reactions   Oxycodone     Patient Measurements: Height: 5\' 11"  (180.3 cm) Weight: 83.5 kg (184 lb 1.4 oz) IBW/kg (Calculated) : 75.3 Heparin Dosing Weight: 78.5 kg  Vital Signs: Temp: 97.2 F (36.2 C) (11/08 0448) Temp Source: Axillary (11/08 0448) BP: 109/49 (11/08 0600) Pulse Rate: 99 (11/08 0600)  Labs: Recent Labs    03/22/21 0140 03/22/21 1014 03/23/21 0458 03/24/21 0421  HGB 8.3*  --  8.4* 8.5*  HCT 25.7*  --  26.3* 26.0*  PLT 261  --  253 214  HEPARINUNFRC 0.33 0.49 0.46 0.42  CREATININE 1.23  --  1.20 1.22     Estimated Creatinine Clearance: 50.6 mL/min (by C-G formula based on SCr of 1.22 mg/dL).  Assessment: 80 y.o. male with Afib/ACS for heparin.  Heparin level therapeutic x 2 consecutive checks.   HL 0.42- therapeutic Hgb 8.5  Goal of Therapy:  Heparin level 0.3-0.7 units/ml Monitor platelets by anticoagulation protocol: Yes   Plan:  Continue Heparin at 1500 units/hr F/U HL in am  Thomasenia Sales, PharmD, MBA, BCGP Clinical Pharmacist  03/24/2021 7:33 AM

## 2021-03-24 NOTE — Progress Notes (Signed)
Progress Note  Patient Name: [name] Date of Encounter: 03/24/2021  [facility] Cardiologist: Kate Sable, MD (Inactive)   Subjective   No complaints  Inpatient Medications    Scheduled Meds:  aspirin EC  81 mg Oral Daily   atorvastatin  80 mg Oral Daily   budesonide (PULMICORT) nebulizer solution  0.5 mg Nebulization BID   Chlorhexidine Gluconate Cloth  6 each Topical Daily   clopidogrel  75 mg Oral Daily   dextromethorphan-guaiFENesin  1 tablet Oral BID   escitalopram  10 mg Oral Daily   insulin aspart  0-5 Units Subcutaneous QHS   insulin aspart  0-9 Units Subcutaneous TID WC   insulin glargine-yfgn  5 Units Subcutaneous Daily   ipratropium  0.5 mg Nebulization TID   levalbuterol  0.63 mg Nebulization TID   metoprolol tartrate  12.5 mg Oral BID   phosphorus  500 mg Oral BID   ranolazine  500 mg Oral BID   tamsulosin  0.4 mg Oral QPC supper   Continuous Infusions:  ampicillin (OMNIPEN) IV Stopped (03/24/21 0630)   cefTRIAXone (ROCEPHIN)  IV Stopped (03/23/21 2217)   heparin 1,500 Units/hr (03/24/21 0700)   nitroGLYCERIN Stopped (03/23/21 2145)   PRN Meds: acetaminophen **OR** acetaminophen, levalbuterol, nitroGLYCERIN, ondansetron **OR** ondansetron (ZOFRAN) IV, polyethylene glycol   Vital Signs    Vitals:   03/24/21 0505 03/24/21 0533 03/24/21 0600 03/24/21 0700  BP: 121/71  (!) 109/49   Pulse: (!) 115 95 99   Resp: (!) 31 (!) 25 (!) 23   Temp:    97.6 F (36.4 C)  TempSrc:    Oral  SpO2: 93% 91% 90%   Weight:      Height:        Intake/Output Summary (Last 24 hours) at 03/24/2021 0810 Last data filed at 03/24/2021 0700 Gross per 24 hour  Intake 1685.55 ml  Output 2375 ml  Net -689.45 ml   Last 3 Weights 03/24/2021 03/23/2021 03/22/2021  Weight (lbs) 184 lb 1.4 oz 182 lb 1.6 oz 172 lb 2.9 oz  Weight (kg) 83.5 kg 82.6 kg 78.1 kg      Telemetry    afib - Personally Reviewed  ECG    N/a - Personally Reviewed  Physical Exam    GEN: No acute distress.   Neck: No JVD Cardiac: RRR, no murmurs, rubs, or gallops.  Respiratory: bilaterla wheezing.  GI: Soft, nontender, non-distended  MS: No edema; No deformity. Neuro:  Nonfocal  Psych: Normal affect   Labs    High Sensitivity Troponin:   Recent Labs  Lab 03/20/21 2004 03/20/21 2210 03/21/21 0017 03/21/21 0236 03/21/21 0418  TROPONINIHS 3,016* 3,762* 4,413* 6,544* 8,310*     Chemistry Recent Labs  Lab 03/20/21 2210 03/21/21 0418 03/22/21 0140 03/23/21 0458 03/24/21 0421  NA 127*   < > 128* 127* 130*  K 3.3*   < > 3.2* 3.8 3.8  CL 98   < > 102 103 105  CO2 17*   < > 20* 19* 21*  GLUCOSE 218*   < > 195* 200* 164*  BUN 14   < > 15 11 9   CREATININE 1.34*   < > 1.23 1.20 1.22  CALCIUM 7.1*   < > 7.1* 6.9* 6.9*  MG  --   --   --  1.8 1.7  PROT 6.4*  --  5.7* 5.7*  --   ALBUMIN 2.0*  --  1.7* 1.6*  --   AST 27  --  40 21  --   ALT 12  --  14 11  --   ALKPHOS 100  --  80 74  --   BILITOT 1.1  --  0.6 0.6  --   GFRNONAA 53*   < > 59* >60 60*  ANIONGAP 12   < > 6 5 4*   < > = values in this interval not displayed.    Lipids No results for input(s): CHOL, TRIG, HDL, LABVLDL, LDLCALC, CHOLHDL in the last 168 hours.  Hematology Recent Labs  Lab 03/22/21 0140 03/23/21 0458 03/24/21 0421  WBC 9.4 8.7 8.5  RBC 3.28* 3.35* 3.37*  HGB 8.3* 8.4* 8.5*  HCT 25.7* 26.3* 26.0*  MCV 78.4* 78.5* 77.2*  MCH 25.3* 25.1* 25.2*  MCHC 32.3 31.9 32.7  RDW 15.3 15.5 15.6*  PLT 261 253 214   Thyroid No results for input(s): TSH, FREET4 in the last 168 hours.  BNPNo results for input(s): BNP, PROBNP in the last 168 hours.  DDimer No results for input(s): DDIMER in the last 168 hours.   Radiology    CT ABDOMEN PELVIS WO CONTRAST  Result Date: 03/22/2021 CLINICAL DATA:  Sepsis, leukocytosis bacteremia. Concern for abdominal infection EXAM: CT ABDOMEN AND PELVIS WITHOUT CONTRAST TECHNIQUE: Multidetector CT imaging of the abdomen and pelvis was performed  following the standard protocol without IV contrast. COMPARISON:  CT July 23, 2020 FINDINGS: Lower chest: Small bilateral pleural effusions with right greater than left basilar airspace consolidations. Prior median sternotomy coronary artery calcifications. Hepatobiliary: Unremarkable noncontrast appearance of the hepatic parenchyma. Gallbladder surgically absent. No biliary ductal dilation. Pancreas: No pancreatic ductal dilation or evidence of acute inflammation. Pancreatic atrophy. Spleen: Within normal limits. Adrenals/Urinary Tract: Left greater than right cortical renal atrophy and scarring. Massive distension of the urinary bladder. Prominence of the right ureter and collecting system. No ureteral stone visualized. No bladder calculus. Stomach/Bowel: Stomach is unremarkable for degree of distension. No pathologic dilation of small or large bowel. Radiopaque enteric contrast traverses the rectum. No evidence of acute bowel inflammation. Vascular/Lymphatic: Aortic and Hanae Waiters vessel atherosclerosis. No abdominal aortic aneurysm. No pathologically enlarged abdominal or pelvic lymph nodes. Reproductive: Dystrophic prostatic calcifications. Stable penile calcifications. Other: Trace abdominopelvic free fluid.  No pneumoperitoneum. Musculoskeletal: Multilevel degenerative changes spine. No acute osseous abnormality. IMPRESSION: 1. Massive distension of the urinary bladder with prominence of the right ureter and collecting system, likely related to back pressure. Consider in and out catheterization if patient is unable to void. 2. Small bilateral pleural effusions with right greater than left basilar airspace consolidations, concerning for pneumonia with parapneumonic effusion. 3. Trace abdominopelvic free fluid. 4. Aortic Atherosclerosis (ICD10-I70.0). Electronically Signed   By: Dahlia Bailiff M.D.   On: 03/22/2021 17:41    Cardiac Studies    Patient Profile     [name] is a 80 y.o. male with CAD s/p  2008 CABG with multi-vessel graft disease referred for redo CABG in 2019, HTN with DM, metastatic lung cancer on dabrafenib and tametinib, and normocytic anemia who is being seen 03/23/2021 for the evaluation of NSTEMI, PNA, Bacteremia .  Assessment & Plan    1.CAD/STEMI/ACute systolic HF - history of CABG in 2008 - cath 2019 showed severe native 3 vessel disease, patent LIMA-LAD, 99% SVG-Diag, multilple high grade stenosis SVG-OM and SVG-RCA. From cath note PCI of limited benefit given multiple vein graft lesions in old SVGs. Patient was evaluated for redo CABG however turned down due to metastatic cancer history.  -  presentd with inferior STEMI, patient refused transfer and cath on admission. - given his prior anatomy likely poor revasc options  - trop up to 8300 without established peak - echo LVE 09-73%, normal diastolic, normal RV. Mild MR. Inferior hypokinesis.   -medically managed STEMI. He is on ASA 81, atorva 80, plavix 75, hep gtt, lopressor 12.5mg  bid. Soft bp's at times, have not started ACE/ARB/ARNI/aldactone. COnsolidated beta blocker to toprol once hemodynamics further stabilize. Bp's stabilizing, may be able to adjust meds tomorrow.    2.Metastatic lung cancer   3.Bacteremia/Severe sepsis - being treated for bacteremia and pneumonia by primary team - primary team mention TEE when patient more stable. Off O2 but ongoing significant wheezing, consider TEE later in the week.   4. Afib - new diagnosis this admission in setting of STEMI - on lopressor 12.5mg  bid - CHADS2Vasc score is 6 (CHF, HTN, age x 2, DM2, CAD). Currently on heparin gtt, transition to eliquis - transition to eliquis when outside ICU setting. History of metastatic lung CA by no brain mets by imaging. - rates overall controlled.    4.DNR/DNI - palliative following patient.   For questions or updates, please contact [place] Please consult [email or web address] for contact info under         Signed, Carlyle Dolly, MD  03/24/2021, 8:10 AM

## 2021-03-24 NOTE — Progress Notes (Signed)
Patient reported nitro effective & no c/o chest discomfort at this time, patient reported his chest doesn't bother his as long as he is stretched out in bed

## 2021-03-24 NOTE — Progress Notes (Addendum)
PROGRESS NOTE  [name] EQA:[id number] DOB: 10/07/1940 DOA: 03/20/2021 PCP: [name], MD  Brief History:  80 year old male with a history of stage IV lung adenocarcinoma, CKD stage III, hypertension, diabetes mellitus type 2, coronary artery disease status post CABG presenting with 2-week history of chest pressure and shortness of breath.  He states that the chest pressure was initially intermittent, but has become constant with worsening shortness of breath.  He has had increasing generalized weakness and decreased oral intake.  He has had some subjective fevers and chills.  He denies any headache, neck pain, mopped assist but complains of of a nonproductive cough. In the emergency department, the patient was afebrile and tachycardic.  Oxygen saturation was in the low 90s on 4 L nasal cannula.  Heart rate ranging from the 130s to 150s, and respirations ranging from the 20s to low 40s.  CMP notable for hyponatremia to 124, potassium 2.8, chloride 89, glucose 237, creatinine 1.49, calcium 8.1; CBC notable for white count of 19.5, remainder unremarkable.  Initial troponin was 3329, initial lactic acid was 2.9 and down trended to 2.1 on recheck.  COVID and influenza swabs were negative.  Chest x-ray was concerning for possible bilateral lower lobe opacities possibly representing infection.  Initial EKG showed some ST depressions but no clear STEMI, on repeat newly evident STEMI was apparent in leads II, III, and aVF, with reciprocal changes seen in the anterior leads. Patient was started on broad-spectrum antibiotics, heparin drip, and IV fluids.  ED provider consulted with cardiology, who agreed with STEMI diagnosis but did not recommend Cath Lab activation at this time given critical illness, however did recommend transfer to [facility] for further treatment and stabilization.  However, at lease 2 separate providers including myself have discussed with the patient regarding risk/benefits of  transfer to [facility] [place], and the pt continues to confirm that he wishes to stay at [facility].      Assessment/Plan: Severe sepsis -Present on admission -Presented with tachycardia, hypoxia, leukocytosis, elevated lactic acid -due to bacteremia/pneumonia -Lactic acid peaked 2.9>> 1.2 -Initially on cefepime and vancomycin pending culture data -Check PCT--1.08 -now on ampicillin/ceftriaxone -sepsis physiology resolved   STEMI -Personally reviewed EKG 11/4--ST elevation in lateral leads -Continue IV heparin -Continue IV nitroglycerin--stopped 11/8 -Continue statin -11/5 echo EF 35-40%, septal/apical/mid basal HK, mild MR/TR -Continue aspirin/plavix -01/09/2021 echo EF 55 to 60%, no WMA, normal RV, mild MR -appreciate cardiology consult -continue atorvastatin -Ranexa added   E faecalis bacteremia with critical illness  -11/4 blood culture--E faecalis -repeat Blood culture--neg to date -will ultimately need TEE when stable on RA -urine culture was not collected despite prior order -d/c cefepime/metronidazole -continue empiric vanc>>ampicillin plus ceftriaxone  -goal is to treat for 5-7 days if no vegetation seen on TEE  Lobar pneumonia -Check PCT 1.08 -Continue cefepime>>ampicillin -MRSA negative   Acute respiratory failure with hypoxia -Stable on 3 L>>weaned to 2L>>RA -Wean oxygen for saturation greater 92%   Paroxysmal Afib -continue IV heparin -CHADSVASc = 6 -had short episodes 11/4 evening and at time of admission -low dose metoprolol   CKD stage IIIb -Baseline creatinine 1.2-1.5 -Monitor   Abdominal pain/Urinary Retention/Hematuria -CT abd/pelvis--massive distended urinary bladder with prominence of the right ureter and collecting system; bibasilar infiltrates with small bilateral pleural effusions, R>left; no bowel inflammation or obstruction -s/p insertion of Foley catheter 11/7 -hematuria was likely due to bladder distension -hematuria resolved with foley  insertion -Advanced diet  Metastatic lung adenocarcinoma -Patient has been on dabrafenib and trametinib -Holding immunotherapy in the setting of sepsis -Outpatient follow-up with Dr. Delton Coombes   Diabetes mellitus type 2 -11/6--hemoglobin A1c--7.3 -NovoLog sliding scale -Holding Amaryl and metformin -Holding Actos -start low dose semglee   Essential hypertension -Holding amlodipine -restart low dose metoprolol   Hyponatremia -Secondary to volume depletion initially -Judicious IV fluids>>saline lock   Hypophosphatemia -replete   Goals of care -Patient confirms DNR/DNI -Advance care planning, including the explanation and discussion of advance directives was carried out with the patient and family.  Code status including explanations of "Full Code" and "DNR" and alternatives were discussed in detail.  Discussion of end-of-life issues including but not limited palliative care, hospice care and the concept of hospice, other end-of-life care options, power of attorney for health care decisions, living wills, and physician orders for life-sustaining treatment were also discussed with the patient and family.  Total face to face time 16 minutes.               Status is: Inpatient   Remains inpatient appropriate because: severity of illness requiring IV heparin and IV abx               Family Communication:  spouse update at bedside 11/8   Consultants:  none   Code Status:   DNR   DVT Prophylaxis:  IV Heparin      Procedures: As Listed in Progress Note Above   Antibiotics: Cefepime 114>>11/6 Vanc 11/4>>11/6 Ampicillin 11/6>> Ceftriaxone 11/6>>     Subjective: Patient denies fevers, chills, headache, chest pain, dyspnea, nausea, vomiting, diarrhea, abdominal pain, dysuria, hematuria, hematochezia, and melena.   Objective: Vitals:   03/24/21 0533 03/24/21 0600 03/24/21 0700 03/24/21 1133  BP:  (!) 109/49    Pulse: 95 99    Resp: (!) 25 (!) 23    Temp:    97.6 F (36.4 C) (!) 97 F (36.1 C)  TempSrc:   Oral Axillary  SpO2: 91% 90%    Weight:      Height:        Intake/Output Summary (Last 24 hours) at 03/24/2021 1212 Last data filed at 03/24/2021 0700 Gross per 24 hour  Intake 1509.55 ml  Output 2375 ml  Net -865.45 ml   Weight change: 0.9 kg Exam:  General:  Pt is alert, follows commands appropriately, not in acute distress HEENT: No icterus, No thrush, No neck mass, [place]/AT Cardiovascular: IRRR, S1/S2, no rubs, no gallops Respiratory: bibasilar rales.  No wheeze Abdomen: Soft/+BS, non tender, non distended, no guarding Extremities: trace LE edema, No lymphangitis, No petechiae, No rashes, no synovitis   Data Reviewed: I have personally reviewed following labs and imaging studies Basic Metabolic Panel: Recent Labs  Lab 03/20/21 2210 03/21/21 0418 03/22/21 0140 03/23/21 0458 03/24/21 0421  NA 127* 128* 128* 127* 130*  K 3.3* 3.3* 3.2* 3.8 3.8  CL 98 102 102 103 105  CO2 17* 19* 20* 19* 21*  GLUCOSE 218* 143* 195* 200* 164*  BUN 14 13 15 11 9   CREATININE 1.34* 1.25* 1.23 1.20 1.22  CALCIUM 7.1* 7.0* 7.1* 6.9* 6.9*  MG  --   --   --  1.8 1.7  PHOS  --   --   --  2.1* 3.1   Liver Function Tests: Recent Labs  Lab 03/20/21 1757 03/20/21 2210 03/22/21 0140 03/23/21 0458  AST 32 27 40 21  ALT 14 12 14 11   ALKPHOS 130* 100 80 74  BILITOT 1.1 1.1 0.6 0.6  PROT 8.3* 6.4* 5.7* 5.7*  ALBUMIN 2.6* 2.0* 1.7* 1.6*   No results for input(s): LIPASE, AMYLASE in the last 168 hours. No results for input(s): AMMONIA in the last 168 hours. Coagulation Profile: Recent Labs  Lab 03/20/21 1757  INR 1.3*   CBC: Recent Labs  Lab 03/20/21 1757 03/21/21 0418 03/22/21 0140 03/23/21 0458 03/24/21 0421  WBC 19.5* 16.0* 9.4 8.7 8.5  NEUTROABS 16.6*  --   --   --   --   HGB 11.6* 8.9* 8.3* 8.4* 8.5*  HCT 35.3* 26.5* 25.7* 26.3* 26.0*  MCV 75.8* 75.7* 78.4* 78.5* 77.2*  PLT 407* 278 261 253 214   Cardiac Enzymes: No  results for input(s): CKTOTAL, CKMB, CKMBINDEX, TROPONINI in the last 168 hours. BNP: Invalid input(s): POCBNP CBG: Recent Labs  Lab 03/23/21 1639 03/23/21 2105 03/24/21 0446 03/24/21 0733 03/24/21 1117  GLUCAP 173* 147* 147* 182* 173*   HbA1C: Recent Labs    03/22/21 0213  HGBA1C 7.3*   Urine analysis:    Component Value Date/Time   COLORURINE YELLOW 03/24/2021 0500   APPEARANCEUR HAZY (A) 03/24/2021 0500   LABSPEC 1.011 03/24/2021 0500   PHURINE 6.0 03/24/2021 0500   GLUCOSEU 50 (A) 03/24/2021 0500   HGBUR LARGE (A) 03/24/2021 0500   BILIRUBINUR NEGATIVE 03/24/2021 0500   KETONESUR NEGATIVE 03/24/2021 0500   PROTEINUR NEGATIVE 03/24/2021 0500   UROBILINOGEN 0.2 05/05/2007 1658   NITRITE NEGATIVE 03/24/2021 0500   LEUKOCYTESUR TRACE (A) 03/24/2021 0500   Sepsis Labs: @LABRCNTIP (procalcitonin:4,lacticidven:4) ) Recent Results (from the past 240 hour(s))  Blood Culture (routine x 2)     Status: None (Preliminary result)   Collection Time: 03/20/21  5:57 PM   Specimen: BLOOD LEFT ARM  Result Value Ref Range Status   Specimen Description BLOOD LEFT ARM  Final   Special Requests   Final    BOTTLES DRAWN AEROBIC ONLY Blood Culture adequate volume   Culture   Final    NO GROWTH 4 DAYS Performed at [facility], [address], [place], [place] [address]    Report Status PENDING  Incomplete  Blood Culture (routine x 2)     Status: Abnormal   Collection Time: 03/20/21  5:57 PM   Specimen: Right Antecubital; Blood  Result Value Ref Range Status   Specimen Description   Final    RIGHT ANTECUBITAL Performed at [facility], [address], [place], [place] [address]    Special Requests   Final    BOTTLES DRAWN AEROBIC AND ANAEROBIC Blood Culture results may not be optimal due to an inadequate volume of blood received in culture bottles Performed at [facility], [address] [place], [place] [address]    Culture  Setup Time   Final    IN BOTH AEROBIC AND ANAEROBIC  BOTTLES GRAM POSITIVE COCCI CRITICAL RESULT CALLED TO, READ BACK BY AND VERIFIED WITH: MILLS,M 1629 03/21/2021 COLEMAN,R CRITICAL RESULT CALLED TO, READ BACK BY AND VERIFIED WITH: RN T.ALLEN ON [id number] AT 2023 BY E.PARRISH Performed at [facility], [place] [address] [place], [place] [address]    Culture ENTEROCOCCUS FAECALIS (A)  Final   Report Status 03/23/2021 FINAL  Final   Organism ID, Bacteria ENTEROCOCCUS FAECALIS  Final      Susceptibility   Enterococcus faecalis - MIC*    AMPICILLIN <=2 SENSITIVE Sensitive     VANCOMYCIN <=0.5 SENSITIVE Sensitive     GENTAMICIN SYNERGY SENSITIVE Sensitive     *  ENTEROCOCCUS FAECALIS  Blood Culture ID Panel (Reflexed)     Status: Abnormal   Collection Time: 03/20/21  5:57 PM  Result Value Ref Range Status   Enterococcus faecalis DETECTED (A) NOT DETECTED Final    Comment: CRITICAL RESULT CALLED TO, READ BACK BY AND VERIFIED WITH: RN T.ALLEN ON [id number] AT 2023 BY E.PARRISH    Enterococcus Faecium NOT DETECTED NOT DETECTED Final   Listeria monocytogenes NOT DETECTED NOT DETECTED Final   Staphylococcus species NOT DETECTED NOT DETECTED Final   Staphylococcus aureus (BCID) NOT DETECTED NOT DETECTED Final   Staphylococcus epidermidis NOT DETECTED NOT DETECTED Final   Staphylococcus lugdunensis NOT DETECTED NOT DETECTED Final   Streptococcus species NOT DETECTED NOT DETECTED Final   Streptococcus agalactiae NOT DETECTED NOT DETECTED Final   Streptococcus pneumoniae NOT DETECTED NOT DETECTED Final   Streptococcus pyogenes NOT DETECTED NOT DETECTED Final   A.calcoaceticus-baumannii NOT DETECTED NOT DETECTED Final   Bacteroides fragilis NOT DETECTED NOT DETECTED Final   Enterobacterales NOT DETECTED NOT DETECTED Final   Enterobacter cloacae complex NOT DETECTED NOT DETECTED Final   Escherichia coli NOT DETECTED NOT DETECTED Final   Klebsiella aerogenes NOT DETECTED NOT DETECTED Final   Klebsiella oxytoca NOT DETECTED NOT DETECTED Final    Klebsiella pneumoniae NOT DETECTED NOT DETECTED Final   Proteus species NOT DETECTED NOT DETECTED Final   Salmonella species NOT DETECTED NOT DETECTED Final   Serratia marcescens NOT DETECTED NOT DETECTED Final   Haemophilus influenzae NOT DETECTED NOT DETECTED Final   Neisseria meningitidis NOT DETECTED NOT DETECTED Final   Pseudomonas aeruginosa NOT DETECTED NOT DETECTED Final   Stenotrophomonas maltophilia NOT DETECTED NOT DETECTED Final   Candida albicans NOT DETECTED NOT DETECTED Final   Candida auris NOT DETECTED NOT DETECTED Final   Candida glabrata NOT DETECTED NOT DETECTED Final   Candida krusei NOT DETECTED NOT DETECTED Final   Candida parapsilosis NOT DETECTED NOT DETECTED Final   Candida tropicalis NOT DETECTED NOT DETECTED Final   Cryptococcus neoformans/gattii NOT DETECTED NOT DETECTED Final   Vancomycin resistance NOT DETECTED NOT DETECTED Final    Comment: Performed at [facility], [address], [place], [place] [address]  Resp Panel by RT-PCR (Flu A&B, Covid) Nasopharyngeal Swab     Status: None   Collection Time: 03/20/21  6:25 PM   Specimen: Nasopharyngeal Swab; Nasopharyngeal(NP) swabs in vial transport medium  Result Value Ref Range Status   SARS Coronavirus 2 by RT PCR NEGATIVE NEGATIVE Final    Comment: (NOTE) SARS-CoV-2 target nucleic acids are NOT DETECTED.  The SARS-CoV-2 RNA is generally detectable in upper respiratory specimens during the acute phase of infection. The lowest concentration of SARS-CoV-2 viral copies this assay can detect is 138 copies/mL. A negative result does not preclude SARS-Cov-2 infection and should not be used as the sole basis for treatment or other patient management decisions. A negative result may occur with  improper specimen collection/handling, submission of specimen other than nasopharyngeal swab, presence of viral mutation(s) within the areas targeted by this assay, and inadequate number of viral copies(<138  copies/mL). A negative result must be combined with clinical observations, patient history, and epidemiological information. The expected result is Negative.  Fact Sheet for Patients:  EntrepreneurPulse.com.au  Fact Sheet for Healthcare Providers:  IncredibleEmployment.be  This test is no t yet approved or cleared by the [place] FDA and  has been authorized for detection and/or diagnosis of SARS-CoV-2 by FDA under an Emergency Use Authorization (EUA). This EUA  will remain  in effect (meaning this test can be used) for the duration of the COVID-19 declaration under Section 564(b)(1) of the Act, 21 U.S.C.section 360bbb-3(b)(1), unless the authorization is terminated  or revoked sooner.       Influenza A by PCR NEGATIVE NEGATIVE Final   Influenza B by PCR NEGATIVE NEGATIVE Final    Comment: (NOTE) The Xpert Xpress SARS-CoV-2/FLU/RSV plus assay is intended as an aid in the diagnosis of influenza from Nasopharyngeal swab specimens and should not be used as a sole basis for treatment. Nasal washings and aspirates are unacceptable for Xpert Xpress SARS-CoV-2/FLU/RSV testing.  Fact Sheet for Patients: EntrepreneurPulse.com.au  Fact Sheet for Healthcare Providers: IncredibleEmployment.be  This test is not yet approved or cleared by the [place] FDA and has been authorized for detection and/or diagnosis of SARS-CoV-2 by FDA under an Emergency Use Authorization (EUA). This EUA will remain in effect (meaning this test can be used) for the duration of the COVID-19 declaration under Section 564(b)(1) of the Act, 21 U.S.C. section 360bbb-3(b)(1), unless the authorization is terminated or revoked.  Performed at [facility], [address], [place], [place] [address]   MRSA Next Gen by PCR, Nasal     Status: None   Collection Time: 03/20/21 11:37 PM   Specimen: Nasal Mucosa; Nasal Swab  Result Value Ref  Range Status   MRSA by PCR Next Gen NOT DETECTED NOT DETECTED Final    Comment: (NOTE) The GeneXpert MRSA Assay (FDA approved for NASAL specimens only), is one component of a comprehensive MRSA colonization surveillance program. It is not intended to diagnose MRSA infection nor to guide or monitor treatment for MRSA infections. Test performance is not FDA approved in patients less than 31 years old. Performed at [facility], [address] [place], [place] [address]   Urine Culture     Status: Abnormal   Collection Time: 03/22/21  8:27 AM   Specimen: Urine, Clean Catch  Result Value Ref Range Status   Specimen Description   Final    URINE, CLEAN CATCH Performed at [facility], [address] [place], [place] [address]    Special Requests   Final    NONE Performed at [facility], [address], [place], [place] [address]    Culture MULTIPLE SPECIES PRESENT, SUGGEST RECOLLECTION (A)  Final   Report Status 03/23/2021 FINAL  Final  Culture, blood (Routine X 2) w Reflex to ID Panel     Status: None (Preliminary result)   Collection Time: 03/22/21 12:07 PM   Specimen: BLOOD RIGHT ARM  Result Value Ref Range Status   Specimen Description BLOOD RIGHT ARM BOTTLES DRAWN AEROBIC ONLY  Final   Special Requests Blood Culture adequate volume  Final   Culture   Final    NO GROWTH 2 DAYS Performed at [facility], [address], [place], [place] [address]    Report Status PENDING  Incomplete  Culture, blood (Routine X 2) w Reflex to ID Panel     Status: None (Preliminary result)   Collection Time: 03/22/21 12:22 PM   Specimen: BLOOD RIGHT HAND  Result Value Ref Range Status   Specimen Description BLOOD RIGHT HAND BOTTLES DRAWN AEROBIC ONLY  Final   Special Requests Blood Culture adequate volume  Final   Culture   Final    NO GROWTH 2 DAYS Performed at [facility], [address] [place],  [address]    Report Status PENDING  Incomplete     Scheduled Meds:  aspirin EC  81 mg  Oral Daily   atorvastatin  80 mg Oral Daily   budesonide (PULMICORT) nebulizer solution  0.5 mg Nebulization BID   Chlorhexidine Gluconate Cloth  6 each Topical Daily   clopidogrel  75 mg Oral Daily   dextromethorphan-guaiFENesin  1 tablet Oral BID   escitalopram  10 mg Oral Daily   insulin aspart  0-5 Units Subcutaneous QHS   insulin aspart  0-9 Units Subcutaneous TID WC   insulin glargine-yfgn  5 Units Subcutaneous Daily   ipratropium  0.5 mg Nebulization TID   levalbuterol  0.63 mg Nebulization TID   metoprolol tartrate  12.5 mg Oral BID   phosphorus  500 mg Oral BID   ranolazine  500 mg Oral BID   tamsulosin  0.4 mg Oral QPC supper   Continuous Infusions:  ampicillin (OMNIPEN) IV Stopped (03/24/21 0630)   cefTRIAXone (ROCEPHIN)  IV 2 g (03/24/21 1001)   heparin 1,500 Units/hr (03/24/21 0700)   nitroGLYCERIN Stopped (03/23/21 2145)    Procedures/Studies: CT ABDOMEN PELVIS WO CONTRAST  Result Date: 03/22/2021 CLINICAL DATA:  Sepsis, leukocytosis bacteremia. Concern for abdominal infection EXAM: CT ABDOMEN AND PELVIS WITHOUT CONTRAST TECHNIQUE: Multidetector CT imaging of the abdomen and pelvis was performed following the standard protocol without IV contrast. COMPARISON:  CT July 23, 2020 FINDINGS: Lower chest: Small bilateral pleural effusions with right greater than left basilar airspace consolidations. Prior median sternotomy coronary artery calcifications. Hepatobiliary: Unremarkable noncontrast appearance of the hepatic parenchyma. Gallbladder surgically absent. No biliary ductal dilation. Pancreas: No pancreatic ductal dilation or evidence of acute inflammation. Pancreatic atrophy. Spleen: Within normal limits. Adrenals/Urinary Tract: Left greater than right cortical renal atrophy and scarring. Massive distension of the urinary bladder. Prominence of the right ureter and collecting system. No ureteral stone visualized. No bladder calculus. Stomach/Bowel: Stomach is unremarkable for  degree of distension. No pathologic dilation of small or large bowel. Radiopaque enteric contrast traverses the rectum. No evidence of acute bowel inflammation. Vascular/Lymphatic: Aortic and branch vessel atherosclerosis. No abdominal aortic aneurysm. No pathologically enlarged abdominal or pelvic lymph nodes. Reproductive: Dystrophic prostatic calcifications. Stable penile calcifications. Other: Trace abdominopelvic free fluid.  No pneumoperitoneum. Musculoskeletal: Multilevel degenerative changes spine. No acute osseous abnormality. IMPRESSION: 1. Massive distension of the urinary bladder with prominence of the right ureter and collecting system, likely related to back pressure. Consider in and out catheterization if patient is unable to void. 2. Small bilateral pleural effusions with right greater than left basilar airspace consolidations, concerning for pneumonia with parapneumonic effusion. 3. Trace abdominopelvic free fluid. 4. Aortic Atherosclerosis (ICD10-I70.0). Electronically Signed   By: Dahlia Bailiff M.D.   On: 03/22/2021 17:41   DG Chest Port 1 View  Result Date: 03/20/2021 CLINICAL DATA:  Questionable sepsis. EXAM: PORTABLE CHEST 1 VIEW COMPARISON:  Chest x-ray 12/27/2017. FINDINGS: Patient is status post cardiac surgery. There are atherosclerotic calcifications of the aorta. There are new reticulonodular opacities in both lower lungs, right greater than left. Costophrenic angles are clear. There is no pneumothorax. No acute fractures are seen. IMPRESSION: 1. Reticulonodular opacities in the bilateral lung bases, right greater than left. Findings may be related to increasing pulmonary nodules and/or superimposed infection. Electronically Signed   By: Ronney Asters M.D.   On: 03/20/2021 18:38   ECHOCARDIOGRAM COMPLETE  Result Date: 03/21/2021    ECHOCARDIOGRAM REPORT   Patient Name:   RILEY HALLUM Date of Exam: 03/21/2021 Medical Rec #:  [id number]     Height:  71.0 in Accession #:     [id number]    Weight:       169.5 lb Date of Birth:  [birth year]     BSA:          1.966 m Patient Age:    15 years      BP:           1417/63 mmHg Patient Gender: M             HR:           67 bpm. Exam Location:  [facility] Procedure: 2D Echo, Cardiac Doppler and Color Doppler Indications:    STEMI (ST elevation myocardial infarction) [facility]) [[id number]]  History:        Patient has prior history of Echocardiogram examinations, most                 recent 01/09/2021. CAD; Risk Factors:Diabetes and Hypertension.  Sonographer:    Bernadene Person RDCS Referring Phys: [id number] [place]  1. Distal septal, apical mid and basal inferior wall hypokinesis . Left ventricular ejection fraction, by estimation, is 35 to 40%. The left ventricle has moderately decreased function. The left ventricle demonstrates regional wall motion abnormalities (see scoring diagram/findings for description). Left ventricular diastolic parameters were normal.  2. Right ventricular systolic function is normal. The right ventricular size is normal. There is moderately elevated pulmonary artery systolic pressure.  3. Left atrial size was moderately dilated.  4. 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 The mitral valve is abnormal. Mild mitral valve regurgitation. No evidence of mitral stenosis.  5. The aortic valve is tricuspid. There is mild calcification of the aortic valve. Aortic valve regurgitation is not visualized. Mild aortic valve sclerosis is present, with no evidence of aortic valve stenosis.  6. The inferior vena cava is normal in size with greater than 50% respiratory variability, suggesting right atrial pressure of 3 mmHg. FINDINGS  Left Ventricle: Distal septal, apical mid and basal inferior wall hypokinesis. Left ventricular ejection fraction, by estimation, is 35 to 40%. The left ventricle has moderately decreased function. The left ventricle demonstrates regional wall motion abnormalities. The left ventricular internal cavity size was  normal in size. There is no left ventricular hypertrophy. Left ventricular diastolic parameters were normal. Right Ventricle: The right ventricular size is normal. No increase in right ventricular wall thickness. Right ventricular systolic function is normal. There is moderately elevated pulmonary artery systolic pressure. The tricuspid regurgitant velocity is 3.29 m/s, and with an assumed right atrial pressure of 8 mmHg, the estimated right ventricular systolic pressure is 67.6 mmHg. Left Atrium: Left atrial size was moderately dilated. Right Atrium: Right atrial size was normal in size. Pericardium: There is no evidence of pericardial effusion. 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 The mitral valve is abnormal. There is mild thickening of the mitral valve leaflet(s). There is mild calcification of the mitral valve leaflet(s). Mild mitral valve regurgitation. No evidence of mitral valve stenosis. Tricuspid Valve: The tricuspid valve is normal in structure. Tricuspid valve regurgitation is mild . No evidence of tricuspid stenosis. Aortic Valve: The aortic valve is tricuspid. There is mild calcification of the aortic valve. Aortic valve regurgitation is not visualized. Mild aortic valve sclerosis is present, with no evidence of aortic valve stenosis. Pulmonic Valve: The pulmonic valve was normal in structure. Pulmonic valve regurgitation is trivial. No evidence of pulmonic stenosis. Aorta: The aortic root is normal in size and structure. Venous: The inferior vena cava is normal in size  with greater than 50% respiratory variability, suggesting right atrial pressure of 3 mmHg. IAS/Shunts: The interatrial septum was not assessed.  LEFT VENTRICLE PLAX 2D LVIDd:         5.40 cm LVIDs:         3.80 cm LV PW:         1.30 cm LV IVS:        0.80 cm LVOT diam:     2.00 cm LV SV:         51 LV SV Index:   26 LVOT Area:     3.14 cm  LV Volumes (MOD) LV vol d, MOD A2C: 110.0 ml LV vol d, MOD A4C: 97.8 ml LV vol s, MOD A2C: 76.2 ml  LV vol s, MOD A4C: 62.3 ml LV SV MOD A2C:     33.8 ml LV SV MOD A4C:     97.8 ml LV SV MOD BP:      36.7 ml RIGHT VENTRICLE RV S prime:     7.15 cm/s TAPSE (M-mode): 1.2 cm LEFT ATRIUM             Index        RIGHT ATRIUM           Index LA diam:        4.30 cm 2.19 cm/m   RA Area:     15.20 cm LA Vol (A2C):   53.1 ml 27.02 ml/m  RA Volume:   37.90 ml  19.28 ml/m LA Vol (A4C):   46.2 ml 23.50 ml/m LA Biplane Vol: 50.0 ml 25.44 ml/m  AORTIC VALVE LVOT Vmax:   83.60 cm/s LVOT Vmean:  54.100 cm/s LVOT VTI:    0.163 m  AORTA Ao Root diam: 3.70 cm Ao Asc diam:  3.40 cm MR Peak grad: 115.3 mmHg  TRICUSPID VALVE MR Mean grad: 80.0 mmHg   TR Peak grad:   43.3 mmHg MR Vmax:      537.00 cm/s TR Vmax:        329.00 cm/s MR Vmean:     435.0 cm/s                           SHUNTS                           Systemic VTI:  0.16 m                           Systemic Diam: 2.00 cm Jenkins Rouge MD Electronically signed by Jenkins Rouge MD Signature Date/Time: 03/21/2021/1:57:44 PM    Final     Orson Eva, DO  [facility]  If 7PM-7AM, please contact night-coverage [email or web address] Password [email or web address] 03/24/2021, 12:12 PM   LOS: 4 days

## 2021-03-24 NOTE — Progress Notes (Signed)
Patient reported that he is hungry but each time he attempts to eat he becomes nauseated, dinner tray present & family at bedside reported that they have brought the patient food as well, denied any chest pain/pressure/tightness or heaviness at this time, PRN Zofran given @1730 , no emesis at this time, O2 SAT 97% RA, noted audible wheezing & abdominal breathing, patient reported thick sputum that he is having a hard time getting up & expelling, RT notified with request to given patient PRN Xopenex

## 2021-03-24 NOTE — Progress Notes (Signed)
34 Patient's son Eddie Dibbles) just reported that the patient is c/o some chest discomfort & requested nitro be given to the patient; nitro given at 1806; zofran effective

## 2021-03-25 DIAGNOSIS — I213 ST elevation (STEMI) myocardial infarction of unspecified site: Secondary | ICD-10-CM | POA: Diagnosis not present

## 2021-03-25 DIAGNOSIS — C3490 Malignant neoplasm of unspecified part of unspecified bronchus or lung: Secondary | ICD-10-CM | POA: Diagnosis not present

## 2021-03-25 DIAGNOSIS — R7881 Bacteremia: Secondary | ICD-10-CM | POA: Diagnosis not present

## 2021-03-25 DIAGNOSIS — N1831 Chronic kidney disease, stage 3a: Secondary | ICD-10-CM | POA: Diagnosis not present

## 2021-03-25 DIAGNOSIS — Z7189 Other specified counseling: Secondary | ICD-10-CM | POA: Diagnosis not present

## 2021-03-25 DIAGNOSIS — B952 Enterococcus as the cause of diseases classified elsewhere: Secondary | ICD-10-CM | POA: Diagnosis not present

## 2021-03-25 DIAGNOSIS — Z515 Encounter for palliative care: Secondary | ICD-10-CM | POA: Diagnosis not present

## 2021-03-25 DIAGNOSIS — I4891 Unspecified atrial fibrillation: Secondary | ICD-10-CM | POA: Diagnosis not present

## 2021-03-25 LAB — CULTURE, BLOOD (ROUTINE X 2)
Culture: NO GROWTH
Special Requests: ADEQUATE

## 2021-03-25 LAB — BASIC METABOLIC PANEL
Anion gap: 8 (ref 5–15)
BUN: 8 mg/dL (ref 8–23)
CO2: 22 mmol/L (ref 22–32)
Calcium: 6.9 mg/dL — ABNORMAL LOW (ref 8.9–10.3)
Chloride: 103 mmol/L (ref 98–111)
Creatinine, Ser: 1.22 mg/dL (ref 0.61–1.24)
GFR, Estimated: 60 mL/min — ABNORMAL LOW (ref >60)
Glucose, Bld: 111 mg/dL — ABNORMAL HIGH (ref 70–99)
Potassium: 3.3 mmol/L — ABNORMAL LOW (ref 3.5–5.1)
Sodium: 133 mmol/L — ABNORMAL LOW (ref 135–145)

## 2021-03-25 LAB — CBC
HCT: 24.1 % — ABNORMAL LOW (ref 39.0–52.0)
Hemoglobin: 8.1 g/dL — ABNORMAL LOW (ref 13.0–17.0)
MCH: 25.1 pg — ABNORMAL LOW (ref 26.0–34.0)
MCHC: 33.6 g/dL (ref 30.0–36.0)
MCV: 74.6 fL — ABNORMAL LOW (ref 80.0–100.0)
Platelets: 209 10*3/uL (ref 150–400)
RBC: 3.23 MIL/uL — ABNORMAL LOW (ref 4.22–5.81)
RDW: 15.4 % (ref 11.5–15.5)
WBC: 6.6 10*3/uL (ref 4.0–10.5)
nRBC: 0 % (ref 0.0–0.2)

## 2021-03-25 LAB — HEPARIN LEVEL (UNFRACTIONATED): Heparin Unfractionated: 0.64 IU/mL (ref 0.30–0.70)

## 2021-03-25 LAB — MAGNESIUM: Magnesium: 2 mg/dL (ref 1.7–2.4)

## 2021-03-25 LAB — PHOSPHORUS: Phosphorus: 4.4 mg/dL (ref 2.5–4.6)

## 2021-03-25 LAB — GLUCOSE, CAPILLARY
Glucose-Capillary: 113 mg/dL — ABNORMAL HIGH (ref 70–99)
Glucose-Capillary: 126 mg/dL — ABNORMAL HIGH (ref 70–99)
Glucose-Capillary: 146 mg/dL — ABNORMAL HIGH (ref 70–99)
Glucose-Capillary: 164 mg/dL — ABNORMAL HIGH (ref 70–99)

## 2021-03-25 MED ORDER — SPIRONOLACTONE 25 MG PO TABS
12.5000 mg | ORAL_TABLET | Freq: Every day | ORAL | Status: DC
  Administered 2021-03-25 – 2021-03-30 (×6): 12.5 mg via ORAL
  Filled 2021-03-25 (×2): qty 1
  Filled 2021-03-25 (×3): qty 0.5
  Filled 2021-03-25: qty 1
  Filled 2021-03-25: qty 0.5
  Filled 2021-03-25: qty 1
  Filled 2021-03-25: qty 0.5
  Filled 2021-03-25: qty 1
  Filled 2021-03-25 (×3): qty 0.5

## 2021-03-25 MED ORDER — METOPROLOL SUCCINATE ER 25 MG PO TB24
25.0000 mg | ORAL_TABLET | Freq: Every day | ORAL | Status: DC
  Administered 2021-03-25 – 2021-03-30 (×6): 25 mg via ORAL
  Filled 2021-03-25 (×6): qty 1

## 2021-03-25 MED ORDER — APIXABAN 5 MG PO TABS
5.0000 mg | ORAL_TABLET | Freq: Two times a day (BID) | ORAL | Status: DC
  Administered 2021-03-25 – 2021-03-30 (×11): 5 mg via ORAL
  Filled 2021-03-25 (×9): qty 1
  Filled 2021-03-25: qty 2
  Filled 2021-03-25: qty 1

## 2021-03-25 MED ORDER — LOSARTAN POTASSIUM 25 MG PO TABS
12.5000 mg | ORAL_TABLET | Freq: Every day | ORAL | Status: DC
Start: 2021-03-25 — End: 2021-03-25

## 2021-03-25 MED ORDER — POTASSIUM CHLORIDE CRYS ER 20 MEQ PO TBCR
40.0000 meq | EXTENDED_RELEASE_TABLET | Freq: Once | ORAL | Status: AC
Start: 2021-03-25 — End: 2021-03-25
  Administered 2021-03-25: 40 meq via ORAL
  Filled 2021-03-25: qty 2

## 2021-03-25 NOTE — Progress Notes (Signed)
Called report to [place], patient transferring to room 337 tele

## 2021-03-25 NOTE — Progress Notes (Addendum)
Progress Note  Patient Name: [name] Date of Encounter: 03/25/2021  [facility] Cardiologist: Kate Sable, MD (Inactive)   Subjective   Denies any chest pain.  Has some SOB but improved  Inpatient Medications    Scheduled Meds:  aspirin EC  81 mg Oral Daily   atorvastatin  80 mg Oral Daily   budesonide (PULMICORT) nebulizer solution  0.5 mg Nebulization BID   Chlorhexidine Gluconate Cloth  6 each Topical Daily   clopidogrel  75 mg Oral Daily   dextromethorphan-guaiFENesin  1 tablet Oral BID   escitalopram  10 mg Oral Daily   insulin aspart  0-5 Units Subcutaneous QHS   insulin aspart  0-9 Units Subcutaneous TID WC   insulin glargine-yfgn  5 Units Subcutaneous Daily   ipratropium  0.5 mg Nebulization TID   levalbuterol  0.63 mg Nebulization TID   metoprolol tartrate  12.5 mg Oral BID   phosphorus  500 mg Oral BID   ranolazine  500 mg Oral BID   tamsulosin  0.4 mg Oral QPC supper   Continuous Infusions:  ampicillin (OMNIPEN) IV 2 g (03/25/21 2706)   cefTRIAXone (ROCEPHIN)  IV 2 g (03/24/21 2125)   heparin 1,500 Units/hr (03/25/21 0828)   PRN Meds: acetaminophen **OR** acetaminophen, levalbuterol, nitroGLYCERIN, ondansetron **OR** ondansetron (ZOFRAN) IV, polyethylene glycol   Vital Signs    Vitals:   03/25/21 0600 03/25/21 0734 03/25/21 0802 03/25/21 0803  BP: (!) 130/46     Pulse: 87 88    Resp: 20 (!) 24    Temp:  97.7 F (36.5 C)    TempSrc:  Axillary    SpO2: 92% 94% 96% 100%  Weight:      Height:        Intake/Output Summary (Last 24 hours) at 03/25/2021 2376 Last data filed at 03/25/2021 0600 Gross per 24 hour  Intake 1017.07 ml  Output 1000 ml  Net 17.07 ml    Last 3 Weights 03/25/2021 03/24/2021 03/23/2021  Weight (lbs) 217 lb 6 oz 184 lb 1.4 oz 182 lb 1.6 oz  Weight (kg) 98.6 kg 83.5 kg 82.6 kg      Telemetry    Atrial Fibrillation with CVR - Personally Reviewed  ECG    No new EKGs to review- Personally Reviewed  Physical  Exam   GEN: Well nourished, well developed in no acute distress HEENT: Normal NECK: No JVD; No carotid bruits LYMPHATICS: No lymphadenopathy CARDIAC:irregularly irregular, no murmurs, rubs, gallops RESPIRATORY:  Clear to auscultation without rales, wheezing or rhonchi  ABDOMEN: Soft, non-tender, non-distended MUSCULOSKELETAL:  1+ BLE edema; No deformity  SKIN: Warm and dry NEUROLOGIC:  Alert and oriented x 3 PSYCHIATRIC:  Normal affect   Labs    High Sensitivity Troponin:   Recent Labs  Lab 03/20/21 2004 03/20/21 2210 03/21/21 0017 03/21/21 0236 03/21/21 0418  TROPONINIHS 3,016* 3,762* 4,413* 6,544* 8,310*      Chemistry Recent Labs  Lab 03/20/21 2210 03/21/21 0418 03/22/21 0140 03/23/21 0458 03/24/21 0421 03/25/21 0413  NA 127*   < > 128* 127* 130* 133*  K 3.3*   < > 3.2* 3.8 3.8 3.3*  CL 98   < > 102 103 105 103  CO2 17*   < > 20* 19* 21* 22  GLUCOSE 218*   < > 195* 200* 164* 111*  BUN 14   < > 15 11 9 8   CREATININE 1.34*   < > 1.23 1.20 1.22 1.22  CALCIUM 7.1*   < >  7.1* 6.9* 6.9* 6.9*  MG  --   --   --  1.8 1.7 2.0  PROT 6.4*  --  5.7* 5.7*  --   --   ALBUMIN 2.0*  --  1.7* 1.6*  --   --   AST 27  --  40 21  --   --   ALT 12  --  14 11  --   --   ALKPHOS 100  --  80 74  --   --   BILITOT 1.1  --  0.6 0.6  --   --   GFRNONAA 53*   < > 59* >60 60* 60*  ANIONGAP 12   < > 6 5 4* 8   < > = values in this interval not displayed.     Lipids No results for input(s): CHOL, TRIG, HDL, LABVLDL, LDLCALC, CHOLHDL in the last 168 hours.  Hematology Recent Labs  Lab 03/23/21 0458 03/24/21 0421 03/25/21 0413  WBC 8.7 8.5 6.6  RBC 3.35* 3.37* 3.23*  HGB 8.4* 8.5* 8.1*  HCT 26.3* 26.0* 24.1*  MCV 78.5* 77.2* 74.6*  MCH 25.1* 25.2* 25.1*  MCHC 31.9 32.7 33.6  RDW 15.5 15.6* 15.4  PLT 253 214 209    Thyroid No results for input(s): TSH, FREET4 in the last 168 hours.  BNPNo results for input(s): BNP, PROBNP in the last 168 hours.  DDimer No results for  input(s): DDIMER in the last 168 hours.   Radiology    No results found.  Cardiac Studies    Patient Profile     [name] is a 80 y.o. 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  Assessment & Plan    1.CAD/STEMI - history of CABG in 2008 - cath 2019 showed severe native 3 vessel disease, patent LIMA-LAD, 99% SVG-Diag, multilple high grade stenosis SVG-OM and SVG-RCA. From cath note PCI of limited benefit given multiple vein graft lesions in old SVGs. Patient was evaluated for redo CABG however turned down due to metastatic cancer history.  - presented with inferior STEMI, patient refused transfer and cath on admission. - given his prior anatomy likely poor revasc options - trop up to 8300 without established peak - echo LVE 08-67%, normal diastolic, normal RV. Mild MR. Inferior hypokinesis.  - continue medical management of STEMI - denies any chest pain  - continue ASA 81mg  daily, Plavix 75mg  daily, IV Heparin gtt, Atorvastatin 80mg  daily and Ranexa 500mg  BID - change Lopressor to Toprol XL 25mg  daily - change IV Heparin gtt to Eliquis 5mg  BID once ok with TRH - would stop ASA once starting Eliquis and continue Plavix  2.Metastatic lung cancer -per TRH  3.Bacteremia/Severe sepsis - being treated for bacteremia and pneumonia by primary team - Off O2 but ongoing significant wheezing, consider TEE later in the week to rule out endocarditis   4. New onset atrial fibrillation with CVR - new diagnosis this admission in setting of STEMI - HR is well controlled on exam today - consolidate Lopressor to Toprol XL 25mg  daily - CHADS2Vasc score is 6 (CHF, HTN, age x 2, DM2, CAD).  - Continue IV Heparin gtt and transition to Eliquis 5mg  BID when ok with TRH - History of  metastatic lung CA by no brain mets by imaging. -  TRH had requested TEE later in the week for bacteremia, so could consider TEE/DCCV at that time if back on Eliquis  5.  Acute systolic CHF -related to LV dysfunction with EF 35-40% and severe hypoalbuminemia>>LV dysfunction new in setting of STEMI -he put out 1L yesterday and is net positive 5.8L -weight up 33lbs but suspect this is likely not accurate -SCr stable at 1.22 and K+ 3.3 -replete K+  -currently not on diuretics -his lungs are clear but has some mild LE edema >>likely 3rd spacing from low albumin -continue BB -BP improved >> consider addition of low dose Losartan 12.5mg  daily is BP remains stable with consolidating Lopressor to Toprol>>BP likely will not tolerate Entresto at this time -add low dose spiro 12.5mg  daily which will help with Hypokalemia -followed renal function, daily weights and I&O's  6 .DNR/DNI - palliative following patient.  7.  Hypokalemia -K+ 3.3 today -currently not on diuretics -will give Kdur 47meq this am -repeat BMET in am -Mag ok at 2 today  8.  Wide complex tachycardia -9 beats yesterday evening -K+ low at 3.3 -replete K+ -continue BB -Mag 2 today  The patient is critically ill with multiple organ systems failure and requires high complexity decision making for assessment and support, frequent evaluation and titration of therapies, application of advanced monitoring technologies and extensive interpretation of multiple databases. Critical Care Time devoted to patient care services described in this note independent of APP time is 40 minutes with >50% of time spent in direct patient care.    For questions or updates, please contact [place] Please consult [email or web address] for contact info under        Signed, Fransico Him, MD  03/25/2021, 9:07 AM

## 2021-03-25 NOTE — Progress Notes (Signed)
[place] for Heparin >> apixaban Indication: chest pain/ACS and atrial fibrillation  Allergies  Allergen Reactions   Oxycodone     Patient Measurements: Height: 5\' 11"  (180.3 cm) Weight: 98.6 kg (217 lb 6 oz) IBW/kg (Calculated) : 75.3 Heparin Dosing Weight: 78.5 kg  Vital Signs: Temp: 97.7 F (36.5 C) (11/09 0734) Temp Source: Axillary (11/09 0734) BP: 130/46 (11/09 0600) Pulse Rate: 88 (11/09 0734)  Labs: Recent Labs    03/23/21 0458 03/24/21 0421 03/25/21 0413  HGB 8.4* 8.5* 8.1*  HCT 26.3* 26.0* 24.1*  PLT 253 214 209  HEPARINUNFRC 0.46 0.42 0.64  CREATININE 1.20 1.22 1.22     Estimated Creatinine Clearance: 56.8 mL/min (by C-G formula based on SCr of 1.22 mg/dL).  Assessment: 80 y.o. male with Afib/ACS for heparin.  Heparin level therapeutic x 2 consecutive checks.  Hgb 8.1  Goal of Therapy:   Monitor platelets by anticoagulation protocol: Yes   Plan:  Stop heparin Start apixaban 5 mg twice daily. Monitor H&H and s/s of bleeding.  Margot Ables, PharmD Clinical Pharmacist 03/25/2021 11:33 AM

## 2021-03-25 NOTE — Plan of Care (Addendum)
  Problem: Acute Rehab PT Goals(only PT should resolve) Goal: Pt Will Go Supine/Side To Sit Outcome: Progressing Flowsheets (Taken 03/25/2021 1552) Pt will go Supine/Side to Sit:  with minimal assist  with min guard assist Goal: Patient Will Transfer Sit To/From Stand Outcome: Progressing Flowsheets (Taken 03/25/2021 1552) Patient will transfer sit to/from stand:  with minimal assist  with moderate assist Goal: Pt Will Ambulate Outcome: Progressing Flowsheets (Taken 03/25/2021 1552) Pt will Ambulate:  10 feet  with rolling walker  with minimal assist  with moderate assist   Cassie Jones, SPT  During this treatment session, the therapist was present, participating in and directing the treatment.  4:03 PM, 03/25/21 Lonell Grandchild, MPT Physical Therapist with [facility] [phone] office [phone] mobile phone

## 2021-03-25 NOTE — Progress Notes (Signed)
PROGRESS NOTE   Anthony BOWSHER  GUY:[id number] DOB: 05-Jul-1940 DOA: 03/20/2021 PCP: [name], MD   Chief Complaint  Patient presents with   Fever   Level of care: ICU  Brief Admission History:  80 y.o. male with hx of metastatic lung cancer currently on palliative immunotherapy, CAD status post four-vessel CABG, CKD, type 2 diabetes, who presents with chest pain.  Patient reports that he has felt unwell for several days.  His chest has been hurting but since just prior to presenting to the ED he has felt like an elephant is sitting on his chest.  He also feels very short of breath currently.  Also notes having a significant cough as well as being very weak.  Has not had much to eat or drink in the past couple of days either.  He was admitted with STEMI that is being managed medically.    Assessment & Plan:   Active Problems:   Type 2 diabetes mellitus without complication, without long-term current use of insulin (HCC)   CKD (chronic kidney disease) stage 3, GFR 30-59 ml/min (HCC)   S/P CABG x 4   Adenocarcinoma of lung, stage 4 (HCC)   STEMI (ST elevation myocardial infarction) ([place])   Hyponatremia   Sepsis ([place])   Sepsis due to Enterococcus ([place])   Bacteremia due to Enterococcus   Atrial fibrillation with rapid ventricular response ([place])   STEMI - Pt is being medically managed.   - He completed a course of IV heparin and nitroglycerin - continue statin therapy.  - continue plavix; DC ASA when starting apixaban - Echo 01/09/21: EF 55-60%, no WMA, normal RV, mild MR - ranexa BID - cardiology team following.   Severe Sepsis - RESOLVED -Sepsis physiology is resolved now -Secondary to E faecalis bacteremia and pneumonia -Lactic acidosis has resolved -Continue IV antibiotics as ordered  E faecalis bacteremia -Repeated blood cultures have been no growth to date -Planning for TEE when okay with cardiology team -Continue IV ampicillin as ordered  Lobar pneumonia  - treated  with cefepime, now on ampicillin -MRSA negative   Acute respiratory failure with hypoxia -Weaned to room air if possible  Paroxysmal atrial fibrillation -Okay with cardiology to transition to apixaban today -Consult to Pharm.D. for apixaban, DC aspirin, continue Plavix -Bleeding precautions -CHA2DS2-VASc score equals 6 -Continue metoprolol for rate control  Stage IIIb CKD -Stable  Acute urinary retention -Foley catheter in place for decompression  Metastatic lung adenocarcinoma -He is followed closely by Dr. Delton Coombes and currently holding immunotherapy in setting of acute infection -Outpatient follow-up with Dr. Delton Coombes has been scheduled for 11/28  Type 2 diabetes mellitus with vascular complications -Uncontrolled as evidenced by hemoglobin A1c of 7.3% -Holding home Amaryl and metformin and Actos -Currently being managed with low-dose glargine CBG (last 3)  Recent Labs    03/24/21 1649 03/24/21 1951 03/25/21 0735  GLUCAP 113* 131* 113*  ' Essential hypertension -Controlled on current medications continue to follow  Hyponatremia -Improving  Hypophosphatemia -This is been repleted  DNR present on admission -Patient wishes to continue DNR orders inpatient -Appreciate palliative medicine team for goals of care discussion   DVT prophylaxis: Apixaban Code Status: Full Family Communication: Phone call update Disposition: Anticipate home in 2 to 3-days Status is: Inpatient  Remains inpatient appropriate because: He was on IV heparin infusion and still awaiting inpatient TEE as part of work-up for bacteremia    Consultants:  Cardiology Palliative medicine  Procedures:  Tentatively awaiting TEE  Antimicrobials:  Subjective: Patient reports overall feeling weak but no chest pain and occasional shortness of breath with productive cough  Objective: Vitals:   03/25/21 0600 03/25/21 0734 03/25/21 0802 03/25/21 0803  BP: (!) 130/46     Pulse: 87 88     Resp: 20 (!) 24    Temp:  97.7 F (36.5 C)    TempSrc:  Axillary    SpO2: 92% 94% 96% 100%  Weight:      Height:        Intake/Output Summary (Last 24 hours) at 03/25/2021 1130 Last data filed at 03/25/2021 0600 Gross per 24 hour  Intake 1017.07 ml  Output 1000 ml  Net 17.07 ml   Filed Weights   03/23/21 0441 03/24/21 0448 03/25/21 0424  Weight: 82.6 kg 83.5 kg 98.6 kg    Examination:  General exam: Chronically ill-appearing male lying in the bed he is awake and alert in no apparent distress appears calm and comfortable  Respiratory system: Upper anterior congestion noises heard. Respiratory effort normal. Cardiovascular system: normal S1 & S2 heard. No JVD, murmurs, rubs, gallops or clicks. No pedal edema. Gastrointestinal system: Abdomen is nondistended, soft and nontender. No organomegaly or masses felt. Normal bowel sounds heard. Central nervous system: Alert and oriented. No focal neurological deficits. Extremities: Symmetric 5 x 5 power. Skin: No rashes, lesions or ulcers Psychiatry: Judgement and insight appear poor. Mood & affect appropriate.   Data Reviewed: I have personally reviewed following labs and imaging studies  CBC: Recent Labs  Lab 03/20/21 1757 03/21/21 0418 03/22/21 0140 03/23/21 0458 03/24/21 0421 03/25/21 0413  WBC 19.5* 16.0* 9.4 8.7 8.5 6.6  NEUTROABS 16.6*  --   --   --   --   --   HGB 11.6* 8.9* 8.3* 8.4* 8.5* 8.1*  HCT 35.3* 26.5* 25.7* 26.3* 26.0* 24.1*  MCV 75.8* 75.7* 78.4* 78.5* 77.2* 74.6*  PLT 407* 278 261 253 214 619    Basic Metabolic Panel: Recent Labs  Lab 03/21/21 0418 03/22/21 0140 03/23/21 0458 03/24/21 0421 03/25/21 0413  NA 128* 128* 127* 130* 133*  K 3.3* 3.2* 3.8 3.8 3.3*  CL 102 102 103 105 103  CO2 19* 20* 19* 21* 22  GLUCOSE 143* 195* 200* 164* 111*  BUN 13 15 11 9 8   CREATININE 1.25* 1.23 1.20 1.22 1.22  CALCIUM 7.0* 7.1* 6.9* 6.9* 6.9*  MG  --   --  1.8 1.7 2.0  PHOS  --   --  2.1* 3.1 4.4     GFR: Estimated Creatinine Clearance: 56.8 mL/min (by C-G formula based on SCr of 1.22 mg/dL).  Liver Function Tests: Recent Labs  Lab 03/20/21 1757 03/20/21 2210 03/22/21 0140 03/23/21 0458  AST 32 27 40 21  ALT 14 12 14 11   ALKPHOS 130* 100 80 74  BILITOT 1.1 1.1 0.6 0.6  PROT 8.3* 6.4* 5.7* 5.7*  ALBUMIN 2.6* 2.0* 1.7* 1.6*    CBG: Recent Labs  Lab 03/24/21 0733 03/24/21 1117 03/24/21 1649 03/24/21 1951 03/25/21 0735  GLUCAP 182* 173* 113* 131* 113*    Recent Results (from the past 240 hour(s))  Blood Culture (routine x 2)     Status: None   Collection Time: 03/20/21  5:57 PM   Specimen: BLOOD LEFT ARM  Result Value Ref Range Status   Specimen Description BLOOD LEFT ARM  Final   Special Requests   Final    BOTTLES DRAWN AEROBIC ONLY Blood Culture adequate volume   Culture   Final  NO GROWTH 5 DAYS Performed at [facility], [address] [place], [place] [address]    Report Status 03/25/2021 FINAL  Final  Blood Culture (routine x 2)     Status: Abnormal   Collection Time: 03/20/21  5:57 PM   Specimen: Right Antecubital; Blood  Result Value Ref Range Status   Specimen Description   Final    RIGHT ANTECUBITAL Performed at [facility], [address] [place], [place] [address]    Special Requests   Final    BOTTLES DRAWN AEROBIC AND ANAEROBIC Blood Culture results may not be optimal due to an inadequate volume of blood received in culture bottles Performed at [facility], [address] [place], [place] [address]    Culture  Setup Time   Final    IN BOTH AEROBIC AND ANAEROBIC BOTTLES GRAM POSITIVE COCCI CRITICAL RESULT CALLED TO, READ BACK BY AND VERIFIED WITH: MILLS,M 1629 03/21/2021 COLEMAN,R CRITICAL RESULT CALLED TO, READ BACK BY AND VERIFIED WITH: RN T.ALLEN ON [id number] AT 2023 BY E.PARRISH Performed at [facility], [place] [address] [place], [place] [address]    Culture ENTEROCOCCUS FAECALIS (A)  Final   Report Status 03/23/2021 FINAL   Final   Organism ID, Bacteria ENTEROCOCCUS FAECALIS  Final      Susceptibility   Enterococcus faecalis - MIC*    AMPICILLIN <=2 SENSITIVE Sensitive     VANCOMYCIN <=0.5 SENSITIVE Sensitive     GENTAMICIN SYNERGY SENSITIVE Sensitive     * ENTEROCOCCUS FAECALIS  Blood Culture ID Panel (Reflexed)     Status: Abnormal   Collection Time: 03/20/21  5:57 PM  Result Value Ref Range Status   Enterococcus faecalis DETECTED (A) NOT DETECTED Final    Comment: CRITICAL RESULT CALLED TO, READ BACK BY AND VERIFIED WITH: RN T.ALLEN ON [id number] AT 2023 BY E.PARRISH    Enterococcus Faecium NOT DETECTED NOT DETECTED Final   Listeria monocytogenes NOT DETECTED NOT DETECTED Final   Staphylococcus species NOT DETECTED NOT DETECTED Final   Staphylococcus aureus (BCID) NOT DETECTED NOT DETECTED Final   Staphylococcus epidermidis NOT DETECTED NOT DETECTED Final   Staphylococcus lugdunensis NOT DETECTED NOT DETECTED Final   Streptococcus species NOT DETECTED NOT DETECTED Final   Streptococcus agalactiae NOT DETECTED NOT DETECTED Final   Streptococcus pneumoniae NOT DETECTED NOT DETECTED Final   Streptococcus pyogenes NOT DETECTED NOT DETECTED Final   A.calcoaceticus-baumannii NOT DETECTED NOT DETECTED Final   Bacteroides fragilis NOT DETECTED NOT DETECTED Final   Enterobacterales NOT DETECTED NOT DETECTED Final   Enterobacter cloacae complex NOT DETECTED NOT DETECTED Final   Escherichia coli NOT DETECTED NOT DETECTED Final   Klebsiella aerogenes NOT DETECTED NOT DETECTED Final   Klebsiella oxytoca NOT DETECTED NOT DETECTED Final   Klebsiella pneumoniae NOT DETECTED NOT DETECTED Final   Proteus species NOT DETECTED NOT DETECTED Final   Salmonella species NOT DETECTED NOT DETECTED Final   Serratia marcescens NOT DETECTED NOT DETECTED Final   Haemophilus influenzae NOT DETECTED NOT DETECTED Final   Neisseria meningitidis NOT DETECTED NOT DETECTED Final   Pseudomonas aeruginosa NOT DETECTED NOT DETECTED  Final   Stenotrophomonas maltophilia NOT DETECTED NOT DETECTED Final   Candida albicans NOT DETECTED NOT DETECTED Final   Candida auris NOT DETECTED NOT DETECTED Final   Candida glabrata NOT DETECTED NOT DETECTED Final   Candida krusei NOT DETECTED NOT DETECTED Final   Candida parapsilosis NOT DETECTED NOT DETECTED Final   Candida tropicalis NOT DETECTED NOT DETECTED Final  Cryptococcus neoformans/gattii NOT DETECTED NOT DETECTED Final   Vancomycin resistance NOT DETECTED NOT DETECTED Final    Comment: Performed at [facility], [place] [address], [place], [place] [address]  Resp Panel by RT-PCR (Flu A&B, Covid) Nasopharyngeal Swab     Status: None   Collection Time: 03/20/21  6:25 PM   Specimen: Nasopharyngeal Swab; Nasopharyngeal(NP) swabs in vial transport medium  Result Value Ref Range Status   SARS Coronavirus 2 by RT PCR NEGATIVE NEGATIVE Final    Comment: (NOTE) SARS-CoV-2 target nucleic acids are NOT DETECTED.  The SARS-CoV-2 RNA is generally detectable in upper respiratory specimens during the acute phase of infection. The lowest concentration of SARS-CoV-2 viral copies this assay can detect is 138 copies/mL. A negative result does not preclude SARS-Cov-2 infection and should not be used as the sole basis for treatment or other patient management decisions. A negative result may occur with  improper specimen collection/handling, submission of specimen other than nasopharyngeal swab, presence of viral mutation(s) within the areas targeted by this assay, and inadequate number of viral copies(<138 copies/mL). A negative result must be combined with clinical observations, patient history, and epidemiological information. The expected result is Negative.  Fact Sheet for Patients:  EntrepreneurPulse.com.au  Fact Sheet for Healthcare Providers:  IncredibleEmployment.be  This test is no t yet approved or cleared by the [place] FDA  and  has been authorized for detection and/or diagnosis of SARS-CoV-2 by FDA under an Emergency Use Authorization (EUA). This EUA will remain  in effect (meaning this test can be used) for the duration of the COVID-19 declaration under Section 564(b)(1) of the Act, 21 U.S.C.section 360bbb-3(b)(1), unless the authorization is terminated  or revoked sooner.       Influenza A by PCR NEGATIVE NEGATIVE Final   Influenza B by PCR NEGATIVE NEGATIVE Final    Comment: (NOTE) The Xpert Xpress SARS-CoV-2/FLU/RSV plus assay is intended as an aid in the diagnosis of influenza from Nasopharyngeal swab specimens and should not be used as a sole basis for treatment. Nasal washings and aspirates are unacceptable for Xpert Xpress SARS-CoV-2/FLU/RSV testing.  Fact Sheet for Patients: EntrepreneurPulse.com.au  Fact Sheet for Healthcare Providers: IncredibleEmployment.be  This test is not yet approved or cleared by the [place] FDA and has been authorized for detection and/or diagnosis of SARS-CoV-2 by FDA under an Emergency Use Authorization (EUA). This EUA will remain in effect (meaning this test can be used) for the duration of the COVID-19 declaration under Section 564(b)(1) of the Act, 21 U.S.C. section 360bbb-3(b)(1), unless the authorization is terminated or revoked.  Performed at [facility], [address], [place], [place] [address]   MRSA Next Gen by PCR, Nasal     Status: None   Collection Time: 03/20/21 11:37 PM   Specimen: Nasal Mucosa; Nasal Swab  Result Value Ref Range Status   MRSA by PCR Next Gen NOT DETECTED NOT DETECTED Final    Comment: (NOTE) The GeneXpert MRSA Assay (FDA approved for NASAL specimens only), is one component of a comprehensive MRSA colonization surveillance program. It is not intended to diagnose MRSA infection nor to guide or monitor treatment for MRSA infections. Test performance is not FDA approved in patients  less than 52 years old. Performed at [facility], [address] [place], [place] [address]   Urine Culture     Status: Abnormal   Collection Time: 03/22/21  8:27 AM   Specimen: Urine, Clean Catch  Result Value Ref Range Status  Specimen Description   Final    URINE, CLEAN CATCH Performed at [facility], [address], [place], [place] [address]    Special Requests   Final    NONE Performed at [facility], [address], [place], [place] [address]    Culture MULTIPLE SPECIES PRESENT, SUGGEST RECOLLECTION (A)  Final   Report Status 03/23/2021 FINAL  Final  Culture, blood (Routine X 2) w Reflex to ID Panel     Status: None (Preliminary result)   Collection Time: 03/22/21 12:07 PM   Specimen: BLOOD RIGHT ARM  Result Value Ref Range Status   Specimen Description BLOOD RIGHT ARM BOTTLES DRAWN AEROBIC ONLY  Final   Special Requests Blood Culture adequate volume  Final   Culture   Final    NO GROWTH 3 DAYS Performed at [facility], [address], [place], [place] [address]    Report Status PENDING  Incomplete  Culture, blood (Routine X 2) w Reflex to ID Panel     Status: None (Preliminary result)   Collection Time: 03/22/21 12:22 PM   Specimen: BLOOD RIGHT HAND  Result Value Ref Range Status   Specimen Description BLOOD RIGHT HAND BOTTLES DRAWN AEROBIC ONLY  Final   Special Requests Blood Culture adequate volume  Final   Culture   Final    NO GROWTH 3 DAYS Performed at [facility], [address], [place], [place] [address]    Report Status PENDING  Incomplete     Radiology Studies: No results found.  Scheduled Meds:  aspirin EC  81 mg Oral Daily   atorvastatin  80 mg Oral Daily   budesonide (PULMICORT) nebulizer solution  0.5 mg Nebulization BID   Chlorhexidine Gluconate Cloth  6 each Topical Daily   clopidogrel  75 mg Oral Daily   dextromethorphan-guaiFENesin  1 tablet Oral BID   escitalopram  10 mg Oral Daily   insulin aspart  0-5 Units Subcutaneous QHS   insulin  aspart  0-9 Units Subcutaneous TID WC   insulin glargine-yfgn  5 Units Subcutaneous Daily   ipratropium  0.5 mg Nebulization TID   levalbuterol  0.63 mg Nebulization TID   metoprolol succinate  25 mg Oral Daily   phosphorus  500 mg Oral BID   ranolazine  500 mg Oral BID   spironolactone  12.5 mg Oral Daily   tamsulosin  0.4 mg Oral QPC supper   Continuous Infusions:  ampicillin (OMNIPEN) IV 2 g (03/25/21 2671)   cefTRIAXone (ROCEPHIN)  IV 2 g (03/25/21 1101)    LOS: 5 days   Critical Care Procedure Note Authorized and Performed by: Murvin Natal MD  Total Critical Care time:  55 mins  Due to a high probability of clinically significant, life threatening deterioration, the patient required my highest level of preparedness to intervene emergently and I personally spent this critical care time directly and personally managing the patient.  This critical care time included obtaining a history; examining the patient, pulse oximetry; ordering and review of studies; arranging urgent treatment with development of a management plan; evaluation of patient's response of treatment; frequent reassessment; and discussions with other providers.  This critical care time was performed to assess and manage the high probability of imminent and life threatening deterioration that could result in multi-organ failure.  It was exclusive of separately billable procedures and treating other patients and teaching time.    Irwin Brakeman, MD How to contact the [facility] Attending or Consulting provider [place] or covering provider  during after hours 7P -7A, for this patient?  Check the care team in [facility] and look for a) attending/consulting [facility] provider listed and b) the [facility] team listed Log into [email or web address] and use [employer]'s universal password to access. If you do not have the password, please contact the hospital operator. Locate the [facility] provider you are looking for under [facility] and page to a number that  you can be directly reached. If you still have difficulty reaching the provider, please page the [facility] (Director on Call) for the Hospitalists listed on amion for assistance.  03/25/2021, 11:30 AM

## 2021-03-25 NOTE — Progress Notes (Signed)
1640 Patient transferred to dept 300 room # 337, nurse Eustace Pen, [place]) & NT Tye [place]) at the bedside to accept patient & apply tele monitor, personal items & medications transferred with patient, this RN called patient's wife Darryll Capers) & made aware of new room # [phone]

## 2021-03-25 NOTE — Progress Notes (Signed)
ANTICOAGULATION CONSULT NOTE  Pharmacy Consult for Heparin Indication: chest pain/ACS and atrial fibrillation  Allergies  Allergen Reactions   Oxycodone     Patient Measurements: Height: 5\' 11"  (180.3 cm) Weight: 98.6 kg (217 lb 6 oz) IBW/kg (Calculated) : 75.3 Heparin Dosing Weight: 78.5 kg  Vital Signs: Temp: 97.8 F (36.6 C) (11/09 0424) Temp Source: Oral (11/09 0424) BP: 130/46 (11/09 0600) Pulse Rate: 87 (11/09 0600)  Labs: Recent Labs    03/23/21 0458 03/24/21 0421 03/25/21 0413  HGB 8.4* 8.5* 8.1*  HCT 26.3* 26.0* 24.1*  PLT 253 214 209  HEPARINUNFRC 0.46 0.42 0.64  CREATININE 1.20 1.22 1.22     Estimated Creatinine Clearance: 56.8 mL/min (by C-G formula based on SCr of 1.22 mg/dL).  Assessment: 80 y.o. male with Afib/ACS for heparin.  Heparin level therapeutic x 2 consecutive checks.   HL 0.64- therapeutic Hgb 8.1  Goal of Therapy:  Heparin level 0.3-0.7 units/ml Monitor platelets by anticoagulation protocol: Yes   Plan:  Continue Heparin at 1500 units/hr F/U HL in am  Thomasenia Sales, PharmD, MBA, BCGP Clinical Pharmacist  03/25/2021 7:31 AM

## 2021-03-25 NOTE — Progress Notes (Signed)
TED hose received from supply & placed on patient, tolerated well size XL

## 2021-03-25 NOTE — Progress Notes (Signed)
Palliative: Mr. Reever is lying quietly in bed.  He appears acutely/chronically ill and quite frail.  He greets me, making and mostly keeping eye contact.  He is alert and oriented, able to make his basic needs known.  There is no family at bedside at this time.  We talked about his acute health concerns and the treatment plan including, but not limited to off nitro, as needed nitro for chest pain, cardiology consult, new hospitalist today.  I encourage Mr. Rathje to get out of bed as he is able.  We continue to talk about time for outcomes.  Conference with attending, bedside nursing staff, transition of care team related to placement condition, needs, goals of care.  Plan: At this point continue to treat the treatable but no CPR or intubation.  Continue with time for outcomes.  PT eval for recommendations.             25 minutes  Quinn Axe, NP Palliative medicine team Team phone [phone] Greater than 50% of this time was spent counseling and coordinating care related to the above assessment and plan.

## 2021-03-25 NOTE — Evaluation (Addendum)
Physical Therapy Evaluation Patient Details Name: LONZY MATO MRN: [id number] DOB: [birth year] Today's Date: 03/25/2021  History of Present Illness  ZACCAI CHAVARIN is a 80 y.o. male with hx of metastatic lung cancer currently on palliative immunotherapy, CAD status post four-vessel CABG, CKD, type 2 diabetes, who presents with chest pain.     Patient reports that he has felt unwell for several days.  His chest has been hurting but since just prior to presenting to the ED he has felt like an elephant is sitting on his chest.  He also feels very short of breath currently.  Also notes having a significant cough as well as being very weak.  Has not had much to eat or drink in the past couple of days either.     In the ED vital signs notable for hypoxia to the low 90s requiring 4 L nasal cannula, normal to hypertensive blood pressures, heart rate ranging from the 130s to 150s, and respirations ranging from the 20s to low 40s.  CMP notable for hyponatremia to 124, potassium 2.8, chloride 89, glucose 237, creatinine 1.49, calcium 8.1; CBC notable for white count of 19.5, remainder unremarkable.  Initial troponin was 3329, initial lactic acid was 2.9 and down trended to 2.1 on recheck.  COVID and influenza swabs were negative.  Chest x-ray was concerning for possible bilateral lower lobe opacities possibly representing infection.  Initial EKG showed some ST depressions but no clear STEMI, on repeat newly evident STEMI was apparent in leads II, III, and aVF, with reciprocal changes seen in the anterior leads.  Patient was started on broad-spectrum antibiotics, heparin drip, and IV fluids.  ED provider consulted with cardiology, who agreed with STEMI diagnosis but did not recommend Cath Lab activation at this time given critical illness, however did recommend transfer to [facility] for further treatment and stabilization.     During ED evaluation patient had expressed to that provider that he wished to be DNR/DNI.  I asked  patient the same thing and he again reiterated that he wishes to be DNR/DNI.  I asked him what his understanding was of his cancer diagnosis, and he stated that he has been in remission for many years.  He was aware that it was metastatic but did not understand the meaning of this, only that it was not growing.   Clinical Impression  Patient presents in bed awake, alert, and agreeable for therapy w/ pastor and nurse at bedside. Patient performed bed mobility w/ labored movement, required increased time and Min/Mod assist for transitioning from supine to sit d/t generalized weakness. Upon sitting patient c/o nausea and dizziness w/ no significant drop in BP. After dizziness and nausea subsided, patient scooted to EOB and attempted to perform a sit to stand transfer. Patient c/o increased dizziness and nausea and requested to lay down. Patient was repositioned comfortably in bed -nurse notified. Patient will benefit from continued physical therapy in hospital and recommended venue below to increase strength, balance, endurance for safe ADLs and gait.      Recommendations for follow up therapy are one component of a multi-disciplinary discharge planning process, led by the attending physician.  Recommendations may be updated based on patient status, additional functional criteria and insurance authorization.  Follow Up Recommendations Skilled nursing-short term rehab (<3 hours/day)    Assistance Recommended at Discharge Frequent or constant Supervision/Assistance  Functional Status Assessment    Equipment Recommendations  None recommended by PT    Recommendations for Other Services  Precautions / Restrictions Precautions Precautions: Fall Restrictions Weight Bearing Restrictions: No      Mobility  Bed Mobility Overal bed mobility: Needs Assistance Bed Mobility: Supine to Sit Rolling: Min assist;Mod assist         General bed mobility comments: Patient demonstrated slow labored  movement, required verbal cuing for mechanics.    Transfers Overall transfer level: Needs assistance Equipment used: Rolling walker (2 wheels) Transfers: Sit to/from Stand             General transfer comment: Patient unable to complete a sit to stand transfer d/t generalized weakness and c/o nausea and dizziness.    Ambulation/Gait                  Stairs            Wheelchair Mobility    Modified Rankin (Stroke Patients Only)       Balance Overall balance assessment: Needs assistance Sitting-balance support: No upper extremity supported;Feet supported Sitting balance-Leahy Scale: Fair Sitting balance - Comments: good/fair seated at EOB                                     Pertinent Vitals/Pain      Home Living Family/patient expects to be discharged to:: Private residence Living Arrangements: Spouse/significant other Available Help at Discharge: Family;Available PRN/intermittently Type of Home: House Home Access: Stairs to enter Entrance Stairs-Rails: Right;Left;Can reach both Entrance Stairs-Number of Steps: 5   Home Layout: One level Home Equipment: Cane - single point;Shower seat - built in      Prior Function                       Hand Dominance   Dominant Hand: Right    Extremity/Trunk Assessment   Upper Extremity Assessment Upper Extremity Assessment: Generalized weakness    Lower Extremity Assessment Lower Extremity Assessment: Generalized weakness       Communication   Communication: No difficulties  Cognition Arousal/Alertness: Awake/alert Behavior During Therapy: WFL for tasks assessed/performed Overall Cognitive Status: Within Functional Limits for tasks assessed                                          General Comments      Exercises     Assessment/Plan    PT Assessment Patient needs continued PT services  PT Problem List Decreased strength;Decreased  mobility;Decreased safety awareness;Decreased range of motion;Decreased activity tolerance;Decreased balance;Decreased knowledge of use of DME       PT Treatment Interventions DME instruction;Therapeutic exercise;Gait training;Balance training;Stair training;Functional mobility training;Therapeutic activities;Patient/family education    PT Goals (Current goals can be found in the Care Plan section)  Acute Rehab PT Goals Patient Stated Goal: Return home after rehab. PT Goal Formulation: With patient Time For Goal Achievement: 04/08/21 Potential to Achieve Goals: Fair    Frequency Min 3X/week   Barriers to discharge        Co-evaluation               AM-PAC PT "6 Clicks" Mobility  Outcome Measure Help needed turning from your back to your side while in a flat bed without using bedrails?: A Little Help needed moving from lying on your back to sitting on the side of a flat bed without using bedrails?:  A Little Help needed moving to and from a bed to a chair (including a wheelchair)?: A Lot (Not performed today d/t c/o dizziness and nausea.) Help needed standing up from a chair using your arms (e.g., wheelchair or bedside chair)?: A Lot (Not performed today d/t c/o dizziness and nausea.) Help needed to walk in hospital room?: A Lot (Not performed today d/t c/o dizziness and nausea.) Help needed climbing 3-5 steps with a railing? : Total (Not performed today d/t c/o dizziness and nausea.) 6 Click Score: 13    End of Session   Activity Tolerance: Patient tolerated treatment well Patient left: in bed;with call bell/phone within reach;with family/visitor present Nurse Communication: Mobility status PT Visit Diagnosis: Unsteadiness on feet (R26.81);Other abnormalities of gait and mobility (R26.89);Muscle weakness (generalized) (M62.81)    Time: 4695-0722 PT Time Calculation (min) (ACUTE ONLY): 25 min   Charges:   PT Evaluation $PT Eval Moderate Complexity: 1 Mod PT  Treatments $Therapeutic Exercise: 23-37 mins        Cassie Jones, SPT  During this treatment session, the therapist was present, participating in and directing the treatment.  4:00 PM, 03/25/21 Lonell Grandchild, MPT Physical Therapist with [facility] 336 [phone] office [phone] mobile phone

## 2021-03-25 NOTE — Progress Notes (Signed)
Provided patient care after bowel movement & noted dark red urine in urinary catheter line, patient denied any discomfort or pain at this time, MD notified

## 2021-03-26 DIAGNOSIS — C3490 Malignant neoplasm of unspecified part of unspecified bronchus or lung: Secondary | ICD-10-CM | POA: Diagnosis not present

## 2021-03-26 DIAGNOSIS — I213 ST elevation (STEMI) myocardial infarction of unspecified site: Secondary | ICD-10-CM | POA: Diagnosis not present

## 2021-03-26 DIAGNOSIS — R7881 Bacteremia: Secondary | ICD-10-CM | POA: Diagnosis not present

## 2021-03-26 DIAGNOSIS — Z515 Encounter for palliative care: Secondary | ICD-10-CM | POA: Diagnosis not present

## 2021-03-26 DIAGNOSIS — Z7189 Other specified counseling: Secondary | ICD-10-CM | POA: Diagnosis not present

## 2021-03-26 DIAGNOSIS — I4891 Unspecified atrial fibrillation: Secondary | ICD-10-CM | POA: Diagnosis not present

## 2021-03-26 DIAGNOSIS — N1831 Chronic kidney disease, stage 3a: Secondary | ICD-10-CM | POA: Diagnosis not present

## 2021-03-26 LAB — BASIC METABOLIC PANEL
Anion gap: 6 (ref 5–15)
BUN: 7 mg/dL — ABNORMAL LOW (ref 8–23)
CO2: 25 mmol/L (ref 22–32)
Calcium: 7.1 mg/dL — ABNORMAL LOW (ref 8.9–10.3)
Chloride: 102 mmol/L (ref 98–111)
Creatinine, Ser: 1.3 mg/dL — ABNORMAL HIGH (ref 0.61–1.24)
GFR, Estimated: 55 mL/min — ABNORMAL LOW (ref >60)
Glucose, Bld: 140 mg/dL — ABNORMAL HIGH (ref 70–99)
Potassium: 3.7 mmol/L (ref 3.5–5.1)
Sodium: 133 mmol/L — ABNORMAL LOW (ref 135–145)

## 2021-03-26 LAB — CBC
HCT: 26.2 % — ABNORMAL LOW (ref 39.0–52.0)
Hemoglobin: 8.5 g/dL — ABNORMAL LOW (ref 13.0–17.0)
MCH: 25.4 pg — ABNORMAL LOW (ref 26.0–34.0)
MCHC: 32.4 g/dL (ref 30.0–36.0)
MCV: 78.2 fL — ABNORMAL LOW (ref 80.0–100.0)
Platelets: 217 10*3/uL (ref 150–400)
RBC: 3.35 MIL/uL — ABNORMAL LOW (ref 4.22–5.81)
RDW: 16 % — ABNORMAL HIGH (ref 11.5–15.5)
WBC: 6.2 10*3/uL (ref 4.0–10.5)
nRBC: 0 % (ref 0.0–0.2)

## 2021-03-26 LAB — URINE CULTURE: Culture: NO GROWTH

## 2021-03-26 LAB — GLUCOSE, CAPILLARY
Glucose-Capillary: 146 mg/dL — ABNORMAL HIGH (ref 70–99)
Glucose-Capillary: 152 mg/dL — ABNORMAL HIGH (ref 70–99)
Glucose-Capillary: 97 mg/dL (ref 70–99)
Glucose-Capillary: 144 mg/dL — ABNORMAL HIGH (ref 70–99)

## 2021-03-26 LAB — MAGNESIUM: Magnesium: 1.9 mg/dL (ref 1.7–2.4)

## 2021-03-26 MED ORDER — LOSARTAN POTASSIUM 25 MG PO TABS
12.5000 mg | ORAL_TABLET | Freq: Every day | ORAL | Status: DC
  Administered 2021-03-26: 12.5 mg via ORAL
  Filled 2021-03-26 (×2): qty 1

## 2021-03-26 NOTE — Plan of Care (Signed)
  Problem: Education: Goal: Knowledge of General Education information will improve Description Including pain rating scale, medication(s)/side effects and non-pharmacologic comfort measures Outcome: Progressing   Problem: Health Behavior/Discharge Planning: Goal: Ability to manage health-related needs will improve Outcome: Progressing   

## 2021-03-26 NOTE — TOC Initial Note (Addendum)
Transition of Care [facility]) - Initial/Assessment Note    Patient Details  Name: [name] MRN: [id number] Date of Birth: 1940/06/01  Transition of Care [facility]) CM/SW Contact:    Natasha Bence, LCSW Phone Number: 03/26/2021, 3:54 PM  Clinical Narrative:                 Patient is a 80 year old male admitted for Bacteremia due to Enterococcus. CSW conducted initial assessment. Patient's wife reported that patient is ambulatory at baseline and was able to complete ADL's independently. Patient's wife agreeable to SNF referral if patient's mobility doesn't improve. CSW completed FL2 and faxed patient out. Auth started. TOC to follow.   Expected Discharge Plan: Skilled Nursing Facility Barriers to Discharge: Continued Medical Work up   Patient Goals and CMS Choice   CMS Medicare.gov Compare Post Acute Care list provided to:: Patient Choice offered to / list presented to : Patient  Expected Discharge Plan and Services Expected Discharge Plan: [place]                                              Prior Living Arrangements/Services   Lives with:: Self Patient language and need for interpreter reviewed:: Yes Do you feel safe going back to the place where you live?: Yes      Need for Family Participation in Patient Care: Yes (Comment) Care giver support system in place?: Yes (comment)   Criminal Activity/Legal Involvement Pertinent to Current Situation/Hospitalization: No - Comment as needed  Activities of Daily Living Home Assistive Devices/Equipment: None ADL Screening (condition at time of admission) Patient's cognitive ability adequate to safely complete daily activities?: Yes Is the patient deaf or have difficulty hearing?: Yes Does the patient have difficulty seeing, even when wearing glasses/contacts?: No Does the patient have difficulty concentrating, remembering, or making decisions?: No Patient able to express need for assistance with ADLs?:  Yes Does the patient have difficulty dressing or bathing?: Yes Independently performs ADLs?: No Dressing (OT): Needs assistance Is this a change from baseline?: Pre-admission baseline Grooming: Needs assistance Is this a change from baseline?: Pre-admission baseline Feeding: Independent with device (comment) Bathing: Needs assistance Is this a change from baseline?: Pre-admission baseline Toileting: Needs assistance Is this a change from baseline?: Pre-admission baseline In/Out Bed: Dependent Is this a change from baseline?: Change from baseline, expected to last >3 days Does the patient have difficulty walking or climbing stairs?: Yes Weakness of Legs: Both Weakness of Arms/Hands: Both  Permission Sought/Granted Permission sought to share information with : Family Supports Permission granted to share information with : Yes, Verbal Permission Granted  Share Information with NAME: Reco, Shonk  Permission granted to share info w AGENCY: Local SNF's and HH  Permission granted to share info w Relationship: (Spouse)  Permission granted to share info w Contact Information: [phone]  Emotional Assessment     Affect (typically observed): Adaptable Orientation: : Oriented to Self, Oriented to Situation, Oriented to Place, Oriented to  Time Alcohol / Substance Use: Not Applicable Psych Involvement: No (comment)  Admission diagnosis:  Hyponatremia [E87.1] STEMI (ST elevation myocardial infarction) (HCC) [I21.3] NSTEMI (non-ST elevated myocardial infarction) ([place]) [I21.4] Atrial fibrillation with rapid ventricular response (HCC) [I48.91] Sepsis, due to unspecified organism, unspecified whether acute organ dysfunction present [facility]) [A41.9] Patient Active Problem List   Diagnosis Date Noted   Sepsis due to Enterococcus (  [place]) 03/22/2021   Bacteremia due to Enterococcus 03/22/2021   Atrial fibrillation with rapid ventricular response (HCC)    Hyponatremia    Sepsis ([place])    STEMI  (ST elevation myocardial infarction) ([place]) 03/20/2021   Trochanteric avulsion fracture of femur ([place]) 07/14/2020   Chest pain 12/27/2017   Adenocarcinoma of lung, 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 [place] [address] Phone: [phone] Fax: [phone]     Social Determinants of Health ([place]) Interventions    Readmission Risk Interventions Readmission Risk Prevention Plan 07/17/2020  Transportation Screening Complete  PCP or Specialist Appt within 5-7 Days Not Complete  Not Complete comments Patient discharging to SNF  Home Care Screening Complete  Medication Review (RN CM) Complete  Some recent data might be hidden

## 2021-03-26 NOTE — NC FL2 (Signed)
[place] LEVEL OF CARE SCREENING TOOL     IDENTIFICATION  Patient Name: [name] Birthdate: [birth year] Sex: male Admission Date (Current Location): 03/20/2021  [facility] and [place] Number:  [employer] and Address:  [place] [address] [place]      Provider Number: [id number]  Attending Physician Name and Address:  Murlean Iba, MD  Relative Name and Phone Number:  Sahand, Gosch (Spouse)   [phone]    Current Level of Care: SNF Recommended Level of Care: [place] Prior Approval Number:    Date Approved/Denied: 07/15/20 PASRR Number: [id number]  Discharge Plan: SNF    Current Diagnoses: Patient Active Problem List   Diagnosis Date Noted   Sepsis due to Enterococcus ([place]) 03/22/2021   Bacteremia due to Enterococcus 03/22/2021   Atrial fibrillation with rapid ventricular response (HCC)    Hyponatremia    Sepsis ([place])    STEMI (ST elevation myocardial infarction) ([place]) 03/20/2021   Trochanteric avulsion fracture of femur ([place]) 07/14/2020   Chest pain 12/27/2017   Adenocarcinoma of lung, 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   Abnormal CT of the abdomen 09/05/2017   CKD (chronic kidney disease) stage 3, GFR 30-59 ml/min ([place]) 06/20/2017   Type 2 diabetes mellitus without complication, without long-term current use of insulin ([place]) 11/04/2008   Coronary atherosclerosis 11/04/2008   Angina pectoris ([place]) 11/04/2008   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    Orientation RESPIRATION BLADDER Height & Weight     Self, Time, Situation, Place  Normal Incontinent Weight: 197 lb 1.5 oz (89.4 kg) Height:  5\' 11"  (180.3 cm)  BEHAVIORAL SYMPTOMS/MOOD NEUROLOGICAL BOWEL NUTRITION STATUS      Incontinent Diet (Diet NPO time specified)  AMBULATORY STATUS COMMUNICATION OF NEEDS Skin   Extensive Assist Verbally Other (Comment) (Avulsion Ecchymosis Arm bilateral)                       Personal Care Assistance  Level of Assistance  Bathing, Feeding, Dressing Bathing Assistance: Maximum assistance Feeding assistance: Independent Dressing Assistance: Maximum assistance     Functional Limitations Info  Sight, Hearing, Speech Sight Info: Impaired Hearing Info: Impaired Speech Info: Adequate    SPECIAL CARE FACTORS FREQUENCY  PT (By licensed PT), OT (By licensed OT)     PT Frequency: 5x OT Frequency: 5x            Contractures Contractures Info: Not present    Additional Factors Info  Code Status, Allergies, Insulin Sliding Scale, Psychotropic Code Status Info: DNR Allergies Info: Oxycodone Psychotropic Info: Zofran Insulin Sliding Scale Info: Correction coverage: HS scale   CBG < 70: Implement Hypoglycemia Standing Orders and refer to Hypoglycemia Standing Orders sidebar report   CBG 70 - 120: 0 units   CBG 121 - 150: 0 units   CBG 151 - 200: 0 units   CBG 201 - 250: 2 units   CBG 251 - 300: 3 units   CBG 301 - 350: 4 units   CBG 351 - 400: 5 units   CBG > 400 call MD and obtain STAT lab verification       Current Medications (03/26/2021):  This is the current hospital active medication list Current Facility-Administered Medications  Medication Dose Route Frequency Provider Last Rate Last Admin   acetaminophen (TYLENOL) tablet 650 mg  650 mg Oral Q6H PRN Clarnce Flock, MD       Or   acetaminophen (TYLENOL)  suppository 650 mg  650 mg Rectal Q6H PRN Clarnce Flock, MD       ampicillin (OMNIPEN) 2 g in sodium chloride 0.9 % 100 mL IVPB  2 g Intravenous Q6H Orson Eva, MD 300 mL/hr at 03/26/21 1255 2 g at 03/26/21 1255   apixaban (ELIQUIS) tablet 5 mg  5 mg Oral BID Johnson, Clanford L, MD   5 mg at 03/26/21 1027   atorvastatin (LIPITOR) tablet 80 mg  80 mg Oral Daily Clarnce Flock, MD   80 mg at 03/26/21 1026   budesonide (PULMICORT) nebulizer solution 0.5 mg  0.5 mg Nebulization BID Tat, Shanon Brow, MD   0.5 mg at 03/26/21 0820   cefTRIAXone (ROCEPHIN) 2 g in sodium chloride  0.9 % 100 mL IVPB  2 g Intravenous Q12H Berton Mount, RPH 200 mL/hr at 03/26/21 1030 2 g at 03/26/21 1030   Chlorhexidine Gluconate Cloth 2 % PADS 6 each  6 each Topical Daily Tat, David, MD   6 each at 03/26/21 1029   clopidogrel (PLAVIX) tablet 75 mg  75 mg Oral Daily Chandrasekhar, Mahesh A, MD   75 mg at 03/26/21 1035   dextromethorphan-guaiFENesin ([place] DM) 30-600 MG per 12 hr tablet 1 tablet  1 tablet Oral BID Adefeso, Oladapo, DO   1 tablet at 03/26/21 1028   escitalopram (LEXAPRO) tablet 10 mg  10 mg Oral Daily Clarnce Flock, MD   10 mg at 03/26/21 1027   insulin aspart (novoLOG) injection 0-5 Units  0-5 Units Subcutaneous Benay Pike, MD   2 Units at 03/22/21 2156   insulin aspart (novoLOG) injection 0-9 Units  0-9 Units Subcutaneous TID WC Orson Eva, MD   2 Units at 03/26/21 1256   insulin glargine-yfgn (SEMGLEE) injection 5 Units  5 Units Subcutaneous Daily Tat, David, MD   5 Units at 03/26/21 1028   ipratropium (ATROVENT) nebulizer solution 0.5 mg  0.5 mg Nebulization TID Tat, Shanon Brow, MD   0.5 mg at 03/26/21 1357   levalbuterol (XOPENEX) nebulizer solution 0.63 mg  0.63 mg Nebulization TID Tat, Shanon Brow, MD   0.63 mg at 03/26/21 1357   levalbuterol (XOPENEX) nebulizer solution 0.63 mg  0.63 mg Nebulization Q4H PRN Tat, Shanon Brow, MD   0.63 mg at 03/24/21 1817   losartan (COZAAR) tablet 12.5 mg  12.5 mg Oral Daily Arnoldo Lenis, MD   12.5 mg at 03/26/21 1256   metoprolol succinate (TOPROL-XL) 24 hr tablet 25 mg  25 mg Oral Daily Fransico Him R, MD   25 mg at 03/26/21 1027   nitroGLYCERIN (NITROSTAT) SL tablet 0.4 mg  0.4 mg Sublingual Q5 Min x 3 PRN Clarnce Flock, MD   0.4 mg at 03/24/21 1806   ondansetron (ZOFRAN) tablet 4 mg  4 mg Oral Q6H PRN Clarnce Flock, MD       Or   ondansetron [facility]) injection 4 mg  4 mg Intravenous Q6H PRN Clarnce Flock, MD   4 mg at 03/24/21 1730   phosphorus (K PHOS NEUTRAL) tablet 500 mg  500 mg Oral BID Tat, David, MD   500 mg  at 03/26/21 1029   polyethylene glycol (MIRALAX / GLYCOLAX) packet 17 g  17 g Oral Daily PRN Clarnce Flock, MD       ranolazine (RANEXA) 12 hr tablet 500 mg  500 mg Oral BID Chandrasekhar, Mahesh A, MD   500 mg at 03/26/21 1028   spironolactone (ALDACTONE) tablet 12.5 mg  12.5 mg  Oral Daily Fransico Him R, MD   12.5 mg at 03/26/21 1027   tamsulosin (FLOMAX) capsule 0.4 mg  0.4 mg Oral QPC supper Clarnce Flock, MD   0.4 mg at 03/25/21 1751     Discharge Medications: Please see discharge summary for a list of discharge medications.  Relevant Imaging Results:  Relevant Lab Results:   Additional Information PT [id number]  Natasha Bence, LCSW

## 2021-03-26 NOTE — Progress Notes (Signed)
Palliative: [name] is lying quietly in bed.  He greets me, making and mostly keeping eye contact.  He appears acutely/chronically ill and somewhat frail.  He is alert and oriented, able to make his basic needs known.  His wife, [name], is at bedside.  We talked about his acute health concerns, transitioning to by mouth blood thinner.  [name] is able to tell me that he is tentatively scheduled for TEE tomorrow.  I reminded him that this will help dictate how long he needs IV antibiotic for bacteremia.  We talked about short-term rehab.  [name] initially states that he does not want to go to short-term rehab.  After discussion, he agrees with his wife that he would benefit from short-term rehab.  They are working with transition of care team for placement.  We also talked about outpatient palliative following for support.  Patient and wife are agreeable to outpatient palliative, provider choice offered, they choose hospice of [facility].  Conference with attending, bedside nursing staff, transition of care team related to patient condition, needs, goals of care, disposition.  Plan: At this point continue to treat the treatable but no CPR or intubation.  Agreeable to short-term rehab if they can find the right venue.  Outpatient palliative services with hospice of [facility].  56 minutes Quinn Axe, NP Palliative medicine team Team phone [phone] Greater than 50% of this time was spent counseling and coordinating care related to the above assessment and plan.

## 2021-03-26 NOTE — Progress Notes (Addendum)
Physical Therapy Treatment Patient Details Name: [name] MRN: [id number] DOB: 02-Mar-1941 Today's Date: 03/26/2021   History of Present Illness [name] is a 80 y.o. male with hx of metastatic lung cancer currently on palliative immunotherapy, CAD status post four-vessel CABG, CKD, type 2 diabetes, who presents with chest pain.     Patient reports that he has felt unwell for several days.  His chest has been hurting but since just prior to presenting to the ED he has felt like an elephant is sitting on his chest.  He also feels very short of breath currently.  Also notes having a significant cough as well as being very weak.  Has not had much to eat or drink in the past couple of days either.     In the ED vital signs notable for hypoxia to the low 90s requiring 4 L nasal cannula, normal to hypertensive blood pressures, heart rate ranging from the 130s to 150s, and respirations ranging from the 20s to low 40s.  CMP notable for hyponatremia to 124, potassium 2.8, chloride 89, glucose 237, creatinine 1.49, calcium 8.1; CBC notable for white count of 19.5, remainder unremarkable.  Initial troponin was 3329, initial lactic acid was 2.9 and down trended to 2.1 on recheck.  COVID and influenza swabs were negative.  Chest x-ray was concerning for possible bilateral lower lobe opacities possibly representing infection.  Initial EKG showed some ST depressions but no clear STEMI, on repeat newly evident STEMI was apparent in leads II, III, and aVF, with reciprocal changes seen in the anterior leads.  Patient was started on broad-spectrum antibiotics, heparin drip, and IV fluids.  ED provider consulted with cardiology, who agreed with STEMI diagnosis but did not recommend Cath Lab activation at this time given critical illness, however did recommend transfer to [facility] for further treatment and stabilization.     During ED evaluation patient had expressed to that provider that he wished to be DNR/DNI.  I asked  patient the same thing and he again reiterated that he wishes to be DNR/DNI.  I asked him what his understanding was of his cancer diagnosis, and he stated that he has been in remission for many years.  He was aware that it was metastatic but did not understand the meaning of this, only that it was not growing.    PT Comments    Patient presents in bed asleep on room air, agreeable for therapy once awake and alert. Patient continues to perform bed mobility w/ slow labored movement, required Min assist for transitioning from sideling to sitting. Patient began a seated exercise program at the EOB, only able to complete 10 toe raises and a couple of heel raises before becoming lethargic and slow to respond to questions, required Min guard/Min assist to maintain an upright seated position and unable to attempt sit to stands or transferring to chair.  Patient' SpO2 averaged 92% and BP was 145/94 - nurse staff notified. Patient requested to lay down and became more alert and responsive once laying in bed. Patient will benefit from continued skilled physical therapy in hospital and recommended venue below to increase strength, balance, endurance for safe ADLs and gait.    Recommendations for follow up therapy are one component of a multi-disciplinary discharge planning process, led by the attending physician.  Recommendations may be updated based on patient status, additional functional criteria and insurance authorization.  Follow Up Recommendations  Skilled nursing-short term rehab (<3 hours/day)     Assistance  Recommended at Discharge Frequent or constant Supervision/Assistance  Equipment Recommendations  None recommended by PT    Recommendations for Other Services       Precautions / Restrictions Precautions Precautions: Fall Restrictions Weight Bearing Restrictions: No     Mobility  Bed Mobility Overal bed mobility: Needs Assistance Bed Mobility: Supine to Sit Rolling: Min assist;Min  guard   Supine to sit: Min assist     General bed mobility comments: Patient continues to demonstrate slow labored movement    Transfers                        Ambulation/Gait                   Stairs             Wheelchair Mobility    Modified Rankin (Stroke Patients Only)       Balance Overall balance assessment: Needs assistance Sitting-balance support: No upper extremity supported;Feet supported Sitting balance-Leahy Scale: Fair Sitting balance - Comments: good/fair seated at EOB                                    Cognition Arousal/Alertness: Awake/alert Behavior During Therapy: WFL for tasks assessed/performed Overall Cognitive Status: Within Functional Limits for tasks assessed                                          Exercises General Exercises - Lower Extremity Toe Raises: Seated;AROM;Strengthening;Both;10 reps    General Comments        Pertinent Vitals/Pain      Home Living                          Prior Function            PT Goals (current goals can now be found in the care plan section) Acute Rehab PT Goals Patient Stated Goal: Return home after rehab. PT Goal Formulation: With patient Time For Goal Achievement: 04/08/21 Potential to Achieve Goals: Fair Progress towards PT goals: Progressing toward goals    Frequency    Min 3X/week      PT Plan Current plan remains appropriate    Co-evaluation              AM-PAC PT "6 Clicks" Mobility   Outcome Measure  Help needed turning from your back to your side while in a flat bed without using bedrails?: A Little Help needed moving from lying on your back to sitting on the side of a flat bed without using bedrails?: A Little Help needed moving to and from a bed to a chair (including a wheelchair)?: A Lot Help needed standing up from a chair using your arms (e.g., wheelchair or bedside chair)?: A Lot Help needed  to walk in hospital room?: A Lot Help needed climbing 3-5 steps with a railing? : Total 6 Click Score: 13    End of Session   Activity Tolerance: Patient tolerated treatment well;Patient limited by lethargy Patient left: in bed;with call bell/phone within reach Nurse Communication: Mobility status PT Visit Diagnosis: Unsteadiness on feet (R26.81);Other abnormalities of gait and mobility (R26.89);Muscle weakness (generalized) (M62.81)     Time: 6433-2951 PT Time Calculation (min) (ACUTE ONLY): 25 min  Charges:  $  Therapeutic Exercise: 8-22 mins $Therapeutic Activity: 8-22 mins                     Cassie Jones, SPT  During this treatment session, the therapist was present, participating in and directing the treatment.  3:44 PM, 03/26/21 Lonell Grandchild, MPT Physical Therapist with [facility] [phone] office [phone] mobile phone

## 2021-03-26 NOTE — Progress Notes (Signed)
Progress Note  Patient Name: [name] Date of Encounter: 03/26/2021  [facility] Cardiologist: Kate Sable, MD (Inactive)   Subjective  No complaints   Inpatient Medications    Scheduled Meds:  apixaban  5 mg Oral BID   atorvastatin  80 mg Oral Daily   budesonide (PULMICORT) nebulizer solution  0.5 mg Nebulization BID   Chlorhexidine Gluconate Cloth  6 each Topical Daily   clopidogrel  75 mg Oral Daily   dextromethorphan-guaiFENesin  1 tablet Oral BID   escitalopram  10 mg Oral Daily   insulin aspart  0-5 Units Subcutaneous QHS   insulin aspart  0-9 Units Subcutaneous TID WC   insulin glargine-yfgn  5 Units Subcutaneous Daily   ipratropium  0.5 mg Nebulization TID   levalbuterol  0.63 mg Nebulization TID   metoprolol succinate  25 mg Oral Daily   phosphorus  500 mg Oral BID   ranolazine  500 mg Oral BID   spironolactone  12.5 mg Oral Daily   tamsulosin  0.4 mg Oral QPC supper   Continuous Infusions:  ampicillin (OMNIPEN) IV 2 g (03/26/21 0641)   cefTRIAXone (ROCEPHIN)  IV 2 g (03/26/21 1030)   PRN Meds: acetaminophen **OR** acetaminophen, levalbuterol, nitroGLYCERIN, ondansetron **OR** ondansetron (ZOFRAN) IV, polyethylene glycol   Vital Signs    Vitals:   03/25/21 2124 03/26/21 0025 03/26/21 0548 03/26/21 0821  BP:  131/65 (!) 109/47   Pulse:  81 85   Resp:  19 18   Temp:  97.7 F (36.5 C) 97.8 F (36.6 C)   TempSrc:  Oral    SpO2: 94% 95% 92% 93%  Weight:   89.4 kg   Height:        Intake/Output Summary (Last 24 hours) at 03/26/2021 1046 Last data filed at 03/26/2021 0900 Gross per 24 hour  Intake 615.93 ml  Output 900 ml  Net -284.07 ml   Last 3 Weights 03/26/2021 03/25/2021 03/24/2021  Weight (lbs) 197 lb 1.5 oz 217 lb 6 oz 184 lb 1.4 oz  Weight (kg) 89.4 kg 98.6 kg 83.5 kg      Telemetry    Paroxysmal afib, intermittent SR - Personally Reviewed  ECG    N/a - Personally Reviewed  Physical Exam   GEN: No acute distress.    Neck: No JVD Cardiac: RRR, no murmurs, rubs, or gallops.  Respiratory: Clear to auscultation bilaterally. GI: Soft, nontender, non-distended  MS: No edema; No deformity. Neuro:  Nonfocal  Psych: Normal affect   Labs    High Sensitivity Troponin:   Recent Labs  Lab 03/20/21 2004 03/20/21 2210 03/21/21 0017 03/21/21 0236 03/21/21 0418  TROPONINIHS 3,016* 3,762* 4,413* 6,544* 8,310*     Chemistry Recent Labs  Lab 03/20/21 2210 03/21/21 0865 03/22/21 0140 03/22/21 0140 03/23/21 0458 03/24/21 0421 03/25/21 0413 03/26/21 0541  NA 127*   < > 128*  --  127* 130* 133* 133*  K 3.3*   < > 3.2*  --  3.8 3.8 3.3* 3.7  CL 98   < > 102  --  103 105 103 102  CO2 17*   < > 20*  --  19* 21* 22 25  GLUCOSE 218*   < > 195*  --  200* 164* 111* 140*  BUN 14   < > 15  --  11 9 8  7*  CREATININE 1.34*   < > 1.23  --  1.20 1.22 1.22 1.30*  CALCIUM 7.1*   < > 7.1*  --  6.9* 6.9* 6.9* 7.1*  MG  --   --   --    < > 1.8 1.7 2.0 1.9  PROT 6.4*  --  5.7*  --  5.7*  --   --   --   ALBUMIN 2.0*  --  1.7*  --  1.6*  --   --   --   AST 27  --  40  --  21  --   --   --   ALT 12  --  14  --  11  --   --   --   ALKPHOS 100  --  80  --  74  --   --   --   BILITOT 1.1  --  0.6  --  0.6  --   --   --   GFRNONAA 53*   < > 59*  --  >60 60* 60* 55*  ANIONGAP 12   < > 6  --  5 4* 8 6   < > = values in this interval not displayed.    Lipids No results for input(s): CHOL, TRIG, HDL, LABVLDL, LDLCALC, CHOLHDL in the last 168 hours.  Hematology Recent Labs  Lab 03/24/21 0421 03/25/21 0413 03/26/21 0541  WBC 8.5 6.6 6.2  RBC 3.37* 3.23* 3.35*  HGB 8.5* 8.1* 8.5*  HCT 26.0* 24.1* 26.2*  MCV 77.2* 74.6* 78.2*  MCH 25.2* 25.1* 25.4*  MCHC 32.7 33.6 32.4  RDW 15.6* 15.4 16.0*  PLT 214 209 217   Thyroid No results for input(s): TSH, FREET4 in the last 168 hours.  BNPNo results for input(s): BNP, PROBNP in the last 168 hours.  DDimer No results for input(s): DDIMER in the last 168 hours.   Radiology     No results found.  Cardiac Studies    Patient Profile     [name] is a 80 y.o. 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  Assessment & Plan    1.CAD/STEMI/ACute systolic HF - history of CABG in 2008 - cath 2019 showed severe native 3 vessel disease, patent LIMA-LAD, 99% SVG-Diag, multilple high grade stenosis SVG-OM and SVG-RCA. From cath note PCI of limited benefit given multiple vein graft lesions in old SVGs. Patient was evaluated for redo CABG however turned down due to metastatic cancer history.  - presentd with inferior STEMI, patient refused transfer and cath on admission. - given his prior anatomy likely poor revasc options   - trop up to 8300 without established peak - echo LVE 67-89%, normal diastolic, normal RV. Mild MR. Inferior hypokinesis.    -medically managed STEMI.Completed 48 hrs heparin.  He is on atorva 80, plavix 75, toprol 25, aldactone 12.5mg , ranexa 500mg  bid (no ASA since on eliquis, plan for eliquis and plavix) - soft bp's at times, though elevated most of yesterday. Start losartan 12.5mg  daily, may transition to entresto pending bp's. Possible SGLT2i prior to discharge      2.Metastatic lung cancer     3.Bacteremia/Severe sepsis - being treated for bacteremia and pneumonia by primary team - primary team has asked for TEE when patient more medically stable.   -plan for TEE tomorrow   4. Afib - new diagnosis this admission in setting of STEMI - on toprol 25mg  daily - has been  paroxysmal this admission in and out of afib, rates have been controlled.  - CHADS2Vasc score is 6 (CHF, HTN, age x 2, DM2, CAD).On eliquis     4.DNR/DNI - palliative following patient, DNR/DNI but conitnue medical treatments.   For questions or updates,  please contact [place] Please consult [email or web address] for contact info under        Signed, Carlyle Dolly, MD  03/26/2021, 10:46 AM

## 2021-03-26 NOTE — Progress Notes (Signed)
PROGRESS NOTE   [name]  CZY:[id number] DOB: 10-Jun-1940 DOA: 03/20/2021 PCP: [name], MD   Chief Complaint  Patient presents with   Fever   Level of care: Telemetry  Brief Admission History:  80 y.o. male with hx of metastatic lung cancer currently on palliative immunotherapy, CAD status post four-vessel CABG, CKD, type 2 diabetes, who presents with chest pain.  Patient reports that he has felt unwell for several days.  His chest has been hurting but since just prior to presenting to the ED he has felt like an elephant is sitting on his chest.  He also feels very short of breath currently.  Also notes having a significant cough as well as being very weak.  Has not had much to eat or drink in the past couple of days either.  He was admitted with STEMI that is being managed medically.    Assessment & Plan:   Active Problems:   Type 2 diabetes mellitus without complication, without long-term current use of insulin (HCC)   CKD (chronic kidney disease) stage 3, GFR 30-59 ml/min (HCC)   S/P CABG x 4   Adenocarcinoma of lung, stage 4 (HCC)   STEMI (ST elevation myocardial infarction) ([place])   Hyponatremia   Sepsis ([place])   Sepsis due to Enterococcus ([place])   Bacteremia due to Enterococcus   Atrial fibrillation with rapid ventricular response ([place])   STEMI - Pt is being medically managed.   - He completed a course of IV heparin and nitroglycerin - continue statin therapy.  - continue plavix; DC ASA when starting apixaban - Echo 01/09/21: EF 55-60%, no WMA, normal RV, mild MR - ranexa BID - cardiology team following.   Severe Sepsis - RESOLVED -Sepsis physiology is resolved now -Secondary to E faecalis bacteremia and pneumonia -Lactic acidosis has resolved -Continue IV antibiotics as ordered  E faecalis bacteremia -Repeated blood cultures have been no growth to date -Planning for TEE when okay with cardiology team likely 03/27/21  -Continue IV ampicillin as ordered -  duration of therapy 5-7 days if TEE negative  Lobar pneumonia  - treated with cefepime, now on ampicillin -MRSA negative   Acute respiratory failure with hypoxia -Weaned to room air if possible  Paroxysmal atrial fibrillation -Okay with cardiology to transition to apixaban today -Consult to Pharm.D. for apixaban, DC aspirin, continue Plavix -Bleeding precautions -CHA2DS2-VASc score equals 6 -Continue metoprolol for rate control  Stage IIIb CKD -Stable  Acute urinary retention -DC foley and do voiding trial 11/10   Hematuria - suspect related to foley placement, following  - cbc in AM  Metastatic lung adenocarcinoma -He is followed closely by Dr. Delton Coombes and currently holding immunotherapy in setting of acute infection -Outpatient follow-up with Dr. Delton Coombes has been scheduled for 11/28  Type 2 diabetes mellitus with vascular complications -Uncontrolled as evidenced by hemoglobin A1c of 7.3% -Holding home Amaryl and metformin and Actos -Currently being managed with low-dose glargine CBG (last 3)  Recent Labs    03/26/21 0729 03/26/21 1111 03/26/21 1609  GLUCAP 146* 152* 97  ' Essential hypertension -Controlled on current medications continue to follow  Hyponatremia - stable   Hypophosphatemia -This is been repleted  DNR present on admission -Patient wishes to continue DNR orders inpatient -Appreciate palliative medicine team for goals of care discussion   DVT prophylaxis: Apixaban Code Status: Full Family Communication: update to patient at bedside, verbalized understanding Disposition: Anticipate SNF placement soon  Status is: Inpatient  Remains inpatient appropriate because:  He was on IV heparin infusion and still awaiting inpatient TEE as part of work-up for bacteremia    Consultants:  Cardiology Palliative medicine  Procedures:  Tentatively TEE for 11/11  Antimicrobials:     Subjective: No CP and no SOB but remains weak.    Objective: Vitals:   03/26/21 0548 03/26/21 0821 03/26/21 1232 03/26/21 1357  BP: (!) 109/47  125/85   Pulse: 85  78   Resp: 18  18   Temp: 97.8 F (36.6 C)  97.6 F (36.4 C)   TempSrc:      SpO2: 92% 93% 95% 96%  Weight: 89.4 kg     Height:        Intake/Output Summary (Last 24 hours) at 03/26/2021 1646 Last data filed at 03/26/2021 1300 Gross per 24 hour  Intake 480 ml  Output 900 ml  Net -420 ml   Filed Weights   03/24/21 0448 03/25/21 0424 03/26/21 0548  Weight: 83.5 kg 98.6 kg 89.4 kg    Examination:  General exam: Chronically ill-appearing male lying in the bed he is awake and alert in no apparent distress appears calm and comfortable  Respiratory system: Upper anterior congestion noises heard. Respiratory effort normal. Cardiovascular system: normal S1 & S2 heard. No JVD, murmurs, rubs, gallops or clicks. No pedal edema. Gastrointestinal system: Abdomen is nondistended, soft and nontender. No organomegaly or masses felt. Normal bowel sounds heard. Central nervous system: Alert and oriented. No focal neurological deficits. Extremities: Symmetric 5 x 5 power. Skin: No rashes, lesions or ulcers Psychiatry: Judgement and insight appear poor. Mood & affect appropriate.   Data Reviewed: I have personally reviewed following labs and imaging studies  CBC: Recent Labs  Lab 03/20/21 1757 03/21/21 0418 03/22/21 0140 03/23/21 0458 03/24/21 0421 03/25/21 0413 03/26/21 0541  WBC 19.5*   < > 9.4 8.7 8.5 6.6 6.2  NEUTROABS 16.6*  --   --   --   --   --   --   HGB 11.6*   < > 8.3* 8.4* 8.5* 8.1* 8.5*  HCT 35.3*   < > 25.7* 26.3* 26.0* 24.1* 26.2*  MCV 75.8*   < > 78.4* 78.5* 77.2* 74.6* 78.2*  PLT 407*   < > 261 253 214 209 217   < > = values in this interval not displayed.    Basic Metabolic Panel: Recent Labs  Lab 03/22/21 0140 03/23/21 0458 03/24/21 0421 03/25/21 0413 03/26/21 0541  NA 128* 127* 130* 133* 133*  K 3.2* 3.8 3.8 3.3* 3.7  CL 102 103 105  103 102  CO2 20* 19* 21* 22 25  GLUCOSE 195* 200* 164* 111* 140*  BUN 15 11 9 8  7*  CREATININE 1.23 1.20 1.22 1.22 1.30*  CALCIUM 7.1* 6.9* 6.9* 6.9* 7.1*  MG  --  1.8 1.7 2.0 1.9  PHOS  --  2.1* 3.1 4.4  --     GFR: Estimated Creatinine Clearance: 47.5 mL/min (A) (by C-G formula based on SCr of 1.3 mg/dL (H)).  Liver Function Tests: Recent Labs  Lab 03/20/21 1757 03/20/21 2210 03/22/21 0140 03/23/21 0458  AST 32 27 40 21  ALT 14 12 14 11   ALKPHOS 130* 100 80 74  BILITOT 1.1 1.1 0.6 0.6  PROT 8.3* 6.4* 5.7* 5.7*  ALBUMIN 2.6* 2.0* 1.7* 1.6*    CBG: Recent Labs  Lab 03/25/21 1657 03/25/21 2028 03/26/21 0729 03/26/21 1111 03/26/21 1609  GLUCAP 146* 164* 146* 152* 97    Recent Results (from  the past 240 hour(s))  Blood Culture (routine x 2)     Status: None   Collection Time: 03/20/21  5:57 PM   Specimen: BLOOD LEFT ARM  Result Value Ref Range Status   Specimen Description BLOOD LEFT ARM  Final   Special Requests   Final    BOTTLES DRAWN AEROBIC ONLY Blood Culture adequate volume   Culture   Final    NO GROWTH 5 DAYS Performed at [facility], [address] [place], [place] [address]    Report Status 03/25/2021 FINAL  Final  Blood Culture (routine x 2)     Status: Abnormal   Collection Time: 03/20/21  5:57 PM   Specimen: Right Antecubital; Blood  Result Value Ref Range Status   Specimen Description   Final    RIGHT ANTECUBITAL Performed at [facility], [address], [place], [place] [address]    Special Requests   Final    BOTTLES DRAWN AEROBIC AND ANAEROBIC Blood Culture results may not be optimal due to an inadequate volume of blood received in culture bottles Performed at [facility], [address] [place], [place] [address]    Culture  Setup Time   Final    IN BOTH AEROBIC AND ANAEROBIC BOTTLES GRAM POSITIVE COCCI CRITICAL RESULT CALLED TO, READ BACK BY AND VERIFIED WITH: MILLS,M 1629 03/21/2021 COLEMAN,R CRITICAL RESULT CALLED TO, READ BACK BY  AND VERIFIED WITH: RN T.ALLEN ON [id number] AT 2023 BY E.PARRISH Performed at [facility], [place] [address] [place], [place] [address]    Culture ENTEROCOCCUS FAECALIS (A)  Final   Report Status 03/23/2021 FINAL  Final   Organism ID, Bacteria ENTEROCOCCUS FAECALIS  Final      Susceptibility   Enterococcus faecalis - MIC*    AMPICILLIN <=2 SENSITIVE Sensitive     VANCOMYCIN <=0.5 SENSITIVE Sensitive     GENTAMICIN SYNERGY SENSITIVE Sensitive     * ENTEROCOCCUS FAECALIS  Blood Culture ID Panel (Reflexed)     Status: Abnormal   Collection Time: 03/20/21  5:57 PM  Result Value Ref Range Status   Enterococcus faecalis DETECTED (A) NOT DETECTED Final    Comment: CRITICAL RESULT CALLED TO, READ BACK BY AND VERIFIED WITH: RN T.ALLEN ON [id number] AT 2023 BY E.PARRISH    Enterococcus Faecium NOT DETECTED NOT DETECTED Final   Listeria monocytogenes NOT DETECTED NOT DETECTED Final   Staphylococcus species NOT DETECTED NOT DETECTED Final   Staphylococcus aureus (BCID) NOT DETECTED NOT DETECTED Final   Staphylococcus epidermidis NOT DETECTED NOT DETECTED Final   Staphylococcus lugdunensis NOT DETECTED NOT DETECTED Final   Streptococcus species NOT DETECTED NOT DETECTED Final   Streptococcus agalactiae NOT DETECTED NOT DETECTED Final   Streptococcus pneumoniae NOT DETECTED NOT DETECTED Final   Streptococcus pyogenes NOT DETECTED NOT DETECTED Final   A.calcoaceticus-baumannii NOT DETECTED NOT DETECTED Final   Bacteroides fragilis NOT DETECTED NOT DETECTED Final   Enterobacterales NOT DETECTED NOT DETECTED Final   Enterobacter cloacae complex NOT DETECTED NOT DETECTED Final   Escherichia coli NOT DETECTED NOT DETECTED Final   Klebsiella aerogenes NOT DETECTED NOT DETECTED Final   Klebsiella oxytoca NOT DETECTED NOT DETECTED Final   Klebsiella pneumoniae NOT DETECTED NOT DETECTED Final   Proteus species NOT DETECTED NOT DETECTED Final   Salmonella species NOT DETECTED NOT DETECTED Final    Serratia marcescens NOT DETECTED NOT DETECTED Final   Haemophilus influenzae NOT DETECTED NOT DETECTED Final   Neisseria meningitidis NOT DETECTED NOT DETECTED Final  Pseudomonas aeruginosa NOT DETECTED NOT DETECTED Final   Stenotrophomonas maltophilia NOT DETECTED NOT DETECTED Final   Candida albicans NOT DETECTED NOT DETECTED Final   Candida auris NOT DETECTED NOT DETECTED Final   Candida glabrata NOT DETECTED NOT DETECTED Final   Candida krusei NOT DETECTED NOT DETECTED Final   Candida parapsilosis NOT DETECTED NOT DETECTED Final   Candida tropicalis NOT DETECTED NOT DETECTED Final   Cryptococcus neoformans/gattii NOT DETECTED NOT DETECTED Final   Vancomycin resistance NOT DETECTED NOT DETECTED Final    Comment: Performed at [facility], [place] [address], [place], [place] [address]  Resp Panel by RT-PCR (Flu A&B, Covid) Nasopharyngeal Swab     Status: None   Collection Time: 03/20/21  6:25 PM   Specimen: Nasopharyngeal Swab; Nasopharyngeal(NP) swabs in vial transport medium  Result Value Ref Range Status   SARS Coronavirus 2 by RT PCR NEGATIVE NEGATIVE Final    Comment: (NOTE) SARS-CoV-2 target nucleic acids are NOT DETECTED.  The SARS-CoV-2 RNA is generally detectable in upper respiratory specimens during the acute phase of infection. The lowest concentration of SARS-CoV-2 viral copies this assay can detect is 138 copies/mL. A negative result does not preclude SARS-Cov-2 infection and should not be used as the sole basis for treatment or other patient management decisions. A negative result may occur with  improper specimen collection/handling, submission of specimen other than nasopharyngeal swab, presence of viral mutation(s) within the areas targeted by this assay, and inadequate number of viral copies(<138 copies/mL). A negative result must be combined with clinical observations, patient history, and epidemiological information. The expected result is Negative.  Fact  Sheet for Patients:  EntrepreneurPulse.com.au  Fact Sheet for Healthcare Providers:  IncredibleEmployment.be  This test is no t yet approved or cleared by the [place] FDA and  has been authorized for detection and/or diagnosis of SARS-CoV-2 by FDA under an Emergency Use Authorization (EUA). This EUA will remain  in effect (meaning this test can be used) for the duration of the COVID-19 declaration under Section 564(b)(1) of the Act, 21 U.S.C.section 360bbb-3(b)(1), unless the authorization is terminated  or revoked sooner.       Influenza A by PCR NEGATIVE NEGATIVE Final   Influenza B by PCR NEGATIVE NEGATIVE Final    Comment: (NOTE) The Xpert Xpress SARS-CoV-2/FLU/RSV plus assay is intended as an aid in the diagnosis of influenza from Nasopharyngeal swab specimens and should not be used as a sole basis for treatment. Nasal washings and aspirates are unacceptable for Xpert Xpress SARS-CoV-2/FLU/RSV testing.  Fact Sheet for Patients: EntrepreneurPulse.com.au  Fact Sheet for Healthcare Providers: IncredibleEmployment.be  This test is not yet approved or cleared by the [place] FDA and has been authorized for detection and/or diagnosis of SARS-CoV-2 by FDA under an Emergency Use Authorization (EUA). This EUA will remain in effect (meaning this test can be used) for the duration of the COVID-19 declaration under Section 564(b)(1) of the Act, 21 U.S.C. section 360bbb-3(b)(1), unless the authorization is terminated or revoked.  Performed at [facility], [address] [place], [place] [address]   MRSA Next Gen by PCR, Nasal     Status: None   Collection Time: 03/20/21 11:37 PM   Specimen: Nasal Mucosa; Nasal Swab  Result Value Ref Range Status   MRSA by PCR Next Gen NOT DETECTED NOT DETECTED Final    Comment: (NOTE) The GeneXpert MRSA Assay (FDA approved for NASAL specimens only), is one  component of a comprehensive MRSA colonization surveillance program.  It is not intended to diagnose MRSA infection nor to guide or monitor treatment for MRSA infections. Test performance is not FDA approved in patients less than 66 years old. Performed at [facility], [address] [place], [place] [address]   Urine Culture     Status: Abnormal   Collection Time: 03/22/21  8:27 AM   Specimen: Urine, Clean Catch  Result Value Ref Range Status   Specimen Description   Final    URINE, CLEAN CATCH Performed at [facility], [address], [place], [place] [address]    Special Requests   Final    NONE Performed at [facility], [address], [place], [place] [address]    Culture MULTIPLE SPECIES PRESENT, SUGGEST RECOLLECTION (A)  Final   Report Status 03/23/2021 FINAL  Final  Culture, blood (Routine X 2) w Reflex to ID Panel     Status: None (Preliminary result)   Collection Time: 03/22/21 12:07 PM   Specimen: BLOOD RIGHT ARM  Result Value Ref Range Status   Specimen Description BLOOD RIGHT ARM BOTTLES DRAWN AEROBIC ONLY  Final   Special Requests Blood Culture adequate volume  Final   Culture   Final    NO GROWTH 4 DAYS Performed at [facility], [address] [place], [place] [address]    Report Status PENDING  Incomplete  Culture, blood (Routine X 2) w Reflex to ID Panel     Status: None (Preliminary result)   Collection Time: 03/22/21 12:22 PM   Specimen: BLOOD RIGHT HAND  Result Value Ref Range Status   Specimen Description BLOOD RIGHT HAND BOTTLES DRAWN AEROBIC ONLY  Final   Special Requests Blood Culture adequate volume  Final   Culture   Final    NO GROWTH 4 DAYS Performed at [facility], [address], [place], [place] [address]    Report Status PENDING  Incomplete  Urine Culture     Status: None   Collection Time: 03/24/21  5:00 AM   Specimen: Urine, Clean Catch  Result Value Ref Range Status   Specimen Description   Final    URINE, CLEAN CATCH Performed at  [facility], [address], [place], [place] [address]    Special Requests   Final    NONE Performed at [facility], [address], [place], [place] [address]    Culture   Final    NO GROWTH Performed at [facility], [place] [address], [place], [place] [address]    Report Status 03/26/2021 FINAL  Final     Radiology Studies: No results found.  Scheduled Meds:  apixaban  5 mg Oral BID   atorvastatin  80 mg Oral Daily   budesonide (PULMICORT) nebulizer solution  0.5 mg Nebulization BID   Chlorhexidine Gluconate Cloth  6 each Topical Daily   clopidogrel  75 mg Oral Daily   dextromethorphan-guaiFENesin  1 tablet Oral BID   escitalopram  10 mg Oral Daily   insulin aspart  0-5 Units Subcutaneous QHS   insulin aspart  0-9 Units Subcutaneous TID WC   insulin glargine-yfgn  5 Units Subcutaneous Daily   ipratropium  0.5 mg Nebulization TID   levalbuterol  0.63 mg Nebulization TID   losartan  12.5 mg Oral Daily   metoprolol succinate  25 mg Oral Daily   phosphorus  500 mg Oral BID   ranolazine  500 mg Oral BID   spironolactone  12.5 mg Oral Daily   tamsulosin  0.4 mg Oral QPC  supper   Continuous Infusions:  ampicillin (OMNIPEN) IV 2 g (03/26/21 1255)   cefTRIAXone (ROCEPHIN)  IV 2 g (03/26/21 1030)    LOS: 6 days   Time spent 38 mins   Janann Boeve, MD How to contact the [facility] Attending or Consulting provider [place] or covering provider during after hours [place], for this patient?  Check the care team in [facility] and look for a) attending/consulting [facility] provider listed and b) the [facility] team listed Log into [email or web address] and use [place]'s universal password to access. If you do not have the password, please contact the hospital operator. Locate the [facility] provider you are looking for under [facility] and page to a number that you can be directly reached. If you still have difficulty reaching the provider, please page the [facility] (Director on Call) for the Hospitalists  listed on amion for assistance.  03/26/2021, 4:46 PM

## 2021-03-27 ENCOUNTER — Inpatient Hospital Stay (HOSPITAL_COMMUNITY): Payer: Medicare Other | Admitting: Anesthesiology

## 2021-03-27 ENCOUNTER — Encounter (HOSPITAL_COMMUNITY)
Admission: EM | Disposition: A | Discharge status: Skilled Nursing Facility | Payer: Self-pay | Source: Home / Self Care | Attending: Family Medicine

## 2021-03-27 ENCOUNTER — Inpatient Hospital Stay (HOSPITAL_COMMUNITY): Payer: Medicare Other

## 2021-03-27 ENCOUNTER — Encounter (HOSPITAL_COMMUNITY): Payer: Self-pay | Admitting: Family Medicine

## 2021-03-27 DIAGNOSIS — I213 ST elevation (STEMI) myocardial infarction of unspecified site: Secondary | ICD-10-CM | POA: Diagnosis not present

## 2021-03-27 DIAGNOSIS — R7881 Bacteremia: Secondary | ICD-10-CM | POA: Diagnosis not present

## 2021-03-27 DIAGNOSIS — C3490 Malignant neoplasm of unspecified part of unspecified bronchus or lung: Secondary | ICD-10-CM | POA: Diagnosis not present

## 2021-03-27 DIAGNOSIS — B952 Enterococcus as the cause of diseases classified elsewhere: Secondary | ICD-10-CM | POA: Diagnosis not present

## 2021-03-27 DIAGNOSIS — I361 Nonrheumatic tricuspid (valve) insufficiency: Secondary | ICD-10-CM | POA: Diagnosis not present

## 2021-03-27 DIAGNOSIS — I214 Non-ST elevation (NSTEMI) myocardial infarction: Secondary | ICD-10-CM

## 2021-03-27 DIAGNOSIS — I340 Nonrheumatic mitral (valve) insufficiency: Secondary | ICD-10-CM

## 2021-03-27 DIAGNOSIS — N1831 Chronic kidney disease, stage 3a: Secondary | ICD-10-CM | POA: Diagnosis not present

## 2021-03-27 DIAGNOSIS — I4891 Unspecified atrial fibrillation: Secondary | ICD-10-CM | POA: Diagnosis not present

## 2021-03-27 HISTORY — PX: TEE WITHOUT CARDIOVERSION: SHX5443

## 2021-03-27 LAB — ECHO TEE
AV Mean grad: 2 mmHg
AV Peak grad: 4.7 mmHg
Ao pk vel: 1.08 m/s

## 2021-03-27 LAB — GLUCOSE, CAPILLARY
Glucose-Capillary: 152 mg/dL — ABNORMAL HIGH (ref 70–99)
Glucose-Capillary: 136 mg/dL — ABNORMAL HIGH (ref 70–99)
Glucose-Capillary: 119 mg/dL — ABNORMAL HIGH (ref 70–99)
Glucose-Capillary: 172 mg/dL — ABNORMAL HIGH (ref 70–99)

## 2021-03-27 LAB — CULTURE, BLOOD (ROUTINE X 2)
Culture: NO GROWTH
Culture: NO GROWTH
Special Requests: ADEQUATE
Special Requests: ADEQUATE

## 2021-03-27 LAB — CBC
HCT: 28.8 % — ABNORMAL LOW (ref 39.0–52.0)
Hemoglobin: 9.3 g/dL — ABNORMAL LOW (ref 13.0–17.0)
MCH: 24.8 pg — ABNORMAL LOW (ref 26.0–34.0)
MCHC: 32.3 g/dL (ref 30.0–36.0)
MCV: 76.8 fL — ABNORMAL LOW (ref 80.0–100.0)
Platelets: 218 10*3/uL (ref 150–400)
RBC: 3.75 MIL/uL — ABNORMAL LOW (ref 4.22–5.81)
RDW: 16.4 % — ABNORMAL HIGH (ref 11.5–15.5)
WBC: 5.2 10*3/uL (ref 4.0–10.5)
nRBC: 0 % (ref 0.0–0.2)

## 2021-03-27 SURGERY — ECHOCARDIOGRAM, TRANSESOPHAGEAL
Anesthesia: General

## 2021-03-27 MED ORDER — LACTATED RINGERS IV SOLN: INTRAVENOUS | Status: DC | PRN

## 2021-03-27 MED ORDER — PROPOFOL 10 MG/ML IV BOLUS
INTRAVENOUS | Status: AC
  Filled 2021-03-27: qty 40

## 2021-03-27 MED ORDER — PROPOFOL 10 MG/ML IV BOLUS
INTRAVENOUS | Status: DC | PRN
  Administered 2021-03-27: 50 mg via INTRAVENOUS
  Administered 2021-03-27: 20 mg via INTRAVENOUS

## 2021-03-27 MED ORDER — FUROSEMIDE 40 MG PO TABS
40.0000 mg | ORAL_TABLET | Freq: Once | ORAL | Status: AC
  Administered 2021-03-27: 40 mg via ORAL
  Filled 2021-03-27: qty 1

## 2021-03-27 MED ORDER — BUTAMBEN-TETRACAINE-BENZOCAINE 2-2-14 % EX AERO
INHALATION_SPRAY | CUTANEOUS | Status: AC
  Filled 2021-03-27: qty 5

## 2021-03-27 MED ORDER — ETOMIDATE 2 MG/ML IV SOLN
INTRAVENOUS | Status: DC | PRN
  Administered 2021-03-27: 4 mg via INTRAVENOUS

## 2021-03-27 MED ORDER — SACUBITRIL-VALSARTAN 24-26 MG PO TABS
1.0000 | ORAL_TABLET | Freq: Two times a day (BID) | ORAL | Status: DC
  Administered 2021-03-27 – 2021-03-30 (×6): 1 via ORAL
  Filled 2021-03-27 (×7): qty 1

## 2021-03-27 NOTE — Progress Notes (Signed)
  Echocardiogram Echocardiogram Transesophageal has been performed.  Matilde Bash 03/27/2021, 10:58 AM

## 2021-03-27 NOTE — CV Procedure (Signed)
CV Procedure Note  Procedure: Transesophageal echocardiogram Physician: Dr Carlyle Dolly MD Indication: Bacteremia  Patient was brought to the procedure suite after appropriate consent was obtained. Sedation was achieved with the assistance of anesthesiology, please see there documentation for details. Patient was placed in the left lateral decutibus position and bite block placed. TEE probe intubated into esophagus without difficulty and several images obtained. Please see full TEE report for complete findings. In summary no evidence of vegetations or endocarditis. Cardiopulmonary monitoring performed throughout out procedure,patient tolerated procedure well without complications.    Carlyle Dolly MD

## 2021-03-27 NOTE — Anesthesia Postprocedure Evaluation (Signed)
Anesthesia Post Note  Patient: [name]  Procedure(s) Performed: TRANSESOPHAGEAL ECHOCARDIOGRAM (TEE)  Patient location during evaluation: PACU Anesthesia Type: General Level of consciousness: awake and alert and oriented Pain management: pain level controlled Vital Signs Assessment: post-procedure vital signs reviewed and stable Respiratory status: spontaneous breathing, nonlabored ventilation and respiratory function stable Cardiovascular status: blood pressure returned to baseline and stable Postop Assessment: no apparent nausea or vomiting Anesthetic complications: no   No notable events documented.   Last Vitals:  Vitals:   03/27/21 1110 03/27/21 1130  BP:  138/77  Pulse: 77 78  Resp: (!) 22 20  Temp:  36.4 C  SpO2: 99% 100%    Last Pain:  Vitals:   03/27/21 1110  TempSrc:   PainSc: 0-No pain                 Tanga Gloor C Brecklynn Jian

## 2021-03-27 NOTE — Care Management Important Message (Signed)
Important Message  Patient Details  Name: [name] MRN: [id number] Date of Birth: Jul 24, 1940   Medicare Important Message Given:  Yes     Tommy Medal 03/27/2021, 1:10 PM

## 2021-03-27 NOTE — H&P (Signed)
Plans for TEE with anesthesiology for bactemia. Please see referenced rounding note below for full medical history. WIll plan for procedure later this morning   Carlyle Dolly MD     Progress Note   Patient Name: [name] Date of Encounter: 03/26/2021   [facility] Cardiologist: Kate Sable, MD (Inactive)    Subjective  No complaints     Inpatient Medications    Scheduled Meds:  apixaban  5 mg Oral BID   atorvastatin  80 mg Oral Daily   budesonide (PULMICORT) nebulizer solution  0.5 mg Nebulization BID   Chlorhexidine Gluconate Cloth  6 each Topical Daily   clopidogrel  75 mg Oral Daily   dextromethorphan-guaiFENesin  1 tablet Oral BID   escitalopram  10 mg Oral Daily   insulin aspart  0-5 Units Subcutaneous QHS   insulin aspart  0-9 Units Subcutaneous TID WC   insulin glargine-yfgn  5 Units Subcutaneous Daily   ipratropium  0.5 mg Nebulization TID   levalbuterol  0.63 mg Nebulization TID   metoprolol succinate  25 mg Oral Daily   phosphorus  500 mg Oral BID   ranolazine  500 mg Oral BID   spironolactone  12.5 mg Oral Daily   tamsulosin  0.4 mg Oral QPC supper    Continuous Infusions:  ampicillin (OMNIPEN) IV 2 g (03/26/21 0641)   cefTRIAXone (ROCEPHIN)  IV 2 g (03/26/21 1030)    PRN Meds: acetaminophen **OR** acetaminophen, levalbuterol, nitroGLYCERIN, ondansetron **OR** ondansetron (ZOFRAN) IV, polyethylene glycol    Vital Signs          Vitals:    03/25/21 2124 03/26/21 0025 03/26/21 0548 03/26/21 0821  BP:   131/65 (!) 109/47    Pulse:   81 85    Resp:   19 18    Temp:   97.7 F (36.5 C) 97.8 F (36.6 C)    TempSrc:   Oral      SpO2: 94% 95% 92% 93%  Weight:     89.4 kg    Height:              Intake/Output Summary (Last 24 hours) at 03/26/2021 1046 Last data filed at 03/26/2021 0900    Gross per 24 hour  Intake 615.93 ml  Output 900 ml  Net -284.07 ml    Last 3 Weights 03/26/2021 03/25/2021 03/24/2021  Weight (lbs) 197 lb 1.5  oz 217 lb 6 oz 184 lb 1.4 oz  Weight (kg) 89.4 kg 98.6 kg 83.5 kg       Telemetry    Paroxysmal afib, intermittent SR - Personally Reviewed   ECG    N/a - Personally Reviewed   Physical Exam    GEN: No acute distress.   Neck: No JVD Cardiac: RRR, no murmurs, rubs, or gallops.  Respiratory: Clear to auscultation bilaterally. GI: Soft, nontender, non-distended  MS: No edema; No deformity. Neuro:  Nonfocal  Psych: Normal affect    Labs    High Sensitivity Troponin:   Last Labs          Recent Labs  Lab 03/20/21 2004 03/20/21 2210 03/21/21 0017 03/21/21 0236 03/21/21 0418  TROPONINIHS 3,016* 3,762* 4,413* 6,544* 8,310*       Chemistry Last Labs             Recent Labs  Lab 03/20/21 2210 03/21/21 5284 03/22/21 0140 03/22/21 0140 03/23/21 0458 03/24/21 0421 03/25/21 0413 03/26/21 0541  NA 127*   < > 128*  --  127* 130* 133* 133*  K 3.3*   < > 3.2*  --  3.8 3.8 3.3* 3.7  CL 98   < > 102  --  103 105 103 102  CO2 17*   < > 20*  --  19* 21* 22 25  GLUCOSE 218*   < > 195*  --  200* 164* 111* 140*  BUN 14   < > 15  --  11 9 8  7*  CREATININE 1.34*   < > 1.23  --  1.20 1.22 1.22 1.30*  CALCIUM 7.1*   < > 7.1*  --  6.9* 6.9* 6.9* 7.1*  MG  --   --   --    < > 1.8 1.7 2.0 1.9  PROT 6.4*  --  5.7*  --  5.7*  --   --   --   ALBUMIN 2.0*  --  1.7*  --  1.6*  --   --   --   AST 27  --  40  --  21  --   --   --   ALT 12  --  14  --  11  --   --   --   ALKPHOS 100  --  80  --  74  --   --   --   BILITOT 1.1  --  0.6  --  0.6  --   --   --   GFRNONAA 53*   < > 59*  --  >60 60* 60* 55*  ANIONGAP 12   < > 6  --  5 4* 8 6   < > = values in this interval not displayed.      Lipids  Last Labs   No results for input(s): CHOL, TRIG, HDL, LABVLDL, LDLCALC, CHOLHDL in the last 168 hours.    Hematology Last Labs        Recent Labs  Lab 03/24/21 0421 03/25/21 0413 03/26/21 0541  WBC 8.5 6.6 6.2  RBC 3.37* 3.23* 3.35*  HGB 8.5* 8.1* 8.5*  HCT 26.0* 24.1* 26.2*   MCV 77.2* 74.6* 78.2*  MCH 25.2* 25.1* 25.4*  MCHC 32.7 33.6 32.4  RDW 15.6* 15.4 16.0*  PLT 214 209 217      Thyroid  Last Labs   No results for input(s): TSH, FREET4 in the last 168 hours.    BNP Last Labs   No results for input(s): BNP, PROBNP in the last 168 hours.    DDimer  Last Labs   No results for input(s): DDIMER in the last 168 hours.      Radiology    Imaging Results (Last 48 hours)  No results found.     Cardiac Studies      Patient Profile     LENELL LAMA is a 80 y.o. 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   Assessment & Plan    1.CAD/STEMI/ACute systolic HF - history of CABG in 2008 - cath 2019 showed severe native 3 vessel disease, patent LIMA-LAD, 99% SVG-Diag, multilple high grade stenosis SVG-OM and SVG-RCA. From cath note PCI of limited benefit given multiple vein graft lesions in old SVGs. Patient was evaluated for redo CABG however turned down due to metastatic cancer history.  - presentd with inferior STEMI,  patient refused transfer and cath on admission. - given his prior anatomy likely poor revasc options   - trop up to 8300 without established peak - echo LVE 51-76%, normal diastolic, normal RV. Mild MR. Inferior hypokinesis.    -medically managed STEMI.Completed 48 hrs heparin.  He is on atorva 80, plavix 75, toprol 25, aldactone 12.5mg , ranexa 500mg  bid (no ASA since on eliquis, plan for eliquis and plavix) - soft bp's at times, though elevated most of yesterday. Start losartan 12.5mg  daily, may transition to entresto pending bp's. Possible SGLT2i prior to discharge       2.Metastatic lung cancer     3.Bacteremia/Severe sepsis - being treated for bacteremia and pneumonia by primary team - primary team has asked for TEE  when patient more medically stable.    -plan for TEE tomorrow   4. Afib - new diagnosis this admission in setting of STEMI - on toprol 25mg  daily - has been paroxysmal this admission in and out of afib, rates have been controlled.  - CHADS2Vasc score is 6 (CHF, HTN, age x 2, DM2, CAD).On eliquis     4.DNR/DNI - palliative following patient, DNR/DNI but conitnue medical treatments.    For questions or updates, please contact [place] Please consult [email or web address] for contact info under         Signed, Carlyle Dolly, MD  03/26/2021, 10:46 AM

## 2021-03-27 NOTE — Progress Notes (Signed)
PROGRESS NOTE   [name]  PJK:[id number] DOB: [birth year] DOA: 03/20/2021 PCP: [name], MD   Chief Complaint  Patient presents with   Fever   Level of care: Telemetry  Brief Admission History:  80 y.o. male with hx of metastatic lung cancer currently on palliative immunotherapy, CAD status post four-vessel CABG, CKD, type 2 diabetes, who presents with chest pain.  Patient reports that he has felt unwell for several days.  His chest has been hurting but since just prior to presenting to the ED he has felt like an elephant is sitting on his chest.  He also feels very short of breath currently.  Also notes having a significant cough as well as being very weak.  Has not had much to eat or drink in the past couple of days either.  He was admitted with STEMI that is being managed medically.    Assessment & Plan:   Active Problems:   Type 2 diabetes mellitus without complication, without long-term current use of insulin (HCC)   CKD (chronic kidney disease) stage 3, GFR 30-59 ml/min (HCC)   S/P CABG x 4   Adenocarcinoma of lung, stage 4 (HCC)   STEMI (ST elevation myocardial infarction) ([place])   Hyponatremia   Sepsis ([place])   Sepsis due to Enterococcus ([place])   Bacteremia due to Enterococcus   Atrial fibrillation with rapid ventricular response ([place])   STEMI - Pt is being medically managed.   - He completed a course of IV heparin and nitroglycerin - continue statin therapy.  - continue plavix; DC ASA when starting apixaban - Echo 01/09/21: EF 55-60%, no WMA, normal RV, mild MR - ranexa BID - cardiology team following.   Severe Sepsis - RESOLVED -Sepsis physiology is resolved now -Secondary to E faecalis bacteremia and pneumonia -Lactic acidosis has resolved -Continue IV antibiotics as ordered  E faecalis bacteremia -Repeated blood cultures have been no growth to date -Planning for TEE when okay with cardiology team likely 03/27/21  -Continue IV ampicillin as ordered -  duration of therapy 5-7 days if TEE negative -TEE completed 11/11 and no evidence of vegetations seen.  - ampicillin day 6/7 ceftriaxone day 8, plan to discontinue antibiotics 11/12  Lobar pneumonia  - treated with cefepime, now on ampicillin, ceftriaxone -MRSA negative   Acute respiratory failure with hypoxia -Weaned to room air if possible  Paroxysmal atrial fibrillation -Okay with cardiology to transition to apixaban today -Consult to Pharm.D. for apixaban, DC aspirin, continue Plavix -Bleeding precautions -CHA2DS2-VASc score equals 6 -Continue metoprolol for rate control  Stage IIIb CKD -Stable  Acute urinary retention -DC foley and do voiding trial 11/10   Hematuria - suspect related to foley placement, following  - cbc in AM  Metastatic lung adenocarcinoma -He is followed closely by Dr. Delton Coombes and currently holding immunotherapy in setting of acute infection -Outpatient follow-up with Dr. Delton Coombes has been scheduled for 11/28  Type 2 diabetes mellitus with vascular complications -Uncontrolled as evidenced by hemoglobin A1c of 7.3% -Holding home Amaryl and metformin and Actos -Currently being managed with low-dose glargine CBG (last 3)  Recent Labs    03/26/21 2044 03/27/21 0724 03/27/21 1050  GLUCAP 144* 152* 136*  ' Essential hypertension -Controlled on current medications continue to follow  Hyponatremia - stable   Hypophosphatemia -This is been repleted  DNR present on admission -Patient wishes to continue DNR orders inpatient -Appreciate palliative medicine team for goals of care discussion  DVT prophylaxis: Apixaban Code Status: Full Family  Communication: update to patient at bedside, verbalized understanding Disposition: SNF when bed available  Status is: Inpatient  Remains inpatient appropriate because: He remains on IV antibiotics for bacteremia     Consultants:  Cardiology Palliative medicine  Procedures:  Tentatively TEE for  11/11  Antimicrobials:     Subjective: Pt tolerated TEE well, does not like food in hospital.   Objective: Vitals:   03/27/21 1048 03/27/21 1100 03/27/21 1110 03/27/21 1130  BP: 115/71 129/78  138/77  Pulse: 79 77 77 78  Resp: (!) 21 (!) 23 (!) 22 20  Temp: 98 F (36.7 C)   97.6 F (36.4 C)  TempSrc:      SpO2: 95% 95% 99% 100%  Weight:      Height:        Intake/Output Summary (Last 24 hours) at 03/27/2021 1532 Last data filed at 03/27/2021 1039 Gross per 24 hour  Intake 1820.21 ml  Output 700 ml  Net 1120.21 ml   Filed Weights   03/25/21 0424 03/26/21 0548 03/27/21 0500  Weight: 98.6 kg 89.4 kg 89.6 kg    Examination:  General exam: Chronically ill-appearing male lying in the bed he is awake and alert in no apparent distress appears calm and comfortable  Respiratory system: Upper anterior congestion noises heard. Respiratory effort normal. Cardiovascular system: normal S1 & S2 heard. No JVD, murmurs, rubs, gallops or clicks. No pedal edema. Gastrointestinal system: Abdomen is nondistended, soft and nontender. No organomegaly or masses felt. Normal bowel sounds heard. Central nervous system: Alert and oriented. No focal neurological deficits. Extremities: Symmetric 5 x 5 power. Skin: No rashes, lesions or ulcers Psychiatry: Judgement and insight appear poor. Mood & affect appropriate.   Data Reviewed: I have personally reviewed following labs and imaging studies  CBC: Recent Labs  Lab 03/20/21 1757 03/21/21 0418 03/23/21 0458 03/24/21 0421 03/25/21 0413 03/26/21 0541 03/27/21 0529  WBC 19.5*   < > 8.7 8.5 6.6 6.2 5.2  NEUTROABS 16.6*  --   --   --   --   --   --   HGB 11.6*   < > 8.4* 8.5* 8.1* 8.5* 9.3*  HCT 35.3*   < > 26.3* 26.0* 24.1* 26.2* 28.8*  MCV 75.8*   < > 78.5* 77.2* 74.6* 78.2* 76.8*  PLT 407*   < > 253 214 209 217 218   < > = values in this interval not displayed.    Basic Metabolic Panel: Recent Labs  Lab 03/22/21 0140  03/23/21 0458 03/24/21 0421 03/25/21 0413 03/26/21 0541  NA 128* 127* 130* 133* 133*  K 3.2* 3.8 3.8 3.3* 3.7  CL 102 103 105 103 102  CO2 20* 19* 21* 22 25  GLUCOSE 195* 200* 164* 111* 140*  BUN 15 11 9 8  7*  CREATININE 1.23 1.20 1.22 1.22 1.30*  CALCIUM 7.1* 6.9* 6.9* 6.9* 7.1*  MG  --  1.8 1.7 2.0 1.9  PHOS  --  2.1* 3.1 4.4  --     GFR: Estimated Creatinine Clearance: 47.5 mL/min (A) (by C-G formula based on SCr of 1.3 mg/dL (H)).  Liver Function Tests: Recent Labs  Lab 03/20/21 1757 03/20/21 2210 03/22/21 0140 03/23/21 0458  AST 32 27 40 21  ALT 14 12 14 11   ALKPHOS 130* 100 80 74  BILITOT 1.1 1.1 0.6 0.6  PROT 8.3* 6.4* 5.7* 5.7*  ALBUMIN 2.6* 2.0* 1.7* 1.6*    CBG: Recent Labs  Lab 03/26/21 1111 03/26/21 1609 03/26/21 2044 03/27/21  2947 03/27/21 1050  GLUCAP 152* 97 144* 152* 136*    Recent Results (from the past 240 hour(s))  Blood Culture (routine x 2)     Status: None   Collection Time: 03/20/21  5:57 PM   Specimen: BLOOD LEFT ARM  Result Value Ref Range Status   Specimen Description BLOOD LEFT ARM  Final   Special Requests   Final    BOTTLES DRAWN AEROBIC ONLY Blood Culture adequate volume   Culture   Final    NO GROWTH 5 DAYS Performed at [facility], [address] [place], [place] [address]    Report Status 03/25/2021 FINAL  Final  Blood Culture (routine x 2)     Status: Abnormal   Collection Time: 03/20/21  5:57 PM   Specimen: Right Antecubital; Blood  Result Value Ref Range Status   Specimen Description   Final    RIGHT ANTECUBITAL Performed at [facility], [address], [place], [place] [address]    Special Requests   Final    BOTTLES DRAWN AEROBIC AND ANAEROBIC Blood Culture results may not be optimal due to an inadequate volume of blood received in culture bottles Performed at [facility], [address] [place], [place] [address]    Culture  Setup Time   Final    IN BOTH AEROBIC AND ANAEROBIC BOTTLES GRAM POSITIVE  [facility] CRITICAL RESULT CALLED TO, READ BACK BY AND VERIFIED WITH: MILLS,M 1629 03/21/2021 COLEMAN,R CRITICAL RESULT CALLED TO, READ BACK BY AND VERIFIED WITH: RN T.ALLEN ON [id number] AT 2023 BY E.PARRISH Performed at [facility], [place] [address], [place], [place] [address]    Culture ENTEROCOCCUS FAECALIS (A)  Final   Report Status 03/23/2021 FINAL  Final   Organism ID, Bacteria ENTEROCOCCUS FAECALIS  Final      Susceptibility   Enterococcus faecalis - MIC*    AMPICILLIN <=2 SENSITIVE Sensitive     VANCOMYCIN <=0.5 SENSITIVE Sensitive     GENTAMICIN SYNERGY SENSITIVE Sensitive     * ENTEROCOCCUS FAECALIS  Blood Culture ID Panel (Reflexed)     Status: Abnormal   Collection Time: 03/20/21  5:57 PM  Result Value Ref Range Status   Enterococcus faecalis DETECTED (A) NOT DETECTED Final    Comment: CRITICAL RESULT CALLED TO, READ BACK BY AND VERIFIED WITH: RN T.ALLEN ON [id number] AT 2023 BY E.PARRISH    Enterococcus Faecium NOT DETECTED NOT DETECTED Final   Listeria monocytogenes NOT DETECTED NOT DETECTED Final   Staphylococcus species NOT DETECTED NOT DETECTED Final   Staphylococcus aureus (BCID) NOT DETECTED NOT DETECTED Final   Staphylococcus epidermidis NOT DETECTED NOT DETECTED Final   Staphylococcus lugdunensis NOT DETECTED NOT DETECTED Final   Streptococcus species NOT DETECTED NOT DETECTED Final   Streptococcus agalactiae NOT DETECTED NOT DETECTED Final   Streptococcus pneumoniae NOT DETECTED NOT DETECTED Final   Streptococcus pyogenes NOT DETECTED NOT DETECTED Final   A.calcoaceticus-baumannii NOT DETECTED NOT DETECTED Final   Bacteroides fragilis NOT DETECTED NOT DETECTED Final   Enterobacterales NOT DETECTED NOT DETECTED Final   Enterobacter cloacae complex NOT DETECTED NOT DETECTED Final   Escherichia coli NOT DETECTED NOT DETECTED Final   Klebsiella aerogenes NOT DETECTED NOT DETECTED Final   Klebsiella oxytoca NOT DETECTED NOT DETECTED Final   Klebsiella pneumoniae  NOT DETECTED NOT DETECTED Final   Proteus species NOT DETECTED NOT DETECTED Final   Salmonella species NOT DETECTED NOT DETECTED Final   Serratia marcescens NOT DETECTED NOT DETECTED Final   Haemophilus influenzae  NOT DETECTED NOT DETECTED Final   Neisseria meningitidis NOT DETECTED NOT DETECTED Final   Pseudomonas aeruginosa NOT DETECTED NOT DETECTED Final   Stenotrophomonas maltophilia NOT DETECTED NOT DETECTED Final   Candida albicans NOT DETECTED NOT DETECTED Final   Candida auris NOT DETECTED NOT DETECTED Final   Candida glabrata NOT DETECTED NOT DETECTED Final   Candida krusei NOT DETECTED NOT DETECTED Final   Candida parapsilosis NOT DETECTED NOT DETECTED Final   Candida tropicalis NOT DETECTED NOT DETECTED Final   Cryptococcus neoformans/gattii NOT DETECTED NOT DETECTED Final   Vancomycin resistance NOT DETECTED NOT DETECTED Final    Comment: Performed at [facility], [place] [address], [place], [place] [address]  Resp Panel by RT-PCR (Flu A&B, Covid) Nasopharyngeal Swab     Status: None   Collection Time: 03/20/21  6:25 PM   Specimen: Nasopharyngeal Swab; Nasopharyngeal(NP) swabs in vial transport medium  Result Value Ref Range Status   SARS Coronavirus 2 by RT PCR NEGATIVE NEGATIVE Final    Comment: (NOTE) SARS-CoV-2 target nucleic acids are NOT DETECTED.  The SARS-CoV-2 RNA is generally detectable in upper respiratory specimens during the acute phase of infection. The lowest concentration of SARS-CoV-2 viral copies this assay can detect is 138 copies/mL. A negative result does not preclude SARS-Cov-2 infection and should not be used as the sole basis for treatment or other patient management decisions. A negative result may occur with  improper specimen collection/handling, submission of specimen other than nasopharyngeal swab, presence of viral mutation(s) within the areas targeted by this assay, and inadequate number of viral copies(<138 copies/mL). A negative  result must be combined with clinical observations, patient history, and epidemiological information. The expected result is Negative.  Fact Sheet for Patients:  EntrepreneurPulse.com.au  Fact Sheet for Healthcare Providers:  IncredibleEmployment.be  This test is no t yet approved or cleared by the [place] FDA and  has been authorized for detection and/or diagnosis of SARS-CoV-2 by FDA under an Emergency Use Authorization (EUA). This EUA will remain  in effect (meaning this test can be used) for the duration of the COVID-19 declaration under Section 564(b)(1) of the Act, 21 U.S.C.section 360bbb-3(b)(1), unless the authorization is terminated  or revoked sooner.       Influenza A by PCR NEGATIVE NEGATIVE Final   Influenza B by PCR NEGATIVE NEGATIVE Final    Comment: (NOTE) The Xpert Xpress SARS-CoV-2/FLU/RSV plus assay is intended as an aid in the diagnosis of influenza from Nasopharyngeal swab specimens and should not be used as a sole basis for treatment. Nasal washings and aspirates are unacceptable for Xpert Xpress SARS-CoV-2/FLU/RSV testing.  Fact Sheet for Patients: EntrepreneurPulse.com.au  Fact Sheet for Healthcare Providers: IncredibleEmployment.be  This test is not yet approved or cleared by the [place] FDA and has been authorized for detection and/or diagnosis of SARS-CoV-2 by FDA under an Emergency Use Authorization (EUA). This EUA will remain in effect (meaning this test can be used) for the duration of the COVID-19 declaration under Section 564(b)(1) of the Act, 21 U.S.C. section 360bbb-3(b)(1), unless the authorization is terminated or revoked.  Performed at [facility], [address], [place], [place] [address]   MRSA Next Gen by PCR, Nasal     Status: None   Collection Time: 03/20/21 11:37 PM   Specimen: Nasal Mucosa; Nasal Swab  Result Value Ref Range Status   MRSA by  PCR Next Gen NOT DETECTED NOT DETECTED Final    Comment: (NOTE) The GeneXpert MRSA Assay (  FDA approved for NASAL specimens only), is one component of a comprehensive MRSA colonization surveillance program. It is not intended to diagnose MRSA infection nor to guide or monitor treatment for MRSA infections. Test performance is not FDA approved in patients less than 29 years old. Performed at [facility], [address], [place], [place] [address]   Urine Culture     Status: Abnormal   Collection Time: 03/22/21  8:27 AM   Specimen: Urine, Clean Catch  Result Value Ref Range Status   Specimen Description   Final    URINE, CLEAN CATCH Performed at [facility], [address] [place], [place] [address]    Special Requests   Final    NONE Performed at [facility], [address], [place], [place] [address]    Culture MULTIPLE SPECIES PRESENT, SUGGEST RECOLLECTION (A)  Final   Report Status 03/23/2021 FINAL  Final  Culture, blood (Routine X 2) w Reflex to ID Panel     Status: None   Collection Time: 03/22/21 12:07 PM   Specimen: BLOOD RIGHT ARM  Result Value Ref Range Status   Specimen Description BLOOD RIGHT ARM BOTTLES DRAWN AEROBIC ONLY  Final   Special Requests Blood Culture adequate volume  Final   Culture   Final    NO GROWTH 5 DAYS Performed at [facility], [address] [place], [place] [address]    Report Status 03/27/2021 FINAL  Final  Culture, blood (Routine X 2) w Reflex to ID Panel     Status: None   Collection Time: 03/22/21 12:22 PM   Specimen: BLOOD RIGHT HAND  Result Value Ref Range Status   Specimen Description BLOOD RIGHT HAND BOTTLES DRAWN AEROBIC ONLY  Final   Special Requests Blood Culture adequate volume  Final   Culture   Final    NO GROWTH 5 DAYS Performed at [facility], [address] [place], [place] [address]    Report Status 03/27/2021 FINAL  Final  Urine Culture     Status: None   Collection Time: 03/24/21  5:00 AM   Specimen: Urine, Clean  Catch  Result Value Ref Range Status   Specimen Description   Final    URINE, CLEAN CATCH Performed at [facility], [address], [place], [place] [address]    Special Requests   Final    NONE Performed at [facility], [address] [place], [place] [address]    Culture   Final    NO GROWTH Performed at [facility], [place] [address], [place], [place] [address]    Report Status 03/26/2021 FINAL  Final     Radiology Studies: ECHO TEE  Result Date: 03/27/2021    TRANSESOPHOGEAL ECHO REPORT   Patient Name:   [name] Date of Exam: 03/27/2021 Medical Rec #:  [id number]     Height:       71.0 in Accession #:    [id number]    Weight:       197.5 lb Date of Birth:  Jul 16, 1940     BSA:          2.097 m Patient Age:    14 years      BP:           115/71 mmHg Patient Gender: M             HR:           79 bpm. Exam Location:  [facility] Procedure: Transesophageal Echo, Cardiac Doppler  and Color Doppler Indications:     Bacteremia  History:         Patient has prior history of Echocardiogram examinations, most                  recent 03/21/2021. CAD and Previous Myocardial Infarction, Prior                  CABG, Arrythmias:Atrial Fibrillation,                  Signs/Symptoms:Bacteremia; Risk Factors:Diabetes and                  Hypertension. Lung cancer, Pneumonia.  Sonographer:     Dustin Flock RDCS Referring Phys:  [id number] Erma Heritage Diagnosing Phys: Carlyle Dolly MD PROCEDURE: The transesophogeal probe was passed without difficulty through the esophogus of the patient. Sedation performed by different physician. The patient was monitored while under deep sedation. Anesthestetic sedation was provided intravenously by Anesthesiology: 79mg  of Propofol. The patient developed no complications during the procedure. IMPRESSIONS  1. Left ventricular ejection fraction, by estimation, is 35 to 40%. The left ventricle has moderately decreased function.  2. Right ventricular systolic  function is normal. The right ventricular size is mildly enlarged.  3. Left atrial size was severely dilated. No left atrial/left atrial appendage thrombus was detected. The LAA emptying velocity was 56 cm/s.  4. Right atrial size was severely dilated.  5. The mitral valve is abnormal. Moderate mitral valve regurgitation. No evidence of mitral stenosis.  6. The tricuspid valve is abnormal. Tricuspid valve regurgitation is moderate.  7. The aortic valve is tricuspid. Aortic valve regurgitation is not visualized. No aortic stenosis is present.  8. There is mild (Grade II) plaque. Conclusion(s)/Recommendation(s): No evidence of vegetation/infective endocarditis on this transesophageael echocardiogram. FINDINGS  Left Ventricle: Left ventricular ejection fraction, by estimation, is 35 to 40%. The left ventricle has moderately decreased function. The left ventricular internal cavity size was normal in size. Right Ventricle: The right ventricular size is mildly enlarged. No increase in right ventricular wall thickness. Right ventricular systolic function is normal. Left Atrium: Left atrial size was severely dilated. No left atrial/left atrial appendage thrombus was detected. The LAA emptying velocity was 56 cm/s. Right Atrium: Right atrial size was severely dilated. Pericardium: There is no evidence of pericardial effusion. Mitral Valve: The MR vena contracta is 0.4 cm. The mitral valve is abnormal. Moderate mitral valve regurgitation. No evidence of mitral valve stenosis. Tricuspid Valve: The tricuspid valve is abnormal. Tricuspid valve regurgitation is moderate . No evidence of tricuspid stenosis. Aortic Valve: The aortic valve is tricuspid. Aortic valve regurgitation is not visualized. No aortic stenosis is present. Aortic valve mean gradient measures 2.0 mmHg. Aortic valve peak gradient measures 4.7 mmHg. Pulmonic Valve: The pulmonic valve was normal in structure. Pulmonic valve regurgitation is mild. No evidence of  pulmonic stenosis. Aorta: The aortic root is normal in size and structure. There is mild (Grade II) plaque. IAS/Shunts: No atrial level shunt detected by color flow Doppler.  AORTIC VALVE AV Vmax:           108.00 cm/s AV Vmean:          72.700 cm/s AV VTI:            0.218 m AV Peak Grad:      4.7 mmHg AV Mean Grad:      2.0 mmHg LVOT Vmax:         78.00 cm/s  LVOT Vmean:        50.300 cm/s LVOT VTI:          0.139 m LVOT/AV VTI ratio: 0.64  AORTA Ao Root diam: 3.60 cm  SHUNTS Systemic VTI: 0.14 m Carlyle Dolly MD Electronically signed by Carlyle Dolly MD Signature Date/Time: 03/27/2021/11:50:29 AM    Final     Scheduled Meds:  apixaban  5 mg Oral BID   atorvastatin  80 mg Oral Daily   budesonide (PULMICORT) nebulizer solution  0.5 mg Nebulization BID   Chlorhexidine Gluconate Cloth  6 each Topical Daily   clopidogrel  75 mg Oral Daily   dextromethorphan-guaiFENesin  1 tablet Oral BID   escitalopram  10 mg Oral Daily   insulin aspart  0-5 Units Subcutaneous QHS   insulin aspart  0-9 Units Subcutaneous TID WC   insulin glargine-yfgn  5 Units Subcutaneous Daily   ipratropium  0.5 mg Nebulization TID   levalbuterol  0.63 mg Nebulization TID   metoprolol succinate  25 mg Oral Daily   phosphorus  500 mg Oral BID   ranolazine  500 mg Oral BID   sacubitril-valsartan  1 tablet Oral BID   spironolactone  12.5 mg Oral Daily   tamsulosin  0.4 mg Oral QPC supper   Continuous Infusions:  ampicillin (OMNIPEN) IV 2 g (03/27/21 1518)   cefTRIAXone (ROCEPHIN)  IV 2 g (03/27/21 1202)    LOS: 7 days   Time spent 37 mins   Sheron Tallman Wynetta Emery, MD How to contact the [facility] Attending or Consulting provider [place] or covering provider during after hours [place], for this patient?  Check the care team in [facility] and look for a) attending/consulting TR[facility] provider listed and b) the [facility] team listed Log into [email or web address] and use [place]'s universal password to access. If you do not have the password, please  contact the hospital operator. Locate the [facility] provider you are looking for under [facility] and page to a number that you can be directly reached. If you still have difficulty reaching the provider, please page the [facility] (Director on Call) for the Hospitalists listed on amion for assistance.  03/27/2021, 3:32 PM

## 2021-03-27 NOTE — Progress Notes (Addendum)
Progress Note  Patient Name: [name] Date of Encounter: 03/27/2021  [facility] Cardiologist: Kate Sable, MD (Inactive)   Subjective   No complaints  Inpatient Medications    Scheduled Meds:  apixaban  5 mg Oral BID   atorvastatin  80 mg Oral Daily   budesonide (PULMICORT) nebulizer solution  0.5 mg Nebulization BID   Chlorhexidine Gluconate Cloth  6 each Topical Daily   clopidogrel  75 mg Oral Daily   dextromethorphan-guaiFENesin  1 tablet Oral BID   escitalopram  10 mg Oral Daily   insulin aspart  0-5 Units Subcutaneous QHS   insulin aspart  0-9 Units Subcutaneous TID WC   insulin glargine-yfgn  5 Units Subcutaneous Daily   ipratropium  0.5 mg Nebulization TID   levalbuterol  0.63 mg Nebulization TID   losartan  12.5 mg Oral Daily   metoprolol succinate  25 mg Oral Daily   phosphorus  500 mg Oral BID   ranolazine  500 mg Oral BID   spironolactone  12.5 mg Oral Daily   tamsulosin  0.4 mg Oral QPC supper   Continuous Infusions:  ampicillin (OMNIPEN) IV 2 g (03/27/21 2836)   cefTRIAXone (ROCEPHIN)  IV Stopped (03/26/21 2206)   PRN Meds: acetaminophen **OR** acetaminophen, levalbuterol, nitroGLYCERIN, ondansetron **OR** ondansetron (ZOFRAN) IV, polyethylene glycol   Vital Signs    Vitals:   03/26/21 2044 03/27/21 0359 03/27/21 0500 03/27/21 0807  BP: (!) 127/92 137/82    Pulse: 82 86    Resp: 19 19    Temp: 98.2 F (36.8 C) 98.2 F (36.8 C)    TempSrc:      SpO2: 95% 95%  96%  Weight:   89.6 kg   Height:        Intake/Output Summary (Last 24 hours) at 03/27/2021 0905 Last data filed at 03/27/2021 0600 Gross per 24 hour  Intake 1860.21 ml  Output 700 ml  Net 1160.21 ml   Last 3 Weights 03/27/2021 03/26/2021 03/25/2021  Weight (lbs) 197 lb 8.5 oz 197 lb 1.5 oz 217 lb 6 oz  Weight (kg) 89.6 kg 89.4 kg 98.6 kg      Telemetry    Rate controlled afib - Personally Reviewed  ECG    N/a - Personally Reviewed  Physical Exam   GEN:  No acute distress.   Neck: No JVD Cardiac: irreg  Respiratory: Clear to auscultation bilaterally. GI: Soft, nontender, non-distended  MS: 1+bilateral edema No deformity. Neuro:  Nonfocal  Psych: Normal affect   Labs    High Sensitivity Troponin:   Recent Labs  Lab 03/20/21 2004 03/20/21 2210 03/21/21 0017 03/21/21 0236 03/21/21 0418  TROPONINIHS 3,016* 3,762* 4,413* 6,544* 8,310*     Chemistry Recent Labs  Lab 03/20/21 2210 03/21/21 6294 03/22/21 0140 03/22/21 0140 03/23/21 0458 03/24/21 0421 03/25/21 0413 03/26/21 0541  NA 127*   < > 128*  --  127* 130* 133* 133*  K 3.3*   < > 3.2*  --  3.8 3.8 3.3* 3.7  CL 98   < > 102  --  103 105 103 102  CO2 17*   < > 20*  --  19* 21* 22 25  GLUCOSE 218*   < > 195*  --  200* 164* 111* 140*  BUN 14   < > 15  --  11 9 8  7*  CREATININE 1.34*   < > 1.23  --  1.20 1.22 1.22 1.30*  CALCIUM 7.1*   < > 7.1*  --  6.9* 6.9* 6.9* 7.1*  MG  --   --   --    < > 1.8 1.7 2.0 1.9  PROT 6.4*  --  5.7*  --  5.7*  --   --   --   ALBUMIN 2.0*  --  1.7*  --  1.6*  --   --   --   AST 27  --  40  --  21  --   --   --   ALT 12  --  14  --  11  --   --   --   ALKPHOS 100  --  80  --  74  --   --   --   BILITOT 1.1  --  0.6  --  0.6  --   --   --   GFRNONAA 53*   < > 59*  --  >60 60* 60* 55*  ANIONGAP 12   < > 6  --  5 4* 8 6   < > = values in this interval not displayed.    Lipids No results for input(s): CHOL, TRIG, HDL, LABVLDL, LDLCALC, CHOLHDL in the last 168 hours.  Hematology Recent Labs  Lab 03/25/21 0413 03/26/21 0541 03/27/21 0529  WBC 6.6 6.2 5.2  RBC 3.23* 3.35* 3.75*  HGB 8.1* 8.5* 9.3*  HCT 24.1* 26.2* 28.8*  MCV 74.6* 78.2* 76.8*  MCH 25.1* 25.4* 24.8*  MCHC 33.6 32.4 32.3  RDW 15.4 16.0* 16.4*  PLT 209 217 218   Thyroid No results for input(s): TSH, FREET4 in the last 168 hours.  BNPNo results for input(s): BNP, PROBNP in the last 168 hours.  DDimer No results for input(s): DDIMER in the last 168 hours.   Radiology     No results found.  Cardiac Studies    Patient Profile     [name] is a 80 y.o. 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  Assessment & Plan    1.CAD/STEMI/ACute systolic HF - history of CABG in 2008 - cath 2019 showed severe native 3 vessel disease, patent LIMA-LAD, 99% SVG-Diag, multilple high grade stenosis SVG-OM and SVG-RCA. From cath note PCI of limited benefit given multiple vein graft lesions in old SVGs. Patient was evaluated for redo CABG however turned down due to metastatic cancer history.  - presentd with inferior STEMI, patient refused transfer and cath on admission. - given his prior anatomy likely poor revasc options   - trop up to 8300 without established peak - echo LVE 08-14%, normal diastolic, normal RV. Mild MR. Inferior hypokinesis.    -medically managed STEMI.Completed 48 hrs heparin.  He is on atorva 80, plavix 75, toprol 25, aldactone 12.5mg , losartan 12.5 mg, ranexa 500mg  bid (no ASA since on eliquis, plan for eliquis and plavix) - I think his bp's will tolerate entresto, transition from losartan to entresot 24/26mg  bid - consider SLGT2i at f/u  - some increased LE edema this AM, dose IV lasix 40mg  x 1.        2.Metastatic lung cancer     3.Bacteremia/Severe sepsis/Pneumonia - being treated for bacteremia and pneumonia by primary team - primary team has asked for TEE when patient more medically stable.    -plan for TEE tomorrow   4. Afib - new  diagnosis this admission in setting of STEMI - on toprol 25mg  daily - has been paroxysmal this admission in and out of afib, rates have been controlled.  - CHADS2Vasc score is 6 (CHF, HTN, age x 2, DM2, CAD).On eliquis  - continue current meds     4.DNR/DNI - palliative following  patient, DNR/DNI but conitnue medical treatments.   For questions or updates, please contact [place] Please consult [email or web address] for contact info under        Signed, Carlyle Dolly, MD  03/27/2021, 9:05 AM

## 2021-03-27 NOTE — Transfer of Care (Signed)
Immediate Anesthesia Transfer of Care Note  Patient: [name]  Procedure(s) Performed: TRANSESOPHAGEAL ECHOCARDIOGRAM (TEE)  Patient Location: PACU  Anesthesia Type:General  Level of Consciousness: drowsy  Airway & Oxygen Therapy: Patient Spontanous Breathing and Patient connected to nasal cannula oxygen  Post-op Assessment: Report given to RN and Post -op Vital signs reviewed and stable  Post vital signs: Reviewed and stable  Last Vitals:  Vitals Value Taken Time  BP 113/68   Temp    Pulse 82   Resp 20   SpO2 95%     Last Pain:  Vitals:   03/27/21 0932  TempSrc: Oral  PainSc:          Complications: No notable events documented.

## 2021-03-27 NOTE — Anesthesia Preprocedure Evaluation (Addendum)
Anesthesia Evaluation  Patient identified by MRN, date of birth, ID band Patient awake    Reviewed: Allergy & Precautions, NPO status , Patient's Chart, lab work & pertinent test results, reviewed documented beta blocker date and time   Airway Mallampati: II  TM Distance: >3 FB Neck ROM: Full    Dental  (+) Edentulous Upper, Edentulous Lower   Pulmonary former smoker,  Right spontaneous pneumothorax  Shallow breathing   breath sounds clear to auscultation + decreased breath sounds      Cardiovascular Exercise Tolerance: Poor hypertension, Pt. on medications and Pt. on home beta blockers + angina + CAD, + Past MI and + CABG  Normal cardiovascular exam Rhythm:Regular Rate:Normal  22-Mar-2021 08:08:33 [facility]-AP-300 ROUTINE RECORD [id number] (61 yr) Male Caucasian Vent. rate 82 BPM PR interval 154 ms QRS duration 82 ms QT/QTcB 394/460 ms P-R-T axes -21 19 14  Sinus rhythm with Premature supraventricular complexes Low voltage QRS ST & T wave abnormality, consider anterior ischemia Prolonged QT Abnormal ECG Premature atrial complexes , new ST changes are improved Confirmed by Shelva Majestic [phone]) on 03/22/2021 6:01:17 PM  1. Distal septal, apical mid and basal inferior wall hypokinesis . Left ventricular ejection fraction, by estimation, is 35 to 40%. The left  ventricle has moderately decreased function. The left ventricle demonstrates regional wall motion abnormalities  (see scoring diagram/findings for description). Left ventricular diastolic parameters were normal.  2. Right ventricular systolic function is normal. The right ventricular size is normal. There is moderately elevated pulmonary artery systolic pressure.  3. Left atrial size was moderately dilated.  4. 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 The mitral valve is abnormal. Mild mitral valve regurgitation. No evidence of mitral stenosis.  5. The aortic valve is  tricuspid. There is mild calcification of the aortic valve. Aortic valve regurgitation is not visualized. Mild aortic valve sclerosis is present, with no evidence of aortic valve stenosis.  6. The inferior vena cava is normal in size with greater than 50% respiratory variability, suggesting right atrial pressure of 3 mmHg.    Neuro/Psych negative neurological ROS  negative psych ROS   GI/Hepatic negative GI ROS, Neg liver ROS,   Endo/Other  diabetes, Well Controlled, Type 2, Oral Hypoglycemic Agents  Renal/GU Renal InsufficiencyRenal disease     Musculoskeletal negative musculoskeletal ROS (+)   Abdominal   Peds  Hematology negative hematology ROS (+)   Anesthesia Other Findings Right nasal bleeding  Reproductive/Obstetrics negative OB ROS                           Anesthesia Physical Anesthesia Plan  ASA: 4  Anesthesia Plan: General   Post-op Pain Management:    Induction: Intravenous  PONV Risk Score and Plan: TIVA  Airway Management Planned: Nasal Cannula and Natural Airway  Additional Equipment:   Intra-op Plan:   Post-operative Plan:   Informed Consent: I have reviewed the patients History and Physical, chart, labs and discussed the procedure including the risks, benefits and alternatives for the proposed anesthesia with the patient or authorized representative who has indicated his/her understanding and acceptance.    Discussed DNR with patient and Suspend DNR.   Dental advisory given  Plan Discussed with: CRNA and Surgeon  Anesthesia Plan Comments:        Anesthesia Quick Evaluation

## 2021-03-27 NOTE — Progress Notes (Signed)
Notified Dr. Wynetta Emery of urinary retention and bladder scan of 397 ml. Received order to place foley. Laretta Bolster, LPN inserted with RN assisting. Obtained 400 ml of urine with dark red blood. Patient tolerated well. Sterile technique maintained throughout.

## 2021-03-28 DIAGNOSIS — C3490 Malignant neoplasm of unspecified part of unspecified bronchus or lung: Secondary | ICD-10-CM | POA: Diagnosis not present

## 2021-03-28 DIAGNOSIS — N1831 Chronic kidney disease, stage 3a: Secondary | ICD-10-CM | POA: Diagnosis not present

## 2021-03-28 DIAGNOSIS — I214 Non-ST elevation (NSTEMI) myocardial infarction: Secondary | ICD-10-CM | POA: Diagnosis not present

## 2021-03-28 DIAGNOSIS — R7881 Bacteremia: Secondary | ICD-10-CM | POA: Diagnosis not present

## 2021-03-28 LAB — BASIC METABOLIC PANEL
Anion gap: 6 (ref 5–15)
BUN: 6 mg/dL — ABNORMAL LOW (ref 8–23)
CO2: 27 mmol/L (ref 22–32)
Calcium: 7.2 mg/dL — ABNORMAL LOW (ref 8.9–10.3)
Chloride: 100 mmol/L (ref 98–111)
Creatinine, Ser: 1.23 mg/dL (ref 0.61–1.24)
GFR, Estimated: 59 mL/min — ABNORMAL LOW (ref >60)
Glucose, Bld: 177 mg/dL — ABNORMAL HIGH (ref 70–99)
Potassium: 3 mmol/L — ABNORMAL LOW (ref 3.5–5.1)
Sodium: 133 mmol/L — ABNORMAL LOW (ref 135–145)

## 2021-03-28 LAB — CBC
HCT: 28 % — ABNORMAL LOW (ref 39.0–52.0)
Hemoglobin: 9.1 g/dL — ABNORMAL LOW (ref 13.0–17.0)
MCH: 25.5 pg — ABNORMAL LOW (ref 26.0–34.0)
MCHC: 32.5 g/dL (ref 30.0–36.0)
MCV: 78.4 fL — ABNORMAL LOW (ref 80.0–100.0)
Platelets: 191 10*3/uL (ref 150–400)
RBC: 3.57 MIL/uL — ABNORMAL LOW (ref 4.22–5.81)
RDW: 16.7 % — ABNORMAL HIGH (ref 11.5–15.5)
WBC: 5.6 10*3/uL (ref 4.0–10.5)
nRBC: 0 % (ref 0.0–0.2)

## 2021-03-28 LAB — OCCULT BLOOD X 1 CARD TO LAB, STOOL: Fecal Occult Bld: NEGATIVE

## 2021-03-28 LAB — GLUCOSE, CAPILLARY
Glucose-Capillary: 154 mg/dL — ABNORMAL HIGH (ref 70–99)
Glucose-Capillary: 162 mg/dL — ABNORMAL HIGH (ref 70–99)
Glucose-Capillary: 110 mg/dL — ABNORMAL HIGH (ref 70–99)
Glucose-Capillary: 166 mg/dL — ABNORMAL HIGH (ref 70–99)

## 2021-03-28 LAB — MAGNESIUM: Magnesium: 1.7 mg/dL (ref 1.7–2.4)

## 2021-03-28 MED ORDER — POTASSIUM CHLORIDE CRYS ER 20 MEQ PO TBCR
60.0000 meq | EXTENDED_RELEASE_TABLET | Freq: Once | ORAL | Status: AC
  Administered 2021-03-28: 60 meq via ORAL
  Filled 2021-03-28: qty 3

## 2021-03-28 NOTE — Progress Notes (Signed)
PROGRESS NOTE   [name]  BSW:[id number] DOB: Dec 22, 1940 DOA: 03/20/2021 PCP: [name], MD   Chief Complaint  Patient presents with   Fever   Level of care: Telemetry  Brief Admission History:  80 y.o. male with hx of metastatic lung cancer currently on palliative immunotherapy, CAD status post four-vessel CABG, CKD, type 2 diabetes, who presents with chest pain.  Patient reports that he has felt unwell for several days.  His chest has been hurting but since just prior to presenting to the ED he has felt like an elephant is sitting on his chest.  He also feels very short of breath currently.  Also notes having a significant cough as well as being very weak.  Has not had much to eat or drink in the past couple of days either.  He was admitted with STEMI that is being managed medically.    Assessment & Plan:   Active Problems:   Type 2 diabetes mellitus without complication, without long-term current use of insulin (HCC)   CKD (chronic kidney disease) stage 3, GFR 30-59 ml/min (HCC)   S/P CABG x 4   Adenocarcinoma of lung, stage 4 (HCC)   STEMI (ST elevation myocardial infarction) ([place])   Hyponatremia   Sepsis ([place])   Sepsis due to Enterococcus ([place])   Bacteremia due to Enterococcus   Atrial fibrillation with rapid ventricular response ([place])   NSTEMI (non-ST elevated myocardial infarction) ([place])   STEMI - Pt is being medically managed.   - He completed a course of IV heparin and nitroglycerin - continue statin therapy.  - continue plavix; DC ASA when starting apixaban - Echo 01/09/21: EF 55-60%, no WMA, normal RV, mild MR - ranexa BID - cardiology team following and plan is to manage medically.  - Pt to go to SNF on Monday 11/14   Severe Sepsis - RESOLVED -Sepsis physiology is resolved now -Secondary to E faecalis bacteremia and pneumonia -Lactic acidosis has resolved -Continue IV antibiotics thru 11/12.   E faecalis bacteremia -Repeated blood cultures have been no  growth to date -Planning for TEE when okay with cardiology team likely 03/27/21  -Continue IV ampicillin as ordered - duration of therapy 5-7 days if TEE negative -TEE completed 11/11 and no evidence of vegetations seen.  - ampicillin day 6/7 ceftriaxone day 8, plan to discontinue antibiotics 11/12  Lobar pneumonia - TREATED  - treated with cefepime, completed ampicillin, ceftriaxone on 11/12 -MRSA negative   Acute respiratory failure with hypoxia -Weaned to room air if possible  Paroxysmal atrial fibrillation -Okay with cardiology to transition to apixaban  -Consult to Pharm.D. for apixaban, DC aspirin, continue Plavix per cardiology -Bleeding precautions -CHA2DS2-VASc score equals 6 -Continue metoprolol for rate control  Stage IIIb CKD -Stable  Acute urinary retention -DC foley and do voiding trial 11/10  - PT Failed voiding trial - Foley had to be re-inserted 11/11 - continue flomax as ordered - DC with foley in place and do outpatient voiding trial and urology follow up in 1-2 weeks post discharge  Hematuria - suspect related to foley placement, following  - CBC Hg has been stable.  Appears to be cleared up today.   Metastatic lung adenocarcinoma -He is followed closely by Dr. Delton Coombes and currently holding immunotherapy in setting of acute infection -Outpatient follow-up with Dr. Delton Coombes has been scheduled for 11/28  Type 2 diabetes mellitus with vascular complications -Uncontrolled as evidenced by hemoglobin A1c of 7.3% -Holding home Amaryl and metformin and Actos -  Currently being managed with low-dose glargine CBG (last 3)  Recent Labs    03/27/21 2012 03/28/21 0800 03/28/21 1207  GLUCAP 172* 154* 162*  ' Essential hypertension -Controlled on current medications continue to follow  Hyponatremia - stable   Hypophosphatemia -This is been repleted  DNR present on admission -Patient wishes to continue DNR orders inpatient -Appreciate palliative  medicine team for goals of care discussion  DVT prophylaxis: Apixaban Code Status: Full Family Communication: wife called 11/12 to update  Disposition: SNF when bed available  Status is: Inpatient  Remains inpatient appropriate because: He remains on IV antibiotics for bacteremia     Consultants:  Cardiology Palliative medicine  Procedures:  Tentatively TEE for 11/11  Antimicrobials:   See above   Subjective: Pt denies having any chest pain or shortness of breath.    Objective: Vitals:   03/27/21 2050 03/28/21 0457 03/28/21 0500 03/28/21 0804  BP:  131/70    Pulse:  93    Resp:  18    Temp:  97.8 F (36.6 C)    TempSrc:      SpO2: 94% 96%  96%  Weight:   89.8 kg   Height:        Intake/Output Summary (Last 24 hours) at 03/28/2021 1219 Last data filed at 03/28/2021 0900 Gross per 24 hour  Intake 1550 ml  Output 2150 ml  Net -600 ml   Filed Weights   03/26/21 0548 03/27/21 0500 03/28/21 0500  Weight: 89.4 kg 89.6 kg 89.8 kg    Examination:  General exam: Chronically ill-appearing male lying in the bed he is awake and alert in no apparent distress appears calm and comfortable  Respiratory system: Upper anterior congestion noises heard. Respiratory effort normal. Cardiovascular system: normal S1 & S2 heard. No JVD, murmurs, rubs, gallops or clicks. No pedal edema. Gastrointestinal system: Abdomen is nondistended, soft and nontender. No organomegaly or masses felt. Normal bowel sounds heard. Central nervous system: Alert and oriented. No focal neurological deficits. Extremities: Symmetric 5 x 5 power. Skin: No rashes, lesions or ulcers Psychiatry: Judgement and insight appear poor. Mood & affect appropriate.   Data Reviewed: I have personally reviewed following labs and imaging studies  CBC: Recent Labs  Lab 03/24/21 0421 03/25/21 0413 03/26/21 0541 03/27/21 0529 03/28/21 0442  WBC 8.5 6.6 6.2 5.2 5.6  HGB 8.5* 8.1* 8.5* 9.3* 9.1*  HCT 26.0* 24.1*  26.2* 28.8* 28.0*  MCV 77.2* 74.6* 78.2* 76.8* 78.4*  PLT 214 209 217 218 875    Basic Metabolic Panel: Recent Labs  Lab 03/23/21 0458 03/24/21 0421 03/25/21 0413 03/26/21 0541 03/28/21 0442  NA 127* 130* 133* 133* 133*  K 3.8 3.8 3.3* 3.7 3.0*  CL 103 105 103 102 100  CO2 19* 21* 22 25 27   GLUCOSE 200* 164* 111* 140* 177*  BUN 11 9 8  7* 6*  CREATININE 1.20 1.22 1.22 1.30* 1.23  CALCIUM 6.9* 6.9* 6.9* 7.1* 7.2*  MG 1.8 1.7 2.0 1.9 1.7  PHOS 2.1* 3.1 4.4  --   --     GFR: Estimated Creatinine Clearance: 50.2 mL/min (by C-G formula based on SCr of 1.23 mg/dL).  Liver Function Tests: Recent Labs  Lab 03/22/21 0140 03/23/21 0458  AST 40 21  ALT 14 11  ALKPHOS 80 74  BILITOT 0.6 0.6  PROT 5.7* 5.7*  ALBUMIN 1.7* 1.6*    CBG: Recent Labs  Lab 03/27/21 1050 03/27/21 1615 03/27/21 2012 03/28/21 0800 03/28/21 1207  GLUCAP 136* 119*  172* 154* 162*    Recent Results (from the past 240 hour(s))  Blood Culture (routine x 2)     Status: None   Collection Time: 03/20/21  5:57 PM   Specimen: BLOOD LEFT ARM  Result Value Ref Range Status   Specimen Description BLOOD LEFT ARM  Final   Special Requests   Final    BOTTLES DRAWN AEROBIC ONLY Blood Culture adequate volume   Culture   Final    NO GROWTH 5 DAYS Performed at [facility], [address], [place], [place] [address]    Report Status 03/25/2021 FINAL  Final  Blood Culture (routine x 2)     Status: Abnormal   Collection Time: 03/20/21  5:57 PM   Specimen: Right Antecubital; Blood  Result Value Ref Range Status   Specimen Description   Final    RIGHT ANTECUBITAL Performed at [facility], [address] [place], [place] [address]    Special Requests   Final    BOTTLES DRAWN AEROBIC AND ANAEROBIC Blood Culture results may not be optimal due to an inadequate volume of blood received in culture bottles Performed at [facility], [address], [place], [place] [address]    Culture  Setup Time   Final    IN  BOTH AEROBIC AND ANAEROBIC BOTTLES GRAM POSITIVE COCCI CRITICAL RESULT CALLED TO, READ BACK BY AND VERIFIED WITH: MILLS,M 1629 03/21/2021 COLEMAN,R CRITICAL RESULT CALLED TO, READ BACK BY AND VERIFIED WITH: RN T.ALLEN ON [id number] AT 2023 BY E.PARRISH Performed at [facility], [place] [address], [place],  [address]    Culture ENTEROCOCCUS FAECALIS (A)  Final   Report Status 03/23/2021 FINAL  Final   Organism ID, Bacteria ENTEROCOCCUS FAECALIS  Final      Susceptibility   Enterococcus faecalis - MIC*    AMPICILLIN <=2 SENSITIVE Sensitive     VANCOMYCIN <=0.5 SENSITIVE Sensitive     GENTAMICIN SYNERGY SENSITIVE Sensitive     * ENTEROCOCCUS FAECALIS  Blood Culture ID Panel (Reflexed)     Status: Abnormal   Collection Time: 03/20/21  5:57 PM  Result Value Ref Range Status   Enterococcus faecalis DETECTED (A) NOT DETECTED Final    Comment: CRITICAL RESULT CALLED TO, READ BACK BY AND VERIFIED WITH: RN T.ALLEN ON [id number] AT 2023 BY E.PARRISH    Enterococcus Faecium NOT DETECTED NOT DETECTED Final   Listeria monocytogenes NOT DETECTED NOT DETECTED Final   Staphylococcus species NOT DETECTED NOT DETECTED Final   Staphylococcus aureus (BCID) NOT DETECTED NOT DETECTED Final   Staphylococcus epidermidis NOT DETECTED NOT DETECTED Final   Staphylococcus lugdunensis NOT DETECTED NOT DETECTED Final   Streptococcus species NOT DETECTED NOT DETECTED Final   Streptococcus agalactiae NOT DETECTED NOT DETECTED Final   Streptococcus pneumoniae NOT DETECTED NOT DETECTED Final   Streptococcus pyogenes NOT DETECTED NOT DETECTED Final   A.calcoaceticus-baumannii NOT DETECTED NOT DETECTED Final   Bacteroides fragilis NOT DETECTED NOT DETECTED Final   Enterobacterales NOT DETECTED NOT DETECTED Final   Enterobacter cloacae complex NOT DETECTED NOT DETECTED Final   Escherichia coli NOT DETECTED NOT DETECTED Final   Klebsiella aerogenes NOT DETECTED NOT DETECTED Final   Klebsiella oxytoca NOT  DETECTED NOT DETECTED Final   Klebsiella pneumoniae NOT DETECTED NOT DETECTED Final   Proteus species NOT DETECTED NOT DETECTED Final   Salmonella species NOT DETECTED NOT DETECTED Final   Serratia marcescens NOT DETECTED NOT DETECTED Final   Haemophilus influenzae NOT DETECTED NOT DETECTED Final  Neisseria meningitidis NOT DETECTED NOT DETECTED Final   Pseudomonas aeruginosa NOT DETECTED NOT DETECTED Final   Stenotrophomonas maltophilia NOT DETECTED NOT DETECTED Final   Candida albicans NOT DETECTED NOT DETECTED Final   Candida auris NOT DETECTED NOT DETECTED Final   Candida glabrata NOT DETECTED NOT DETECTED Final   Candida krusei NOT DETECTED NOT DETECTED Final   Candida parapsilosis NOT DETECTED NOT DETECTED Final   Candida tropicalis NOT DETECTED NOT DETECTED Final   Cryptococcus neoformans/gattii NOT DETECTED NOT DETECTED Final   Vancomycin resistance NOT DETECTED NOT DETECTED Final    Comment: Performed at [facility], [place] [address] [place], [place] [address]  Resp Panel by RT-PCR (Flu A&B, Covid) Nasopharyngeal Swab     Status: None   Collection Time: 03/20/21  6:25 PM   Specimen: Nasopharyngeal Swab; Nasopharyngeal(NP) swabs in vial transport medium  Result Value Ref Range Status   SARS Coronavirus 2 by RT PCR NEGATIVE NEGATIVE Final    Comment: (NOTE) SARS-CoV-2 target nucleic acids are NOT DETECTED.  The SARS-CoV-2 RNA is generally detectable in upper respiratory specimens during the acute phase of infection. The lowest concentration of SARS-CoV-2 viral copies this assay can detect is 138 copies/mL. A negative result does not preclude SARS-Cov-2 infection and should not be used as the sole basis for treatment or other patient management decisions. A negative result may occur with  improper specimen collection/handling, submission of specimen other than nasopharyngeal swab, presence of viral mutation(s) within the areas targeted by this assay, and inadequate  number of viral copies(<138 copies/mL). A negative result must be combined with clinical observations, patient history, and epidemiological information. The expected result is Negative.  Fact Sheet for Patients:  EntrepreneurPulse.com.au  Fact Sheet for Healthcare Providers:  IncredibleEmployment.be  This test is no t yet approved or cleared by the [place] FDA and  has been authorized for detection and/or diagnosis of SARS-CoV-2 by FDA under an Emergency Use Authorization (EUA). This EUA will remain  in effect (meaning this test can be used) for the duration of the COVID-19 declaration under Section 564(b)(1) of the Act, 21 U.S.C.section 360bbb-3(b)(1), unless the authorization is terminated  or revoked sooner.       Influenza A by PCR NEGATIVE NEGATIVE Final   Influenza B by PCR NEGATIVE NEGATIVE Final    Comment: (NOTE) The Xpert Xpress SARS-CoV-2/FLU/RSV plus assay is intended as an aid in the diagnosis of influenza from Nasopharyngeal swab specimens and should not be used as a sole basis for treatment. Nasal washings and aspirates are unacceptable for Xpert Xpress SARS-CoV-2/FLU/RSV testing.  Fact Sheet for Patients: EntrepreneurPulse.com.au  Fact Sheet for Healthcare Providers: IncredibleEmployment.be  This test is not yet approved or cleared by the [place] FDA and has been authorized for detection and/or diagnosis of SARS-CoV-2 by FDA under an Emergency Use Authorization (EUA). This EUA will remain in effect (meaning this test can be used) for the duration of the COVID-19 declaration under Section 564(b)(1) of the Act, 21 U.S.C. section 360bbb-3(b)(1), unless the authorization is terminated or revoked.  Performed at [facility], [address] [place], [place] [address]   MRSA Next Gen by PCR, Nasal     Status: None   Collection Time: 03/20/21 11:37 PM   Specimen: Nasal Mucosa;  Nasal Swab  Result Value Ref Range Status   MRSA by PCR Next Gen NOT DETECTED NOT DETECTED Final    Comment: (NOTE) The GeneXpert MRSA Assay (FDA approved for NASAL specimens only), is  one component of a comprehensive MRSA colonization surveillance program. It is not intended to diagnose MRSA infection nor to guide or monitor treatment for MRSA infections. Test performance is not FDA approved in patients less than 61 years old. Performed at [facility], [address], [place], [place] [address]   Urine Culture     Status: Abnormal   Collection Time: 03/22/21  8:27 AM   Specimen: Urine, Clean Catch  Result Value Ref Range Status   Specimen Description   Final    URINE, CLEAN CATCH Performed at [facility], [address], [place], [place] [address]    Special Requests   Final    NONE Performed at [facility], [address] [place], [place] [address]    Culture MULTIPLE SPECIES PRESENT, SUGGEST RECOLLECTION (A)  Final   Report Status 03/23/2021 FINAL  Final  Culture, blood (Routine X 2) w Reflex to ID Panel     Status: None   Collection Time: 03/22/21 12:07 PM   Specimen: BLOOD RIGHT ARM  Result Value Ref Range Status   Specimen Description BLOOD RIGHT ARM BOTTLES DRAWN AEROBIC ONLY  Final   Special Requests Blood Culture adequate volume  Final   Culture   Final    NO GROWTH 5 DAYS Performed at [facility], [address], [place], [place] [address]    Report Status 03/27/2021 FINAL  Final  Culture, blood (Routine X 2) w Reflex to ID Panel     Status: None   Collection Time: 03/22/21 12:22 PM   Specimen: BLOOD RIGHT HAND  Result Value Ref Range Status   Specimen Description BLOOD RIGHT HAND BOTTLES DRAWN AEROBIC ONLY  Final   Special Requests Blood Culture adequate volume  Final   Culture   Final    NO GROWTH 5 DAYS Performed at [facility], [address] [place], [place] [address]    Report Status 03/27/2021 FINAL  Final  Urine Culture     Status: None   Collection  Time: 03/24/21  5:00 AM   Specimen: Urine, Clean Catch  Result Value Ref Range Status   Specimen Description   Final    URINE, CLEAN CATCH Performed at [facility], [address], [place], [place] [address]    Special Requests   Final    NONE Performed at [facility], [address] [place], [place] [address]    Culture   Final    NO GROWTH Performed at [facility], [place] [address] [place],  [address]    Report Status 03/26/2021 FINAL  Final     Radiology Studies: ECHO TEE  Result Date: 03/27/2021    TRANSESOPHOGEAL ECHO REPORT   Patient Name:   [name] Date of Exam: 03/27/2021 Medical Rec #:  [id number]     Height:       71.0 in Accession #:    [id number]    Weight:       197.5 lb Date of Birth:  1941/03/03     BSA:          2.097 m Patient Age:    56 years      BP:           115/71 mmHg Patient Gender: M             HR:           79 bpm. Exam Location:  [facility] Procedure: Transesophageal Echo, Cardiac Doppler and Color Doppler Indications:  Bacteremia  History:         Patient has prior history of Echocardiogram examinations, most                  recent 03/21/2021. CAD and Previous Myocardial Infarction, Prior                  CABG, Arrythmias:Atrial Fibrillation,                  Signs/Symptoms:Bacteremia; Risk Factors:Diabetes and                  Hypertension. Lung cancer, Pneumonia.  Sonographer:     Dustin Flock RDCS Referring Phys:  [id number] Erma Heritage Diagnosing Phys: Carlyle Dolly MD PROCEDURE: The transesophogeal probe was passed without difficulty through the esophogus of the patient. Sedation performed by different physician. The patient was monitored while under deep sedation. Anesthestetic sedation was provided intravenously by Anesthesiology: 79mg  of Propofol. The patient developed no complications during the procedure. IMPRESSIONS  1. Left ventricular ejection fraction, by estimation, is 35 to 40%. The left ventricle has moderately  decreased function.  2. Right ventricular systolic function is normal. The right ventricular size is mildly enlarged.  3. Left atrial size was severely dilated. No left atrial/left atrial appendage thrombus was detected. The LAA emptying velocity was 56 cm/s.  4. Right atrial size was severely dilated.  5. The mitral valve is abnormal. Moderate mitral valve regurgitation. No evidence of mitral stenosis.  6. The tricuspid valve is abnormal. Tricuspid valve regurgitation is moderate.  7. The aortic valve is tricuspid. Aortic valve regurgitation is not visualized. No aortic stenosis is present.  8. There is mild (Grade II) plaque. Conclusion(s)/Recommendation(s): No evidence of vegetation/infective endocarditis on this transesophageael echocardiogram. FINDINGS  Left Ventricle: Left ventricular ejection fraction, by estimation, is 35 to 40%. The left ventricle has moderately decreased function. The left ventricular internal cavity size was normal in size. Right Ventricle: The right ventricular size is mildly enlarged. No increase in right ventricular wall thickness. Right ventricular systolic function is normal. Left Atrium: Left atrial size was severely dilated. No left atrial/left atrial appendage thrombus was detected. The LAA emptying velocity was 56 cm/s. Right Atrium: Right atrial size was severely dilated. Pericardium: There is no evidence of pericardial effusion. Mitral Valve: The MR vena contracta is 0.4 cm. The mitral valve is abnormal. Moderate mitral valve regurgitation. No evidence of mitral valve stenosis. Tricuspid Valve: The tricuspid valve is abnormal. Tricuspid valve regurgitation is moderate . No evidence of tricuspid stenosis. Aortic Valve: The aortic valve is tricuspid. Aortic valve regurgitation is not visualized. No aortic stenosis is present. Aortic valve mean gradient measures 2.0 mmHg. Aortic valve peak gradient measures 4.7 mmHg. Pulmonic Valve: The pulmonic valve was normal in structure.  Pulmonic valve regurgitation is mild. No evidence of pulmonic stenosis. Aorta: The aortic root is normal in size and structure. There is mild (Grade II) plaque. IAS/Shunts: No atrial level shunt detected by color flow Doppler.  AORTIC VALVE AV Vmax:           108.00 cm/s AV Vmean:          72.700 cm/s AV VTI:            0.218 m AV Peak Grad:      4.7 mmHg AV Mean Grad:      2.0 mmHg LVOT Vmax:         78.00 cm/s LVOT Vmean:  50.300 cm/s LVOT VTI:          0.139 m LVOT/AV VTI ratio: 0.64  AORTA Ao Root diam: 3.60 cm  SHUNTS Systemic VTI: 0.14 m Carlyle Dolly MD Electronically signed by Carlyle Dolly MD Signature Date/Time: 03/27/2021/11:50:29 AM    Final     Scheduled Meds:  apixaban  5 mg Oral BID   atorvastatin  80 mg Oral Daily   budesonide (PULMICORT) nebulizer solution  0.5 mg Nebulization BID   Chlorhexidine Gluconate Cloth  6 each Topical Daily   clopidogrel  75 mg Oral Daily   dextromethorphan-guaiFENesin  1 tablet Oral BID   escitalopram  10 mg Oral Daily   insulin aspart  0-5 Units Subcutaneous QHS   insulin aspart  0-9 Units Subcutaneous TID WC   insulin glargine-yfgn  5 Units Subcutaneous Daily   ipratropium  0.5 mg Nebulization TID   levalbuterol  0.63 mg Nebulization TID   metoprolol succinate  25 mg Oral Daily   phosphorus  500 mg Oral BID   ranolazine  500 mg Oral BID   sacubitril-valsartan  1 tablet Oral BID   spironolactone  12.5 mg Oral Daily   tamsulosin  0.4 mg Oral QPC supper   Continuous Infusions:  ampicillin (OMNIPEN) IV 2 g (03/28/21 0727)   cefTRIAXone (ROCEPHIN)  IV 2 g (03/28/21 0953)    LOS: 8 days   Time spent 35 mins   Namir Neto Wynetta Emery, MD How to contact the [facility] Attending or Consulting provider [place] or covering provider during after hours [place], for this patient?  Check the care team in [facility] and look for a) attending/consulting [facility] provider listed and b) the [facility] team listed Log into [email or web address] and use [place]'s universal password to  access. If you do not have the password, please contact the hospital operator. Locate the [facility] provider you are looking for under [facility] and page to a number that you can be directly reached. If you still have difficulty reaching the provider, please page the [facility] (Director on Call) for the Hospitalists listed on amion for assistance.  03/28/2021, 12:19 PM

## 2021-03-28 NOTE — Progress Notes (Signed)
Patient had BM this morning. BM was noted to be black in color, Dr. Wynetta Emery made aware, occult blood ordered. Patient and wife made aware, will continue to monitor.

## 2021-03-28 NOTE — TOC Progression Note (Signed)
Transition of Care [facility]) - Progression Note    Patient Details  Name: [name] MRN: [id number] Date of Birth: [birth year]  Transition of Care [facility]) CM/SW Contact  Natasha Bence, LCSW Phone Number: 03/28/2021, 10:52 AM  Clinical Narrative:    Patient agreeable to [facility]. [facility] added to [place]. Auth approved [id number]. Mardene Celeste with [facility] reported that they will be able to take patient Monday. TOC to follow.    Expected Discharge Plan: Skilled Nursing Facility Barriers to Discharge: Continued Medical Work up  Expected Discharge Plan and Services Expected Discharge Plan: [facility]                                               Social Determinants of Health (SDOH) Interventions    Readmission Risk Interventions Readmission Risk Prevention Plan 07/17/2020  Transportation Screening Complete  PCP or Specialist Appt within 5-7 Days Not Complete  Not Complete comments Patient discharging to SNF  Home Care Screening Complete  Medication Review (RN CM) Complete  Some recent data might be hidden

## 2021-03-29 DIAGNOSIS — R7881 Bacteremia: Secondary | ICD-10-CM | POA: Diagnosis not present

## 2021-03-29 DIAGNOSIS — I214 Non-ST elevation (NSTEMI) myocardial infarction: Secondary | ICD-10-CM | POA: Diagnosis not present

## 2021-03-29 DIAGNOSIS — C3490 Malignant neoplasm of unspecified part of unspecified bronchus or lung: Secondary | ICD-10-CM | POA: Diagnosis not present

## 2021-03-29 DIAGNOSIS — N1831 Chronic kidney disease, stage 3a: Secondary | ICD-10-CM | POA: Diagnosis not present

## 2021-03-29 LAB — BASIC METABOLIC PANEL
Anion gap: 8 (ref 5–15)
BUN: 7 mg/dL — ABNORMAL LOW (ref 8–23)
CO2: 28 mmol/L (ref 22–32)
Calcium: 7.2 mg/dL — ABNORMAL LOW (ref 8.9–10.3)
Chloride: 97 mmol/L — ABNORMAL LOW (ref 98–111)
Creatinine, Ser: 1.13 mg/dL (ref 0.61–1.24)
GFR, Estimated: >60 mL/min (ref >60)
Glucose, Bld: 151 mg/dL — ABNORMAL HIGH (ref 70–99)
Potassium: 3.6 mmol/L (ref 3.5–5.1)
Sodium: 133 mmol/L — ABNORMAL LOW (ref 135–145)

## 2021-03-29 LAB — CBC
HCT: 27.8 % — ABNORMAL LOW (ref 39.0–52.0)
Hemoglobin: 9.2 g/dL — ABNORMAL LOW (ref 13.0–17.0)
MCH: 25.8 pg — ABNORMAL LOW (ref 26.0–34.0)
MCHC: 33.1 g/dL (ref 30.0–36.0)
MCV: 77.9 fL — ABNORMAL LOW (ref 80.0–100.0)
Platelets: 205 10*3/uL (ref 150–400)
RBC: 3.57 MIL/uL — ABNORMAL LOW (ref 4.22–5.81)
RDW: 16.9 % — ABNORMAL HIGH (ref 11.5–15.5)
WBC: 5.9 10*3/uL (ref 4.0–10.5)
nRBC: 0 % (ref 0.0–0.2)

## 2021-03-29 LAB — GLUCOSE, CAPILLARY
Glucose-Capillary: 162 mg/dL — ABNORMAL HIGH (ref 70–99)
Glucose-Capillary: 182 mg/dL — ABNORMAL HIGH (ref 70–99)
Glucose-Capillary: 180 mg/dL — ABNORMAL HIGH (ref 70–99)
Glucose-Capillary: 225 mg/dL — ABNORMAL HIGH (ref 70–99)

## 2021-03-29 NOTE — Progress Notes (Signed)
PROGRESS NOTE   [name]  WUJ:[id number] DOB: [birth year] DOA: 03/20/2021 PCP: [name], MD   Chief Complaint  Patient presents with   Fever   Level of care: Telemetry  Brief Admission History:  80 y.o. male with hx of metastatic lung cancer currently on palliative immunotherapy, CAD status post four-vessel CABG, CKD, type 2 diabetes, who presents with chest pain.  Patient reports that he has felt unwell for several days.  His chest has been hurting but since just prior to presenting to the ED he has felt like an elephant is sitting on his chest.  He also feels very short of breath currently.  Also notes having a significant cough as well as being very weak.  Has not had much to eat or drink in the past couple of days either.  He was admitted with STEMI that is being managed medically.    Assessment & Plan:   Active Problems:   Type 2 diabetes mellitus without complication, without long-term current use of insulin (HCC)   CKD (chronic kidney disease) stage 3, GFR 30-59 ml/min (HCC)   S/P CABG x 4   Adenocarcinoma of lung, stage 4 (HCC)   STEMI (ST elevation myocardial infarction) ([place])   Hyponatremia   Sepsis ([place])   Sepsis due to Enterococcus ([place])   Bacteremia due to Enterococcus   Atrial fibrillation with rapid ventricular response ([place])   NSTEMI (non-ST elevated myocardial infarction) ([place])   STEMI - Pt is being medically managed.   - He completed a course of IV heparin and nitroglycerin - continue statin therapy.  - continue plavix; DC ASA when starting apixaban - Echo 01/09/21: EF 55-60%, no WMA, normal RV, mild MR - ranexa BID - cardiology team following and plan is to manage medically.  - Pt to go to SNF on Monday 11/14   Severe Sepsis - RESOLVED -Sepsis physiology is resolved now -Secondary to E faecalis bacteremia and pneumonia -Lactic acidosis has resolved -Continue IV antibiotics thru 11/12.   E faecalis bacteremia - TREATED -Repeated blood cultures  have been no growth to date -Planning for TEE when okay with cardiology team likely 03/27/21  -Continue IV ampicillin as ordered - duration of therapy 5-7 days if TEE negative -TEE completed 11/11 and no evidence of vegetations seen.  - ampicillin completed 7/7 ceftriaxone completed, discontinued antibiotics 11/12  Lobar pneumonia - TREATED  - treated with cefepime, completed ampicillin, ceftriaxone on 11/12 - MRSA negative   Acute respiratory failure with hypoxia -Wean to room air if possible  Paroxysmal atrial fibrillation -Okay with cardiology to transition to apixaban  -Consult to Pharm.D. for apixaban, DC aspirin, continue Plavix per cardiology -Bleeding precautions -CHA2DS2-VASc score equals 6 -Continue metoprolol for rate control  Stage IIIb CKD -Stable  Acute urinary retention -DC foley and do voiding trial 11/10  - Pt Failed voiding trial  - Foley had to be re-inserted 11/11 - continue flomax as ordered - DC with foley in place and do outpatient voiding trial and urology follow up in 1-2 weeks post discharge  Hematuria - suspect related to foley placement, following  - CBC Hg has been stable.  Appears to be cleared up more today.   Metastatic lung adenocarcinoma -He is followed closely by Dr. Delton Coombes and currently holding immunotherapy in setting of acute infection -Outpatient follow-up with Dr. Delton Coombes on 12/8.   Type 2 diabetes mellitus with vascular complications -Uncontrolled as evidenced by hemoglobin A1c of 7.3% -Holding home Amaryl and metformin and Actos -  Currently being managed with low-dose glargine CBG (last 3)  Recent Labs    03/28/21 1655 03/28/21 2109 03/29/21 0751  GLUCAP 110* 166* 162*  ' Essential hypertension -Controlled on current medications continue to follow  Hyponatremia - stable   Hypophosphatemia -This is been repleted  DNR present on admission -Patient wishes to continue DNR orders inpatient -Appreciate palliative  medicine team for goals of care discussion  DVT prophylaxis: Apixaban Code Status: Full Family Communication: wife called 11/12 to update  Disposition: SNF when bed available  Status is: Inpatient  Remains inpatient appropriate because: He remains on IV antibiotics for bacteremia     Consultants:  Cardiology Palliative medicine  Procedures:  Tentatively TEE for 11/11  Antimicrobials:   See above   Subjective: Pt unfortunately had to have foley replaced due to urinary retention.  No CP.  No SOB.      Objective: Vitals:   03/29/21 0500 03/29/21 0506 03/29/21 0910 03/29/21 0942  BP:  131/70 127/65   Pulse:  90 92   Resp:  18    Temp:  98.6 F (37 C)    TempSrc:      SpO2:  94% 95% 94%  Weight: 88.2 kg     Height:        Intake/Output Summary (Last 24 hours) at 03/29/2021 1030 Last data filed at 03/29/2021 0600 Gross per 24 hour  Intake 780 ml  Output 1800 ml  Net -1020 ml   Filed Weights   03/27/21 0500 03/28/21 0500 03/29/21 0500  Weight: 89.6 kg 89.8 kg 88.2 kg    Examination:  General exam: Chronically ill-appearing male lying in the bed he is awake and alert in no apparent distress appears calm and comfortable.  Respiratory system: good air movment bilaterally. Respiratory effort normal. Cardiovascular system: normal S1 & S2 heard. No JVD, murmurs, rubs, gallops or clicks. No pedal edema. Gastrointestinal system: Abdomen is nondistended, soft and nontender. No organomegaly or masses felt. Normal bowel sounds heard. Central nervous system: Alert and oriented. No focal neurological deficits. Extremities: Symmetric 5 x 5 power. Skin: No rashes, lesions or ulcers Psychiatry: Judgement and insight appear poor. Mood & affect appropriate.   Data Reviewed: I have personally reviewed following labs and imaging studies  CBC: Recent Labs  Lab 03/25/21 0413 03/26/21 0541 03/27/21 0529 03/28/21 0442 03/29/21 0358  WBC 6.6 6.2 5.2 5.6 5.9  HGB 8.1* 8.5* 9.3*  9.1* 9.2*  HCT 24.1* 26.2* 28.8* 28.0* 27.8*  MCV 74.6* 78.2* 76.8* 78.4* 77.9*  PLT 209 217 218 191 397    Basic Metabolic Panel: Recent Labs  Lab 03/23/21 0458 03/24/21 0421 03/25/21 0413 03/26/21 0541 03/28/21 0442 03/29/21 0358  NA 127* 130* 133* 133* 133* 133*  K 3.8 3.8 3.3* 3.7 3.0* 3.6  CL 103 105 103 102 100 97*  CO2 19* 21* 22 25 27 28   GLUCOSE 200* 164* 111* 140* 177* 151*  BUN 11 9 8  7* 6* 7*  CREATININE 1.20 1.22 1.22 1.30* 1.23 1.13  CALCIUM 6.9* 6.9* 6.9* 7.1* 7.2* 7.2*  MG 1.8 1.7 2.0 1.9 1.7  --   PHOS 2.1* 3.1 4.4  --   --   --     GFR: Estimated Creatinine Clearance: 54.6 mL/min (by C-G formula based on SCr of 1.13 mg/dL).  Liver Function Tests: Recent Labs  Lab 03/23/21 0458  AST 21  ALT 11  ALKPHOS 74  BILITOT 0.6  PROT 5.7*  ALBUMIN 1.6*    CBG: Recent Labs  Lab 03/28/21 0800 03/28/21 1207 03/28/21 1655 03/28/21 2109 03/29/21 0751  GLUCAP 154* 162* 110* 166* 162*    Recent Results (from the past 240 hour(s))  Blood Culture (routine x 2)     Status: None   Collection Time: 03/20/21  5:57 PM   Specimen: BLOOD LEFT ARM  Result Value Ref Range Status   Specimen Description BLOOD LEFT ARM  Final   Special Requests   Final    BOTTLES DRAWN AEROBIC ONLY Blood Culture adequate volume   Culture   Final    NO GROWTH 5 DAYS Performed at [facility], [address] [place], [place] [address]    Report Status 03/25/2021 FINAL  Final  Blood Culture (routine x 2)     Status: Abnormal   Collection Time: 03/20/21  5:57 PM   Specimen: Right Antecubital; Blood  Result Value Ref Range Status   Specimen Description   Final    RIGHT ANTECUBITAL Performed at [facility], [address], [place], [place] [address]    Special Requests   Final    BOTTLES DRAWN AEROBIC AND ANAEROBIC Blood Culture results may not be optimal due to an inadequate volume of blood received in culture bottles Performed at [facility], [address] [place],  [place] [address]    Culture  Setup Time   Final    IN BOTH AEROBIC AND ANAEROBIC BOTTLES GRAM POSITIVE COCCI CRITICAL RESULT CALLED TO, READ BACK BY AND VERIFIED WITH: MILLS,M 1629 03/21/2021 COLEMAN,R CRITICAL RESULT CALLED TO, READ BACK BY AND VERIFIED WITH: RN T.ALLEN ON [id number] AT 2023 BY E.PARRISH Performed at [facility], [place] [address] [place], [place] [address]    Culture ENTEROCOCCUS FAECALIS (A)  Final   Report Status 03/23/2021 FINAL  Final   Organism ID, Bacteria ENTEROCOCCUS FAECALIS  Final      Susceptibility   Enterococcus faecalis - MIC*    AMPICILLIN <=2 SENSITIVE Sensitive     VANCOMYCIN <=0.5 SENSITIVE Sensitive     GENTAMICIN SYNERGY SENSITIVE Sensitive     * ENTEROCOCCUS FAECALIS  Blood Culture ID Panel (Reflexed)     Status: Abnormal   Collection Time: 03/20/21  5:57 PM  Result Value Ref Range Status   Enterococcus faecalis DETECTED (A) NOT DETECTED Final    Comment: CRITICAL RESULT CALLED TO, READ BACK BY AND VERIFIED WITH: RN T.ALLEN ON [id number] AT 2023 BY E.PARRISH    Enterococcus Faecium NOT DETECTED NOT DETECTED Final   Listeria monocytogenes NOT DETECTED NOT DETECTED Final   Staphylococcus species NOT DETECTED NOT DETECTED Final   Staphylococcus aureus (BCID) NOT DETECTED NOT DETECTED Final   Staphylococcus epidermidis NOT DETECTED NOT DETECTED Final   Staphylococcus lugdunensis NOT DETECTED NOT DETECTED Final   Streptococcus species NOT DETECTED NOT DETECTED Final   Streptococcus agalactiae NOT DETECTED NOT DETECTED Final   Streptococcus pneumoniae NOT DETECTED NOT DETECTED Final   Streptococcus pyogenes NOT DETECTED NOT DETECTED Final   A.calcoaceticus-baumannii NOT DETECTED NOT DETECTED Final   Bacteroides fragilis NOT DETECTED NOT DETECTED Final   Enterobacterales NOT DETECTED NOT DETECTED Final   Enterobacter cloacae complex NOT DETECTED NOT DETECTED Final   Escherichia coli NOT DETECTED NOT DETECTED Final   Klebsiella aerogenes NOT DETECTED  NOT DETECTED Final   Klebsiella oxytoca NOT DETECTED NOT DETECTED Final   Klebsiella pneumoniae NOT DETECTED NOT DETECTED Final   Proteus species NOT DETECTED NOT DETECTED Final   Salmonella species NOT DETECTED NOT DETECTED Final   Serratia marcescens NOT  DETECTED NOT DETECTED Final   Haemophilus influenzae NOT DETECTED NOT DETECTED Final   Neisseria meningitidis NOT DETECTED NOT DETECTED Final   Pseudomonas aeruginosa NOT DETECTED NOT DETECTED Final   Stenotrophomonas maltophilia NOT DETECTED NOT DETECTED Final   Candida albicans NOT DETECTED NOT DETECTED Final   Candida auris NOT DETECTED NOT DETECTED Final   Candida glabrata NOT DETECTED NOT DETECTED Final   Candida krusei NOT DETECTED NOT DETECTED Final   Candida parapsilosis NOT DETECTED NOT DETECTED Final   Candida tropicalis NOT DETECTED NOT DETECTED Final   Cryptococcus neoformans/gattii NOT DETECTED NOT DETECTED Final   Vancomycin resistance NOT DETECTED NOT DETECTED Final    Comment: Performed at [facility], [place] [address] [place], [place] [address]  Resp Panel by RT-PCR (Flu A&B, Covid) Nasopharyngeal Swab     Status: None   Collection Time: 03/20/21  6:25 PM   Specimen: Nasopharyngeal Swab; Nasopharyngeal(NP) swabs in vial transport medium  Result Value Ref Range Status   SARS Coronavirus 2 by RT PCR NEGATIVE NEGATIVE Final    Comment: (NOTE) SARS-CoV-2 target nucleic acids are NOT DETECTED.  The SARS-CoV-2 RNA is generally detectable in upper respiratory specimens during the acute phase of infection. The lowest concentration of SARS-CoV-2 viral copies this assay can detect is 138 copies/mL. A negative result does not preclude SARS-Cov-2 infection and should not be used as the sole basis for treatment or other patient management decisions. A negative result may occur with  improper specimen collection/handling, submission of specimen other than nasopharyngeal swab, presence of viral mutation(s) within  the areas targeted by this assay, and inadequate number of viral copies(<138 copies/mL). A negative result must be combined with clinical observations, patient history, and epidemiological information. The expected result is Negative.  Fact Sheet for Patients:  EntrepreneurPulse.com.au  Fact Sheet for Healthcare Providers:  IncredibleEmployment.be  This test is no t yet approved or cleared by the [place] FDA and  has been authorized for detection and/or diagnosis of SARS-CoV-2 by FDA under an Emergency Use Authorization (EUA). This EUA will remain  in effect (meaning this test can be used) for the duration of the COVID-19 declaration under Section 564(b)(1) of the Act, 21 U.S.C.section 360bbb-3(b)(1), unless the authorization is terminated  or revoked sooner.       Influenza A by PCR NEGATIVE NEGATIVE Final   Influenza B by PCR NEGATIVE NEGATIVE Final    Comment: (NOTE) The Xpert Xpress SARS-CoV-2/FLU/RSV plus assay is intended as an aid in the diagnosis of influenza from Nasopharyngeal swab specimens and should not be used as a sole basis for treatment. Nasal washings and aspirates are unacceptable for Xpert Xpress SARS-CoV-2/FLU/RSV testing.  Fact Sheet for Patients: EntrepreneurPulse.com.au  Fact Sheet for Healthcare Providers: IncredibleEmployment.be  This test is not yet approved or cleared by the [place] FDA and has been authorized for detection and/or diagnosis of SARS-CoV-2 by FDA under an Emergency Use Authorization (EUA). This EUA will remain in effect (meaning this test can be used) for the duration of the COVID-19 declaration under Section 564(b)(1) of the Act, 21 U.S.C. section 360bbb-3(b)(1), unless the authorization is terminated or revoked.  Performed at [facility], [address], [place], [place] [address]   MRSA Next Gen by PCR, Nasal     Status: None   Collection  Time: 03/20/21 11:37 PM   Specimen: Nasal Mucosa; Nasal Swab  Result Value Ref Range Status   MRSA by PCR Next Gen NOT DETECTED NOT DETECTED Final  Comment: (NOTE) The GeneXpert MRSA Assay (FDA approved for NASAL specimens only), is one component of a comprehensive MRSA colonization surveillance program. It is not intended to diagnose MRSA infection nor to guide or monitor treatment for MRSA infections. Test performance is not FDA approved in patients less than 37 years old. Performed at [facility], [address] [place], [place] [address]   Urine Culture     Status: Abnormal   Collection Time: 03/22/21  8:27 AM   Specimen: Urine, Clean Catch  Result Value Ref Range Status   Specimen Description   Final    URINE, CLEAN CATCH Performed at [facility], [address] [place], [place] [address]    Special Requests   Final    NONE Performed at [facility], [address] [place] [address], [place], [place] [address]    Culture MULTIPLE SPECIES PRESENT, SUGGEST RECOLLECTION (A)  Final   Report Status 03/23/2021 FINAL  Final  Culture, blood (Routine X 2) w Reflex to ID Panel     Status: None   Collection Time: 03/22/21 12:07 PM   Specimen: BLOOD RIGHT ARM  Result Value Ref Range Status   Specimen Description BLOOD RIGHT ARM BOTTLES DRAWN AEROBIC ONLY  Final   Special Requests Blood Culture adequate volume  Final   Culture   Final    NO GROWTH 5 DAYS Performed at [facility], [address] [place], [place] [address]    Report Status 03/27/2021 FINAL  Final  Culture, blood (Routine X 2) w Reflex to ID Panel     Status: None   Collection Time: 03/22/21 12:22 PM   Specimen: BLOOD RIGHT HAND  Result Value Ref Range Status   Specimen Description BLOOD RIGHT HAND BOTTLES DRAWN AEROBIC ONLY  Final   Special Requests Blood Culture adequate volume  Final   Culture   Final    NO GROWTH 5 DAYS Performed at [facility], [address], [place], [place] [address]    Report Status 03/27/2021 FINAL   Final  Urine Culture     Status: None   Collection Time: 03/24/21  5:00 AM   Specimen: Urine, Clean Catch  Result Value Ref Range Status   Specimen Description   Final    URINE, CLEAN CATCH Performed at [facility], [address], [place], [place] [address]    Special Requests   Final    NONE Performed at [facility], [address] [place], [place] [address]    Culture   Final    NO GROWTH Performed at [facility], [place] [address] [place], [place] [address]    Report Status 03/26/2021 FINAL  Final     Radiology Studies: ECHO TEE  Result Date: 03/27/2021    TRANSESOPHOGEAL ECHO REPORT   Patient Name:   [name] Date of Exam: 03/27/2021 Medical Rec #:  [id number]     Height:       71.0 in Accession #:    [id number]    Weight:       197.5 lb Date of Birth:  August 12, 1940     BSA:          2.097 m Patient Age:    43 years      BP:           115/71 mmHg Patient Gender: M             HR:           79 bpm. Exam [facility] Procedure: Transesophageal Echo, Cardiac Doppler and Color Doppler Indications:     Bacteremia  History:         Patient has prior history of Echocardiogram examinations, most                  recent 03/21/2021. CAD and Previous Myocardial Infarction, Prior                  CABG, Arrythmias:Atrial Fibrillation,                  Signs/Symptoms:Bacteremia; Risk Factors:Diabetes and                  Hypertension. Lung cancer, Pneumonia.  Sonographer:     Dustin Flock RDCS Referring Phys:  [id number] Erma Heritage Diagnosing Phys: Carlyle Dolly MD PROCEDURE: The transesophogeal probe was passed without difficulty through the esophogus of the patient. Sedation performed by different physician. The patient was monitored while under deep sedation. Anesthestetic sedation was provided intravenously by Anesthesiology: 79mg  of Propofol. The patient developed no complications during the procedure. IMPRESSIONS  1. Left ventricular ejection fraction, by estimation,  is 35 to 40%. The left ventricle has moderately decreased function.  2. Right ventricular systolic function is normal. The right ventricular size is mildly enlarged.  3. Left atrial size was severely dilated. No left atrial/left atrial appendage thrombus was detected. The LAA emptying velocity was 56 cm/s.  4. Right atrial size was severely dilated.  5. The mitral valve is abnormal. Moderate mitral valve regurgitation. No evidence of mitral stenosis.  6. The tricuspid valve is abnormal. Tricuspid valve regurgitation is moderate.  7. The aortic valve is tricuspid. Aortic valve regurgitation is not visualized. No aortic stenosis is present.  8. There is mild (Grade II) plaque. Conclusion(s)/Recommendation(s): No evidence of vegetation/infective endocarditis on this transesophageael echocardiogram. FINDINGS  Left Ventricle: Left ventricular ejection fraction, by estimation, is 35 to 40%. The left ventricle has moderately decreased function. The left ventricular internal cavity size was normal in size. Right Ventricle: The right ventricular size is mildly enlarged. No increase in right ventricular wall thickness. Right ventricular systolic function is normal. Left Atrium: Left atrial size was severely dilated. No left atrial/left atrial appendage thrombus was detected. The LAA emptying velocity was 56 cm/s. Right Atrium: Right atrial size was severely dilated. Pericardium: There is no evidence of pericardial effusion. Mitral Valve: The MR vena contracta is 0.4 cm. The mitral valve is abnormal. Moderate mitral valve regurgitation. No evidence of mitral valve stenosis. Tricuspid Valve: The tricuspid valve is abnormal. Tricuspid valve regurgitation is moderate . No evidence of tricuspid stenosis. Aortic Valve: The aortic valve is tricuspid. Aortic valve regurgitation is not visualized. No aortic stenosis is present. Aortic valve mean gradient measures 2.0 mmHg. Aortic valve peak gradient measures 4.7 mmHg. Pulmonic Valve:  The pulmonic valve was normal in structure. Pulmonic valve regurgitation is mild. No evidence of pulmonic stenosis. Aorta: The aortic root is normal in size and structure. There is mild (Grade II) plaque. IAS/Shunts: No atrial level shunt detected by color flow Doppler.  AORTIC VALVE AV Vmax:           108.00 cm/s AV Vmean:          72.700 cm/s AV VTI:            0.218 m AV Peak Grad:      4.7 mmHg AV Mean Grad:      2.0 mmHg LVOT Vmax:  78.00 cm/s LVOT Vmean:        50.300 cm/s LVOT VTI:          0.139 m LVOT/AV VTI ratio: 0.64  AORTA Ao Root diam: 3.60 cm  SHUNTS Systemic VTI: 0.14 m Carlyle Dolly MD Electronically signed by Carlyle Dolly MD Signature Date/Time: 03/27/2021/11:50:29 AM    Final     Scheduled Meds:  apixaban  5 mg Oral BID   atorvastatin  80 mg Oral Daily   budesonide (PULMICORT) nebulizer solution  0.5 mg Nebulization BID   Chlorhexidine Gluconate Cloth  6 each Topical Daily   clopidogrel  75 mg Oral Daily   dextromethorphan-guaiFENesin  1 tablet Oral BID   escitalopram  10 mg Oral Daily   insulin aspart  0-5 Units Subcutaneous QHS   insulin aspart  0-9 Units Subcutaneous TID WC   insulin glargine-yfgn  5 Units Subcutaneous Daily   ipratropium  0.5 mg Nebulization TID   levalbuterol  0.63 mg Nebulization TID   metoprolol succinate  25 mg Oral Daily   phosphorus  500 mg Oral BID   ranolazine  500 mg Oral BID   sacubitril-valsartan  1 tablet Oral BID   spironolactone  12.5 mg Oral Daily   tamsulosin  0.4 mg Oral QPC supper   Continuous Infusions:   LOS: 9 days   Time spent 35 mins   Zeba Luby Wynetta Emery, MD How to contact the [facility] Attending or Consulting provider [place] or covering provider during after hours [place], for this patient?  Check the care team in [facility] and look for a) attending/consulting [facility] provider listed and b) the [facility] team listed Log into [email or web address] and use [place]'s universal password to access. If you do not have the password, please  contact the hospital operator. Locate the [facility] provider you are looking for under [facility] and page to a number that you can be directly reached. If you still have difficulty reaching the provider, please page the [facility] (Director on Call) for the Hospitalists listed on amion for assistance.  03/29/2021, 10:30 AM

## 2021-03-29 NOTE — Progress Notes (Signed)
Patient alert and oriented x4. No acute distress noted. No complaints voiced. All needs attended to. Will continue to monitor.

## 2021-03-30 ENCOUNTER — Encounter (HOSPITAL_COMMUNITY): Payer: Self-pay | Admitting: Cardiology

## 2021-03-30 DIAGNOSIS — R58 Hemorrhage, not elsewhere classified: Secondary | ICD-10-CM | POA: Diagnosis not present

## 2021-03-30 DIAGNOSIS — R2689 Other abnormalities of gait and mobility: Secondary | ICD-10-CM | POA: Diagnosis not present

## 2021-03-30 DIAGNOSIS — B952 Enterococcus as the cause of diseases classified elsewhere: Secondary | ICD-10-CM | POA: Diagnosis not present

## 2021-03-30 DIAGNOSIS — Z951 Presence of aortocoronary bypass graft: Secondary | ICD-10-CM | POA: Diagnosis not present

## 2021-03-30 DIAGNOSIS — M899 Disorder of bone, unspecified: Secondary | ICD-10-CM | POA: Diagnosis not present

## 2021-03-30 DIAGNOSIS — Z960 Presence of urogenital implants: Secondary | ICD-10-CM | POA: Diagnosis not present

## 2021-03-30 DIAGNOSIS — N1831 Chronic kidney disease, stage 3a: Secondary | ICD-10-CM | POA: Diagnosis not present

## 2021-03-30 DIAGNOSIS — J90 Pleural effusion, not elsewhere classified: Secondary | ICD-10-CM | POA: Diagnosis not present

## 2021-03-30 DIAGNOSIS — E119 Type 2 diabetes mellitus without complications: Secondary | ICD-10-CM | POA: Diagnosis not present

## 2021-03-30 DIAGNOSIS — I214 Non-ST elevation (NSTEMI) myocardial infarction: Secondary | ICD-10-CM | POA: Diagnosis not present

## 2021-03-30 DIAGNOSIS — I700 Atherosclerosis of aorta: Secondary | ICD-10-CM | POA: Diagnosis not present

## 2021-03-30 DIAGNOSIS — R5381 Other malaise: Secondary | ICD-10-CM | POA: Diagnosis not present

## 2021-03-30 DIAGNOSIS — M6281 Muscle weakness (generalized): Secondary | ICD-10-CM | POA: Diagnosis not present

## 2021-03-30 DIAGNOSIS — R9431 Abnormal electrocardiogram [ECG] [EKG]: Secondary | ICD-10-CM | POA: Diagnosis not present

## 2021-03-30 DIAGNOSIS — R042 Hemoptysis: Secondary | ICD-10-CM | POA: Diagnosis not present

## 2021-03-30 DIAGNOSIS — E871 Hypo-osmolality and hyponatremia: Secondary | ICD-10-CM | POA: Diagnosis not present

## 2021-03-30 DIAGNOSIS — I4891 Unspecified atrial fibrillation: Secondary | ICD-10-CM | POA: Diagnosis not present

## 2021-03-30 DIAGNOSIS — Z7902 Long term (current) use of antithrombotics/antiplatelets: Secondary | ICD-10-CM | POA: Diagnosis not present

## 2021-03-30 DIAGNOSIS — N139 Obstructive and reflux uropathy, unspecified: Secondary | ICD-10-CM | POA: Diagnosis not present

## 2021-03-30 DIAGNOSIS — R7881 Bacteremia: Secondary | ICD-10-CM | POA: Diagnosis not present

## 2021-03-30 DIAGNOSIS — R918 Other nonspecific abnormal finding of lung field: Secondary | ICD-10-CM | POA: Diagnosis not present

## 2021-03-30 DIAGNOSIS — J9601 Acute respiratory failure with hypoxia: Secondary | ICD-10-CM | POA: Diagnosis not present

## 2021-03-30 DIAGNOSIS — I5022 Chronic systolic (congestive) heart failure: Secondary | ICD-10-CM | POA: Diagnosis not present

## 2021-03-30 DIAGNOSIS — Z7901 Long term (current) use of anticoagulants: Secondary | ICD-10-CM | POA: Diagnosis not present

## 2021-03-30 DIAGNOSIS — R339 Retention of urine, unspecified: Secondary | ICD-10-CM | POA: Diagnosis not present

## 2021-03-30 DIAGNOSIS — R059 Cough, unspecified: Secondary | ICD-10-CM | POA: Diagnosis not present

## 2021-03-30 DIAGNOSIS — Z7401 Bed confinement status: Secondary | ICD-10-CM | POA: Diagnosis not present

## 2021-03-30 DIAGNOSIS — E1159 Type 2 diabetes mellitus with other circulatory complications: Secondary | ICD-10-CM | POA: Diagnosis not present

## 2021-03-30 DIAGNOSIS — R279 Unspecified lack of coordination: Secondary | ICD-10-CM | POA: Diagnosis not present

## 2021-03-30 DIAGNOSIS — I10 Essential (primary) hypertension: Secondary | ICD-10-CM | POA: Diagnosis not present

## 2021-03-30 DIAGNOSIS — N183 Chronic kidney disease, stage 3 unspecified: Secondary | ICD-10-CM | POA: Diagnosis not present

## 2021-03-30 DIAGNOSIS — A4181 Sepsis due to Enterococcus: Secondary | ICD-10-CM | POA: Diagnosis not present

## 2021-03-30 DIAGNOSIS — I213 ST elevation (STEMI) myocardial infarction of unspecified site: Secondary | ICD-10-CM | POA: Diagnosis not present

## 2021-03-30 DIAGNOSIS — J9811 Atelectasis: Secondary | ICD-10-CM | POA: Diagnosis not present

## 2021-03-30 DIAGNOSIS — J439 Emphysema, unspecified: Secondary | ICD-10-CM | POA: Diagnosis not present

## 2021-03-30 DIAGNOSIS — Z743 Need for continuous supervision: Secondary | ICD-10-CM | POA: Diagnosis not present

## 2021-03-30 DIAGNOSIS — I2510 Atherosclerotic heart disease of native coronary artery without angina pectoris: Secondary | ICD-10-CM | POA: Diagnosis not present

## 2021-03-30 DIAGNOSIS — R079 Chest pain, unspecified: Secondary | ICD-10-CM | POA: Diagnosis not present

## 2021-03-30 DIAGNOSIS — C3490 Malignant neoplasm of unspecified part of unspecified bronchus or lung: Secondary | ICD-10-CM | POA: Diagnosis not present

## 2021-03-30 DIAGNOSIS — D0220 Carcinoma in situ of unspecified bronchus and lung: Secondary | ICD-10-CM | POA: Diagnosis not present

## 2021-03-30 LAB — RESP PANEL BY RT-PCR (FLU A&B, COVID) ARPGX2
Influenza A by PCR: NEGATIVE
Influenza B by PCR: NEGATIVE
SARS Coronavirus 2 by RT PCR: NEGATIVE

## 2021-03-30 LAB — GLUCOSE, CAPILLARY
Glucose-Capillary: 194 mg/dL — ABNORMAL HIGH (ref 70–99)
Glucose-Capillary: 228 mg/dL — ABNORMAL HIGH (ref 70–99)

## 2021-03-30 MED ORDER — GLIMEPIRIDE 1 MG PO TABS: 1.0000 mg | ORAL_TABLET | Freq: Every day | ORAL | Status: AC

## 2021-03-30 MED ORDER — IPRATROPIUM BROMIDE 0.02 % IN SOLN: 0.5000 mg | Freq: Two times a day (BID) | RESPIRATORY_TRACT | 12 refills | Status: DC

## 2021-03-30 MED ORDER — SACUBITRIL-VALSARTAN 24-26 MG PO TABS: 1.0000 | ORAL_TABLET | Freq: Two times a day (BID) | ORAL | Status: DC

## 2021-03-30 MED ORDER — ONDANSETRON HCL 4 MG PO TABS: 4.0000 mg | ORAL_TABLET | Freq: Four times a day (QID) | ORAL | 0 refills | Status: AC | PRN

## 2021-03-30 MED ORDER — CLOPIDOGREL BISULFATE 75 MG PO TABS: 75.0000 mg | ORAL_TABLET | Freq: Every day | ORAL | Status: DC

## 2021-03-30 MED ORDER — ONDANSETRON HCL 4 MG/2ML IJ SOLN: 4.0000 mg | Freq: Four times a day (QID) | INTRAMUSCULAR | 0 refills | Status: DC | PRN

## 2021-03-30 MED ORDER — ATORVASTATIN CALCIUM 80 MG PO TABS
80.0000 mg | ORAL_TABLET | Freq: Every day | ORAL | Status: AC
Start: 2021-03-31 — End: ?

## 2021-03-30 MED ORDER — METOPROLOL SUCCINATE ER 25 MG PO TB24: 25.0000 mg | ORAL_TABLET | Freq: Every day | ORAL | Status: AC

## 2021-03-30 MED ORDER — LEVALBUTEROL HCL 0.63 MG/3ML IN NEBU: 0.6300 mg | INHALATION_SOLUTION | Freq: Two times a day (BID) | RESPIRATORY_TRACT | 12 refills | Status: DC

## 2021-03-30 MED ORDER — IPRATROPIUM BROMIDE 0.02 % IN SOLN: 0.5000 mg | Freq: Two times a day (BID) | RESPIRATORY_TRACT | Status: DC

## 2021-03-30 MED ORDER — LEVALBUTEROL HCL 0.63 MG/3ML IN NEBU: 0.6300 mg | INHALATION_SOLUTION | Freq: Two times a day (BID) | RESPIRATORY_TRACT | Status: DC

## 2021-03-30 MED ORDER — METFORMIN HCL ER 500 MG PO TB24: 500.0000 mg | ORAL_TABLET | Freq: Two times a day (BID) | ORAL | Status: AC

## 2021-03-30 MED ORDER — RANOLAZINE ER 500 MG PO TB12: 500.0000 mg | ORAL_TABLET | Freq: Two times a day (BID) | ORAL | Status: DC

## 2021-03-30 MED ORDER — APIXABAN 5 MG PO TABS: 5.0000 mg | ORAL_TABLET | Freq: Two times a day (BID) | ORAL | Status: DC

## 2021-03-30 MED ORDER — SPIRONOLACTONE 25 MG PO TABS: 12.5000 mg | ORAL_TABLET | Freq: Every day | ORAL | Status: DC

## 2021-03-30 NOTE — Discharge Summary (Signed)
Physician Discharge Summary  LATRAVIOUS LEVITT NFA:[id number] DOB: May 01, 1941 DOA: 03/20/2021  PCP: [name], MD Urologist: [facility]  Cardiology: Gasper Sells Oncology: Delton Coombes   Admit date: 03/20/2021 Discharge date: 03/30/2021  Disposition:  [place]   Recommendations for Outpatient Follow-up:  Follow up with PCP in 2 weeks Follow up with cardiology in 2-3 weeks Follow up with oncologist on 04/23/21 as scheduled Follow up with [facility] in 1-2 weeks for foley removal, voiding trial, hematuria evaluation Please obtain BMP/CBC in 1 week to follow up electrolytes and follow up hemoglobin  Recommend outpatient palliative care consultation   Discharge Condition: STABLE   CODE STATUS: DNR  DIET: heart healthy / carb modified    Brief Hospitalization Summary: Please see all hospital notes, images, labs for full details of the hospitalization. ADMISSION HPI:   ZAKIR HENNER is a 80 y.o. male with hx of metastatic lung cancer currently on palliative immunotherapy, CAD status post four-vessel CABG, CKD, type 2 diabetes, who presents with chest pain.   Patient reports that he has felt unwell for several days.  His chest has been hurting but since just prior to presenting to the ED he has felt like an elephant is sitting on his chest.  He also feels very short of breath currently.  Also notes having a significant cough as well as being very weak.  Has not had much to eat or drink in the past couple of days either.   In the ED vital signs notable for hypoxia to the low 90s requiring 4 L nasal cannula, normal to hypertensive blood pressures, heart rate ranging from the 130s to 150s, and respirations ranging from the 20s to low 40s.  CMP notable for hyponatremia to 124, potassium 2.8, chloride 89, glucose 237, creatinine 1.49, calcium 8.1; CBC notable for white count of 19.5, remainder unremarkable.  Initial troponin was 3329, initial lactic acid was 2.9 and down trended  to 2.1 on recheck.  COVID and influenza swabs were negative.  Chest x-ray was concerning for possible bilateral lower lobe opacities possibly representing infection.  Initial EKG showed some ST depressions but no clear STEMI, on repeat newly evident STEMI was apparent in leads II, III, and aVF, with reciprocal changes seen in the anterior leads.  Patient was started on broad-spectrum antibiotics, heparin drip, and IV fluids.  ED provider consulted with cardiology, who agreed with STEMI diagnosis but did not recommend Cath Lab activation at this time given critical illness, however did recommend transfer to [facility] for further treatment and stabilization.   During ED evaluation patient had expressed to that provider that he wished to be DNR/DNI.  I asked patient the same thing and he again reiterated that he wishes to be DNR/DNI.  I asked him what his understanding was of his cancer diagnosis, and he stated that he has been in remission for many years.  He was aware that it was metastatic but did not understand the meaning of this, only that it was not growing.  HOSPITAL COURSE BY PROBLEM LIST   STEMI - Treated Medically  - Pt is being medically managed.   - He completed a course of IV heparin and nitroglycerin - continue statin therapy.  - continue plavix; DC ASA when starting apixaban - Echo 01/09/21: EF 55-60%, no WMA, normal RV, mild MR - continue ranexa BID - cardiology team following and plan is to manage medically.  - Pt to go to SNF on Monday 11/14    Severe Sepsis -  RESOLVED -Sepsis physiology is resolved now -Secondary to E faecalis bacteremia and pneumonia -Lactic acidosis has resolved -Completed IV antibiotics  11/12.    E faecalis bacteremia - TREATED -Repeated blood cultures have been no growth to date -Planning for TEE when okay with cardiology team likely 03/27/21  -Continue IV ampicillin as ordered - duration of therapy 5-7 days if TEE negative -TEE completed 11/11 and no  evidence of vegetations seen.  - ampicillin completed 7/7 ceftriaxone completed, discontinued antibiotics 11/12   Lobar pneumonia - TREATED  - treated with cefepime, completed ampicillin, ceftriaxone on 11/12 - MRSA negative    Acute respiratory failure with hypoxia -Wean to room air if possible   Paroxysmal atrial fibrillation -Okay with cardiology to transition to apixaban  -Consult to Pharm.D. for apixaban, DC aspirin, continue Plavix per cardiology -Bleeding precautions -CHA2DS2-VASc score equals 6 -Continue metoprolol for rate control   Stage IIIb CKD -Stable   Acute urinary retention -DC foley and do voiding trial 11/10  - Pt Failed voiding trial  - Foley had to be re-inserted 11/11 - continue flomax as ordered - DC with foley in place and do outpatient voiding trial and urology follow up in 1-2 weeks post discharge   Hematuria - suspect related to foley placement, following  - CBC Hg has been stable.  Appears to be cleared up more today.  - Outpatient follow up with [facility], referral placed - Please recheck CBC in 1 week to follow up hemoglobin   Metastatic lung adenocarcinoma -He is followed closely by Dr. Delton Coombes  -Outpatient follow-up with Dr. Delton Coombes on 12/8.    Type 2 diabetes mellitus with vascular complications -Uncontrolled as evidenced by hemoglobin A1c of 7.3% -RESUME home Amaryl and metformin ER.  DISCONTINUE Actos due to EF 35-40%  CBG (last 3)  Recent Labs    03/29/21 1637 03/29/21 2125 03/30/21 0709  GLUCAP 180* 225* 194*    Essential hypertension -Controlled on current medications continue to follow   Hyponatremia - stable    Hypophosphatemia -This is been repleted   DNR present on admission -Patient wishes to continue DNR orders inpatient -Appreciate palliative medicine team for goals of care discussion   DVT prophylaxis: Apixaban Code Status: Full Family Communication: wife called 11/12 to update  Disposition:  SNF when bed available  Status is: Inpatient   Remains inpatient appropriate because: He remains on IV antibiotics for bacteremia      Consultants:  Cardiology Palliative medicine  Discharge Diagnoses:  Active Problems:   Type 2 diabetes mellitus without complication, without long-term current use of insulin (HCC)   CKD (chronic kidney disease) stage 3, GFR 30-59 ml/min (HCC)   S/P CABG x 4   Adenocarcinoma of lung, stage 4 (HCC)   STEMI (ST elevation myocardial infarction) ([place])   Hyponatremia   Sepsis ([place])   Sepsis due to Enterococcus ([place])   Bacteremia due to Enterococcus   Atrial fibrillation with rapid ventricular response (HCC)   NSTEMI (non-ST elevated myocardial infarction) [facility])   Discharge Instructions: Discharge Instructions     Ambulatory referral to Cardiology   Complete by: As directed    Hospital follow up STEMI   Ambulatory referral to Urology   Complete by: As directed    Hospital follow up, hematuria, urinary retention, foley removal and voiding trial      Allergies as of 03/30/2021       Reactions   Oxycodone         Medication List  STOP taking these medications    amLODipine 10 MG tablet Commonly known as: NORVASC   aspirin EC 81 MG tablet   benzonatate 200 MG capsule Commonly known as: TESSALON   insulin lispro 100 UNIT/ML injection Commonly known as: HUMALOG   insulin lispro 100 UNIT/ML KwikPen Commonly known as: HUMALOG   isosorbide mononitrate 30 MG 24 hr tablet Commonly known as: IMDUR   metFORMIN 500 MG tablet Commonly known as: GLUCOPHAGE Replaced by: metFORMIN 500 MG 24 hr tablet   methocarbamol 500 MG tablet Commonly known as: ROBAXIN   metoprolol tartrate 100 MG tablet Commonly known as: LOPRESSOR   ondansetron 4 MG disintegrating tablet Commonly known as: Zofran ODT   pioglitazone 45 MG tablet Commonly known as: ACTOS       TAKE these medications    alendronate 70 MG tablet Commonly known as:  FOSAMAX Take 70 mg by mouth See admin instructions. Take 70 mg weekly every Sunday   apixaban 5 MG Tabs tablet Commonly known as: ELIQUIS Take 1 tablet (5 mg total) by mouth 2 (two) times daily.   atorvastatin 80 MG tablet Commonly known as: LIPITOR Take 1 tablet (80 mg total) by mouth daily. Start taking on: March 31, 2021   clopidogrel 75 MG tablet Commonly known as: PLAVIX Take 1 tablet (75 mg total) by mouth daily. Start taking on: March 31, 2021   dabrafenib mesylate 50 MG capsule Commonly known as: Tafinlar TAKE 2 CAPSULES (100 MG TOTAL) BY MOUTH 2 (TWO) TIMES DAILY. TAKE ON AN EMPTY STOMACH 1 HOUR BEFORE OR 2 HOURS AFTER MEALS.   escitalopram 10 MG tablet Commonly known as: LEXAPRO Take 10 mg by mouth daily.   glimepiride 1 MG tablet Commonly known as: AMARYL Take 1 tablet (1 mg total) by mouth daily with breakfast. What changed:  medication strength how much to take   ipratropium 0.02 % nebulizer solution Commonly known as: ATROVENT Take 2.5 mLs (0.5 mg total) by nebulization 2 (two) times daily.   levalbuterol 0.63 MG/3ML nebulizer solution Commonly known as: XOPENEX Take 3 mLs (0.63 mg total) by nebulization 2 (two) times daily.   metFORMIN 500 MG 24 hr tablet Commonly known as: GLUCOPHAGE-XR Take 1 tablet (500 mg total) by mouth 2 (two) times daily with a meal. Replaces: metFORMIN 500 MG tablet   metoprolol succinate 25 MG 24 hr tablet Commonly known as: TOPROL-XL Take 1 tablet (25 mg total) by mouth daily. Start taking on: March 31, 2021   nitroGLYCERIN 0.4 MG SL tablet Commonly known as: NITROSTAT Place 0.4 mg under the tongue every 5 (five) minutes as needed for chest pain.   ondansetron 8 MG tablet Commonly known as: ZOFRAN Take 1 tablet (8 mg total) by mouth every 8 (eight) hours as needed for nausea or vomiting. What changed: Another medication with the same name was added. Make sure you understand how and when to take each.    ondansetron 4 MG tablet Commonly known as: ZOFRAN Take 1 tablet (4 mg total) by mouth every 6 (six) hours as needed for nausea. What changed: You were already taking a medication with the same name, and this prescription was added. Make sure you understand how and when to take each.   ONE TOUCH ULTRA TEST test strip Generic drug: glucose blood   polyethylene glycol 17 g packet Commonly known as: MIRALAX / GLYCOLAX Take 17 g by mouth daily as needed for mild constipation.   prochlorperazine 10 MG tablet Commonly known as: COMPAZINE Take 1  tablet (10 mg total) by mouth every 6 (six) hours as needed for nausea or vomiting.   ranolazine 500 MG 12 hr tablet Commonly known as: RANEXA Take 1 tablet (500 mg total) by mouth 2 (two) times daily.   sacubitril-valsartan 24-26 MG Commonly known as: ENTRESTO Take 1 tablet by mouth 2 (two) times daily.   spironolactone 25 MG tablet Commonly known as: ALDACTONE Take 0.5 tablets (12.5 mg total) by mouth daily. Start taking on: March 31, 2021   tamsulosin 0.4 MG Caps capsule Commonly known as: FLOMAX Take 1 capsule (0.4 mg total) by mouth daily after supper.   trametinib dimethyl sulfoxide 0.5 MG tablet Commonly known as: Mekinist TAKE 2 TABLETS (1 MG TOTAL) BY MOUTH DAILY. TAKE 1 HOUR BEFORE OR 2 HOURS AFTER A MEAL. STORE REFRIGERATED IN ORIGINAL CONTAINER.   Vitamin D3 125 MCG (5000 UT) Caps Take 5,000 Units by mouth daily.        Follow-up Information     [name], MD. Schedule an appointment as soon as possible for a visit in 2 week(s).   Specialty: Internal Medicine Why: Hospital Follow Up Contact information: [address] [place] [address] [phone]         Werner Lean, MD. Schedule an appointment as soon as possible for a visit in 2 week(s).   Specialty: Cardiology Why: Hospital Follow Up Contact information: [address] [place] [address] [phone]          [facility]. Schedule an appointment as soon as possible for a visit in 1 week(s).   Specialty: Urology Why: Hospital Follow Up, urinary retention, foley removal, hematuria Contact information: [phone]        Derek Jack, MD. Go on 04/23/2021.   Specialty: Hematology Why: as scheduled for follow up Contact information: [place] [address] [phone]                Allergies  Allergen Reactions   Oxycodone    Allergies as of 03/30/2021       Reactions   Oxycodone         Medication List     STOP taking these medications    amLODipine 10 MG tablet Commonly known as: NORVASC   aspirin EC 81 MG tablet   benzonatate 200 MG capsule Commonly known as: TESSALON   insulin lispro 100 UNIT/ML injection Commonly known as: HUMALOG   insulin lispro 100 UNIT/ML KwikPen Commonly known as: HUMALOG   isosorbide mononitrate 30 MG 24 hr tablet Commonly known as: IMDUR   metFORMIN 500 MG tablet Commonly known as: GLUCOPHAGE Replaced by: metFORMIN 500 MG 24 hr tablet   methocarbamol 500 MG tablet Commonly known as: ROBAXIN   metoprolol tartrate 100 MG tablet Commonly known as: LOPRESSOR   ondansetron 4 MG disintegrating tablet Commonly known as: Zofran ODT   pioglitazone 45 MG tablet Commonly known as: ACTOS       TAKE these medications    alendronate 70 MG tablet Commonly known as: FOSAMAX Take 70 mg by mouth See admin instructions. Take 70 mg weekly every Sunday   apixaban 5 MG Tabs tablet Commonly known as: ELIQUIS Take 1 tablet (5 mg total) by mouth 2 (two) times daily.   atorvastatin 80 MG tablet Commonly known as: LIPITOR Take 1 tablet (80 mg total) by mouth daily. Start taking on: March 31, 2021   clopidogrel 75 MG tablet Commonly known as: PLAVIX Take 1 tablet (75  mg total) by mouth daily. Start taking on: March 31, 2021   dabrafenib mesylate 50 MG capsule Commonly known  as: Tafinlar TAKE 2 CAPSULES (100 MG TOTAL) BY MOUTH 2 (TWO) TIMES DAILY. TAKE ON AN EMPTY STOMACH 1 HOUR BEFORE OR 2 HOURS AFTER MEALS.   escitalopram 10 MG tablet Commonly known as: LEXAPRO Take 10 mg by mouth daily.   glimepiride 1 MG tablet Commonly known as: AMARYL Take 1 tablet (1 mg total) by mouth daily with breakfast. What changed:  medication strength how much to take   ipratropium 0.02 % nebulizer solution Commonly known as: ATROVENT Take 2.5 mLs (0.5 mg total) by nebulization 2 (two) times daily.   levalbuterol 0.63 MG/3ML nebulizer solution Commonly known as: XOPENEX Take 3 mLs (0.63 mg total) by nebulization 2 (two) times daily.   metFORMIN 500 MG 24 hr tablet Commonly known as: GLUCOPHAGE-XR Take 1 tablet (500 mg total) by mouth 2 (two) times daily with a meal. Replaces: metFORMIN 500 MG tablet   metoprolol succinate 25 MG 24 hr tablet Commonly known as: TOPROL-XL Take 1 tablet (25 mg total) by mouth daily. Start taking on: March 31, 2021   nitroGLYCERIN 0.4 MG SL tablet Commonly known as: NITROSTAT Place 0.4 mg under the tongue every 5 (five) minutes as needed for chest pain.   ondansetron 8 MG tablet Commonly known as: ZOFRAN Take 1 tablet (8 mg total) by mouth every 8 (eight) hours as needed for nausea or vomiting. What changed: Another medication with the same name was added. Make sure you understand how and when to take each.   ondansetron 4 MG tablet Commonly known as: ZOFRAN Take 1 tablet (4 mg total) by mouth every 6 (six) hours as needed for nausea. What changed: You were already taking a medication with the same name, and this prescription was added. Make sure you understand how and when to take each.   ONE TOUCH ULTRA TEST test strip Generic drug: glucose blood   polyethylene glycol 17 g packet Commonly known as: MIRALAX / GLYCOLAX Take 17 g by mouth daily as needed for mild constipation.   prochlorperazine 10 MG tablet Commonly known  as: COMPAZINE Take 1 tablet (10 mg total) by mouth every 6 (six) hours as needed for nausea or vomiting.   ranolazine 500 MG 12 hr tablet Commonly known as: RANEXA Take 1 tablet (500 mg total) by mouth 2 (two) times daily.   sacubitril-valsartan 24-26 MG Commonly known as: ENTRESTO Take 1 tablet by mouth 2 (two) times daily.   spironolactone 25 MG tablet Commonly known as: ALDACTONE Take 0.5 tablets (12.5 mg total) by mouth daily. Start taking on: March 31, 2021   tamsulosin 0.4 MG Caps capsule Commonly known as: FLOMAX Take 1 capsule (0.4 mg total) by mouth daily after supper.   trametinib dimethyl sulfoxide 0.5 MG tablet Commonly known as: Mekinist TAKE 2 TABLETS (1 MG TOTAL) BY MOUTH DAILY. TAKE 1 HOUR BEFORE OR 2 HOURS AFTER A MEAL. STORE REFRIGERATED IN ORIGINAL CONTAINER.   Vitamin D3 125 MCG (5000 UT) Caps Take 5,000 Units by mouth daily.        Procedures/Studies: CT ABDOMEN PELVIS WO CONTRAST  Result Date: 03/22/2021 CLINICAL DATA:  Sepsis, leukocytosis bacteremia. Concern for abdominal infection EXAM: CT ABDOMEN AND PELVIS WITHOUT CONTRAST TECHNIQUE: Multidetector CT imaging of the abdomen and pelvis was performed following the standard protocol without IV contrast. COMPARISON:  CT July 23, 2020 FINDINGS: Lower chest: Small bilateral pleural effusions with right greater  than left basilar airspace consolidations. Prior median sternotomy coronary artery calcifications. Hepatobiliary: Unremarkable noncontrast appearance of the hepatic parenchyma. Gallbladder surgically absent. No biliary ductal dilation. Pancreas: No pancreatic ductal dilation or evidence of acute inflammation. Pancreatic atrophy. Spleen: Within normal limits. Adrenals/Urinary Tract: Left greater than right cortical renal atrophy and scarring. Massive distension of the urinary bladder. Prominence of the right ureter and collecting system. No ureteral stone visualized. No bladder calculus. Stomach/Bowel:  Stomach is unremarkable for degree of distension. No pathologic dilation of small or large bowel. Radiopaque enteric contrast traverses the rectum. No evidence of acute bowel inflammation. Vascular/Lymphatic: Aortic and branch vessel atherosclerosis. No abdominal aortic aneurysm. No pathologically enlarged abdominal or pelvic lymph nodes. Reproductive: Dystrophic prostatic calcifications. Stable penile calcifications. Other: Trace abdominopelvic free fluid.  No pneumoperitoneum. Musculoskeletal: Multilevel degenerative changes spine. No acute osseous abnormality. IMPRESSION: 1. Massive distension of the urinary bladder with prominence of the right ureter and collecting system, likely related to back pressure. Consider in and out catheterization if patient is unable to void. 2. Small bilateral pleural effusions with right greater than left basilar airspace consolidations, concerning for pneumonia with parapneumonic effusion. 3. Trace abdominopelvic free fluid. 4. Aortic Atherosclerosis (ICD10-I70.0). Electronically Signed   By: Dahlia Bailiff M.D.   On: 03/22/2021 17:41   DG Chest Port 1 View  Result Date: 03/20/2021 CLINICAL DATA:  Questionable sepsis. EXAM: PORTABLE CHEST 1 VIEW COMPARISON:  Chest x-ray 12/27/2017. FINDINGS: Patient is status post cardiac surgery. There are atherosclerotic calcifications of the aorta. There are new reticulonodular opacities in both lower lungs, right greater than left. Costophrenic angles are clear. There is no pneumothorax. No acute fractures are seen. IMPRESSION: 1. Reticulonodular opacities in the bilateral lung bases, right greater than left. Findings may be related to increasing pulmonary nodules and/or superimposed infection. Electronically Signed   By: Ronney Asters M.D.   On: 03/20/2021 18:38   ECHOCARDIOGRAM COMPLETE  Result Date: 03/21/2021    ECHOCARDIOGRAM REPORT   Patient Name:   ZEKIEL TORIAN Date of Exam: 03/21/2021 Medical Rec #:  [id number]     Height:        71.0 in Accession #:    [id number]    Weight:       169.5 lb Date of Birth:  May 30, 1940     BSA:          1.966 m Patient Age:    80 years      BP:           1417/63 mmHg Patient Gender: M             HR:           67 bpm. Exam Location:  [facility] Procedure: 2D Echo, Cardiac Doppler and Color Doppler Indications:    STEMI (ST elevation myocardial infarction) [facility]) [[id number]]  History:        Patient has prior history of Echocardiogram examinations, most                 recent 01/09/2021. CAD; Risk Factors:Diabetes and Hypertension.  Sonographer:    Bernadene Person RDCS Referring Phys: [id number] [place]  1. Distal septal, apical mid and basal inferior wall hypokinesis . Left ventricular ejection fraction, by estimation, is 35 to 40%. The left ventricle has moderately decreased function. The left ventricle demonstrates regional wall motion abnormalities (see scoring diagram/findings for description). Left ventricular diastolic parameters were normal.  2. Right ventricular systolic function is normal. The right ventricular size  is normal. There is moderately elevated pulmonary artery systolic pressure.  3. Left atrial size was moderately dilated.  4. 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 The mitral valve is abnormal. Mild mitral valve regurgitation. No evidence of mitral stenosis.  5. The aortic valve is tricuspid. There is mild calcification of the aortic valve. Aortic valve regurgitation is not visualized. Mild aortic valve sclerosis is present, with no evidence of aortic valve stenosis.  6. The inferior vena cava is normal in size with greater than 50% respiratory variability, suggesting right atrial pressure of 3 mmHg. FINDINGS  Left Ventricle: Distal septal, apical mid and basal inferior wall hypokinesis. Left ventricular ejection fraction, by estimation, is 35 to 40%. The left ventricle has moderately decreased function. The left ventricle demonstrates regional wall motion abnormalities. The left ventricular  internal cavity size was normal in size. There is no left ventricular hypertrophy. Left ventricular diastolic parameters were normal. Right Ventricle: The right ventricular size is normal. No increase in right ventricular wall thickness. Right ventricular systolic function is normal. There is moderately elevated pulmonary artery systolic pressure. The tricuspid regurgitant velocity is 3.29 m/s, and with an assumed right atrial pressure of 8 mmHg, the estimated right ventricular systolic pressure is 62.1 mmHg. Left Atrium: Left atrial size was moderately dilated. Right Atrium: Right atrial size was normal in size. Pericardium: There is no evidence of pericardial effusion. 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 The mitral valve is abnormal. There is mild thickening of the mitral valve leaflet(s). There is mild calcification of the mitral valve leaflet(s). Mild mitral valve regurgitation. No evidence of mitral valve stenosis. Tricuspid Valve: The tricuspid valve is normal in structure. Tricuspid valve regurgitation is mild . No evidence of tricuspid stenosis. Aortic Valve: The aortic valve is tricuspid. There is mild calcification of the aortic valve. Aortic valve regurgitation is not visualized. Mild aortic valve sclerosis is present, with no evidence of aortic valve stenosis. Pulmonic Valve: The pulmonic valve was normal in structure. Pulmonic valve regurgitation is trivial. No evidence of pulmonic stenosis. Aorta: The aortic root is normal in size and structure. Venous: The inferior vena cava is normal in size with greater than 50% respiratory variability, suggesting right atrial pressure of 3 mmHg. IAS/Shunts: The interatrial septum was not assessed.  LEFT VENTRICLE PLAX 2D LVIDd:         5.40 cm LVIDs:         3.80 cm LV PW:         1.30 cm LV IVS:        0.80 cm LVOT diam:     2.00 cm LV SV:         51 LV SV Index:   26 LVOT Area:     3.14 cm  LV Volumes (MOD) LV vol d, MOD A2C: 110.0 ml LV vol d, MOD A4C: 97.8 ml LV  vol s, MOD A2C: 76.2 ml LV vol s, MOD A4C: 62.3 ml LV SV MOD A2C:     33.8 ml LV SV MOD A4C:     97.8 ml LV SV MOD BP:      36.7 ml RIGHT VENTRICLE RV S prime:     7.15 cm/s TAPSE (M-mode): 1.2 cm LEFT ATRIUM             Index        RIGHT ATRIUM           Index LA diam:        4.30 cm 2.19 cm/m   RA Area:  15.20 cm LA Vol (A2C):   53.1 ml 27.02 ml/m  RA Volume:   37.90 ml  19.28 ml/m LA Vol (A4C):   46.2 ml 23.50 ml/m LA Biplane Vol: 50.0 ml 25.44 ml/m  AORTIC VALVE LVOT Vmax:   83.60 cm/s LVOT Vmean:  54.100 cm/s LVOT VTI:    0.163 m  AORTA Ao Root diam: 3.70 cm Ao Asc diam:  3.40 cm MR Peak grad: 115.3 mmHg  TRICUSPID VALVE MR Mean grad: 80.0 mmHg   TR Peak grad:   43.3 mmHg MR Vmax:      537.00 cm/s TR Vmax:        329.00 cm/s MR Vmean:     435.0 cm/s                           SHUNTS                           Systemic VTI:  0.16 m                           Systemic Diam: 2.00 cm Jenkins Rouge MD Electronically signed by Jenkins Rouge MD Signature Date/Time: 03/21/2021/1:57:44 PM    Final    ECHO TEE  Result Date: 03/27/2021    TRANSESOPHOGEAL ECHO REPORT   Patient Name:   AEDAN [facility] Date of Exam: 03/27/2021 Medical Rec #:  [id number]     Height:       71.0 in Accession #:    [id number]    Weight:       197.5 lb Date of Birth:  June 30, 1940     BSA:          2.097 m Patient Age:    15 years      BP:           115/71 mmHg Patient Gender: M             HR:           79 bpm. Exam Location:  [facility] Procedure: Transesophageal Echo, Cardiac Doppler and Color Doppler Indications:     Bacteremia  History:         Patient has prior history of Echocardiogram examinations, most                  recent 03/21/2021. CAD and Previous Myocardial Infarction, Prior                  CABG, Arrythmias:Atrial Fibrillation,                  Signs/Symptoms:Bacteremia; Risk Factors:Diabetes and                  Hypertension. Lung cancer, Pneumonia.  Sonographer:     Dustin Flock RDCS Referring Phys:  [id number] Erma Heritage Diagnosing Phys: Carlyle Dolly MD PROCEDURE: The transesophogeal probe was passed without difficulty through the esophogus of the patient. Sedation performed by different physician. The patient was monitored while under deep sedation. Anesthestetic sedation was provided intravenously by Anesthesiology: 79mg  of Propofol. The patient developed no complications during the procedure. IMPRESSIONS  1. Left ventricular ejection fraction, by estimation, is 35 to 40%. The left ventricle has moderately decreased function.  2. Right ventricular systolic function is normal. The right ventricular size is mildly enlarged.  3. Left atrial size was severely dilated. No left  atrial/left atrial appendage thrombus was detected. The LAA emptying velocity was 56 cm/s.  4. Right atrial size was severely dilated.  5. The mitral valve is abnormal. Moderate mitral valve regurgitation. No evidence of mitral stenosis.  6. The tricuspid valve is abnormal. Tricuspid valve regurgitation is moderate.  7. The aortic valve is tricuspid. Aortic valve regurgitation is not visualized. No aortic stenosis is present.  8. There is mild (Grade II) plaque. Conclusion(s)/Recommendation(s): No evidence of vegetation/infective endocarditis on this transesophageael echocardiogram. FINDINGS  Left Ventricle: Left ventricular ejection fraction, by estimation, is 35 to 40%. The left ventricle has moderately decreased function. The left ventricular internal cavity size was normal in size. Right Ventricle: The right ventricular size is mildly enlarged. No increase in right ventricular wall thickness. Right ventricular systolic function is normal. Left Atrium: Left atrial size was severely dilated. No left atrial/left atrial appendage thrombus was detected. The LAA emptying velocity was 56 cm/s. Right Atrium: Right atrial size was severely dilated. Pericardium: There is no evidence of pericardial effusion. Mitral Valve: The MR vena contracta is 0.4 cm. The  mitral valve is abnormal. Moderate mitral valve regurgitation. No evidence of mitral valve stenosis. Tricuspid Valve: The tricuspid valve is abnormal. Tricuspid valve regurgitation is moderate . No evidence of tricuspid stenosis. Aortic Valve: The aortic valve is tricuspid. Aortic valve regurgitation is not visualized. No aortic stenosis is present. Aortic valve mean gradient measures 2.0 mmHg. Aortic valve peak gradient measures 4.7 mmHg. Pulmonic Valve: The pulmonic valve was normal in structure. Pulmonic valve regurgitation is mild. No evidence of pulmonic stenosis. Aorta: The aortic root is normal in size and structure. There is mild (Grade II) plaque. IAS/Shunts: No atrial level shunt detected by color flow Doppler.  AORTIC VALVE AV Vmax:           108.00 cm/s AV Vmean:          72.700 cm/s AV VTI:            0.218 m AV Peak Grad:      4.7 mmHg AV Mean Grad:      2.0 mmHg LVOT Vmax:         78.00 cm/s LVOT Vmean:        50.300 cm/s LVOT VTI:          0.139 m LVOT/AV VTI ratio: 0.64  AORTA Ao Root diam: 3.60 cm  SHUNTS Systemic VTI: 0.14 m Carlyle Dolly MD Electronically signed by Carlyle Dolly MD Signature Date/Time: 03/27/2021/11:50:29 AM    Final      Subjective: Pt without complaints, he says he feels well and he is agreeable to going to SNF rehab.   Discharge Exam: Vitals:   03/30/21 0744 03/30/21 0813  BP:  133/82  Pulse:  92  Resp:    Temp:    SpO2: 96% 96%   Vitals:   03/30/21 0513 03/30/21 0628 03/30/21 0744 03/30/21 0813  BP: 122/66   133/82  Pulse: 85   92  Resp: 19     Temp: 98 F (36.7 C)     TempSrc: Oral     SpO2: 94%  96% 96%  Weight:  84.4 kg    Height:       General exam: Chronically ill-appearing male lying in the bed he is awake and alert in no apparent distress appears calm and comfortable.  Respiratory system: good air movment bilaterally. Respiratory effort normal. Cardiovascular system: normal S1 & S2 heard. No JVD, murmurs, rubs, gallops or clicks. No  pedal edema. Gastrointestinal system: Abdomen is nondistended, soft and nontender. No organomegaly or masses felt. Normal bowel sounds heard. Central nervous system: Alert and oriented. No focal neurological deficits. Extremities: Symmetric 5 x 5 power. Skin: No rashes, lesions or ulcers Psychiatry: Judgement and insight appear normal. Mood & affect appropriate.   The results of significant diagnostics from this hospitalization (including imaging, microbiology, ancillary and laboratory) are listed below for reference.     Microbiology: Recent Results (from the past 240 hour(s))  Blood Culture (routine x 2)     Status: None   Collection Time: 03/20/21  5:57 PM   Specimen: BLOOD LEFT ARM  Result Value Ref Range Status   Specimen Description BLOOD LEFT ARM  Final   Special Requests   Final    BOTTLES DRAWN AEROBIC ONLY Blood Culture adequate volume   Culture   Final    NO GROWTH 5 DAYS Performed at [facility], [address] [place], [place] [address]    Report Status 03/25/2021 FINAL  Final  Blood Culture (routine x 2)     Status: Abnormal   Collection Time: 03/20/21  5:57 PM   Specimen: Right Antecubital; Blood  Result Value Ref Range Status   Specimen Description   Final    RIGHT ANTECUBITAL Performed at [facility], [address] [place], [place] [address]    Special Requests   Final    BOTTLES DRAWN AEROBIC AND ANAEROBIC Blood Culture results may not be optimal due to an inadequate volume of blood received in culture bottles Performed at [facility], [address], [place], [place] [address]    Culture  Setup Time   Final    IN BOTH AEROBIC AND ANAEROBIC BOTTLES GRAM POSITIVE COCCI CRITICAL RESULT CALLED TO, READ BACK BY AND VERIFIED WITH: MILLS,M 1629 03/21/2021 COLEMAN,R CRITICAL RESULT CALLED TO, READ BACK BY AND VERIFIED WITH: RN T.ALLEN ON [id number] AT 2023 BY E.PARRISH Performed at [facility], [place] [address], [place], [place] [address]    Culture  ENTEROCOCCUS FAECALIS (A)  Final   Report Status 03/23/2021 FINAL  Final   Organism ID, Bacteria ENTEROCOCCUS FAECALIS  Final      Susceptibility   Enterococcus faecalis - MIC*    AMPICILLIN <=2 SENSITIVE Sensitive     VANCOMYCIN <=0.5 SENSITIVE Sensitive     GENTAMICIN SYNERGY SENSITIVE Sensitive     * ENTEROCOCCUS FAECALIS  Blood Culture ID Panel (Reflexed)     Status: Abnormal   Collection Time: 03/20/21  5:57 PM  Result Value Ref Range Status   Enterococcus faecalis DETECTED (A) NOT DETECTED Final    Comment: CRITICAL RESULT CALLED TO, READ BACK BY AND VERIFIED WITH: RN T.ALLEN ON [id number] AT 2023 BY E.PARRISH    Enterococcus Faecium NOT DETECTED NOT DETECTED Final   Listeria monocytogenes NOT DETECTED NOT DETECTED Final   Staphylococcus species NOT DETECTED NOT DETECTED Final   Staphylococcus aureus (BCID) NOT DETECTED NOT DETECTED Final   Staphylococcus epidermidis NOT DETECTED NOT DETECTED Final   Staphylococcus lugdunensis NOT DETECTED NOT DETECTED Final   Streptococcus species NOT DETECTED NOT DETECTED Final   Streptococcus agalactiae NOT DETECTED NOT DETECTED Final   Streptococcus pneumoniae NOT DETECTED NOT DETECTED Final   Streptococcus pyogenes NOT DETECTED NOT DETECTED Final   A.calcoaceticus-baumannii NOT DETECTED NOT DETECTED Final   Bacteroides fragilis NOT DETECTED NOT DETECTED Final   Enterobacterales NOT DETECTED NOT DETECTED Final   Enterobacter cloacae complex NOT DETECTED NOT DETECTED Final   Escherichia coli  NOT DETECTED NOT DETECTED Final   Klebsiella aerogenes NOT DETECTED NOT DETECTED Final   Klebsiella oxytoca NOT DETECTED NOT DETECTED Final   Klebsiella pneumoniae NOT DETECTED NOT DETECTED Final   Proteus species NOT DETECTED NOT DETECTED Final   Salmonella species NOT DETECTED NOT DETECTED Final   Serratia marcescens NOT DETECTED NOT DETECTED Final   Haemophilus influenzae NOT DETECTED NOT DETECTED Final   Neisseria meningitidis NOT DETECTED NOT  DETECTED Final   Pseudomonas aeruginosa NOT DETECTED NOT DETECTED Final   Stenotrophomonas maltophilia NOT DETECTED NOT DETECTED Final   Candida albicans NOT DETECTED NOT DETECTED Final   Candida auris NOT DETECTED NOT DETECTED Final   Candida glabrata NOT DETECTED NOT DETECTED Final   Candida krusei NOT DETECTED NOT DETECTED Final   Candida parapsilosis NOT DETECTED NOT DETECTED Final   Candida tropicalis NOT DETECTED NOT DETECTED Final   Cryptococcus neoformans/gattii NOT DETECTED NOT DETECTED Final   Vancomycin resistance NOT DETECTED NOT DETECTED Final    Comment: Performed at [facility], [place] [address], [place], [place] [address]  Resp Panel by RT-PCR (Flu A&B, Covid) Nasopharyngeal Swab     Status: None   Collection Time: 03/20/21  6:25 PM   Specimen: Nasopharyngeal Swab; Nasopharyngeal(NP) swabs in vial transport medium  Result Value Ref Range Status   SARS Coronavirus 2 by RT PCR NEGATIVE NEGATIVE Final    Comment: (NOTE) SARS-CoV-2 target nucleic acids are NOT DETECTED.  The SARS-CoV-2 RNA is generally detectable in upper respiratory specimens during the acute phase of infection. The lowest concentration of SARS-CoV-2 viral copies this assay can detect is 138 copies/mL. A negative result does not preclude SARS-Cov-2 infection and should not be used as the sole basis for treatment or other patient management decisions. A negative result may occur with  improper specimen collection/handling, submission of specimen other than nasopharyngeal swab, presence of viral mutation(s) within the areas targeted by this assay, and inadequate number of viral copies(<138 copies/mL). A negative result must be combined with clinical observations, patient history, and epidemiological information. The expected result is Negative.  Fact Sheet for Patients:  EntrepreneurPulse.com.au  Fact Sheet for Healthcare Providers:   IncredibleEmployment.be  This test is no t yet approved or cleared by the [place] FDA and  has been authorized for detection and/or diagnosis of SARS-CoV-2 by FDA under an Emergency Use Authorization (EUA). This EUA will remain  in effect (meaning this test can be used) for the duration of the COVID-19 declaration under Section 564(b)(1) of the Act, 21 U.S.C.section 360bbb-3(b)(1), unless the authorization is terminated  or revoked sooner.       Influenza A by PCR NEGATIVE NEGATIVE Final   Influenza B by PCR NEGATIVE NEGATIVE Final    Comment: (NOTE) The Xpert Xpress SARS-CoV-2/FLU/RSV plus assay is intended as an aid in the diagnosis of influenza from Nasopharyngeal swab specimens and should not be used as a sole basis for treatment. Nasal washings and aspirates are unacceptable for Xpert Xpress SARS-CoV-2/FLU/RSV testing.  Fact Sheet for Patients: EntrepreneurPulse.com.au  Fact Sheet for Healthcare Providers: IncredibleEmployment.be  This test is not yet approved or cleared by the [place] FDA and has been authorized for detection and/or diagnosis of SARS-CoV-2 by FDA under an Emergency Use Authorization (EUA). This EUA will remain in effect (meaning this test can be used) for the duration of the COVID-19 declaration under Section 564(b)(1) of the Act, 21 U.S.C. section 360bbb-3(b)(1), unless the authorization is terminated or revoked.  Performed at [facility], [address] [place], [place] [address]   MRSA Next Gen by PCR, Nasal     Status: None   Collection Time: 03/20/21 11:37 PM   Specimen: Nasal Mucosa; Nasal Swab  Result Value Ref Range Status   MRSA by PCR Next Gen NOT DETECTED NOT DETECTED Final    Comment: (NOTE) The GeneXpert MRSA Assay (FDA approved for NASAL specimens only), is one component of a comprehensive MRSA colonization surveillance program. It is not intended to diagnose MRSA  infection nor to guide or monitor treatment for MRSA infections. Test performance is not FDA approved in patients less than 58 years old. Performed at [facility], [address] [place], [place] [address]   Urine Culture     Status: Abnormal   Collection Time: 03/22/21  8:27 AM   Specimen: Urine, Clean Catch  Result Value Ref Range Status   Specimen Description   Final    URINE, CLEAN CATCH Performed at [facility], [address], [place], [place] [address]    Special Requests   Final    NONE Performed at [facility], [address], [place], [place] [address]    Culture MULTIPLE SPECIES PRESENT, SUGGEST RECOLLECTION (A)  Final   Report Status 03/23/2021 FINAL  Final  Culture, blood (Routine X 2) w Reflex to ID Panel     Status: None   Collection Time: 03/22/21 12:07 PM   Specimen: BLOOD RIGHT ARM  Result Value Ref Range Status   Specimen Description BLOOD RIGHT ARM BOTTLES DRAWN AEROBIC ONLY  Final   Special Requests Blood Culture adequate volume  Final   Culture   Final    NO GROWTH 5 DAYS Performed at [facility], [address], [place], [place] [address]    Report Status 03/27/2021 FINAL  Final  Culture, blood (Routine X 2) w Reflex to ID Panel     Status: None   Collection Time: 03/22/21 12:22 PM   Specimen: BLOOD RIGHT HAND  Result Value Ref Range Status   Specimen Description BLOOD RIGHT HAND BOTTLES DRAWN AEROBIC ONLY  Final   Special Requests Blood Culture adequate volume  Final   Culture   Final    NO GROWTH 5 DAYS Performed at [facility], [address] [place], [place] [address]    Report Status 03/27/2021 FINAL  Final  Urine Culture     Status: None   Collection Time: 03/24/21  5:00 AM   Specimen: Urine, Clean Catch  Result Value Ref Range Status   Specimen Description   Final    URINE, CLEAN CATCH Performed at [facility], [address], [place], [place] [address]    Special Requests   Final    NONE Performed at [facility], [address], [place], [place] [address]    Culture   Final    NO GROWTH Performed at [facility], [place] [address] [place], [place] [address]    Report Status 03/26/2021 FINAL  Final     Labs: BNP (last 3 results) No results for input(s): BNP in the last 8760 hours. Basic Metabolic Panel: Recent Labs  Lab 03/24/21 0421 03/25/21 0413 03/26/21 0541 03/28/21 0442 03/29/21 0358  NA 130* 133* 133* 133* 133*  K 3.8 3.3* 3.7 3.0* 3.6  CL 105 103 102 100 97*  CO2 21* 22 25 27 28   GLUCOSE 164* 111* 140* 177* 151*  BUN 9 8 7* 6* 7*  CREATININE 1.22 1.22 1.30* 1.23 1.13  CALCIUM  6.9* 6.9* 7.1* 7.2* 7.2*  MG 1.7 2.0 1.9 1.7  --   PHOS 3.1 4.4  --   --   --    Liver Function Tests: No results for input(s): AST, ALT, ALKPHOS, BILITOT, PROT, ALBUMIN in the last 168 hours. No results for input(s): LIPASE, AMYLASE in the last 168 hours. No results for input(s): AMMONIA in the last 168 hours. CBC: Recent Labs  Lab 03/25/21 0413 03/26/21 0541 03/27/21 0529 03/28/21 0442 03/29/21 0358  WBC 6.6 6.2 5.2 5.6 5.9  HGB 8.1* 8.5* 9.3* 9.1* 9.2*  HCT 24.1* 26.2* 28.8* 28.0* 27.8*  MCV 74.6* 78.2* 76.8* 78.4* 77.9*  PLT 209 217 218 191 205   Cardiac Enzymes: No results for input(s): CKTOTAL, CKMB, CKMBINDEX, TROPONINI in the last 168 hours. BNP: Invalid input(s): POCBNP CBG: Recent Labs  Lab 03/29/21 0751 03/29/21 1150 03/29/21 1637 03/29/21 2125 03/30/21 0709  GLUCAP 162* 182* 180* 225* 194*   D-Dimer No results for input(s): DDIMER in the last 72 hours. Hgb A1c No results for input(s): HGBA1C in the last 72 hours. Lipid Profile No results for input(s): CHOL, HDL, LDLCALC, TRIG, CHOLHDL, LDLDIRECT in the last 72 hours. Thyroid function studies No results for input(s): TSH, T4TOTAL, T3FREE, THYROIDAB in the last 72 hours.  Invalid input(s): FREET3 Anemia work up No results for input(s): VITAMINB12, FOLATE, FERRITIN, TIBC, IRON, RETICCTPCT in the last 72 hours. Urinalysis     Component Value Date/Time   COLORURINE YELLOW 03/24/2021 0500   APPEARANCEUR HAZY (A) 03/24/2021 0500   LABSPEC 1.011 03/24/2021 0500   PHURINE 6.0 03/24/2021 0500   GLUCOSEU 50 (A) 03/24/2021 0500   HGBUR LARGE (A) 03/24/2021 0500   BILIRUBINUR NEGATIVE 03/24/2021 0500   KETONESUR NEGATIVE 03/24/2021 0500   PROTEINUR NEGATIVE 03/24/2021 0500   UROBILINOGEN 0.2 05/05/2007 1658   NITRITE NEGATIVE 03/24/2021 0500   LEUKOCYTESUR TRACE (A) 03/24/2021 0500   Sepsis Labs Invalid input(s): PROCALCITONIN,  WBC,  LACTICIDVEN Microbiology Recent Results (from the past 240 hour(s))  Blood Culture (routine x 2)     Status: None   Collection Time: 03/20/21  5:57 PM   Specimen: BLOOD LEFT ARM  Result Value Ref Range Status   Specimen Description BLOOD LEFT ARM  Final   Special Requests   Final    BOTTLES DRAWN AEROBIC ONLY Blood Culture adequate volume   Culture   Final    NO GROWTH 5 DAYS Performed at [facility], [address] [place], [place] [address]    Report Status 03/25/2021 FINAL  Final  Blood Culture (routine x 2)     Status: Abnormal   Collection Time: 03/20/21  5:57 PM   Specimen: Right Antecubital; Blood  Result Value Ref Range Status   Specimen Description   Final    RIGHT ANTECUBITAL Performed at [facility], [address], [place], [place] [address]    Special Requests   Final    BOTTLES DRAWN AEROBIC AND ANAEROBIC Blood Culture results may not be optimal due to an inadequate volume of blood received in culture bottles Performed at [facility], [address], [place], [place] [address]    Culture  Setup Time   Final    IN BOTH AEROBIC AND ANAEROBIC BOTTLES GRAM POSITIVE COCCI CRITICAL RESULT CALLED TO, READ BACK BY AND VERIFIED WITH: MILLS,M 1629 03/21/2021 COLEMAN,R CRITICAL RESULT CALLED TO, READ BACK BY AND VERIFIED WITH: RN T.ALLEN ON [id number] AT 2023 BY E.PARRISH Performed at [facility], [place] [address]  [place], [place] [address]    Culture  ENTEROCOCCUS FAECALIS (A)  Final   Report Status 03/23/2021 FINAL  Final   Organism ID, Bacteria ENTEROCOCCUS FAECALIS  Final      Susceptibility   Enterococcus faecalis - MIC*    AMPICILLIN <=2 SENSITIVE Sensitive     VANCOMYCIN <=0.5 SENSITIVE Sensitive     GENTAMICIN SYNERGY SENSITIVE Sensitive     * ENTEROCOCCUS FAECALIS  Blood Culture ID Panel (Reflexed)     Status: Abnormal   Collection Time: 03/20/21  5:57 PM  Result Value Ref Range Status   Enterococcus faecalis DETECTED (A) NOT DETECTED Final    Comment: CRITICAL RESULT CALLED TO, READ BACK BY AND VERIFIED WITH: RN T.ALLEN ON [id number] AT 2023 BY E.PARRISH    Enterococcus Faecium NOT DETECTED NOT DETECTED Final   Listeria monocytogenes NOT DETECTED NOT DETECTED Final   Staphylococcus species NOT DETECTED NOT DETECTED Final   Staphylococcus aureus (BCID) NOT DETECTED NOT DETECTED Final   Staphylococcus epidermidis NOT DETECTED NOT DETECTED Final   Staphylococcus lugdunensis NOT DETECTED NOT DETECTED Final   Streptococcus species NOT DETECTED NOT DETECTED Final   Streptococcus agalactiae NOT DETECTED NOT DETECTED Final   Streptococcus pneumoniae NOT DETECTED NOT DETECTED Final   Streptococcus pyogenes NOT DETECTED NOT DETECTED Final   A.calcoaceticus-baumannii NOT DETECTED NOT DETECTED Final   Bacteroides fragilis NOT DETECTED NOT DETECTED Final   Enterobacterales NOT DETECTED NOT DETECTED Final   Enterobacter cloacae complex NOT DETECTED NOT DETECTED Final   Escherichia coli NOT DETECTED NOT DETECTED Final   Klebsiella aerogenes NOT DETECTED NOT DETECTED Final   Klebsiella oxytoca NOT DETECTED NOT DETECTED Final   Klebsiella pneumoniae NOT DETECTED NOT DETECTED Final   Proteus species NOT DETECTED NOT DETECTED Final   Salmonella species NOT DETECTED NOT DETECTED Final   Serratia marcescens NOT DETECTED NOT DETECTED Final   Haemophilus influenzae NOT DETECTED NOT DETECTED Final   Neisseria meningitidis NOT DETECTED NOT  DETECTED Final   Pseudomonas aeruginosa NOT DETECTED NOT DETECTED Final   Stenotrophomonas maltophilia NOT DETECTED NOT DETECTED Final   Candida albicans NOT DETECTED NOT DETECTED Final   Candida auris NOT DETECTED NOT DETECTED Final   Candida glabrata NOT DETECTED NOT DETECTED Final   Candida krusei NOT DETECTED NOT DETECTED Final   Candida parapsilosis NOT DETECTED NOT DETECTED Final   Candida tropicalis NOT DETECTED NOT DETECTED Final   Cryptococcus neoformans/gattii NOT DETECTED NOT DETECTED Final   Vancomycin resistance NOT DETECTED NOT DETECTED Final    Comment: Performed at [facility], [address] [place], [place] [address]  Resp Panel by RT-PCR (Flu A&B, Covid) Nasopharyngeal Swab     Status: None   Collection Time: 03/20/21  6:25 PM   Specimen: Nasopharyngeal Swab; Nasopharyngeal(NP) swabs in vial transport medium  Result Value Ref Range Status   SARS Coronavirus 2 by RT PCR NEGATIVE NEGATIVE Final    Comment: (NOTE) SARS-CoV-2 target nucleic acids are NOT DETECTED.  The SARS-CoV-2 RNA is generally detectable in upper respiratory specimens during the acute phase of infection. The lowest concentration of SARS-CoV-2 viral copies this assay can detect is 138 copies/mL. A negative result does not preclude SARS-Cov-2 infection and should not be used as the sole basis for treatment or other patient management decisions. A negative result may occur with  improper specimen collection/handling, submission of specimen other than nasopharyngeal swab, presence of viral mutation(s) within the areas targeted by this assay, and inadequate number of viral copies(<138 copies/mL). A  negative result must be combined with clinical observations, patient history, and epidemiological information. The expected result is Negative.  Fact Sheet for Patients:  EntrepreneurPulse.com.au  Fact Sheet for Healthcare Providers:   IncredibleEmployment.be  This test is no t yet approved or cleared by the [place] FDA and  has been authorized for detection and/or diagnosis of SARS-CoV-2 by FDA under an Emergency Use Authorization (EUA). This EUA will remain  in effect (meaning this test can be used) for the duration of the COVID-19 declaration under Section 564(b)(1) of the Act, 21 U.S.C.section 360bbb-3(b)(1), unless the authorization is terminated  or revoked sooner.       Influenza A by PCR NEGATIVE NEGATIVE Final   Influenza B by PCR NEGATIVE NEGATIVE Final    Comment: (NOTE) The Xpert Xpress SARS-CoV-2/FLU/RSV plus assay is intended as an aid in the diagnosis of influenza from Nasopharyngeal swab specimens and should not be used as a sole basis for treatment. Nasal washings and aspirates are unacceptable for Xpert Xpress SARS-CoV-2/FLU/RSV testing.  Fact Sheet for Patients: EntrepreneurPulse.com.au  Fact Sheet for Healthcare Providers: IncredibleEmployment.be  This test is not yet approved or cleared by the [place] FDA and has been authorized for detection and/or diagnosis of SARS-CoV-2 by FDA under an Emergency Use Authorization (EUA). This EUA will remain in effect (meaning this test can be used) for the duration of the COVID-19 declaration under Section 564(b)(1) of the Act, 21 U.S.C. section 360bbb-3(b)(1), unless the authorization is terminated or revoked.  Performed at [facility], [address], [place], [place] [address]   MRSA Next Gen by PCR, Nasal     Status: None   Collection Time: 03/20/21 11:37 PM   Specimen: Nasal Mucosa; Nasal Swab  Result Value Ref Range Status   MRSA by PCR Next Gen NOT DETECTED NOT DETECTED Final    Comment: (NOTE) The GeneXpert MRSA Assay (FDA approved for NASAL specimens only), is one component of a comprehensive MRSA colonization surveillance program. It is not intended to diagnose MRSA  infection nor to guide or monitor treatment for MRSA infections. Test performance is not FDA approved in patients less than 12 years old. Performed at [facility], [address] [place], [place] [address]   Urine Culture     Status: Abnormal   Collection Time: 03/22/21  8:27 AM   Specimen: Urine, Clean Catch  Result Value Ref Range Status   Specimen Description   Final    URINE, CLEAN CATCH Performed at [facility], [address] [place], [place] [address]    Special Requests   Final    NONE Performed at [facility], [address], [place], [place] [address]    Culture MULTIPLE SPECIES PRESENT, SUGGEST RECOLLECTION (A)  Final   Report Status 03/23/2021 FINAL  Final  Culture, blood (Routine X 2) w Reflex to ID Panel     Status: None   Collection Time: 03/22/21 12:07 PM   Specimen: BLOOD RIGHT ARM  Result Value Ref Range Status   Specimen Description BLOOD RIGHT ARM BOTTLES DRAWN AEROBIC ONLY  Final   Special Requests Blood Culture adequate volume  Final   Culture   Final    NO GROWTH 5 DAYS Performed at [facility], [address] [place],  [address]    Report Status 03/27/2021 FINAL  Final  Culture, blood (Routine X 2) w Reflex to ID Panel     Status: None   Collection Time: 03/22/21 12:22 PM   Specimen: BLOOD RIGHT HAND  Result Value Ref Range Status   Specimen Description BLOOD RIGHT HAND BOTTLES DRAWN AEROBIC ONLY  Final   Special Requests Blood Culture adequate volume  Final   Culture   Final    NO GROWTH 5 DAYS Performed at [facility], [address], [place], [place] [address]    Report Status 03/27/2021 FINAL  Final  Urine Culture     Status: None   Collection Time: 03/24/21  5:00 AM   Specimen: Urine, Clean Catch  Result Value Ref Range Status   Specimen Description   Final    URINE, CLEAN CATCH Performed at [facility], [address], [place], [place] [address]    Special Requests   Final    NONE Performed at [facility], [address] [place], [place] [address]    Culture   Final    NO GROWTH Performed at [facility], [place] [address], [place], [place] [address]    Report Status 03/26/2021 FINAL  Final   Time coordinating discharge: 45 mins   SIGNED:  Irwin Brakeman, MD  [facility] 03/30/2021, 9:02 AM How to contact the [facility] Attending or Consulting provider [place] or covering provider during after hours [place], for this patient?  Check the care team in [facility] and look for a) attending/consulting [facility] provider listed and b) the [facility] team listed Log into [email or web address] and use [place]'s universal password to access. If you do not have the password, please contact the hospital operator. Locate the [facility] provider you are looking for under [facility] and page to a number that you can be directly reached. If you still have difficulty reaching the provider, please page the [facility] (Director on Call) for the Hospitalists listed on amion for assistance.

## 2021-03-30 NOTE — Care Management Important Message (Signed)
Important Message  Patient Details  Name: [name] MRN: [id number] Date of Birth: 04/28/1941   Medicare Important Message Given:  Yes     Tommy Medal 03/30/2021, 11:31 AM

## 2021-03-30 NOTE — Progress Notes (Signed)
Nsg Discharge Note  Admit Date:  03/20/2021 Discharge date: 03/30/2021   [name] to be D/C'd Skilled nursing facility per MD order.  AVS completed.  Copy for chart, and copy for patient signed, and dated. Patient/caregiver able to verbalize understanding. IV removed. Discharge paper work placed in packet for receiving facility. Patient being transported via EMS   Discharge Medication: Allergies as of 03/30/2021       Reactions   Oxycodone         Medication List     STOP taking these medications    amLODipine 10 MG tablet Commonly known as: NORVASC   aspirin EC 81 MG tablet   benzonatate 200 MG capsule Commonly known as: TESSALON   insulin lispro 100 UNIT/ML injection Commonly known as: HUMALOG   insulin lispro 100 UNIT/ML KwikPen Commonly known as: HUMALOG   isosorbide mononitrate 30 MG 24 hr tablet Commonly known as: IMDUR   metFORMIN 500 MG tablet Commonly known as: GLUCOPHAGE Replaced by: metFORMIN 500 MG 24 hr tablet   methocarbamol 500 MG tablet Commonly known as: ROBAXIN   metoprolol tartrate 100 MG tablet Commonly known as: LOPRESSOR   ondansetron 4 MG disintegrating tablet Commonly known as: Zofran ODT   pioglitazone 45 MG tablet Commonly known as: ACTOS       TAKE these medications    alendronate 70 MG tablet Commonly known as: FOSAMAX Take 70 mg by mouth See admin instructions. Take 70 mg weekly every Sunday   apixaban 5 MG Tabs tablet Commonly known as: ELIQUIS Take 1 tablet (5 mg total) by mouth 2 (two) times daily.   atorvastatin 80 MG tablet Commonly known as: LIPITOR Take 1 tablet (80 mg total) by mouth daily. Start taking on: March 31, 2021   clopidogrel 75 MG tablet Commonly known as: PLAVIX Take 1 tablet (75 mg total) by mouth daily. Start taking on: March 31, 2021   dabrafenib mesylate 50 MG capsule Commonly known as: Tafinlar TAKE 2 CAPSULES (100 MG TOTAL) BY MOUTH 2 (TWO) TIMES DAILY. TAKE ON AN EMPTY  STOMACH 1 HOUR BEFORE OR 2 HOURS AFTER MEALS.   escitalopram 10 MG tablet Commonly known as: LEXAPRO Take 10 mg by mouth daily.   glimepiride 1 MG tablet Commonly known as: AMARYL Take 1 tablet (1 mg total) by mouth daily with breakfast. What changed:  medication strength how much to take   ipratropium 0.02 % nebulizer solution Commonly known as: ATROVENT Take 2.5 mLs (0.5 mg total) by nebulization 2 (two) times daily.   levalbuterol 0.63 MG/3ML nebulizer solution Commonly known as: XOPENEX Take 3 mLs (0.63 mg total) by nebulization 2 (two) times daily.   metFORMIN 500 MG 24 hr tablet Commonly known as: GLUCOPHAGE-XR Take 1 tablet (500 mg total) by mouth 2 (two) times daily with a meal. Replaces: metFORMIN 500 MG tablet   metoprolol succinate 25 MG 24 hr tablet Commonly known as: TOPROL-XL Take 1 tablet (25 mg total) by mouth daily. Start taking on: March 31, 2021   nitroGLYCERIN 0.4 MG SL tablet Commonly known as: NITROSTAT Place 0.4 mg under the tongue every 5 (five) minutes as needed for chest pain.   ondansetron 8 MG tablet Commonly known as: ZOFRAN Take 1 tablet (8 mg total) by mouth every 8 (eight) hours as needed for nausea or vomiting. What changed: Another medication with the same name was added. Make sure you understand how and when to take each.   ondansetron 4 MG tablet Commonly known as: ZOFRAN  Take 1 tablet (4 mg total) by mouth every 6 (six) hours as needed for nausea. What changed: You were already taking a medication with the same name, and this prescription was added. Make sure you understand how and when to take each.   ONE TOUCH ULTRA TEST test strip Generic drug: glucose blood   polyethylene glycol 17 g packet Commonly known as: MIRALAX / GLYCOLAX Take 17 g by mouth daily as needed for mild constipation.   prochlorperazine 10 MG tablet Commonly known as: COMPAZINE Take 1 tablet (10 mg total) by mouth every 6 (six) hours as needed for  nausea or vomiting.   ranolazine 500 MG 12 hr tablet Commonly known as: RANEXA Take 1 tablet (500 mg total) by mouth 2 (two) times daily.   sacubitril-valsartan 24-26 MG Commonly known as: ENTRESTO Take 1 tablet by mouth 2 (two) times daily.   spironolactone 25 MG tablet Commonly known as: ALDACTONE Take 0.5 tablets (12.5 mg total) by mouth daily. Start taking on: March 31, 2021   tamsulosin 0.4 MG Caps capsule Commonly known as: FLOMAX Take 1 capsule (0.4 mg total) by mouth daily after supper.   trametinib dimethyl sulfoxide 0.5 MG tablet Commonly known as: Mekinist TAKE 2 TABLETS (1 MG TOTAL) BY MOUTH DAILY. TAKE 1 HOUR BEFORE OR 2 HOURS AFTER A MEAL. STORE REFRIGERATED IN ORIGINAL CONTAINER.   Vitamin D3 125 MCG (5000 UT) Caps Take 5,000 Units by mouth daily.        Discharge Assessment: Vitals:   03/30/21 0744 03/30/21 0813  BP:  133/82  Pulse:  92  Resp:    Temp:    SpO2: 96% 96%   Skin clean, dry and intact without evidence of skin break down, no evidence of skin tears noted. IV catheter discontinued intact. Site without signs and symptoms of complications - no redness or edema noted at insertion site, patient denies c/o pain - only slight tenderness at site.  Dressing with slight pressure applied.  D/c Instructions-Education: Discharge instructions given to patient/family with verbalized understanding. D/c education completed with patient/family including follow up instructions, medication list, d/c activities limitations if indicated, with other d/c instructions as indicated by MD - patient able to verbalize understanding, all questions fully answered. Patient instructed to return to ED, call 911, or call MD for any changes in condition.  Patient escorted via [place], and D/C home via private auto.  Zenaida Deed, RN 03/30/2021 1:23 PM

## 2021-03-30 NOTE — Progress Notes (Signed)
Palliative: Chart review completed.  [name] is to discharge to short-term rehab today.  Conference with attending, bedside nursing staff, transition of care team related to patient condition, needs, goals of care, disposition. [place] form completed.  Plan: Continue to treat the treatable but no CPR or intubation.  Outpatient palliative services with hospice of [facility].  No charge Quinn Axe, NP Palliative medicine team Team phone [phone] Greater than 50% of this time was spent counseling and coordinating care related to the above assessment and plan.

## 2021-03-31 DIAGNOSIS — I10 Essential (primary) hypertension: Secondary | ICD-10-CM | POA: Diagnosis not present

## 2021-03-31 DIAGNOSIS — E119 Type 2 diabetes mellitus without complications: Secondary | ICD-10-CM | POA: Diagnosis not present

## 2021-03-31 DIAGNOSIS — I4891 Unspecified atrial fibrillation: Secondary | ICD-10-CM | POA: Diagnosis not present

## 2021-03-31 DIAGNOSIS — I5022 Chronic systolic (congestive) heart failure: Secondary | ICD-10-CM | POA: Diagnosis not present

## 2021-03-31 DIAGNOSIS — D0220 Carcinoma in situ of unspecified bronchus and lung: Secondary | ICD-10-CM | POA: Diagnosis not present

## 2021-04-01 NOTE — Progress Notes (Signed)
Assessment: 1. Urinary retention     Plan: I reviewed the patient's hospital records including notes, lab results, and imaging results. CT imaging from 03/22/2021 was reviewed showing significant bladder distention with right hydronephrosis. Given his deconditioned state, I recommended that we continue the Foley catheter for now.  With therapy, he will regain strength and be more mobile.  This will increase his chances of successfully passing a voiding trial. Continue Foley catheter. Return to office in 2 weeks for voiding trial with morning visit. Continue tamsulosin.  Chief Complaint:  Chief Complaint  Patient presents with   Urinary Retention    History of Present Illness:  [name] is a 80 y.o. year old male who is seen in consultation from [name], MD for evaluation of urinary retention.  He was recently admitted to [facility] for a MI and sepsis.  He was found to be in urinary retention on CT imaging performed on 03/22/2021.  The CT showed massive distention of the urinary bladder with prominence of the right ureter and collecting system.  A Foley catheter was placed and the patient was started on tamsulosin.  He did have some gross hematuria following catheter placement.  This has resolved.  A voiding trial was attempted prior to discharge but was unsuccessful.  The Foley catheter was replaced.  He was discharged from the hospital to a rehab facility on 03/30/2021 with a Foley catheter in place.  He is currently undergoing physical therapy but remains deconditioned.  He is unable to ambulate or sit up for any extended period of time.  His catheter has been draining well.  The urine has been clear.  No prior history of urinary retention.   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   LIMA to LAD, SVG to D1, SVG to  OM2, SVG to RCA, open vein harvest right thigh and lower leg   Spontaneous pneumothorax 09/15/2010   right    Past Surgical History:  Past Surgical History:  Procedure Laterality Date   ANKLE FRACTURE SURGERY  2008   [facility]   APPENDECTOMY  [phone]   CHEST TUBE INSERTION Right 09/15/2010   Dr Servando Snare - spontaneous PTX   CHOLECYSTECTOMY  2008   COLONOSCOPY N/A 09/07/2017   Procedure: COLONOSCOPY;  Surgeon: Daneil Dolin, MD;  [facility];  Service: Endoscopy;  Laterality: N/A;  10:30am   CORONARY ARTERY BYPASS GRAFT  2003   EYE SURGERY  2001   Cataracts removed bilaterally   INCISIONAL HERNIA REPAIR N/A 01/29/2015   Procedure: Fatima Blank HERNIORRHAPHY WITH MESH;  Surgeon: Aviva Signs, MD;  Location: [facility] OR[facility];  Service: General;  Laterality: N/A;   INSERTION OF MESH N/A 01/29/2015   Procedure: INSERTION OF MESH;  Surgeon: Aviva Signs, MD;  Location: [facility];  Service: General;  Laterality: N/A;   [place] CATH AND CORS/GRAFTS ANGIOGRAPHY N/A 06/20/2017   Procedure: LEFT HEART CATH AND CORS/GRAFTS ANGIOGRAPHY;  Surgeon: Martinique, Peter M, MD;  Location: [facility];  Service: Cardiovascular;  Laterality: N/A;   ORIF ANKLE FRACTURE  08/26/2011   Procedure: OPEN REDUCTION INTERNAL FIXATION (ORIF) ANKLE FRACTURE;  Surgeon: Sanjuana Kava, MD;  Location: [facility];  Service:  Orthopedics;  Laterality: Right;   POLYPECTOMY  09/07/2017   Procedure: POLYPECTOMY;  Surgeon: Daneil Dolin, MD;  [facility];  Service: Endoscopy;;  ascending x2 (cold snare)   PROSTATE SURGERY  2012   Enlarged prostate   TEE WITHOUT CARDIOVERSION N/A 03/27/2021   Procedure: TRANSESOPHAGEAL ECHOCARDIOGRAM (TEE);  Surgeon: Arnoldo Lenis, MD;  Location: [facility];  Service: Endoscopy;  Laterality: N/A;   TRANSURETHRAL RESECTION OF PROSTATE  2012    Allergies:  Allergies  Allergen Reactions   Oxycodone      Family History:  Family History  Problem Relation Age of Onset   Aneurysm Mother    Heart attack Father    Colon cancer Maternal Uncle    Cancer Brother    Gastric cancer Neg Hx    Esophageal cancer Neg Hx     Social History:  Social History   Tobacco Use   Smoking status: Former    Packs/day: 1.00    Years: 33.00    Pack years: 33.00    Types: Cigarettes    Quit date: 05/17/1987    Years since quitting: 33.9   Smokeless tobacco: Never  Vaping Use   Vaping Use: Never used  Substance Use Topics   Alcohol use: No   Drug use: No    Review of symptoms:  Constitutional:  Negative for unexplained weight loss, night sweats, fever, chills ENT:  Negative for nose bleeds, sinus pain, painful swallowing CV:  Negative for chest pain, shortness of breath, exercise intolerance, palpitations, loss of consciousness Resp:  Negative for cough, wheezing, shortness of breath GI:  Negative for nausea, vomiting, diarrhea, bloody stools GU:  Positives noted in HPI; otherwise negative for gross hematuria, dysuria, urinary incontinence Neuro:  Negative for seizures, poor balance, limb weakness, slurred speech Psych:  Negative for lack of energy, depression, anxiety Endocrine:  Negative for polydipsia, polyuria, symptoms of hypoglycemia (dizziness, hunger, sweating) Hematologic:  Negative for anemia, purpura, petechia, prolonged or excessive bleeding, use of anticoagulants  Allergic:  Negative for difficulty breathing or choking as a result of exposure to anything; no shellfish allergy; no allergic response (rash/itch) to materials, foods  Physical exam: BP 102/66   Pulse 95  GENERAL APPEARANCE:  Well appearing, well developed, well nourished, NAD HEENT: Atraumatic, Normocephalic, oropharynx clear. NECK: Supple without lymphadenopathy or thyromegaly. LUNGS: Clear to auscultation bilaterally. HEART: Regular Rate and Rhythm without murmurs, gallops, or rubs. ABDOMEN: Soft, non-tender, No  Masses. EXTREMITIES: Moves all extremities well.  Without clubbing, cyanosis, or edema. NEUROLOGIC:  Alert and oriented x 3, in wheelchair, CN II-XII grossly intact.  MENTAL STATUS:  Appropriate. BACK:  Non-tender to palpation.  No CVAT SKIN:  Warm, dry and intact.   GU:  foley draining clear urine  Results: None

## 2021-04-02 ENCOUNTER — Ambulatory Visit: Payer: Medicare Other | Admitting: Urology

## 2021-04-02 ENCOUNTER — Encounter: Payer: Self-pay | Admitting: *Deleted

## 2021-04-02 ENCOUNTER — Other Ambulatory Visit: Payer: Self-pay

## 2021-04-02 ENCOUNTER — Encounter: Payer: Self-pay | Admitting: Urology

## 2021-04-02 VITALS — BP 102/66 | HR 95

## 2021-04-02 DIAGNOSIS — R339 Retention of urine, unspecified: Secondary | ICD-10-CM | POA: Diagnosis not present

## 2021-04-02 NOTE — Progress Notes (Signed)
Urological Symptom Review  Patient is experiencing the following symptoms: Patient has catheter   Review of Systems  Gastrointestinal (upper)  : Negative for upper GI symptoms  Gastrointestinal (lower) : Negative for lower GI symptoms  Constitutional : Negative for symptoms  Skin: Negative for skin symptoms  Eyes: Negative for eye symptoms  Ear/Nose/Throat : negative  Hematologic/Lymphatic: Negative for Hematologic/Lymphatic symptoms  Cardiovascular : Negative for cardiovascular symptoms  Respiratory : Negative for respiratory symptoms  Endocrine: Negative for endocrine symptoms  Musculoskeletal: Negative for musculoskeletal symptoms  Neurological: Negative for neurological symptoms  Psychologic: Negative for psychiatric symptoms

## 2021-04-05 NOTE — Progress Notes (Deleted)
Cardiology Office Note:    Date:  04/05/2021   ID:  [name], [name] 12/18/1940, MRN [id number]  PCP:  [name], MD   [facility] Providers Cardiologist:  Carlyle Dolly, MD {    Referring MD: [name], MD    History of Present Illness:    [name] is a 80 y.o. male with a hx of CAD s/p 2008 CABG with multi-vessel graft disease referred for redo CABG in 2019, HTN with DM, metastatic lung cancer on dabrafenib and tametinib, and normocytic anemia who had recent admission for inferior STEMI managed medically, PNA and bacteremia who now presents to clinic for follow-up from recent admission.  Patient was hospitalized from 03/20/21-03/30/21 after presenting to the ER with chest pain found to have inferior STEMI. Patient declined cath or transfer to [facility] hospital. Course was also complicated by Afib with RVR and bacteremia with TEE negative for endocarditis. He now presents to clinic for follow-up.  Past Medical History:  Diagnosis Date   Cancer ([place])    Chronic kidney disease    Coronary artery disease    Diabetes mellitus    Hypertension    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   LIMA to LAD, SVG to D1, SVG to OM2, SVG to RCA, open vein harvest right thigh and lower leg   Spontaneous pneumothorax 09/15/2010   right    Past Surgical History:  Procedure Laterality Date   ANKLE FRACTURE SURGERY  2008   [facility]   APPENDECTOMY  [phone]   CHEST TUBE INSERTION Right 09/15/2010   Dr Servando Snare - spontaneous PTX   CHOLECYSTECTOMY  2008   COLONOSCOPY N/A 09/07/2017   Procedure: COLONOSCOPY;  Surgeon: Daneil Dolin, MD;  [facility];  Service: Endoscopy;  Laterality: N/A;  10:30am   CORONARY ARTERY BYPASS GRAFT  2003   EYE SURGERY  2001   Cataracts removed bilaterally   INCISIONAL HERNIA REPAIR N/A 01/29/2015   Procedure: Fatima Blank HERNIORRHAPHY WITH MESH;  Surgeon: Aviva Signs, MD;  Location: [facility] ORS;  Service: General;  Laterality: N/A;    INSERTION OF MESH N/A 01/29/2015   Procedure: INSERTION OF MESH;  Surgeon: Aviva Signs, MD;  Location: [facility] ORS;  Service: General;  Laterality: N/A;   [place] CATH AND CORS/GRAFTS ANGIOGRAPHY N/A 06/20/2017   Procedure: LEFT HEART CATH AND CORS/GRAFTS ANGIOGRAPHY;  Surgeon: Martinique, Peter M, MD;  Location: [place] CV LAB;  Service: Cardiovascular;  Laterality: N/A;   ORIF ANKLE FRACTURE  08/26/2011   Procedure: OPEN REDUCTION INTERNAL FIXATION (ORIF) ANKLE FRACTURE;  Surgeon: Sanjuana Kava, MD;  Location: [facility] ORS;  Service: Orthopedics;  Laterality: Right;   POLYPECTOMY  09/07/2017   Procedure: POLYPECTOMY;  Surgeon: Daneil Dolin, MD;  [facility];  Service: Endoscopy;;  ascending x2 (cold snare)   PROSTATE SURGERY  2012   Enlarged prostate   TEE WITHOUT CARDIOVERSION N/A 03/27/2021   Procedure: TRANSESOPHAGEAL ECHOCARDIOGRAM (TEE);  Surgeon: Arnoldo Lenis, MD;  Location: [facility] ORS;  Service: Endoscopy;  Laterality: N/A;   TRANSURETHRAL RESECTION OF PROSTATE  2012    Current Medications: No outpatient medications have been marked as taking for the 04/13/21 encounter (Appointment) with Freada Bergeron, MD.     Allergies:   Oxycodone   Social History   Socioeconomic History   Marital status: Married    Spouse name: Not on file   Number of children: Not  on file   Years of education: Not on file   Highest education level: Not on file  Occupational History   Not on file  Tobacco Use   Smoking status: Former    Packs/day: 1.00    Years: 33.00    Pack years: 33.00    Types: Cigarettes    Quit date: 05/17/1987    Years since quitting: 33.9   Smokeless tobacco: Never  Vaping Use   Vaping Use: Never used  Substance and Sexual Activity   Alcohol use: No   Drug use: No   Sexual activity: Not Currently  Other Topics Concern   Not on file  Social History Narrative   Not on file   Social Determinants of Health    Financial Resource Strain: Low Risk    Difficulty of Paying Living Expenses: Not very hard  Food Insecurity: No Food Insecurity   Worried About Running Out of Food in the Last Year: Never true   Ran Out of Food in the Last Year: Never true  Transportation Needs: No Transportation Needs   Lack of Transportation (Medical): No   Lack of Transportation (Non-Medical): No  Physical Activity: Inactive   Days of Exercise per Week: 0 days   Minutes of Exercise per Session: 0 min  Stress: No Stress Concern Present   Feeling of Stress : Not at all  Social Connections: Socially Integrated   Frequency of Communication with Friends and Family: More than three times a week   Frequency of Social Gatherings with Friends and Family: Three times a week   Attends Religious Services: 1 to 4 times per year   Active Member of Clubs or Organizations: No   Attends Music therapist: 1 to 4 times per year   Marital Status: Married     Family History: The patient's ***family history includes Aneurysm in his mother; Cancer in his brother; Colon cancer in his maternal uncle; Heart attack in his father. There is no history of Gastric cancer or Esophageal cancer.  ROS:   Please see the history of present illness.    *** All other systems reviewed and are negative.  EKGs/Labs/Other Studies Reviewed:    The following studies were reviewed today: Transthoracic Echocardiogram: Date: 03/21/21 Results: Ectopy and LAD disease, possible RCA disease as well 1. Distal septal, apical mid and basal inferior wall hypokinesis . Left  ventricular ejection fraction, by estimation, is 35 to 40%. The left  ventricle has moderately decreased function. The left ventricle  demonstrates regional wall motion abnormalities  (see scoring diagram/findings for description). Left ventricular diastolic  parameters were normal.   2. Right ventricular systolic function is normal. The right ventricular  size is normal.  There is moderately elevated pulmonary artery systolic  pressure.   3. Left atrial size was moderately dilated.   4. 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 The mitral valve is abnormal. Mild mitral valve  regurgitation. No evidence of mitral stenosis.   5. The aortic valve is tricuspid. There is mild calcification of the  aortic valve. Aortic valve regurgitation is not visualized. Mild aortic  valve sclerosis is present, with no evidence of aortic valve stenosis.   6. The inferior vena cava is normal in size with greater than 50%  respiratory variability, suggesting right atrial pressure of 3 mmHg.    Left/Right Heart Catheterizations: Date: 06/20/2017 Results: Prox LAD to Mid LAD lesion is 100% stenosed. Ost 1st Diag lesion is 90% stenosed. Ost Cx to Prox Cx lesion is 100%  stenosed. Prox RCA to Dist RCA lesion is 100% stenosed. LIMA graft was visualized by angiography and is normal in caliber. The graft exhibits no disease. Origin to Prox Graft lesion is 80% stenosed. Prox Graft to Mid Graft lesion is 95% stenosed. Dist Graft lesion is 90% stenosed. Prox Graft to Mid Graft lesion is 90% stenosed. Dist Graft lesion is 90% stenosed. Origin to Prox Graft lesion is 99% stenosed. LV end diastolic pressure is normal.   1. Severe 3 vessel obstructive CAD 2. Patent LIMA to the LAD 3. Severe SVG disease:     99% proximal SVG to the diagonal with TIMI 1 flow     Multiple high grade lesions in SVG to OM     Multiple high grade lesions in SVG to RCA 4. Normal LVEDP.   Plan: Recommend consideration for redo CABG. PCI is of limited benefit in this setting with multiple vein graft lesions in old SVGs. Will check Echo to assess LV function.    EKG:  EKG is *** ordered today.  The ekg ordered today demonstrates ***  Recent Labs: 01/09/2021: TSH 1.320 03/23/2021: ALT 11 03/28/2021: Magnesium 1.7 03/29/2021: BUN 7; Creatinine, Ser 1.13; Hemoglobin 9.2; Platelets 205; Potassium 3.6; Sodium 133  Recent Lipid  Panel No results found for: CHOL, TRIG, HDL, CHOLHDL, VLDL, LDLCALC, LDLDIRECT   Risk Assessment/Calculations:   {Does this patient have ATRIAL FIBRILLATION?:[phone]}       Physical Exam:    VS:  There were no vitals taken for this visit.    Wt Readings from Last 3 Encounters:  03/30/21 186 lb 1.1 oz (84.4 kg)  01/15/21 174 lb 14.4 oz (79.3 kg)  09/15/20 182 lb 4.8 oz (82.7 kg)     GEN: *** Well nourished, well developed in no acute distress HEENT: Normal NECK: No JVD; No carotid bruits LYMPHATICS: No lymphadenopathy CARDIAC: ***RRR, no murmurs, rubs, gallops RESPIRATORY:  Clear to auscultation without rales, wheezing or rhonchi  ABDOMEN: Soft, non-tender, non-distended MUSCULOSKELETAL:  No edema; No deformity  SKIN: Warm and dry NEUROLOGIC:  Alert and oriented x 3 PSYCHIATRIC:  Normal affect   ASSESSMENT:    No diagnosis found. PLAN:    In order of problems listed above:  #CAD s/p CABG in 2008: #Inferior STEMI Managed Medically: Has history of CABG in 2008. Cath 2019 with severe native 3 vessel disease, patent LIMA-LAD, 99% SVG-Diag, multilple high grade stenosis SVG-OM and SVG-RCA. From cath note, PCI of limited benefit given multiple vein graft lesions in old SVGs. Patient was evaluated for redo CABG however turned down due to metastatic cancer history. [facility] to [facility] with chest pain found to have inferior STEMI. Trop up to 8300; TTE with EF 35-40%, inferior hypokinesis, normal RV, mild MR. Declined transfer to [facility] or cath at that time and therefore was managed medically.  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 Currently *** -Continue metop 25mg  XL daily -Continue losartan 12.5mg  daily -Continue spiro 12.5mg  daily -?entresto -? SGLT2i -Monitor  daily weights -Low Na diet  #Afib: CHADs-vasc 6.New diagnosis in the setting of STEMI, PNA and bacteremia. -Continue metop -Continue apixaban 5mg  BID  #Metastatic Lung Cancer:  #GOC: -DNR/DNI      {Are you ordering a CV Procedure (e.g. stress test, cath, DCCV, TEE, etc)?   Press F2        :  [id number]}    Medication Adjustments/Labs and Tests Ordered: Current medicines are reviewed at length with the patient today.  Concerns regarding medicines are outlined above.  No orders of the defined types were placed in this encounter.  No orders of the defined types were placed in this encounter.   There are no Patient Instructions on file for this visit.   Signed, Freada Bergeron, MD  04/05/2021 12:59 PM    [place]

## 2021-04-10 DIAGNOSIS — R918 Other nonspecific abnormal finding of lung field: Secondary | ICD-10-CM | POA: Diagnosis not present

## 2021-04-10 DIAGNOSIS — C3490 Malignant neoplasm of unspecified part of unspecified bronchus or lung: Secondary | ICD-10-CM | POA: Diagnosis not present

## 2021-04-10 DIAGNOSIS — J9811 Atelectasis: Secondary | ICD-10-CM | POA: Diagnosis not present

## 2021-04-10 DIAGNOSIS — I2510 Atherosclerotic heart disease of native coronary artery without angina pectoris: Secondary | ICD-10-CM | POA: Diagnosis not present

## 2021-04-10 DIAGNOSIS — Z7902 Long term (current) use of antithrombotics/antiplatelets: Secondary | ICD-10-CM | POA: Diagnosis not present

## 2021-04-10 DIAGNOSIS — M899 Disorder of bone, unspecified: Secondary | ICD-10-CM | POA: Diagnosis not present

## 2021-04-10 DIAGNOSIS — J439 Emphysema, unspecified: Secondary | ICD-10-CM | POA: Diagnosis not present

## 2021-04-10 DIAGNOSIS — I700 Atherosclerosis of aorta: Secondary | ICD-10-CM | POA: Diagnosis not present

## 2021-04-10 DIAGNOSIS — Z7901 Long term (current) use of anticoagulants: Secondary | ICD-10-CM | POA: Diagnosis not present

## 2021-04-10 DIAGNOSIS — E871 Hypo-osmolality and hyponatremia: Secondary | ICD-10-CM | POA: Diagnosis not present

## 2021-04-10 DIAGNOSIS — R079 Chest pain, unspecified: Secondary | ICD-10-CM | POA: Diagnosis not present

## 2021-04-10 DIAGNOSIS — R042 Hemoptysis: Secondary | ICD-10-CM | POA: Diagnosis not present

## 2021-04-10 DIAGNOSIS — R9431 Abnormal electrocardiogram [ECG] [EKG]: Secondary | ICD-10-CM | POA: Diagnosis not present

## 2021-04-10 DIAGNOSIS — J90 Pleural effusion, not elsewhere classified: Secondary | ICD-10-CM | POA: Diagnosis not present

## 2021-04-13 ENCOUNTER — Ambulatory Visit: Payer: Medicare Other | Admitting: Urology

## 2021-04-13 ENCOUNTER — Ambulatory Visit: Payer: Medicare Other | Admitting: Cardiology

## 2021-04-13 NOTE — Progress Notes (Deleted)
Assessment: 1. Urinary retention      Plan: I reviewed the patient's hospital records including notes, lab results, and imaging results. CT imaging from 03/22/2021 was reviewed showing significant bladder distention with right hydronephrosis. Given his deconditioned state, I recommended that we continue the Foley catheter for now.  With therapy, he will regain strength and be more mobile.  This will increase his chances of successfully passing a voiding trial. Continue Foley catheter. Return to office in 2 weeks for voiding trial with morning visit. Continue tamsulosin.  Chief Complaint:  No chief complaint on file.   History of Present Illness:  [name] is a 80 y.o. year old male who is seen for further evaluation of urinary retention.  He was recently admitted to [facility] for a MI and sepsis.  He was found to be in urinary retention on CT imaging performed on 03/22/2021.  The CT showed massive distention of the urinary bladder with prominence of the right ureter and collecting system.  A Foley catheter was placed and the patient was started on tamsulosin.  He did have some gross hematuria following catheter placement which resolved.  A voiding trial was attempted prior to discharge but was unsuccessful.  The Foley catheter was replaced.  He was discharged from the hospital to a rehab facility on 03/30/2021 with a Foley catheter.  He was admitted to rehab facility for physical therapy but remained deconditioned.  At the time of his visit on 04/02/21, he was unable to ambulate or sit up for any extended period of time.   His catheter has been draining well.  The urine has been clear.  No prior history of urinary retention.  Portions of the above documentation were copied from a prior visit for review purposes only.   Past Medical History:  Past Medical History:  Diagnosis Date   Cancer ([place])    Chronic kidney disease    Coronary artery disease    Diabetes mellitus     Hypertension    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   LIMA to LAD, SVG to D1, SVG to OM2, SVG to RCA, open vein harvest right thigh and lower leg   Spontaneous pneumothorax 09/15/2010   right    Past Surgical History:  Past Surgical History:  Procedure Laterality Date   ANKLE FRACTURE SURGERY  2008   [facility]   APPENDECTOMY  [phone]   CHEST TUBE INSERTION Right 09/15/2010   Dr Servando Snare - spontaneous PTX   CHOLECYSTECTOMY  2008   COLONOSCOPY N/A 09/07/2017   Procedure: COLONOSCOPY;  Surgeon: Daneil Dolin, MD;  [facility];  Service: Endoscopy;  Laterality: N/A;  10:30am   CORONARY ARTERY BYPASS GRAFT  2003   EYE SURGERY  2001   Cataracts removed bilaterally   INCISIONAL HERNIA REPAIR N/A 01/29/2015   Procedure: Fatima Blank HERNIORRHAPHY WITH MESH;  Surgeon: Aviva Signs, MD;  Location: [facility] ORS;  Service: General;  Laterality: N/A;   INSERTION OF MESH N/A 01/29/2015   Procedure: INSERTION OF MESH;  Surgeon: Aviva Signs, MD;  Location: [facility] ORS;  Service: General;  Laterality: N/A;   [place] CATH AND CORS/GRAFTS ANGIOGRAPHY N/A 06/20/2017   Procedure: LEFT HEART CATH AND CORS/GRAFTS ANGIOGRAPHY;  Surgeon: Martinique, Peter M, MD;  Location: [place] CV LAB;  Service: Cardiovascular;  Laterality: N/A;   ORIF ANKLE FRACTURE  08/26/2011   Procedure: OPEN REDUCTION INTERNAL FIXATION (  ORIF) ANKLE FRACTURE;  Surgeon: Sanjuana Kava, MD;  Location: [facility];  Service: Orthopedics;  Laterality: Right;   POLYPECTOMY  09/07/2017   Procedure: POLYPECTOMY;  Surgeon: Daneil Dolin, MD;  [facility];  Service: Endoscopy;;  ascending x2 (cold snare)   PROSTATE SURGERY  2012   Enlarged prostate   TEE WITHOUT CARDIOVERSION N/A 03/27/2021   Procedure: TRANSESOPHAGEAL ECHOCARDIOGRAM (TEE);  Surgeon: Arnoldo Lenis, MD;  Location: [facility];  Service: Endoscopy;  Laterality: N/A;   TRANSURETHRAL RESECTION OF PROSTATE   2012    Allergies:  Allergies  Allergen Reactions   Oxycodone     Family History:  Family History  Problem Relation Age of Onset   Aneurysm Mother    Heart attack Father    Colon cancer Maternal Uncle    Cancer Brother    Gastric cancer Neg Hx    Esophageal cancer Neg Hx     Social History:  Social History   Tobacco Use   Smoking status: Former    Packs/day: 1.00    Years: 33.00    Pack years: 33.00    Types: Cigarettes    Quit date: 05/17/1987    Years since quitting: 33.9   Smokeless tobacco: Never  Vaping Use   Vaping Use: Never used  Substance Use Topics   Alcohol use: No   Drug use: No    ROS: Constitutional:  Negative for fever, chills, weight loss CV: Negative for chest pain, previous MI, hypertension Respiratory:  Negative for shortness of breath, wheezing, sleep apnea, frequent cough GI:  Negative for nausea, vomiting, bloody stool, GERD  Physical exam: There were no vitals taken for this visit. ***   Results: ***  Voiding trial: Volume instilled: Volume voided:

## 2021-04-16 ENCOUNTER — Inpatient Hospital Stay (HOSPITAL_COMMUNITY): Payer: Medicare Other

## 2021-04-16 ENCOUNTER — Ambulatory Visit (HOSPITAL_COMMUNITY): Payer: Medicare Other

## 2021-04-16 DIAGNOSIS — C3490 Malignant neoplasm of unspecified part of unspecified bronchus or lung: Secondary | ICD-10-CM | POA: Diagnosis not present

## 2021-04-16 DIAGNOSIS — J9601 Acute respiratory failure with hypoxia: Secondary | ICD-10-CM | POA: Diagnosis not present

## 2021-04-16 DIAGNOSIS — I213 ST elevation (STEMI) myocardial infarction of unspecified site: Secondary | ICD-10-CM | POA: Diagnosis not present

## 2021-04-16 DIAGNOSIS — I5022 Chronic systolic (congestive) heart failure: Secondary | ICD-10-CM | POA: Diagnosis not present

## 2021-04-20 ENCOUNTER — Ambulatory Visit: Payer: Medicare Other

## 2021-04-22 ENCOUNTER — Encounter: Payer: Self-pay | Admitting: *Deleted

## 2021-04-22 ENCOUNTER — Telehealth: Payer: Self-pay | Admitting: Cardiology

## 2021-04-22 DIAGNOSIS — M6281 Muscle weakness (generalized): Secondary | ICD-10-CM | POA: Diagnosis not present

## 2021-04-22 DIAGNOSIS — R2689 Other abnormalities of gait and mobility: Secondary | ICD-10-CM | POA: Diagnosis not present

## 2021-04-22 DIAGNOSIS — I213 ST elevation (STEMI) myocardial infarction of unspecified site: Secondary | ICD-10-CM | POA: Diagnosis not present

## 2021-04-22 DIAGNOSIS — R339 Retention of urine, unspecified: Secondary | ICD-10-CM | POA: Diagnosis not present

## 2021-04-22 DIAGNOSIS — N183 Chronic kidney disease, stage 3 unspecified: Secondary | ICD-10-CM | POA: Diagnosis not present

## 2021-04-22 DIAGNOSIS — E1122 Type 2 diabetes mellitus with diabetic chronic kidney disease: Secondary | ICD-10-CM | POA: Diagnosis not present

## 2021-04-22 DIAGNOSIS — J9601 Acute respiratory failure with hypoxia: Secondary | ICD-10-CM | POA: Diagnosis not present

## 2021-04-22 DIAGNOSIS — I5022 Chronic systolic (congestive) heart failure: Secondary | ICD-10-CM | POA: Diagnosis not present

## 2021-04-22 DIAGNOSIS — E1159 Type 2 diabetes mellitus with other circulatory complications: Secondary | ICD-10-CM | POA: Diagnosis not present

## 2021-04-22 DIAGNOSIS — I129 Hypertensive chronic kidney disease with stage 1 through stage 4 chronic kidney disease, or unspecified chronic kidney disease: Secondary | ICD-10-CM | POA: Diagnosis not present

## 2021-04-22 NOTE — Telephone Encounter (Signed)
Spoke to [place] (PT) who asked to go over pt's medication list. Erline Levine stated that she was unsure if pt was supposed to be taking Entresto, Spironolactone, and Plavix. Pt was ordered these by Irwin Brakeman, MD at his ED evaluation on 03/20/2021. Erline Levine also stated that pt has been c/o headaches and dizziness with low bp's.  Erline Levine reported taking bp today: Resting- 110/50 Sitting- 104/50 Sitting (6-38m)- 114/50   Pt reported that he cannot sit more that 7 minutes without feeling dizzy/sick.  Pt has appt scheduled w/ M. Lenze on 04/27/21.

## 2021-04-22 NOTE — Telephone Encounter (Signed)
Erline Levine, physical therapist, states the patient was discharged from the hospital and finished from rehab. She states there are multiple medications listed for the patient to be taking, but they are not in his house and there are no orders for them. She states she needs to know what medications he needs to be taking, because some of the medications listed are very important. She states she is currently in the patient's home and would like a call back as soon as possible, while she is there. Phone: [phone]

## 2021-04-22 NOTE — Telephone Encounter (Signed)
Pt's wife stated that pt is currently taking all medications prescribed. Verbalized understanding of holding Spironolactone 12.5 mg tablets until pt is seen by Gerrianne Scale, PA-C on 12/12. Agreeable to following hr/bp until appt. Wife aware to call [place] if she has any other questions or concerns.

## 2021-04-22 NOTE — Telephone Encounter (Signed)
    In reviewing his discharge summary, from a cardiac perspective he should be on Eliquis 5mg  BID, Atorvastatin 80mg  daily, Plavix 75mg  daily, Toprol-XL 25mg  daily, Ranexa 500mg  BID, Entresto 24-26mg  BID and Spironolactone 12.5mg  daily.   Does she know what he has been taking? Did he start all of these? If he is in fact taking these and having the soft BP's, would initially hold Spironolactone. Continue to follow HR and BP and bring a log of readings to his follow-up appointment.   Signed, Erma Heritage, PA-C 04/22/2021, 1:00 PM

## 2021-04-22 NOTE — Telephone Encounter (Signed)
error 

## 2021-04-23 ENCOUNTER — Ambulatory Visit (HOSPITAL_COMMUNITY): Payer: Medicare Other | Admitting: Hematology

## 2021-04-24 DIAGNOSIS — R2689 Other abnormalities of gait and mobility: Secondary | ICD-10-CM | POA: Diagnosis not present

## 2021-04-24 DIAGNOSIS — I5022 Chronic systolic (congestive) heart failure: Secondary | ICD-10-CM | POA: Diagnosis not present

## 2021-04-24 DIAGNOSIS — R339 Retention of urine, unspecified: Secondary | ICD-10-CM | POA: Diagnosis not present

## 2021-04-24 DIAGNOSIS — E1122 Type 2 diabetes mellitus with diabetic chronic kidney disease: Secondary | ICD-10-CM | POA: Diagnosis not present

## 2021-04-24 DIAGNOSIS — I213 ST elevation (STEMI) myocardial infarction of unspecified site: Secondary | ICD-10-CM | POA: Diagnosis not present

## 2021-04-24 DIAGNOSIS — N183 Chronic kidney disease, stage 3 unspecified: Secondary | ICD-10-CM | POA: Diagnosis not present

## 2021-04-24 DIAGNOSIS — E1159 Type 2 diabetes mellitus with other circulatory complications: Secondary | ICD-10-CM | POA: Diagnosis not present

## 2021-04-24 DIAGNOSIS — M6281 Muscle weakness (generalized): Secondary | ICD-10-CM | POA: Diagnosis not present

## 2021-04-24 DIAGNOSIS — I129 Hypertensive chronic kidney disease with stage 1 through stage 4 chronic kidney disease, or unspecified chronic kidney disease: Secondary | ICD-10-CM | POA: Diagnosis not present

## 2021-04-24 DIAGNOSIS — J9601 Acute respiratory failure with hypoxia: Secondary | ICD-10-CM | POA: Diagnosis not present

## 2021-04-27 ENCOUNTER — Emergency Department (HOSPITAL_COMMUNITY): Payer: Medicare Other

## 2021-04-27 ENCOUNTER — Other Ambulatory Visit: Payer: Self-pay

## 2021-04-27 ENCOUNTER — Encounter: Payer: Self-pay | Admitting: Physician Assistant

## 2021-04-27 ENCOUNTER — Ambulatory Visit: Payer: Medicare Other | Admitting: Physician Assistant

## 2021-04-27 ENCOUNTER — Encounter (HOSPITAL_COMMUNITY): Payer: Self-pay | Admitting: *Deleted

## 2021-04-27 ENCOUNTER — Inpatient Hospital Stay (HOSPITAL_COMMUNITY)
Admission: EM | Admit: 2021-04-27 | Discharge: 2021-04-29 | DRG: 698 | Disposition: A | Discharge status: Home Health | Payer: Medicare Other | Attending: Internal Medicine | Admitting: Internal Medicine

## 2021-04-27 ENCOUNTER — Observation Stay (HOSPITAL_COMMUNITY): Payer: Medicare Other

## 2021-04-27 VITALS — BP 132/70 | HR 78 | Ht 71.0 in | Wt 173.0 lb

## 2021-04-27 DIAGNOSIS — T83518A Infection and inflammatory reaction due to other urinary catheter, initial encounter: Secondary | ICD-10-CM | POA: Diagnosis not present

## 2021-04-27 DIAGNOSIS — Z66 Do not resuscitate: Secondary | ICD-10-CM | POA: Diagnosis present

## 2021-04-27 DIAGNOSIS — Y846 Urinary catheterization as the cause of abnormal reaction of the patient, or of later complication, without mention of misadventure at the time of the procedure: Secondary | ICD-10-CM | POA: Diagnosis present

## 2021-04-27 DIAGNOSIS — E871 Hypo-osmolality and hyponatremia: Secondary | ICD-10-CM | POA: Diagnosis present

## 2021-04-27 DIAGNOSIS — N1831 Chronic kidney disease, stage 3a: Secondary | ICD-10-CM | POA: Diagnosis not present

## 2021-04-27 DIAGNOSIS — R109 Unspecified abdominal pain: Secondary | ICD-10-CM | POA: Diagnosis not present

## 2021-04-27 DIAGNOSIS — Z85118 Personal history of other malignant neoplasm of bronchus and lung: Secondary | ICD-10-CM | POA: Diagnosis not present

## 2021-04-27 DIAGNOSIS — I5031 Acute diastolic (congestive) heart failure: Secondary | ICD-10-CM | POA: Diagnosis not present

## 2021-04-27 DIAGNOSIS — Z800 Family history of malignant neoplasm of digestive organs: Secondary | ICD-10-CM | POA: Diagnosis not present

## 2021-04-27 DIAGNOSIS — R7881 Bacteremia: Secondary | ICD-10-CM | POA: Diagnosis not present

## 2021-04-27 DIAGNOSIS — Z794 Long term (current) use of insulin: Secondary | ICD-10-CM

## 2021-04-27 DIAGNOSIS — N390 Urinary tract infection, site not specified: Secondary | ICD-10-CM

## 2021-04-27 DIAGNOSIS — I252 Old myocardial infarction: Secondary | ICD-10-CM

## 2021-04-27 DIAGNOSIS — Z951 Presence of aortocoronary bypass graft: Secondary | ICD-10-CM | POA: Diagnosis not present

## 2021-04-27 DIAGNOSIS — E222 Syndrome of inappropriate secretion of antidiuretic hormone: Secondary | ICD-10-CM | POA: Diagnosis not present

## 2021-04-27 DIAGNOSIS — Z20822 Contact with and (suspected) exposure to covid-19: Secondary | ICD-10-CM | POA: Diagnosis not present

## 2021-04-27 DIAGNOSIS — Z79899 Other long term (current) drug therapy: Secondary | ICD-10-CM

## 2021-04-27 DIAGNOSIS — N179 Acute kidney failure, unspecified: Secondary | ICD-10-CM

## 2021-04-27 DIAGNOSIS — Z885 Allergy status to narcotic agent status: Secondary | ICD-10-CM

## 2021-04-27 DIAGNOSIS — Z7901 Long term (current) use of anticoagulants: Secondary | ICD-10-CM

## 2021-04-27 DIAGNOSIS — Z6821 Body mass index (BMI) 21.0-21.9, adult: Secondary | ICD-10-CM

## 2021-04-27 DIAGNOSIS — I2510 Atherosclerotic heart disease of native coronary artery without angina pectoris: Secondary | ICD-10-CM | POA: Diagnosis not present

## 2021-04-27 DIAGNOSIS — I6782 Cerebral ischemia: Secondary | ICD-10-CM | POA: Diagnosis not present

## 2021-04-27 DIAGNOSIS — I4891 Unspecified atrial fibrillation: Secondary | ICD-10-CM

## 2021-04-27 DIAGNOSIS — B952 Enterococcus as the cause of diseases classified elsewhere: Secondary | ICD-10-CM

## 2021-04-27 DIAGNOSIS — Z515 Encounter for palliative care: Secondary | ICD-10-CM | POA: Diagnosis not present

## 2021-04-27 DIAGNOSIS — D631 Anemia in chronic kidney disease: Secondary | ICD-10-CM | POA: Diagnosis not present

## 2021-04-27 DIAGNOSIS — Z8249 Family history of ischemic heart disease and other diseases of the circulatory system: Secondary | ICD-10-CM | POA: Diagnosis not present

## 2021-04-27 DIAGNOSIS — R531 Weakness: Secondary | ICD-10-CM | POA: Diagnosis not present

## 2021-04-27 DIAGNOSIS — T83511A Infection and inflammatory reaction due to indwelling urethral catheter, initial encounter: Secondary | ICD-10-CM | POA: Diagnosis not present

## 2021-04-27 DIAGNOSIS — Z7983 Long term (current) use of bisphosphonates: Secondary | ICD-10-CM

## 2021-04-27 DIAGNOSIS — D630 Anemia in neoplastic disease: Secondary | ICD-10-CM | POA: Diagnosis not present

## 2021-04-27 DIAGNOSIS — Z9049 Acquired absence of other specified parts of digestive tract: Secondary | ICD-10-CM

## 2021-04-27 DIAGNOSIS — Z9842 Cataract extraction status, left eye: Secondary | ICD-10-CM

## 2021-04-27 DIAGNOSIS — E1122 Type 2 diabetes mellitus with diabetic chronic kidney disease: Secondary | ICD-10-CM | POA: Diagnosis present

## 2021-04-27 DIAGNOSIS — Z7189 Other specified counseling: Secondary | ICD-10-CM | POA: Diagnosis not present

## 2021-04-27 DIAGNOSIS — R112 Nausea with vomiting, unspecified: Secondary | ICD-10-CM

## 2021-04-27 DIAGNOSIS — R42 Dizziness and giddiness: Secondary | ICD-10-CM | POA: Diagnosis not present

## 2021-04-27 DIAGNOSIS — G319 Degenerative disease of nervous system, unspecified: Secondary | ICD-10-CM | POA: Diagnosis not present

## 2021-04-27 DIAGNOSIS — C719 Malignant neoplasm of brain, unspecified: Secondary | ICD-10-CM | POA: Diagnosis not present

## 2021-04-27 DIAGNOSIS — E119 Type 2 diabetes mellitus without complications: Secondary | ICD-10-CM | POA: Diagnosis not present

## 2021-04-27 DIAGNOSIS — R9089 Other abnormal findings on diagnostic imaging of central nervous system: Secondary | ICD-10-CM | POA: Diagnosis not present

## 2021-04-27 DIAGNOSIS — Z9841 Cataract extraction status, right eye: Secondary | ICD-10-CM

## 2021-04-27 DIAGNOSIS — R1110 Vomiting, unspecified: Secondary | ICD-10-CM | POA: Diagnosis not present

## 2021-04-27 DIAGNOSIS — E860 Dehydration: Secondary | ICD-10-CM | POA: Diagnosis not present

## 2021-04-27 DIAGNOSIS — I255 Ischemic cardiomyopathy: Secondary | ICD-10-CM

## 2021-04-27 DIAGNOSIS — R319 Hematuria, unspecified: Secondary | ICD-10-CM

## 2021-04-27 DIAGNOSIS — I420 Dilated cardiomyopathy: Secondary | ICD-10-CM | POA: Diagnosis present

## 2021-04-27 DIAGNOSIS — M4726 Other spondylosis with radiculopathy, lumbar region: Secondary | ICD-10-CM | POA: Diagnosis not present

## 2021-04-27 DIAGNOSIS — I130 Hypertensive heart and chronic kidney disease with heart failure and stage 1 through stage 4 chronic kidney disease, or unspecified chronic kidney disease: Secondary | ICD-10-CM | POA: Diagnosis present

## 2021-04-27 DIAGNOSIS — R627 Adult failure to thrive: Secondary | ICD-10-CM | POA: Diagnosis not present

## 2021-04-27 DIAGNOSIS — M5116 Intervertebral disc disorders with radiculopathy, lumbar region: Secondary | ICD-10-CM | POA: Diagnosis not present

## 2021-04-27 DIAGNOSIS — Z87891 Personal history of nicotine dependence: Secondary | ICD-10-CM

## 2021-04-27 DIAGNOSIS — C3490 Malignant neoplasm of unspecified part of unspecified bronchus or lung: Secondary | ICD-10-CM | POA: Diagnosis not present

## 2021-04-27 DIAGNOSIS — I700 Atherosclerosis of aorta: Secondary | ICD-10-CM | POA: Diagnosis not present

## 2021-04-27 DIAGNOSIS — Z7984 Long term (current) use of oral hypoglycemic drugs: Secondary | ICD-10-CM

## 2021-04-27 DIAGNOSIS — Z7982 Long term (current) use of aspirin: Secondary | ICD-10-CM

## 2021-04-27 LAB — BASIC METABOLIC PANEL
Anion gap: 5 (ref 5–15)
BUN: 19 mg/dL (ref 8–23)
CO2: 26 mmol/L (ref 22–32)
Calcium: 7.9 mg/dL — ABNORMAL LOW (ref 8.9–10.3)
Chloride: 94 mmol/L — ABNORMAL LOW (ref 98–111)
Creatinine, Ser: 1.45 mg/dL — ABNORMAL HIGH (ref 0.61–1.24)
GFR, Estimated: 48 mL/min — ABNORMAL LOW (ref >60)
Glucose, Bld: 205 mg/dL — ABNORMAL HIGH (ref 70–99)
Potassium: 3.7 mmol/L (ref 3.5–5.1)
Sodium: 125 mmol/L — ABNORMAL LOW (ref 135–145)

## 2021-04-27 LAB — CBC
HCT: 34.3 % — ABNORMAL LOW (ref 39.0–52.0)
Hemoglobin: 11.4 g/dL — ABNORMAL LOW (ref 13.0–17.0)
MCH: 26.7 pg (ref 26.0–34.0)
MCHC: 33.2 g/dL (ref 30.0–36.0)
MCV: 80.3 fL (ref 80.0–100.0)
Platelets: 313 10*3/uL (ref 150–400)
RBC: 4.27 MIL/uL (ref 4.22–5.81)
RDW: 18.6 % — ABNORMAL HIGH (ref 11.5–15.5)
WBC: 14.9 10*3/uL — ABNORMAL HIGH (ref 4.0–10.5)
nRBC: 0 % (ref 0.0–0.2)

## 2021-04-27 LAB — URINALYSIS, ROUTINE W REFLEX MICROSCOPIC
Bilirubin Urine: NEGATIVE
Glucose, UA: >=500 mg/dL — AB
Ketones, ur: 20 mg/dL — AB
Nitrite: POSITIVE — AB
Protein, ur: 30 mg/dL — AB
Specific Gravity, Urine: 1.033 — ABNORMAL HIGH (ref 1.005–1.030)
WBC, UA: >50 WBC/hpf — ABNORMAL HIGH (ref 0–5)
pH: 6 (ref 5.0–8.0)

## 2021-04-27 LAB — RESP PANEL BY RT-PCR (FLU A&B, COVID) ARPGX2
Influenza A by PCR: NEGATIVE
Influenza B by PCR: NEGATIVE
SARS Coronavirus 2 by RT PCR: NEGATIVE

## 2021-04-27 LAB — TROPONIN I (HIGH SENSITIVITY)
Troponin I (High Sensitivity): 21 ng/L — ABNORMAL HIGH (ref <18)
Troponin I (High Sensitivity): 19 ng/L — ABNORMAL HIGH (ref <18)

## 2021-04-27 LAB — CBG MONITORING, ED: Glucose-Capillary: 184 mg/dL — ABNORMAL HIGH (ref 70–99)

## 2021-04-27 LAB — GLUCOSE, CAPILLARY: Glucose-Capillary: 138 mg/dL — ABNORMAL HIGH (ref 70–99)

## 2021-04-27 MED ORDER — METOPROLOL SUCCINATE ER 50 MG PO TB24
100.0000 mg | ORAL_TABLET | Freq: Every day | ORAL | Status: DC
  Administered 2021-04-28 – 2021-04-29 (×2): 100 mg via ORAL
  Filled 2021-04-27 (×2): qty 2

## 2021-04-27 MED ORDER — ONDANSETRON HCL 4 MG PO TABS: 4.0000 mg | ORAL_TABLET | Freq: Four times a day (QID) | ORAL | Status: DC | PRN

## 2021-04-27 MED ORDER — ONDANSETRON HCL 4 MG/2ML IJ SOLN
4.0000 mg | Freq: Four times a day (QID) | INTRAMUSCULAR | Status: DC | PRN
  Administered 2021-04-28: 4 mg via INTRAVENOUS
  Filled 2021-04-27: qty 2

## 2021-04-27 MED ORDER — ATORVASTATIN CALCIUM 40 MG PO TABS
80.0000 mg | ORAL_TABLET | Freq: Every day | ORAL | Status: DC
  Administered 2021-04-28 – 2021-04-29 (×2): 80 mg via ORAL
  Filled 2021-04-27 (×2): qty 2

## 2021-04-27 MED ORDER — IOHEXOL 300 MG/ML  SOLN
100.0000 mL | Freq: Once | INTRAMUSCULAR | Status: AC | PRN
  Administered 2021-04-27: 80 mL via INTRAVENOUS

## 2021-04-27 MED ORDER — INSULIN ASPART 100 UNIT/ML IJ SOLN
0.0000 [IU] | Freq: Three times a day (TID) | INTRAMUSCULAR | Status: DC
Start: 2021-04-28 — End: 2021-04-29
  Administered 2021-04-28 (×2): 3 [IU] via SUBCUTANEOUS
  Administered 2021-04-28: 5 [IU] via SUBCUTANEOUS
  Administered 2021-04-29 (×2): 3 [IU] via SUBCUTANEOUS

## 2021-04-27 MED ORDER — RANOLAZINE ER 500 MG PO TB12: 500.0000 mg | ORAL_TABLET | Freq: Two times a day (BID) | ORAL | 3 refills | Status: DC

## 2021-04-27 MED ORDER — ESCITALOPRAM OXALATE 10 MG PO TABS
10.0000 mg | ORAL_TABLET | Freq: Every day | ORAL | Status: DC
  Administered 2021-04-28 – 2021-04-29 (×2): 10 mg via ORAL
  Filled 2021-04-27 (×2): qty 1

## 2021-04-27 MED ORDER — ISOSORBIDE MONONITRATE ER 60 MG PO TB24
30.0000 mg | ORAL_TABLET | Freq: Every day | ORAL | Status: DC
  Administered 2021-04-28 – 2021-04-29 (×2): 30 mg via ORAL
  Filled 2021-04-27 (×2): qty 1

## 2021-04-27 MED ORDER — SODIUM CHLORIDE 0.9 % IV SOLN
1.0000 g | Freq: Once | INTRAVENOUS | Status: AC
  Administered 2021-04-27: 1 g via INTRAVENOUS
  Filled 2021-04-27: qty 10

## 2021-04-27 MED ORDER — RANOLAZINE ER 500 MG PO TB12: 500.0000 mg | ORAL_TABLET | Freq: Two times a day (BID) | ORAL | 3 refills | Status: AC

## 2021-04-27 MED ORDER — ACETAMINOPHEN 650 MG RE SUPP: 650.0000 mg | Freq: Four times a day (QID) | RECTAL | Status: DC | PRN

## 2021-04-27 MED ORDER — HEPARIN SODIUM (PORCINE) 5000 UNIT/ML IJ SOLN
5000.0000 [IU] | Freq: Three times a day (TID) | INTRAMUSCULAR | Status: DC
  Administered 2021-04-27 – 2021-04-29 (×5): 5000 [IU] via SUBCUTANEOUS
  Filled 2021-04-27 (×5): qty 1

## 2021-04-27 MED ORDER — ASPIRIN EC 81 MG PO TBEC
81.0000 mg | DELAYED_RELEASE_TABLET | Freq: Every day | ORAL | Status: DC
  Administered 2021-04-28 – 2021-04-29 (×2): 81 mg via ORAL
  Filled 2021-04-27 (×2): qty 1

## 2021-04-27 MED ORDER — SODIUM CHLORIDE 0.9 % IV BOLUS
1000.0000 mL | Freq: Once | INTRAVENOUS | Status: AC
  Administered 2021-04-27: 1000 mL via INTRAVENOUS

## 2021-04-27 MED ORDER — ENSURE ENLIVE PO LIQD
237.0000 mL | Freq: Two times a day (BID) | ORAL | Status: DC
  Administered 2021-04-28 – 2021-04-29 (×3): 237 mL via ORAL

## 2021-04-27 MED ORDER — ACETAMINOPHEN 325 MG PO TABS: 650.0000 mg | ORAL_TABLET | Freq: Four times a day (QID) | ORAL | Status: DC | PRN

## 2021-04-27 MED ORDER — ONDANSETRON HCL 4 MG/2ML IJ SOLN: 4.0000 mg | Freq: Once | INTRAMUSCULAR | Status: DC

## 2021-04-27 MED ORDER — TAMSULOSIN HCL 0.4 MG PO CAPS
0.4000 mg | ORAL_CAPSULE | Freq: Every day | ORAL | Status: DC
  Administered 2021-04-28: 0.4 mg via ORAL
  Filled 2021-04-27: qty 1

## 2021-04-27 MED ORDER — RANOLAZINE ER 500 MG PO TB12
500.0000 mg | ORAL_TABLET | Freq: Two times a day (BID) | ORAL | Status: DC
  Administered 2021-04-27 – 2021-04-29 (×4): 500 mg via ORAL
  Filled 2021-04-27 (×4): qty 1

## 2021-04-27 MED ORDER — INSULIN ASPART 100 UNIT/ML IJ SOLN: 0.0000 [IU] | Freq: Every day | INTRAMUSCULAR | Status: DC

## 2021-04-27 MED ORDER — SODIUM CHLORIDE 0.9 % IV SOLN
1.0000 g | INTRAVENOUS | Status: DC
  Administered 2021-04-28: 1 g via INTRAVENOUS
  Filled 2021-04-27: qty 10

## 2021-04-27 NOTE — ED Notes (Signed)
Patient transported to CT 

## 2021-04-27 NOTE — ED Triage Notes (Signed)
Patient states he is going to fall out in the floor if we do not get him a bed

## 2021-04-27 NOTE — ED Provider Notes (Signed)
[facility] EMERGENCY DEPARTMENT Provider Note   CSN: [id number] Arrival date & time: 04/27/21  1342     History Chief Complaint  Patient presents with   Vomiting    [name] is a 80 y.o. male.  He is brought in by his wife from cardiology clinic after he was there for routine appointment.  He said he has been dizzy and intermittent vomiting since he had his heart attack back in November.  He is weak and has not been able to ambulate well.  His left leg is been bothering him.  He has been having a headache.  Dizziness appears to happen more when he is standing.  He is a very poor historian.   The history is provided by the patient and the spouse.  Weakness Severity:  Unable to specify Onset quality:  Unable to specify Timing:  Constant Progression:  Worsening Chronicity:  New Relieved by:  Nothing Worsened by:  Activity Ineffective treatments:  None tried Associated symptoms: difficulty walking, dizziness, headaches, nausea and vomiting   Associated symptoms: no abdominal pain, no chest pain, no diarrhea, no dysuria, no fever and no shortness of breath   Risk factors: coronary artery disease       Past Medical History:  Diagnosis Date   Cancer ([place])    Chronic kidney disease    Coronary artery disease    Diabetes mellitus    Hypertension    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   LIMA to LAD, SVG to D1, SVG to OM2, SVG to RCA, open vein harvest right thigh and lower leg   Spontaneous pneumothorax 09/15/2010   right    Patient Active Problem List   Diagnosis Date Noted   Urinary retention 04/02/2021   NSTEMI (non-ST elevated myocardial infarction) ([place])    Sepsis due to Enterococcus ([place]) 03/22/2021   Bacteremia due to Enterococcus 03/22/2021   Atrial fibrillation with rapid ventricular response (HCC)    Hyponatremia    Sepsis ([place])    STEMI (ST elevation myocardial infarction) ([place]) 03/20/2021   Trochanteric avulsion fracture of femur ([place]) 07/14/2020   Chest pain  12/27/2017   Adenocarcinoma of lung, stage 4 ([place]) 09/08/2017   Abnormal CT of the abdomen 09/05/2017   CKD (chronic kidney disease) stage 3, GFR 30-59 ml/min ([place]) 06/20/2017   Type 2 diabetes mellitus without complication, without long-term current use of insulin ([place]) 11/04/2008   Coronary atherosclerosis 11/04/2008   Angina pectoris ([place]) 11/04/2008   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    Past Surgical History:  Procedure Laterality Date   ANKLE FRACTURE SURGERY  2008   [facility]   APPENDECTOMY  [phone]   CHEST TUBE INSERTION Right 09/15/2010   Dr Servando Snare - spontaneous PTX   CHOLECYSTECTOMY  2008   COLONOSCOPY N/A 09/07/2017   Procedure: COLONOSCOPY;  Surgeon: Daneil Dolin, MD;  Location: [facility] ENDO SUITE;  Service: Endoscopy;  Laterality: N/A;  10:30am   CORONARY ARTERY BYPASS GRAFT  2003   EYE SURGERY  2001   Cataracts removed bilaterally   INCISIONAL HERNIA REPAIR N/A 01/29/2015   Procedure: Fatima Blank HERNIORRHAPHY WITH MESH;  Surgeon: Aviva Signs, MD;  Location: [facility] ORS;  Service: General;  Laterality: N/A;   INSERTION OF MESH N/A 01/29/2015   Procedure: INSERTION OF MESH;  Surgeon: Aviva Signs, MD;  Location: [facility] ORS;  Service: General;  Laterality: N/A;   [place] CATH AND CORS/GRAFTS ANGIOGRAPHY  N/A 06/20/2017   Procedure: LEFT HEART CATH AND CORS/GRAFTS ANGIOGRAPHY;  Surgeon: Martinique, Peter M, MD;  Location: [place] CV LAB;  Service: Cardiovascular;  Laterality: N/A;   ORIF ANKLE FRACTURE  08/26/2011   Procedure: OPEN REDUCTION INTERNAL FIXATION (ORIF) ANKLE FRACTURE;  Surgeon: Sanjuana Kava, MD;  Location: [facility] ORS;  Service: Orthopedics;  Laterality: Right;   POLYPECTOMY  09/07/2017   Procedure: POLYPECTOMY;  Surgeon: Daneil Dolin, MD;  Location: [facility] ENDO SUITE;  Service: Endoscopy;;  ascending x2 (cold snare)   PROSTATE SURGERY  2012   Enlarged prostate   TEE WITHOUT CARDIOVERSION N/A 03/27/2021    Procedure: TRANSESOPHAGEAL ECHOCARDIOGRAM (TEE);  Surgeon: Arnoldo Lenis, MD;  Location: [facility] ORS;  Service: Endoscopy;  Laterality: N/A;   TRANSURETHRAL RESECTION OF PROSTATE  2012       Family History  Problem Relation Age of Onset   Aneurysm Mother    Heart attack Father    Colon cancer Maternal Uncle    Cancer Brother    Gastric cancer Neg Hx    Esophageal cancer Neg Hx     Social History   Tobacco Use   Smoking status: Former    Packs/day: 1.00    Years: 33.00    Pack years: 33.00    Types: Cigarettes    Quit date: 05/17/1987    Years since quitting: 33.9   Smokeless tobacco: Never  Vaping Use   Vaping Use: Never used  Substance Use Topics   Alcohol use: No   Drug use: No    Home Medications Prior to Admission medications   Medication Sig Start Date End Date Taking? Authorizing Provider  alendronate (FOSAMAX) 70 MG tablet Take 70 mg by mouth See admin instructions. Take 70 mg weekly every Sunday 12/25/20   [provider]  amLODipine (NORVASC) 10 MG tablet Take 10 mg by mouth daily.    [provider]  apixaban (ELIQUIS) 5 MG TABS tablet Take 1 tablet (5 mg total) by mouth 2 (two) times daily. 03/30/21   Johnson, Clanford L, MD  atorvastatin (LIPITOR) 80 MG tablet Take 1 tablet (80 mg total) by mouth daily. 03/31/21   Johnson, Clanford L, MD  Cholecalciferol (VITAMIN D3) 5000 units CAPS Take 5,000 Units by mouth daily.    [provider]  dabrafenib mesylate (TAFINLAR) 50 MG capsule TAKE 2 CAPSULES (100 MG TOTAL) BY MOUTH 2 (TWO) TIMES DAILY. TAKE ON AN EMPTY STOMACH 1 HOUR BEFORE OR 2 HOURS AFTER MEALS. 02/04/21   Derek Jack, MD  escitalopram (LEXAPRO) 10 MG tablet Take 10 mg by mouth daily.  04/03/18   [provider]  glimepiride (AMARYL) 1 MG tablet Take 1 tablet (1 mg total) by mouth daily with breakfast. 03/30/21   Johnson, Clanford L, MD  ipratropium (ATROVENT) 0.02 % nebulizer solution Take 2.5 mLs (0.5 mg total)  by nebulization 2 (two) times daily. 03/30/21   Johnson, Clanford L, MD  isosorbide mononitrate (IMDUR) 30 MG 24 hr tablet Take 30 mg by mouth daily.    [provider]  levalbuterol Penne Lash) 0.63 MG/3ML nebulizer solution Take 3 mLs (0.63 mg total) by nebulization 2 (two) times daily. 03/30/21   Johnson, Clanford L, MD  meclizine (ANTIVERT) 25 MG tablet Take 25 mg by mouth 3 (three) times daily as needed for dizziness.    [provider]  metFORMIN (GLUCOPHAGE-XR) 500 MG 24 hr tablet Take 1 tablet (500 mg total) by mouth 2 (two) times daily with a meal. 03/30/21  Johnson, Clanford L, MD  metoprolol succinate (TOPROL-XL) 25 MG 24 hr tablet Take 1 tablet (25 mg total) by mouth daily. 03/31/21   Johnson, Clanford L, MD  nitroGLYCERIN (NITROSTAT) 0.4 MG SL tablet Place 0.4 mg under the tongue every 5 (five) minutes as needed for chest pain.  06/01/17   [provider]  ondansetron (ZOFRAN) 4 MG tablet Take 1 tablet (4 mg total) by mouth every 6 (six) hours as needed for nausea. 03/30/21   Johnson, Clanford L, MD  ondansetron (ZOFRAN) 8 MG tablet Take 1 tablet (8 mg total) by mouth every 8 (eight) hours as needed for nausea or vomiting. 09/27/17   Derek Jack, MD  ONE TOUCH ULTRA TEST test strip  04/17/18   [provider]  polyethylene glycol (MIRALAX / GLYCOLAX) 17 g packet Take 17 g by mouth daily as needed for mild constipation. 07/17/20   Kathie Dike, MD  prochlorperazine (COMPAZINE) 10 MG tablet Take 1 tablet (10 mg total) by mouth every 6 (six) hours as needed for nausea or vomiting. 09/27/17   Derek Jack, MD  ranolazine (RANEXA) 500 MG 12 hr tablet Take 1 tablet (500 mg total) by mouth 2 (two) times daily. 04/27/21   Imogene Burn, PA-C  sacubitril-valsartan (ENTRESTO) 24-26 MG Take 1 tablet by mouth 2 (two) times daily. 03/30/21   Johnson, Clanford L, MD  spironolactone (ALDACTONE) 25 MG tablet Take 0.5 tablets (12.5 mg total) by mouth  daily. 03/31/21   Johnson, Clanford L, MD  tamsulosin (FLOMAX) 0.4 MG CAPS capsule Take 1 capsule (0.4 mg total) by mouth daily after supper. 07/17/20   Kathie Dike, MD  trametinib dimethyl sulfoxide (MEKINIST) 0.5 MG tablet TAKE 2 TABLETS (1 MG TOTAL) BY MOUTH DAILY. TAKE 1 HOUR BEFORE OR 2 HOURS AFTER A MEAL. STORE REFRIGERATED IN ORIGINAL CONTAINER. 02/04/21   Derek Jack, MD    Allergies    Oxycodone  Review of Systems   Review of Systems  Constitutional:  Positive for appetite change and fatigue. Negative for fever.  HENT:  Negative for sore throat.   Eyes:  Negative for visual disturbance.  Respiratory:  Negative for shortness of breath.   Cardiovascular:  Negative for chest pain.  Gastrointestinal:  Positive for nausea and vomiting. Negative for abdominal pain and diarrhea.  Genitourinary:  Negative for dysuria.  Musculoskeletal:  Positive for gait problem.  Skin:  Negative for rash.  Neurological:  Positive for dizziness, weakness and headaches.   Physical Exam Updated Vital Signs BP (!) 110/49 (BP Location: Right Arm)   Pulse (!) 58   Temp (!) 97.5 F (36.4 C)   Resp 16   SpO2 99%   Physical Exam Vitals and nursing note reviewed.  Constitutional:      General: He is not in acute distress.    Appearance: Normal appearance. He is well-developed.  HENT:     Head: Normocephalic and atraumatic.  Eyes:     Conjunctiva/sclera: Conjunctivae normal.  Cardiovascular:     Rate and Rhythm: Normal rate and regular rhythm.     Heart sounds: No murmur heard. Pulmonary:     Effort: Pulmonary effort is normal. No respiratory distress.     Breath sounds: Normal breath sounds.  Abdominal:     Palpations: Abdomen is soft.     Tenderness: There is no abdominal tenderness.  Musculoskeletal:        General: No swelling.     Cervical back: Neck supple.  Skin:    General: Skin  is warm and dry.     Capillary Refill: Capillary refill takes less than 2 seconds.   Neurological:     Mental Status: He is alert.     Motor: Weakness present.     Comments: Patient is awake and alert.  There is no slurred speech or obvious facial droop.  Upper extremity strength 5 x 5.  Lower extremity strength 5 out of 5 on right 4 out of 5 on left.  Sensation intact to light touch.  Psychiatric:        Mood and Affect: Mood normal.    ED Results / Procedures / Treatments   Labs (all labs ordered are listed, but only abnormal results are displayed) Labs Reviewed  BASIC METABOLIC PANEL - Abnormal; Notable for the following components:      Result Value   Sodium 125 (*)    Chloride 94 (*)    Glucose, Bld 205 (*)    Creatinine, Ser 1.45 (*)    Calcium 7.9 (*)    GFR, Estimated 48 (*)    All other components within normal limits  CBC - Abnormal; Notable for the following components:   WBC 14.9 (*)    Hemoglobin 11.4 (*)    HCT 34.3 (*)    RDW 18.6 (*)    All other components within normal limits  URINALYSIS, ROUTINE W REFLEX MICROSCOPIC - Abnormal; Notable for the following components:   APPearance CLOUDY (*)    Specific Gravity, Urine 1.033 (*)    Glucose, UA >=500 (*)    Hgb urine dipstick SMALL (*)    Ketones, ur 20 (*)    Protein, ur 30 (*)    Nitrite POSITIVE (*)    Leukocytes,Ua LARGE (*)    WBC, UA >50 (*)    Bacteria, UA FEW (*)    Non Squamous Epithelial 0-5 (*)    All other components within normal limits  GLUCOSE, CAPILLARY - Abnormal; Notable for the following components:   Glucose-Capillary 138 (*)    All other components within normal limits  COMPREHENSIVE METABOLIC PANEL - Abnormal; Notable for the following components:   Sodium 129 (*)    Chloride 97 (*)    Glucose, Bld 193 (*)    Calcium 7.9 (*)    Total Protein 6.4 (*)    Albumin 2.1 (*)    AST 13 (*)    All other components within normal limits  CBC WITH DIFFERENTIAL/PLATELET - Abnormal; Notable for the following components:   RBC 3.74 (*)    Hemoglobin 9.6 (*)    HCT 29.4 (*)     MCV 78.6 (*)    MCH 25.7 (*)    RDW 18.1 (*)    All other components within normal limits  GLUCOSE, CAPILLARY - Abnormal; Notable for the following components:   Glucose-Capillary 178 (*)    All other components within normal limits  CBG MONITORING, ED - Abnormal; Notable for the following components:   Glucose-Capillary 184 (*)    All other components within normal limits  TROPONIN I (HIGH SENSITIVITY) - Abnormal; Notable for the following components:   Troponin I (High Sensitivity) 21 (*)    All other components within normal limits  TROPONIN I (HIGH SENSITIVITY) - Abnormal; Notable for the following components:   Troponin I (High Sensitivity) 19 (*)    All other components within normal limits  RESP PANEL BY RT-PCR (FLU A&B, COVID) ARPGX2  URINE CULTURE  MAGNESIUM    EKG EKG Interpretation  Date/Time:  Monday April 27 2021 13:57:16 EST Ventricular Rate:  59 PR Interval:  186 QRS Duration: 86 QT Interval:  438 QTC Calculation: 433 R Axis:   35 Text Interpretation: Sinus bradycardia Inferior infarct , age undetermined Abnormal ECG since last tracing no significant change Confirmed by Daleen Bo [phone]) on 04/27/2021 2:12:58 PM  Radiology CT Head Wo Contrast  Result Date: 04/27/2021 CLINICAL DATA:  And 80 year old male presents for evaluation of headache with worsening symptoms. EXAM: CT HEAD WITHOUT CONTRAST TECHNIQUE: Contiguous axial images were obtained from the base of the skull through the vertex without intravenous contrast. COMPARISON:  Comparison is made with an MRI of the brain from 2019. FINDINGS: Brain: No evidence of acute infarction, hemorrhage, hydrocephalus, extra-axial collection or mass lesion/mass effect. Signs of atrophy and chronic microvascular ischemic change as before. Vascular: No hyperdense vessel or unexpected calcification. Skull: Normal. Negative for fracture or focal lesion. Sinuses/Orbits: Visualized paranasal sinuses and orbits are  unremarkable to the extent evaluated. Other: None IMPRESSION: No acute intracranial pathology. Signs of atrophy and chronic microvascular ischemic change as before. Electronically Signed   By: Zetta Bills M.D.   On: 04/27/2021 18:09   CT LUMBAR SPINE WO CONTRAST  Result Date: 04/28/2021 CLINICAL DATA:  Lumbar radiculopathy with infection suspected EXAM: CT LUMBAR SPINE WITHOUT CONTRAST TECHNIQUE: Multidetector CT imaging of the lumbar spine was performed without intravenous contrast administration. Multiplanar CT image reconstructions were also generated. COMPARISON:  None similar FINDINGS: Segmentation: 5 lumbar type vertebrae Alignment: Negative for listhesis.  Mild levocurvature. Vertebrae: No acute fracture or focal pathologic process. Paraspinal and other soft tissues: Extensive atheromatous calcification of the aorta and iliacs. Disc levels: Ordinary lumbar disc narrowing and bulging with endplate spurring. Milder facet spurring. No evidence of inflammation or neural impingement. IMPRESSION: No acute finding including evidence of infection. Mild for age lumbar spine degeneration. Electronically Signed   By: Jorje Guild M.D.   On: 04/28/2021 04:03   CT Abdomen Pelvis W Contrast  Result Date: 04/27/2021 CLINICAL DATA:  Abdominal pain, acute, nonlocalized. Dizziness, vomiting EXAM: CT ABDOMEN AND PELVIS WITH CONTRAST TECHNIQUE: Multidetector CT imaging of the abdomen and pelvis was performed using the standard protocol following bolus administration of intravenous contrast. CONTRAST:  32mL OMNIPAQUE IOHEXOL 300 MG/ML  SOLN COMPARISON:  03/22/2021 FINDINGS: Lower chest: Previously seen bilateral effusions have resolved. Numerous small nodules throughout the lungs bilaterally compatible with metastatic disease. These are stable since prior study. Coronary artery and aortic calcifications. Hepatobiliary: No focal liver abnormality is seen. Status post cholecystectomy. No biliary dilatation.  Pancreas: No focal abnormality or ductal dilatation. Spleen: No focal abnormality.  Normal size. Adrenals/Urinary Tract: Areas of cortical thinning in the kidneys bilaterally. No hydronephrosis. No renal or ureteral stones. Adrenal glands unremarkable. Foley catheter in place with bladder decompressed. Stomach/Bowel: Stomach, large and small bowel grossly unremarkable. Vascular/Lymphatic: Heavily calcified aorta and iliac vessels. 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 No aneurysm. No adenopathy. Reproductive: No visible focal abnormality. Other: No free fluid or free air. Musculoskeletal: No acute bony abnormality. IMPRESSION: Numerous small subcentimeter pulmonary nodules in the visualized lower lungs compatible with metastases, similar to prior study. Resolution of previously seen effusions and lower lobe airspace disease. Coronary artery disease, aortic atherosclerosis. 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 No acute findings in the abdomen or pelvis. Electronically Signed   By: Rolm Baptise M.D.   On: 04/27/2021 18:14   DG Chest [facility]  Result Date: 04/27/2021 CLINICAL DATA:  Weakness and dizziness. History of metastatic lung cancer. EXAM: PORTABLE CHEST 1 VIEW COMPARISON:  Chest radiograph 03/20/2021.  Chest CT 09/08/2020. 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 The cardiomediastinal silhouette is within normal limits. Aortic atherosclerosis is noted. Small widespread nodules are again seen throughout both lungs. Reticulonodular densities in both lung bases have mildly regressed from the prior study. No acute airspace consolidation, overt pulmonary edema, sizable pleural effusion, or pneumothorax is identified. IMPRESSION: Mildly regressed reticulonodular densities in both lung bases which may reflect resolving infection superimposed on metastatic lung nodules. No new abnormality identified. Electronically Signed   By: Logan Bores  M.D.   On: 04/27/2021 15:33    Procedures Procedures   Medications Ordered in ED Medications  ondansetron (ZOFRAN) injection 4 mg (0 mg Intravenous Hold 04/27/21 1552)  aspirin EC tablet 81 mg (81 mg Oral Given 04/28/21 0849)  atorvastatin (LIPITOR) tablet 80 mg (80 mg Oral Given 04/28/21 0849)  isosorbide mononitrate (IMDUR) 24 hr tablet 30 mg (30 mg Oral Given 04/28/21 0849)  metoprolol succinate (TOPROL-XL) 24 hr tablet 100 mg (100 mg Oral Given 04/28/21 0849)  ranolazine (RANEXA) 12 hr tablet 500 mg (500 mg Oral Given 04/28/21 0849)  escitalopram (LEXAPRO) tablet 10 mg (10 mg Oral Given 04/28/21 0849)  cefTRIAXone (ROCEPHIN) 1 g in sodium chloride 0.9 % 100 mL IVPB (has no administration in time range)  tamsulosin (FLOMAX) capsule 0.4 mg (has no administration in time range)  insulin aspart (novoLOG) injection 0-15 Units (3 Units Subcutaneous Given 04/28/21 0850)  insulin aspart (novoLOG) injection 0-5 Units (0 Units Subcutaneous Not Given 04/27/21 2131)  heparin injection 5,000 Units (5,000 Units Subcutaneous Given 04/28/21 0522)  acetaminophen (TYLENOL) tablet 650 mg (has no administration in time range)    Or  acetaminophen (TYLENOL) suppository 650 mg (has no administration in time range)  ondansetron (ZOFRAN) tablet 4 mg ( Oral See Alternative 04/28/21 0854)    Or  ondansetron (ZOFRAN) injection 4 mg (4 mg Intravenous Given 04/28/21 0854)  feeding supplement (ENSURE ENLIVE / ENSURE PLUS) liquid 237 mL (237 mLs Oral Given 04/28/21 0946)  Chlorhexidine Gluconate Cloth 2 % PADS 6 each (6 each Topical Given 04/28/21 0946)  0.9 %  sodium chloride infusion ( Intravenous Rate/Dose Change 04/28/21 0836)  sodium chloride 0.9 % bolus 1,000 mL (0 mLs Intravenous Stopped 04/27/21 1730)  iohexol (OMNIPAQUE) 300 MG/ML solution 100 mL (80 mLs Intravenous Contrast Given 04/27/21 1745)  cefTRIAXone (ROCEPHIN) 1 g in sodium chloride 0.9 % 100 mL IVPB (0 g Intravenous Stopped 04/27/21 1924)     ED Course  I have reviewed the triage vital signs and the nursing notes.  Pertinent labs & imaging results that were available during my care of the patient were reviewed by me and considered in my medical decision making (see chart for details).    MDM Rules/Calculators/A&P                          DATHAN ATTIA was evaluated in Emergency Department on 04/28/2021 for the symptoms described in the history of present illness. He was evaluated in the context of the global COVID-19 pandemic, which necessitated consideration that the patient might be at risk for infection with the SARS-CoV-2 virus that causes COVID-19. Institutional protocols and algorithms that pertain to the evaluation of patients at risk for COVID-19 are in a state of rapid change based on information released by regulatory bodies including  the [employer] and federal and state organizations. These policies and algorithms were followed during the patient's care in the ED.  This patient complains of weakness dizziness vomiting failure to thrive; this involves an extensive number of treatment Options and is a complaint that carries with it a high risk of complications and Morbidity. The differential includes dehydration, infection, metabolic derangement, stroke, radiculopathy  I ordered, reviewed and interpreted labs, which included CBC with elevated white count, hemoglobin slightly lower than priors, chemistries with low sodium elevated creatinine reflecting some dehydration, urinalysis with signs of infection greater than 50 white blood cells nitrite positive, opponents mildly elevated, COVID and flu negative I ordered medication IV fluids IV antibiotics I ordered imaging studies which included chest x-ray, head CT, CT abdomen pelvis and I independently    visualized and interpreted imaging which showed pulmonary nodules consistent with known metastatic disease, CT head without any acute stroke or bleed Additional history obtained  from patient's wife Previous records obtained and reviewed in epic recent MRI and I consulted [facility] hospitalist Dr. Clearence Ped And discussed lab and imaging findings  Critical Interventions: None  After the interventions stated above, I reevaluated the patient and found patient to be reasonably comfortable.  Due to his low functional status and home feel he would be better served by being admitted to the hospital.  Patient agreeable to plan.   Final Clinical Impression(s) / ED Diagnoses Final diagnoses:  Dizziness and giddiness  Urinary tract infection with hematuria, site unspecified  AKI (acute kidney injury) ([place])  Nausea and vomiting, unspecified vomiting type  Failure to thrive in adult    Rx / DC Orders ED Discharge Orders     None        Hayden Rasmussen, MD 04/28/21 1028

## 2021-04-27 NOTE — Progress Notes (Signed)
Cardiology Office Note    Date:  04/27/2021   ID:  [name], [name] 27-Oct-1940, MRN [id number]   PCP:  [name], [place]  Cardiologist:  Carlyle Dolly, MD   Advanced Practice Provider:  No care team member to display Electrophysiologist:  None   [phone]   No chief complaint on file.   History of Present Illness:  [name] is a 80 y.o. male with CAD s/p 2008 CABG with multi-vessel graft disease referred for redo CABG in 2019 but turned down due to metastatic CA,, HTN with DM, metastatic lung cancer on dabrafenib and tametinib, and normocytic anemia  Patient admitted  03/23/2021 with  NSTEMI, PNA, Bacteremia .  Troponins peaked to 8300, echo LVEF 35 to 40% with inferior hypokinesis.  Patient refused transfer and cath.  Medical management.  He was started on Entresto.  Because of bacteremia he underwent TEE-no evidence of vegetation or infective endocarditis.  Also had new A. fib in the setting of NSTEMI rates controlled CHA2DS2-VASc equals 6 on Eliquis.  Patient was DNR.  He went to [facility] 04/10/21 with RLL pneumonia and hemoptysis Na 125  Our office was called because of soft blood pressures during physical therapy.  He was advised to hold his spironolactone until seen here.  Patient comes in with his wife. He's in a wheel chair vomiting in a bag-she says he does it all the time after he eats. Gets dizzy. BS 138 this am. BP stable here. Patient's wife very confused over meds. She says she's giving him ASA now. He has constant headache and just wants die. Wife doesn't know if hospice involved. No chest pain or dyspnea. Appears dehydrated.    Past Medical History:  Diagnosis Date   Cancer ([place])    Chronic kidney disease    Coronary artery disease    Diabetes mellitus    Hypertension    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   LIMA to LAD, SVG to D1, SVG to OM2, SVG to RCA, open vein harvest right thigh and lower leg   Spontaneous  pneumothorax 09/15/2010   right    Past Surgical History:  Procedure Laterality Date   ANKLE FRACTURE SURGERY  2008   [facility]   APPENDECTOMY  [phone]   CHEST TUBE INSERTION Right 09/15/2010   Dr Servando Snare - spontaneous PTX   CHOLECYSTECTOMY  2008   COLONOSCOPY N/A 09/07/2017   Procedure: COLONOSCOPY;  Surgeon: Daneil Dolin, MD;  Location: [facility] ENDO SUITE;  Service: Endoscopy;  Laterality: N/A;  10:30am   CORONARY ARTERY BYPASS GRAFT  2003   EYE SURGERY  2001   Cataracts removed bilaterally   INCISIONAL HERNIA REPAIR N/A 01/29/2015   Procedure: Fatima Blank HERNIORRHAPHY WITH MESH;  Surgeon: Aviva Signs, MD;  Location: [facility] ORS;  Service: General;  Laterality: N/A;   INSERTION OF MESH N/A 01/29/2015   Procedure: INSERTION OF MESH;  Surgeon: Aviva Signs, MD;  Location: [facility] ORS;  Service: General;  Laterality: N/A;   [place] CATH AND CORS/GRAFTS ANGIOGRAPHY N/A 06/20/2017   Procedure: LEFT HEART CATH AND CORS/GRAFTS ANGIOGRAPHY;  Surgeon: Martinique, Peter M, MD;  Location: [place] CV LAB;  Service: Cardiovascular;  Laterality: N/A;   ORIF ANKLE FRACTURE  08/26/2011   Procedure: OPEN REDUCTION INTERNAL FIXATION (ORIF) ANKLE FRACTURE;  Surgeon: Sanjuana Kava, MD;  Location: [facility] ORS;  Service: Orthopedics;  Laterality:  Right;   POLYPECTOMY  09/07/2017   Procedure: POLYPECTOMY;  Surgeon: Daneil Dolin, MD;  Location: [facility] END[facility];  Service: Endoscopy;;  ascending x2 (cold snare)   PROSTATE SURGERY  2012   Enlarged prostate   TEE WITHOUT CARDIOVERSION N/A 03/27/2021   Procedure: TRANSESOPHAGEAL ECHOCARDIOGRAM (TEE);  Surgeon: Arnoldo Lenis, MD;  Location: [facility] ORS;  Service: Endoscopy;  Laterality: N/A;   TRANSURETHRAL RESECTION OF PROSTATE  2012    Current Medications: Current Meds  Medication Sig   alendronate (FOSAMAX) 70 MG tablet Take 70 mg by mouth See admin instructions. Take 70 mg weekly every Sunday    apixaban (ELIQUIS) 5 MG TABS tablet Take 1 tablet (5 mg total) by mouth 2 (two) times daily.   atorvastatin (LIPITOR) 80 MG tablet Take 1 tablet (80 mg total) by mouth daily.   Cholecalciferol (VITAMIN D3) 5000 units CAPS Take 5,000 Units by mouth daily.   clopidogrel (PLAVIX) 75 MG tablet Take 1 tablet (75 mg total) by mouth daily.   dabrafenib mesylate (TAFINLAR) 50 MG capsule TAKE 2 CAPSULES (100 MG TOTAL) BY MOUTH 2 (TWO) TIMES DAILY. TAKE ON AN EMPTY STOMACH 1 HOUR BEFORE OR 2 HOURS AFTER MEALS.   escitalopram (LEXAPRO) 10 MG tablet Take 10 mg by mouth daily.    glimepiride (AMARYL) 1 MG tablet Take 1 tablet (1 mg total) by mouth daily with breakfast.   ipratropium (ATROVENT) 0.02 % nebulizer solution Take 2.5 mLs (0.5 mg total) by nebulization 2 (two) times daily.   levalbuterol (XOPENEX) 0.63 MG/3ML nebulizer solution Take 3 mLs (0.63 mg total) by nebulization 2 (two) times daily.   metFORMIN (GLUCOPHAGE-XR) 500 MG 24 hr tablet Take 1 tablet (500 mg total) by mouth 2 (two) times daily with a meal.   metoprolol succinate (TOPROL-XL) 25 MG 24 hr tablet Take 1 tablet (25 mg total) by mouth daily.   nitroGLYCERIN (NITROSTAT) 0.4 MG SL tablet Place 0.4 mg under the tongue every 5 (five) minutes as needed for chest pain.    ondansetron (ZOFRAN) 4 MG tablet Take 1 tablet (4 mg total) by mouth every 6 (six) hours as needed for nausea.   ondansetron (ZOFRAN) 8 MG tablet Take 1 tablet (8 mg total) by mouth every 8 (eight) hours as needed for nausea or vomiting.   ONE TOUCH ULTRA TEST test strip    polyethylene glycol (MIRALAX / GLYCOLAX) 17 g packet Take 17 g by mouth daily as needed for mild constipation.   prochlorperazine (COMPAZINE) 10 MG tablet Take 1 tablet (10 mg total) by mouth every 6 (six) hours as needed for nausea or vomiting.   sacubitril-valsartan (ENTRESTO) 24-26 MG Take 1 tablet by mouth 2 (two) times daily.   spironolactone (ALDACTONE) 25 MG tablet Take 0.5 tablets (12.5 mg total) by  mouth daily.   tamsulosin (FLOMAX) 0.4 MG CAPS capsule Take 1 capsule (0.4 mg total) by mouth daily after supper.   trametinib dimethyl sulfoxide (MEKINIST) 0.5 MG tablet TAKE 2 TABLETS (1 MG TOTAL) BY MOUTH DAILY. TAKE 1 HOUR BEFORE OR 2 HOURS AFTER A MEAL. STORE REFRIGERATED IN ORIGINAL CONTAINER.   [DISCONTINUED] ranolazine (RANEXA) 500 MG 12 hr tablet Take 1 tablet (500 mg total) by mouth 2 (two) times daily.     Allergies:   Oxycodone   Social History   Socioeconomic History   Marital status: Married    Spouse name: Not on file   Number of children: Not on file   Years of education: Not on file  Highest education level: Not on file  Occupational History   Not on file  Tobacco Use   Smoking status: Former    Packs/day: 1.00    Years: 33.00    Pack years: 33.00    Types: Cigarettes    Quit date: 05/17/1987    Years since quitting: 33.9   Smokeless tobacco: Never  Vaping Use   Vaping Use: Never used  Substance and Sexual Activity   Alcohol use: No   Drug use: No   Sexual activity: Not Currently  Other Topics Concern   Not on file  Social History Narrative   Not on file   Social Determinants of Health   Financial Resource Strain: Low Risk    Difficulty of Paying Living Expenses: Not very hard  Food Insecurity: No Food Insecurity   Worried About Running Out of Food in the Last Year: Never true   Ran Out of Food in the Last Year: Never true  Transportation Needs: No Transportation Needs   Lack of Transportation (Medical): No   Lack of Transportation (Non-Medical): No  Physical Activity: Inactive   Days of Exercise per Week: 0 days   Minutes of Exercise per Session: 0 min  Stress: No Stress Concern Present   Feeling of Stress : Not at all  Social Connections: Socially Integrated   Frequency of Communication with Friends and Family: More than three times a week   Frequency of Social Gatherings with Friends and Family: Three times a week   Attends Religious  Services: 1 to 4 times per year   Active Member of Clubs or Organizations: No   Attends Music therapist: 1 to 4 times per year   Marital Status: Married     Family History:  The patient's  family history includes Aneurysm in his mother; Cancer in his brother; Colon cancer in his maternal uncle; Heart attack in his father.   ROS:   Please see the history of present illness.    ROS All other systems reviewed and are negative.   PHYSICAL EXAM:   VS:  BP 132/70   Pulse 78   Ht 5\' 11"  (1.803 m)   Wt 173 lb (78.5 kg)   SpO2 97%   BMI 24.13 kg/m   Physical Exam  EZM:OQHU, elderly, vomiting Neck: no JVD, carotid bruits, or masses Cardiac:RRR; no murmurs, rubs, or gallops  Respiratory:  decreased breath sounds with wheezing GI: soft, nontender, nondistended, + BS Ext: without cyanosis, clubbing, or edema, Good distal pulses bilaterally Neuro:  Alert and Oriented x 3,  Psych: euthymic mood, full affect  Wt Readings from Last 3 Encounters:  04/27/21 173 lb (78.5 kg)  03/30/21 186 lb 1.1 oz (84.4 kg)  01/15/21 174 lb 14.4 oz (79.3 kg)      Studies/Labs Reviewed:   EKG:  EKG is not ordered today.    Recent Labs: 01/09/2021: TSH 1.320 03/23/2021: ALT 11 03/28/2021: Magnesium 1.7 03/29/2021: BUN 7; Creatinine, Ser 1.13; Hemoglobin 9.2; Platelets 205; Potassium 3.6; Sodium 133   Lipid Panel No results found for: CHOL, TRIG, HDL, CHOLHDL, VLDL, LDLCALC, LDLDIRECT  Additional studies/ records that were reviewed today include:  TEE 03/27/2021 IMPRESSIONS     1. Left ventricular ejection fraction, by estimation, is 35 to 40%. The  left ventricle has moderately decreased function.   2. Right ventricular systolic function is normal. The right ventricular  size is mildly enlarged.   3. Left atrial size was severely dilated. No left atrial/left atrial  appendage thrombus was detected. The LAA emptying velocity was 56 cm/s.   4. Right atrial size was severely dilated.    5. The mitral valve is abnormal. Moderate mitral valve regurgitation. No  evidence of mitral stenosis.   6. The tricuspid valve is abnormal. Tricuspid valve regurgitation is  moderate.   7. The aortic valve is tricuspid. Aortic valve regurgitation is not  visualized. No aortic stenosis is present.   8. There is mild (Grade II) plaque.   Conclusion(s)/Recommendation(s): No evidence of vegetation/infective  endocarditis on this transesophageael echocardiogram.   2D echo 03/21/2021 IMPRESSIONS     1. Distal septal, apical mid and basal inferior wall hypokinesis . Left  ventricular ejection fraction, by estimation, is 35 to 40%. The left  ventricle has moderately decreased function. The left ventricle  demonstrates regional wall motion abnormalities  (see scoring diagram/findings for description). Left ventricular diastolic  parameters were normal.   2. Right ventricular systolic function is normal. The right ventricular  size is normal. There is moderately elevated pulmonary artery systolic  pressure.   3. Left atrial size was moderately dilated.   4. 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 The mitral valve is abnormal. Mild mitral valve  regurgitation. No evidence of mitral stenosis.   5. The aortic valve is tricuspid. There is mild calcification of the  aortic valve. Aortic valve regurgitation is not visualized. Mild aortic  valve sclerosis is present, with no evidence of aortic valve stenosis.   6. The inferior vena cava is normal in size with greater than 50%  respiratory variability, suggesting right atrial pressure of 3 mmHg.   Risk Assessment/Calculations:    CHA2DS2-VASc Score = 6   This indicates a 9.7% annual risk of stroke. The patient's score is based upon: CHF History: 1 HTN History: 1 Diabetes History: 1 Stroke History: 0 Vascular Disease History: 1 Age Score: 2 Gender Score: 0        ASSESSMENT:    1. Nausea and vomiting, unspecified vomiting type   2. Atherosclerosis  of native coronary artery of native heart without angina pectoris   3. Ischemic cardiomyopathy   4. Atrial fibrillation with rapid ventricular response ([place])   5. Adenocarcinoma of lung, stage 4, unspecified laterality ([place])   6. Bacteremia due to Enterococcus      PLAN:  In order of problems listed above:  Nausea/vomiting/dehydration/low sodium 125  04/10/21 at [facility] to ED. Needs palliative care/hospice involved.  CAD status post CABG in 2008, cath 2019 with multivessel disease turned down for redo CABG because of metastatic cancer history, inferior STEMI 03/27/2021 managed medically, LVEF 35 to 40%. Patient without recurrent chest pain-need meds from home-wife says off plavix since pneumonia and she's giving him ASA but she doesn't know what he's taking.[facility] discharge note states he was sent home on Plavix and eliquis. Meds Need verified.  Ischemic cardiomyopathy ejection fraction 35 to 40% with inferior hypokinesis.  Blood pressures running low as an outpatient spironolactone held but wife didn't bring meds and confused over what she's giving him. Appears dehydrated.  Paroxysmal atrial fibrillation new diagnosis for patient on Toprol and Eliquis for CHA2DS2-VASc of 6  Metastatic lung cancer-DNR  Recently treated for bacteremia/severe sepsis/pneumonia-at [facility]   Shared Decision Making/Informed Consent        Medication Adjustments/Labs and Tests Ordered: Current medicines are reviewed at length with the patient today.  Concerns regarding medicines are outlined above.  Medication changes, Labs and Tests ordered today are listed in the Patient Instructions  below. Patient Instructions  Medication Instructions:  Your physician recommends that you continue on your current medications as directed. Please refer to the Current Medication list given to you today.  *If you need a refill on your cardiac medications before your next appointment, please call your  pharmacy*   Lab Work: None If you have labs (blood work) drawn today and your tests are completely normal, you will receive your results only by: [place] (if you have MyChart) OR A paper copy in the mail If you have any lab test that is abnormal or we need to change your treatment, we will call you to review the results.   Testing/Procedures: None   Follow-Up: At [facility], you and your health needs are our priority.  As part of our continuing mission to provide you with exceptional heart care, we have created designated Provider Care Teams.  These Care Teams include your primary Cardiologist (physician) and Advanced Practice Providers (APPs -  Physician Assistants and Nurse Practitioners) who all work together to provide you with the care you need, when you need it.  We recommend signing up for the patient portal called "MyChart".  Sign up information is provided on this After Visit Summary.  MyChart is used to connect with patients for Virtual Visits (Telemedicine).  Patients are able to view lab/test results, encounter notes, upcoming appointments, etc.  Non-urgent messages can be sent to your provider as well.   To learn more about what you can do with MyChart, go to [email or web address].    Your next appointment:   Next Available- Dr. Myles Gip    Other Instructions     Signed, Ermalinda Barrios, PA-C  04/27/2021 1:58 PM    [facility] [address], [place], [place]  [address] Phone: [phone]; Fax: [phone]

## 2021-04-27 NOTE — ED Triage Notes (Signed)
Sent from heart care for evaluation, c/o dizziness onset a week ago

## 2021-04-27 NOTE — Patient Instructions (Signed)
Medication Instructions:  Your physician recommends that you continue on your current medications as directed. Please refer to the Current Medication list given to you today.  *If you need a refill on your cardiac medications before your next appointment, please call your pharmacy*   Lab Work: None If you have labs (blood work) drawn today and your tests are completely normal, you will receive your results only by: [place] (if you have MyChart) OR A paper copy in the mail If you have any lab test that is abnormal or we need to change your treatment, we will call you to review the results.   Testing/Procedures: None   Follow-Up: At [facility], you and your health needs are our priority.  As part of our continuing mission to provide you with exceptional heart care, we have created designated Provider Care Teams.  These Care Teams include your primary Cardiologist (physician) and Advanced Practice Providers (APPs -  Physician Assistants and Nurse Practitioners) who all work together to provide you with the care you need, when you need it.  We recommend signing up for the patient portal called "MyChart".  Sign up information is provided on this After Visit Summary.  MyChart is used to connect with patients for Virtual Visits (Telemedicine).  Patients are able to view lab/test results, encounter notes, upcoming appointments, etc.  Non-urgent messages can be sent to your provider as well.   To learn more about what you can do with MyChart, go to [email or web address].    Your next appointment:   Next Available- Dr. Myles Gip    Other Instructions

## 2021-04-27 NOTE — Addendum Note (Signed)
Addended by: Berlinda Last on: 04/27/2021 02:43 PM   Modules accepted: Orders

## 2021-04-28 ENCOUNTER — Observation Stay (HOSPITAL_COMMUNITY): Payer: Medicare Other

## 2021-04-28 ENCOUNTER — Encounter (HOSPITAL_COMMUNITY): Payer: Self-pay | Admitting: Family Medicine

## 2021-04-28 DIAGNOSIS — E860 Dehydration: Secondary | ICD-10-CM | POA: Diagnosis present

## 2021-04-28 DIAGNOSIS — T83518A Infection and inflammatory reaction due to other urinary catheter, initial encounter: Secondary | ICD-10-CM | POA: Diagnosis present

## 2021-04-28 DIAGNOSIS — Z7189 Other specified counseling: Secondary | ICD-10-CM | POA: Diagnosis not present

## 2021-04-28 DIAGNOSIS — R627 Adult failure to thrive: Secondary | ICD-10-CM

## 2021-04-28 DIAGNOSIS — N1831 Chronic kidney disease, stage 3a: Secondary | ICD-10-CM | POA: Diagnosis present

## 2021-04-28 DIAGNOSIS — I420 Dilated cardiomyopathy: Secondary | ICD-10-CM | POA: Diagnosis present

## 2021-04-28 DIAGNOSIS — E222 Syndrome of inappropriate secretion of antidiuretic hormone: Secondary | ICD-10-CM | POA: Diagnosis present

## 2021-04-28 DIAGNOSIS — N179 Acute kidney failure, unspecified: Secondary | ICD-10-CM

## 2021-04-28 DIAGNOSIS — Z951 Presence of aortocoronary bypass graft: Secondary | ICD-10-CM | POA: Diagnosis not present

## 2021-04-28 DIAGNOSIS — Z9841 Cataract extraction status, right eye: Secondary | ICD-10-CM | POA: Diagnosis not present

## 2021-04-28 DIAGNOSIS — M4726 Other spondylosis with radiculopathy, lumbar region: Secondary | ICD-10-CM | POA: Diagnosis not present

## 2021-04-28 DIAGNOSIS — R9089 Other abnormal findings on diagnostic imaging of central nervous system: Secondary | ICD-10-CM

## 2021-04-28 DIAGNOSIS — I252 Old myocardial infarction: Secondary | ICD-10-CM | POA: Diagnosis not present

## 2021-04-28 DIAGNOSIS — Z20822 Contact with and (suspected) exposure to covid-19: Secondary | ICD-10-CM | POA: Diagnosis present

## 2021-04-28 DIAGNOSIS — T83511A Infection and inflammatory reaction due to indwelling urethral catheter, initial encounter: Secondary | ICD-10-CM | POA: Diagnosis not present

## 2021-04-28 DIAGNOSIS — M5116 Intervertebral disc disorders with radiculopathy, lumbar region: Secondary | ICD-10-CM | POA: Diagnosis not present

## 2021-04-28 DIAGNOSIS — Z515 Encounter for palliative care: Secondary | ICD-10-CM | POA: Diagnosis not present

## 2021-04-28 DIAGNOSIS — I130 Hypertensive heart and chronic kidney disease with heart failure and stage 1 through stage 4 chronic kidney disease, or unspecified chronic kidney disease: Secondary | ICD-10-CM | POA: Diagnosis present

## 2021-04-28 DIAGNOSIS — C719 Malignant neoplasm of brain, unspecified: Secondary | ICD-10-CM | POA: Diagnosis present

## 2021-04-28 DIAGNOSIS — Z800 Family history of malignant neoplasm of digestive organs: Secondary | ICD-10-CM | POA: Diagnosis not present

## 2021-04-28 DIAGNOSIS — R112 Nausea with vomiting, unspecified: Secondary | ICD-10-CM

## 2021-04-28 DIAGNOSIS — E871 Hypo-osmolality and hyponatremia: Secondary | ICD-10-CM

## 2021-04-28 DIAGNOSIS — Z85118 Personal history of other malignant neoplasm of bronchus and lung: Secondary | ICD-10-CM

## 2021-04-28 DIAGNOSIS — N390 Urinary tract infection, site not specified: Secondary | ICD-10-CM

## 2021-04-28 DIAGNOSIS — R42 Dizziness and giddiness: Secondary | ICD-10-CM | POA: Diagnosis not present

## 2021-04-28 DIAGNOSIS — D630 Anemia in neoplastic disease: Secondary | ICD-10-CM | POA: Diagnosis present

## 2021-04-28 DIAGNOSIS — Z8249 Family history of ischemic heart disease and other diseases of the circulatory system: Secondary | ICD-10-CM | POA: Diagnosis not present

## 2021-04-28 DIAGNOSIS — Z9049 Acquired absence of other specified parts of digestive tract: Secondary | ICD-10-CM | POA: Diagnosis not present

## 2021-04-28 DIAGNOSIS — Z66 Do not resuscitate: Secondary | ICD-10-CM | POA: Diagnosis present

## 2021-04-28 DIAGNOSIS — E1122 Type 2 diabetes mellitus with diabetic chronic kidney disease: Secondary | ICD-10-CM | POA: Diagnosis present

## 2021-04-28 DIAGNOSIS — Z87891 Personal history of nicotine dependence: Secondary | ICD-10-CM | POA: Diagnosis not present

## 2021-04-28 DIAGNOSIS — I700 Atherosclerosis of aorta: Secondary | ICD-10-CM | POA: Diagnosis not present

## 2021-04-28 DIAGNOSIS — E119 Type 2 diabetes mellitus without complications: Secondary | ICD-10-CM

## 2021-04-28 DIAGNOSIS — Z9842 Cataract extraction status, left eye: Secondary | ICD-10-CM | POA: Diagnosis not present

## 2021-04-28 DIAGNOSIS — G319 Degenerative disease of nervous system, unspecified: Secondary | ICD-10-CM | POA: Diagnosis not present

## 2021-04-28 DIAGNOSIS — I5031 Acute diastolic (congestive) heart failure: Secondary | ICD-10-CM | POA: Diagnosis present

## 2021-04-28 DIAGNOSIS — D631 Anemia in chronic kidney disease: Secondary | ICD-10-CM | POA: Diagnosis present

## 2021-04-28 DIAGNOSIS — Y846 Urinary catheterization as the cause of abnormal reaction of the patient, or of later complication, without mention of misadventure at the time of the procedure: Secondary | ICD-10-CM | POA: Diagnosis present

## 2021-04-28 LAB — COMPREHENSIVE METABOLIC PANEL
ALT: 9 U/L (ref 0–44)
AST: 13 U/L — ABNORMAL LOW (ref 15–41)
Albumin: 2.1 g/dL — ABNORMAL LOW (ref 3.5–5.0)
Alkaline Phosphatase: 93 U/L (ref 38–126)
Anion gap: 6 (ref 5–15)
BUN: 18 mg/dL (ref 8–23)
CO2: 26 mmol/L (ref 22–32)
Calcium: 7.9 mg/dL — ABNORMAL LOW (ref 8.9–10.3)
Chloride: 97 mmol/L — ABNORMAL LOW (ref 98–111)
Creatinine, Ser: 1.17 mg/dL (ref 0.61–1.24)
GFR, Estimated: >60 mL/min (ref >60)
Glucose, Bld: 193 mg/dL — ABNORMAL HIGH (ref 70–99)
Potassium: 3.8 mmol/L (ref 3.5–5.1)
Sodium: 129 mmol/L — ABNORMAL LOW (ref 135–145)
Total Bilirubin: 0.4 mg/dL (ref 0.3–1.2)
Total Protein: 6.4 g/dL — ABNORMAL LOW (ref 6.5–8.1)

## 2021-04-28 LAB — CBC WITH DIFFERENTIAL/PLATELET
Abs Immature Granulocytes: 0.06 10*3/uL (ref 0.00–0.07)
Basophils Absolute: 0 10*3/uL (ref 0.0–0.1)
Basophils Relative: 1 %
Eosinophils Absolute: 0 10*3/uL (ref 0.0–0.5)
Eosinophils Relative: 1 %
HCT: 29.4 % — ABNORMAL LOW (ref 39.0–52.0)
Hemoglobin: 9.6 g/dL — ABNORMAL LOW (ref 13.0–17.0)
Immature Granulocytes: 1 %
Lymphocytes Relative: 20 %
Lymphs Abs: 1.5 10*3/uL (ref 0.7–4.0)
MCH: 25.7 pg — ABNORMAL LOW (ref 26.0–34.0)
MCHC: 32.7 g/dL (ref 30.0–36.0)
MCV: 78.6 fL — ABNORMAL LOW (ref 80.0–100.0)
Monocytes Absolute: 0.5 10*3/uL (ref 0.1–1.0)
Monocytes Relative: 7 %
Neutro Abs: 5.3 10*3/uL (ref 1.7–7.7)
Neutrophils Relative %: 70 %
Platelets: 217 10*3/uL (ref 150–400)
RBC: 3.74 MIL/uL — ABNORMAL LOW (ref 4.22–5.81)
RDW: 18.1 % — ABNORMAL HIGH (ref 11.5–15.5)
WBC: 7.4 10*3/uL (ref 4.0–10.5)
nRBC: 0 % (ref 0.0–0.2)

## 2021-04-28 LAB — GLUCOSE, CAPILLARY
Glucose-Capillary: 178 mg/dL — ABNORMAL HIGH (ref 70–99)
Glucose-Capillary: 187 mg/dL — ABNORMAL HIGH (ref 70–99)
Glucose-Capillary: 218 mg/dL — ABNORMAL HIGH (ref 70–99)
Glucose-Capillary: 192 mg/dL — ABNORMAL HIGH (ref 70–99)

## 2021-04-28 LAB — MAGNESIUM: Magnesium: 1.7 mg/dL (ref 1.7–2.4)

## 2021-04-28 MED ORDER — SODIUM CHLORIDE 0.9 % IV SOLN: INTRAVENOUS | Status: DC

## 2021-04-28 MED ORDER — CHLORHEXIDINE GLUCONATE CLOTH 2 % EX PADS
6.0000 | MEDICATED_PAD | Freq: Every day | CUTANEOUS | Status: DC
  Administered 2021-04-28 – 2021-04-29 (×3): 6 via TOPICAL

## 2021-04-28 MED ORDER — SODIUM CHLORIDE 1 G PO TABS
1.0000 g | ORAL_TABLET | Freq: Two times a day (BID) | ORAL | Status: DC
  Administered 2021-04-28: 1 g via ORAL
  Filled 2021-04-28: qty 1

## 2021-04-28 NOTE — Evaluation (Signed)
Physical Therapy Evaluation Patient Details Name: [name] MRN: [id number] DOB: Oct 15, 1940 Today's Date: 04/28/2021  History of Present Illness  [name]  is a 80 y.o. male, with history of cancer, CKD, coronary artery disease, diabetes mellitus type 2, hypertension, spontaneous pneumothorax, and more presents ED with chief complaint of sent by cardiology.  Patient has very little insight into his health care and has no idea why his cardiologist sent him here.  Patient is following with cardiology because of the NSTEMI that he had in November 2022.  His troponins peaked at 8300.  He had an echo with a LVEF of 35 to 40% at that time and inferior hypokinesis.  Patient refused cardiac cath at that time he was started on Entresto.  He had bacteremia so a TEE was done without vegetation or infective endocarditis.  He was then admitted at [facility] at the end of November for right lower lobe pneumonia and hemoptysis.  His spironolactone was held due to soft pressures.  Patient presented to cardiology today reported the patient has been in a wheelchair.  He has had multiple episodes of postprandial emesis.  He gets dizzy as well.  Blood pressure was stable in the cardiology office patient and his wife are confused about the patient's medications.  With patient's nausea and vomiting and low sodium at the end of November it was decided the patient should be sent to the hospital.  Cardiology also recommending that hospice or palliative care get involved with this patient.   Clinical Impression  Patient limited for functional mobility as stated below secondary to BLE weakness, fatigue and poor standing balance. Patient with nausea immediately upon being assisted to sitting. Patient demonstrates fair sitting balance but poor sitting tolerance. He requires mod/max assist to transfer to standing and ambulate to wheelchair secondary to strength and balance deficits. Patient limited to a few labored steps at  bedside. Patient left in wheelchair with hospital transport staff for MRI. Patient will benefit from continued physical therapy in hospital and recommended venue below to increase strength, balance, endurance for safe ADLs and gait.        Recommendations for follow up therapy are one component of a multi-disciplinary discharge planning process, led by the attending physician.  Recommendations may be updated based on patient status, additional functional criteria and insurance authorization.  Follow Up Recommendations Skilled nursing-short term rehab (<3 hours/day)    Assistance Recommended at Discharge Frequent or constant Supervision/Assistance  Functional Status Assessment Patient has had a recent decline in their functional status and demonstrates the ability to make significant improvements in function in a reasonable and predictable amount of time.  Equipment Recommendations  None recommended by PT    Recommendations for Other Services       Precautions / Restrictions Precautions Precautions: Fall Restrictions Weight Bearing Restrictions: No      Mobility  Bed Mobility Overal bed mobility: Needs Assistance Bed Mobility: Supine to Sit     Supine to sit: Mod assist     General bed mobility comments: slow, labored, requires assist to pull to seated and upright trunk, c/o nausea upon sitting    Transfers Overall transfer level: Needs assistance Equipment used: 1 person hand held assist Transfers: Sit to/from Stand;Bed to chair/wheelchair/BSC Sit to Stand: Mod assist;Max assist Stand pivot transfers: Mod assist;Max assist         General transfer comment: heavy transfer to standing due to weakness with use of gait belt    Ambulation/Gait Ambulation/Gait assistance:  Mod assist;Max assist Gait Distance (Feet): 3 Feet Assistive device: 1 person hand held assist Gait Pattern/deviations: Trunk flexed Gait velocity: decreased     General Gait Details: labored,  unsteady steps at bedside to wheel chair with HHA and hand on [facility]  Stairs            Wheelchair Mobility    Modified Rankin (Stroke Patients Only)       Balance Overall balance assessment: Needs assistance Sitting-balance support: Feet supported;No upper extremity supported Sitting balance-Leahy Scale: Fair Sitting balance - Comments: seated EOB   Standing balance support: Bilateral upper extremity supported Standing balance-Leahy Scale: Poor Standing balance comment: zero/poor                             Pertinent Vitals/Pain Pain Assessment: No/denies pain    Home Living Family/patient expects to be discharged to:: Private residence Living Arrangements: Spouse/significant other Available Help at Discharge: Family;Available PRN/intermittently Type of Home: House Home Access: Stairs to enter Entrance Stairs-Rails: Right;Left;Can reach both Entrance Stairs-Number of Steps: 5   Home Layout: One level Home Equipment: Cane - single point;Shower seat - built Medical sales representative (2 wheels);BSC/3in1;Wheelchair - manual      Prior Function Prior Level of Function : Needs assist             Mobility Comments: states household ambulation with SPC ADLs Comments: wife assists     Hand Dominance        Extremity/Trunk Assessment   Upper Extremity Assessment Upper Extremity Assessment: Generalized weakness    Lower Extremity Assessment Lower Extremity Assessment: Generalized weakness    Cervical / Trunk Assessment Cervical / Trunk Assessment: Kyphotic  Communication   Communication: No difficulties  Cognition Arousal/Alertness: Awake/alert Behavior During Therapy: WFL for tasks assessed/performed Overall Cognitive Status: Within Functional Limits for tasks assessed                                          General Comments      Exercises     Assessment/Plan    PT Assessment Patient needs continued PT services  PT  Problem List Decreased strength;Decreased activity tolerance;Decreased balance;Decreased mobility       PT Treatment Interventions DME instruction;Balance training;Gait training;Neuromuscular re-education;Stair training;Functional mobility training;Patient/family education;Therapeutic activities;Therapeutic exercise    PT Goals (Current goals can be found in the Care Plan section)  Acute Rehab PT Goals Patient Stated Goal: return home PT Goal Formulation: With patient Time For Goal Achievement: 05/12/21 Potential to Achieve Goals: Fair    Frequency Min 3X/week   Barriers to discharge        Co-evaluation               AM-PAC PT "6 Clicks" Mobility  Outcome Measure Help needed turning from your back to your side while in a flat bed without using bedrails?: None Help needed moving from lying on your back to sitting on the side of a flat bed without using bedrails?: A Lot Help needed moving to and from a bed to a chair (including a wheelchair)?: A Lot Help needed standing up from a chair using your arms (e.g., wheelchair or bedside chair)?: A Lot Help needed to walk in hospital room?: A Lot Help needed climbing 3-5 steps with a railing? : Total 6 Click Score: 13    End of Session  Equipment Utilized During Treatment: Gait belt Activity Tolerance: Patient limited by fatigue Patient left: Other (comment) (in [place] with transport staff) Nurse Communication: Mobility status PT Visit Diagnosis: Unsteadiness on feet (R26.81);Other abnormalities of gait and mobility (R26.89);Muscle weakness (generalized) (M62.81);History of falling (Z91.81)    Time: 0865-7846 PT Time Calculation (min) (ACUTE ONLY): 21 min   Charges:   PT Evaluation $PT Eval Low Complexity: 1 Low PT Treatments $Therapeutic Activity: 8-22 mins        10:05 AM, 04/28/21 Mearl Latin PT, DPT Physical Therapist at [facility]

## 2021-04-28 NOTE — H&P (Signed)
TRH H&P    Patient Demographics:    [name], is a 80 y.o. male  MRN: [id number]  DOB - 11-Sep-1940  Admit Date - 04/27/2021  Referring MD/NP/PA: Melina Copa  Outpatient Primary MD for the patient is [name], MD  Patient coming from: Home  Chief complaint- sent by Dr    HPI:    [name]  is a 80 y.o. male, with history of cancer, CKD, coronary artery disease, diabetes mellitus type 2, hypertension, spontaneous pneumothorax, and more presents ED with chief complaint of sent by cardiology.  Patient has very little insight into his health care and has no idea why his cardiologist sent him here.  Patient is following with cardiology because of the NSTEMI that he had in November 2022.  His troponins peaked at 8300.  He had an echo with a LVEF of 35 to 40% at that time and inferior hypokinesis.  Patient refused cardiac cath at that time he was started on Entresto.  He had bacteremia so a TEE was done without vegetation or infective endocarditis.  He was then admitted at [facility] at the end of November for right lower lobe pneumonia and hemoptysis.  His spironolactone was held due to soft pressures.  Patient presented to cardiology today reported the patient has been in a wheelchair.  He has had multiple episodes of postprandial emesis.  He gets dizzy as well.  Blood pressure was stable in the cardiology office patient and his wife are confused about the patient's medications.  With patient's nausea and vomiting and low sodium at the end of November it was decided the patient should be sent to the hospital.  Cardiology also recommending that hospice or palliative care get involved with this patient.  Today patient reports he does not know why he is here.  He thinks he had some left leg weakness at home.  He reports its been going on for about 1 week and is not painful.  He reports he has not been able to walk for 2  weeks and has been in a wheelchair.  He denies dysuria, change in urine output, hematuria, fevers, confusion.  He does admit to dizziness and nausea and vomiting as he did with the cardiologist.  He reports he is having nausea and vomiting 2 times a day.  He is not eating because he has no appetite.  Patient speech is a little slurred but he reports that it sounds normal to him.  Patient has no other complaints at this time.  Patient does not smoke, does not drink alcohol, does not use illicit drugs.  He is vaccinated for COVID.  Patient is DNR.  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 CT head shows no acute intracranial pathology Chest x-ray shows mildly progressed reticulonodular densities in both lungs which may reflect resolving infection superimposed on metastatic lung nodules EKG shows a heart rate 59, sinus bradycardia, QTC 433 Patient was given 1 L bolus and Rocephin Urine culture pending Admission requested for UTI and left leg weakness    Review of systems:    In addition to the HPI above,  No Fever-chills, Patient admits to chronic headaches no changes with Vision or hearing, No problems swallowing food or Liquids, No Chest pain, Cough or Shortness of Breath, No Abdominal pain, patient mitts to nausea and vomiting bowel movements are regular, No Blood in stool or Urine, No dysuria, No new skin rashes or bruises, No new joints pains-aches,  No  new weakness, tingling, numbness in any extremity, No recent weight gain or loss, No polyuria, polydypsia or polyphagia, No significant Mental Stressors.  All other systems reviewed and are negative.    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   LIMA to LAD, SVG to D1, SVG to OM2, SVG to RCA, open vein harvest right thigh and lower leg   Spontaneous pneumothorax 09/15/2010   right      Past Surgical History:  Procedure Laterality Date   ANKLE FRACTURE SURGERY  2008   [facility]   APPENDECTOMY  [id number]   CHEST TUBE INSERTION Right 09/15/2010   Dr Servando Snare - spontaneous PTX   CHOLECYSTECTOMY  2008   COLONOSCOPY N/A 09/07/2017   Procedure: COLONOSCOPY;  Surgeon: Daneil Dolin, MD;  Location: [facility];  Service: Endoscopy;  Laterality: N/A;  10:30am   CORONARY ARTERY BYPASS GRAFT  2003   EYE SURGERY  2001   Cataracts removed bilaterally   INCISIONAL HERNIA REPAIR N/A 01/29/2015   Procedure: Fatima Blank HERNIORRHAPHY WITH MESH;  Surgeon: Aviva Signs, MD;  Location: [facility] ORS;  Service: General;  Laterality: N/A;   INSERTION OF MESH N/A 01/29/2015   Procedure: INSERTION OF MESH;  Surgeon: Aviva Signs, MD;  Location: [facility] ORS;  Service: General;  Laterality: N/A;   [place] CATH AND CORS/GRAFTS ANGIOGRAPHY N/A 06/20/2017   Procedure: LEFT HEART CATH AND CORS/GRAFTS ANGIOGRAPHY;  Surgeon: Martinique, Peter M, MD;  Location: [facility];  Service: Cardiovascular;  Laterality: N/A;   ORIF ANKLE FRACTURE  08/26/2011   Procedure: OPEN REDUCTION INTERNAL FIXATION (ORIF) ANKLE FRACTURE;  Surgeon: Sanjuana Kava, MD;  Location: [facility] ORS;  Service: Orthopedics;  Laterality: Right;   POLYPECTOMY  09/07/2017   Procedure: POLYPECTOMY;  Surgeon: Daneil Dolin, MD;  Location: [facility];  Service: Endoscopy;;  ascending x2 (cold snare)   PROSTATE SURGERY  2012   Enlarged prostate   TEE WITHOUT CARDIOVERSION N/A 03/27/2021   Procedure: TRANSESOPHAGEAL ECHOCARDIOGRAM (TEE);  Surgeon: Arnoldo Lenis, MD;  Location: [facility] OR[facility];  Service: Endoscopy;  Laterality: N/A;   TRANSURETHRAL RESECTION OF PROSTATE  2012      Social History:      Social History   Tobacco Use  Smoking status: Former    Packs/day: 1.00    Years: 33.00    Pack years: 33.00    Types: Cigarettes    Quit date: 05/17/1987    Years since quitting: 33.9   Smokeless tobacco: Never  Substance Use Topics   Alcohol use: No       Family History :     Family History  Problem Relation Age of Onset   Aneurysm Mother    Heart attack Father    Colon cancer Maternal Uncle    Cancer Brother    Gastric cancer Neg Hx    Esophageal cancer Neg Hx       Home Medications:   Prior to Admission medications   Medication Sig Start Date End Date Taking? Authorizing Provider  alendronate (FOSAMAX) 70 MG tablet Take 70 mg by mouth See admin instructions. Take 70 mg weekly every Sunday 12/25/20  Yes [provider]  amLODipine (NORVASC) 10 MG tablet Take 10 mg by mouth daily.   Yes [provider]  aspirin EC 81 MG tablet Take 81 mg by mouth daily. Swallow whole.   Yes [provider]  atorvastatin (LIPITOR) 80 MG tablet Take 1 tablet (80 mg total) by mouth daily. 03/31/21  Yes Johnson, Clanford L, MD  benzonatate (TESSALON) 200 MG capsule Take 200 mg by mouth 3 (three) times daily as needed for cough.   Yes [provider]  Cholecalciferol (VITAMIN D3) 5000 units CAPS Take 5,000 Units by mouth daily.   Yes [provider]  dabrafenib mesylate (TAFINLAR) 50 MG capsule TAKE 2 CAPSULES (100 MG TOTAL) BY MOUTH 2 (TWO) TIMES DAILY. TAKE ON AN EMPTY STOMACH 1 HOUR BEFORE OR 2 HOURS AFTER MEALS. 02/04/21  Yes Derek Jack, MD  DOCUSATE  SODIUM PO Take 50 mg by mouth daily.   Yes [provider]  escitalopram (LEXAPRO) 10 MG tablet Take 10 mg by mouth daily.  04/03/18  Yes [provider]  glimepiride (AMARYL) 1 MG tablet Take 1 tablet (1 mg total) by mouth daily with breakfast. 03/30/21  Yes Johnson, Clanford L, MD  insulin aspart (NOVOLOG) 100 UNIT/ML injection Inject 2-8 Units into the skin daily as needed for high blood sugar. SLIDING SCALE   Yes [provider]  isosorbide mononitrate (IMDUR) 30 MG 24 hr tablet Take 30 mg by mouth daily.   Yes [provider]  meclizine (ANTIVERT) 25 MG tablet Take 25 mg by mouth 3 (three) times daily as needed for dizziness.   Yes [provider]  metFORMIN (GLUCOPHAGE-XR) 500 MG 24 hr tablet Take 1 tablet (500 mg total) by mouth 2 (two) times daily with a meal. 03/30/21  Yes Johnson, Clanford L, MD  metoprolol succinate (TOPROL-XL) 25 MG 24 hr tablet Take 1 tablet (25 mg total) by mouth daily. Patient taking differently: Take 100 mg by mouth daily. 03/31/21  Yes Johnson, Clanford L, MD  nitroGLYCERIN (NITROSTAT) 0.4 MG SL tablet Place 0.4 mg under the tongue every 5 (five) minutes as needed for chest pain.  06/01/17  Yes [provider]  ondansetron (ZOFRAN) 4 MG tablet Take 1 tablet (4 mg total) by mouth every 6 (six) hours as needed for nausea. 03/30/21  Yes Johnson, Clanford L, MD  pioglitazone (ACTOS) 45 MG tablet Take 45 mg by mouth daily.   Yes [provider]  polyethylene glycol (MIRALAX / GLYCOLAX) 17 g packet Take 17 g by mouth daily as needed for mild constipation. 07/17/20  Yes Kathie Dike,  MD  ranolazine (RANEXA) 500 MG 12 hr tablet Take 1 tablet (500 mg total) by mouth 2 (two) times daily. 04/27/21  Yes Imogene Burn, PA-C  tamsulosin (FLOMAX) 0.4 MG CAPS capsule Take 1 capsule (0.4 mg total) by mouth daily after supper. 07/17/20  Yes Kathie Dike, MD  trametinib dimethyl sulfoxide (MEKINIST) 0.5 MG tablet TAKE 2  TABLETS (1 MG TOTAL) BY MOUTH DAILY. TAKE 1 HOUR BEFORE OR 2 HOURS AFTER A MEAL. STORE REFRIGERATED IN ORIGINAL CONTAINER. 02/04/21  Yes Derek Jack, MD  apixaban (ELIQUIS) 5 MG TABS tablet Take 1 tablet (5 mg total) by mouth 2 (two) times daily. Patient not taking: Reported on 04/27/2021 03/30/21   Irwin Brakeman L, MD  ipratropium (ATROVENT) 0.02 % nebulizer solution Take 2.5 mLs (0.5 mg total) by nebulization 2 (two) times daily. Patient not taking: Reported on 04/27/2021 03/30/21   Murlean Iba, MD  levalbuterol (XOPENEX) 0.63 MG/3ML nebulizer solution Take 3 mLs (0.63 mg total) by nebulization 2 (two) times daily. Patient not taking: Reported on 04/27/2021 03/30/21   Murlean Iba, MD  ondansetron (ZOFRAN) 8 MG tablet Take 1 tablet (8 mg total) by mouth every 8 (eight) hours as needed for nausea or vomiting. Patient not taking: Reported on 04/27/2021 09/27/17   Derek Jack, MD  ONE TOUCH ULTRA TEST test strip  04/17/18   [provider]  prochlorperazine (COMPAZINE) 10 MG tablet Take 1 tablet (10 mg total) by mouth every 6 (six) hours as needed for nausea or vomiting. Patient not taking: Reported on 04/27/2021 09/27/17   Derek Jack, MD  sacubitril-valsartan (ENTRESTO) 24-26 MG Take 1 tablet by mouth 2 (two) times daily. Patient not taking: Reported on 04/27/2021 03/30/21   Murlean Iba, MD  spironolactone (ALDACTONE) 25 MG tablet Take 0.5 tablets (12.5 mg total) by mouth daily. Patient not taking: Reported on 04/27/2021 03/31/21   Murlean Iba, MD     Allergies:     Allergies  Allergen Reactions   Oxycodone      Physical Exam:   Vitals  Blood pressure (!) 115/49, pulse 64, temperature (!) 97.5 F (36.4 C), temperature source Oral, resp. rate 19, height 5' 10.98" (1.803 m), weight 69.1 kg, SpO2 98 %.   1.  General: Patient lying supine in bed,  no acute distress   2. Psychiatric: Alert and oriented x 3, mood and  behavior normal for situation, pleasant and cooperative with exam   3. Neurologic: Speech is slurred-patient reports this is chronic and language is normal, face is symmetric, moves all 4 extremities voluntarily, left leg has 4 out of 5 strength of right leg has 5 out of 5 strength, equal strength in the upper extremities   4. HEENMT:  Head is atraumatic, normocephalic, pupils reactive to light, neck is cachectic, trachea is midline, mucous membranes are moist   5. Respiratory : Lungs are clear to auscultation bilaterally without wheezing, rhonchi, rales, no cyanosis, no increase in work of breathing or accessory muscle use   6. Cardiovascular : Heart rate normal, rhythm is regular, no murmurs, rubs or gallops, no peripheral edema, peripheral pulses palpated   7. Gastrointestinal:  Abdomen is soft, nondistended, nontender to palpation bowel sounds active, no masses or organomegaly palpated   8. Skin:  Skin is warm, dry and intact without rashes, acute lesions, or ulcers on limited exam   9.Musculoskeletal:  No acute deformities or trauma, no asymmetry in tone, no peripheral edema, peripheral pulses palpated, no tenderness to palpation  in the extremities     Data Review:    CBC Recent Labs  Lab 04/27/21 1407  WBC 14.9*  HGB 11.4*  HCT 34.3*  PLT 313  MCV 80.3  MCH 26.7  MCHC 33.2  RDW 18.6*   ------------------------------------------------------------------------------------------------------------------  Results for orders placed or performed during the hospital encounter of 04/27/21 (from the past 48 hour(s))  Basic metabolic panel     Status: Abnormal   Collection Time: 04/27/21  2:07 PM  Result Value Ref Range   Sodium 125 (L) 135 - 145 mmol/L   Potassium 3.7 3.5 - 5.1 mmol/L   Chloride 94 (L) 98 - 111 mmol/L   CO2 26 22 - 32 mmol/L   Glucose, Bld 205 (H) 70 - 99 mg/dL    Comment: Glucose reference range applies only to samples taken after fasting for at least 8  hours.   BUN 19 8 - 23 mg/dL   Creatinine, Ser 1.45 (H) 0.61 - 1.24 mg/dL   Calcium 7.9 (L) 8.9 - 10.3 mg/dL   GFR, Estimated 48 (L) >60 mL/min    Comment: (NOTE) Calculated using the CKD-EPI Creatinine Equation (2021)    Anion gap 5 5 - 15    Comment: Performed at [facility], [address] [place], [place] [address]  CBC     Status: Abnormal   Collection Time: 04/27/21  2:07 PM  Result Value Ref Range   WBC 14.9 (H) 4.0 - 10.5 K/uL   RBC 4.27 4.22 - 5.81 MIL/uL   Hemoglobin 11.4 (L) 13.0 - 17.0 g/dL   HCT 34.3 (L) 39.0 - 52.0 %   MCV 80.3 80.0 - 100.0 fL   MCH 26.7 26.0 - 34.0 pg   MCHC 33.2 30.0 - 36.0 g/dL   RDW 18.6 (H) 11.5 - 15.5 %   Platelets 313 150 - 400 K/uL   nRBC 0.0 0.0 - 0.2 %    Comment: Performed at [facility], [address] [place], [place] [address]  Troponin I (High Sensitivity)     Status: Abnormal   Collection Time: 04/27/21  2:07 PM  Result Value Ref Range   Troponin I (High Sensitivity) 21 (H) <18 ng/L    Comment: (NOTE) Elevated high sensitivity troponin I (hsTnI) values and significant  changes across serial measurements may suggest ACS but many other  chronic and acute conditions are known to elevate hsTnI results.  Refer to the "Links" section for chest pain algorithms and additional  guidance. Performed at [facility], [address], [place], [place] [address]   CBG monitoring, ED     Status: Abnormal   Collection Time: 04/27/21  2:11 PM  Result Value Ref Range   Glucose-Capillary 184 (H) 70 - 99 mg/dL    Comment: Glucose reference range applies only to samples taken after fasting for at least 8 hours.  Urinalysis, Routine w reflex microscopic Urine, Clean Catch     Status: Abnormal   Collection Time: 04/27/21  5:57 PM  Result Value Ref Range   Color, Urine YELLOW YELLOW   APPearance CLOUDY (A) CLEAR   Specific Gravity, Urine 1.033 (H) 1.005 - 1.030   pH 6.0 5.0 - 8.0   Glucose, UA >=500 (A) NEGATIVE mg/dL   Hgb urine dipstick SMALL  (A) NEGATIVE   Bilirubin Urine NEGATIVE NEGATIVE   Ketones, ur 20 (A) NEGATIVE mg/dL   Protein, ur 30 (A) NEGATIVE mg/dL   Nitrite POSITIVE (A) NEGATIVE   Leukocytes,Ua LARGE (A) NEGATIVE   RBC /  HPF 21-50 0 - 5 RBC/hpf   WBC, UA >50 (H) 0 - 5 WBC/hpf   Bacteria, UA FEW (A) NONE SEEN   WBC Clumps PRESENT    Mucus PRESENT    Non Squamous Epithelial 0-5 (A) NONE SEEN    Comment: Performed at [facility], [address], [place], [place] [address]  Troponin I (High Sensitivity)     Status: Abnormal   Collection Time: 04/27/21  6:08 PM  Result Value Ref Range   Troponin I (High Sensitivity) 19 (H) <18 ng/L    Comment: (NOTE) Elevated high sensitivity troponin I (hsTnI) values and significant  changes across serial measurements may suggest ACS but many other  chronic and acute conditions are known to elevate hsTnI results.  Refer to the "Links" section for chest pain algorithms and additional  guidance. Performed at [facility], [address] [place], [place] [address]   Resp Panel by RT-PCR (Flu A&B, Covid) Nasopharyngeal Swab     Status: None   Collection Time: 04/27/21  7:01 PM   Specimen: Nasopharyngeal Swab; Nasopharyngeal(NP) swabs in vial transport medium  Result Value Ref Range   SARS Coronavirus 2 by RT PCR NEGATIVE NEGATIVE    Comment: (NOTE) SARS-CoV-2 target nucleic acids are NOT DETECTED.  The SARS-CoV-2 RNA is generally detectable in upper respiratory specimens during the acute phase of infection. The lowest concentration of SARS-CoV-2 viral copies this assay can detect is 138 copies/mL. A negative result does not preclude SARS-Cov-2 infection and should not be used as the sole basis for treatment or other patient management decisions. A negative result may occur with  improper specimen collection/handling, submission of specimen other than nasopharyngeal swab, presence of viral mutation(s) within the areas targeted by this assay, and inadequate number of  viral copies(<138 copies/mL). A negative result must be combined with clinical observations, patient history, and epidemiological information. The expected result is Negative.  Fact Sheet for Patients:  EntrepreneurPulse.com.au  Fact Sheet for Healthcare Providers:  IncredibleEmployment.be  This test is no t yet approved or cleared by the [place] FDA and  has been authorized for detection and/or diagnosis of SARS-CoV-2 by FDA under an Emergency Use Authorization (EUA). This EUA will remain  in effect (meaning this test can be used) for the duration of the COVID-19 declaration under Section 564(b)(1) of the Act, 21 U.S.C.section 360bbb-3(b)(1), unless the authorization is terminated  or revoked sooner.       Influenza A by PCR NEGATIVE NEGATIVE   Influenza B by PCR NEGATIVE NEGATIVE    Comment: (NOTE) The Xpert Xpress SARS-CoV-2/FLU/RSV plus assay is intended as an aid in the diagnosis of influenza from Nasopharyngeal swab specimens and should not be used as a sole basis for treatment. Nasal washings and aspirates are unacceptable for Xpert Xpress SARS-CoV-2/FLU/RSV testing.  Fact Sheet for Patients: EntrepreneurPulse.com.au  Fact Sheet for Healthcare Providers: IncredibleEmployment.be  This test is not yet approved or cleared by the [place] FDA and has been authorized for detection and/or diagnosis of SARS-CoV-2 by FDA under an Emergency Use Authorization (EUA). This EUA will remain in effect (meaning this test can be used) for the duration of the COVID-19 declaration under Section 564(b)(1) of the Act, 21 U.S.C. section 360bbb-3(b)(1), unless the authorization is terminated or revoked.  Performed at [facility], [address] [place], [place] [address]   Glucose, capillary     Status: Abnormal   Collection Time: 04/27/21  8:53 PM  Result Value Ref Range   Glucose-Capillary  138 (H) 70  - 99 mg/dL    Comment: Glucose reference range applies only to samples taken after fasting for at least 8 hours.    Chemistries  Recent Labs  Lab 04/27/21 1407  NA 125*  K 3.7  CL 94*  CO2 26  GLUCOSE 205*  BUN 19  CREATININE 1.45*  CALCIUM 7.9*   ------------------------------------------------------------------------------------------------------------------  ------------------------------------------------------------------------------------------------------------------ GFR: Estimated Creatinine Clearance: 39.1 mL/min (A) (by C-G formula based on SCr of 1.45 mg/dL (H)). Liver Function Tests: No results for input(s): AST, ALT, ALKPHOS, BILITOT, PROT, ALBUMIN in the last 168 hours. No results for input(s): LIPASE, AMYLASE in the last 168 hours. No results for input(s): AMMONIA in the last 168 hours. Coagulation Profile: No results for input(s): INR, PROTIME in the last 168 hours. Cardiac Enzymes: No results for input(s): CKTOTAL, CKMB, CKMBINDEX, TROPONINI in the last 168 hours. BNP (last 3 results) No results for input(s): PROBNP in the last 8760 hours. HbA1C: No results for input(s): HGBA1C in the last 72 hours. CBG: Recent Labs  Lab 04/27/21 1411 04/27/21 2053  GLUCAP 184* 138*   Lipid Profile: No results for input(s): CHOL, HDL, LDLCALC, TRIG, CHOLHDL, LDLDIRECT in the last 72 hours. Thyroid Function Tests: No results for input(s): TSH, T4TOTAL, FREET4, T3FREE, THYROIDAB in the last 72 hours. Anemia Panel: No results for input(s): VITAMINB12, FOLATE, FERRITIN, TIBC, IRON, RETICCTPCT in the last 72 hours.  --------------------------------------------------------------------------------------------------------------- Urine analysis:    Component Value Date/Time   COLORURINE YELLOW 04/27/2021 1757   APPEARANCEUR CLOUDY (A) 04/27/2021 1757   LABSPEC 1.033 (H) 04/27/2021 1757   PHURINE 6.0 04/27/2021 1757   GLUCOSEU >=500 (A) 04/27/2021 1757   HGBUR SMALL (A)  04/27/2021 1757   BILIRUBINUR NEGATIVE 04/27/2021 1757   KETONESUR 20 (A) 04/27/2021 1757   PROTEINUR 30 (A) 04/27/2021 1757   UROBILINOGEN 0.2 05/05/2007 1658   NITRITE POSITIVE (A) 04/27/2021 1757   LEUKOCYTESUR LARGE (A) 04/27/2021 1757      Imaging Results:    CT Head Wo Contrast  Result Date: 04/27/2021 CLINICAL DATA:  And 80 year old male presents for evaluation of headache with worsening symptoms. EXAM: CT HEAD WITHOUT CONTRAST TECHNIQUE: Contiguous axial images were obtained from the base of the skull through the vertex without intravenous contrast. COMPARISON:  Comparison is made with an MRI of the brain from 2019. FINDINGS: Brain: No evidence of acute infarction, hemorrhage, hydrocephalus, extra-axial collection or mass lesion/mass effect. Signs of atrophy and chronic microvascular ischemic change as before. Vascular: No hyperdense vessel or unexpected calcification. Skull: Normal. Negative for fracture or focal lesion. Sinuses/Orbits: Visualized paranasal sinuses and orbits are unremarkable to the extent evaluated. Other: None IMPRESSION: No acute intracranial pathology. Signs of atrophy and chronic microvascular ischemic change as before. Electronically Signed   By: Zetta Bills M.D.   On: 04/27/2021 18:09   CT LUMBAR SPINE WO CONTRAST  Result Date: 04/28/2021 CLINICAL DATA:  Lumbar radiculopathy with infection suspected EXAM: CT LUMBAR SPINE WITHOUT CONTRAST TECHNIQUE: Multidetector CT imaging of the lumbar spine was performed without intravenous contrast administration. Multiplanar CT image reconstructions were also generated. COMPARISON:  None similar FINDINGS: Segmentation: 5 lumbar type vertebrae Alignment: Negative for listhesis.  Mild levocurvature. Vertebrae: No acute fracture or focal pathologic process. Paraspinal and other soft tissues: Extensive atheromatous calcification of the aorta and iliacs. Disc levels: Ordinary lumbar disc narrowing and bulging with endplate  spurring. Milder facet spurring. No evidence of inflammation or neural impingement. IMPRESSION: No acute finding including evidence of infection. Mild for age  lumbar spine degeneration. Electronically Signed   By: Jorje Guild M.D.   On: 04/28/2021 04:03   CT Abdomen Pelvis W Contrast  Result Date: 04/27/2021 CLINICAL DATA:  Abdominal pain, acute, nonlocalized. Dizziness, vomiting EXAM: CT ABDOMEN AND PELVIS WITH CONTRAST TECHNIQUE: Multidetector CT imaging of the abdomen and pelvis was performed using the standard protocol following bolus administration of intravenous contrast. CONTRAST:  61mL OMNIPAQUE IOHEXOL 300 MG/ML  SOLN COMPARISON:  03/22/2021 FINDINGS: Lower chest: Previously seen bilateral effusions have resolved. Numerous small nodules throughout the lungs bilaterally compatible with metastatic disease. These are stable since prior study. Coronary artery and aortic calcifications. Hepatobiliary: No focal liver abnormality is seen. Status post cholecystectomy. No biliary dilatation. Pancreas: No focal abnormality or ductal dilatation. Spleen: No focal abnormality.  Normal size. Adrenals/Urinary Tract: Areas of cortical thinning in the kidneys bilaterally. No hydronephrosis. No renal or ureteral stones. Adrenal glands unremarkable. Foley catheter in place with bladder decompressed. Stomach/Bowel: Stomach, large and small bowel grossly unremarkable. Vascular/Lymphatic: Heavily calcified aorta and iliac vessels. 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 No aneurysm. No adenopathy. Reproductive: No visible focal abnormality. Other: No free fluid or free air. Musculoskeletal: No acute bony abnormality. IMPRESSION: Numerous small subcentimeter pulmonary nodules in the visualized lower lungs compatible with metastases, similar to prior study. Resolution of previously seen effusions and lower lobe airspace disease. Coronary artery disease, aortic atherosclerosis. 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 No acute findings in the abdomen or pelvis. Electronically Signed   By: Rolm Baptise M.D.   On: 04/27/2021 18:14   DG Chest Port 1 View  Result Date: 04/27/2021 CLINICAL DATA:  Weakness and dizziness. History of metastatic lung cancer. EXAM: PORTABLE CHEST 1 VIEW COMPARISON:  Chest radiograph 03/20/2021.  Chest CT 09/08/2020. 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 The cardiomediastinal silhouette is within normal limits. Aortic atherosclerosis is noted. Small widespread nodules are again seen throughout both lungs. Reticulonodular densities in both lung bases have mildly regressed from the prior study. No acute airspace consolidation, overt pulmonary edema, sizable pleural effusion, or pneumothorax is identified. IMPRESSION: Mildly regressed reticulonodular densities in both lung bases which may reflect resolving infection superimposed on metastatic lung nodules. No new abnormality identified. Electronically Signed   By: Logan Bores M.D.   On: 04/27/2021 15:33       Assessment & Plan:    Principal Problem:   UTI (urinary tract infection) Active Problems:   Type 2 diabetes mellitus without complication, without long-term current use of insulin (HCC)   Hyponatremia   Dizziness   AKI (acute kidney injury) ([place])   Dizziness Likely multifactorial including dehydration, hyponatremia, UTI CT head shows no acute intracranial pathology Check MRI in the a.m. Gentle hydration Trend sodium Continue to monitor Hyponatremia Sodium 125, this is the same as it was at the end of November at [facility], however at the beginning of November sodium was 133 Likely secondary to poor p.o. intake Gentle hydration Sodium tabs with meals Continue to trend UTI UA indicative of UTI Rocephin started Urine culture pending Continue to monitor AKI Creatinine baseline 1.13, today 1.45 Secondary to dehydration and poor p.o. intake 1 L bolus  given in the ED Continue gentle hydration in the setting of CHF Trend in a.m. Diabetes mellitus type 2 Sliding scale coverage Carb modified diet Hypocalcemia 7.9 Likely will correct with albumin as well as albumin from her previous hospitalization was 1.6 Trend and CMP  in the a.m. and address accordingly Left leg weakness The chronicity is unclear MRI in the a.m. CT head was negative for acute findings Consult PT eval and treat CT lumbar spine showed no evidence of infection, mild lumbar spine degeneration    DVT Prophylaxis-   Heparin - SCDs   AM Labs Ordered, also please review Full Orders  Family Communication: No family at bedside  Code Status: DNR  Admission status: Observation  Disposition: Anticipated Discharge date 24-48 hours discharge to SNF?  Time spent in minutes : [place]

## 2021-04-28 NOTE — Progress Notes (Signed)
04/28/2021 12:19 PM  MRI results received. Will discuss with palliative team and pending goals of care discussions determine next steps. At this time, he is feeling better and seems to be responding to medical treatments. I doubt he would be a candidate for any other aggressive treatments given his advanced age, frailty and comorbidities.  Murvin Natal MD

## 2021-04-28 NOTE — Plan of Care (Signed)
°  Problem: Acute Rehab PT Goals(only PT should resolve) Goal: Pt Will Go Supine/Side To Sit Outcome: Progressing Flowsheets (Taken 04/28/2021 1007) Pt will go Supine/Side to Sit: with minimal assist Goal: Pt Will Go Sit To Supine/Side Outcome: Progressing Flowsheets (Taken 04/28/2021 1007) Pt will go Sit to Supine/Side: with minimal assist Goal: Patient Will Transfer Sit To/From Stand Outcome: Progressing Flowsheets (Taken 04/28/2021 1007) Patient will transfer sit to/from stand:  with minimal assist  with moderate assist Goal: Pt Will Transfer Bed To Chair/Chair To Bed Outcome: Progressing Flowsheets (Taken 04/28/2021 1007) Pt will Transfer Bed to Chair/Chair to Bed:  with min assist  with mod assist Goal: Pt Will Ambulate Outcome: Progressing Flowsheets (Taken 04/28/2021 1007) Pt will Ambulate:  10 feet  with least restrictive assistive device  with minimal assist  with moderate assist Goal: Pt/caregiver will Perform Home Exercise Program Outcome: Progressing Flowsheets (Taken 04/28/2021 1007) Pt/caregiver will Perform Home Exercise Program:  For increased strengthening  For improved balance  With Supervision, verbal cues required/provided  10:08 AM, 04/28/21 Mearl Latin PT, DPT Physical Therapist at [facility]

## 2021-04-28 NOTE — Progress Notes (Addendum)
PROGRESS NOTE   DAMAIN BROADUS  BSJ:[id number] DOB: 10-16-1940 DOA: 04/27/2021 PCP: [name], MD   Chief Complaint  Patient presents with   Vomiting   Level of care: Med-Surg  Brief Admission History:  80 y.o. male, with history of cancer, CKD, coronary artery disease, diabetes mellitus type 2, hypertension, spontaneous pneumothorax, and more presents ED with chief complaint of sent by cardiology.  He had been complaining of dizziness.  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  He had been on Entresto and spironolactone but at this time he is not sure what he is taking and it seems very confused about his medications and general health.  Recently hospitalized at [facility] for right lower lobe pneumonia and hemoptysis.  Cardiology is recommending hospice and palliative care to get involved.  Requested work-up for acute dizziness.  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 - I'm not sure he needs the sodium tablets, especially with his cardiomyopathy, discontinue for now - reduce IV fluid today as he does have a dilated cardiomyopathy  Dehydration  - treated with gentle IV fluid - reduce rate further today - temporarily holding diuretics  Catheter associated UTI  - he was symptomatic and he is being treated with IV ceftriaxone  Dizziness - secondary to dehydration, hyponatremia and UTI - treating as above - PT evaluation for good measure  Type 2 diabetes mellitus  - continue SSI coverage and CBG monitoring - holding home oral  medications - he may not need to be started back on home glimepiride due to high risk of hypoglycemia  AKI  - prerenal and responding well to IV fluid therapy  Anemia in neoplastic disease - Hg holding stable - no s/s of GI bleeding - Dr. Delton Coombes planning to check ferritin and iron panel at next visit   Left leg weakness  - MRI ordered on admission is still pending  - CT head no acute findings - PT evaluation pending  Urinary retention  - had seen Dr. Felipa Eth recently 04/02/21 and plan was for him to continue foley for another 2 weeks and return to office for foley removal and voiding trial. - continue tamsulosin   Metastatic lung cancer - followed by Dr. Delton Coombes - he is being treated with dabrafenib 100 mg twice daily and trametinib 1 mg daily.  - he is due to follow with Dr. Raliegh Ip this months for repeat CT chest and labs   DVT prophylaxis: SQ heparin/SCDs Code Status: DNR  Family Communication: discussed care plan with patient  Disposition: home  Status is: Inpatient  Remains inpatient appropriate because: Iv antibiotics and Iv fluid required  Consultants:  Palliative care   Procedures:  N/a   Antimicrobials:  Ceftriaxone 12/12>>   Subjective: Pt says he was feeling very weak and dizzy yesterday but symptoms are starting to get better.  He was having burning with urination yesterday.  He denies chest pain and shortness of breath.    Objective: Vitals:   04/27/21 2038 04/27/21 2051 04/28/21 0112 04/28/21 0529  BP:  Marland Kitchen)  120/58 116/67 (!) 115/49  Pulse: (!) 55 (!) 54 (!) 59 64  Resp: 18 20  19   Temp:  98.6 F (37 C) 97.7 F (36.5 C) (!) 97.5 F (36.4 C)  TempSrc:    Oral  SpO2: 99% 100% 98% 98%  Weight:  69.1 kg    Height:  5' 10.98" (1.803 m)      Intake/Output Summary (Last 24 hours) at 04/28/2021 1019 Last data filed at 04/28/2021 0900 Gross per 24 hour  Intake 1632.03 ml  Output 700 ml  Net 932.03 ml   Filed Weights   04/27/21 2051  Weight:  69.1 kg    Examination:  General exam: elderly frail gentleman, awake, alert, cooperative. Appears calm and comfortable  Respiratory system: no increased work of breathing. Respiratory effort normal. Cardiovascular system: normal S1 & S2 heard. S3 heard. No rubs, gallops or clicks. Trace pedal edema. Gastrointestinal system: Abdomen is nondistended, soft and nontender. No organomegaly or masses felt. Normal bowel sounds heard. Central nervous system: Alert and oriented. No focal neurological deficits. Extremities: Symmetric 5 x 5 power. Skin: No rashes, lesions or ulcers Psychiatry: Judgement and insight appear normal. Mood & affect appropriate.   Data Reviewed: I have personally reviewed following labs and imaging studies  CBC: Recent Labs  Lab 04/27/21 1407 04/28/21 0520  WBC 14.9* 7.4  NEUTROABS  --  5.3  HGB 11.4* 9.6*  HCT 34.3* 29.4*  MCV 80.3 78.6*  PLT 313 604    Basic Metabolic Panel: Recent Labs  Lab 04/27/21 1407 04/28/21 0520  NA 125* 129*  K 3.7 3.8  CL 94* 97*  CO2 26 26  GLUCOSE 205* 193*  BUN 19 18  CREATININE 1.45* 1.17  CALCIUM 7.9* 7.9*  MG  --  1.7    GFR: Estimated Creatinine Clearance: 48.4 mL/min (by C-G formula based on SCr of 1.17 mg/dL).  Liver Function Tests: Recent Labs  Lab 04/28/21 0520  AST 13*  ALT 9  ALKPHOS 93  BILITOT 0.4  PROT 6.4*  ALBUMIN 2.1*    CBG: Recent Labs  Lab 04/27/21 1411 04/27/21 2053 04/28/21 0700  GLUCAP 184* 138* 178*    Recent Results (from the past 240 hour(s))  Resp Panel by RT-PCR (Flu A&B, Covid) Nasopharyngeal Swab     Status: None   Collection Time: 04/27/21  7:01 PM   Specimen: Nasopharyngeal Swab; Nasopharyngeal(NP) swabs in vial transport medium  Result Value Ref Range Status   SARS Coronavirus 2 by RT PCR NEGATIVE NEGATIVE Final    Comment: (NOTE) SARS-CoV-2 target nucleic acids are NOT DETECTED.  The SARS-CoV-2 RNA is generally detectable in upper respiratory specimens during  the acute phase of infection. The lowest concentration of SARS-CoV-2 viral copies this assay can detect is 138 copies/mL. A negative result does not preclude SARS-Cov-2 infection and should not be used as the sole basis for treatment or other patient management decisions. A negative result may occur with  improper specimen collection/handling, submission of specimen other than nasopharyngeal swab, presence of viral mutation(s) within the areas targeted by this assay, and inadequate number of viral copies(<138 copies/mL). A negative result must be combined with clinical observations, patient history, and epidemiological information. The expected result is Negative.  Fact Sheet for Patients:  EntrepreneurPulse.com.au  Fact Sheet for Healthcare Providers:  IncredibleEmployment.be  This test is no t yet approved or cleared by the [place] FDA and  has been authorized for detection and/or diagnosis of SARS-CoV-2 by FDA under an Emergency Use  Authorization (EUA). This EUA will remain  in effect (meaning this test can be used) for the duration of the COVID-19 declaration under Section 564(b)(1) of the Act, 21 U.S.C.section 360bbb-3(b)(1), unless the authorization is terminated  or revoked sooner.       Influenza A by PCR NEGATIVE NEGATIVE Final   Influenza B by PCR NEGATIVE NEGATIVE Final    Comment: (NOTE) The Xpert Xpress SARS-CoV-2/FLU/RSV plus assay is intended as an aid in the diagnosis of influenza from Nasopharyngeal swab specimens and should not be used as a sole basis for treatment. Nasal washings and aspirates are unacceptable for Xpert Xpress SARS-CoV-2/FLU/RSV testing.  Fact Sheet for Patients: EntrepreneurPulse.com.au  Fact Sheet for Healthcare Providers: IncredibleEmployment.be  This test is not yet approved or cleared by the [place] FDA and has been authorized for detection and/or  diagnosis of SARS-CoV-2 by FDA under an Emergency Use Authorization (EUA). This EUA will remain in effect (meaning this test can be used) for the duration of the COVID-19 declaration under Section 564(b)(1) of the Act, 21 U.S.C. section 360bbb-3(b)(1), unless the authorization is terminated or revoked.  Performed at [facility], [address] [place], [place] [address]      Radiology Studies: CT Head Wo Contrast  Result Date: 04/27/2021 CLINICAL DATA:  And 80 year old male presents for evaluation of headache with worsening symptoms. EXAM: CT HEAD WITHOUT CONTRAST TECHNIQUE: Contiguous axial images were obtained from the base of the skull through the vertex without intravenous contrast. COMPARISON:  Comparison is made with an MRI of the brain from 2019. FINDINGS: Brain: No evidence of acute infarction, hemorrhage, hydrocephalus, extra-axial collection or mass lesion/mass effect. Signs of atrophy and chronic microvascular ischemic change as before. Vascular: No hyperdense vessel or unexpected calcification. Skull: Normal. Negative for fracture or focal lesion. Sinuses/Orbits: Visualized paranasal sinuses and orbits are unremarkable to the extent evaluated. Other: None IMPRESSION: No acute intracranial pathology. Signs of atrophy and chronic microvascular ischemic change as before. Electronically Signed   By: Zetta Bills M.D.   On: 04/27/2021 18:09   CT LUMBAR SPINE WO CONTRAST  Result Date: 04/28/2021 CLINICAL DATA:  Lumbar radiculopathy with infection suspected EXAM: CT LUMBAR SPINE WITHOUT CONTRAST TECHNIQUE: Multidetector CT imaging of the lumbar spine was performed without intravenous contrast administration. Multiplanar CT image reconstructions were also generated. COMPARISON:  None similar FINDINGS: Segmentation: 5 lumbar type vertebrae Alignment: Negative for listhesis.  Mild levocurvature. Vertebrae: No acute fracture or focal pathologic process. Paraspinal and other soft tissues:  Extensive atheromatous calcification of the aorta and iliacs. Disc levels: Ordinary lumbar disc narrowing and bulging with endplate spurring. Milder facet spurring. No evidence of inflammation or neural impingement. IMPRESSION: No acute finding including evidence of infection. Mild for age lumbar spine degeneration. Electronically Signed   By: Jorje Guild M.D.   On: 04/28/2021 04:03   CT Abdomen Pelvis W Contrast  Result Date: 04/27/2021 CLINICAL DATA:  Abdominal pain, acute, nonlocalized. Dizziness, vomiting EXAM: CT ABDOMEN AND PELVIS WITH CONTRAST TECHNIQUE: Multidetector CT imaging of the abdomen and pelvis was performed using the standard protocol following bolus administration of intravenous contrast. CONTRAST:  11mL OMNIPAQUE IOHEXOL 300 MG/ML  SOLN COMPARISON:  03/22/2021 FINDINGS: Lower chest: Previously seen bilateral effusions have resolved. Numerous small nodules throughout the lungs bilaterally compatible with metastatic disease. These are stable since prior study. Coronary artery and aortic calcifications. Hepatobiliary: No focal liver abnormality is seen. Status post cholecystectomy. No biliary dilatation. Pancreas: No focal abnormality or ductal dilatation. Spleen: No focal abnormality.  Normal size. Adrenals/Urinary Tract: Areas of cortical thinning in the kidneys bilaterally. No hydronephrosis. No renal or ureteral stones. Adrenal glands unremarkable. Foley catheter in place with bladder decompressed. Stomach/Bowel: Stomach, large and small bowel grossly unremarkable. Vascular/Lymphatic: Heavily calcified aorta and iliac vessels. 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 No aneurysm. No adenopathy. Reproductive: No visible focal abnormality. Other: No free fluid or free air. Musculoskeletal: No acute bony abnormality. IMPRESSION: Numerous small subcentimeter pulmonary nodules in the visualized lower lungs compatible with metastases, similar to prior study.  Resolution of previously seen effusions and lower lobe airspace disease. Coronary artery disease, aortic atherosclerosis. 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 No acute findings in the abdomen or pelvis. Electronically Signed   By: Rolm Baptise M.D.   On: 04/27/2021 18:14   DG Chest Port 1 View  Result Date: 04/27/2021 CLINICAL DATA:  Weakness and dizziness. History of metastatic lung cancer. EXAM: PORTABLE CHEST 1 VIEW COMPARISON:  Chest radiograph 03/20/2021.  Chest CT 09/08/2020. 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 The cardiomediastinal silhouette is within normal limits. Aortic atherosclerosis is noted. Small widespread nodules are again seen throughout both lungs. Reticulonodular densities in both lung bases have mildly regressed from the prior study. No acute airspace consolidation, overt pulmonary edema, sizable pleural effusion, or pneumothorax is identified. IMPRESSION: Mildly regressed reticulonodular densities in both lung bases which may reflect resolving infection superimposed on metastatic lung nodules. No new abnormality identified. Electronically Signed   By: Logan Bores M.D.   On: 04/27/2021 15:33    Scheduled Meds:  aspirin EC  81 mg Oral Daily   atorvastatin  80 mg Oral Daily   Chlorhexidine Gluconate Cloth  6 each Topical Daily   escitalopram  10 mg Oral Daily   feeding supplement  237 mL Oral BID BM   heparin  5,000 Units Subcutaneous Q8H   insulin aspart  0-15 Units Subcutaneous TID WC   insulin aspart  0-5 Units Subcutaneous QHS   isosorbide mononitrate  30 mg Oral Daily   metoprolol succinate  100 mg Oral Daily   ondansetron (ZOFRAN) IV  4 mg Intravenous Once   ranolazine  500 mg Oral BID   tamsulosin  0.4 mg Oral QPC supper   Continuous Infusions:  sodium chloride 50 mL/hr at 04/28/21 0836   cefTRIAXone (ROCEPHIN)  IV       LOS: 0 days   Time spent: 36 mins   Mega Kinkade Wynetta Emery, MD How to contact the [facility] Attending or  Consulting provider [place] or covering provider during after hours [place], for this patient?  Check the care team in [facility] and look for a) attending/consulting [facility] provider listed and b) the [facility] team listed Log into [email or web address] and use [place]'s universal password to access. If you do not have the password, please contact the hospital operator. Locate the [facility] provider you are looking for under [facility] and page to a number that you can be directly reached. If you still have difficulty reaching the provider, please page the [facility] (Director on Call) for the Hospitalists listed on amion for assistance.  04/28/2021, 10:19 AM

## 2021-04-28 NOTE — Consult Note (Signed)
Palliative Care Consult Note                                  Date: 04/28/2021   Patient Name: [name]  DOB: Sep 03, 1940  MRN: [id number]  Age / Sex: 80 y.o., male  PCP: [name], MD Referring Physician: Murlean Iba, MD  Reason for Consultation: Establishing goals of care  HPI/Patient Profile: 80 y.o. male  with past medical history of cancer, CKD, coronary artery disease, diabetes mellitus type 2, hypertension, spontaneous pneumothorax, and more presents ED with chief complaint of sent by cardiology.  He had been complaining of dizziness.  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  He had been on Entresto and spironolactone but at this time he is not sure what he is taking and it seems very confused about his medications and general health.  Recently hospitalized at [facility] for right lower lobe pneumonia and hemoptysis.  Cardiology is recommending hospice and palliative care to get involved.  Requested work-up for acute dizziness. He was admitted on 04/27/2021 with UTI, dizziness, hyponatremia, AKI, dehydration.   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 Dizziness felt multifactorial d/t dehydration, hyponatremia, and UTI. Sees Dr. Raliegh Ip in oncology with planned follow-up this month.  PMT was consulted for [place] conversation.  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  I met with the patient at the bedside.  He is awake, alert, oriented..   Concept of Palliative Care was introduced as specialized medical care for people and their families living with serious illness.  If focuses on providing relief from the symptoms and stress of a serious illness.  The goal is to improve quality of life for both the patient and the family. Values and goals of care important to patient and family were attempted to be elicited.  Created space and opportunity for patient  and family to explore thoughts and feelings regarding current medical situation   Natural trajectory and current clinical status were discussed. Questions and concerns addressed. Patient  encouraged to call with questions or concerns.    Patient/Family Understanding of Illness: He understands his health is poor.  He states he has lung cancer that has been in remission for a few years but is on current oral chemotherapy to maintain remission.  He notes his heart is weak.  He states he previously had an MI and open heart surgery, had a fall where he had stay at rehab ("I will never go back there again").  We reviewed his MRI results suspicious for possible glioma, though not certain.  Life Review: He lives with his wife and has been married for 60 years.  He started working in 1958 and has done everything.  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  He worked until he was "69-something".  Patient Values: Family, independence, happiness  Goals: To get better from his current illness and get home to his wife.  He plans to take it a day at a time with his follow-up regarding cancer.  He notes that he does not want to ever go back to a skilled nursing facility  Today's Discussion: Today we had an extensive discussion reviewing his current health situation and the realities around this.  We  discussed his known history of lung cancer.  He states she smoked for many years but thought he quit early enough but "apparently not".  He has been doing well with his oral chemotherapy and states he has been in remission for few years.  He has scheduled oncology follow-up today.  I reviewed his MRI results with him that are suspicious for possible brain cancer, though nothing is definite and really requires oncology input.  He states that he feels great now, especially compared to when he came in.  He does note that he is recently had a poor appetite and has lost about 20 pounds in the last 2 months.  He tries to drink enough fluids but may not.  His function has been okay but mildly declining recently.  Overall, he is independent with some support from his wife.  Only talked about the possibility of cancers he states that he would not want aggressive chemotherapy.  He states "I am 33 and I have lived a good life, if there is something new I will just leave it alone."  I provided emotional general support therapeutic listening, empathy, reminiscence, sharing of stories.  I answered all questions and addressed all concerns to the best my ability.  Review of Systems  Constitutional:  Positive for activity change, fatigue and unexpected weight change.       Feels better overall  Respiratory:  Negative for shortness of breath.   Cardiovascular:  Negative for chest pain.  Gastrointestinal:  Negative for abdominal pain, nausea and vomiting.   Objective:   Primary Diagnoses: Present on Admission:  UTI (urinary tract infection)  Hyponatremia  AKI (acute kidney injury) ([place])  Dehydration   Physical Exam Vitals and nursing note reviewed.  Constitutional:      General: He is not in acute distress.    Appearance: He is ill-appearing. He is not toxic-appearing.  HENT:     Head: Normocephalic and atraumatic.  Cardiovascular:     Rate and Rhythm: Normal rate.  Pulmonary:     Effort: Pulmonary  effort is normal. No respiratory distress.     Breath sounds: No wheezing or rhonchi.  Abdominal:     General: Abdomen is flat.     Palpations: Abdomen is soft.  Skin:    General: Skin is warm and dry.  Neurological:     General: No focal deficit present.     Mental Status: He is alert.  Psychiatric:        Mood and Affect: Mood normal.        Behavior: Behavior normal.    Vital Signs:  BP 115/62 (BP Location: Left Arm)    Pulse 60    Temp 97.6 F (36.4 C) (Oral)    Resp 18    Ht 5' 10.98" (1.803 m)    Wt 69.1 kg    SpO2 98%    BMI 21.26 kg/m  Palliative Assessment/Data: 50%    Advanced Care Planning:   Primary Decision Maker: PATIENT  Code Status/Advance Care Planning: DNR  A discussion was had today regarding advanced directives. Concepts specific to code status, artifical feeding and hydration, continued IV antibiotics and rehospitalization was had.  The difference between a aggressive medical intervention path and a palliative comfort care path for this patient at this time was had.  He states he likely would not want any aggressive chemotherapy, aggressive interventions.  Decisions/Changes to ACP: None today  Assessment & Plan:   Impression: 80 year old male with multiple chronic illnesses as described above including lung cancer currently in remission on ongoing cancer treatment with Dr. Raliegh Ip.  He was referred to the emergency department by cardiology given his history of cardiomyopathy with reduced EF and complaints of persistent dizziness.  MRI of the brain showed concerns for possible low-grade glioma, though requires further imaging and follow-up with oncology.  He does have an oncology visit upcoming.  Overall, related to his acute illness presentation he is feeling much better and the hospitalist indicates he will likely go home tomorrow.  Overall I feel his long-term prognosis is likely poor given all his comorbid burdens and the possibility of new brain cancer in  addition to his lung cancer.  He will require further goals of care discussion after oncology input.  SUMMARY OF RECOMMENDATIONS   Continue DNR Continue current treatments, treat the treatable Plan for outpatient palliative care follow-up for further [place] discussions PMT will continue to monitor for any new needs  Symptom Management:  Per primary team PMT is available to assist as needed  Prognosis:  Unable to determine  Discharge Planning:  To Be Determined   Discussed with: Medical team, nursing team, [facility] team, patient    Thank you for allowing [place] to participate in the care of SAW MENDENHALL PMT will continue to support holistically.  Time Total: 70 min  Greater than 50%  of this time was spent counseling and coordinating care related to the above assessment and plan.  Signed by: Walden Field, NP Palliative Medicine Team  Team Phone # [phone] (Nights/Weekends)  04/28/2021, 3:44 PM

## 2021-04-28 NOTE — Plan of Care (Signed)
°  Problem: Education: Goal: Knowledge of General Education information will improve Description: Including pain rating scale, medication(s)/side effects and non-pharmacologic comfort measures Outcome: Progressing   Problem: Health Behavior/Discharge Planning: Goal: Ability to manage health-related needs will improve Outcome: Not Progressing   Problem: Clinical Measurements: Goal: Ability to maintain clinical measurements within normal limits will improve Outcome: Progressing Goal: Will remain free from infection Outcome: Progressing Goal: Diagnostic test results will improve Outcome: Progressing Goal: Respiratory complications will improve Outcome: Progressing Goal: Cardiovascular complication will be avoided Outcome: Progressing   Problem: Activity: Goal: Risk for activity intolerance will decrease Outcome: Not Progressing   Problem: Nutrition: Goal: Adequate nutrition will be maintained Outcome: Not Progressing   Problem: Coping: Goal: Level of anxiety will decrease Outcome: Progressing   Problem: Elimination: Goal: Will not experience complications related to bowel motility Outcome: Progressing Goal: Will not experience complications related to urinary retention Outcome: Progressing   Problem: Pain Managment: Goal: General experience of comfort will improve Outcome: Progressing   Problem: Safety: Goal: Ability to remain free from injury will improve Outcome: Progressing   Problem: Skin Integrity: Goal: Risk for impaired skin integrity will decrease Outcome: Not Progressing

## 2021-04-29 LAB — BASIC METABOLIC PANEL
Anion gap: 7 (ref 5–15)
BUN: 13 mg/dL (ref 8–23)
CO2: 24 mmol/L (ref 22–32)
Calcium: 7.8 mg/dL — ABNORMAL LOW (ref 8.9–10.3)
Chloride: 99 mmol/L (ref 98–111)
Creatinine, Ser: 1.05 mg/dL (ref 0.61–1.24)
GFR, Estimated: >60 mL/min (ref >60)
Glucose, Bld: 180 mg/dL — ABNORMAL HIGH (ref 70–99)
Potassium: 3.5 mmol/L (ref 3.5–5.1)
Sodium: 130 mmol/L — ABNORMAL LOW (ref 135–145)

## 2021-04-29 LAB — MAGNESIUM: Magnesium: 1.7 mg/dL (ref 1.7–2.4)

## 2021-04-29 LAB — CBC
HCT: 27.4 % — ABNORMAL LOW (ref 39.0–52.0)
Hemoglobin: 8.9 g/dL — ABNORMAL LOW (ref 13.0–17.0)
MCH: 25.8 pg — ABNORMAL LOW (ref 26.0–34.0)
MCHC: 32.5 g/dL (ref 30.0–36.0)
MCV: 79.4 fL — ABNORMAL LOW (ref 80.0–100.0)
Platelets: 209 10*3/uL (ref 150–400)
RBC: 3.45 MIL/uL — ABNORMAL LOW (ref 4.22–5.81)
RDW: 18.4 % — ABNORMAL HIGH (ref 11.5–15.5)
WBC: 6.4 10*3/uL (ref 4.0–10.5)
nRBC: 0 % (ref 0.0–0.2)

## 2021-04-29 LAB — GLUCOSE, CAPILLARY
Glucose-Capillary: 171 mg/dL — ABNORMAL HIGH (ref 70–99)
Glucose-Capillary: 174 mg/dL — ABNORMAL HIGH (ref 70–99)

## 2021-04-29 MED ORDER — ENSURE ENLIVE PO LIQD: 237.0000 mL | Freq: Two times a day (BID) | ORAL | 12 refills | Status: AC

## 2021-04-29 MED ORDER — FOSFOMYCIN TROMETHAMINE 3 G PO PACK
3.0000 g | PACK | Freq: Once | ORAL | Status: AC
Start: 2021-04-29 — End: 2021-04-29
  Administered 2021-04-29: 13:00:00 3 g via ORAL
  Filled 2021-04-29: qty 3

## 2021-04-29 NOTE — TOC Transition Note (Signed)
Transition of Care [facility]) - CM/SW Discharge Note   Patient Details  Name: [name] MRN: [id number] Date of Birth: 1941/02/13  [facility] CM/SW Contact:  Ihor Gully, LCSW Phone Number: 04/29/2021, 1:03 PM   Clinical Narrative:   Patient active with [facility]. [name] with [facility] notified of d/c and [place] orders. [name] is agreeable to palliative services with [facility]. Referral made.    Final next level of care: [place] Barriers to Discharge: No Barriers Identified   Patient Goals and CMS Choice        Discharge Placement                       Discharge Plan and Services                                     Social Determinants of Health (SDOH) Interventions     Readmission Risk Interventions Readmission Risk Prevention Plan 07/17/2020  Transportation Screening Complete  PCP or Specialist Appt within 5-7 Days Not Complete  Not Complete comments Patient discharging to SNF  Home Care Screening Complete  Medication Review (RN CM) Complete  Some recent data might be hidden

## 2021-04-29 NOTE — Discharge Summary (Signed)
Physician Discharge Summary  [name] OEH:[id number] DOB: [birth year] DOA: 04/27/2021  PCP: [name], MD  Admit date: 04/27/2021  Discharge date: 04/29/2021  Admitted From:Home  Disposition:  Home  Recommendations for Outpatient Follow-up:  Follow up with PCP in 1-2 weeks Follow-up with urology Dr. Felipa Eth as scheduled for voiding trials and removal of Foley catheter Follow-up with Dr. 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 male, with history of cancer, CKD, coronary artery disease, diabetes mellitus type 2, hypertension, spontaneous pneumothorax, and more presents ED with chief complaint of some ongoing dizziness and postprandial emesis.  He recently had NSTEMI as well as bacteremia prompting a TEE.  He was also recently hospitalized at [facility] at the end of November due to right lower lobe pneumonia and hemoptysis. 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  He had been on Entresto and spironolactone but at this time he is not sure what he is taking and it seems very confused about his medications and general health.  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  He was also noted to have some catheter associated UTI and was started on Rocephin for treatment.  He was hydrated with IV fluid and his dizziness subsequently improved as well as his hyponatremia to a certain extent, although this will remain chronic for him.  He has been given a dose of fosfomycin prior to discharge with urine culture still pending but  has also received 2 days of Rocephin.  He has no other symptomatology related to his UTI.  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  This was addressed by palliative care during the course of the admission.  He also has minimal left lower extremity weakness that was seen and evaluated by physical therapy with recommendations for home health therapy.  He is currently being followed by his oncologist Dr. Delton Coombes whom he will be seeing within the next month for further follow-up care regarding his lung cancer and he plans to discuss his potential glioma with him at that point, but overall does not seem to want to do much about this.  He is currently in stable condition for discharge and will have outpatient palliative follow-up.  Discharge Diagnoses:  Principal Problem:   UTI (urinary tract infection) Active Problems:   Type 2 diabetes mellitus without complication, without long-term current use of insulin (HCC)   Hyponatremia   Dizziness   AKI (acute kidney injury) ([place])   Dehydration  Principal discharge diagnosis: AKI with hyponatremia in the setting of catheter associated UTI and also with findings of new glioma.  Discharge Instructions  Discharge Instructions     Diet - low sodium heart healthy   Complete by: As directed    Increase activity slowly   Complete by: As directed       Allergies as of 04/29/2021       Reactions   Oxycodone         Medication List     STOP  taking these medications    apixaban 5 MG Tabs tablet Commonly known as: ELIQUIS   ipratropium 0.02 % nebulizer solution Commonly known as: ATROVENT   levalbuterol 0.63 MG/3ML nebulizer solution Commonly known as: XOPENEX   prochlorperazine 10 MG tablet Commonly known as: COMPAZINE   sacubitril-valsartan 24-26 MG Commonly known as: ENTRESTO   spironolactone 25 MG tablet Commonly known as:  ALDACTONE       TAKE these medications    alendronate 70 MG tablet Commonly known as: FOSAMAX Take 70 mg by mouth See admin instructions. Take 70 mg weekly every Sunday   amLODipine 10 MG tablet Commonly known as: NORVASC Take 10 mg by mouth daily.   aspirin EC 81 MG tablet Take 81 mg by mouth daily. Swallow whole.   atorvastatin 80 MG tablet Commonly known as: LIPITOR Take 1 tablet (80 mg total) by mouth daily.   benzonatate 200 MG capsule Commonly known as: TESSALON Take 200 mg by mouth 3 (three) times daily as needed for cough.   dabrafenib mesylate 50 MG capsule Commonly known as: Tafinlar TAKE 2 CAPSULES (100 MG TOTAL) BY MOUTH 2 (TWO) TIMES DAILY. TAKE ON AN EMPTY STOMACH 1 HOUR BEFORE OR 2 HOURS AFTER MEALS.   DOCUSATE SODIUM PO Take 50 mg by mouth daily.   escitalopram 10 MG tablet Commonly known as: LEXAPRO Take 10 mg by mouth daily.   feeding supplement Liqd Take 237 mLs by mouth 2 (two) times daily between meals.   glimepiride 1 MG tablet Commonly known as: AMARYL Take 1 tablet (1 mg total) by mouth daily with breakfast.   insulin aspart 100 UNIT/ML injection Commonly known as: novoLOG Inject 2-8 Units into the skin daily as needed for high blood sugar. SLIDING SCALE   isosorbide mononitrate 30 MG 24 hr tablet Commonly known as: IMDUR Take 30 mg by mouth daily.   meclizine 25 MG tablet Commonly known as: ANTIVERT Take 25 mg by mouth 3 (three) times daily as needed for dizziness.   metFORMIN 500 MG 24 hr tablet Commonly known as: GLUCOPHAGE-XR Take 1 tablet (500 mg total) by mouth 2 (two) times daily with a meal.   metoprolol succinate 25 MG 24 hr tablet Commonly known as: TOPROL-XL Take 1 tablet (25 mg total) by mouth daily. What changed: how much to take   nitroGLYCERIN 0.4 MG SL tablet Commonly known as: NITROSTAT Place 0.4 mg under the tongue every 5 (five) minutes as needed for chest pain.   ondansetron 4 MG tablet Commonly known  as: ZOFRAN Take 1 tablet (4 mg total) by mouth every 6 (six) hours as needed for nausea. What changed: Another medication with the same name was removed. Continue taking this medication, and follow the directions you see here.   ONE TOUCH ULTRA TEST test strip Generic drug: glucose blood   pioglitazone 45 MG tablet Commonly known as: ACTOS Take 45 mg by mouth daily.   polyethylene glycol 17 g packet Commonly known as: MIRALAX / GLYCOLAX Take 17 g by mouth daily as needed for mild constipation.   ranolazine 500 MG 12 hr tablet Commonly known as: RANEXA Take 1 tablet (500 mg total) by mouth 2 (two) times daily.   tamsulosin 0.4 MG Caps capsule Commonly known as: FLOMAX Take 1 capsule (0.4 mg total) by mouth daily after supper.   trametinib dimethyl sulfoxide 0.5 MG tablet Commonly known as: Mekinist TAKE 2 TABLETS (1 MG TOTAL) BY MOUTH DAILY. TAKE 1 HOUR BEFORE OR 2 HOURS AFTER A MEAL.  STORE REFRIGERATED IN ORIGINAL CONTAINER.   Vitamin D3 125 MCG (5000 UT) Caps Take 5,000 Units by mouth daily.        Follow-up Information     [name], MD. Schedule an appointment as soon as possible for a visit in 1 week(s).   Specialty: Internal Medicine Contact information: [address] [place] [address] [phone]         Primus Bravo., MD. Go to.   Specialty: Urology Contact information: [address] [facility] [address] [phone]         Derek Jack, MD Follow up.   Specialty: Hematology Contact information: [place] [address] [phone]                Allergies  Allergen Reactions   Oxycodone     Consultations: Palliative care   Procedures/Studies: CT Head Wo Contrast  Result Date: 04/27/2021 CLINICAL DATA:  And 80 year old male presents for evaluation of headache with worsening symptoms. EXAM: CT HEAD WITHOUT CONTRAST TECHNIQUE: Contiguous axial images were obtained from the  base of the skull through the vertex without intravenous contrast. COMPARISON:  Comparison is made with an MRI of the brain from 2019. FINDINGS: Brain: No evidence of acute infarction, hemorrhage, hydrocephalus, extra-axial collection or mass lesion/mass effect. Signs of atrophy and chronic microvascular ischemic change as before. Vascular: No hyperdense vessel or unexpected calcification. Skull: Normal. Negative for fracture or focal lesion. Sinuses/Orbits: Visualized paranasal sinuses and orbits are unremarkable to the extent evaluated. Other: None IMPRESSION: No acute intracranial pathology. Signs of atrophy and chronic microvascular ischemic change as before. Electronically Signed   By: Zetta Bills M.D.   On: 04/27/2021 18:09   CT LUMBAR SPINE WO CONTRAST  Result Date: 04/28/2021 CLINICAL DATA:  Lumbar radiculopathy with infection suspected EXAM: CT LUMBAR SPINE WITHOUT CONTRAST TECHNIQUE: Multidetector CT imaging of the lumbar spine was performed without intravenous contrast administration. Multiplanar CT image reconstructions were also generated. COMPARISON:  None similar FINDINGS: Segmentation: 5 lumbar type vertebrae Alignment: Negative for listhesis.  Mild levocurvature. Vertebrae: No acute fracture or focal pathologic process. Paraspinal and other soft tissues: Extensive atheromatous calcification of the aorta and iliacs. Disc levels: Ordinary lumbar disc narrowing and bulging with endplate spurring. Milder facet spurring. No evidence of inflammation or neural impingement. IMPRESSION: No acute finding including evidence of infection. Mild for age lumbar spine degeneration. Electronically Signed   By: Jorje Guild M.D.   On: 04/28/2021 04:03   MR BRAIN WO CONTRAST  Result Date: 04/28/2021 CLINICAL DATA:  Dizziness, persistent/recurrent, cardiac or vascular cause suspected EXAM: MRI HEAD WITHOUT CONTRAST TECHNIQUE: Multiplanar, multiecho pulse sequences of the brain and surrounding structures  were obtained without intravenous contrast. COMPARISON:  CT head 04/27/2021.  MRI September 01, 2017. FINDINGS: Brain: No acute infarction, hemorrhage, hydrocephalus, or extra-axial collection. Infiltrating and mildly expanded cortical T2/FLAIR hyperintensity involving the anterior left temporal lobe and left insula, similar versus slightly progressed in comparison to 2019 prior. Chronic atrophy. Additional patchy supratentorial and pontine white matter T2/FLAIR hyperintensity, nonspecific but compatible with chronic microvascular ischemic disease, similar. Vascular: Major arterial flow voids are maintained at the skull base. Skull and upper cervical spine: Normal marrow signal. Sinuses/Orbits: Mild paranasal sinus mucosal thickening. Unremarkable orbits. Other: No mastoid effusions. IMPRESSION: 1. Infiltrating and mildly expanded cortical T2/FLAIR hyperintensity involving the anterior left temporal lobe and left insula, similar versus slightly progressed in comparison to 2019 prior. 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  Sequela of prior insult is thought less likely given no associated volume loss/encephalomalacia. The chronicity of this finding makes encephalitis unlikely. Appearance is atypical for CADASIL given asymmetry, cortical involvement and patient age. Postcontrast imaging may be helpful to establish a baseline and evaluate for an enhancing component. 2. No acute infarct. 3. Atrophy and chronic microvascular ischemic disease. These results will be called to the ordering clinician or representative by the Radiologist Assistant, and communication documented in the PACS or [employer]. Electronically Signed   By: Margaretha Sheffield M.D.   On: 04/28/2021 10:13   CT Abdomen Pelvis W Contrast  Result Date: 04/27/2021 CLINICAL DATA:  Abdominal pain, acute, nonlocalized. Dizziness, vomiting EXAM: CT ABDOMEN AND PELVIS WITH CONTRAST TECHNIQUE: Multidetector CT imaging of the abdomen  and pelvis was performed using the standard protocol following bolus administration of intravenous contrast. CONTRAST:  18mL OMNIPAQUE IOHEXOL 300 MG/ML  SOLN COMPARISON:  03/22/2021 FINDINGS: Lower chest: Previously seen bilateral effusions have resolved. Numerous small nodules throughout the lungs bilaterally compatible with metastatic disease. These are stable since prior study. Coronary artery and aortic calcifications. Hepatobiliary: No focal liver abnormality is seen. Status post cholecystectomy. No biliary dilatation. Pancreas: No focal abnormality or ductal dilatation. Spleen: No focal abnormality.  Normal size. Adrenals/Urinary Tract: Areas of cortical thinning in the kidneys bilaterally. No hydronephrosis. No renal or ureteral stones. Adrenal glands unremarkable. Foley catheter in place with bladder decompressed. Stomach/Bowel: Stomach, large and small bowel grossly unremarkable. Vascular/Lymphatic: Heavily calcified aorta and iliac vessels. 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 No aneurysm. No adenopathy. Reproductive: No visible focal abnormality. Other: No free fluid or free air. Musculoskeletal: No acute bony abnormality. IMPRESSION: Numerous small subcentimeter pulmonary nodules in the visualized lower lungs compatible with metastases, similar to prior study. Resolution of previously seen effusions and lower lobe airspace disease. Coronary artery disease, aortic atherosclerosis. 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 No acute findings in the abdomen or pelvis. Electronically Signed   By: Rolm Baptise M.D.   On: 04/27/2021 18:14   DG Chest Port 1 View  Result Date: 04/27/2021 CLINICAL DATA:  Weakness and dizziness. History of metastatic lung cancer. EXAM: PORTABLE CHEST 1 VIEW COMPARISON:  Chest radiograph 03/20/2021.  Chest CT 09/08/2020. 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 The cardiomediastinal silhouette is within normal  limits. Aortic atherosclerosis is noted. Small widespread nodules are again seen throughout both lungs. Reticulonodular densities in both lung bases have mildly regressed from the prior study. No acute airspace consolidation, overt pulmonary edema, sizable pleural effusion, or pneumothorax is identified. IMPRESSION: Mildly regressed reticulonodular densities in both lung bases which may reflect resolving infection superimposed on metastatic lung nodules. No new abnormality identified. Electronically Signed   By: Logan Bores M.D.   On: 04/27/2021 15:33     Discharge Exam: Vitals:   04/29/21 0509 04/29/21 1007  BP: (!) 111/52 (!) 137/56  Pulse: 60 65  Resp: 19   Temp: 98.4 F (36.9 C)   SpO2: 97%    Vitals:   04/28/21 1407 04/28/21 2040 04/29/21 0509 04/29/21 1007  BP: 115/62 (!) 113/52 (!) 111/52 (!) 137/56  Pulse: 60 68 60 65  Resp: 18 20 19    Temp: 97.6 F (36.4 C) 97.6 F (36.4 C) 98.4 F (36.9 C)   TempSrc: Oral Oral Oral   SpO2: 98% 97% 97%   Weight:      Height:  General: Pt is alert, awake, not in acute distress Cardiovascular: RRR, S1/S2 +, no rubs, no gallops Respiratory: CTA bilaterally, no wheezing, no rhonchi Abdominal: Soft, NT, ND, bowel sounds + Extremities: no edema, no cyanosis Foley with clear, yellow urine output noted   The results of significant diagnostics from this hospitalization (including imaging, microbiology, ancillary and laboratory) are listed below for reference.     Microbiology: Recent Results (from the past 240 hour(s))  Resp Panel by RT-PCR (Flu A&B, Covid) Nasopharyngeal Swab     Status: None   Collection Time: 04/27/21  7:01 PM   Specimen: Nasopharyngeal Swab; Nasopharyngeal(NP) swabs in vial transport medium  Result Value Ref Range Status   SARS Coronavirus 2 by RT PCR NEGATIVE NEGATIVE Final    Comment: (NOTE) SARS-CoV-2 target nucleic acids are NOT DETECTED.  The SARS-CoV-2 RNA is generally detectable in upper  respiratory specimens during the acute phase of infection. The lowest concentration of SARS-CoV-2 viral copies this assay can detect is 138 copies/mL. A negative result does not preclude SARS-Cov-2 infection and should not be used as the sole basis for treatment or other patient management decisions. A negative result may occur with  improper specimen collection/handling, submission of specimen other than nasopharyngeal swab, presence of viral mutation(s) within the areas targeted by this assay, and inadequate number of viral copies(<138 copies/mL). A negative result must be combined with clinical observations, patient history, and epidemiological information. The expected result is Negative.  Fact Sheet for Patients:  EntrepreneurPulse.com.au  Fact Sheet for Healthcare Providers:  IncredibleEmployment.be  This test is no t yet approved or cleared by the [place] FDA and  has been authorized for detection and/or diagnosis of SARS-CoV-2 by FDA under an Emergency Use Authorization (EUA). This EUA will remain  in effect (meaning this test can be used) for the duration of the COVID-19 declaration under Section 564(b)(1) of the Act, 21 U.S.C.section 360bbb-3(b)(1), unless the authorization is terminated  or revoked sooner.       Influenza A by PCR NEGATIVE NEGATIVE Final   Influenza B by PCR NEGATIVE NEGATIVE Final    Comment: (NOTE) The Xpert Xpress SARS-CoV-2/FLU/RSV plus assay is intended as an aid in the diagnosis of influenza from Nasopharyngeal swab specimens and should not be used as a sole basis for treatment. Nasal washings and aspirates are unacceptable for Xpert Xpress SARS-CoV-2/FLU/RSV testing.  Fact Sheet for Patients: EntrepreneurPulse.com.au  Fact Sheet for Healthcare Providers: IncredibleEmployment.be  This test is not yet approved or cleared by the [place] FDA and has been  authorized for detection and/or diagnosis of SARS-CoV-2 by FDA under an Emergency Use Authorization (EUA). This EUA will remain in effect (meaning this test can be used) for the duration of the COVID-19 declaration under Section 564(b)(1) of the Act, 21 U.S.C. section 360bbb-3(b)(1), unless the authorization is terminated or revoked.  Performed at [facility], [address], [place], [place] [address]      Labs: BNP (last 3 results) No results for input(s): BNP in the last 8760 hours. Basic Metabolic Panel: Recent Labs  Lab 04/27/21 1407 04/28/21 0520 04/29/21 0522  NA 125* 129* 130*  K 3.7 3.8 3.5  CL 94* 97* 99  CO2 26 26 24   GLUCOSE 205* 193* 180*  BUN 19 18 13   CREATININE 1.45* 1.17 1.05  CALCIUM 7.9* 7.9* 7.8*  MG  --  1.7 1.7   Liver Function Tests: Recent Labs  Lab 04/28/21 0520  AST 13*  ALT 9  ALKPHOS 93  BILITOT 0.4  PROT 6.4*  ALBUMIN 2.1*   No results for input(s): LIPASE, AMYLASE in the last 168 hours. No results for input(s): AMMONIA in the last 168 hours. CBC: Recent Labs  Lab 04/27/21 1407 04/28/21 0520 04/29/21 0522  WBC 14.9* 7.4 6.4  NEUTROABS  --  5.3  --   HGB 11.4* 9.6* 8.9*  HCT 34.3* 29.4* 27.4*  MCV 80.3 78.6* 79.4*  PLT 313 217 209   Cardiac Enzymes: No results for input(s): CKTOTAL, CKMB, CKMBINDEX, TROPONINI in the last 168 hours. BNP: Invalid input(s): POCBNP CBG: Recent Labs  Lab 04/28/21 0700 04/28/21 1120 04/28/21 1611 04/28/21 2042 04/29/21 0736  GLUCAP 178* 187* 218* 192* 171*   D-Dimer No results for input(s): DDIMER in the last 72 hours. Hgb A1c No results for input(s): HGBA1C in the last 72 hours. Lipid Profile No results for input(s): CHOL, HDL, LDLCALC, TRIG, CHOLHDL, LDLDIRECT in the last 72 hours. Thyroid function studies No results for input(s): TSH, T4TOTAL, T3FREE, THYROIDAB in the last 72 hours.  Invalid input(s): FREET3 Anemia work up No results for input(s): VITAMINB12, FOLATE, FERRITIN,  TIBC, IRON, RETICCTPCT in the last 72 hours. Urinalysis    Component Value Date/Time   COLORURINE YELLOW 04/27/2021 1757   APPEARANCEUR CLOUDY (A) 04/27/2021 1757   LABSPEC 1.033 (H) 04/27/2021 1757   PHURINE 6.0 04/27/2021 1757   GLUCOSEU >=500 (A) 04/27/2021 1757   HGBUR SMALL (A) 04/27/2021 1757   BILIRUBINUR NEGATIVE 04/27/2021 1757   KETONESUR 20 (A) 04/27/2021 1757   PROTEINUR 30 (A) 04/27/2021 1757   UROBILINOGEN 0.2 05/05/2007 1658   NITRITE POSITIVE (A) 04/27/2021 1757   LEUKOCYTESUR LARGE (A) 04/27/2021 1757   Sepsis Labs Invalid input(s): PROCALCITONIN,  WBC,  LACTICIDVEN Microbiology Recent Results (from the past 240 hour(s))  Resp Panel by RT-PCR (Flu A&B, Covid) Nasopharyngeal Swab     Status: None   Collection Time: 04/27/21  7:01 PM   Specimen: Nasopharyngeal Swab; Nasopharyngeal(NP) swabs in vial transport medium  Result Value Ref Range Status   SARS Coronavirus 2 by RT PCR NEGATIVE NEGATIVE Final    Comment: (NOTE) SARS-CoV-2 target nucleic acids are NOT DETECTED.  The SARS-CoV-2 RNA is generally detectable in upper respiratory specimens during the acute phase of infection. The lowest concentration of SARS-CoV-2 viral copies this assay can detect is 138 copies/mL. A negative result does not preclude SARS-Cov-2 infection and should not be used as the sole basis for treatment or other patient management decisions. A negative result may occur with  improper specimen collection/handling, submission of specimen other than nasopharyngeal swab, presence of viral mutation(s) within the areas targeted by this assay, and inadequate number of viral copies(<138 copies/mL). A negative result must be combined with clinical observations, patient history, and epidemiological information. The expected result is Negative.  Fact Sheet for Patients:  EntrepreneurPulse.com.au  Fact Sheet for Healthcare Providers:   IncredibleEmployment.be  This test is no t yet approved or cleared by the [place] FDA and  has been authorized for detection and/or diagnosis of SARS-CoV-2 by FDA under an Emergency Use Authorization (EUA). This EUA will remain  in effect (meaning this test can be used) for the duration of the COVID-19 declaration under Section 564(b)(1) of the Act, 21 U.S.C.section 360bbb-3(b)(1), unless the authorization is terminated  or revoked sooner.       Influenza A by PCR NEGATIVE NEGATIVE Final   Influenza B by PCR NEGATIVE NEGATIVE Final    Comment: (NOTE) The Xpert Xpress SARS-CoV-2/FLU/RSV plus assay  is intended as an aid in the diagnosis of influenza from Nasopharyngeal swab specimens and should not be used as a sole basis for treatment. Nasal washings and aspirates are unacceptable for Xpert Xpress SARS-CoV-2/FLU/RSV testing.  Fact Sheet for Patients: EntrepreneurPulse.com.au  Fact Sheet for Healthcare Providers: IncredibleEmployment.be  This test is not yet approved or cleared by the [place] FDA and has been authorized for detection and/or diagnosis of SARS-CoV-2 by FDA under an Emergency Use Authorization (EUA). This EUA will remain in effect (meaning this test can be used) for the duration of the COVID-19 declaration under Section 564(b)(1) of the Act, 21 U.S.C. section 360bbb-3(b)(1), unless the authorization is terminated or revoked.  Performed at [facility], [address] [place], [place] [address]      Time coordinating discharge: 35 minutes  SIGNED:   Rodena Goldmann, DO [facility] 04/29/2021, 10:53 AM  If 7PM-7AM, please contact night-coverage [email or web address]

## 2021-04-30 DIAGNOSIS — I213 ST elevation (STEMI) myocardial infarction of unspecified site: Secondary | ICD-10-CM | POA: Diagnosis not present

## 2021-04-30 DIAGNOSIS — E1122 Type 2 diabetes mellitus with diabetic chronic kidney disease: Secondary | ICD-10-CM | POA: Diagnosis not present

## 2021-04-30 DIAGNOSIS — E1159 Type 2 diabetes mellitus with other circulatory complications: Secondary | ICD-10-CM | POA: Diagnosis not present

## 2021-04-30 DIAGNOSIS — N183 Chronic kidney disease, stage 3 unspecified: Secondary | ICD-10-CM | POA: Diagnosis not present

## 2021-04-30 DIAGNOSIS — J9601 Acute respiratory failure with hypoxia: Secondary | ICD-10-CM | POA: Diagnosis not present

## 2021-04-30 DIAGNOSIS — I5022 Chronic systolic (congestive) heart failure: Secondary | ICD-10-CM | POA: Diagnosis not present

## 2021-04-30 DIAGNOSIS — M6281 Muscle weakness (generalized): Secondary | ICD-10-CM | POA: Diagnosis not present

## 2021-04-30 DIAGNOSIS — R2689 Other abnormalities of gait and mobility: Secondary | ICD-10-CM | POA: Diagnosis not present

## 2021-04-30 DIAGNOSIS — I129 Hypertensive chronic kidney disease with stage 1 through stage 4 chronic kidney disease, or unspecified chronic kidney disease: Secondary | ICD-10-CM | POA: Diagnosis not present

## 2021-04-30 DIAGNOSIS — R339 Retention of urine, unspecified: Secondary | ICD-10-CM | POA: Diagnosis not present

## 2021-04-30 LAB — URINE CULTURE: Culture: >=100000 — AB

## 2021-05-01 ENCOUNTER — Telehealth: Payer: Self-pay

## 2021-05-01 NOTE — Telephone Encounter (Signed)
[facility] nurse [facility]) called and received orders to change catheter q 4 weeks until patient is seen in office for f/u voiding trial.  Scheduling made aware to schedule patient for f/u voiding trial.

## 2021-05-04 DIAGNOSIS — M6281 Muscle weakness (generalized): Secondary | ICD-10-CM | POA: Diagnosis not present

## 2021-05-04 DIAGNOSIS — E1159 Type 2 diabetes mellitus with other circulatory complications: Secondary | ICD-10-CM | POA: Diagnosis not present

## 2021-05-04 DIAGNOSIS — R339 Retention of urine, unspecified: Secondary | ICD-10-CM | POA: Diagnosis not present

## 2021-05-04 DIAGNOSIS — I213 ST elevation (STEMI) myocardial infarction of unspecified site: Secondary | ICD-10-CM | POA: Diagnosis not present

## 2021-05-04 DIAGNOSIS — E1122 Type 2 diabetes mellitus with diabetic chronic kidney disease: Secondary | ICD-10-CM | POA: Diagnosis not present

## 2021-05-04 DIAGNOSIS — I5022 Chronic systolic (congestive) heart failure: Secondary | ICD-10-CM | POA: Diagnosis not present

## 2021-05-04 DIAGNOSIS — I129 Hypertensive chronic kidney disease with stage 1 through stage 4 chronic kidney disease, or unspecified chronic kidney disease: Secondary | ICD-10-CM | POA: Diagnosis not present

## 2021-05-04 DIAGNOSIS — R2689 Other abnormalities of gait and mobility: Secondary | ICD-10-CM | POA: Diagnosis not present

## 2021-05-04 DIAGNOSIS — N183 Chronic kidney disease, stage 3 unspecified: Secondary | ICD-10-CM | POA: Diagnosis not present

## 2021-05-04 DIAGNOSIS — J9601 Acute respiratory failure with hypoxia: Secondary | ICD-10-CM | POA: Diagnosis not present

## 2021-05-05 DIAGNOSIS — E1159 Type 2 diabetes mellitus with other circulatory complications: Secondary | ICD-10-CM | POA: Diagnosis not present

## 2021-05-05 DIAGNOSIS — I213 ST elevation (STEMI) myocardial infarction of unspecified site: Secondary | ICD-10-CM | POA: Diagnosis not present

## 2021-05-05 DIAGNOSIS — E1122 Type 2 diabetes mellitus with diabetic chronic kidney disease: Secondary | ICD-10-CM | POA: Diagnosis not present

## 2021-05-05 DIAGNOSIS — I5022 Chronic systolic (congestive) heart failure: Secondary | ICD-10-CM | POA: Diagnosis not present

## 2021-05-05 DIAGNOSIS — N183 Chronic kidney disease, stage 3 unspecified: Secondary | ICD-10-CM | POA: Diagnosis not present

## 2021-05-05 DIAGNOSIS — J9601 Acute respiratory failure with hypoxia: Secondary | ICD-10-CM | POA: Diagnosis not present

## 2021-05-05 DIAGNOSIS — R339 Retention of urine, unspecified: Secondary | ICD-10-CM | POA: Diagnosis not present

## 2021-05-05 DIAGNOSIS — R2689 Other abnormalities of gait and mobility: Secondary | ICD-10-CM | POA: Diagnosis not present

## 2021-05-05 DIAGNOSIS — I129 Hypertensive chronic kidney disease with stage 1 through stage 4 chronic kidney disease, or unspecified chronic kidney disease: Secondary | ICD-10-CM | POA: Diagnosis not present

## 2021-05-05 DIAGNOSIS — M6281 Muscle weakness (generalized): Secondary | ICD-10-CM | POA: Diagnosis not present

## 2021-05-06 ENCOUNTER — Other Ambulatory Visit: Payer: Self-pay | Admitting: *Deleted

## 2021-05-06 DIAGNOSIS — M6281 Muscle weakness (generalized): Secondary | ICD-10-CM | POA: Diagnosis not present

## 2021-05-06 DIAGNOSIS — N183 Chronic kidney disease, stage 3 unspecified: Secondary | ICD-10-CM | POA: Diagnosis not present

## 2021-05-06 DIAGNOSIS — I129 Hypertensive chronic kidney disease with stage 1 through stage 4 chronic kidney disease, or unspecified chronic kidney disease: Secondary | ICD-10-CM | POA: Diagnosis not present

## 2021-05-06 DIAGNOSIS — I5022 Chronic systolic (congestive) heart failure: Secondary | ICD-10-CM | POA: Diagnosis not present

## 2021-05-06 DIAGNOSIS — R2689 Other abnormalities of gait and mobility: Secondary | ICD-10-CM | POA: Diagnosis not present

## 2021-05-06 DIAGNOSIS — R339 Retention of urine, unspecified: Secondary | ICD-10-CM | POA: Diagnosis not present

## 2021-05-06 DIAGNOSIS — E1159 Type 2 diabetes mellitus with other circulatory complications: Secondary | ICD-10-CM | POA: Diagnosis not present

## 2021-05-06 DIAGNOSIS — E1122 Type 2 diabetes mellitus with diabetic chronic kidney disease: Secondary | ICD-10-CM | POA: Diagnosis not present

## 2021-05-06 DIAGNOSIS — I213 ST elevation (STEMI) myocardial infarction of unspecified site: Secondary | ICD-10-CM | POA: Diagnosis not present

## 2021-05-06 DIAGNOSIS — J9601 Acute respiratory failure with hypoxia: Secondary | ICD-10-CM | POA: Diagnosis not present

## 2021-05-06 NOTE — Patient Outreach (Signed)
[facility]) Care Management  05/06/2021  [name] 27-Jan-1941 [id number]  Referral Received 12/16 Initial Outreach 12/21 Transition of care completed by provider's office  Spoke with pt today and introduced [facility] program and services. Credential verified and further discussion on pt's recent discharge from the hospital. Pt states he is doing well with no more symptoms of dizziness. State he is trying to increase his mobility with safety precautions while ambulating.  Pt has support system and understanding all his discharge instructions with no additional needs. RN offered to follow up next week or next month concerning his recent discharge however pt declined indicating he is "fine. RN offered to send [facility] information packet and informed pt to contact [facility] office or this RN case manager with any related issues concerning his health however verified pt is aware with immediate needs and request he needs to call his provider's office with such request. Pt verbalized an understanding and very grateful for the call toady.  No further needs will send out packet and notified the provider of pt's disposition with [facility] services. Case will be closed.  Raina Mina, RN Care Management Coordinator [place] Office [phone]

## 2021-05-12 DIAGNOSIS — M6281 Muscle weakness (generalized): Secondary | ICD-10-CM | POA: Diagnosis not present

## 2021-05-12 DIAGNOSIS — I5022 Chronic systolic (congestive) heart failure: Secondary | ICD-10-CM | POA: Diagnosis not present

## 2021-05-12 DIAGNOSIS — N183 Chronic kidney disease, stage 3 unspecified: Secondary | ICD-10-CM | POA: Diagnosis not present

## 2021-05-12 DIAGNOSIS — R339 Retention of urine, unspecified: Secondary | ICD-10-CM | POA: Diagnosis not present

## 2021-05-12 DIAGNOSIS — R2689 Other abnormalities of gait and mobility: Secondary | ICD-10-CM | POA: Diagnosis not present

## 2021-05-12 DIAGNOSIS — J9601 Acute respiratory failure with hypoxia: Secondary | ICD-10-CM | POA: Diagnosis not present

## 2021-05-12 DIAGNOSIS — I129 Hypertensive chronic kidney disease with stage 1 through stage 4 chronic kidney disease, or unspecified chronic kidney disease: Secondary | ICD-10-CM | POA: Diagnosis not present

## 2021-05-12 DIAGNOSIS — E1159 Type 2 diabetes mellitus with other circulatory complications: Secondary | ICD-10-CM | POA: Diagnosis not present

## 2021-05-12 DIAGNOSIS — I213 ST elevation (STEMI) myocardial infarction of unspecified site: Secondary | ICD-10-CM | POA: Diagnosis not present

## 2021-05-12 DIAGNOSIS — E1122 Type 2 diabetes mellitus with diabetic chronic kidney disease: Secondary | ICD-10-CM | POA: Diagnosis not present

## 2021-05-13 DIAGNOSIS — M6281 Muscle weakness (generalized): Secondary | ICD-10-CM | POA: Diagnosis not present

## 2021-05-13 DIAGNOSIS — I5022 Chronic systolic (congestive) heart failure: Secondary | ICD-10-CM | POA: Diagnosis not present

## 2021-05-13 DIAGNOSIS — I213 ST elevation (STEMI) myocardial infarction of unspecified site: Secondary | ICD-10-CM | POA: Diagnosis not present

## 2021-05-13 DIAGNOSIS — N183 Chronic kidney disease, stage 3 unspecified: Secondary | ICD-10-CM | POA: Diagnosis not present

## 2021-05-13 DIAGNOSIS — E1159 Type 2 diabetes mellitus with other circulatory complications: Secondary | ICD-10-CM | POA: Diagnosis not present

## 2021-05-13 DIAGNOSIS — R339 Retention of urine, unspecified: Secondary | ICD-10-CM | POA: Diagnosis not present

## 2021-05-13 DIAGNOSIS — R2689 Other abnormalities of gait and mobility: Secondary | ICD-10-CM | POA: Diagnosis not present

## 2021-05-13 DIAGNOSIS — E1122 Type 2 diabetes mellitus with diabetic chronic kidney disease: Secondary | ICD-10-CM | POA: Diagnosis not present

## 2021-05-13 DIAGNOSIS — J9601 Acute respiratory failure with hypoxia: Secondary | ICD-10-CM | POA: Diagnosis not present

## 2021-05-13 DIAGNOSIS — I129 Hypertensive chronic kidney disease with stage 1 through stage 4 chronic kidney disease, or unspecified chronic kidney disease: Secondary | ICD-10-CM | POA: Diagnosis not present

## 2021-05-14 DIAGNOSIS — A403 Sepsis due to Streptococcus pneumoniae: Secondary | ICD-10-CM | POA: Diagnosis not present

## 2021-05-14 DIAGNOSIS — I5040 Unspecified combined systolic (congestive) and diastolic (congestive) heart failure: Secondary | ICD-10-CM | POA: Diagnosis not present

## 2021-05-14 DIAGNOSIS — N390 Urinary tract infection, site not specified: Secondary | ICD-10-CM | POA: Diagnosis not present

## 2021-05-14 DIAGNOSIS — I213 ST elevation (STEMI) myocardial infarction of unspecified site: Secondary | ICD-10-CM | POA: Diagnosis not present

## 2021-05-14 DIAGNOSIS — R339 Retention of urine, unspecified: Secondary | ICD-10-CM | POA: Diagnosis not present

## 2021-05-14 DIAGNOSIS — E1122 Type 2 diabetes mellitus with diabetic chronic kidney disease: Secondary | ICD-10-CM | POA: Diagnosis not present

## 2021-05-14 DIAGNOSIS — R7309 Other abnormal glucose: Secondary | ICD-10-CM | POA: Diagnosis not present

## 2021-05-14 DIAGNOSIS — I480 Paroxysmal atrial fibrillation: Secondary | ICD-10-CM | POA: Diagnosis not present

## 2021-05-15 DIAGNOSIS — R2689 Other abnormalities of gait and mobility: Secondary | ICD-10-CM | POA: Diagnosis not present

## 2021-05-15 DIAGNOSIS — M6281 Muscle weakness (generalized): Secondary | ICD-10-CM | POA: Diagnosis not present

## 2021-05-15 DIAGNOSIS — I213 ST elevation (STEMI) myocardial infarction of unspecified site: Secondary | ICD-10-CM | POA: Diagnosis not present

## 2021-05-15 DIAGNOSIS — N183 Chronic kidney disease, stage 3 unspecified: Secondary | ICD-10-CM | POA: Diagnosis not present

## 2021-05-15 DIAGNOSIS — I5022 Chronic systolic (congestive) heart failure: Secondary | ICD-10-CM | POA: Diagnosis not present

## 2021-05-15 DIAGNOSIS — E1159 Type 2 diabetes mellitus with other circulatory complications: Secondary | ICD-10-CM | POA: Diagnosis not present

## 2021-05-15 DIAGNOSIS — J9601 Acute respiratory failure with hypoxia: Secondary | ICD-10-CM | POA: Diagnosis not present

## 2021-05-15 DIAGNOSIS — E1122 Type 2 diabetes mellitus with diabetic chronic kidney disease: Secondary | ICD-10-CM | POA: Diagnosis not present

## 2021-05-15 DIAGNOSIS — R339 Retention of urine, unspecified: Secondary | ICD-10-CM | POA: Diagnosis not present

## 2021-05-15 DIAGNOSIS — I129 Hypertensive chronic kidney disease with stage 1 through stage 4 chronic kidney disease, or unspecified chronic kidney disease: Secondary | ICD-10-CM | POA: Diagnosis not present

## 2021-05-17 DIAGNOSIS — I5022 Chronic systolic (congestive) heart failure: Secondary | ICD-10-CM | POA: Diagnosis not present

## 2021-05-17 DIAGNOSIS — I213 ST elevation (STEMI) myocardial infarction of unspecified site: Secondary | ICD-10-CM | POA: Diagnosis not present

## 2021-05-17 DIAGNOSIS — C3490 Malignant neoplasm of unspecified part of unspecified bronchus or lung: Secondary | ICD-10-CM | POA: Diagnosis not present

## 2021-05-17 DIAGNOSIS — J9601 Acute respiratory failure with hypoxia: Secondary | ICD-10-CM | POA: Diagnosis not present

## 2021-05-19 DIAGNOSIS — C3490 Malignant neoplasm of unspecified part of unspecified bronchus or lung: Secondary | ICD-10-CM | POA: Diagnosis not present

## 2021-05-19 DIAGNOSIS — Z515 Encounter for palliative care: Secondary | ICD-10-CM | POA: Diagnosis not present

## 2021-05-20 ENCOUNTER — Telehealth (HOSPITAL_COMMUNITY): Payer: Self-pay | Admitting: Hematology

## 2021-05-20 DIAGNOSIS — I213 ST elevation (STEMI) myocardial infarction of unspecified site: Secondary | ICD-10-CM | POA: Diagnosis not present

## 2021-05-20 DIAGNOSIS — N183 Chronic kidney disease, stage 3 unspecified: Secondary | ICD-10-CM | POA: Diagnosis not present

## 2021-05-20 DIAGNOSIS — J9601 Acute respiratory failure with hypoxia: Secondary | ICD-10-CM | POA: Diagnosis not present

## 2021-05-20 DIAGNOSIS — R339 Retention of urine, unspecified: Secondary | ICD-10-CM | POA: Diagnosis not present

## 2021-05-20 DIAGNOSIS — E1122 Type 2 diabetes mellitus with diabetic chronic kidney disease: Secondary | ICD-10-CM | POA: Diagnosis not present

## 2021-05-20 DIAGNOSIS — R2689 Other abnormalities of gait and mobility: Secondary | ICD-10-CM | POA: Diagnosis not present

## 2021-05-20 DIAGNOSIS — E1159 Type 2 diabetes mellitus with other circulatory complications: Secondary | ICD-10-CM | POA: Diagnosis not present

## 2021-05-20 DIAGNOSIS — I129 Hypertensive chronic kidney disease with stage 1 through stage 4 chronic kidney disease, or unspecified chronic kidney disease: Secondary | ICD-10-CM | POA: Diagnosis not present

## 2021-05-20 DIAGNOSIS — M6281 Muscle weakness (generalized): Secondary | ICD-10-CM | POA: Diagnosis not present

## 2021-05-20 DIAGNOSIS — I5022 Chronic systolic (congestive) heart failure: Secondary | ICD-10-CM | POA: Diagnosis not present

## 2021-05-21 ENCOUNTER — Other Ambulatory Visit (HOSPITAL_COMMUNITY): Payer: Self-pay

## 2021-05-21 ENCOUNTER — Telehealth (HOSPITAL_COMMUNITY): Payer: Self-pay | Admitting: Pharmacy Technician

## 2021-05-21 DIAGNOSIS — I213 ST elevation (STEMI) myocardial infarction of unspecified site: Secondary | ICD-10-CM | POA: Diagnosis not present

## 2021-05-21 DIAGNOSIS — E1159 Type 2 diabetes mellitus with other circulatory complications: Secondary | ICD-10-CM | POA: Diagnosis not present

## 2021-05-21 DIAGNOSIS — R339 Retention of urine, unspecified: Secondary | ICD-10-CM | POA: Diagnosis not present

## 2021-05-21 DIAGNOSIS — I129 Hypertensive chronic kidney disease with stage 1 through stage 4 chronic kidney disease, or unspecified chronic kidney disease: Secondary | ICD-10-CM | POA: Diagnosis not present

## 2021-05-21 DIAGNOSIS — C3490 Malignant neoplasm of unspecified part of unspecified bronchus or lung: Secondary | ICD-10-CM

## 2021-05-21 DIAGNOSIS — N183 Chronic kidney disease, stage 3 unspecified: Secondary | ICD-10-CM | POA: Diagnosis not present

## 2021-05-21 DIAGNOSIS — E1122 Type 2 diabetes mellitus with diabetic chronic kidney disease: Secondary | ICD-10-CM | POA: Diagnosis not present

## 2021-05-21 DIAGNOSIS — I5022 Chronic systolic (congestive) heart failure: Secondary | ICD-10-CM | POA: Diagnosis not present

## 2021-05-21 DIAGNOSIS — R2689 Other abnormalities of gait and mobility: Secondary | ICD-10-CM | POA: Diagnosis not present

## 2021-05-21 DIAGNOSIS — J9601 Acute respiratory failure with hypoxia: Secondary | ICD-10-CM | POA: Diagnosis not present

## 2021-05-21 DIAGNOSIS — M6281 Muscle weakness (generalized): Secondary | ICD-10-CM | POA: Diagnosis not present

## 2021-05-21 MED ORDER — DABRAFENIB MESYLATE 50 MG PO CAPS: ORAL_CAPSULE | ORAL | 6 refills | Status: AC

## 2021-05-21 MED ORDER — TRAMETINIB DIMETHYL SULFOXIDE 0.5 MG PO TABS: ORAL_TABLET | ORAL | 6 refills | Status: DC

## 2021-05-21 MED ORDER — TRAMETINIB DIMETHYL SULFOXIDE 0.5 MG PO TABS: ORAL_TABLET | ORAL | 6 refills | Status: AC

## 2021-05-21 MED ORDER — DABRAFENIB MESYLATE 50 MG PO CAPS: ORAL_CAPSULE | ORAL | 6 refills | Status: DC

## 2021-05-21 NOTE — Telephone Encounter (Signed)
Mekinist and Tafinlar refilled per last office visit with Dr. Delton Coombes.

## 2021-05-25 DIAGNOSIS — E1159 Type 2 diabetes mellitus with other circulatory complications: Secondary | ICD-10-CM | POA: Diagnosis not present

## 2021-05-25 DIAGNOSIS — I213 ST elevation (STEMI) myocardial infarction of unspecified site: Secondary | ICD-10-CM | POA: Diagnosis not present

## 2021-05-25 DIAGNOSIS — E1122 Type 2 diabetes mellitus with diabetic chronic kidney disease: Secondary | ICD-10-CM | POA: Diagnosis not present

## 2021-05-25 DIAGNOSIS — M6281 Muscle weakness (generalized): Secondary | ICD-10-CM | POA: Diagnosis not present

## 2021-05-25 DIAGNOSIS — N183 Chronic kidney disease, stage 3 unspecified: Secondary | ICD-10-CM | POA: Diagnosis not present

## 2021-05-25 DIAGNOSIS — R339 Retention of urine, unspecified: Secondary | ICD-10-CM | POA: Diagnosis not present

## 2021-05-25 DIAGNOSIS — R2689 Other abnormalities of gait and mobility: Secondary | ICD-10-CM | POA: Diagnosis not present

## 2021-05-25 DIAGNOSIS — I129 Hypertensive chronic kidney disease with stage 1 through stage 4 chronic kidney disease, or unspecified chronic kidney disease: Secondary | ICD-10-CM | POA: Diagnosis not present

## 2021-05-25 DIAGNOSIS — I5022 Chronic systolic (congestive) heart failure: Secondary | ICD-10-CM | POA: Diagnosis not present

## 2021-05-25 DIAGNOSIS — J9601 Acute respiratory failure with hypoxia: Secondary | ICD-10-CM | POA: Diagnosis not present

## 2021-05-27 DIAGNOSIS — M6281 Muscle weakness (generalized): Secondary | ICD-10-CM | POA: Diagnosis not present

## 2021-05-27 DIAGNOSIS — E1159 Type 2 diabetes mellitus with other circulatory complications: Secondary | ICD-10-CM | POA: Diagnosis not present

## 2021-05-27 DIAGNOSIS — I213 ST elevation (STEMI) myocardial infarction of unspecified site: Secondary | ICD-10-CM | POA: Diagnosis not present

## 2021-05-27 DIAGNOSIS — I5022 Chronic systolic (congestive) heart failure: Secondary | ICD-10-CM | POA: Diagnosis not present

## 2021-05-27 DIAGNOSIS — R2689 Other abnormalities of gait and mobility: Secondary | ICD-10-CM | POA: Diagnosis not present

## 2021-05-27 DIAGNOSIS — N183 Chronic kidney disease, stage 3 unspecified: Secondary | ICD-10-CM | POA: Diagnosis not present

## 2021-05-27 DIAGNOSIS — I129 Hypertensive chronic kidney disease with stage 1 through stage 4 chronic kidney disease, or unspecified chronic kidney disease: Secondary | ICD-10-CM | POA: Diagnosis not present

## 2021-05-27 DIAGNOSIS — R339 Retention of urine, unspecified: Secondary | ICD-10-CM | POA: Diagnosis not present

## 2021-05-27 DIAGNOSIS — J9601 Acute respiratory failure with hypoxia: Secondary | ICD-10-CM | POA: Diagnosis not present

## 2021-05-27 DIAGNOSIS — E1122 Type 2 diabetes mellitus with diabetic chronic kidney disease: Secondary | ICD-10-CM | POA: Diagnosis not present

## 2021-05-28 DIAGNOSIS — J9601 Acute respiratory failure with hypoxia: Secondary | ICD-10-CM | POA: Diagnosis not present

## 2021-05-28 DIAGNOSIS — N183 Chronic kidney disease, stage 3 unspecified: Secondary | ICD-10-CM | POA: Diagnosis not present

## 2021-05-28 DIAGNOSIS — R2689 Other abnormalities of gait and mobility: Secondary | ICD-10-CM | POA: Diagnosis not present

## 2021-05-28 DIAGNOSIS — R339 Retention of urine, unspecified: Secondary | ICD-10-CM | POA: Diagnosis not present

## 2021-05-28 DIAGNOSIS — I5022 Chronic systolic (congestive) heart failure: Secondary | ICD-10-CM | POA: Diagnosis not present

## 2021-05-28 DIAGNOSIS — I129 Hypertensive chronic kidney disease with stage 1 through stage 4 chronic kidney disease, or unspecified chronic kidney disease: Secondary | ICD-10-CM | POA: Diagnosis not present

## 2021-05-28 DIAGNOSIS — E1122 Type 2 diabetes mellitus with diabetic chronic kidney disease: Secondary | ICD-10-CM | POA: Diagnosis not present

## 2021-05-28 DIAGNOSIS — I213 ST elevation (STEMI) myocardial infarction of unspecified site: Secondary | ICD-10-CM | POA: Diagnosis not present

## 2021-05-28 DIAGNOSIS — M6281 Muscle weakness (generalized): Secondary | ICD-10-CM | POA: Diagnosis not present

## 2021-05-28 DIAGNOSIS — E1159 Type 2 diabetes mellitus with other circulatory complications: Secondary | ICD-10-CM | POA: Diagnosis not present

## 2021-05-29 DIAGNOSIS — I129 Hypertensive chronic kidney disease with stage 1 through stage 4 chronic kidney disease, or unspecified chronic kidney disease: Secondary | ICD-10-CM | POA: Diagnosis not present

## 2021-05-29 DIAGNOSIS — M6281 Muscle weakness (generalized): Secondary | ICD-10-CM | POA: Diagnosis not present

## 2021-05-29 DIAGNOSIS — J9601 Acute respiratory failure with hypoxia: Secondary | ICD-10-CM | POA: Diagnosis not present

## 2021-05-29 DIAGNOSIS — R2689 Other abnormalities of gait and mobility: Secondary | ICD-10-CM | POA: Diagnosis not present

## 2021-05-29 DIAGNOSIS — N183 Chronic kidney disease, stage 3 unspecified: Secondary | ICD-10-CM | POA: Diagnosis not present

## 2021-05-29 DIAGNOSIS — E1122 Type 2 diabetes mellitus with diabetic chronic kidney disease: Secondary | ICD-10-CM | POA: Diagnosis not present

## 2021-05-29 DIAGNOSIS — R339 Retention of urine, unspecified: Secondary | ICD-10-CM | POA: Diagnosis not present

## 2021-05-29 DIAGNOSIS — I5022 Chronic systolic (congestive) heart failure: Secondary | ICD-10-CM | POA: Diagnosis not present

## 2021-05-29 DIAGNOSIS — E1159 Type 2 diabetes mellitus with other circulatory complications: Secondary | ICD-10-CM | POA: Diagnosis not present

## 2021-05-29 DIAGNOSIS — I213 ST elevation (STEMI) myocardial infarction of unspecified site: Secondary | ICD-10-CM | POA: Diagnosis not present

## 2021-06-02 DIAGNOSIS — I5022 Chronic systolic (congestive) heart failure: Secondary | ICD-10-CM | POA: Diagnosis not present

## 2021-06-02 DIAGNOSIS — E1159 Type 2 diabetes mellitus with other circulatory complications: Secondary | ICD-10-CM | POA: Diagnosis not present

## 2021-06-02 DIAGNOSIS — I213 ST elevation (STEMI) myocardial infarction of unspecified site: Secondary | ICD-10-CM | POA: Diagnosis not present

## 2021-06-02 DIAGNOSIS — R339 Retention of urine, unspecified: Secondary | ICD-10-CM | POA: Diagnosis not present

## 2021-06-02 DIAGNOSIS — I129 Hypertensive chronic kidney disease with stage 1 through stage 4 chronic kidney disease, or unspecified chronic kidney disease: Secondary | ICD-10-CM | POA: Diagnosis not present

## 2021-06-02 DIAGNOSIS — R2689 Other abnormalities of gait and mobility: Secondary | ICD-10-CM | POA: Diagnosis not present

## 2021-06-02 DIAGNOSIS — J9601 Acute respiratory failure with hypoxia: Secondary | ICD-10-CM | POA: Diagnosis not present

## 2021-06-02 DIAGNOSIS — N183 Chronic kidney disease, stage 3 unspecified: Secondary | ICD-10-CM | POA: Diagnosis not present

## 2021-06-02 DIAGNOSIS — M6281 Muscle weakness (generalized): Secondary | ICD-10-CM | POA: Diagnosis not present

## 2021-06-02 DIAGNOSIS — E1122 Type 2 diabetes mellitus with diabetic chronic kidney disease: Secondary | ICD-10-CM | POA: Diagnosis not present

## 2021-06-03 DIAGNOSIS — I213 ST elevation (STEMI) myocardial infarction of unspecified site: Secondary | ICD-10-CM | POA: Diagnosis not present

## 2021-06-03 DIAGNOSIS — I5022 Chronic systolic (congestive) heart failure: Secondary | ICD-10-CM | POA: Diagnosis not present

## 2021-06-03 DIAGNOSIS — J9601 Acute respiratory failure with hypoxia: Secondary | ICD-10-CM | POA: Diagnosis not present

## 2021-06-03 DIAGNOSIS — E1122 Type 2 diabetes mellitus with diabetic chronic kidney disease: Secondary | ICD-10-CM | POA: Diagnosis not present

## 2021-06-03 DIAGNOSIS — E1159 Type 2 diabetes mellitus with other circulatory complications: Secondary | ICD-10-CM | POA: Diagnosis not present

## 2021-06-03 DIAGNOSIS — M6281 Muscle weakness (generalized): Secondary | ICD-10-CM | POA: Diagnosis not present

## 2021-06-03 DIAGNOSIS — I129 Hypertensive chronic kidney disease with stage 1 through stage 4 chronic kidney disease, or unspecified chronic kidney disease: Secondary | ICD-10-CM | POA: Diagnosis not present

## 2021-06-03 DIAGNOSIS — R2689 Other abnormalities of gait and mobility: Secondary | ICD-10-CM | POA: Diagnosis not present

## 2021-06-03 DIAGNOSIS — N183 Chronic kidney disease, stage 3 unspecified: Secondary | ICD-10-CM | POA: Diagnosis not present

## 2021-06-03 DIAGNOSIS — R339 Retention of urine, unspecified: Secondary | ICD-10-CM | POA: Diagnosis not present

## 2021-06-04 DIAGNOSIS — R2689 Other abnormalities of gait and mobility: Secondary | ICD-10-CM | POA: Diagnosis not present

## 2021-06-04 DIAGNOSIS — I5022 Chronic systolic (congestive) heart failure: Secondary | ICD-10-CM | POA: Diagnosis not present

## 2021-06-04 DIAGNOSIS — E1122 Type 2 diabetes mellitus with diabetic chronic kidney disease: Secondary | ICD-10-CM | POA: Diagnosis not present

## 2021-06-04 DIAGNOSIS — I129 Hypertensive chronic kidney disease with stage 1 through stage 4 chronic kidney disease, or unspecified chronic kidney disease: Secondary | ICD-10-CM | POA: Diagnosis not present

## 2021-06-04 DIAGNOSIS — M6281 Muscle weakness (generalized): Secondary | ICD-10-CM | POA: Diagnosis not present

## 2021-06-04 DIAGNOSIS — E1159 Type 2 diabetes mellitus with other circulatory complications: Secondary | ICD-10-CM | POA: Diagnosis not present

## 2021-06-04 DIAGNOSIS — N183 Chronic kidney disease, stage 3 unspecified: Secondary | ICD-10-CM | POA: Diagnosis not present

## 2021-06-04 DIAGNOSIS — I213 ST elevation (STEMI) myocardial infarction of unspecified site: Secondary | ICD-10-CM | POA: Diagnosis not present

## 2021-06-04 DIAGNOSIS — J9601 Acute respiratory failure with hypoxia: Secondary | ICD-10-CM | POA: Diagnosis not present

## 2021-06-04 DIAGNOSIS — R339 Retention of urine, unspecified: Secondary | ICD-10-CM | POA: Diagnosis not present

## 2021-06-05 DIAGNOSIS — I213 ST elevation (STEMI) myocardial infarction of unspecified site: Secondary | ICD-10-CM | POA: Diagnosis not present

## 2021-06-05 DIAGNOSIS — E1159 Type 2 diabetes mellitus with other circulatory complications: Secondary | ICD-10-CM | POA: Diagnosis not present

## 2021-06-05 DIAGNOSIS — I129 Hypertensive chronic kidney disease with stage 1 through stage 4 chronic kidney disease, or unspecified chronic kidney disease: Secondary | ICD-10-CM | POA: Diagnosis not present

## 2021-06-05 DIAGNOSIS — I5022 Chronic systolic (congestive) heart failure: Secondary | ICD-10-CM | POA: Diagnosis not present

## 2021-06-05 DIAGNOSIS — M6281 Muscle weakness (generalized): Secondary | ICD-10-CM | POA: Diagnosis not present

## 2021-06-05 DIAGNOSIS — J9601 Acute respiratory failure with hypoxia: Secondary | ICD-10-CM | POA: Diagnosis not present

## 2021-06-05 DIAGNOSIS — N183 Chronic kidney disease, stage 3 unspecified: Secondary | ICD-10-CM | POA: Diagnosis not present

## 2021-06-05 DIAGNOSIS — E1122 Type 2 diabetes mellitus with diabetic chronic kidney disease: Secondary | ICD-10-CM | POA: Diagnosis not present

## 2021-06-05 DIAGNOSIS — R2689 Other abnormalities of gait and mobility: Secondary | ICD-10-CM | POA: Diagnosis not present

## 2021-06-05 DIAGNOSIS — R339 Retention of urine, unspecified: Secondary | ICD-10-CM | POA: Diagnosis not present

## 2021-06-08 DIAGNOSIS — E1122 Type 2 diabetes mellitus with diabetic chronic kidney disease: Secondary | ICD-10-CM | POA: Diagnosis not present

## 2021-06-08 DIAGNOSIS — J9601 Acute respiratory failure with hypoxia: Secondary | ICD-10-CM | POA: Diagnosis not present

## 2021-06-08 DIAGNOSIS — N183 Chronic kidney disease, stage 3 unspecified: Secondary | ICD-10-CM | POA: Diagnosis not present

## 2021-06-08 DIAGNOSIS — C3490 Malignant neoplasm of unspecified part of unspecified bronchus or lung: Secondary | ICD-10-CM | POA: Diagnosis not present

## 2021-06-08 DIAGNOSIS — R42 Dizziness and giddiness: Secondary | ICD-10-CM | POA: Diagnosis not present

## 2021-06-08 DIAGNOSIS — M6281 Muscle weakness (generalized): Secondary | ICD-10-CM | POA: Diagnosis not present

## 2021-06-08 DIAGNOSIS — R339 Retention of urine, unspecified: Secondary | ICD-10-CM | POA: Diagnosis not present

## 2021-06-08 DIAGNOSIS — I129 Hypertensive chronic kidney disease with stage 1 through stage 4 chronic kidney disease, or unspecified chronic kidney disease: Secondary | ICD-10-CM | POA: Diagnosis not present

## 2021-06-08 DIAGNOSIS — I5022 Chronic systolic (congestive) heart failure: Secondary | ICD-10-CM | POA: Diagnosis not present

## 2021-06-08 DIAGNOSIS — R2689 Other abnormalities of gait and mobility: Secondary | ICD-10-CM | POA: Diagnosis not present

## 2021-06-08 DIAGNOSIS — I213 ST elevation (STEMI) myocardial infarction of unspecified site: Secondary | ICD-10-CM | POA: Diagnosis not present

## 2021-06-08 DIAGNOSIS — M810 Age-related osteoporosis without current pathological fracture: Secondary | ICD-10-CM | POA: Diagnosis not present

## 2021-06-08 DIAGNOSIS — E1159 Type 2 diabetes mellitus with other circulatory complications: Secondary | ICD-10-CM | POA: Diagnosis not present

## 2021-06-09 DIAGNOSIS — E1122 Type 2 diabetes mellitus with diabetic chronic kidney disease: Secondary | ICD-10-CM | POA: Diagnosis not present

## 2021-06-09 DIAGNOSIS — E1159 Type 2 diabetes mellitus with other circulatory complications: Secondary | ICD-10-CM | POA: Diagnosis not present

## 2021-06-09 DIAGNOSIS — R2689 Other abnormalities of gait and mobility: Secondary | ICD-10-CM | POA: Diagnosis not present

## 2021-06-09 DIAGNOSIS — M6281 Muscle weakness (generalized): Secondary | ICD-10-CM | POA: Diagnosis not present

## 2021-06-09 DIAGNOSIS — J9601 Acute respiratory failure with hypoxia: Secondary | ICD-10-CM | POA: Diagnosis not present

## 2021-06-09 DIAGNOSIS — I213 ST elevation (STEMI) myocardial infarction of unspecified site: Secondary | ICD-10-CM | POA: Diagnosis not present

## 2021-06-09 DIAGNOSIS — I5022 Chronic systolic (congestive) heart failure: Secondary | ICD-10-CM | POA: Diagnosis not present

## 2021-06-09 DIAGNOSIS — N183 Chronic kidney disease, stage 3 unspecified: Secondary | ICD-10-CM | POA: Diagnosis not present

## 2021-06-09 DIAGNOSIS — I129 Hypertensive chronic kidney disease with stage 1 through stage 4 chronic kidney disease, or unspecified chronic kidney disease: Secondary | ICD-10-CM | POA: Diagnosis not present

## 2021-06-09 DIAGNOSIS — R339 Retention of urine, unspecified: Secondary | ICD-10-CM | POA: Diagnosis not present

## 2021-06-11 DIAGNOSIS — R339 Retention of urine, unspecified: Secondary | ICD-10-CM | POA: Diagnosis not present

## 2021-06-11 DIAGNOSIS — N183 Chronic kidney disease, stage 3 unspecified: Secondary | ICD-10-CM | POA: Diagnosis not present

## 2021-06-11 DIAGNOSIS — I213 ST elevation (STEMI) myocardial infarction of unspecified site: Secondary | ICD-10-CM | POA: Diagnosis not present

## 2021-06-11 DIAGNOSIS — J9601 Acute respiratory failure with hypoxia: Secondary | ICD-10-CM | POA: Diagnosis not present

## 2021-06-11 DIAGNOSIS — I5022 Chronic systolic (congestive) heart failure: Secondary | ICD-10-CM | POA: Diagnosis not present

## 2021-06-11 DIAGNOSIS — M6281 Muscle weakness (generalized): Secondary | ICD-10-CM | POA: Diagnosis not present

## 2021-06-11 DIAGNOSIS — I129 Hypertensive chronic kidney disease with stage 1 through stage 4 chronic kidney disease, or unspecified chronic kidney disease: Secondary | ICD-10-CM | POA: Diagnosis not present

## 2021-06-11 DIAGNOSIS — R2689 Other abnormalities of gait and mobility: Secondary | ICD-10-CM | POA: Diagnosis not present

## 2021-06-11 DIAGNOSIS — E1159 Type 2 diabetes mellitus with other circulatory complications: Secondary | ICD-10-CM | POA: Diagnosis not present

## 2021-06-11 DIAGNOSIS — E1122 Type 2 diabetes mellitus with diabetic chronic kidney disease: Secondary | ICD-10-CM | POA: Diagnosis not present

## 2021-06-16 DIAGNOSIS — C7800 Secondary malignant neoplasm of unspecified lung: Secondary | ICD-10-CM | POA: Diagnosis not present

## 2021-06-16 DIAGNOSIS — C712 Malignant neoplasm of temporal lobe: Secondary | ICD-10-CM | POA: Diagnosis not present

## 2021-06-17 DIAGNOSIS — C3490 Malignant neoplasm of unspecified part of unspecified bronchus or lung: Secondary | ICD-10-CM | POA: Diagnosis not present

## 2021-06-17 DIAGNOSIS — I129 Hypertensive chronic kidney disease with stage 1 through stage 4 chronic kidney disease, or unspecified chronic kidney disease: Secondary | ICD-10-CM | POA: Diagnosis not present

## 2021-06-17 DIAGNOSIS — E1122 Type 2 diabetes mellitus with diabetic chronic kidney disease: Secondary | ICD-10-CM | POA: Diagnosis not present

## 2021-06-17 DIAGNOSIS — R339 Retention of urine, unspecified: Secondary | ICD-10-CM | POA: Diagnosis not present

## 2021-06-17 DIAGNOSIS — M6281 Muscle weakness (generalized): Secondary | ICD-10-CM | POA: Diagnosis not present

## 2021-06-17 DIAGNOSIS — N183 Chronic kidney disease, stage 3 unspecified: Secondary | ICD-10-CM | POA: Diagnosis not present

## 2021-06-17 DIAGNOSIS — I213 ST elevation (STEMI) myocardial infarction of unspecified site: Secondary | ICD-10-CM | POA: Diagnosis not present

## 2021-06-17 DIAGNOSIS — I5022 Chronic systolic (congestive) heart failure: Secondary | ICD-10-CM | POA: Diagnosis not present

## 2021-06-17 DIAGNOSIS — E1159 Type 2 diabetes mellitus with other circulatory complications: Secondary | ICD-10-CM | POA: Diagnosis not present

## 2021-06-17 DIAGNOSIS — R2689 Other abnormalities of gait and mobility: Secondary | ICD-10-CM | POA: Diagnosis not present

## 2021-06-17 DIAGNOSIS — J9601 Acute respiratory failure with hypoxia: Secondary | ICD-10-CM | POA: Diagnosis not present

## 2021-06-18 DIAGNOSIS — M6281 Muscle weakness (generalized): Secondary | ICD-10-CM | POA: Diagnosis not present

## 2021-06-18 DIAGNOSIS — I129 Hypertensive chronic kidney disease with stage 1 through stage 4 chronic kidney disease, or unspecified chronic kidney disease: Secondary | ICD-10-CM | POA: Diagnosis not present

## 2021-06-18 DIAGNOSIS — R339 Retention of urine, unspecified: Secondary | ICD-10-CM | POA: Diagnosis not present

## 2021-06-18 DIAGNOSIS — E1122 Type 2 diabetes mellitus with diabetic chronic kidney disease: Secondary | ICD-10-CM | POA: Diagnosis not present

## 2021-06-18 DIAGNOSIS — R2689 Other abnormalities of gait and mobility: Secondary | ICD-10-CM | POA: Diagnosis not present

## 2021-06-18 DIAGNOSIS — I5022 Chronic systolic (congestive) heart failure: Secondary | ICD-10-CM | POA: Diagnosis not present

## 2021-06-18 DIAGNOSIS — N183 Chronic kidney disease, stage 3 unspecified: Secondary | ICD-10-CM | POA: Diagnosis not present

## 2021-06-18 DIAGNOSIS — I213 ST elevation (STEMI) myocardial infarction of unspecified site: Secondary | ICD-10-CM | POA: Diagnosis not present

## 2021-06-18 DIAGNOSIS — E1159 Type 2 diabetes mellitus with other circulatory complications: Secondary | ICD-10-CM | POA: Diagnosis not present

## 2021-06-18 DIAGNOSIS — J9601 Acute respiratory failure with hypoxia: Secondary | ICD-10-CM | POA: Diagnosis not present

## 2021-06-22 NOTE — Telephone Encounter (Signed)
Oral Oncology Patient Advocate Encounter  Received notification from [facility] that patient has been successfully enrolled into their program to receive [place] from the manufacturer at $0 out of pocket until 05/16/22.    I called and spoke with patient.  He knows we will have to re-apply.   Specialty Pharmacy that will dispense medication is [facility].  Patient knows to call the office with questions or concerns.   Oral Oncology Clinic will continue to follow.  [place] [facility] [place] Phone [phone] Fax [phone] 06/22/2021 11:57 AM

## 2021-06-24 DIAGNOSIS — M810 Age-related osteoporosis without current pathological fracture: Secondary | ICD-10-CM | POA: Diagnosis not present

## 2021-06-24 DIAGNOSIS — M6281 Muscle weakness (generalized): Secondary | ICD-10-CM | POA: Diagnosis not present

## 2021-07-08 DIAGNOSIS — E1122 Type 2 diabetes mellitus with diabetic chronic kidney disease: Secondary | ICD-10-CM | POA: Diagnosis not present

## 2021-07-08 DIAGNOSIS — C7800 Secondary malignant neoplasm of unspecified lung: Secondary | ICD-10-CM | POA: Diagnosis not present

## 2021-07-08 DIAGNOSIS — E440 Moderate protein-calorie malnutrition: Secondary | ICD-10-CM | POA: Diagnosis not present

## 2021-07-13 ENCOUNTER — Other Ambulatory Visit (HOSPITAL_COMMUNITY): Payer: Self-pay

## 2021-07-22 ENCOUNTER — Ambulatory Visit: Payer: Medicare Other | Admitting: Cardiology

## 2021-07-28 DIAGNOSIS — C7800 Secondary malignant neoplasm of unspecified lung: Secondary | ICD-10-CM | POA: Diagnosis not present

## 2021-07-28 DIAGNOSIS — E1122 Type 2 diabetes mellitus with diabetic chronic kidney disease: Secondary | ICD-10-CM | POA: Diagnosis not present

## 2021-08-18 DIAGNOSIS — I25119 Atherosclerotic heart disease of native coronary artery with unspecified angina pectoris: Secondary | ICD-10-CM | POA: Diagnosis not present

## 2021-08-18 DIAGNOSIS — C3490 Malignant neoplasm of unspecified part of unspecified bronchus or lung: Secondary | ICD-10-CM | POA: Diagnosis not present

## 2022-01-14 ENCOUNTER — Other Ambulatory Visit (HOSPITAL_COMMUNITY): Payer: Self-pay

## 2022-05-27 ENCOUNTER — Encounter: Payer: Self-pay | Admitting: Urology

## 2022-05-27 ENCOUNTER — Ambulatory Visit (INDEPENDENT_AMBULATORY_CARE_PROVIDER_SITE_OTHER): Admitting: Urology

## 2022-05-27 ENCOUNTER — Other Ambulatory Visit: Payer: Self-pay

## 2022-05-27 VITALS — BP 146/71 | HR 93

## 2022-05-27 DIAGNOSIS — R339 Retention of urine, unspecified: Secondary | ICD-10-CM | POA: Diagnosis not present

## 2022-05-27 MED ORDER — TAMSULOSIN HCL 0.4 MG PO CAPS: 0.4000 mg | ORAL_CAPSULE | Freq: Every day | ORAL | Status: DC

## 2022-05-27 MED ORDER — TAMSULOSIN HCL 0.4 MG PO CAPS: 0.4000 mg | ORAL_CAPSULE | Freq: Every day | ORAL | 0 refills | Status: DC

## 2022-05-27 NOTE — Progress Notes (Signed)
Subjective: 1. Urinary retention     [name] was seen by Dr. Pete Glatter in 11/22 for retention.  He has been managed with a foley that is changed monthly since.   He has a history of metastatic lung cancer and has seen Dr. Ellin Saba but is no longer on therapy.   He has not had any imaging in the past year.   He is currently at home under Hospice care.  He can walk with a walker.  He has no GI issues.  He had been on tamsulosin.  He has a prior TURP at [facility] in 2012 by Dr. Vernie Ammons.  He had a CT in 12/22 and the prostate was fairly small on that study.   ROS:  Review of Systems  Respiratory:  Positive for cough.   Neurological:  Positive for weakness.    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   LIMA to LAD, SVG to D1, SVG to OM2, SVG to RCA, open vein harvest right thigh and lower leg   Spontaneous pneumothorax 09/15/2010   right    Past Surgical History:  Procedure Laterality Date   ANKLE FRACTURE SURGERY  2008   [facility]   APPENDECTOMY  [phone]   CHEST TUBE INSERTION Right 09/15/2010   Dr Tyrone Sage - spontaneous PTX   CHOLECYSTECTOMY  2008   COLONOSCOPY N/A 09/07/2017   Procedure: COLONOSCOPY;  Surgeon: Corbin Ade, MD;  Location: [facility] ENDO SUITE;  Service: Endoscopy;  Laterality: N/A;  10:30am   CORONARY ARTERY BYPASS GRAFT  2003   EYE SURGERY  2001   Cataracts removed bilaterally   INCISIONAL HERNIA REPAIR N/A 01/29/2015   Procedure: Sherald Hess HERNIORRHAPHY WITH MESH;  Surgeon: Franky Macho, MD;  Location: [facility] ORS;  Service: General;  Laterality: N/A;   INSERTION OF MESH N/A 01/29/2015   Procedure: INSERTION OF MESH;  Surgeon: Franky Macho, MD;  Location: [facility] ORS;  Service: General;  Laterality: N/A;   KNEE SURGERY  1993   Laproscopic-[place]   LEFT HEART CATH AND  CORS/GRAFTS ANGIOGRAPHY N/A 06/20/2017   Procedure: LEFT HEART CATH AND CORS/GRAFTS ANGIOGRAPHY;  Surgeon: Swaziland, Peter M, MD;  Location: [facility] INVASIVE CV LAB;  Service: Cardiovascular;  Laterality: N/A;   ORIF ANKLE FRACTURE  08/26/2011   Procedure: OPEN REDUCTION INTERNAL FIXATION (ORIF) ANKLE FRACTURE;  Surgeon: Darreld Mclean, MD;  Location: [facility] ORS;  Service: Orthopedics;  Laterality: Right;   POLYPECTOMY  09/07/2017   Procedure: POLYPECTOMY;  Surgeon: Corbin Ade, MD;  Location: [facility] ENDO SUITE;  Service: Endoscopy;;  ascending x2 (cold snare)   PROSTATE SURGERY  2012   Enlarged prostate   TEE WITHOUT CARDIOVERSION N/A 03/27/2021   Procedure: TRANSESOPHAGEAL ECHOCARDIOGRAM (TEE);  Surgeon: Antoine Poche, MD;  Location: [facility] ORS;  Service: Endoscopy;  Laterality: N/A;   TRANSURETHRAL RESECTION OF PROSTATE  2012    Social History   Socioeconomic History   Marital status: Married    Spouse name: Not on file   Number of children: Not on file   Years of education: Not on file   Highest education level: Not on file  Occupational History   Not on file  Tobacco Use   Smoking status: Former    Packs/day: 1.00  Years: 33.00    Total pack years: 33.00    Types: Cigarettes    Quit date: 05/17/1987    Years since quitting: 35.0   Smokeless tobacco: Never  Vaping Use   Vaping Use: Never used  Substance and Sexual Activity   Alcohol use: No   Drug use: No   Sexual activity: Not Currently  Other Topics Concern   Not on file  Social History Narrative   Not on file   Social Determinants of Health   Financial Resource Strain: Low Risk  (06/12/2020)   Overall Financial Resource Strain (CARDIA)    Difficulty of Paying Living Expenses: Not very hard  Food Insecurity: No Food Insecurity (06/12/2020)   Hunger Vital Sign    Worried About Running Out of Food in the Last Year: Never true    Ran Out of Food in the Last Year: Never true  Transportation Needs: No Transportation Needs  (06/12/2020)   PRAPARE - Administrator, Civil Service (Medical): No    Lack of Transportation (Non-Medical): No  Physical Activity: Inactive (06/12/2020)   Exercise Vital Sign    Days of Exercise per Week: 0 days    Minutes of Exercise per Session: 0 min  Stress: No Stress Concern Present (06/12/2020)   [employer] of Occupational Health - Occupational Stress Questionnaire    Feeling of Stress : Not at all  Social Connections: Socially Integrated (06/12/2020)   Social Connection and Isolation Panel [NHANES]    Frequency of Communication with Friends and Family: More than three times a week    Frequency of Social Gatherings with Friends and Family: Three times a week    Attends Religious Services: 1 to 4 times per year    Active Member of Clubs or Organizations: No    Attends Banker Meetings: 1 to 4 times per year    Marital Status: Married  Catering manager Violence: Not At Risk (06/12/2020)   Humiliation, Afraid, Rape, and Kick questionnaire    Fear of Current or Ex-Partner: No    Emotionally Abused: No    Physically Abused: No    Sexually Abused: No    Family History  Problem Relation Age of Onset   Aneurysm Mother    Heart attack Father    Colon cancer Maternal Uncle    Cancer Brother    Gastric cancer Neg Hx    Esophageal cancer Neg Hx     Anti-infectives: Anti-infectives (From admission, onward)    None       Current Outpatient Medications  Medication Sig Dispense Refill   alendronate (FOSAMAX) 70 MG tablet Take 70 mg by mouth See admin instructions. Take 70 mg weekly every Sunday     amLODipine (NORVASC) 10 MG tablet Take 10 mg by mouth daily.     aspirin EC 81 MG tablet Take 81 mg by mouth daily. Swallow whole.     atorvastatin (LIPITOR) 80 MG tablet Take 1 tablet (80 mg total) by mouth daily.     benzonatate (TESSALON) 200 MG capsule Take 200 mg by mouth 3 (three) times daily as needed for cough.     Cholecalciferol (VITAMIN D3)  5000 units CAPS Take 5,000 Units by mouth daily.     dabrafenib mesylate (TAFINLAR) 50 MG capsule TAKE 2 CAPSULES (100 MG TOTAL) BY MOUTH 2 (TWO) TIMES DAILY. TAKE ON AN EMPTY STOMACH 1 HOUR BEFORE OR 2 HOURS AFTER MEALS. 120 capsule 6   DOCUSATE SODIUM PO Take 50 mg  by mouth daily.     escitalopram (LEXAPRO) 10 MG tablet Take 10 mg by mouth daily.      feeding supplement (ENSURE ENLIVE / ENSURE PLUS) LIQD Take 237 mLs by mouth 2 (two) times daily between meals. 237 mL 12   glimepiride (AMARYL) 1 MG tablet Take 1 tablet (1 mg total) by mouth daily with breakfast.     insulin aspart (NOVOLOG) 100 UNIT/ML injection Inject 2-8 Units into the skin daily as needed for high blood sugar. SLIDING SCALE     isosorbide mononitrate (IMDUR) 30 MG 24 hr tablet Take 30 mg by mouth daily.     meclizine (ANTIVERT) 25 MG tablet Take 25 mg by mouth 3 (three) times daily as needed for dizziness.     metFORMIN (GLUCOPHAGE-XR) 500 MG 24 hr tablet Take 1 tablet (500 mg total) by mouth 2 (two) times daily with a meal.     metoprolol succinate (TOPROL-XL) 25 MG 24 hr tablet Take 1 tablet (25 mg total) by mouth daily. (Patient taking differently: Take 100 mg by mouth daily.)     nitroGLYCERIN (NITROSTAT) 0.4 MG SL tablet Place 0.4 mg under the tongue every 5 (five) minutes as needed for chest pain.      ondansetron (ZOFRAN) 4 MG tablet Take 1 tablet (4 mg total) by mouth every 6 (six) hours as needed for nausea. 20 tablet 0   ONE TOUCH ULTRA TEST test strip      pioglitazone (ACTOS) 45 MG tablet Take 45 mg by mouth daily.     polyethylene glycol (MIRALAX / GLYCOLAX) 17 g packet Take 17 g by mouth daily as needed for mild constipation. 14 each 0   ranolazine (RANEXA) 500 MG 12 hr tablet Take 1 tablet (500 mg total) by mouth 2 (two) times daily. 180 tablet 3   trametinib dimethyl sulfoxide (MEKINIST) 0.5 MG tablet TAKE 2 TABLETS (1 MG TOTAL) BY MOUTH DAILY. TAKE 1 HOUR BEFORE OR 2 HOURS AFTER A MEAL. STORE REFRIGERATED IN  ORIGINAL CONTAINER. 60 tablet 6   tamsulosin (FLOMAX) 0.4 MG CAPS capsule Take 1 capsule (0.4 mg total) by mouth daily after supper. 30 capsule 0   No current facility-administered medications for this visit.     Objective: Vital signs in last 24 hours: BP (!) 146/71   Pulse 93   Intake/Output from previous day: No intake/output data recorded. Intake/Output this shift: @IOTHISSHIFT @   Physical Exam Vitals reviewed.  Constitutional:      Appearance: Normal appearance.  Neurological:     Mental Status: He is alert.     Motor: Weakness present.     Lab Results:  No results found for this or any previous visit (from the past 24 hour(s)).  BMET No results for input(s): "NA", "K", "CL", "CO2", "GLUCOSE", "BUN", "CREATININE", "CALCIUM" in the last 72 hours. PT/INR No results for input(s): "LABPROT", "INR" in the last 72 hours. ABG No results for input(s): "PHART", "HCO3" in the last 72 hours.  Invalid input(s): "PCO2", "PO2"  Studies/Results: No results found. CT films from 12/22 reviewed.   Assessment/Plan: Chronic urinary retention.   He would like to try to be catheter free.   I will get a culture today and resume the tamsulosin and I reviewed the side effects.  I will have him return in a week for a voiding trial.    Meds ordered this encounter  Medications   DISCONTD: tamsulosin (FLOMAX) 0.4 MG CAPS capsule    Sig: Take 1 capsule (0.4 mg  total) by mouth daily after supper.    Dispense:  30 capsule   tamsulosin (FLOMAX) 0.4 MG CAPS capsule    Sig: Take 1 capsule (0.4 mg total) by mouth daily after supper.    Dispense:  30 capsule    Refill:  0     Orders Placed This Encounter  Procedures   Urine Culture     Return in about 1 week (around 06/03/2022) for He needs a morning nurse visit next Thursday for a voiding trial.  .    CC: Dr. Carylon Perches.      Bjorn Pippin 05/28/2022

## 2022-05-27 NOTE — Progress Notes (Signed)
Pt here today for bladder scan. Bladder was scanned and 0 was visualized.    Performed by Kennyth Lose, CMA

## 2022-05-29 LAB — URINE CULTURE

## 2022-05-31 ENCOUNTER — Telehealth: Payer: Self-pay

## 2022-05-31 DIAGNOSIS — R339 Retention of urine, unspecified: Secondary | ICD-10-CM

## 2022-05-31 DIAGNOSIS — N390 Urinary tract infection, site not specified: Secondary | ICD-10-CM

## 2022-05-31 MED ORDER — CEPHALEXIN 500 MG PO CAPS: 500.0000 mg | ORAL_CAPSULE | Freq: Two times a day (BID) | ORAL | 0 refills | Status: AC

## 2022-05-31 NOTE — Telephone Encounter (Signed)
-----  Message from Irine Seal, MD sent at 05/31/2022  2:19 PM EST ----- His culture has mx species.   He needs keflex 500mg  po bid #6 to start 2 days prior to the voiding trial.  ----- Message ----- From: Sherrilyn Rist, CMA Sent: 05/31/2022   9:51 AM EST To: Irine Seal, MD  Please review

## 2022-05-31 NOTE — Telephone Encounter (Signed)
Made patient aware that his urine culture showed mix species and a rx antibx was sent to his pharmacy. Made patient aware to start taking the antibx 2 days prior to his voiding trial. Patient voiced understanding.

## 2022-06-03 ENCOUNTER — Ambulatory Visit (INDEPENDENT_AMBULATORY_CARE_PROVIDER_SITE_OTHER): Payer: Medicare Other | Admitting: Urology

## 2022-06-03 DIAGNOSIS — R339 Retention of urine, unspecified: Secondary | ICD-10-CM

## 2022-06-03 NOTE — Progress Notes (Signed)
Fill and Pull Catheter Removal  Patient is present today for a catheter removal.  Patient was cleaned and prepped in a sterile fashion of sterile water/ saline was instilled into the bladder when the patient felt the urge to urinate. 62ml of water was then drained from the balloon.  A 16FR foley cath was removed from the bladder no complications were noted .  Patient as then given some time to void on their own.  Patient can void  38ml on their own after some time.  Patient tolerated well.  Performed by: Guss Bunde, CMA  Simple Catheter Placement  Due to urinary retention patient is present today for a foley cath placement.  Patient was cleaned and prepped in a sterile fashion with betadine and 2% lidocaine jelly was instilled into the urethra. A 16 FR foley catheter was inserted, urine return was noted  , urine was clear in color.  The balloon was filled with 10cc of sterile water.  A bed bag was attached for drainage.  Patient was given instruction on proper catheter care.  Patient tolerated well, no complications were noted   Performed by: Guss Bunde, CMA  Additional notes/ Follow up: Verbal from MD to f/u with [place] voiding trial in 1 month.

## 2022-06-24 ENCOUNTER — Ambulatory Visit (INDEPENDENT_AMBULATORY_CARE_PROVIDER_SITE_OTHER): Payer: Medicare Other | Admitting: Urology

## 2022-06-24 DIAGNOSIS — R339 Retention of urine, unspecified: Secondary | ICD-10-CM

## 2022-06-24 NOTE — Progress Notes (Signed)
Fill and Pull Catheter Removal  Patient is present today for a catheter removal.  Patient was cleaned and prepped in a sterile fashion 350 ml of sterile water/ saline was instilled into the bladder when the patient felt the urge to urinate. 10 ml of water was then drained from the balloon.  A 16 FR foley cath was removed from the bladder no complications were noted .  Patient as then given some time to void on their own.  Patient cannot void  38ml on their own after some time.  Patient tolerated well.  Performed by: Marisue Brooklyn, CMA  Follow up/ Additional notes: Come back at 2 pm for PVR

## 2022-06-24 NOTE — Progress Notes (Signed)
post void residual =121mL Patient able to void 56ml in urinal in office.  Patient will f/u for PVR in 1 week.

## 2022-06-28 ENCOUNTER — Telehealth: Payer: Self-pay

## 2022-06-28 NOTE — Telephone Encounter (Signed)
Patient had a voiding trial 02/08 and passed but now is unable to voided. Vivia Budge from hospice/home health voiced that patient has been drinking fluid but is unable to voided. Verbal from Dr. Alyson Ingles to reinsert catheter. Santiago Glad voiced understanding.

## 2022-07-02 ENCOUNTER — Ambulatory Visit: Payer: Medicare Other

## 2022-07-02 ENCOUNTER — Telehealth: Payer: Self-pay

## 2022-07-02 NOTE — Telephone Encounter (Signed)
Patient came in to office today for a PVR. Patient had cathter reinsert on 06/28/22 because patient could not void, verbal from Dr. Alyson Ingles. Patient has upcoming appt on 08/05/22 for PVR. Made patient aware that I will reach out to Dr.Wrenn about his upcoming appt and if that should be change to a voiding trial. Patient is aware that Dr. Jeffie Pollock is on vacation and he will response once he return to office. Patient is aware that someone will follow back up with him once MD is back in office. Patient voiced understanding

## 2022-07-02 NOTE — Telephone Encounter (Signed)
Patient is aware If he would like to have a voiding trial, that is fine.   High probability that he will fail and need to continue with the catheter but no harm trying. Patient states that he rather not have a voiding trial and he will continued with catheter. Patient voiced understanding

## 2022-08-05 ENCOUNTER — Ambulatory Visit (INDEPENDENT_AMBULATORY_CARE_PROVIDER_SITE_OTHER): Admitting: Urology

## 2022-08-05 ENCOUNTER — Encounter: Payer: Self-pay | Admitting: Urology

## 2022-08-05 VITALS — BP 132/62 | HR 75 | Ht 71.0 in | Wt 152.0 lb

## 2022-08-05 DIAGNOSIS — R339 Retention of urine, unspecified: Secondary | ICD-10-CM | POA: Diagnosis not present

## 2022-08-05 DIAGNOSIS — N401 Enlarged prostate with lower urinary tract symptoms: Secondary | ICD-10-CM | POA: Diagnosis not present

## 2022-08-05 DIAGNOSIS — N138 Other obstructive and reflux uropathy: Secondary | ICD-10-CM

## 2022-08-05 NOTE — Progress Notes (Signed)
Subjective: 1. Urinary retention   2. BPH with urinary obstruction     08/05/22: [name] returns today in f/u.  He field the voiding trial on 06/24/22.  He has decided that he doesn't want to pursue further evaluation and testing as he is in hospice care and will just need catheter changes q4-6wks.   06/03/22: [name] was seen by Dr. Felipa Eth in 11/22 for retention.  He has been managed with a foley that is changed monthly since.   He has a history of metastatic lung cancer and has seen Dr. Delton Coombes but is no longer on therapy.   He has not had any imaging in the past year.   He is currently at home under Hospice care.  He can walk with a walker.  He has no GI issues.  He had been on tamsulosin.  He has a prior TURP at [facility] in 2012 by Dr. Karsten Ro.  He had a CT in 12/22 and the prostate was fairly small on that study.   ROS:  Review of Systems  Respiratory:  Positive for cough.   Neurological:  Positive for weakness.    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   LIMA to LAD, SVG to D1, SVG to OM2, SVG to RCA, open vein harvest right thigh and lower leg   Spontaneous pneumothorax 09/15/2010   right    Past Surgical History:  Procedure Laterality Date   ANKLE FRACTURE SURGERY  2008   [facility]   APPENDECTOMY  [id number]   CHEST TUBE INSERTION Right 09/15/2010   Dr Servando Snare - spontaneous PTX   CHOLECYSTECTOMY  2008   COLONOSCOPY N/A 09/07/2017   Procedure: COLONOSCOPY;  Surgeon: Daneil Dolin, MD;  Location: [facility];  Service: Endoscopy;  Laterality: N/A;  10:30am   CORONARY ARTERY BYPASS GRAFT  2003   EYE SURGERY  2001   Cataracts removed bilaterally   INCISIONAL HERNIA REPAIR N/A 01/29/2015   Procedure: Fatima Blank HERNIORRHAPHY WITH MESH;  Surgeon: Aviva Signs, MD;   Location: [facility] ORS;  Service: General;  Laterality: N/A;   INSERTION OF MESH N/A 01/29/2015   Procedure: INSERTION OF MESH;  Surgeon: Aviva Signs, MD;  Location: [facility] ORS;  Service: General;  Laterality: N/A;   [place] CATH AND CORS/GRAFTS ANGIOGRAPHY N/A 06/20/2017   Procedure: LEFT HEART CATH AND CORS/GRAFTS ANGIOGRAPHY;  Surgeon: Martinique, Peter M, MD;  Location: [facility];  Service: Cardiovascular;  Laterality: N/A;   ORIF ANKLE FRACTURE  08/26/2011   Procedure: OPEN REDUCTION INTERNAL FIXATION (ORIF) ANKLE FRACTURE;  Surgeon: Sanjuana Kava, MD;  Location: [facility];  Service: Orthopedics;  Laterality: Right;   POLYPECTOMY  09/07/2017   Procedure: POLYPECTOMY;  Surgeon: Daneil Dolin, MD;  Location: [facility];  Service: Endoscopy;;  ascending x2 (cold snare)   PROSTATE SURGERY  2012   Enlarged prostate   TEE WITHOUT CARDIOVERSION N/A 03/27/2021   Procedure: TRANSESOPHAGEAL ECHOCARDIOGRAM (TEE);  Surgeon: Arnoldo Lenis, MD;  Location: [facility];  Service: Endoscopy;  Laterality: N/A;   TRANSURETHRAL RESECTION OF PROSTATE  2012    Social History   Socioeconomic History   Marital status: Married  Spouse name: Not on file   Number of children: Not on file   Years of education: Not on file   Highest education level: Not on file  Occupational History   Not on file  Tobacco Use   Smoking status: Former    Packs/day: 1.00    Years: 33.00    Additional pack years: 0.00    Total pack years: 33.00    Types: Cigarettes    Quit date: 05/17/1987    Years since quitting: 35.2   Smokeless tobacco: Never  Vaping Use   Vaping Use: Never used  Substance and Sexual Activity   Alcohol use: No   Drug use: No   Sexual activity: Not Currently  Other Topics Concern   Not on file  Social History Narrative   Not on file   Social Determinants of Health   Financial Resource Strain: Low Risk  (06/12/2020)   Overall Financial Resource  Strain (CARDIA)    Difficulty of Paying Living Expenses: Not very hard  Food Insecurity: No Food Insecurity (06/12/2020)   Hunger Vital Sign    Worried About Running Out of Food in the Last Year: Never true    [place] in the Last Year: Never true  Transportation Needs: No Transportation Needs (06/12/2020)   PRAPARE - Hydrologist (Medical): No    Lack of Transportation (Non-Medical): No  Physical Activity: Inactive (06/12/2020)   Exercise Vital Sign    Days of Exercise per Week: 0 days    Minutes of Exercise per Session: 0 min  Stress: No Stress Concern Present (06/12/2020)   [place]    Feeling of Stress : Not at all  Social Connections: [place] (06/12/2020)   Social Connection and Isolation Panel [NHANES]    Frequency of Communication with Friends and Family: More than three times a week    Frequency of Social Gatherings with Friends and Family: Three times a week    Attends Religious Services: 1 to 4 times per year    Active Member of Clubs or Organizations: No    Attends Archivist Meetings: 1 to 4 times per year    Marital Status: Married  Human resources officer Violence: Not At Risk (06/12/2020)   Humiliation, Afraid, Rape, and Kick questionnaire    Fear of Current or Ex-Partner: No    Emotionally Abused: No    Physically Abused: No    Sexually Abused: No    Family History  Problem Relation Age of Onset   Aneurysm Mother    Heart attack Father    Colon cancer Maternal Uncle    Cancer Brother    Gastric cancer Neg Hx    Esophageal cancer Neg Hx     Anti-infectives: Anti-infectives (From admission, onward)    None       Current Outpatient Medications  Medication Sig Dispense Refill   alendronate (FOSAMAX) 70 MG tablet Take 70 mg by mouth See admin instructions. Take 70 mg weekly every Sunday     amLODipine (NORVASC) 10 MG tablet Take 10 mg by  mouth daily.     aspirin EC 81 MG tablet Take 81 mg by mouth daily. Swallow whole.     atorvastatin (LIPITOR) 80 MG tablet Take 1 tablet (80 mg total) by mouth daily.     benzonatate (TESSALON) 200 MG capsule Take 200 mg by mouth 3 (three) times daily as needed for cough.  cephALEXin (KEFLEX) 500 MG capsule Take 1 capsule (500 mg total) by mouth 2 (two) times daily. 6 capsule 0   Cholecalciferol (VITAMIN D3) 5000 units CAPS Take 5,000 Units by mouth daily.     dabrafenib mesylate (TAFINLAR) 50 MG capsule TAKE 2 CAPSULES (100 MG TOTAL) BY MOUTH 2 (TWO) TIMES DAILY. TAKE ON AN EMPTY STOMACH 1 HOUR BEFORE OR 2 HOURS AFTER MEALS. 120 capsule 6   DOCUSATE SODIUM PO Take 50 mg by mouth daily.     escitalopram (LEXAPRO) 10 MG tablet Take 10 mg by mouth daily.      feeding supplement (ENSURE ENLIVE / ENSURE PLUS) LIQD Take 237 mLs by mouth 2 (two) times daily between meals. 237 mL 12   glimepiride (AMARYL) 1 MG tablet Take 1 tablet (1 mg total) by mouth daily with breakfast.     insulin aspart (NOVOLOG) 100 UNIT/ML injection Inject 2-8 Units into the skin daily as needed for high blood sugar. SLIDING SCALE     isosorbide mononitrate (IMDUR) 30 MG 24 hr tablet Take 30 mg by mouth daily.     meclizine (ANTIVERT) 25 MG tablet Take 25 mg by mouth 3 (three) times daily as needed for dizziness.     metFORMIN (GLUCOPHAGE-XR) 500 MG 24 hr tablet Take 1 tablet (500 mg total) by mouth 2 (two) times daily with a meal.     metoprolol succinate (TOPROL-XL) 25 MG 24 hr tablet Take 1 tablet (25 mg total) by mouth daily. (Patient taking differently: Take 100 mg by mouth daily.)     nitroGLYCERIN (NITROSTAT) 0.4 MG SL tablet Place 0.4 mg under the tongue every 5 (five) minutes as needed for chest pain.      ondansetron (ZOFRAN) 4 MG tablet Take 1 tablet (4 mg total) by mouth every 6 (six) hours as needed for nausea. 20 tablet 0   ONE TOUCH ULTRA TEST test strip      pioglitazone (ACTOS) 45 MG tablet Take 45 mg by mouth  daily.     polyethylene glycol (MIRALAX / GLYCOLAX) 17 g packet Take 17 g by mouth daily as needed for mild constipation. 14 each 0   ranolazine (RANEXA) 500 MG 12 hr tablet Take 1 tablet (500 mg total) by mouth 2 (two) times daily. 180 tablet 3   trametinib dimethyl sulfoxide (MEKINIST) 0.5 MG tablet TAKE 2 TABLETS (1 MG TOTAL) BY MOUTH DAILY. TAKE 1 HOUR BEFORE OR 2 HOURS AFTER A MEAL. STORE REFRIGERATED IN ORIGINAL CONTAINER. 60 tablet 6   No current facility-administered medications for this visit.     Objective: Vital signs in last 24 hours: BP 132/62   Pulse 75   Ht 5\' 11"  (1.803 m)   Wt 152 lb (68.9 kg)   BMI 21.20 kg/m   Intake/Output from previous day: No intake/output data recorded. Intake/Output this shift: @IOTHISSHIFT @   Physical Exam Vitals reviewed.  Constitutional:      Appearance: Normal appearance.  Neurological:     Mental Status: He is alert.     Motor: Weakness present.     Lab Results:  No results found for this or any previous visit (from the past 24 hour(s)).  BMET No results for input(s): "NA", "K", "CL", "CO2", "GLUCOSE", "BUN", "CREATININE", "CALCIUM" in the last 72 hours. PT/INR No results for input(s): "LABPROT", "INR" in the last 72 hours. ABG No results for input(s): "PHART", "HCO3" in the last 72 hours.  Invalid input(s): "PCO2", "PO2"  Studies/Results: No results found.   Assessment/Plan: Chronic  urinary retention.   He failed his voiding trial and is content to continue with the catheter.  He will have it changed every 4-6 weeks and will see me as needed.   No orders of the defined types were placed in this encounter.    Orders Placed This Encounter  Procedures   INSERT,TEMP INDWELLING BLAD CATH,SIMPLE     Return in about 4 weeks (around 09/02/2022) for He will need ongoing catheter exchanges q4-6 weeks and see me prn. .    CC: Dr. [name].      Irine Seal 08/06/2022

## 2022-08-05 NOTE — Progress Notes (Signed)
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  38ml of water was removed from the balloon, a 16FR foley cath was removed without difficulty.  Patient was cleaned and prepped in a sterile fashion with betadine and 2% lidocaine jelly was instilled into the urethra. A 16 FR foley cath was replaced into the bladder, no complications were noted. Urine return was noted 77ml and urine was yellow in color. The balloon was filled with 3ml of sterile water. A bed bag was attached for drainage. Patient was given proper instruction on catheter care.    Performed by: Levi Aland, CMA  Follow up: Follow up as scheduled.

## 2022-09-02 ENCOUNTER — Ambulatory Visit: Payer: Medicare Other

## 2022-09-03 ENCOUNTER — Ambulatory Visit: Payer: Medicare Other

## 2022-09-30 ENCOUNTER — Ambulatory Visit: Payer: Medicare Other

## 2022-10-22 DIAGNOSIS — E1129 Type 2 diabetes mellitus with other diabetic kidney complication: Secondary | ICD-10-CM | POA: Diagnosis not present

## 2022-10-22 DIAGNOSIS — C3490 Malignant neoplasm of unspecified part of unspecified bronchus or lung: Secondary | ICD-10-CM | POA: Diagnosis not present

## 2022-10-22 DIAGNOSIS — C719 Malignant neoplasm of brain, unspecified: Secondary | ICD-10-CM | POA: Diagnosis not present

## 2022-10-22 DIAGNOSIS — I2510 Atherosclerotic heart disease of native coronary artery without angina pectoris: Secondary | ICD-10-CM | POA: Diagnosis not present

## 2022-11-01 ENCOUNTER — Ambulatory Visit: Payer: Medicare Other

## 2023-05-19 ENCOUNTER — Telehealth: Payer: Self-pay | Admitting: Urology

## 2023-05-19 NOTE — Telephone Encounter (Signed)
 Patient is being discharged from hospice he will need to resume having his cath changed at the office.  Last cath change with Hospice was 05/09/23.  Appt scheduled with nurse for 06/07/23 . Just notifying you.

## 2023-05-31 DIAGNOSIS — C3490 Malignant neoplasm of unspecified part of unspecified bronchus or lung: Secondary | ICD-10-CM | POA: Diagnosis not present

## 2023-05-31 DIAGNOSIS — Z515 Encounter for palliative care: Secondary | ICD-10-CM | POA: Diagnosis not present

## 2023-06-07 ENCOUNTER — Ambulatory Visit: Payer: Medicare Other

## 2023-06-14 ENCOUNTER — Ambulatory Visit (INDEPENDENT_AMBULATORY_CARE_PROVIDER_SITE_OTHER): Payer: Medicare Other

## 2023-06-14 DIAGNOSIS — R339 Retention of urine, unspecified: Secondary | ICD-10-CM

## 2023-06-14 NOTE — Progress Notes (Signed)
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  10ml of water was removed from the balloon, a 16FR foley cath was removed without difficulty.  Patient was cleaned and prepped in a sterile fashion with betadine. A 16 FR foley cath was replaced into the bladder, no complications were noted. No urine return was noted. Catheter flushed with of sterile water and return was noted. The balloon was filled with 10ml of sterile water. A bedside bag was attached for drainage.  Patient was given proper instruction on catheter care.    Performed by: Tacoya Altizer LPN  Follow up: No follow-ups on file.

## 2023-06-14 NOTE — Patient Instructions (Signed)

## 2023-07-04 DIAGNOSIS — C3490 Malignant neoplasm of unspecified part of unspecified bronchus or lung: Secondary | ICD-10-CM | POA: Diagnosis not present

## 2023-07-04 DIAGNOSIS — Z515 Encounter for palliative care: Secondary | ICD-10-CM | POA: Diagnosis not present

## 2023-07-15 ENCOUNTER — Ambulatory Visit: Payer: Medicare Other

## 2023-07-15 DIAGNOSIS — R339 Retention of urine, unspecified: Secondary | ICD-10-CM | POA: Diagnosis not present

## 2023-07-15 NOTE — Progress Notes (Signed)
 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  10ml of water was removed from the balloon, a 16FR foley cath was removed without difficulty.  Patient was cleaned and prepped in a sterile fashion with Betadinex3.  A 16 FR foley cath was replaced into the bladder, no complications were noted. Urine return was noted 50ml and urine was Clear yellow in color. The balloon was filled with 10ml of sterile water. A bed bag was attached for drainage.  A night bag was also given to the patient and patient was given instruction on how to change from one bag to another. Patient was given proper instruction on catheter care.    Performed by: Guss Bunde, CMA  Follow up: 4 weeks

## 2023-07-21 DIAGNOSIS — C3490 Malignant neoplasm of unspecified part of unspecified bronchus or lung: Secondary | ICD-10-CM | POA: Diagnosis not present

## 2023-07-21 DIAGNOSIS — I25118 Atherosclerotic heart disease of native coronary artery with other forms of angina pectoris: Secondary | ICD-10-CM | POA: Diagnosis not present

## 2023-08-09 DIAGNOSIS — C3490 Malignant neoplasm of unspecified part of unspecified bronchus or lung: Secondary | ICD-10-CM | POA: Diagnosis not present

## 2023-08-09 DIAGNOSIS — Z515 Encounter for palliative care: Secondary | ICD-10-CM | POA: Diagnosis not present

## 2023-08-10 ENCOUNTER — Ambulatory Visit: Payer: Medicare Other

## 2023-08-18 ENCOUNTER — Ambulatory Visit

## 2023-08-24 ENCOUNTER — Ambulatory Visit (INDEPENDENT_AMBULATORY_CARE_PROVIDER_SITE_OTHER)

## 2023-08-24 DIAGNOSIS — R339 Retention of urine, unspecified: Secondary | ICD-10-CM

## 2023-08-24 MED ORDER — CIPROFLOXACIN HCL 500 MG PO TABS
500.0000 mg | ORAL_TABLET | Freq: Once | ORAL | Status: AC
  Administered 2023-08-24: 500 mg via ORAL

## 2023-08-24 NOTE — Progress Notes (Signed)
 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  10ml of water was removed from the balloon, a 16FR foley cath was removed without difficulty.  Patient was cleaned and prepped in a sterile fashion with Betadinex3.  A 16 FR foley cath was replaced into the bladder, no complications were noted. Urine return was noted 0ml and catheter was flushed w/ 10ml sterile water. The balloon was filled with 10ml of sterile water. A bed bag was attached for drainage.  A night bag was also given to the patient and patient was given instruction on how to change from one bag to another. Patient was given proper instruction on catheter care.    Performed by: Alfonse Spruce. CMA  Follow up: 4 weeks

## 2023-09-13 ENCOUNTER — Telehealth: Payer: Self-pay

## 2023-09-13 NOTE — Telephone Encounter (Signed)
 Patient called in today and state's that his cath is leaking. Patient is given appointment and voiced understanding.

## 2023-09-13 NOTE — Telephone Encounter (Signed)
 Tried calling patient with no answer to make him aware of appointment time change.Left vm making patient aware of appointment time change.

## 2023-09-14 ENCOUNTER — Ambulatory Visit (INDEPENDENT_AMBULATORY_CARE_PROVIDER_SITE_OTHER)

## 2023-09-14 DIAGNOSIS — R339 Retention of urine, unspecified: Secondary | ICD-10-CM

## 2023-09-14 DIAGNOSIS — Z435 Encounter for attention to cystostomy: Secondary | ICD-10-CM | POA: Diagnosis not present

## 2023-09-14 NOTE — Progress Notes (Signed)
 Patient came in today with cath bag leaking. Cath bag was exchanged. Patient will return in one week for cath replacement with nurse.

## 2023-09-22 ENCOUNTER — Ambulatory Visit

## 2023-09-22 DIAGNOSIS — R339 Retention of urine, unspecified: Secondary | ICD-10-CM | POA: Diagnosis not present

## 2023-09-22 MED ORDER — CIPROFLOXACIN HCL 500 MG PO TABS
500.0000 mg | ORAL_TABLET | Freq: Once | ORAL | Status: AC
  Administered 2023-09-22: 500 mg via ORAL

## 2023-09-22 NOTE — Progress Notes (Signed)
 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  10ml of water  was removed from the balloon, a 16FR foley cath was removed without difficulty.  Patient was cleaned and prepped in a sterile fashion with Betadinex3.  A 16 FR foley cath was replaced into the bladder, no complications were noted. Urine return was noted 10ml and urine was Clear yellow in color. The balloon was filled with 10ml of sterile water . A  bag was attached for drainage.  A night bag was also given to the patient and patient was given instruction on how to change from one bag to another. Patient was given proper instruction on catheter care.    Performed by: Melvenia Stabs. CMA  Follow up: 4 weeks

## 2023-09-27 DIAGNOSIS — Z515 Encounter for palliative care: Secondary | ICD-10-CM | POA: Diagnosis not present

## 2023-09-27 DIAGNOSIS — C3490 Malignant neoplasm of unspecified part of unspecified bronchus or lung: Secondary | ICD-10-CM | POA: Diagnosis not present

## 2023-10-24 ENCOUNTER — Ambulatory Visit

## 2023-10-25 ENCOUNTER — Ambulatory Visit (INDEPENDENT_AMBULATORY_CARE_PROVIDER_SITE_OTHER)

## 2023-10-25 DIAGNOSIS — R338 Other retention of urine: Secondary | ICD-10-CM | POA: Diagnosis not present

## 2023-10-25 DIAGNOSIS — N390 Urinary tract infection, site not specified: Secondary | ICD-10-CM

## 2023-10-25 DIAGNOSIS — R339 Retention of urine, unspecified: Secondary | ICD-10-CM

## 2023-10-25 DIAGNOSIS — N138 Other obstructive and reflux uropathy: Secondary | ICD-10-CM

## 2023-10-25 NOTE — Progress Notes (Signed)
 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  9 ml of water  was removed from the balloon, a 16 FR foley cath was removed without difficulty.  Patient was cleaned and prepped in a sterile fashion with Betadinex3.  A 16  FR foley cath was replaced into the bladder, no complications were noted. Urine return was noted 5 ml and urine was Clear yellow in color. The balloon was filled with 10ml of sterile water . A night bag was attached for drainage.  A night bag was also given to the patient and patient was given instruction on how to change from one bag to another. Patient was given proper instruction on catheter care.    Performed by: Gorden Latino, CMA  Follow up: 4 weeks catheter change

## 2023-11-24 ENCOUNTER — Ambulatory Visit

## 2023-11-24 DIAGNOSIS — R339 Retention of urine, unspecified: Secondary | ICD-10-CM

## 2023-11-24 MED ORDER — CIPROFLOXACIN HCL 500 MG PO TABS
500.0000 mg | ORAL_TABLET | Freq: Once | ORAL | Status: AC
  Administered 2023-11-24: 500 mg via ORAL

## 2023-11-24 NOTE — Progress Notes (Signed)
 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  9 ml of water  was removed from the balloon, a 16 FR foley cath was removed without difficulty.  Patient was cleaned and prepped in a sterile fashion with Betadinex3.  A 16 FR foley cath was replaced into the bladder, no complications were noted. Catheter was flushed with 30mL of sterile water  and urine was Clear yellow in color. The balloon was filled with 10ml of sterile water . A bed bag was attached for drainage.  A night bag was also given to the patient and patient was given instruction on how to change from one bag to another. Patient was given proper instruction on catheter care.    Performed by: Exie DASEN. CMA  Follow up: 4 weeks

## 2023-12-27 ENCOUNTER — Ambulatory Visit

## 2023-12-30 ENCOUNTER — Ambulatory Visit

## 2024-01-02 ENCOUNTER — Ambulatory Visit (INDEPENDENT_AMBULATORY_CARE_PROVIDER_SITE_OTHER)

## 2024-01-02 DIAGNOSIS — R339 Retention of urine, unspecified: Secondary | ICD-10-CM

## 2024-01-02 NOTE — Progress Notes (Signed)
 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  10ml of water  was removed from the balloon, a 16FR foley cath was removed without difficulty.  Patient was cleaned and prepped in a sterile fashion with Betadinex3.  A 16 FR foley cath was replaced into the bladder, no complications were noted. Urine return was noted 15 ml and urine was Clear yellow in color. The balloon was filled with 10ml of sterile water . A night bag was attached for drainage.  A night bag was also given to the patient and patient was given instruction on how to change from one bag to another. Patient was given proper instruction on catheter care.    Performed by: Carlos, CMA  Follow up: 4 weeks Cath Change

## 2024-01-24 DIAGNOSIS — C3490 Malignant neoplasm of unspecified part of unspecified bronchus or lung: Secondary | ICD-10-CM | POA: Diagnosis not present

## 2024-01-24 DIAGNOSIS — Z515 Encounter for palliative care: Secondary | ICD-10-CM | POA: Diagnosis not present

## 2024-02-06 ENCOUNTER — Ambulatory Visit (INDEPENDENT_AMBULATORY_CARE_PROVIDER_SITE_OTHER)

## 2024-02-06 DIAGNOSIS — R339 Retention of urine, unspecified: Secondary | ICD-10-CM | POA: Diagnosis not present

## 2024-02-06 NOTE — Progress Notes (Signed)
 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  10 ml of water  was removed from the balloon, a 16 FR foley cath was removed without difficulty.  Patient was cleaned and prepped in a sterile fashion with Betadinex3.  A 16  FR foley cath was replaced into the bladder, no complications were noted. Urine return was noted 10 ml and urine was Clear yellow in color. The balloon was filled with 10ml of sterile water . A night bag was attached for drainage.  A night bag was also given to the patient and patient was given instruction on how to change from one bag to another. Patient was given proper instruction on catheter care.    Performed by: Carlos, CMA  Follow up: Keep Monthly Cath Change

## 2024-03-12 ENCOUNTER — Ambulatory Visit (INDEPENDENT_AMBULATORY_CARE_PROVIDER_SITE_OTHER)

## 2024-03-12 ENCOUNTER — Ambulatory Visit

## 2024-03-12 DIAGNOSIS — R339 Retention of urine, unspecified: Secondary | ICD-10-CM

## 2024-03-12 MED ORDER — CIPROFLOXACIN HCL 500 MG PO TABS
500.0000 mg | ORAL_TABLET | Freq: Once | ORAL | Status: AC
  Administered 2024-03-12: 500 mg via ORAL

## 2024-03-12 NOTE — Progress Notes (Signed)
 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  10 ml of water  was removed from the balloon, a 16 FR foley cath was removed without difficulty.  Patient was cleaned and prepped in a sterile fashion with Betadinex3.  A 16  FR foley cath was replaced into the bladder, no complications were noted. Urine return was noted 10 ml and urine was Clear yellow in color. The balloon was filled with 10ml of sterile water . A night bag was attached for drainage.  A night bag was also given to the patient and patient was given instruction on how to change from one bag to another. Patient was given proper instruction on catheter care.    Performed by: Carlos, CMA  Follow up: 4 weeks Cath Change

## 2024-04-10 ENCOUNTER — Ambulatory Visit

## 2024-04-16 ENCOUNTER — Ambulatory Visit

## 2024-04-16 DIAGNOSIS — R339 Retention of urine, unspecified: Secondary | ICD-10-CM

## 2024-04-16 MED ORDER — CIPROFLOXACIN HCL 500 MG PO TABS
500.0000 mg | ORAL_TABLET | Freq: Once | ORAL | Status: AC
  Administered 2024-04-16: 500 mg via ORAL

## 2024-04-16 NOTE — Progress Notes (Signed)
 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  10 ml of water  was removed from the balloon, a 16 FR foley cath was removed without difficulty.  Patient was cleaned and prepped in a sterile fashion with Betadinex3.  A 16  FR foley cath was replaced into the bladder, no complications were noted. Urine return was noted 5 ml and urine was Clear yellow in color. The balloon was filled with 10ml of sterile water . A night bag was attached for drainage.  A night bag was also given to the patient and patient was given instruction on how to change from one bag to another. Patient was given proper instruction on catheter care.    Performed by: Carlos, CMA  Follow up: 4 weeks cath Change

## 2024-05-21 ENCOUNTER — Ambulatory Visit

## 2024-05-21 DIAGNOSIS — R339 Retention of urine, unspecified: Secondary | ICD-10-CM

## 2024-05-21 MED ORDER — CIPROFLOXACIN HCL 500 MG PO TABS
500.0000 mg | ORAL_TABLET | Freq: Once | ORAL | Status: AC
  Administered 2024-05-21: 500 mg via ORAL

## 2024-05-21 NOTE — Progress Notes (Addendum)
 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  10 ml of water  was removed from the balloon, a 16 FR foley cath was removed without difficulty.  Patient was cleaned and prepped in a sterile fashion with Betadinex3.  A 16  FR foley cath was replaced into the bladder, no complications were noted. Urine return was noted 10 ml and urine was Clear yellow in color. The balloon was filled with 10ml of sterile water . A night bag was attached for drainage.  A night bag was also given to the patient and patient was given instruction on how to change from one bag to another. Patient was given proper instruction on catheter care.    Performed by: Carlos, CMA  Follow up: 4 weeks Cath change

## 2024-06-18 ENCOUNTER — Ambulatory Visit

## 2024-06-22 ENCOUNTER — Ambulatory Visit

## 2024-06-22 DIAGNOSIS — R339 Retention of urine, unspecified: Secondary | ICD-10-CM

## 2024-06-22 MED ORDER — CIPROFLOXACIN HCL 500 MG PO TABS
500.0000 mg | ORAL_TABLET | Freq: Once | ORAL | Status: AC
  Administered 2024-06-22: 500 mg via ORAL

## 2024-06-22 NOTE — Progress Notes (Cosign Needed Addendum)
 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  10 ml of water  was removed from the balloon, a 16 FR foley cath was removed without difficulty.  Patient was cleaned and prepped in a sterile fashion with Betadinex3.  A 16  FR foley cath was replaced into the bladder, no complications were noted. Urine return was noted 10 ml and urine was little bloody in color. The balloon was filled with 10ml of sterile water . A night  bag was attached for drainage.  A night bag was also given to the patient and patient was given instruction on how to change from one bag to another. Patient was given proper instruction on catheter care.    Performed by: [name], CMA  Follow up: 4 weeks Cath Change

## 2024-07-20 ENCOUNTER — Ambulatory Visit

## 2024-07-30 ENCOUNTER — Ambulatory Visit
